# Patient Record
Sex: Female | Born: 1970 | Race: Black or African American | Hispanic: No | Marital: Single | Smoking: Never smoker
Health system: Southern US, Community
[De-identification: ages and names within clinical notes are randomized; demographics above are authoritative.]

## PROBLEM LIST (undated history)

## (undated) ENCOUNTER — Inpatient Hospital Stay: Admission: EM | Payer: Self-pay

## (undated) ENCOUNTER — Telehealth

## (undated) ENCOUNTER — Encounter

## (undated) ENCOUNTER — Encounter
Payer: MEDICARE | Attending: Rehabilitative and Restorative Service Providers" | Primary: Rehabilitative and Restorative Service Providers"

## (undated) ENCOUNTER — Encounter
Attending: Student in an Organized Health Care Education/Training Program | Primary: Student in an Organized Health Care Education/Training Program

## (undated) ENCOUNTER — Ambulatory Visit: Payer: Medicare (Managed Care) | Attending: Neurology | Primary: Neurology

## (undated) ENCOUNTER — Telehealth: Attending: Clinical | Primary: Clinical

## (undated) ENCOUNTER — Ambulatory Visit

## (undated) ENCOUNTER — Ambulatory Visit: Payer: MEDICARE

## (undated) ENCOUNTER — Encounter: Attending: Neurology | Primary: Neurology

## (undated) ENCOUNTER — Telehealth
Attending: Student in an Organized Health Care Education/Training Program | Primary: Student in an Organized Health Care Education/Training Program

## (undated) ENCOUNTER — Encounter
Attending: Pharmacist Clinician (PhC)/ Clinical Pharmacy Specialist | Primary: Pharmacist Clinician (PhC)/ Clinical Pharmacy Specialist

## (undated) ENCOUNTER — Non-Acute Institutional Stay
Payer: MEDICARE | Attending: Student in an Organized Health Care Education/Training Program | Primary: Student in an Organized Health Care Education/Training Program

## (undated) ENCOUNTER — Inpatient Hospital Stay

## (undated) ENCOUNTER — Ambulatory Visit: Attending: Mental Health | Primary: Mental Health

## (undated) ENCOUNTER — Encounter: Attending: Internal Medicine | Primary: Internal Medicine

## (undated) ENCOUNTER — Ambulatory Visit
Payer: Medicare (Managed Care) | Attending: Student in an Organized Health Care Education/Training Program | Primary: Student in an Organized Health Care Education/Training Program

## (undated) ENCOUNTER — Encounter: Attending: Clinical | Primary: Clinical

## (undated) ENCOUNTER — Other Ambulatory Visit

## (undated) ENCOUNTER — Ambulatory Visit: Payer: Medicare (Managed Care)

## (undated) ENCOUNTER — Encounter: Payer: MEDICARE | Attending: Neurology | Primary: Neurology

## (undated) ENCOUNTER — Telehealth: Attending: Nurse Practitioner | Primary: Nurse Practitioner

## (undated) ENCOUNTER — Encounter: Attending: Physician Assistant | Primary: Physician Assistant

## (undated) ENCOUNTER — Encounter: Attending: Urology | Primary: Urology

## (undated) ENCOUNTER — Encounter: Attending: Addiction (Substance Use Disorder) | Primary: Addiction (Substance Use Disorder)

## (undated) ENCOUNTER — Ambulatory Visit: Payer: Medicare (Managed Care) | Attending: Internal Medicine | Primary: Internal Medicine

## (undated) ENCOUNTER — Ambulatory Visit: Payer: MEDICARE | Attending: Physician Assistant | Primary: Physician Assistant

## (undated) ENCOUNTER — Ambulatory Visit: Attending: Pharmacist | Primary: Pharmacist

## (undated) ENCOUNTER — Ambulatory Visit: Attending: Addiction (Substance Use Disorder) | Primary: Addiction (Substance Use Disorder)

## (undated) ENCOUNTER — Telehealth
Attending: Pharmacist Clinician (PhC)/ Clinical Pharmacy Specialist | Primary: Pharmacist Clinician (PhC)/ Clinical Pharmacy Specialist

## (undated) ENCOUNTER — Ambulatory Visit
Payer: MEDICARE | Attending: Rehabilitative and Restorative Service Providers" | Primary: Rehabilitative and Restorative Service Providers"

## (undated) ENCOUNTER — Telehealth: Attending: Internal Medicine | Primary: Internal Medicine

## (undated) ENCOUNTER — Telehealth: Attending: Urology | Primary: Urology

## (undated) ENCOUNTER — Ambulatory Visit: Payer: MEDICARE | Attending: Medical | Primary: Medical

## (undated) DIAGNOSIS — IMO0002 Reserved for concepts with insufficient information to code with codable children: Secondary | ICD-10-CM

## (undated) DIAGNOSIS — M792 Neuralgia and neuritis, unspecified: Secondary | ICD-10-CM

## (undated) DIAGNOSIS — K592 Neurogenic bowel, not elsewhere classified: Secondary | ICD-10-CM

## (undated) DIAGNOSIS — R600 Localized edema: Secondary | ICD-10-CM

## (undated) DIAGNOSIS — N319 Neuromuscular dysfunction of bladder, unspecified: Secondary | ICD-10-CM

## (undated) DIAGNOSIS — G35 Multiple sclerosis: Secondary | ICD-10-CM

## (undated) DIAGNOSIS — K439 Ventral hernia without obstruction or gangrene: Secondary | ICD-10-CM

---

## 1999-09-27 HISTORY — PX: ABDOMINAL HYSTERECTOMY: SHX81

## 2002-09-26 HISTORY — PX: BACK SURGERY: SHX140

## 2002-10-29 ENCOUNTER — Encounter: Admission: RE | Admit: 2002-10-29 | Discharge: 2002-10-29 | Payer: Self-pay | Admitting: Neurosurgery

## 2002-10-29 ENCOUNTER — Encounter: Payer: Self-pay | Admitting: Neurosurgery

## 2002-12-18 ENCOUNTER — Inpatient Hospital Stay (HOSPITAL_COMMUNITY): Admission: RE | Admit: 2002-12-18 | Discharge: 2002-12-21 | Payer: Self-pay | Admitting: Neurosurgery

## 2002-12-18 ENCOUNTER — Encounter: Payer: Self-pay | Admitting: Neurosurgery

## 2003-01-23 ENCOUNTER — Encounter: Payer: Self-pay | Admitting: Neurosurgery

## 2003-01-23 ENCOUNTER — Encounter: Admission: RE | Admit: 2003-01-23 | Discharge: 2003-01-23 | Payer: Self-pay | Admitting: Neurosurgery

## 2003-02-11 ENCOUNTER — Encounter: Admission: RE | Admit: 2003-02-11 | Discharge: 2003-02-11 | Payer: Self-pay | Admitting: Neurosurgery

## 2003-02-11 ENCOUNTER — Encounter: Payer: Self-pay | Admitting: Neurosurgery

## 2003-04-30 ENCOUNTER — Encounter: Admission: RE | Admit: 2003-04-30 | Discharge: 2003-04-30 | Payer: Self-pay | Admitting: Neurosurgery

## 2003-04-30 ENCOUNTER — Encounter: Payer: Self-pay | Admitting: Neurosurgery

## 2003-09-27 DIAGNOSIS — G35 Multiple sclerosis: Secondary | ICD-10-CM

## 2003-09-27 HISTORY — DX: Multiple sclerosis: G35

## 2003-11-18 ENCOUNTER — Encounter: Admission: RE | Admit: 2003-11-18 | Discharge: 2003-11-18 | Payer: Self-pay | Admitting: Neurosurgery

## 2003-12-11 ENCOUNTER — Ambulatory Visit (HOSPITAL_COMMUNITY): Admission: RE | Admit: 2003-12-11 | Discharge: 2003-12-11 | Payer: Self-pay | Admitting: Neurosurgery

## 2004-05-05 ENCOUNTER — Encounter: Admission: RE | Admit: 2004-05-05 | Discharge: 2004-05-05 | Payer: Self-pay | Admitting: Neurosurgery

## 2004-07-07 ENCOUNTER — Ambulatory Visit (HOSPITAL_COMMUNITY): Admission: RE | Admit: 2004-07-07 | Discharge: 2004-07-07 | Payer: Self-pay | Admitting: Neurology

## 2004-12-05 ENCOUNTER — Emergency Department (HOSPITAL_COMMUNITY): Admission: EM | Admit: 2004-12-05 | Discharge: 2004-12-05 | Payer: Self-pay | Admitting: Emergency Medicine

## 2004-12-30 ENCOUNTER — Ambulatory Visit: Payer: Self-pay | Admitting: Neurology

## 2005-06-15 ENCOUNTER — Emergency Department: Payer: Self-pay | Admitting: Internal Medicine

## 2005-09-11 ENCOUNTER — Inpatient Hospital Stay: Payer: Self-pay

## 2005-09-24 ENCOUNTER — Inpatient Hospital Stay: Payer: Self-pay

## 2006-01-23 DIAGNOSIS — N319 Neuromuscular dysfunction of bladder, unspecified: Secondary | ICD-10-CM | POA: Diagnosis present

## 2006-01-23 HISTORY — DX: Neuromuscular dysfunction of bladder, unspecified: N31.9

## 2006-03-01 ENCOUNTER — Emergency Department: Payer: Self-pay | Admitting: Emergency Medicine

## 2006-03-15 ENCOUNTER — Inpatient Hospital Stay: Payer: Self-pay | Admitting: Internal Medicine

## 2006-03-21 ENCOUNTER — Other Ambulatory Visit: Payer: Self-pay

## 2006-03-21 ENCOUNTER — Emergency Department: Payer: Self-pay | Admitting: Unknown Physician Specialty

## 2006-04-19 ENCOUNTER — Ambulatory Visit: Payer: Self-pay | Admitting: Neurology

## 2006-09-04 ENCOUNTER — Emergency Department: Payer: Self-pay | Admitting: Emergency Medicine

## 2006-09-29 ENCOUNTER — Other Ambulatory Visit: Payer: Self-pay

## 2006-09-29 ENCOUNTER — Emergency Department: Payer: Self-pay | Admitting: Unknown Physician Specialty

## 2006-10-13 ENCOUNTER — Ambulatory Visit: Payer: Self-pay | Admitting: Neurology

## 2006-11-03 ENCOUNTER — Encounter (HOSPITAL_COMMUNITY): Admission: RE | Admit: 2006-11-03 | Discharge: 2007-02-01 | Payer: Self-pay | Admitting: Neurology

## 2007-02-22 ENCOUNTER — Encounter (HOSPITAL_COMMUNITY): Admission: RE | Admit: 2007-02-22 | Discharge: 2007-05-23 | Payer: Self-pay | Admitting: Neurology

## 2007-04-04 ENCOUNTER — Ambulatory Visit: Payer: Self-pay | Admitting: Neurology

## 2007-06-10 ENCOUNTER — Emergency Department: Payer: Self-pay | Admitting: Emergency Medicine

## 2007-06-14 ENCOUNTER — Encounter (HOSPITAL_COMMUNITY): Admission: RE | Admit: 2007-06-14 | Discharge: 2007-09-12 | Payer: Self-pay | Admitting: Neurology

## 2007-07-26 IMAGING — CT CT HEAD WITHOUT CONTRAST
1 series · 16 of 30 positions shown, 20 images · non-contrast
Comparison: none

REASON FOR EXAM: Weakness left arm and leg, ms
COMMENTS:

PROCEDURE:     CT  - CT HEAD WITHOUT CONTRAST  - September 29, 2006  [DATE]
RESULT:     Unenhanced, emergent head CT is performed for LEFT leg weakness.

[Series 2: soft tissue · axial · 0.41mm/px · z∈[+336,+470]mm · 16 of 31 slices shown, 20 images]
[im 2/31  brain]
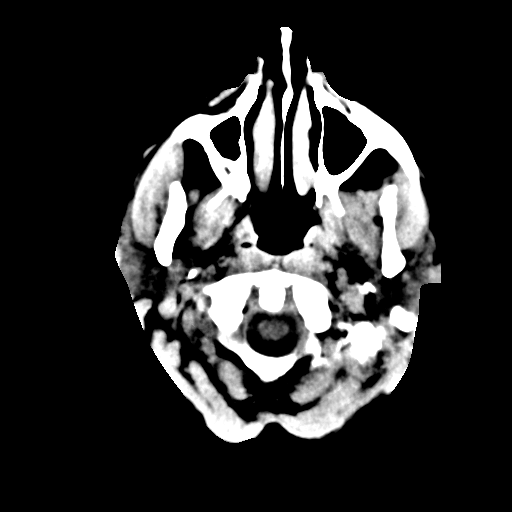
[im 2/31  bone]
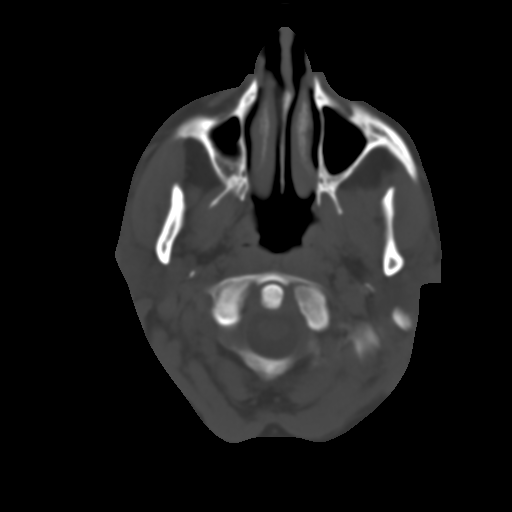
[im 4/31  brain]
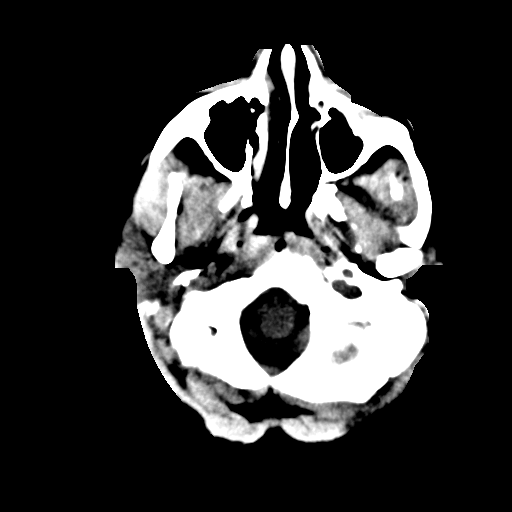
[im 6/31  brain]
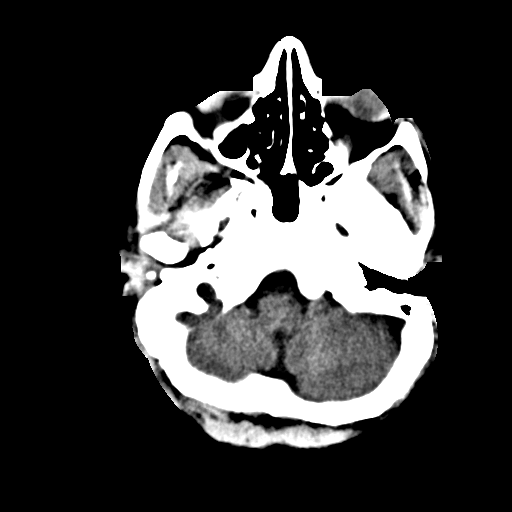
[im 8/31  brain]
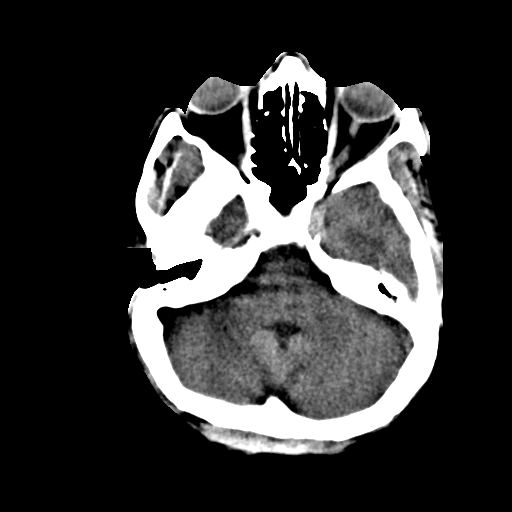
[im 9/31  brain]
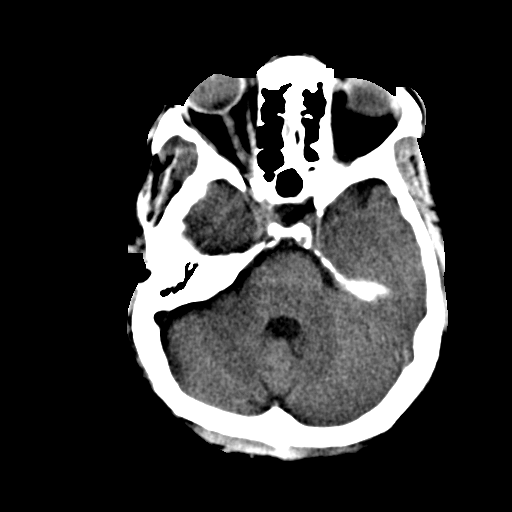
[im 9/31  bone]
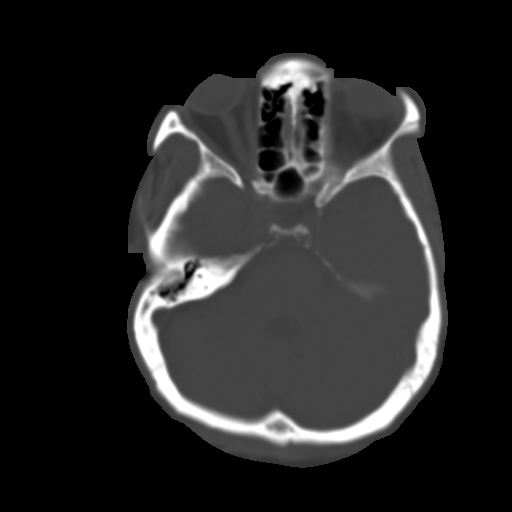
[im 11/31  brain]
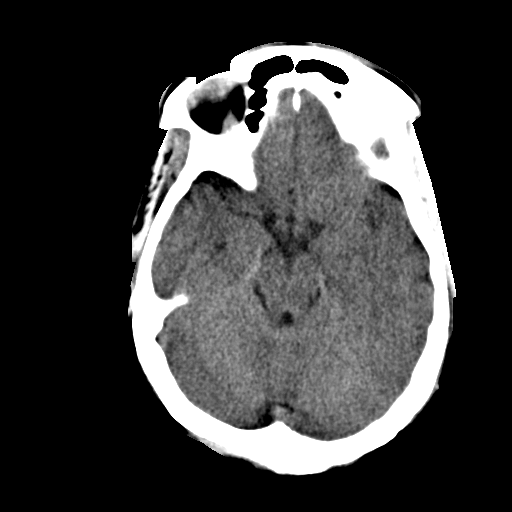
[im 13/31  brain]
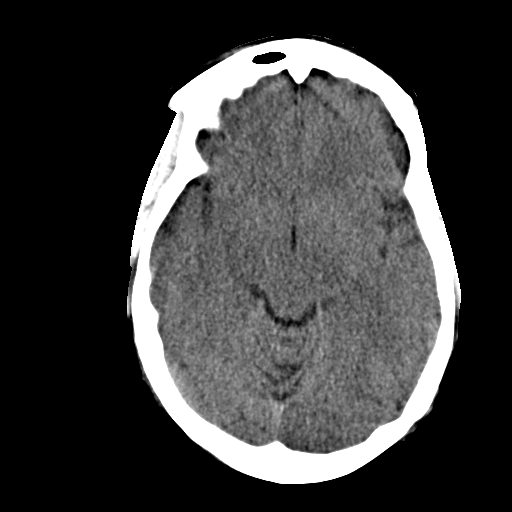
[im 15/31  brain]
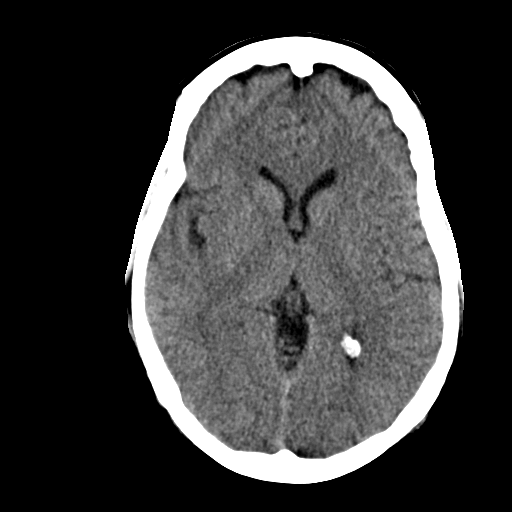
[im 16/31  brain]
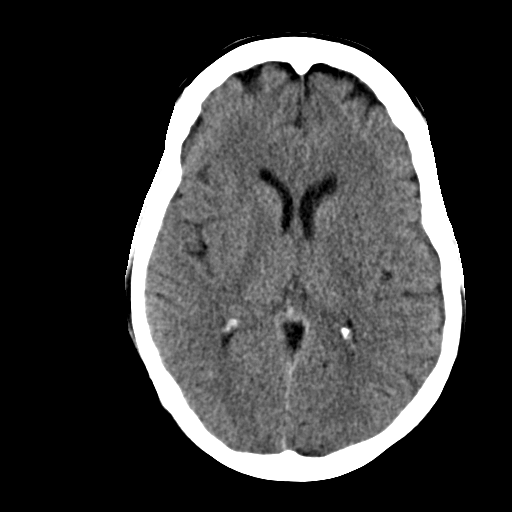
[im 16/31  bone]
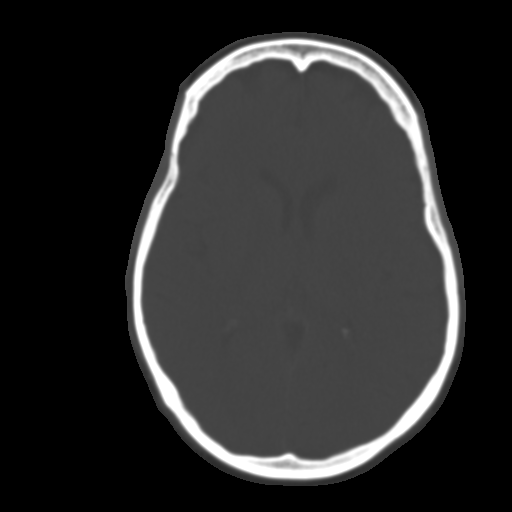
[im 18/31  brain]
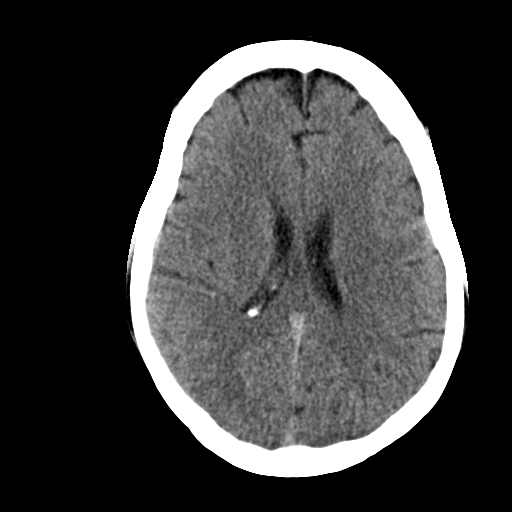
[im 20/31  brain]
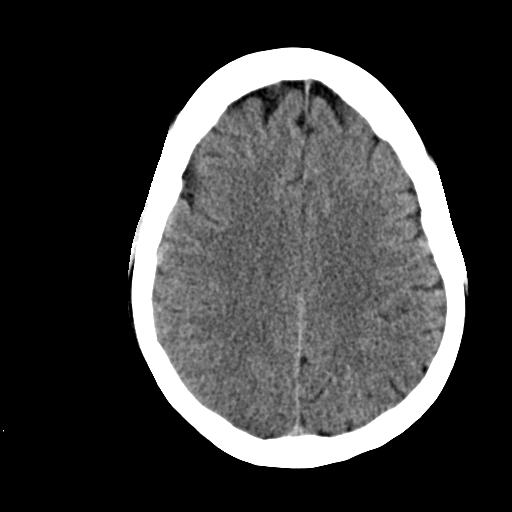
[im 22/31  brain]
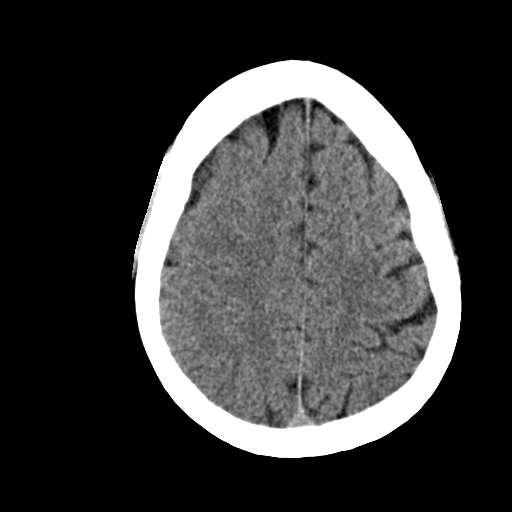
[im 23/31  brain]
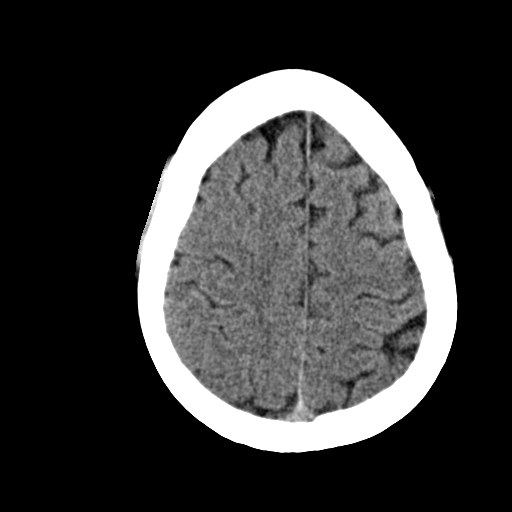
[im 23/31  bone]
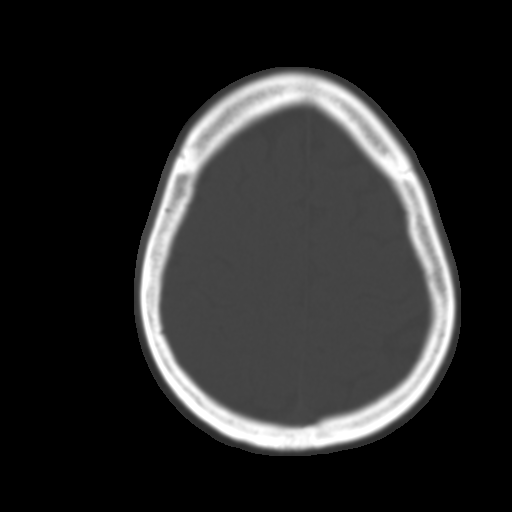
[im 25/31  brain]
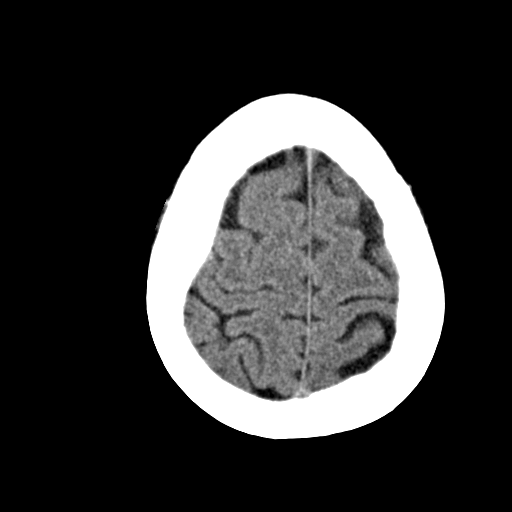
[im 27/31  brain]
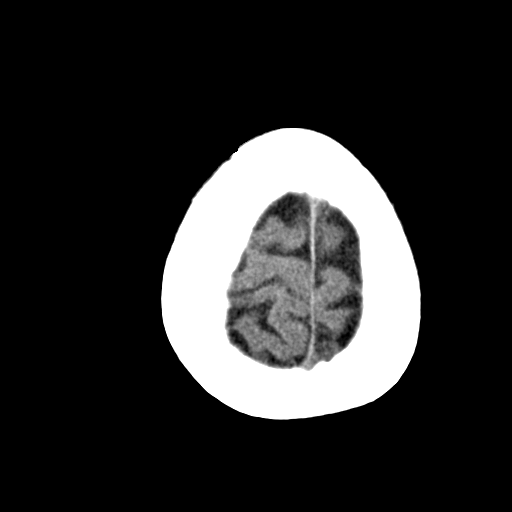
[im 29/31  brain]
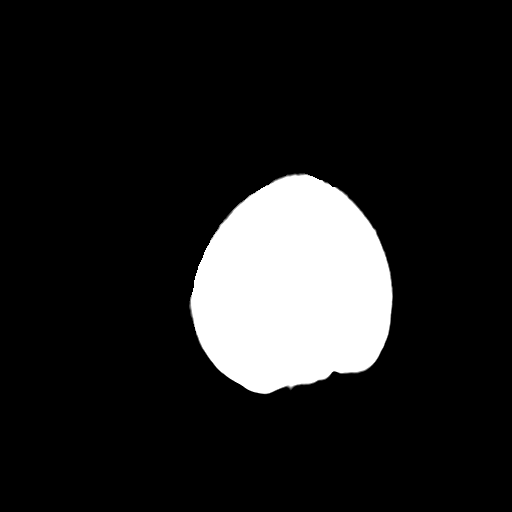

[16 of 30 positions shown; findings below may reference images not displayed]

FINDINGS: No intracerebral bleeds are noted. No infarcts or mass effect is
seen. There is no shift of the midline. The ventricles appear within normal
limits. No extra-axial fluid collections are identified.

The sinuses appear clear.
IMPRESSION: No acute abnormality is noted on the unenhanced head CT.

The report was called to the Emergency Room at the conclusion of the
dictation.

## 2007-09-13 ENCOUNTER — Encounter (HOSPITAL_COMMUNITY): Admission: RE | Admit: 2007-09-13 | Discharge: 2007-09-26 | Payer: Self-pay | Admitting: Neurology

## 2007-09-27 ENCOUNTER — Encounter (HOSPITAL_COMMUNITY): Admission: RE | Admit: 2007-09-27 | Discharge: 2007-11-24 | Payer: Self-pay | Admitting: Neurology

## 2007-10-06 ENCOUNTER — Ambulatory Visit: Payer: Self-pay | Admitting: Neurology

## 2007-11-25 ENCOUNTER — Encounter (HOSPITAL_COMMUNITY): Admission: RE | Admit: 2007-11-25 | Discharge: 2008-02-23 | Payer: Self-pay | Admitting: Family Medicine

## 2008-02-21 ENCOUNTER — Inpatient Hospital Stay: Payer: Self-pay | Admitting: Internal Medicine

## 2008-03-14 ENCOUNTER — Encounter (HOSPITAL_COMMUNITY): Admission: RE | Admit: 2008-03-14 | Discharge: 2008-06-12 | Payer: Self-pay | Admitting: Neurology

## 2008-03-26 ENCOUNTER — Ambulatory Visit: Payer: Self-pay | Admitting: Neurology

## 2008-05-28 ENCOUNTER — Ambulatory Visit (HOSPITAL_COMMUNITY): Admission: RE | Admit: 2008-05-28 | Discharge: 2008-05-28 | Payer: Self-pay | Admitting: Neurology

## 2008-07-04 ENCOUNTER — Encounter (HOSPITAL_COMMUNITY): Admission: RE | Admit: 2008-07-04 | Discharge: 2008-10-02 | Payer: Self-pay | Admitting: Neurology

## 2008-08-29 ENCOUNTER — Ambulatory Visit: Payer: Self-pay | Admitting: Neurology

## 2008-11-13 ENCOUNTER — Encounter (HOSPITAL_COMMUNITY): Admission: RE | Admit: 2008-11-13 | Discharge: 2009-02-11 | Payer: Self-pay | Admitting: Neurology

## 2008-11-28 ENCOUNTER — Ambulatory Visit (HOSPITAL_COMMUNITY): Admission: RE | Admit: 2008-11-28 | Discharge: 2008-11-28 | Payer: Self-pay | Admitting: Neurology

## 2008-12-02 ENCOUNTER — Ambulatory Visit (HOSPITAL_COMMUNITY): Admission: RE | Admit: 2008-12-02 | Discharge: 2008-12-02 | Payer: Self-pay | Admitting: Interventional Radiology

## 2009-02-12 ENCOUNTER — Encounter (HOSPITAL_COMMUNITY): Admission: RE | Admit: 2009-02-12 | Discharge: 2009-05-13 | Payer: Self-pay | Admitting: Neurology

## 2009-02-25 ENCOUNTER — Encounter: Admission: RE | Admit: 2009-02-25 | Discharge: 2009-02-25 | Payer: Self-pay | Admitting: Neurology

## 2009-03-10 ENCOUNTER — Ambulatory Visit (HOSPITAL_COMMUNITY): Admission: RE | Admit: 2009-03-10 | Discharge: 2009-03-10 | Payer: Self-pay | Admitting: General Surgery

## 2009-04-03 ENCOUNTER — Ambulatory Visit (HOSPITAL_COMMUNITY): Admission: RE | Admit: 2009-04-03 | Discharge: 2009-04-03 | Payer: Self-pay | Admitting: General Surgery

## 2009-05-21 ENCOUNTER — Encounter (HOSPITAL_COMMUNITY): Admission: RE | Admit: 2009-05-21 | Discharge: 2009-08-19 | Payer: Self-pay | Admitting: Neurology

## 2009-08-21 ENCOUNTER — Encounter (HOSPITAL_COMMUNITY): Admission: RE | Admit: 2009-08-21 | Discharge: 2009-09-25 | Payer: Self-pay | Admitting: Neurology

## 2009-09-11 ENCOUNTER — Ambulatory Visit: Payer: Self-pay | Admitting: Neurology

## 2009-10-22 ENCOUNTER — Encounter (HOSPITAL_COMMUNITY): Admission: RE | Admit: 2009-10-22 | Discharge: 2010-01-20 | Payer: Self-pay | Admitting: Neurology

## 2010-02-04 ENCOUNTER — Encounter (HOSPITAL_COMMUNITY): Admission: RE | Admit: 2010-02-04 | Discharge: 2010-05-05 | Payer: Self-pay | Admitting: Neurology

## 2010-04-22 ENCOUNTER — Ambulatory Visit: Payer: Self-pay | Admitting: Neurology

## 2010-05-28 ENCOUNTER — Encounter (HOSPITAL_COMMUNITY)
Admission: RE | Admit: 2010-05-28 | Discharge: 2010-08-26 | Payer: Self-pay | Source: Home / Self Care | Admitting: Neurology

## 2010-08-30 ENCOUNTER — Encounter (HOSPITAL_COMMUNITY)
Admission: RE | Admit: 2010-08-30 | Discharge: 2010-10-26 | Payer: Self-pay | Source: Home / Self Care | Attending: Neurology | Admitting: Neurology

## 2010-09-26 ENCOUNTER — Emergency Department: Payer: Self-pay | Admitting: Emergency Medicine

## 2010-11-04 ENCOUNTER — Encounter (HOSPITAL_COMMUNITY): Payer: Medicare Other | Attending: Neurology

## 2010-11-04 DIAGNOSIS — G35 Multiple sclerosis: Secondary | ICD-10-CM | POA: Insufficient documentation

## 2010-12-07 ENCOUNTER — Encounter (HOSPITAL_COMMUNITY): Payer: Medicare Other | Attending: Neurology

## 2010-12-07 DIAGNOSIS — G35 Multiple sclerosis: Secondary | ICD-10-CM | POA: Insufficient documentation

## 2011-01-02 LAB — BASIC METABOLIC PANEL
BUN: 14 mg/dL (ref 6–23)
CO2: 27 mEq/L (ref 19–32)
Calcium: 9.2 mg/dL (ref 8.4–10.5)
Chloride: 107 mEq/L (ref 96–112)
Creatinine, Ser: 0.69 mg/dL (ref 0.4–1.2)
GFR calc Af Amer: >60 mL/min (ref >60)
GFR calc non Af Amer: >60 mL/min (ref >60)
Glucose, Bld: 91 mg/dL (ref 70–99)
Potassium: 4.2 mEq/L (ref 3.5–5.1)
Sodium: 140 mEq/L (ref 135–145)

## 2011-01-02 LAB — HEMOGLOBIN AND HEMATOCRIT, BLOOD
HCT: 39.6 % (ref 36.0–46.0)
Hemoglobin: 13.2 g/dL (ref 12.0–15.0)

## 2011-01-03 LAB — CBC
HCT: 40 % (ref 36.0–46.0)
Hemoglobin: 13.7 g/dL (ref 12.0–15.0)
MCHC: 34.3 g/dL (ref 30.0–36.0)
MCV: 88.4 fL (ref 78.0–100.0)
Platelets: 282 10*3/uL (ref 150–400)
RBC: 4.52 MIL/uL (ref 3.87–5.11)
RDW: 13.6 % (ref 11.5–15.5)
WBC: 10.9 10*3/uL — ABNORMAL HIGH (ref 4.0–10.5)

## 2011-01-03 LAB — CULTURE, BLOOD (SINGLE)

## 2011-01-04 ENCOUNTER — Encounter (HOSPITAL_COMMUNITY)
Admission: RE | Admit: 2011-01-04 | Discharge: 2011-01-04 | Disposition: A | Discharge status: Home / Self Care | Payer: Medicare Other | Source: Ambulatory Visit | Attending: Neurology | Admitting: Neurology

## 2011-01-04 DIAGNOSIS — G35 Multiple sclerosis: Secondary | ICD-10-CM | POA: Insufficient documentation

## 2011-01-04 LAB — CBC
HCT: 37.5 % (ref 36.0–46.0)
MCV: 88.4 fL (ref 78.0–100.0)
Platelets: 253 10*3/uL (ref 150–400)
RBC: 4.24 MIL/uL (ref 3.87–5.11)
WBC: 8.6 10*3/uL (ref 4.0–10.5)

## 2011-01-04 LAB — DIFFERENTIAL
Basophils Absolute: 0.1 10*3/uL (ref 0.0–0.1)
Basophils Relative: 1 % (ref 0–1)
Eosinophils Absolute: 0.2 10*3/uL (ref 0.0–0.7)
Eosinophils Relative: 2 % (ref 0–5)
Monocytes Absolute: 1.1 10*3/uL — ABNORMAL HIGH (ref 0.1–1.0)

## 2011-01-04 LAB — CULTURE, BLOOD (ROUTINE X 2): Culture: NO GROWTH

## 2011-01-04 LAB — COMPREHENSIVE METABOLIC PANEL
AST: 20 U/L (ref 0–37)
Albumin: 3.6 g/dL (ref 3.5–5.2)
Alkaline Phosphatase: 43 U/L (ref 39–117)
BUN: 9 mg/dL (ref 6–23)
CO2: 26 mEq/L (ref 19–32)
Chloride: 107 mEq/L (ref 96–112)
GFR calc Af Amer: >60 mL/min (ref >60)
GFR calc non Af Amer: >60 mL/min (ref >60)
Potassium: 3.5 mEq/L (ref 3.5–5.1)
Total Bilirubin: 0.6 mg/dL (ref 0.3–1.2)

## 2011-01-06 LAB — APTT: aPTT: 29 seconds (ref 24–37)

## 2011-01-06 LAB — PROTIME-INR: INR: 1 (ref 0.00–1.49)

## 2011-02-01 ENCOUNTER — Encounter (HOSPITAL_COMMUNITY): Payer: Medicare Other

## 2011-02-08 NOTE — Op Note (Signed)
[name], [name]              ACCOUNT NO.:  [id number]   MEDICAL RECORD NO.:  [id number]          PATIENT TYPE:  AMB   LOCATION:  DAY                          FACILITY:  [facility]   PHYSICIAN:  Lennie Muckle, MD      DATE OF BIRTH:  11-28-1970   DATE OF PROCEDURE:  04/03/2009  DATE OF DISCHARGE:                               OPERATIVE REPORT   PREOPERATIVE DIAGNOSIS:  Multiple sclerosis needing port access for  therapy.   POSTOPERATIVE DIAGNOSIS:  Multiple sclerosis needing port access for  therapy.   PROCEDURE:  Port placement.   SURGEON:  Lennie Muckle, MD   ASSISTANT:  OR technician.   ANESTHESIA:  General endotracheal anesthesia.   FINDINGS:  Catheter and left subclavian tip in left atrium.  No  immediate complications.  A Port-A-Cath was placed without difficulty.   ESTIMATED BLOOD LOSS:  None.   COMPLICATIONS:  None immediate.   INDICATIONS FOR PROCEDURE:  [name] is a 40 year old female with  multiple sclerosis receiving IV infusions for treatment.  She had had  infection of her previous port which had to be removed.  She had  followup blood cultures which were negative for any bacteria.  I had  talked to her about placing the port on the opposite side.  Risk of  surgery were explained to the patient.  Informed consent was obtained  prior to procedure.   DETAILS OF THE PROCEDURE:  [name] was identified in the preoperative  holding area.  She received a gram of IV Kefzol.  She was then taken to  the operating room and once in the operating room placed in a supine  position.  SCDs were applied to her lower extremities.  She was placed  under general tracheal anesthesia.  Her arms were tucked and she had a  towel roll between her shoulder blades.  Her anterior chest wall right  and left side were then prepped and draped in the usual sterile fashion.  A time-out procedure of the patient and procedure were performed.  Using  the Seldinger technique I accessed  the left subclavian with the seeker  needle.  Thread the wire with fluoroscopy in position in the heart.  I  then used dilator technique and introduced the catheter into the  subclavian.  This was positioned in the heart.  Retracted distally until  the tip was in the right atrium.  I then created a pocket in the  anterior chest wall using electrocautery after cutting the skin with a  #15 blade.  I then cut the catheter down to size.  I tunneled this  through subcutaneous tissues to the port.  Final x-ray revealed the tip  at the atrium.  I  then secured the port in place with 2-0 Prolene suture, closed in  layered fashion using 3-0 Vicryl suture.  The patient had Dermabond  placed for final dressing.  She will have a chest x-ray in the recovery  unit and be discharged home.  Will follow up in approximately two weeks'  time.      Amber Burna Mortimer,  MD  Electronically Signed     ALA/MEDQ  D:  04/03/2009  T:  04/03/2009  Job:  [id number]   cc:   Casimiro Needle L. Thad Ranger, M.D.  Fax: [phone]

## 2011-02-11 NOTE — Op Note (Signed)
NAMEKIMBERLIN, [name]                          ACCOUNT NO.:  [id number]   MEDICAL RECORD NO.:  [id number]                   PATIENT TYPE:  OIB   LOCATION:  3172                                 FACILITY:  [facility]   PHYSICIAN:  Kathaleen Maser. Pool, M.D.                 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   PROCEDURE:  L5-S1 bilateral microdiskectomies with posterior lumbar  interbody fusion utilizing Tangent wedges and local autograft, coupled with  posterolateral fusion utilizing pedicle screw fixation and local autograft.   SURGEON:  Kathaleen Maser. Pool, M.D.   ASSISTANT:  Donalee Citrin, M.D.   ANESTHESIA:  General endotracheal.   INDICATIONS:  The patient is a 40 year old female with history of severe  back and bilateral lower extremity pain failing all conservative management.  MRI scan demonstrates evidence of a central L5-S1 disk herniation with  significant disk degeneration.  Discography reveals a concordant pain  response at this level and negative response at all other levels.  The  patient has been counseled as to her options.  She has decided to proceed  with an L5-S1 decompression and fusion with instrumentation for hopeful  improvement of her symptoms.   DESCRIPTION OF PROCEDURE:  The patient was taken to the operating room and  placed on the table in a supine position.  After an adequate level of  anesthesia was achieved, the patient was positioned prone onto the Wilson  frame and appropriately padded.  The patient's lumbar region was prepped and  draped sterilely.  A 10 blade was used to make a linear skin incision  overlying the L5-S1 interspace.  This was carried  down sharply in the  midline.  Subperiosteal dissection was then performed, exposing the laminae  and facet joints at L5 and S1 bilaterally as well as the transverse  processes of L5 and the alae of the sacrum bilaterally.  Deep self-retaining  retractor was placed, intraoperative fluoroscopy was used, and the levels  were confirmed.  A complete laminectomy at L5 was then performed using  Kerrison rongeurs, Leksell rongeurs, and a high-speed drill to remove the  entire lamina of L5 and the entire inferior facet complex of L5 bilaterally  and the majority of the superior facet complex of S1 bilaterally.  Ligamentum flavum was elevated and resected in piecemeal fashion, as was the  superior rim of the S1 lamina.  Underlying thecal sac and exiting L5 and S1  nerve roots were identified.  Wide foraminotomies were performed along the  course of the exiting nerve roots.  Epidural venous plexus was coagulated  and cut.  Starting first  at the patient's left side, the thecal sac and  nerve roots were mobilized and retracted toward the midline.  The disk space  was readily apparent.  This was incised with a 15 blade in a rectangular  fashion.  A wide disk space clean-out was then achieved using pituitary  rongeurs, upward-angled pituitary rongeurs, and Epstein curettes.  A large  central disk herniation was encountered and completely resected.  The  procedure then repeated on the contralateral side, again without  complications.  The disk space was then sequentially dilated up to 10 mm  with a 10 mm distractor left in the patient's left side.  With a nerve root  protector on the patient's right side, the disk space was then cut with a 10  mm box cutter and then cut with a 10 mm Tangent chisel.  Soft tissue was  removed from the interspace.  A 10 x 24 mm Tangent wedge was then impacted  in place, recessed approximately 1 mm from the posterior cortical margin.  The procedure was then repeated on the  contralateral side, again without  complication.  Prior to installation of the second wedge, the disk space was  further curetted and morcellized autograft was packed in the interspace.  A  second wedge was then impacted into place and again recessed approximately 1  mm from the posterior cortical margin.  Pedicles at L5 and S1 were then  isolated using surface landmarks and intraoperative fluoroscopy.  Superficial bone overlying the pedicles was removed using the high-speed  drill.  Each pedicle was then probed using a pedicle awl.  Each pedicle awl  track was then tapped with the 5.25 mm screw tap.  Each screw tap hole was  found to be solidly within bone by probing with the blunt probe.  Each  pedicle screw was then placed.  Spiral 90 6.75 x 35 mm screws placed  bilaterally at L5, 6.75 x 30 mm Spiral 90 screws were placed bilaterally at  S1.  Transverse processes and the sacral alae were then decorticated using  the high-speed drill.  Morcellized autograft was packed posterolaterally.  A  short segment of titanium rod was then contoured and placed over the screw  heads at L5 and S1.  The locking caps were then placed onto the screw heads.  The locking caps were then engaged in a sequential fashion with the  construct under compression.  Final images revealed good position of bone  grafts, hardware, proper operative level, with normal alignment of the  spine.  A blunt probe was passed easily along the course of each exiting  nerve root.  There was no evidence of injury to thecal sac or nerve roots.  Gelfoam was placed topically for hemostasis.  A medium Hemovac drain was  left in the epidural space.  The wound was then closed in layers with Vicryl  sutures.  Steri-Strips and sterile dressing were applied.  There were no  apparent complications.  The patient tolerated the procedure well and she  returns to the recovery room postop.                                              Henry A.  Pool, M.D.    HAP/MEDQ  D:  12/18/2002  T:  12/18/2002  Job:  [id number]

## 2011-02-11 NOTE — Discharge Summary (Signed)
   [name], [name]                          ACCOUNT NO.:  [id number]   MEDICAL RECORD NO.:  [id number]                   PATIENT TYPE:  INP   LOCATION:  3006                                 FACILITY:  [facility]   PHYSICIAN:  Kathaleen Maser. Pool, M.D.                 DATE OF BIRTH:  [birth year]   DATE OF ADMISSION:  12/18/2002  DATE OF DISCHARGE:  12/21/2002                                 DISCHARGE SUMMARY   FINAL DIAGNOSIS:  L5-S1 central herniated nucleus pulposis with severe  degenerative disk disease.   OPERATIONS AND TREATMENTS:  L5-S1 decompression and fusion with  instrumentation.   HISTORY OF PRESENT ILLNESS:  The patient is a 40 year old female with a  history of severe back pain and lower extremity symptoms, failing all  conservative management.  Work-up has demonstrated evidence of a large  central disk herniation with severe disk degeneration of L5-S1.  She  presents now for decompression and fusion surgery at L5-S1.   HOSPITAL COURSE:  The patient was taken to the operating room where an  uncomplicated L5-S1 decompression and fusion were performed.  Postoperatively, the patient did well.  The patient had complete resolution  of her lower extremity pain.  Back pain was improved.  The wound was healing  well.  The patient is tolerating a regular diet.  She did have some  difficulty with postoperative atelectasis which eventually cleared.   DISPOSITION:  On the third postoperative day, she was afebrile and ready for  discharge home.   CONDITION ON DISCHARGE:  Improved.   FOLLOW-UP:  Discharge follow-up is in one week in my office.                                               Henry A. Pool, M.D.    HAP/MEDQ  D:  02/05/2003  T:  02/06/2003  Job:  [id number]

## 2011-02-23 ENCOUNTER — Emergency Department: Payer: Self-pay | Admitting: Emergency Medicine

## 2011-03-01 ENCOUNTER — Other Ambulatory Visit (HOSPITAL_COMMUNITY): Payer: Self-pay | Admitting: Neurology

## 2011-03-01 ENCOUNTER — Ambulatory Visit (HOSPITAL_COMMUNITY): Payer: Medicare Other

## 2011-03-01 DIAGNOSIS — G35 Multiple sclerosis: Secondary | ICD-10-CM

## 2011-03-08 ENCOUNTER — Other Ambulatory Visit (HOSPITAL_COMMUNITY): Payer: Medicare Other

## 2011-03-08 ENCOUNTER — Ambulatory Visit (HOSPITAL_COMMUNITY)
Admission: RE | Admit: 2011-03-08 | Disposition: A | Discharge status: Home / Self Care | Payer: Medicare Other | Source: Ambulatory Visit | Attending: Neurology | Admitting: Neurology

## 2011-03-08 ENCOUNTER — Ambulatory Visit (HOSPITAL_COMMUNITY)
Admission: RE | Admit: 2011-03-08 | Discharge: 2011-03-08 | Disposition: A | Discharge status: Home / Self Care | Payer: Medicare Other | Source: Ambulatory Visit | Attending: Neurology | Admitting: Neurology

## 2011-03-08 DIAGNOSIS — G35 Multiple sclerosis: Secondary | ICD-10-CM | POA: Insufficient documentation

## 2011-03-08 DIAGNOSIS — Z452 Encounter for adjustment and management of vascular access device: Secondary | ICD-10-CM | POA: Insufficient documentation

## 2011-03-08 LAB — CBC
Hemoglobin: 12.9 g/dL (ref 12.0–15.0)
MCHC: 33.2 g/dL (ref 30.0–36.0)
RBC: 4.47 MIL/uL (ref 3.87–5.11)
WBC: 5.4 10*3/uL (ref 4.0–10.5)

## 2011-05-03 ENCOUNTER — Emergency Department: Payer: Self-pay | Admitting: *Deleted

## 2011-06-29 LAB — CBC
HCT: 38
Hemoglobin: 12.8
MCV: 89.4
RBC: 4.26
WBC: 10.7 — ABNORMAL HIGH

## 2012-01-10 ENCOUNTER — Encounter: Payer: Self-pay | Admitting: Physical Medicine and Rehabilitation

## 2012-01-25 ENCOUNTER — Encounter: Payer: Self-pay | Admitting: Physical Medicine and Rehabilitation

## 2012-01-27 ENCOUNTER — Emergency Department: Payer: Self-pay | Admitting: Emergency Medicine

## 2012-01-27 LAB — COMPREHENSIVE METABOLIC PANEL
Albumin: 3.4 g/dL (ref 3.4–5.0)
Alkaline Phosphatase: 40 U/L — ABNORMAL LOW (ref 50–136)
Anion Gap: 7 (ref 7–16)
Bilirubin,Total: 0.3 mg/dL (ref 0.2–1.0)
Calcium, Total: 8.8 mg/dL (ref 8.5–10.1)
Creatinine: 0.46 mg/dL — ABNORMAL LOW (ref 0.60–1.30)
EGFR (African American): >60
EGFR (Non-African Amer.): >60
Glucose: 82 mg/dL (ref 65–99)
SGPT (ALT): 17 U/L
Sodium: 138 mmol/L (ref 136–145)
Total Protein: 7.9 g/dL (ref 6.4–8.2)

## 2012-01-27 LAB — URINALYSIS, COMPLETE
Bacteria: NONE SEEN
Bilirubin,UR: NEGATIVE
Nitrite: POSITIVE
Specific Gravity: 1.005 (ref 1.003–1.030)
Squamous Epithelial: NONE SEEN

## 2012-01-27 LAB — CBC
MCH: 30.4 pg (ref 26.0–34.0)
MCV: 93 fL (ref 80–100)
Platelet: 282 10*3/uL (ref 150–440)

## 2012-02-25 ENCOUNTER — Encounter: Payer: Self-pay | Admitting: Physical Medicine and Rehabilitation

## 2012-03-06 ENCOUNTER — Emergency Department: Payer: Self-pay | Admitting: Unknown Physician Specialty

## 2012-03-06 LAB — URINALYSIS, COMPLETE
Bilirubin,UR: NEGATIVE
Glucose,UR: NEGATIVE mg/dL (ref 0–75)
Nitrite: NEGATIVE
Ph: 8 (ref 4.5–8.0)
Specific Gravity: 1.005 (ref 1.003–1.030)
Squamous Epithelial: NONE SEEN

## 2012-03-07 LAB — URINE CULTURE

## 2012-03-26 ENCOUNTER — Encounter: Payer: Self-pay | Admitting: Physical Medicine and Rehabilitation

## 2012-04-26 ENCOUNTER — Encounter: Payer: Self-pay | Admitting: Physical Medicine and Rehabilitation

## 2012-06-05 ENCOUNTER — Emergency Department: Payer: Self-pay | Admitting: Emergency Medicine

## 2012-06-05 LAB — COMPREHENSIVE METABOLIC PANEL
BUN: 6 mg/dL — ABNORMAL LOW (ref 7–18)
Bilirubin,Total: 0.6 mg/dL (ref 0.2–1.0)
Chloride: 107 mmol/L (ref 98–107)
Creatinine: 0.54 mg/dL — ABNORMAL LOW (ref 0.60–1.30)
EGFR (African American): >60
Osmolality: 276 (ref 275–301)
Potassium: 3.6 mmol/L (ref 3.5–5.1)
SGPT (ALT): 11 U/L — ABNORMAL LOW (ref 12–78)
Sodium: 140 mmol/L (ref 136–145)
Total Protein: 7.6 g/dL (ref 6.4–8.2)

## 2012-06-05 LAB — URINALYSIS, COMPLETE
Ketone: NEGATIVE
Ph: 7 (ref 4.5–8.0)
Specific Gravity: 1.004 (ref 1.003–1.030)
Squamous Epithelial: NONE SEEN

## 2012-06-05 LAB — CBC
Platelet: 210 10*3/uL (ref 150–440)
RBC: 4.4 10*6/uL (ref 3.80–5.20)
WBC: 8.1 10*3/uL (ref 3.6–11.0)

## 2012-06-10 LAB — CULTURE, BLOOD (SINGLE)

## 2012-06-26 HISTORY — PX: REVISION UROSTOMY CUTANEOUS: SUR1282

## 2012-08-12 DIAGNOSIS — K592 Neurogenic bowel, not elsewhere classified: Secondary | ICD-10-CM

## 2012-08-12 HISTORY — DX: Neurogenic bowel, not elsewhere classified: K59.2

## 2012-09-17 ENCOUNTER — Observation Stay: Payer: Self-pay | Admitting: Internal Medicine

## 2012-09-17 LAB — COMPREHENSIVE METABOLIC PANEL
Alkaline Phosphatase: 50 U/L (ref 50–136)
BUN: 12 mg/dL (ref 7–18)
Chloride: 109 mmol/L — ABNORMAL HIGH (ref 98–107)
Co2: 26 mmol/L (ref 21–32)
EGFR (African American): >60
EGFR (Non-African Amer.): >60
SGOT(AST): 16 U/L (ref 15–37)
SGPT (ALT): 12 U/L (ref 12–78)

## 2012-09-17 LAB — CBC WITH DIFFERENTIAL/PLATELET
Basophil #: 0.1 10*3/uL (ref 0.0–0.1)
Eosinophil #: 0.1 10*3/uL (ref 0.0–0.7)
HCT: 39.9 % (ref 35.0–47.0)
Lymphocyte #: 1.8 10*3/uL (ref 1.0–3.6)
MCH: 29.6 pg (ref 26.0–34.0)
MCHC: 33.1 g/dL (ref 32.0–36.0)
MCV: 89 fL (ref 80–100)
Monocyte %: 11.3 %
Neutrophil #: 5.9 10*3/uL (ref 1.4–6.5)
RDW: 13.7 % (ref 11.5–14.5)

## 2012-09-17 LAB — URINALYSIS, COMPLETE
Bilirubin,UR: NEGATIVE
Leukocyte Esterase: NEGATIVE
Nitrite: NEGATIVE
Ph: 6 (ref 4.5–8.0)
RBC,UR: 7 /HPF (ref 0–5)
Squamous Epithelial: <1

## 2012-09-18 LAB — CBC WITH DIFFERENTIAL/PLATELET
Eosinophil %: 1.6 %
Lymphocyte #: 2.7 10*3/uL (ref 1.0–3.6)
MCH: 29.4 pg (ref 26.0–34.0)
MCV: 91 fL (ref 80–100)
Monocyte #: 0.9 x10 3/mm (ref 0.2–0.9)
Neutrophil #: 4.3 10*3/uL (ref 1.4–6.5)
Platelet: 188 10*3/uL (ref 150–440)
RBC: 3.96 10*6/uL (ref 3.80–5.20)

## 2012-09-18 LAB — BASIC METABOLIC PANEL
Anion Gap: 10 (ref 7–16)
BUN: 8 mg/dL (ref 7–18)
Calcium, Total: 8.6 mg/dL (ref 8.5–10.1)
Co2: 22 mmol/L (ref 21–32)
Creatinine: 0.4 mg/dL — ABNORMAL LOW (ref 0.60–1.30)
EGFR (African American): >60
Glucose: 75 mg/dL (ref 65–99)
Sodium: 142 mmol/L (ref 136–145)

## 2012-09-18 LAB — URINE CULTURE

## 2012-11-01 ENCOUNTER — Emergency Department: Payer: Self-pay | Admitting: Emergency Medicine

## 2012-11-01 LAB — BASIC METABOLIC PANEL
BUN: 15 mg/dL (ref 7–18)
Chloride: 106 mmol/L (ref 98–107)
Creatinine: 0.49 mg/dL — ABNORMAL LOW (ref 0.60–1.30)
EGFR (African American): >60
EGFR (Non-African Amer.): >60
Glucose: 100 mg/dL — ABNORMAL HIGH (ref 65–99)
Potassium: 3.9 mmol/L (ref 3.5–5.1)
Sodium: 138 mmol/L (ref 136–145)

## 2012-11-01 LAB — URINALYSIS, COMPLETE
Blood: NEGATIVE
Glucose,UR: NEGATIVE mg/dL (ref 0–75)
Leukocyte Esterase: NEGATIVE
Ph: 9 (ref 4.5–8.0)
Protein: 100
Specific Gravity: 1.014 (ref 1.003–1.030)
Squamous Epithelial: NONE SEEN
WBC UR: 86 /HPF (ref 0–5)

## 2012-11-06 LAB — URINE CULTURE

## 2013-01-09 DIAGNOSIS — G35 Multiple sclerosis: Secondary | ICD-10-CM

## 2013-01-09 HISTORY — DX: Multiple sclerosis: G35

## 2013-03-10 ENCOUNTER — Emergency Department: Payer: Self-pay | Admitting: Emergency Medicine

## 2013-03-10 LAB — COMPREHENSIVE METABOLIC PANEL
Albumin: 3.9 g/dL (ref 3.4–5.0)
Bilirubin,Total: 0.6 mg/dL (ref 0.2–1.0)
Chloride: 107 mmol/L (ref 98–107)
Co2: 29 mmol/L (ref 21–32)
Creatinine: 0.47 mg/dL — ABNORMAL LOW (ref 0.60–1.30)
EGFR (African American): >60
EGFR (Non-African Amer.): >60
Osmolality: 278 (ref 275–301)
SGOT(AST): 20 U/L (ref 15–37)
Sodium: 140 mmol/L (ref 136–145)

## 2013-03-10 LAB — CBC
MCH: 30.4 pg (ref 26.0–34.0)
MCV: 90 fL (ref 80–100)

## 2013-03-10 LAB — URINALYSIS, COMPLETE
Glucose,UR: NEGATIVE mg/dL (ref 0–75)
Ph: 8 (ref 4.5–8.0)
Protein: NEGATIVE

## 2014-09-05 DIAGNOSIS — K439 Ventral hernia without obstruction or gangrene: Secondary | ICD-10-CM

## 2014-09-05 DIAGNOSIS — R600 Localized edema: Secondary | ICD-10-CM | POA: Insufficient documentation

## 2014-09-05 DIAGNOSIS — E669 Obesity, unspecified: Secondary | ICD-10-CM | POA: Insufficient documentation

## 2014-09-05 HISTORY — DX: Localized edema: R60.0

## 2014-09-05 HISTORY — DX: Ventral hernia without obstruction or gangrene: K43.9

## 2014-10-05 ENCOUNTER — Emergency Department: Payer: Self-pay | Admitting: Internal Medicine

## 2014-10-05 LAB — COMPREHENSIVE METABOLIC PANEL
ALT: 13 U/L — AB
Albumin: 3.3 g/dL — ABNORMAL LOW (ref 3.4–5.0)
Alkaline Phosphatase: 54 U/L
Anion Gap: 6 — ABNORMAL LOW (ref 7–16)
BUN: 10 mg/dL (ref 7–18)
Bilirubin,Total: 0.7 mg/dL (ref 0.2–1.0)
CO2: 31 mmol/L (ref 21–32)
CREATININE: 0.73 mg/dL (ref 0.60–1.30)
Calcium, Total: 8.7 mg/dL (ref 8.5–10.1)
Chloride: 101 mmol/L (ref 98–107)
Glucose: 127 mg/dL — ABNORMAL HIGH (ref 65–99)
OSMOLALITY: 276 (ref 275–301)
Potassium: 3.6 mmol/L (ref 3.5–5.1)
SGOT(AST): 18 U/L (ref 15–37)
Sodium: 138 mmol/L (ref 136–145)
Total Protein: 7.5 g/dL (ref 6.4–8.2)

## 2014-10-05 LAB — CBC
HCT: 37.7 % (ref 35.0–47.0)
HGB: 12.6 g/dL (ref 12.0–16.0)
MCH: 30.7 pg (ref 26.0–34.0)
MCHC: 33.5 g/dL (ref 32.0–36.0)
MCV: 92 fL (ref 80–100)
Platelet: 272 10*3/uL (ref 150–440)
RBC: 4.11 10*6/uL (ref 3.80–5.20)
RDW: 13.8 % (ref 11.5–14.5)
WBC: 17.5 10*3/uL — ABNORMAL HIGH (ref 3.6–11.0)

## 2014-10-05 LAB — URINALYSIS, COMPLETE
Bilirubin,UR: NEGATIVE
GLUCOSE, UR: NEGATIVE mg/dL (ref 0–75)
KETONE: NEGATIVE
Nitrite: NEGATIVE
Ph: 7 (ref 4.5–8.0)
Protein: NEGATIVE
RBC,UR: 24 /HPF (ref 0–5)
Specific Gravity: 1.009 (ref 1.003–1.030)
Squamous Epithelial: 1

## 2014-10-09 LAB — URINE CULTURE

## 2014-10-10 LAB — CULTURE, BLOOD (SINGLE)

## 2015-01-13 NOTE — Discharge Summary (Signed)
PATIENT NAME:  [name], [name] MR#:  [id number] DATE OF BIRTH:  [birth year]  DATE OF ADMISSION:  09/17/2012 DATE OF DISCHARGE:  Possibly 09/20/2012  PRIMARY CARE PHYSICIAN: Dr. Eben Burow.  DISCHARGE DIAGNOSES:   1.  Urinary tract infection.  2.  Multiple sclerosis with weakness.   CONDITION: Stable.   CODE STATUS: Full code.   MEDICATIONS:  1.  Baclofen 20 mg p.o. 1 tablet in the morning, 1 tablet at midday, 1-1/2 tablets once a day at bedtime.  2.  Lasix 20 mg p.o. daily.  3.  Gabapentin 600 mg p.o. 4 times a day.  4.  Tylenol Extra-Strength 500 mg p.o. tablets, 1 tablet every 6 hours p.r.n.  5.  Amantadine 100 mg p.o. capsule, 2 caps once a day.  6.  Colace 100 mg p.o. t.i.d. p.r.n. for constipation.  7.  Non-formulary medication, interferon beta-1b 34.4 mcg subcutaneously q.48 hours.   DIET: Regular.  ACTIVITY: As tolerated.   FOLLOWUP CARE: Follow up with PCP within 1 to 2 weeks. Follow up with [facility].   REASON FOR ADMISSION: Weakness.   HOSPITAL COURSE: The patient is a 44 year old African American female with a history of multiple sclerosis, came to the ED for weakness and possible UTI. The patient has a urostomy bag and felt chills and generalized weakness for about 3 to 4 days. In addition, the patient has poor appetite, uses walker to walk. For detailed history and physical examination, please refer to the admission note dictated by Dr. Luberta Mutter. ED physician contacted her neurologist from [facility] who recommended to treat UTI and hold off the steroids. Laboratory data on admission date showed BUN 12, creatinine 0.39, glucose 80. Urinalysis showed UTI with WBCs 36, trace bacteria. After admission, the patient has been treated with Rocephin IVPB. The patient has weakness, underwent physical therapy. PT evaluation suggested the patient needed skilled nursing facility placement. Since the patient is living with her mother, has multiple sclerosis with weakness, her mother  cannot take care of her. The patient has no interferon 1b, but the pharmacy called me saying they tried to get interferon 1b from [facility], but there is no medication. She advised the patient to get medication online. The patient still has weakness, but clinically she is stable. She will be discharged to a skilled nursing facility possibly today. I discussed the patient's discharge plan with patient, the patient's mother, case Production designer, theatre/television/film.   TIME SPENT: About 37 minutes.    ____________________________ Shaune Pollack, MD qc:jm D: 09/20/2012 13:45:18 ET T: 09/20/2012 14:15:29 ET JOB#: [id number]  cc: Shaune Pollack, MD, <Dictator> Shaune Pollack MD ELECTRONICALLY SIGNED 09/20/2012 21:12

## 2015-01-13 NOTE — Consult Note (Signed)
PATIENT NAME:  BRANDEY, GRIESEMER MR#:  [id number] DATE OF BIRTH:  Aug 14, 1971  ADDENDUM  DATE OF CONSULTATION:  09/20/2012  PRIMARY CARE PHYSICIAN: Eben Burow, MD  CONSULTING PHYSICIAN:  Shaune Pollack, MD  According to case manager, skilled nursing home facility, [facility] cannot give patient's medication, interferon beta 1B. She has to inject herself. The patient agreed to do it herself. The patient will be discharged to [facility] today. I discussed the patient's discharge plan and interferon beta B1 issue with the case manager.     ____________________________ Shaune Pollack, MD qc:jm D: 09/20/2012 17:24:00 ET T: 09/20/2012 18:05:45 ET JOB#: [id number]  cc: Shaune Pollack, MD, <Dictator> Shaune Pollack MD ELECTRONICALLY SIGNED 09/20/2012 21:13

## 2015-01-13 NOTE — Discharge Summary (Signed)
PATIENT NAME:  [name], [name] MR#:  [id number] DATE OF BIRTH:  12/07/1970  ADDENDUM  DATE OF ADMISSION:  09/17/2012 DATE OF DISCHARGE:  09/20/2012  PRIMARY CARE PHYSICIAN: Eben Burow, MD  CONSULTING PHYSICIAN:  Shaune Pollack, MD  According to case manager, skilled nursing home facility, [facility] cannot give patient's medication, interferon beta 1B. She has to inject herself. The patient agreed to do it herself. The patient will be discharged to [facility] today. I discussed the patient's discharge plan and interferon beta B1 issue with the case manager.     ____________________________ Shaune Pollack, MD qc:jm D: 09/20/2012 17:24:00 ET T: 09/20/2012 18:05:45 ET JOB#: [id number]  cc: Shaune Pollack, MD, <Dictator> Shaune Pollack MD ELECTRONICALLY SIGNED 09/20/2012 21:13

## 2015-01-16 NOTE — H&P (Signed)
PATIENT NAME:  [name], [name] MR#:  [id number] DATE OF BIRTH:  22-Dec-1970  DATE OF ADMISSION:  09/17/2012  CHIEF COMPLAINT: Weakness.   HISTORY OF PRESENT ILLNESS: The patient is 44 year old female patient with history of multiple sclerosis, came in because of the weakness and possible urinary tract infection. The patient has urostomy bag and she felt chills and generalized weakness, which is going on for almost 3 to 4 days. The patient noted to have poor appetite. She uses a walker to walk. Since the last three days, she is not able to even walk. She states that her mother, who is old and she needed a lot of help, went to get up from the bed. Denies any fever but feels very weak. She thought she has a UTI and possible MS flare, but ER doctor has contacted her neurologist from [facility] who recommended to treat the urinary tract infection and hold off on the steroids. The patient has no visual symptoms but feels weak in the legs, mainly in the left leg due to her MS,  which is not new, and the patient has no abdominal pain. No nausea, no vomiting, no diarrhea.   PAST MEDICAL HISTORY: Has multiple sclerosis since 2005.  She took avenox and then Rebif  before and but now she is on Rebif 3 times a week.   ALLERGIES: She has allergy to latex.   SOCIAL HISTORY: No smoking. No drinking. No drugs. Lives with mother.   SURGICAL HISTORY: Significant for hysterectomy and back surgery and also had bladder removed in 2012. The patient used to get urine retention and used self cath > but during 2012, she had bladder hemorrhage, at that time her bladder was taken out and she has urostomy bag since then.  She changes it every 3 to 4 days.  Last time changed urostomy bag was today.    The patient had an MS flare this year in March; and at that time she was at [facility]. No steroids were given. She also was here in 2009.   REVIEW OF SYSTEMS:  CONSTITUTIONAL: Feels weak and fatigued.  EYES: No blurred vision.  EARS, NOSE,  AND THROAT: No tinnitus. No epistaxis. No difficulty swallowing.  RESPIRATORY: No cough.  CARDIOVASCULAR: No chest pain. No orthopnea.  GASTROINTESTINAL: Feels like suprapubic pain, poor appetite, but no nausea. Has constipation but she says yesterday and today she had no control over her bowels.  GENITOURINARY: No dysuria. The patient's urine looks clear from the urostomy bag.  ENDOCRINE: No nocturia. No thyroid problems.  HEMATOLOGIC: No history of bleeding disorder.  INTEGUMENTARY: No skin rashes.  MUSCULOSKELETAL: Back pain. The patient has limited activity because of her MS.  NEUROLOGIC: The patient feels weak in the left leg due to MS. The patient uses a walker and also has a wheelchair at home. Denies any headache. No seizures.  PSYCHIATRIC: No anxiety. The patient has no insomnia.   medicationS: The patient is on: Amantadine 100 mg 2 capsules daily, baclofen 20 mg p.o. daily, Colace 100 mg p.o. t.i.d., furosemide 20 mg p.o. daily, gabapentin 600 mg 4 times daily, and Tylenol Extra Strength 500 mg every six hours as needed for pain.   PHYSICAL EXAMINATION:  VITAL SIGNS: Temperature 98.1, heart rate 82, respirations 24, blood pressure 124/58, sats 100% on room air.  GENERAL: She is alert, awake, oriented, not in distress. Looks slightly dehydrated with dry mucous membranes.  HEAD, EYES, EARS, NOSE, AND THROAT: PERRLA.  Uvula intact. No scleral icterus.  No conjunctivitis. No hearing loss. Tympanic membranes clear.  NECK: No thyroid enlargement. No lymphadenopathy. No JVD. No carotid bruit. Normal range of motion.  RESPIRATORY: Clear to auscultation. No wheeze. No rales.  CARDIOVASCULAR: S1 and S2 regular. No murmurs. PMI not displaced. Good pedal pulses and femoral pulses. No extremity edema.  ABDOMEN: Slight suprapubic discomfort present, otherwise bowel sounds are present. No hepatosplenomegaly.  MUSCULOSKELETAL: Strength is decreased in the left leg due to MS.  On the right, it is 5/5.   The patient has no cyanosis. Gait is not tested because she is feeling weak.  SKIN: No skin rashes.  LYMPH NODES: No lymphadenopathy.  NEUROLOGIC: Cranial nerves intact. Deep tendon reflexes 2+ bilaterally. Sensations intact. No dysarthria or aphasia. Muscle power is 5/5 in upper extremities and 5/5 in the right leg but around 2/5 in the left leg.  PSYCHIATRIC: Oriented to time, place, and person.   Laboratory, diagnostic, and radiological data: Electrolytes: Sodium 141, potassium 3.8, chloride 109, bicarbonate 26, BUN 12, creatinine 0.39, glucose 80, TSH is 0.35. WBC 8.9, hemoglobin 13.2, hematocrit 39.9, platelets 263.  The patient's urinalysis looks cloudy, no leukocyte esterase, no nitrites, WBC only 36, trace bacteria.   ASSESSMENT AND PLAN:  1.  A 44 year old female patient with history of multiple sclerosis has a urostomy bag, feels weak. There is mild suprapubic pain. The patient's urine looks a little cloudy but does not look like grossly infected. She already received Rocephin in the ER, 1 gram. Admit her for overnight observation for generalized weakness and possible urinary tract infection. Continue her on Rocephin 1 gram IV daily and fluids. She already received normal saline 1 liter.  Continue normal saline at 100 mL/hour for another liter and increase the fluid intake. Follow the urine cultures and blood cultures. If they are negative, we can discharge her in the morning. The patient had generalized weakness due to multiple sclerosis.  Get the physical therapy evaluation if patient does not improve with the fluids tomorrow morning.  2.  Multiple sclerosis: The patient takes Rebif 44.5 mcg every Monday, Wednesday, and Friday.  The patient is supposed to get it today, so we are ordering one interferon shot.  The patient told me that she has normal prescription for her interferon injections and she tried to call [facility] neurologist, but she did not get any prescriptions yet.  So when she goes  home, she needs a prescription for Rebif until she gets back from her neurologist.  3.  Hold the Lasix at this time. The patient looks clinically dehydrated. Continue fluids.  4.  For muscle spasms, continue baclofen.   Time Spent ON history and physical: About 55 minutes   ____________________________ Katha Hamming, MD sk:th D: 09/17/2012 17:34:02 ET T: 09/17/2012 20:41:03 ET JOB#: [id number]  cc: Katha Hamming, MD, <Dictator> Katha Hamming MD ELECTRONICALLY SIGNED 10/21/2012 22:47

## 2015-04-29 DIAGNOSIS — IMO0002 Reserved for concepts with insufficient information to code with codable children: Secondary | ICD-10-CM

## 2015-04-29 DIAGNOSIS — Z9889 Other specified postprocedural states: Secondary | ICD-10-CM | POA: Insufficient documentation

## 2015-04-29 HISTORY — DX: Reserved for concepts with insufficient information to code with codable children: IMO0002

## 2015-05-21 DIAGNOSIS — M792 Neuralgia and neuritis, unspecified: Secondary | ICD-10-CM

## 2015-05-21 HISTORY — DX: Neuralgia and neuritis, unspecified: M79.2

## 2015-07-19 ENCOUNTER — Inpatient Hospital Stay
Admission: EM | Admit: 2015-07-19 | Discharge: 2015-07-25 | DRG: 389 | Disposition: A | Discharge status: Home / Self Care | Payer: Medicare Other | Attending: Surgery | Admitting: Surgery

## 2015-07-19 ENCOUNTER — Inpatient Hospital Stay: Payer: Medicare Other

## 2015-07-19 ENCOUNTER — Emergency Department: Payer: Medicare Other

## 2015-07-19 ENCOUNTER — Encounter: Payer: Self-pay | Admitting: Emergency Medicine

## 2015-07-19 DIAGNOSIS — K5669 Other intestinal obstruction: Secondary | ICD-10-CM

## 2015-07-19 DIAGNOSIS — Z906 Acquired absence of other parts of urinary tract: Secondary | ICD-10-CM

## 2015-07-19 DIAGNOSIS — Z932 Ileostomy status: Secondary | ICD-10-CM

## 2015-07-19 DIAGNOSIS — Z936 Other artificial openings of urinary tract status: Secondary | ICD-10-CM

## 2015-07-19 DIAGNOSIS — Z79899 Other long term (current) drug therapy: Secondary | ICD-10-CM

## 2015-07-19 DIAGNOSIS — K56609 Unspecified intestinal obstruction, unspecified as to partial versus complete obstruction: Secondary | ICD-10-CM | POA: Diagnosis present

## 2015-07-19 DIAGNOSIS — Z23 Encounter for immunization: Secondary | ICD-10-CM | POA: Diagnosis not present

## 2015-07-19 DIAGNOSIS — G35 Multiple sclerosis: Secondary | ICD-10-CM

## 2015-07-19 DIAGNOSIS — Z91040 Latex allergy status: Secondary | ICD-10-CM | POA: Diagnosis not present

## 2015-07-19 DIAGNOSIS — N390 Urinary tract infection, site not specified: Secondary | ICD-10-CM | POA: Diagnosis present

## 2015-07-19 DIAGNOSIS — Z90710 Acquired absence of both cervix and uterus: Secondary | ICD-10-CM | POA: Diagnosis not present

## 2015-07-19 DIAGNOSIS — K433 Parastomal hernia with obstruction, without gangrene: Secondary | ICD-10-CM | POA: Diagnosis not present

## 2015-07-19 DIAGNOSIS — K5660 Unspecified intestinal obstruction: Principal | ICD-10-CM

## 2015-07-19 HISTORY — DX: Localized edema: R60.0

## 2015-07-19 HISTORY — DX: Neuralgia and neuritis, unspecified: M79.2

## 2015-07-19 HISTORY — DX: Neurogenic bowel, not elsewhere classified: K59.2

## 2015-07-19 HISTORY — DX: Neuromuscular dysfunction of bladder, unspecified: N31.9

## 2015-07-19 HISTORY — DX: Multiple sclerosis: G35

## 2015-07-19 HISTORY — DX: Reserved for concepts with insufficient information to code with codable children: IMO0002

## 2015-07-19 HISTORY — DX: Ventral hernia without obstruction or gangrene: K43.9

## 2015-07-19 LAB — CBC WITH DIFFERENTIAL/PLATELET
Basophils Absolute: 0.1 10*3/uL (ref 0–0.1)
Basophils Relative: 1 %
EOS ABS: 0 10*3/uL (ref 0–0.7)
Eosinophils Relative: 0 %
HCT: 47.2 % — ABNORMAL HIGH (ref 35.0–47.0)
HEMOGLOBIN: 15.6 g/dL (ref 12.0–16.0)
LYMPHS ABS: 0.8 10*3/uL — AB (ref 1.0–3.6)
Lymphocytes Relative: 6 %
MCH: 29.8 pg (ref 26.0–34.0)
MCHC: 33 g/dL (ref 32.0–36.0)
MCV: 90.2 fL (ref 80.0–100.0)
Monocytes Absolute: 0.5 10*3/uL (ref 0.2–0.9)
Monocytes Relative: 4 %
NEUTROS PCT: 89 %
Neutro Abs: 12.2 10*3/uL — ABNORMAL HIGH (ref 1.4–6.5)
PLATELETS: 314 10*3/uL (ref 150–440)
RBC: 5.23 MIL/uL — AB (ref 3.80–5.20)
RDW: 14 % (ref 11.5–14.5)
WBC: 13.6 10*3/uL — AB (ref 3.6–11.0)

## 2015-07-19 LAB — LIPASE, BLOOD: LIPASE: 17 U/L (ref 11–51)

## 2015-07-19 LAB — HEPATIC FUNCTION PANEL
ALT: 15 U/L (ref 14–54)
AST: 14 U/L — ABNORMAL LOW (ref 15–41)
Albumin: 4.5 g/dL (ref 3.5–5.0)
Alkaline Phosphatase: 47 U/L (ref 38–126)
TOTAL PROTEIN: 8.8 g/dL — AB (ref 6.5–8.1)
Total Bilirubin: 0.7 mg/dL (ref 0.3–1.2)

## 2015-07-19 LAB — BASIC METABOLIC PANEL
Anion gap: 7 (ref 5–15)
BUN: 11 mg/dL (ref 6–20)
CHLORIDE: 103 mmol/L (ref 101–111)
CO2: 26 mmol/L (ref 22–32)
CREATININE: 0.55 mg/dL (ref 0.44–1.00)
Calcium: 9.4 mg/dL (ref 8.9–10.3)
GFR calc non Af Amer: >60 mL/min (ref >60)
Glucose, Bld: 126 mg/dL — ABNORMAL HIGH (ref 65–99)
Potassium: 4.3 mmol/L (ref 3.5–5.1)
SODIUM: 136 mmol/L (ref 135–145)

## 2015-07-19 MED ORDER — INFLUENZA VAC SPLIT QUAD 0.5 ML IM SUSY
0.5000 mL | PREFILLED_SYRINGE | INTRAMUSCULAR | Status: AC
  Administered 2015-07-21: 0.5 mL via INTRAMUSCULAR
  Filled 2015-07-19: qty 0.5

## 2015-07-19 MED ORDER — ONDANSETRON HCL 4 MG/2ML IJ SOLN
4.0000 mg | Freq: Once | INTRAMUSCULAR | Status: AC
  Administered 2015-07-19: 4 mg via INTRAVENOUS
  Filled 2015-07-19: qty 2

## 2015-07-19 MED ORDER — SODIUM CHLORIDE 0.9 % IV SOLN
INTRAVENOUS | Status: DC
  Administered 2015-07-19 – 2015-07-23 (×11): via INTRAVENOUS

## 2015-07-19 MED ORDER — IOHEXOL 300 MG/ML  SOLN
100.0000 mL | Freq: Once | INTRAMUSCULAR | Status: AC | PRN
  Administered 2015-07-19: 100 mL via INTRAVENOUS

## 2015-07-19 MED ORDER — ONDANSETRON HCL 4 MG/2ML IJ SOLN
4.0000 mg | Freq: Four times a day (QID) | INTRAMUSCULAR | Status: DC | PRN
  Administered 2015-07-20 – 2015-07-21 (×4): 4 mg via INTRAVENOUS
  Filled 2015-07-19 (×4): qty 2

## 2015-07-19 MED ORDER — DIPHENHYDRAMINE HCL 50 MG/ML IJ SOLN: 12.5000 mg | Freq: Four times a day (QID) | INTRAMUSCULAR | Status: DC | PRN

## 2015-07-19 MED ORDER — MORPHINE SULFATE (PF) 2 MG/ML IV SOLN
2.0000 mg | INTRAVENOUS | Status: DC | PRN
  Administered 2015-07-19 – 2015-07-21 (×3): 2 mg via INTRAVENOUS
  Filled 2015-07-19 (×3): qty 1

## 2015-07-19 MED ORDER — KETOROLAC TROMETHAMINE 30 MG/ML IJ SOLN
30.0000 mg | Freq: Four times a day (QID) | INTRAMUSCULAR | Status: DC
  Administered 2015-07-19 – 2015-07-23 (×15): 30 mg via INTRAVENOUS
  Filled 2015-07-19 (×15): qty 1

## 2015-07-19 MED ORDER — MORPHINE SULFATE (PF) 4 MG/ML IV SOLN
4.0000 mg | Freq: Once | INTRAVENOUS | Status: AC
  Administered 2015-07-19: 4 mg via INTRAVENOUS
  Filled 2015-07-19: qty 1

## 2015-07-19 MED ORDER — IOHEXOL 240 MG/ML SOLN
25.0000 mL | Freq: Once | INTRAMUSCULAR | Status: DC | PRN
Start: 2015-07-19 — End: 2015-07-19

## 2015-07-19 MED ORDER — DIAZEPAM 5 MG/ML IJ SOLN: 2.5000 mg | INTRAMUSCULAR | Status: DC | PRN

## 2015-07-19 MED ORDER — BISACODYL 10 MG RE SUPP
10.0000 mg | Freq: Every day | RECTAL | Status: DC
  Administered 2015-07-19 – 2015-07-24 (×5): 10 mg via RECTAL
  Filled 2015-07-19 (×5): qty 1

## 2015-07-19 MED ORDER — SODIUM CHLORIDE 0.9 % IV BOLUS (SEPSIS)
1000.0000 mL | Freq: Once | INTRAVENOUS | Status: AC
  Administered 2015-07-19: 1000 mL via INTRAVENOUS

## 2015-07-19 MED ORDER — PANTOPRAZOLE SODIUM 40 MG IV SOLR
40.0000 mg | Freq: Every day | INTRAVENOUS | Status: DC
  Administered 2015-07-19 – 2015-07-22 (×4): 40 mg via INTRAVENOUS
  Filled 2015-07-19 (×5): qty 40

## 2015-07-19 MED ORDER — ENOXAPARIN SODIUM 40 MG/0.4ML ~~LOC~~ SOLN
40.0000 mg | SUBCUTANEOUS | Status: DC
  Administered 2015-07-19 – 2015-07-24 (×6): 40 mg via SUBCUTANEOUS
  Filled 2015-07-19 (×7): qty 0.4

## 2015-07-19 MED ORDER — DIPHENHYDRAMINE HCL 12.5 MG/5ML PO ELIX
12.5000 mg | ORAL_SOLUTION | Freq: Four times a day (QID) | ORAL | Status: DC | PRN
Start: 2015-07-19 — End: 2015-07-25

## 2015-07-19 MED ORDER — ONDANSETRON 4 MG PO TBDP: 4.0000 mg | ORAL_TABLET | Freq: Four times a day (QID) | ORAL | Status: DC | PRN

## 2015-07-19 NOTE — H&P (Signed)
[name] is an 44 y.o. female.   Chief Complaint: Abdominal pain, nausea and vomiting HPI: 44 yr old female with history of MS progressive type, requiring her to have urosotomy placement in 2013.  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  Patient states pain started last night and continued today.  She states that she has been nauseated and vomited a couple times, but small amounts. Patient states pain has stayed mainly on the right side and has not traveled anywhere.  She did not try any medications to ease the pain, pain is worse with movement.  Patient denies any fever, chills, or diarrhea.  She states that she is chronically constipated and did have  BM yesterday.   Past Medical History  Diagnosis Date  . MS (multiple sclerosis) ([place]) 2005    Past Surgical History  Procedure Laterality Date  . Revision urostomy cutaneous  06/2012  . Back surgery  2004  . Abdominal hysterectomy  2001    Family History  Problem Relation Age of Onset  . Osteoarthritis Mother   . Hypertension Mother   . Hyperlipidemia Mother   . Cancer Father   . Asthma Brother    Social History:  reports that she has never smoked. She does not have any smokeless tobacco history on file. She reports that she does not drink alcohol or use illicit drugs.  Allergies:  Allergies  Allergen Reactions  . Latex Swelling    Facial swelling     (Not in a hospital admission)  Results for orders placed or performed during the hospital encounter of 07/19/15 (from the past 48 hour(s))  Basic metabolic panel     Status: Abnormal   Collection Time: 07/18/15  6:44 AM  Result Value Ref Range   Sodium 136 135 - 145 mmol/L   Potassium 4.3 3.5 - 5.1 mmol/L   Chloride 103 101 - 111 mmol/L   CO2 26 22 - 32 mmol/L   Glucose, Bld 126 (H) 65 - 99 mg/dL   BUN 11 6 - 20 mg/dL   Creatinine, Ser 0.55 0.44 - 1.00 mg/dL   Calcium 9.4 8.9 - 10.3 mg/dL   GFR calc  non Af Amer >60 >60 mL/min   GFR calc Af Amer >60 >60 mL/min    Comment: (NOTE) The eGFR has been calculated using the CKD EPI equation. This calculation has not been validated in all clinical situations. eGFR's persistently <60 mL/min signify possible Chronic Kidney Disease.    Anion gap 7 5 - 15  CBC WITH DIFFERENTIAL     Status: Abnormal   Collection Time: 07/18/15  6:44 AM  Result Value Ref Range   WBC 13.6 (H) 3.6 - 11.0 K/uL   RBC 5.23 (H) 3.80 - 5.20 MIL/uL   Hemoglobin 15.6 12.0 - 16.0 g/dL   HCT 47.2 (H) 35.0 - 47.0 %   MCV 90.2 80.0 - 100.0 fL   MCH 29.8 26.0 - 34.0 pg   MCHC 33.0 32.0 - 36.0 g/dL   RDW 14.0 11.5 - 14.5 %   Platelets 314 150 - 440 K/uL   Neutrophils Relative % 89 %   Neutro Abs 12.2 (H) 1.4 - 6.5 K/uL   Lymphocytes Relative 6 %   Lymphs Abs 0.8 (L) 1.0 - 3.6 K/uL   Monocytes Relative 4 %   Monocytes Absolute 0.5 0.2 - 0.9 K/uL   Eosinophils Relative 0 %  Eosinophils Absolute 0.0 0 - 0.7 K/uL   Basophils Relative 1 %   Basophils Absolute 0.1 0 - 0.1 K/uL  Hepatic function panel     Status: Abnormal   Collection Time: 07/19/15  6:44 AM  Result Value Ref Range   Total Protein 8.8 (H) 6.5 - 8.1 g/dL   Albumin 4.5 3.5 - 5.0 g/dL   AST 14 (L) 15 - 41 U/L   ALT 15 14 - 54 U/L   Alkaline Phosphatase 47 38 - 126 U/L   Total Bilirubin 0.7 0.3 - 1.2 mg/dL   Bilirubin, Direct <0.1 (L) 0.1 - 0.5 mg/dL   Indirect Bilirubin NOT CALCULATED 0.3 - 0.9 mg/dL  Lipase, blood     Status: None   Collection Time: 07/19/15  6:44 AM  Result Value Ref Range   Lipase 17 11 - 51 U/L    Comment: Please note change in reference range.   Ct Abdomen Pelvis W Contrast  07/19/2015  CLINICAL DATA:  Right-sided abdominal/pelvic pain beginning this morning. Nausea and vomiting. Previous bladder resection and urostomy due to complications from [place]. EXAM: CT ABDOMEN AND PELVIS WITH CONTRAST TECHNIQUE: Multidetector CT imaging of the abdomen and pelvis was performed using the  standard protocol following bolus administration of intravenous contrast. CONTRAST:  162m OMNIPAQUE IOHEXOL 300 MG/ML  SOLN COMPARISON:  None. FINDINGS: Lung bases demonstrate minimal bibasilar linear atelectasis. Findings suggesting a small hiatal hernia containing contrast which may be due to dysmotility versus reflux. Abdominal images demonstrate the liver, spleen, pancreas, gallbladder and adrenal glands to be within normal. Appendix is not well visualized. Vascular structures are within normal. Kidneys are normal in size without hydronephrosis or nephrolithiasis. Suggestion of subtle scarring over the upper pole right renal cortex. Visualize ureters are within normal. Evidence of patient's previous cystectomy with ureteral diversion and ileostomy over the right lower quadrant. Surgical suture line is noted over the dilated small bowel loop in the lower midline abdomen. Patient's ileostomy loop of bowel is within normal. There is an additional loop of small bowel extending through the ostomy defect into the abdominal wall with mild adjacent free fluid. There are multiple fluid-filled dilated small bowel loops in the lower abdomen/pelvis likely due to the spot bowel obstruction. This may be due to adhesions over the small bowel loops proximal to the ostomy or secondary to the herniated bowel loop through the ostomy site. Small amount of free fluid in the right lower quadrant. 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 There is a small amount of free pelvic fluid. Rectum is normal. There is mild fecal retention throughout the colon. There are mild degenerate changes of the spine and hips. Posterior fusion hardware is present over the L5-S1 level intact. IMPRESSION: Evidence of previous cystectomy and ureteral diversion with ileostomy over the right lower quadrant. There is evidence of small bowel obstruction which may be due to adhesions of the small bowel loops proximal to the ostomy  site or may be related to an additional short segment of small bowel which is herniated through the ostomy defect. Mild associated free fluid. Electronically Signed   By: DMarin OlpM.D.   On: 07/19/2015 09:41   Dg Chest Portable 1 View  07/19/2015  CLINICAL DATA:  Multiple sclerosis, NG tube placement EXAM: PORTABLE CHEST 1 VIEW COMPARISON:  19 2013 FINDINGS: NG to extends below the hemidiaphragms into the stomach with the tip not visualized related to technique. Low lung volumes evident with increased basilar atelectasis. No  focal pneumonia, collapse or consolidation. Negative for edema, effusion or pneumothorax. Trachea midline. IMPRESSION: NG tube extends into the stomach with the tip not visualized related to portable technique. Low volume exam with bibasilar atelectasis. Electronically Signed   By: Jerilynn Mages.  Shick M.D.   On: 07/19/2015 13:56    Review of Systems  Constitutional: Negative for fever, chills, malaise/fatigue and diaphoresis.  HENT: Negative for congestion and sore throat.   Respiratory: Negative for cough, sputum production, shortness of breath and wheezing.   Cardiovascular: Negative for chest pain, palpitations and leg swelling.  Gastrointestinal: Positive for nausea, vomiting, abdominal pain and constipation. Negative for heartburn, diarrhea, blood in stool and melena.  Genitourinary: Negative for dysuria, hematuria and flank pain.  Musculoskeletal: Negative for myalgias, back pain and joint pain.  Skin: Negative for itching and rash.  Neurological: Negative for tremors, loss of consciousness, weakness and headaches.  Psychiatric/Behavioral: Negative for substance abuse. The patient does not have insomnia.   All other systems reviewed and are negative.   Blood pressure 147/87, pulse 90, temperature 98.1 F (36.7 C), resp. rate 16, height 5' 4"  (1.626 m), weight 185 lb (83.915 kg), SpO2 96 %. Physical Exam  Vitals reviewed. Constitutional: She is oriented to person, place,  and time. She appears well-developed and well-nourished. No distress.  HENT:  Head: Normocephalic and atraumatic.  Right Ear: External ear normal.  Left Ear: External ear normal.  Nose: Nose normal.  Mouth/Throat: Oropharynx is clear and moist. No oropharyngeal exudate.  Eyes: Conjunctivae are normal. Pupils are equal, round, and reactive to light. No scleral icterus.  Neck: Normal range of motion. Neck supple. No tracheal deviation present.  Cardiovascular: Normal rate, regular rhythm and intact distal pulses.  Exam reveals no gallop and no friction rub.   No murmur heard. Respiratory: Effort normal and breath sounds normal. No respiratory distress. She has no wheezes. She has no rales.  GI: Soft. Bowel sounds are normal. She exhibits no distension. There is tenderness.  Tender around urostomy site, able to palpate laterally and felt as if reduced some bowel, otherwise non-tender, urostomy pink with urine in bag  Musculoskeletal: She exhibits no edema or tenderness.  Weakness more in LE than UE  Neurological: She is alert and oriented to person, place, and time. No cranial nerve deficit.  Skin: Skin is warm and dry. No rash noted. No erythema. No pallor.  Psychiatric: She has a normal mood and affect. Her behavior is normal. Judgment and thought content normal.     Assessment/Plan 44 yr old female with progressive MS and history of urolostomy, here with small bowel obstruction.  Feel as if easily reduced small parastomal hernia. Will admit, NPO, place ng tube and will give suppository given the amount of stool throughout her colon.    Lavona Mound  07/19/2015, 2:35 PM

## 2015-07-19 NOTE — ED Notes (Signed)
Pt urostomy bag changed per pt request. Pt assisted. Pt states urostomy is comfortable, no pain, no drainage or redness. Urine production appropriate.

## 2015-07-19 NOTE — ED Notes (Signed)
Pt urostomy bag emptied, 250cc emptied.

## 2015-07-19 NOTE — ED Notes (Signed)
Report from Matt, RN

## 2015-07-19 NOTE — ED Notes (Signed)
Pt unable to have drink, awaiting CT scan results.

## 2015-07-19 NOTE — ED Notes (Signed)
ED Tech at bedside.

## 2015-07-19 NOTE — ED Provider Notes (Signed)
[facility] Emergency Department Provider Note   ____________________________________________  Time seen: 7:15 AM I have reviewed the triage vital signs and the triage nursing note.  HISTORY  Chief Complaint Abdominal Pain   Historian Patient, mother and sister  HPI [name] is a 44 y.o. female is here for evaluation of right lower quadrant pain. She started noticing pain around midnight, and has been been constant and somewhat progressive over the night. Pain currently is reported 10 out of 10. Patient has history of multiple sclerosis and had bladder removed from patient due to compilation of multiple sclerosis. She has a abdominal urine ileostomy at the mid to right abdomen. Patient reports normal bowel movement yesterday. No fevers. She's had pain like this once before, and states she was told that she had a small hernia.    Past Medical History  Diagnosis Date  . MS (multiple sclerosis) ([facility]) 2005    There are no active problems to display for this patient.   Past Surgical History  Procedure Laterality Date  . Revision urostomy cutaneous  06/2012  . Back surgery  2004  . Abdominal hysterectomy  2001    Current Outpatient Rx  Name  Route  Sig  Dispense  Refill  . amantadine (SYMMETREL) 100 MG capsule   Oral   Take 100 mg by mouth 2 (two) times daily.      5   . baclofen (LIORESAL) 20 MG tablet   Oral   Take 20 mg by mouth 3 (three) times daily.         . cholecalciferol (VITAMIN D) 1000 UNITS tablet   Oral   Take 1,000 Units by mouth daily.         . DULoxetine (CYMBALTA) 30 MG capsule   Oral   Take 30 mg by mouth 2 (two) times daily.      3   . Flaxseed, Linseed, (GNP FLAX SEED OIL) 1000 MG CAPS   Oral   Take 1,000 mg by mouth daily.         Marland Kitchen gabapentin (NEURONTIN) 600 MG tablet   Oral   Take 600 mg by mouth 3 (three) times daily.           Allergies Latex  Family History  Problem Relation Age of  Onset  . Osteoarthritis Mother   . Hypertension Mother   . Hyperlipidemia Mother   . Cancer Father   . Asthma Brother     Social History Social History  Substance Use Topics  . Smoking status: Never Smoker   . Smokeless tobacco: None  . Alcohol Use: No    Review of Systems  Constitutional: Negative for fever. Eyes: Negative for visual changes. ENT: Negative for sore throat. Cardiovascular: Negative for chest pain. Respiratory: Negative for shortness of breath. Gastrointestinal: Negative for vomiting and diarrhea. Genitourinary: Negative for change in urine output or urine color. Musculoskeletal: Negative for back pain. Skin: Negative for rash. Neurological: Negative for headache. 10 point Review of Systems otherwise negative ____________________________________________   PHYSICAL EXAM:  VITAL SIGNS: ED Triage Vitals  Enc Vitals Group     BP 07/19/15 0605 112/72 mmHg     Pulse Rate 07/19/15 0605 88     Resp 07/19/15 0605 18     Temp 07/19/15 0605 98.1 F (36.7 C)     Temp src --      SpO2 07/19/15 0605 98 %     Weight 07/19/15 0605 185 lb (83.915 kg)  Height 07/19/15 0605  (1.626 m)     Head Cir --      Peak Flow --      Pain Score 07/19/15 0606 10     Pain Loc --      Pain Edu? --      Excl. in GC? --      Constitutional: Alert and oriented. Well appearing and in no distress. Eyes: Conjunctivae are normal. PERRL. Normal extraocular movements. ENT   Head: Normocephalic and atraumatic.   Nose: No congestion/rhinnorhea.   Mouth/Throat: Mucous membranes are moist.   Neck: No stridor. Cardiovascular/Chest: Normal rate, regular rhythm.  No murmurs, rubs, or gallops. Respiratory: Normal respiratory effort without tachypnea nor retractions. Breath sounds are clear and equal bilaterally. No wheezes/rales/rhonchi. Gastrointestinal: Soft. No distention, no guarding, no rebound. Moderate tenderness to palpation mid abdomen and right side of the  abdomen. Normal-appearing ostomy with clear urine in the bag  Genitourinary/rectal:Deferred Musculoskeletal: Nontender with normal range of motion in all extremities. No joint effusions.  No lower extremity tenderness.  No edema. Neurologic:  Normal speech and language. No gross or focal neurologic deficits are appreciated. Skin:  Skin is warm, dry and intact. No rash noted. Psychiatric: Mood and affect are normal. Speech and behavior are normal. Patient exhibits appropriate insight and judgment.  ____________________________________________   EKG I, Governor Rooks, MD, the attending physician have personally viewed and interpreted all ECGs.  No EKG performed ____________________________________________  LABS (pertinent positives/negatives)  Basic metabolic panel without significant abnormality White blood count 13.6, hemoglobin 15.6 and platelet count 314 Hepatic function panel without significant abnormality Lipase 17  ____________________________________________  RADIOLOGY All Xrays were viewed by me. Imaging interpreted by Radiologist.  CT abdomen and pelvis with contrast:  IMPRESSION: Evidence of previous cystectomy and ureteral diversion with ileostomy over the right lower quadrant. There is evidence of small bowel obstruction which may be due to adhesions of the small bowel loops proximal to the ostomy site or may be related to an additional short segment of small bowel which is herniated through the ostomy defect. Mild associated free fluid.  __________________________________________  PROCEDURES  Procedure(s) performed: None  Critical Care performed: None  ____________________________________________   ED COURSE / ASSESSMENT AND PLAN  CONSULTATIONS: General surgery for admission, Dr. Orvis Brill  Pertinent labs & imaging results that were available during my care of the patient were reviewed by me and considered in my medical decision making (see chart for  details).  Patient's a significant amount of pain, and I discussed with her obtaining a CT scan for further evaluation.  CT scan shows dilated small bowel loops consistent with small bowel obstruction. I consulted general surgery, and spoke with the operating nurse as Dr. Orvis Brill is in surgery.  The patient is stable, and will be evaluated by a surgeon here in the emergency department.  Patient was provided symptomatic pain control and emergency department. Discussed the possibility of NG tube with the patient, we'll defer decision to place an NG to surgeon. Patient is comfortable and stable this point time.  Patient / Family / Caregiver informed of clinical course, medical decision-making process, and agree with plan.    ___________________________________________   FINAL CLINICAL IMPRESSION(S) / ED DIAGNOSES   Final diagnoses:  Small bowel obstruction ([facility])       Governor Rooks, MD 07/19/15 [phone]

## 2015-07-19 NOTE — ED Notes (Signed)
Pt finished with CT contrast, CT informed. Pt family at bedside.

## 2015-07-19 NOTE — ED Notes (Signed)
Pt to Ct

## 2015-07-19 NOTE — ED Notes (Signed)
Pt. Here via EMS from home with lower rt. Abdominal pain.  Pt. States pain started around midnight tonight.  Pt. States dx with "hernia 1 year ago at [facility] and this feels like the same".  Pt. Also had bladder removed in Oct. 2013.  Pain is above urine ostomy.  No redness to area, tender upon palpation.

## 2015-07-19 NOTE — ED Notes (Signed)
MD at bedside. 

## 2015-07-20 LAB — BASIC METABOLIC PANEL
ANION GAP: 9 (ref 5–15)
BUN: 7 mg/dL (ref 6–20)
CHLORIDE: 106 mmol/L (ref 101–111)
CO2: 24 mmol/L (ref 22–32)
Calcium: 8.7 mg/dL — ABNORMAL LOW (ref 8.9–10.3)
Creatinine, Ser: 0.55 mg/dL (ref 0.44–1.00)
GFR calc Af Amer: >60 mL/min (ref >60)
GFR calc non Af Amer: >60 mL/min (ref >60)
GLUCOSE: 98 mg/dL (ref 65–99)
POTASSIUM: 3.8 mmol/L (ref 3.5–5.1)
Sodium: 139 mmol/L (ref 135–145)

## 2015-07-20 LAB — CBC
HEMATOCRIT: 44.5 % (ref 35.0–47.0)
HEMOGLOBIN: 15 g/dL (ref 12.0–16.0)
MCH: 31.2 pg (ref 26.0–34.0)
MCHC: 33.8 g/dL (ref 32.0–36.0)
MCV: 92.3 fL (ref 80.0–100.0)
Platelets: 303 10*3/uL (ref 150–440)
RBC: 4.83 MIL/uL (ref 3.80–5.20)
RDW: 14 % (ref 11.5–14.5)
WBC: 10.9 10*3/uL (ref 3.6–11.0)

## 2015-07-20 NOTE — Progress Notes (Signed)
CC: Abdominal pain Subjective: 44 year old female admitted for bowel obstruction. Patient states that she's continued to have nausea overnight but it appears to be improving. States she has been passing flatus. She is continued to have some abdominal pain but states it appears to improve. She denies any fevers, chills, chest pain, shortness of breath.  Objective: Vital signs in last 24 hours: Temp:  [98 F (36.7 C)-98.6 F (37 C)] 98.4 F (36.9 C) (10/24 0452) Pulse Rate:  [76-95] 83 (10/24 0452) Resp:  [15-16] 16 (10/24 0437) BP: (120-166)/(65-91) 163/91 mmHg (10/24 0452) SpO2:  [96 %-100 %] 99 % (10/24 0452) Last BM Date: 07/18/15  Intake/Output from previous day: 10/23 0701 - 10/24 0700 In: 169 [I.V.:169] Out: 725 [Urine:725] Intake/Output this shift: Total I/O In: 1260.7 [I.V.:1240.7; NG/GT:20] Out: 200 [Emesis/NG output:200]  Physical exam:  Gen.: No acute distress Chest: Clear to auscultation and regular rate and rhythm Abdomen: Soft, tender to palpation in the right lower quadrant, mild distention but no diffuse peritoneal signs  Lab Results: CBC   Recent Labs  07/18/15 0644 07/20/15 0514  WBC 13.6* 10.9  HGB 15.6 15.0  HCT 47.2* 44.5  PLT 314 303   BMET  Recent Labs  07/18/15 0644 07/20/15 0514  NA 136 139  K 4.3 3.8  CL 103 106  CO2 26 24  GLUCOSE 126* 98  BUN 11 7  CREATININE 0.55 0.55  CALCIUM 9.4 8.7*   PT/INR No results for input(s): LABPROT, INR in the last 72 hours. ABG No results for input(s): PHART, HCO3 in the last 72 hours.  Invalid input(s): PCO2, PO2  Studies/Results: Ct Abdomen Pelvis W Contrast  07/19/2015  CLINICAL DATA:  Right-sided abdominal/pelvic pain beginning this morning. Nausea and vomiting. Previous bladder resection and urostomy due to complications from [place]. EXAM: CT ABDOMEN AND PELVIS WITH CONTRAST TECHNIQUE: Multidetector CT imaging of the abdomen and pelvis was performed using the standard protocol following  bolus administration of intravenous contrast. CONTRAST:  OMNIPAQUE IOHEXOL 300 MG/ML  SOLN COMPARISON:  None. FINDINGS: Lung bases demonstrate minimal bibasilar linear atelectasis. Findings suggesting a small hiatal hernia containing contrast which may be due to dysmotility versus reflux. Abdominal images demonstrate the liver, spleen, pancreas, gallbladder and adrenal glands to be within normal. Appendix is not well visualized. Vascular structures are within normal. Kidneys are normal in size without hydronephrosis or nephrolithiasis. Suggestion of subtle scarring over the upper pole right renal cortex. Visualize ureters are within normal. Evidence of patient's previous cystectomy with ureteral diversion and ileostomy over the right lower quadrant. Surgical suture line is noted over the dilated small bowel loop in the lower midline abdomen. Patient's ileostomy loop of bowel is within normal. There is an additional loop of small bowel extending through the ostomy defect into the abdominal wall with mild adjacent free fluid. There are multiple fluid-filled dilated small bowel loops in the lower abdomen/pelvis likely due to the spot bowel obstruction. This may be due to adhesions over the small bowel loops proximal to the ostomy or secondary to the herniated bowel loop through the ostomy site. Small amount of free fluid in the right lower quadrant. 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 There is a small amount of free pelvic fluid. Rectum is normal. There is mild fecal retention throughout the colon. There are mild degenerate changes of the spine and hips. Posterior fusion hardware is present over the L5-S1 level intact. IMPRESSION: Evidence of previous cystectomy and ureteral diversion with ileostomy over the  right lower quadrant. There is evidence of small bowel obstruction which may be due to adhesions of the small bowel loops proximal to the ostomy site or may be related to an  additional short segment of small bowel which is herniated through the ostomy defect. Mild associated free fluid. Electronically Signed   By: Elberta Fortis M.D.   On: 07/19/2015 09:41   Dg Chest Portable 1 View  07/19/2015  CLINICAL DATA:  Multiple sclerosis, NG tube placement EXAM: PORTABLE CHEST 1 VIEW COMPARISON:  19 2013 FINDINGS: NG to extends below the hemidiaphragms into the stomach with the tip not visualized related to technique. Low lung volumes evident with increased basilar atelectasis. No focal pneumonia, collapse or consolidation. Negative for edema, effusion or pneumothorax. Trachea midline. IMPRESSION: NG tube extends into the stomach with the tip not visualized related to portable technique. Low volume exam with bibasilar atelectasis. Electronically Signed   By: Judie Petit.  Shick M.D.   On: 07/19/2015 13:56    Anti-infectives: Anti-infectives    None      Assessment/Plan:  44 year old female with multiple medical problems and small bowel obstruction. NG output has been minimal the patient continues to have nausea. Labs are pending due to computer problems with the lab. We'll continue current treatment plan. Awaiting return of labs. If she does not improve will possibly need operative intervention for bowel obstruction.   T. Tonita Cong, MD, FACS  07/20/2015

## 2015-07-21 ENCOUNTER — Inpatient Hospital Stay: Payer: Medicare Other

## 2015-07-21 DIAGNOSIS — K5660 Unspecified intestinal obstruction: Secondary | ICD-10-CM | POA: Diagnosis not present

## 2015-07-21 LAB — BASIC METABOLIC PANEL
Anion gap: 11 (ref 5–15)
BUN: 13 mg/dL (ref 6–20)
CALCIUM: 8.5 mg/dL — AB (ref 8.9–10.3)
CO2: 21 mmol/L — AB (ref 22–32)
CREATININE: 0.54 mg/dL (ref 0.44–1.00)
Chloride: 109 mmol/L (ref 101–111)
GFR calc non Af Amer: >60 mL/min (ref >60)
Glucose, Bld: 107 mg/dL — ABNORMAL HIGH (ref 65–99)
Potassium: 3.6 mmol/L (ref 3.5–5.1)
SODIUM: 141 mmol/L (ref 135–145)

## 2015-07-21 LAB — CBC
HCT: 42.5 % (ref 35.0–47.0)
Hemoglobin: 14 g/dL (ref 12.0–16.0)
MCH: 30.3 pg (ref 26.0–34.0)
MCHC: 32.9 g/dL (ref 32.0–36.0)
MCV: 92.1 fL (ref 80.0–100.0)
Platelets: 334 10*3/uL (ref 150–440)
RBC: 4.61 MIL/uL (ref 3.80–5.20)
RDW: 14 % (ref 11.5–14.5)
WBC: 7.4 10*3/uL (ref 3.6–11.0)

## 2015-07-21 LAB — PHOSPHORUS: PHOSPHORUS: 3.2 mg/dL (ref 2.5–4.6)

## 2015-07-21 LAB — MAGNESIUM: MAGNESIUM: 2 mg/dL (ref 1.7–2.4)

## 2015-07-21 MED ORDER — ACETAMINOPHEN 10 MG/ML IV SOLN
1000.0000 mg | Freq: Four times a day (QID) | INTRAVENOUS | Status: AC
  Administered 2015-07-21 – 2015-07-22 (×4): 1000 mg via INTRAVENOUS
  Filled 2015-07-21 (×4): qty 100

## 2015-07-21 NOTE — Care Management Important Message (Signed)
Important Message  Patient Details  Name: [name] MRN: [id number] Date of Birth: Jan 28, 1971   Medicare Important Message Given:  Yes-second notification given    Olegario Messier A Allmond 07/21/2015, 9:31 AM

## 2015-07-21 NOTE — Progress Notes (Signed)
CC: Small bowel obstruction Subjective: Patient reports multiple episodes of flatus since my last visit. She states that her discomfort seems to have improved overnight. Only mild pain which is currently tolerable.  Objective: Vital signs in last 24 hours: Temp:  [98.3 F (36.8 C)-98.4 F (36.9 C)] 98.4 F (36.9 C) (10/25 1222) Pulse Rate:  [98-102] 98 (10/25 1222) Resp:  [20] 20 (10/25 1222) BP: (144-148)/(82-90) 144/90 mmHg (10/25 1222) SpO2:  [97 %-98 %] 98 % (10/25 1222) Last BM Date: 07/20/15 (small amount)  Intake/Output from previous day: 10/24 0701 - 10/25 0700 In: 3903.3 [I.V.:3783.3; NG/GT:120] Out: 1900 [Urine:1200; Emesis/NG output:700] Intake/Output this shift: Total I/O In: 957.1 [I.V.:927.1; NG/GT:30] Out: 351 [Urine:250; Emesis/NG output:100; Stool:1]  Physical exam:  Gen.: No acute distress Chest: Clear to auscultation CV: Regular rate and rhythm Abdomen: Soft, mildly distended, tender to palpation to deep palpation around her urostomy. No evidence of incarcerated hernia  Lab Results: CBC   Recent Labs  07/20/15 0514 07/21/15 0617  WBC 10.9 7.4  HGB 15.0 14.0  HCT 44.5 42.5  PLT 303 334   BMET  Recent Labs  07/20/15 0514 07/21/15 0617  NA 139 141  K 3.8 3.6  CL 106 109  CO2 24 21*  GLUCOSE 98 107*  BUN 7 13  CREATININE 0.55 0.54  CALCIUM 8.7* 8.5*   PT/INR No results for input(s): LABPROT, INR in the last 72 hours. ABG No results for input(s): PHART, HCO3 in the last 72 hours.  Invalid input(s): PCO2, PO2  Studies/Results: Dg Abd 1 View  07/21/2015  CLINICAL DATA:  Small-bowel obstruction with persistent abdominal pain EXAM: ABDOMEN - 1 VIEW COMPARISON:  07/19/2015 FINDINGS: Persistent small bowel dilatation is noted in the central abdomen. Air is noted within the colon indicating a partial small bowel obstruction. Considerable fecal material is noted within the right colon. Postsurgical changes in the lumbar spine are noted. No  other focal abnormality is seen. IMPRESSION: Persistent small bowel obstruction Electronically Signed   By: Alcide Clever M.D.   On: 07/21/2015 10:18    Anti-infectives: Anti-infectives    None      Assessment/Plan:  44 year old female with persistent small bowel obstruction. Patient reports passing flatus and feeling somewhat better today than yesterday. X-ray however shows continued dilation of small bowel. Discussed continued NG decompression for an additional 24 hours and then making decision as to whether to continue versus operative intervention. Patient voiced understanding. We'll continue with nothing by mouth and NG tube decompression.   T. Tonita Cong, MD, FACS  07/21/2015

## 2015-07-21 NOTE — Progress Notes (Signed)
Flu vaccine was administered per order.  Patient requests to have a dose of Tylenol.  Telephone order was given for 1 gram Iv once.

## 2015-07-21 NOTE — Plan of Care (Signed)
Problem: Phase II Progression Outcomes Goal: Progress activity as tolerated unless otherwise ordered Outcome: Not Progressing Pt uses wheelchair at home due to multiple sclerosis.

## 2015-07-22 NOTE — Progress Notes (Signed)
CC: Small bowel obstruction  Subjective: Patient reports having a bowel movement yesterday. She also reports decreased abdominal distention and pain. No nausea over the last 24 hours. Feeling better.  Objective: Vital signs in last 24 hours: Temp:  [97.9 F (36.6 C)-98.4 F (36.9 C)] 97.9 F (36.6 C) (10/26 0423) Pulse Rate:  [87-98] 87 (10/26 0423) Resp:  [18-20] 20 (10/26 0423) BP: (124-144)/(58-90) 124/58 mmHg (10/26 0423) SpO2:  [98 %-100 %] 99 % (10/26 0423) Last BM Date: 07/21/15  Intake/Output from previous day: 10/25 0701 - 10/26 0700 In: 3390 [I.V.:3170; NG/GT:120; IV Piggyback:100] Out: 1851 [Urine:1050; Emesis/NG output:800; Stool:1] Intake/Output this shift:    Physical exam:  Gen.: No acute distress Chest: Clear to auscultation Heart: Regular rate and rhythm Abdomen: Soft, non-distended, minimally tender around ostomy site.  Lab Results: CBC   Recent Labs  07/20/15 0514 07/21/15 0617  WBC 10.9 7.4  HGB 15.0 14.0  HCT 44.5 42.5  PLT 303 334   BMET  Recent Labs  07/20/15 0514 07/21/15 0617  NA 139 141  K 3.8 3.6  CL 106 109  CO2 24 21*  GLUCOSE 98 107*  BUN 7 13  CREATININE 0.55 0.54  CALCIUM 8.7* 8.5*   PT/INR No results for input(s): LABPROT, INR in the last 72 hours. ABG No results for input(s): PHART, HCO3 in the last 72 hours.  Invalid input(s): PCO2, PO2  Studies/Results: Dg Abd 1 View  07/21/2015  CLINICAL DATA:  Small-bowel obstruction with persistent abdominal pain EXAM: ABDOMEN - 1 VIEW COMPARISON:  07/19/2015 FINDINGS: Persistent small bowel dilatation is noted in the central abdomen. Air is noted within the colon indicating a partial small bowel obstruction. Considerable fecal material is noted within the right colon. Postsurgical changes in the lumbar spine are noted. No other focal abnormality is seen. IMPRESSION: Persistent small bowel obstruction Electronically Signed   By: Alcide Clever M.D.   On: 07/21/2015 10:18     Anti-infectives: Anti-infectives    None      Assessment/Plan:  44 year old female admitted for small bowel traction. Having flatus and a bowel movement yesterday. We will trial clamping NG tube this morning. If passes trial remove NG tube later today.   T. Tonita Cong, MD, FACS  07/22/2015

## 2015-07-22 NOTE — Progress Notes (Signed)
Patient tolerated the clamping of the NG tube without any increase in pain or nausea. Will remove NG tube today however keep restricted eyes chips overnight  Hope to restart diet in AM  Samantha Frame, MD [facility] General Surgeon [facility]

## 2015-07-22 NOTE — Progress Notes (Signed)
Gastric residual from NG tube was miniscule. NG tube is still clamped. Awaiting new orders.

## 2015-07-22 NOTE — Progress Notes (Signed)
NG tube is clamped per Dr.'s order. Will leave clamped for 6 hours and re-assess at 1440.

## 2015-07-23 MED ORDER — HYDROCODONE-ACETAMINOPHEN 5-325 MG PO TABS
1.0000 | ORAL_TABLET | ORAL | Status: DC | PRN
  Administered 2015-07-23: 2 via ORAL
  Administered 2015-07-23: 1 via ORAL
  Administered 2015-07-24: 2 via ORAL
  Filled 2015-07-23: qty 2
  Filled 2015-07-23: qty 1
  Filled 2015-07-23: qty 2

## 2015-07-23 NOTE — Care Management Important Message (Signed)
Important Message  Patient Details  Name: MISTEY FIMBRES MRN: [id number] Date of Birth: [birth year]   Medicare Important Message Given:  Yes-third notification given    Olegario Messier A Allmond 07/23/2015, 9:15 AM

## 2015-07-23 NOTE — Care Management Note (Signed)
Case Management Note  Patient Details  Name: [name] MRN: [id number] Date of Birth: October 12, 1970  Subjective/Objective: Met with patient and her mother to discuss discharge planning. Patient lives at home with her mother. She uses a WC. She has all needed equipment. Would like PCS. Encouraged her to apply for medicaid. "It is hard on just my mom". Explained she could private pay for a sitter and would be best if she could find her own as they are not able to private pay through an agency at this time. Denies issues obtaining medications, medical care or  Transporation. No needs identified.                  Action/Plan:   Expected Discharge Date:                  Expected Discharge Plan:  Home/Self Care  In-House Referral:     Discharge planning Services     Post Acute Care Choice:    Choice offered to:     DME Arranged:    DME Agency:     HH Arranged:    [employer] Agency:     Status of Service:  Completed, signed off  Medicare Important Message Given:  Yes-third notification given Date Medicare IM Given:    Medicare IM give by:    Date Additional Medicare IM Given:    Additional Medicare Important Message give by:     If discussed at [place] of Stay Meetings, dates discussed:    Additional Comments:  Jolly Mango, RN 07/23/2015, 10:21 AM

## 2015-07-23 NOTE — Progress Notes (Signed)
CC: SBO Subjective: Patient states that she is continuing to feel better. Pain improved. No nausea with NGT out for 12+ hours now. Having flatus. Hungry  Objective: Vital signs in last 24 hours: Temp:  [98.1 F (36.7 C)-99 F (37.2 C)] 99 F (37.2 C) (10/27 0556) Pulse Rate:  [79-95] 91 (10/27 0556) Resp:  [16-19] 16 (10/27 0556) BP: (137-146)/(69-79) 146/69 mmHg (10/27 0556) SpO2:  [99 %-100 %] 100 % (10/27 0556) Last BM Date: 07/21/15  Intake/Output from previous day: 10/26 0701 - 10/27 0700 In: 1575.3 [I.V.:1575.3] Out: 3275 [Urine:3225; Emesis/NG output:50] Intake/Output this shift: Total I/O In: -  Out: 1500 [Urine:1500]  Physical exam:  Gen: NAD Resp: CTA CV: RRR ABD: Soft, Minimally TTP around ostomy site, ND, +BS  Lab Results: CBC   Recent Labs  07/21/15 0617  WBC 7.4  HGB 14.0  HCT 42.5  PLT 334   BMET  Recent Labs  07/21/15 0617  NA 141  K 3.6  CL 109  CO2 21*  GLUCOSE 107*  BUN 13  CREATININE 0.54  CALCIUM 8.5*   PT/INR No results for input(s): LABPROT, INR in the last 72 hours. ABG No results for input(s): PHART, HCO3 in the last 72 hours.  Invalid input(s): PCO2, PO2  Studies/Results: No results found.  Anti-infectives: Anti-infectives    None      Assessment/Plan:  44 year old female with resolving/resolved SBO. Still unsure if secondary to adhesions vs parastomal hernia Restart diet today. If able to tolerate diet will then be ready for discharge later today.   T. Tonita Cong, MD, FACS  07/23/2015

## 2015-07-24 LAB — URINALYSIS COMPLETE WITH MICROSCOPIC (ARMC ONLY)
Bilirubin Urine: NEGATIVE
Glucose, UA: NEGATIVE mg/dL
Nitrite: POSITIVE — AB
PROTEIN: 30 mg/dL — AB
Specific Gravity, Urine: 1.012 (ref 1.005–1.030)
pH: 7 (ref 5.0–8.0)

## 2015-07-24 MED ORDER — AMANTADINE HCL 100 MG PO CAPS
100.0000 mg | ORAL_CAPSULE | Freq: Two times a day (BID) | ORAL | Status: DC
  Administered 2015-07-24 (×2): 100 mg via ORAL
  Filled 2015-07-24 (×2): qty 1

## 2015-07-24 MED ORDER — GABAPENTIN 600 MG PO TABS
600.0000 mg | ORAL_TABLET | Freq: Three times a day (TID) | ORAL | Status: DC
  Administered 2015-07-24 (×3): 600 mg via ORAL
  Filled 2015-07-24 (×3): qty 1

## 2015-07-24 MED ORDER — GNP FLAX SEED OIL 1000 MG PO CAPS: 1000.0000 mg | ORAL_CAPSULE | Freq: Every day | ORAL | Status: DC

## 2015-07-24 MED ORDER — BACLOFEN 10 MG PO TABS
20.0000 mg | ORAL_TABLET | Freq: Three times a day (TID) | ORAL | Status: DC
Start: 2015-07-24 — End: 2015-07-25
  Administered 2015-07-24 (×3): 20 mg via ORAL
  Filled 2015-07-24 (×3): qty 2

## 2015-07-24 MED ORDER — DULOXETINE HCL 30 MG PO CPEP
30.0000 mg | ORAL_CAPSULE | Freq: Two times a day (BID) | ORAL | Status: DC
  Administered 2015-07-24 (×2): 30 mg via ORAL
  Filled 2015-07-24 (×2): qty 1

## 2015-07-24 MED ORDER — VITAMIN D3 25 MCG (1000 UNIT) PO TABS
1000.0000 [IU] | ORAL_TABLET | Freq: Every day | ORAL | Status: DC
  Administered 2015-07-24: 1000 [IU] via ORAL
  Filled 2015-07-24 (×2): qty 1

## 2015-07-24 MED ORDER — SULFAMETHOXAZOLE-TRIMETHOPRIM 800-160 MG PO TABS
1.0000 | ORAL_TABLET | Freq: Two times a day (BID) | ORAL | Status: DC
  Administered 2015-07-24 (×2): 1 via ORAL
  Filled 2015-07-24 (×2): qty 1

## 2015-07-24 NOTE — Progress Notes (Signed)
Initial Nutrition Assessment     INTERVENTION:  Meals and snacks: Cater to pt preferences and monitor intake   NUTRITION DIAGNOSIS:   Inadequate oral intake related to altered GI function as evidenced by per patient/family report.    GOAL:   Patient will meet greater than or equal to 90% of their needs    MONITOR:    (Energy intake, Digestive system)  REASON FOR ASSESSMENT:   LOS    ASSESSMENT:      Pt admitted with SBO Past Medical History  Diagnosis Date  . MS (multiple sclerosis) (HCC) 2005  . Abdominal wall hernia 09/05/2014  . 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 Edema leg 09/05/2014  . 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 Neurogenic bowel 08/12/2012  . Neuropathic pain 05/21/2015     Current Nutrition: tolerating solid foods per pt  Food/Nutrition-Related History:  Normal appetite prior to admission   Scheduled Medications:  . amantadine  100 mg Oral BID  . baclofen  20 mg Oral TID  . bisacodyl  10 mg Rectal Daily  . cholecalciferol  1,000 Units Oral Daily  . DULoxetine  30 mg Oral BID  . enoxaparin (LOVENOX) injection  40 mg Subcutaneous Q24H  . gabapentin  600 mg Oral TID  . pantoprazole (PROTONIX) IV  40 mg Intravenous QHS  . sulfamethoxazole-trimethoprim  1 tablet Oral Q12H      Electrolyte/Renal Profile and Glucose Profile:   Recent Labs Lab 07/18/15 0644 07/20/15 0514 07/21/15 0617  NA 136 139 141  K 4.3 3.8 3.6  CL 103 106 109  CO2 26 24 21*  BUN 11 7 13   CREATININE 0.55 0.55 0.54  CALCIUM 9.4 8.7* 8.5*  MG  --   --  2.0  PHOS  --   --  3.2  GLUCOSE 126* 98 107*   Protein Profile:  Recent Labs Lab 07/19/15 0644  ALBUMIN 4.5    Gastrointestinal Profile: Last BM:10/28   Weight Change: stable wt   Diet Order:  DIET SOFT Room service appropriate?: Yes; Fluid consistency:: Thin  Skin:   reveiwed Height:   Ht Readings  from Last 1 Encounters:  07/19/15 5\' 4"  (1.626 m)    Weight:   Wt Readings from Last 1 Encounters:  07/19/15 185 lb (83.915 kg)       BMI:  Body mass index is 31.74 kg/(m^2).    EDUCATION NEEDS:   No education needs identified at this time  LOW Care Level   B. Freida Busman, RD, LDN [phone] (pager)

## 2015-07-24 NOTE — Progress Notes (Signed)
CC: Small bowel obstruction Subjective: 44 year old female admitted to surgery service with small bowel traction. She has been tolerating a diet and having bowel function. She did have some nausea yesterday which made her hesitate to advance her diet. Also states she feels like she might be getting urinary tract infection from her ileal conduit.  Objective: Vital signs in last 24 hours: Temp:  [98.1 F (36.7 C)] 98.1 F (36.7 C) (10/28 0450) Pulse Rate:  [86-97] 86 (10/28 0450) Resp:  [16-20] 16 (10/28 0450) BP: (132-143)/(59-95) 132/59 mmHg (10/28 0450) SpO2:  [98 %-100 %] 99 % (10/28 0450) Last BM Date: 07/23/15  Intake/Output from previous day: 10/27 0701 - 10/28 0700 In: 220 [P.O.:220] Out: 4550 [Urine:4550] Intake/Output this shift:    Physical exam:  Gen.: No acute distress Chest: Clear to auscultation Heart: Regular rate and rhythm Abdomen: Soft, minimally tender to palpation around his ileostomy site, nondistended.  Lab Results: CBC  No results for input(s): WBC, HGB, HCT, PLT in the last 72 hours. BMET No results for input(s): NA, K, CL, CO2, GLUCOSE, BUN, CREATININE, CALCIUM in the last 72 hours. PT/INR No results for input(s): LABPROT, INR in the last 72 hours. ABG No results for input(s): PHART, HCO3 in the last 72 hours.  Invalid input(s): PCO2, PO2  Studies/Results: No results found.  Anti-infectives: Anti-infectives    None      Assessment/Plan:  44 year old female admitted for small bowel traction. Appears to be resolved. We'll advance her diet today. Will check urine for urinary tract infection. Opal for discharge later today.   T. Tonita Cong, MD, FACS  07/24/2015

## 2015-07-25 MED ORDER — HYDROCODONE-ACETAMINOPHEN 5-325 MG PO TABS: 1.0000 | ORAL_TABLET | ORAL | Status: DC | PRN

## 2015-07-25 MED ORDER — PANTOPRAZOLE SODIUM 40 MG PO TBEC: 40.0000 mg | DELAYED_RELEASE_TABLET | Freq: Every day | ORAL | Status: DC

## 2015-07-25 MED ORDER — ONDANSETRON 4 MG PO TBDP: 4.0000 mg | ORAL_TABLET | Freq: Four times a day (QID) | ORAL | Status: DC | PRN

## 2015-07-25 MED ORDER — SULFAMETHOXAZOLE-TRIMETHOPRIM 800-160 MG PO TABS: 1.0000 | ORAL_TABLET | Freq: Two times a day (BID) | ORAL | Status: DC

## 2015-07-25 NOTE — Final Progress Note (Signed)
Patient did well overnight. Tolerated PO without any return of nausea or pain. States she is ready to go home.  VSS and Abdomen completely benign on exam today  A/P: 44 y/o female with SBO that has resolved during hospital stay Also with UTI that was diagnosed during hospitalization. Will see back in clinic in 1-2 weeks She is also to follow up with her primary care as she can.  Ricarda Frame, MD [facility] General Surgeon [facility] 07/25/2015

## 2015-07-25 NOTE — Discharge Instructions (Signed)
Small Bowel Obstruction °A small bowel obstruction is a blockage in the small bowel. The small bowel, which is also called the small intestine, is a long, slender tube that connects the stomach to the colon. When a person eats and drinks, food and fluids go from the stomach to the small bowel. This is where most of the nutrients in the food and fluids are absorbed. °A small bowel obstruction will prevent food and fluids from passing through the small bowel as they normally do during digestion. The small bowel can become partially or completely blocked. This can cause symptoms such as abdominal pain, vomiting, and bloating. If this condition is not treated, it can be dangerous because the small bowel could rupture. °CAUSES °Common causes of this condition include: °· Scar tissue from previous surgery or radiation treatment. °· Recent surgery. This may cause the movements of the bowel to slow down and cause food to block the intestine. °· Hernias. °· Inflammatory bowel disease (colitis). °· Twisting of the bowel (volvulus). °· Tumors. °· A foreign body. °· Slipping of a part of the bowel into another part (intussusception). °SYMPTOMS °Symptoms of this condition include: °· Abdominal pain. This may be dull cramps or sharp pain. It may occur in one area, or it may be present in the entire abdomen. Pain can range from mild to severe, depending on the degree of obstruction. °· Nausea and vomiting. Vomit may be greenish or a yellow bile color. °· Abdominal bloating. °· Constipation. °· Lack of passing gas. °· Frequent belching. °· Diarrhea. This may occur if the obstruction is partial and runny stool is able to leak around the obstruction. °DIAGNOSIS °This condition may be diagnosed based on a physical exam, medical history, and X-rays of the abdomen. You may also have other tests, such as a CT scan of the abdomen and pelvis. °TREATMENT °Treatment for this condition depends on the cause and severity of the problem.  Treatment options may include: °· Bed rest along with fluids and pain medicines that are given through an IV tube inserted into one of your veins. Sometimes, this is all that is needed for the obstruction to improve. °· Following a simple diet. In some cases, a clear liquid diet may be required for several days. This allows the bowel to rest. °· Placement of a small tube (nasogastric tube) into the stomach. When the bowel is blocked, it usually swells up like a balloon that is filled with air and fluids. The air and fluids may be removed by suction through the nasogastric tube. This can help with pain, discomfort, and nausea. It can also help the obstruction to clear up faster. °· Surgery. This may be required if other treatments do not work. Bowel obstruction from a hernia may require early surgery and can be an emergency procedure. Surgery may also be required for scar tissue that causes frequent or severe obstructions. °HOME CARE INSTRUCTIONS °· Get plenty of rest. °· Follow instructions from your health care provider about eating restrictions. You may need to avoid solid foods and consume only clear liquids until your condition improves. °· Take over-the-counter and prescription medicines only as told by your health care provider. °· Keep all follow-up visits as told by your health care provider. This is important. °SEEK MEDICAL CARE IF: °· You have a fever. °· You have chills. °SEEK IMMEDIATE MEDICAL CARE IF: °· You have increased pain or cramping. °· You vomit blood. °· You have uncontrolled vomiting or nausea. °· You cannot drink   fluids because of vomiting or pain. °· You develop confusion. °· You begin feeling very dry or thirsty (dehydrated). °· You have severe bloating. °· You feel extremely weak or you faint. °  °This information is not intended to replace advice given to you by your health care provider. Make sure you discuss any questions you have with your health care provider. °  °Document Released:  11/29/2005 Document Revised: 06/03/2015 Document Reviewed: 11/06/2014 °Elsevier Interactive Patient Education ©2016 [employer] ° ° °

## 2015-07-25 NOTE — Discharge Summary (Signed)
Patient ID: [name] MRN: [id number] DOB/AGE: June 17, 1971 44 y.o.  Admit date: 07/19/2015 Discharge date: 07/25/2015  Discharge Diagnoses:  Small bowel obstruction  Procedures Performed: none  Discharged Condition: good  Hospital Course: Patient admitted from ER with small bowel obstruction. Had parastomal hernia reduced on admission, but also with likely adhesions internally. Had NGT for 3 days with gradually resolution of output and return of appetite and bowel function. Found to have UTI during admission as well.  Discharge Orders: Discharge home; gradually increase oral intake.  Disposition: Home, close follow up in clinic and with Primary Care.  Discharge Medications:  Current facility-administered medications:  .  amantadine (SYMMETREL) capsule 100 mg, 100 mg, Oral, BID, Ricarda Frame, MD, 100 mg at 07/24/15 2056 .  baclofen (LIORESAL) tablet 20 mg, 20 mg, Oral, TID, Ricarda Frame, MD, 20 mg at 07/24/15 2057 .  bisacodyl (DULCOLAX) suppository 10 mg, 10 mg, Rectal, Daily, Gladis Riffle, MD, 10 mg at 07/24/15 1021 .  cholecalciferol (VITAMIN D) tablet 1,000 Units, 1,000 Units, Oral, Daily, Ricarda Frame, MD, 1,000 Units at 07/24/15 1020 .  diazepam (VALIUM) injection 2.5 mg, 2.5 mg, Intravenous, Q4H PRN, Gladis Riffle, MD .  diphenhydrAMINE (BENADRYL) 12.5 MG/5ML elixir 12.5 mg, 12.5 mg, Oral, Q6H PRN **OR** diphenhydrAMINE (BENADRYL) injection 12.5 mg, 12.5 mg, Intravenous, Q6H PRN, Gladis Riffle, MD .  DULoxetine (CYMBALTA) DR capsule 30 mg, 30 mg, Oral, BID, Ricarda Frame, MD, 30 mg at 07/24/15 2058 .  enoxaparin (LOVENOX) injection 40 mg, 40 mg, Subcutaneous, Q24H, Gladis Riffle, MD, 40 mg at 07/24/15 1557 .  gabapentin (NEURONTIN) tablet 600 mg, 600 mg, Oral, TID, Ricarda Frame, MD, 600 mg at 07/24/15 2057 .  HYDROcodone-acetaminophen (NORCO/VICODIN) 5-325 MG per tablet 1-2 tablet, 1-2 tablet, Oral, Q4H PRN, Ricarda Frame, MD, 2 tablet  at 07/24/15 0800 .  ondansetron (ZOFRAN-ODT) disintegrating tablet 4 mg, 4 mg, Oral, Q6H PRN **OR** ondansetron (ZOFRAN) injection 4 mg, 4 mg, Intravenous, Q6H PRN, Gladis Riffle, MD, 4 mg at 07/21/15 0840 .  pantoprazole (PROTONIX) EC tablet 40 mg, 40 mg, Oral, QHS, Gladis Riffle, MD .  sulfamethoxazole-trimethoprim (BACTRIM DS,SEPTRA DS) 800-160 MG per tablet 1 tablet, 1 tablet, Oral, Q12H, Ricarda Frame, MD, 1 tablet at 07/24/15 2057  Follwup: Follow-up Information    Follow up with [facility]. Schedule an appointment as soon as possible for a visit in 1 week.   Specialty:  General Surgery   Why:  hospital follow up   Contact information:   [address] [place] [address] [phone]      Signed: Ricarda Frame 07/25/2015, 8:28 AM

## 2015-07-25 NOTE — Progress Notes (Signed)
Pt stable. D/c instructions given and education provided. Signed prescriptions given. No IV in place. Urostomy bag changed. Pt waiting on ride and will be escorted out by staff.

## 2015-07-31 ENCOUNTER — Ambulatory Visit (INDEPENDENT_AMBULATORY_CARE_PROVIDER_SITE_OTHER): Payer: Medicare Other | Admitting: General Surgery

## 2015-07-31 ENCOUNTER — Other Ambulatory Visit
Admission: RE | Admit: 2015-07-31 | Discharge: 2015-07-31 | Disposition: A | Discharge status: Home / Self Care | Payer: 59 | Source: Ambulatory Visit | Attending: General Surgery | Admitting: General Surgery

## 2015-07-31 ENCOUNTER — Encounter: Payer: Self-pay | Admitting: General Surgery

## 2015-07-31 ENCOUNTER — Other Ambulatory Visit: Payer: Self-pay | Admitting: *Deleted

## 2015-07-31 VITALS — BP 110/78 | HR 92 | Temp 97.7°F | Ht 64.0 in | Wt 187.0 lb

## 2015-07-31 DIAGNOSIS — K433 Parastomal hernia with obstruction, without gangrene: Secondary | ICD-10-CM | POA: Insufficient documentation

## 2015-07-31 DIAGNOSIS — G8929 Other chronic pain: Secondary | ICD-10-CM | POA: Insufficient documentation

## 2015-07-31 DIAGNOSIS — N390 Urinary tract infection, site not specified: Secondary | ICD-10-CM | POA: Diagnosis present

## 2015-07-31 DIAGNOSIS — R1031 Right lower quadrant pain: Secondary | ICD-10-CM | POA: Diagnosis not present

## 2015-07-31 LAB — URINALYSIS COMPLETE WITH MICROSCOPIC (ARMC ONLY)
Bilirubin Urine: NEGATIVE
GLUCOSE, UA: NEGATIVE mg/dL
KETONES UR: NEGATIVE mg/dL
Nitrite: POSITIVE — AB
Protein, ur: 100 mg/dL — AB
SPECIFIC GRAVITY, URINE: 1.015 (ref 1.005–1.030)
pH: 6 (ref 5.0–8.0)

## 2015-07-31 NOTE — Patient Instructions (Signed)
We will check your urine and have it cultured. If it grows something and your antibiotic does not cover, we will call you and let you know of a new antibiotic. We will contact Dr. Katrinka Blazing to let her know about your colostomy.

## 2015-07-31 NOTE — Progress Notes (Signed)
Outpatient Surgical Follow Up  07/31/2015  [name] is an 44 y.o. female.   Chief Complaint  Patient presents with  . Follow-up    ED-Small Bowel Obstruction    HPI: 44 year old female returns to clinic for follow-up 1 week after discharge for small bowel obstruction secondary to parastomal hernia. Patient reports she's been tolerating a diet at home although had to take Zofran at one point for nausea but did not have any vomiting. She's been having bowel function however also has then developed constipation after using Zofran which required use of an enema. She states that she's had use enemas in the past due to her multiple sclerosis. She continues to have some abdominal pain around her urostomy site and she is also noticed a return of the bulging just above her urostomy site. She denies any fevers or chills but she has had some sweating. Denies any chest pain or shortness of breath. She is in her usual state of health with her multiple sclerosis.  Past Medical History  Diagnosis Date  . MS (multiple sclerosis) (HCC) 2005  . Abdominal wall hernia 09/05/2014  . 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 Edema leg 09/05/2014  . 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 Neurogenic bowel 08/12/2012  . Neuropathic pain 05/21/2015    Past Surgical History  Procedure Laterality Date  . Revision urostomy cutaneous  06/2012  . Back surgery  2004  . Abdominal hysterectomy  2001    Family History  Problem Relation Age of Onset  . Osteoarthritis Mother   . Hypertension Mother   . Hyperlipidemia Mother   . Thyroid disease Mother   . Cancer Father   . Asthma Brother     Social History:  reports that she has never smoked. She does not have any smokeless tobacco history on file. She reports that she does not drink alcohol or use illicit drugs.  Allergies:  Allergies  Allergen Reactions  . Latex  Swelling    Facial swelling    Medications reviewed.    ROS  A multisystem review of systems was completed. All pertinent positives and negatives within the History of present illness remainder are negative.  BP 110/78 mmHg  Pulse 92  Temp(Src) 97.7 F (36.5 C) (Oral)  Ht 5\' 4"  (1.626 m)  Wt 84.823 kg (187 lb)  BMI 32.08 kg/m2  Physical Exam  Gen.: Sitting in her wheelchair in no acute distress  chest: Clear to auscultation Heart: Regular rhythm Abdomen: Soft, mildly tender to palpation in the right lower quadrant around her urostomy site, peristomal hernia palpated easily reduced, nondistended with active bowel sounds.   No results found for this or any previous visit (from the past 48 hour(s)). No results found.  Assessment/Plan:  1. Abdominal pain, chronic, right lower quadrant Unsure pain secondary to urinary tract infection versus parastomal hernia. - Urinalysis w microscopic + reflex cultur; Future  2. Urinary tract infection without hematuria, site unspecified Patient with continued symptoms despite ABX. Will repeat UA and obtain culture. Will call with results.  3. 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 Discussed that the best location for repair would be with the surgeons who created her urostomy. Will work to get her follow up scheduled at [facility] with her surgical team there.     Ricarda Frame  07/31/2015,negative

## 2015-08-03 ENCOUNTER — Telehealth: Payer: Self-pay

## 2015-08-03 NOTE — Telephone Encounter (Signed)
Called patient to let her know that after reviewing her chart, I noticed that the lab never sent her urine out for culture. I asked her if she was still having some UTI symptoms. Patient stated that the only thing that she was having was sweats. I told her what had happened and that the lab never sent her urine to be cultured. I told her that I was going to ask Dr. Tonita Cong to see if we needed to send out her urine for culture. Patient wanted me to know that she has transportation issues and that she has to call two days in advance at the [employer] for them to pick her up. I told her that once Dr. Tonita Cong replied to my e-mail, that I would call her and let her know. Patient agreed.

## 2015-08-03 NOTE — Telephone Encounter (Signed)
Patient has an appointment at [facility] with Deatra James, NP (Dr. Donnella Bi) on 08/25/2015 at 10:30 AM. Patient and Dr. Tonita Cong were notified about this appointment.  Patient's chart was faxed to [phone] for [facility] to review and treat.

## 2015-08-04 NOTE — Telephone Encounter (Signed)
Dr. Tonita Cong stated that if urine culture was not possible, than that was okay. I also told him that patient would be seen by her [facility] urologist and he was happy. I called patient to let her know that we did not need her urine sample and to remember to go to her [facility] urology appointment. Patient understood.

## 2015-08-05 ENCOUNTER — Telehealth: Payer: Self-pay

## 2015-08-05 NOTE — Telephone Encounter (Signed)
[facility] at [phone] to ask them for their fax number so I could fax [phone]) them patient's last office visit, labs and imaging.  I had to call [facility] since I received a call from [facility] letting me know that I had faxed patient's information to the wrong department.

## 2015-08-09 ENCOUNTER — Inpatient Hospital Stay
Admission: EM | Admit: 2015-08-09 | Discharge: 2015-08-12 | DRG: 690 | Disposition: A | Discharge status: Home Health | Payer: Medicare Other | Attending: Internal Medicine | Admitting: Internal Medicine

## 2015-08-09 ENCOUNTER — Emergency Department: Payer: Medicare Other

## 2015-08-09 ENCOUNTER — Encounter: Payer: Self-pay | Admitting: Emergency Medicine

## 2015-08-09 DIAGNOSIS — Z79899 Other long term (current) drug therapy: Secondary | ICD-10-CM | POA: Diagnosis not present

## 2015-08-09 DIAGNOSIS — K5900 Constipation, unspecified: Secondary | ICD-10-CM | POA: Diagnosis present

## 2015-08-09 DIAGNOSIS — E669 Obesity, unspecified: Secondary | ICD-10-CM | POA: Diagnosis present

## 2015-08-09 DIAGNOSIS — Z6835 Body mass index (BMI) 35.0-35.9, adult: Secondary | ICD-10-CM

## 2015-08-09 DIAGNOSIS — K592 Neurogenic bowel, not elsewhere classified: Secondary | ICD-10-CM | POA: Diagnosis present

## 2015-08-09 DIAGNOSIS — G35 Multiple sclerosis: Secondary | ICD-10-CM | POA: Diagnosis present

## 2015-08-09 DIAGNOSIS — Z79891 Long term (current) use of opiate analgesic: Secondary | ICD-10-CM | POA: Diagnosis not present

## 2015-08-09 DIAGNOSIS — Z91040 Latex allergy status: Secondary | ICD-10-CM | POA: Diagnosis not present

## 2015-08-09 DIAGNOSIS — N390 Urinary tract infection, site not specified: Secondary | ICD-10-CM | POA: Diagnosis not present

## 2015-08-09 DIAGNOSIS — G629 Polyneuropathy, unspecified: Secondary | ICD-10-CM | POA: Diagnosis present

## 2015-08-09 DIAGNOSIS — R109 Unspecified abdominal pain: Secondary | ICD-10-CM

## 2015-08-09 LAB — URINALYSIS COMPLETE WITH MICROSCOPIC (ARMC ONLY)
BILIRUBIN URINE: NEGATIVE
Glucose, UA: NEGATIVE mg/dL
HGB URINE DIPSTICK: NEGATIVE
Ketones, ur: NEGATIVE mg/dL
NITRITE: NEGATIVE
PH: 7 (ref 5.0–8.0)
PROTEIN: 30 mg/dL — AB
Specific Gravity, Urine: 1.009 (ref 1.005–1.030)

## 2015-08-09 LAB — COMPREHENSIVE METABOLIC PANEL
ALBUMIN: 3.9 g/dL (ref 3.5–5.0)
ALK PHOS: 45 U/L (ref 38–126)
ALT: 15 U/L (ref 14–54)
ANION GAP: 9 (ref 5–15)
AST: 16 U/L (ref 15–41)
BUN: 11 mg/dL (ref 6–20)
CALCIUM: 9.4 mg/dL (ref 8.9–10.3)
CO2: 23 mmol/L (ref 22–32)
CREATININE: 0.48 mg/dL (ref 0.44–1.00)
Chloride: 105 mmol/L (ref 101–111)
GFR calc non Af Amer: >60 mL/min (ref >60)
GLUCOSE: 98 mg/dL (ref 65–99)
POTASSIUM: 4.1 mmol/L (ref 3.5–5.1)
SODIUM: 137 mmol/L (ref 135–145)
Total Bilirubin: 1.3 mg/dL — ABNORMAL HIGH (ref 0.3–1.2)
Total Protein: 8.2 g/dL — ABNORMAL HIGH (ref 6.5–8.1)

## 2015-08-09 LAB — CBC
HCT: 46.7 % (ref 35.0–47.0)
HEMOGLOBIN: 15 g/dL (ref 12.0–16.0)
MCH: 29.3 pg (ref 26.0–34.0)
MCHC: 32 g/dL (ref 32.0–36.0)
MCV: 91.5 fL (ref 80.0–100.0)
PLATELETS: 324 10*3/uL (ref 150–440)
RBC: 5.11 MIL/uL (ref 3.80–5.20)
RDW: 14.3 % (ref 11.5–14.5)
WBC: 7.9 10*3/uL (ref 3.6–11.0)

## 2015-08-09 LAB — LIPASE, BLOOD: Lipase: 25 U/L (ref 11–51)

## 2015-08-09 LAB — POCT PREGNANCY, URINE: Preg Test, Ur: NEGATIVE

## 2015-08-09 MED ORDER — DOCUSATE SODIUM 100 MG PO CAPS
100.0000 mg | ORAL_CAPSULE | Freq: Two times a day (BID) | ORAL | Status: DC
Start: 2015-08-09 — End: 2015-08-12
  Administered 2015-08-09 – 2015-08-12 (×6): 100 mg via ORAL
  Filled 2015-08-09 (×6): qty 1

## 2015-08-09 MED ORDER — DULOXETINE HCL 20 MG PO CPEP
20.0000 mg | ORAL_CAPSULE | Freq: Every day | ORAL | Status: DC
  Administered 2015-08-09 – 2015-08-10 (×2): 20 mg via ORAL
  Filled 2015-08-09 (×2): qty 1

## 2015-08-09 MED ORDER — SODIUM CHLORIDE 0.9 % IV SOLN
INTRAVENOUS | Status: DC
  Administered 2015-08-09 – 2015-08-12 (×7): via INTRAVENOUS

## 2015-08-09 MED ORDER — BISACODYL 10 MG RE SUPP
10.0000 mg | Freq: Every day | RECTAL | Status: DC | PRN
  Administered 2015-08-10 – 2015-08-12 (×2): 10 mg via RECTAL
  Filled 2015-08-09 (×3): qty 1

## 2015-08-09 MED ORDER — ACETAMINOPHEN 325 MG PO TABS: 650.0000 mg | ORAL_TABLET | Freq: Four times a day (QID) | ORAL | Status: DC | PRN

## 2015-08-09 MED ORDER — VITAMIN D 1000 UNITS PO TABS
5000.0000 [IU] | ORAL_TABLET | ORAL | Status: DC
  Administered 2015-08-10: 5000 [IU] via ORAL
  Filled 2015-08-09: qty 5

## 2015-08-09 MED ORDER — BACLOFEN 10 MG PO TABS
20.0000 mg | ORAL_TABLET | Freq: Three times a day (TID) | ORAL | Status: DC
  Administered 2015-08-09 – 2015-08-12 (×8): 20 mg via ORAL
  Filled 2015-08-09 (×8): qty 2

## 2015-08-09 MED ORDER — GABAPENTIN 600 MG PO TABS
600.0000 mg | ORAL_TABLET | Freq: Three times a day (TID) | ORAL | Status: DC
  Administered 2015-08-09 – 2015-08-12 (×8): 600 mg via ORAL
  Filled 2015-08-09 (×8): qty 1

## 2015-08-09 MED ORDER — ONDANSETRON HCL 4 MG/2ML IJ SOLN: 4.0000 mg | Freq: Four times a day (QID) | INTRAMUSCULAR | Status: DC | PRN

## 2015-08-09 MED ORDER — SODIUM CHLORIDE 0.9 % IV SOLN
1.0000 g | INTRAVENOUS | Status: AC
  Administered 2015-08-09 – 2015-08-10 (×2): 1 g via INTRAVENOUS
  Filled 2015-08-09 (×2): qty 1

## 2015-08-09 MED ORDER — DIMETHYL FUMARATE 240 MG PO CPDR: 240.0000 mg | DELAYED_RELEASE_CAPSULE | Freq: Two times a day (BID) | ORAL | Status: DC

## 2015-08-09 MED ORDER — HEPARIN SODIUM (PORCINE) 5000 UNIT/ML IJ SOLN
5000.0000 [IU] | Freq: Three times a day (TID) | INTRAMUSCULAR | Status: DC
  Administered 2015-08-10 – 2015-08-11 (×5): 5000 [IU] via SUBCUTANEOUS
  Filled 2015-08-09 (×5): qty 1

## 2015-08-09 MED ORDER — AMANTADINE HCL 100 MG PO CAPS
100.0000 mg | ORAL_CAPSULE | Freq: Two times a day (BID) | ORAL | Status: DC
  Administered 2015-08-09 – 2015-08-12 (×6): 100 mg via ORAL
  Filled 2015-08-09 (×6): qty 1

## 2015-08-09 MED ORDER — MORPHINE SULFATE (PF) 4 MG/ML IV SOLN
4.0000 mg | Freq: Once | INTRAVENOUS | Status: AC
  Administered 2015-08-09: 4 mg via INTRAVENOUS
  Filled 2015-08-09: qty 1

## 2015-08-09 MED ORDER — VITAMIN D3 1.25 MG (50000 UT) PO CAPS: 1.0000 | ORAL_CAPSULE | ORAL | Status: DC

## 2015-08-09 MED ORDER — HEPARIN SODIUM (PORCINE) 5000 UNIT/ML IJ SOLN
5000.0000 [IU] | Freq: Three times a day (TID) | INTRAMUSCULAR | Status: DC
  Administered 2015-08-09: 5000 [IU] via SUBCUTANEOUS
  Filled 2015-08-09: qty 1

## 2015-08-09 MED ORDER — CEFTRIAXONE SODIUM 1 G IJ SOLR
1.0000 g | Freq: Once | INTRAMUSCULAR | Status: AC
  Administered 2015-08-09: 1 g via INTRAVENOUS
  Filled 2015-08-09: qty 10

## 2015-08-09 MED ORDER — ONDANSETRON HCL 4 MG/2ML IJ SOLN
4.0000 mg | Freq: Once | INTRAMUSCULAR | Status: AC
  Administered 2015-08-09: 4 mg via INTRAVENOUS
  Filled 2015-08-09: qty 2

## 2015-08-09 MED ORDER — ACETAMINOPHEN 650 MG RE SUPP: 650.0000 mg | Freq: Four times a day (QID) | RECTAL | Status: DC | PRN

## 2015-08-09 MED ORDER — PANTOPRAZOLE SODIUM 40 MG PO TBEC
40.0000 mg | DELAYED_RELEASE_TABLET | Freq: Every day | ORAL | Status: DC
  Administered 2015-08-09 – 2015-08-12 (×4): 40 mg via ORAL
  Filled 2015-08-09 (×4): qty 1

## 2015-08-09 MED ORDER — SODIUM CHLORIDE 0.9 % IV SOLN
1000.0000 mL | Freq: Once | INTRAVENOUS | Status: AC
  Administered 2015-08-09: 1000 mL via INTRAVENOUS

## 2015-08-09 MED ORDER — ONDANSETRON HCL 4 MG PO TABS: 4.0000 mg | ORAL_TABLET | Freq: Four times a day (QID) | ORAL | Status: DC | PRN

## 2015-08-09 MED ORDER — HYDROCODONE-ACETAMINOPHEN 5-325 MG PO TABS
1.0000 | ORAL_TABLET | ORAL | Status: DC | PRN
  Administered 2015-08-09 – 2015-08-10 (×2): 2 via ORAL
  Administered 2015-08-12: 1 via ORAL
  Filled 2015-08-09 (×3): qty 2

## 2015-08-09 NOTE — ED Notes (Signed)
Patient transported to X-ray 

## 2015-08-09 NOTE — H&P (Signed)
History and Physical    [name]:[id number] DOB: August 08, 1971 DOA: 08/09/2015  Referring physician: Dr. Cyril Loosen PCP: Gladis Riffle, MD  Specialists: none  Chief Complaint: diffuse pain and chills.  HPI: [name] is a 43 y.o. female has a past medical history significant for MS and recurrent UTI's now with diffuse pain and weakness with chills despite po Bactrim for UTI. MS is flaring at this time. Some nausea and diffuse weakness. No actual fever. UA grossly abnormal in ER and patient is tachycardic. She is now admitted for further evaluation.  Review of Systems: The patient denies anorexia, fever, weight loss,, vision loss, decreased hearing, hoarseness, chest pain, syncope, dyspnea on exertion, peripheral edema, balance deficits, hemoptysis, abdominal pain, melena, hematochezia, severe indigestion/heartburn, hematuria, incontinence, genital sores,  suspicious skin lesions, transient blindness, difficulty walking, depression, unusual weight change, abnormal bleeding, enlarged lymph nodes, angioedema, and breast masses.   Past Medical History  Diagnosis Date  . MS (multiple sclerosis) (HCC) 2005  . Abdominal wall hernia 09/05/2014  . 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 Edema leg 09/05/2014  . 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 Neurogenic bowel 08/12/2012  . Neuropathic pain 05/21/2015   Past Surgical History  Procedure Laterality Date  . Revision urostomy cutaneous  06/2012  . Back surgery  2004  . Abdominal hysterectomy  2001   Social History:  reports that she has never smoked. She does not have any smokeless tobacco history on file. She reports that she does not drink alcohol or use illicit drugs.  Allergies  Allergen Reactions  . Latex Swelling    Facial swelling    Family History  Problem Relation Age of Onset  . Osteoarthritis Mother   . Hypertension Mother    . Hyperlipidemia Mother   . Thyroid disease Mother   . Cancer Father   . Asthma Brother     Prior to Admission medications   Medication Sig Start Date End Date Taking? Authorizing Provider  amantadine (SYMMETREL) 100 MG capsule Take 100 mg by mouth 2 (two) times daily. 07/06/15   Historical Provider, MD  baclofen (LIORESAL) 20 MG tablet Take 20 mg by mouth 3 (three) times daily. 06/22/15   Historical Provider, MD  Cholecalciferol (VITAMIN D3) 50000 UNITS CAPS Take 1 capsule by mouth once a week. 04/29/15   Historical Provider, MD  DULoxetine (CYMBALTA) 20 MG capsule  04/25/15   Historical Provider, MD  Flaxseed, Linseed, (GNP FLAX SEED OIL) 1000 MG CAPS Take 1,000 mg by mouth daily.    Historical Provider, MD  gabapentin (NEURONTIN) 600 MG tablet Take 600 mg by mouth 3 (three) times daily. 03/04/15   Historical Provider, MD  HYDROcodone-acetaminophen (NORCO/VICODIN) 5-325 MG tablet Take 1-2 tablets by mouth every 4 (four) hours as needed for moderate pain. 07/25/15   Ricarda Frame, MD  ondansetron (ZOFRAN-ODT) 4 MG disintegrating tablet Take 1 tablet (4 mg total) by mouth every 6 (six) hours as needed for nausea. 07/25/15   Ricarda Frame, MD  sulfamethoxazole-trimethoprim (BACTRIM DS,SEPTRA DS) 800-160 MG tablet Take 1 tablet by mouth every 12 (twelve) hours. 07/25/15   Ricarda Frame, MD  TECFIDERA 240 MG CPDR  04/28/15   Historical Provider, MD   Physical Exam: Filed Vitals:   08/09/15 1204  BP: 141/60  Pulse: 113  Temp: 98.6 F (37 C)  TempSrc: Oral  Resp: 20  Height:  (1.626 m)  Weight: 81.647  kg (180 lb)  SpO2: 97%     General:  Mild  distress  Eyes: PERRL, EOMI, no scleral icterus  ENT: moist oropharynx  Neck: supple, no lymphadenopathy  Cardiovascular: regular rate without MRG; 2+ peripheral pulses, no JVD, no peripheral edema  Respiratory: CTA biL, good air movement without wheezing, rhonchi or crackled  Abdomen: soft, mildly tender to palpation, positive  bowel sounds, no guarding, no rebound  Skin: no rashes  Musculoskeletal: normal bulk and tone, no joint swelling  Psychiatric: normal mood and affect  Neurologic: CN 2-12 grossly intact, MS 4/5 in all 4  Labs on Admission:  Basic Metabolic Panel:  Recent Labs Lab 08/09/15 1234  NA 137  K 4.1  CL 105  CO2 23  GLUCOSE 98  BUN 11  CREATININE 0.48  CALCIUM 9.4   Liver Function Tests:  Recent Labs Lab 08/09/15 1234  AST 16  ALT 15  ALKPHOS 45  BILITOT 1.3*  PROT 8.2*  ALBUMIN 3.9    Recent Labs Lab 08/09/15 1234  LIPASE 25   No results for input(s): AMMONIA in the last 168 hours. CBC:  Recent Labs Lab 08/09/15 1234  WBC 7.9  HGB 15.0  HCT 46.7  MCV 91.5  PLT 324   Cardiac Enzymes: No results for input(s): CKTOTAL, CKMB, CKMBINDEX, TROPONINI in the last 168 hours.  BNP (last 3 results) No results for input(s): BNP in the last 8760 hours.  ProBNP (last 3 results) No results for input(s): PROBNP in the last 8760 hours.  CBG: No results for input(s): GLUCAP in the last 168 hours.  Radiological Exams on Admission: Dg Abd 2 Views  08/09/2015  CLINICAL DATA:  Acute right abdominal pain. History bowel obstruction. EXAM: ABDOMEN - 2 VIEW COMPARISON:  07/21/2015 FINDINGS: clear lung bases. No free air evident. Scattered air and stool throughout the bowel. Moderate retained stool throughout the colon. No definite obstruction pattern or ileus. Lower lumbar fusion hardware noted. No abnormal calcifications or acute osseous finding. IMPRESSION: Moderate retained stool throughout the colon. Negative for bowel obstruction or free air. Electronically Signed   By: Judie Petit.  Shick M.D.   On: 08/09/2015 14:21    EKG: Independently reviewed.  Assessment/Plan Principal Problem:   Complicated UTI (urinary tract infection) Active Problems:   Exacerbation of multiple sclerosis (HCC)   Will send cultures and begin IV ABX and IV fluids. PT eval ordered. Repeat labs in  AM.  Diet: low salt Fluids: NS@100  DVT Prophylaxis: SQ Heparin  Code Status: FULL  Family Communication: yes  Disposition Plan: home  Time spent: 50 min

## 2015-08-09 NOTE — ED Provider Notes (Addendum)
[facility] Emergency Department Provider Note  ____________________________________________  Time seen: On arrival  I have reviewed the triage vital signs and the nursing notes.   HISTORY  Chief Complaint Abdominal Pain    HPI [name] is a 44 y.o. female who presents with lower abdominal discomfort. She has a history of multiple sclerosis and has a uroileostomy. She was recently admitted to the hospital for small bowel obstruction which improved with NG tube. She is also diagnosed with urinary tract infection for which she was treated with Bactrim as an outpatient. She continues to complain of foul-smelling urine and chills and weakness. She has been compliant with medication. She denies nausea or vomiting. She had her last bowel movement 2-3 days ago she somewhat normal for her. She complains of discomfort at the site of her ostomy hernia     Past Medical History  Diagnosis Date  . MS (multiple sclerosis) (HCC) 2005  . Abdominal wall hernia 09/05/2014  . 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 Edema leg 09/05/2014  . 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 Neurogenic bowel 08/12/2012  . Neuropathic pain 05/21/2015    Patient Active Problem List   Diagnosis Date Noted  . 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 Multiple sclerosis (HCC)   . Neuropathic pain 05/21/2015  . 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 History of spinal surgery 04/29/2015  . Edema leg 09/05/2014  . Adiposity 09/05/2014  . Abdominal wall hernia 09/05/2014  . 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 Neurogenic bowel 08/12/2012  . Infection of urinary tract 04/07/2006  . 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 Revision urostomy cutaneous  06/2012  . Back surgery   2004  . Abdominal hysterectomy  2001    Current Outpatient Rx  Name  Route  Sig  Dispense  Refill  . amantadine (SYMMETREL) 100 MG capsule   Oral   Take 100 mg by mouth 2 (two) times daily.      5   . baclofen (LIORESAL) 20 MG tablet   Oral   Take 20 mg by mouth 3 (three) times daily.         . Cholecalciferol (VITAMIN D3) 50000 UNITS CAPS   Oral   Take 1 capsule by mouth once a week.      0   . DULoxetine (CYMBALTA) 20 MG capsule               . Flaxseed, Linseed, (GNP FLAX SEED OIL) 1000 MG CAPS   Oral   Take 1,000 mg by mouth daily.         Marland Kitchen gabapentin (NEURONTIN) 600 MG tablet   Oral   Take 600 mg by mouth 3 (three) times daily.         Marland Kitchen HYDROcodone-acetaminophen (NORCO/VICODIN) 5-325 MG tablet   Oral   Take 1-2 tablets by mouth every 4 (four) hours as needed for moderate pain.   30 tablet   0   . ondansetron (ZOFRAN-ODT) 4 MG disintegrating tablet   Oral   Take 1 tablet (4 mg total) by mouth every 6 (six) hours as needed for nausea.   20 tablet   0   . sulfamethoxazole-trimethoprim (BACTRIM DS,SEPTRA DS) 800-160 MG tablet   Oral   Take 1 tablet by mouth every 12 (twelve)  hours.   20 tablet   0   . TECFIDERA 240 MG CPDR                 Dispense as written.     Allergies Latex  Family History  Problem Relation Age of Onset  . Osteoarthritis Mother   . Hypertension Mother   . Hyperlipidemia Mother   . Thyroid disease Mother   . Cancer Father   . Asthma Brother     Social History Social History  Substance Use Topics  . Smoking status: Never Smoker   . Smokeless tobacco: None  . Alcohol Use: No    Review of Systems  Constitutional: Negative for fever. Positive chills Eyes: Negative for visual changes. ENT: Negative for sore throat Cardiovascular: Negative for chest pain. Respiratory: Negative for shortness of breath. Gastrointestinal: Positive for constipation, abdominal pain Genitourinary: Foul-smelling  urine Musculoskeletal: Negative for back pain. Skin: Negative for rash. Neurological: Negative for headaches or focal weakness Psychiatric: No anxiety    ____________________________________________   PHYSICAL EXAM:  VITAL SIGNS: ED Triage Vitals  Enc Vitals Group     BP 08/09/15 1204 141/60 mmHg     Pulse Rate 08/09/15 1204 113     Resp 08/09/15 1204 20     Temp 08/09/15 1204 98.6 F (37 C)     Temp Source 08/09/15 1204 Oral     SpO2 08/09/15 1204 97 %     Weight 08/09/15 1204 180 lb (81.647 kg)     Height 08/09/15 1204  (1.626 m)     Head Cir --      Peak Flow --      Pain Score 08/09/15 1347 8     Pain Loc --      Pain Edu? --      Excl. in GC? --      Constitutional: Alert and oriented. Well appearing and in no distress. Eyes: Conjunctivae are normal.  ENT   Head: Normocephalic and atraumatic.   Mouth/Throat: Mucous membranes are moist. Cardiovascular: Tachycardic, regular rhythm. Normal and symmetric distal pulses are present in all extremities. No murmurs, rubs, or gallops. Respiratory: Normal respiratory effort without tachypnea nor retractions. Breath sounds are clear and equal bilaterally.  Gastrointestinal: Mild tenderness to palpation at the 7:00 portion of the stoma, reducible hernia felt. No distention. There is no CVA tenderness. Genitourinary: deferred Musculoskeletal: Nontender with normal range of motion in all extremities. No lower extremity tenderness nor edema. Neurologic:  Normal speech and language. No gross focal neurologic deficits are appreciated. Skin:  Skin is warm, dry and intact. No rash noted. Psychiatric: Mood and affect are normal. Patient exhibits appropriate insight and judgment.  ____________________________________________    LABS (pertinent positives/negatives)  Labs Reviewed  COMPREHENSIVE METABOLIC PANEL - Abnormal; Notable for the following:    Total Protein 8.2 (*)    Total Bilirubin 1.3 (*)    All other  components within normal limits  URINALYSIS COMPLETEWITH MICROSCOPIC ([facility] ONLY) - Abnormal; Notable for the following:    Color, Urine YELLOW (*)    APPearance TURBID (*)    Protein, ur 30 (*)    Leukocytes, UA 3+ (*)    Bacteria, UA FEW (*)    Squamous Epithelial / LPF 0-5 (*)    All other components within normal limits  URINE CULTURE  CBC  LIPASE, BLOOD  POCT PREGNANCY, URINE    ____________________________________________   EKG  ED ECG REPORT I, Jene Every, the attending physician, personally viewed  and interpreted this ECG.  Date: 08/09/2015 EKG Time: 12:21 PM Rate: 111 Rhythm: Sinus tachycardia QRS Axis: normal Intervals: normal ST/T Wave abnormalities: Nonspecific Conduction Disutrbances: none Narrative Interpretation: unremarkable   ____________________________________________    RADIOLOGY I have personally reviewed any xrays that were ordered on this patient: Abdomen x-ray shows no evidence of small bowel obstruction  ____________________________________________   PROCEDURES  Procedure(s) performed: none  Critical Care performed: none  ____________________________________________   INITIAL IMPRESSION / ASSESSMENT AND PLAN / ED COURSE  Pertinent labs & imaging results that were available during my care of the patient were reviewed by me and considered in my medical decision making (see chart for details).  Abdominal x-ray shows no evidence of small bowel obstruction however urine continues to show grossly infected urine with no improvement even with antibody treatment. She has failed outpatient treatment and will require admission for further management. I've given her IV Rocephin and sent urine culture.  Discussed with the hospitalist  ____________________________________________   FINAL CLINICAL IMPRESSION(S) / ED DIAGNOSES  Final diagnoses:  Abdominal pain  Recurrent UTI (urinary tract infection)     Jene Every, MD 08/09/15  1542  Jene Every, MD 08/09/15 [phone]

## 2015-08-09 NOTE — ED Notes (Signed)
Patient brought in by [facility] c/o Right upper and lower abd pain. Past has recent hx/o Bowel obstruction. Was advised by her MD to come to ER for evaluation.

## 2015-08-10 LAB — COMPREHENSIVE METABOLIC PANEL
ALBUMIN: 3 g/dL — AB (ref 3.5–5.0)
ALK PHOS: 33 U/L — AB (ref 38–126)
ALT: 11 U/L — AB (ref 14–54)
ANION GAP: 6 (ref 5–15)
AST: 13 U/L — ABNORMAL LOW (ref 15–41)
BUN: 9 mg/dL (ref 6–20)
CALCIUM: 8.2 mg/dL — AB (ref 8.9–10.3)
CHLORIDE: 107 mmol/L (ref 101–111)
CO2: 25 mmol/L (ref 22–32)
CREATININE: 0.5 mg/dL (ref 0.44–1.00)
GFR calc non Af Amer: >60 mL/min (ref >60)
GLUCOSE: 93 mg/dL (ref 65–99)
Potassium: 3.9 mmol/L (ref 3.5–5.1)
SODIUM: 138 mmol/L (ref 135–145)
Total Bilirubin: 0.6 mg/dL (ref 0.3–1.2)
Total Protein: 6.4 g/dL — ABNORMAL LOW (ref 6.5–8.1)

## 2015-08-10 LAB — CBC
HCT: 36 % (ref 35.0–47.0)
HEMOGLOBIN: 12.1 g/dL (ref 12.0–16.0)
MCH: 30.9 pg (ref 26.0–34.0)
MCHC: 33.5 g/dL (ref 32.0–36.0)
MCV: 92.3 fL (ref 80.0–100.0)
Platelets: 301 10*3/uL (ref 150–440)
RBC: 3.9 MIL/uL (ref 3.80–5.20)
RDW: 14.2 % (ref 11.5–14.5)
WBC: 4.8 10*3/uL (ref 3.6–11.0)

## 2015-08-10 MED ORDER — BISACODYL 10 MG RE SUPP: 10.0000 mg | Freq: Once | RECTAL | Status: DC

## 2015-08-10 MED ORDER — POLYETHYLENE GLYCOL 3350 17 G PO PACK
17.0000 g | PACK | Freq: Every day | ORAL | Status: DC
  Administered 2015-08-10 – 2015-08-12 (×2): 17 g via ORAL
  Filled 2015-08-10 (×3): qty 1

## 2015-08-10 MED ORDER — CEPHALEXIN 500 MG PO CAPS
500.0000 mg | ORAL_CAPSULE | Freq: Two times a day (BID) | ORAL | Status: DC
  Administered 2015-08-11 – 2015-08-12 (×3): 500 mg via ORAL
  Filled 2015-08-10 (×3): qty 1

## 2015-08-10 MED ORDER — DULOXETINE HCL 30 MG PO CPEP
30.0000 mg | ORAL_CAPSULE | Freq: Two times a day (BID) | ORAL | Status: DC
  Administered 2015-08-10 – 2015-08-12 (×4): 30 mg via ORAL
  Filled 2015-08-10 (×4): qty 1

## 2015-08-10 NOTE — Progress Notes (Signed)
Paged Dr Sherryll Burger to inform that pharmacy stated that dulcolax suppository should only be given once daily; Dr acknowledged, ordered to give tomorrow in AM and also ordered miralax 17g daily, 1st dose now

## 2015-08-10 NOTE — Care Management Important Message (Signed)
Important Message  Patient Details  Name: ONEITA ZEHNDER MRN: [id number] Date of Birth: 1971-02-14   Medicare Important Message Given:  Yes    Adonis Huguenin, RN 08/10/2015, 10:27 AM

## 2015-08-10 NOTE — Progress Notes (Signed)
Per RN- Patient and Family stated that patient does not take Tecfidera (Dimethyl Fumarate) anymore.

## 2015-08-10 NOTE — Progress Notes (Signed)
Notified Dr Sherryll Burger of very small BM following suppository given this AM; Dr acknowledged, ordered to give another suppository

## 2015-08-10 NOTE — Progress Notes (Signed)
[facility] [place] [facility]   PATIENT NAME: [name]    MR#:  [id number]  DATE OF BIRTH:  25-May-1971  SUBJECTIVE:  CHIEF COMPLAINT:   Chief Complaint  Patient presents with  . Abdominal Pain   feeling much better, eating her breakfast.   REVIEW OF SYSTEMS:  Review of Systems  Constitutional: Negative for fever, weight loss, malaise/fatigue and diaphoresis.  HENT: Negative for ear discharge, ear pain, hearing loss, nosebleeds, sore throat and tinnitus.   Eyes: Negative for blurred vision and pain.  Respiratory: Negative for cough, hemoptysis, shortness of breath and wheezing.   Cardiovascular: Negative for chest pain, palpitations, orthopnea and leg swelling.  Gastrointestinal: Negative for heartburn, nausea, vomiting, abdominal pain, diarrhea, constipation and blood in stool.  Genitourinary: Negative for dysuria, urgency and frequency.  Musculoskeletal: Negative for myalgias and back pain.  Skin: Negative for itching and rash.  Neurological: Negative for dizziness, tingling, tremors, focal weakness, seizures, weakness and headaches.  Psychiatric/Behavioral: Negative for depression. The patient is not nervous/anxious.    DRUG ALLERGIES:   Allergies  Allergen Reactions  . Latex Swelling    Facial swelling   VITALS:  Blood pressure 120/54, pulse 97, temperature 98.2 F (36.8 C), temperature source Oral, resp. rate 18, height 5\' 4"  (1.626 m), weight 89.495 kg (197 lb 4.8 oz), SpO2 100 %. PHYSICAL EXAMINATION:  Physical Exam  Constitutional: She is oriented to person, place, and time and well-developed, well-nourished, and in no distress.  HENT:  Head: Normocephalic and atraumatic.  Eyes: Conjunctivae and EOM are normal. Pupils are equal, round, and reactive to light.  Neck: Normal range of motion. Neck supple. No tracheal deviation present. No thyromegaly present.  Cardiovascular: Normal rate, regular rhythm and normal heart sounds.    Pulmonary/Chest: Effort normal and breath sounds normal. No respiratory distress. She has no wheezes. She exhibits no tenderness.  Abdominal: Soft. Bowel sounds are normal. She exhibits no distension. There is no tenderness.  Musculoskeletal: Normal range of motion.  Neurological: She is alert and oriented to person, place, and time. No cranial nerve deficit.  Skin: Skin is warm and dry. No rash noted.  Psychiatric: Mood and affect normal.   LABORATORY PANEL:   CBC  Recent Labs Lab 08/10/15 0553  WBC 4.8  HGB 12.1  HCT 36.0  PLT 301   ------------------------------------------------------------------------------------------------------------------ Chemistries   Recent Labs Lab 08/10/15 0553  NA 138  K 3.9  CL 107  CO2 25  GLUCOSE 93  BUN 9  CREATININE 0.50  CALCIUM 8.2*  AST 13*  ALT 11*  ALKPHOS 33*  BILITOT 0.6   RADIOLOGY:  No results found. ASSESSMENT AND PLAN:   * UTI: Based on urinalysis.  Await urine culture.  Started on ertapenem.  We can probably switch her over to oral Keflex  * Possible multiple sclerosis flare: Doubt it , although possible as she reports with every infection This happens. Get a neurology evaluation  * Neuropathic pain: Continue gabapentin. Increased Cymbalta from 20 mg to 30 mg twice a day as per her home dosage.  Also on baclofen, which we will continue   All the records are reviewed and case discussed with Care Management/Social Worker. Management plans discussed with the patient, family and they are in agreement.  CODE STATUS: Full code  TOTAL TIME TAKING CARE OF THIS PATIENT: 25 minutes.   More than 50% of the time was spent in counseling/coordination of care: YES  POSSIBLE D/C IN 1-2 DAYS, DEPENDING  ON CLINICAL CONDITION.   [facility],  M.D on 08/10/2015 at 3:49 PM  Between 7am to 6pm - Pager - [phone]  After 6pm go to [email or web address] - password [email or web address] [facility]  [facility] [facility]  Office  [phone]  CC:  Primary care physician; Gladis Riffle, MD

## 2015-08-10 NOTE — Evaluation (Signed)
Physical Therapy Evaluation Patient Details Name: [name] MRN: [id number] DOB: 03-07-1971 Today's Date: 08/10/2015   History of Present Illness  Pt is a 44 yo female who was admitted to the hospital for UTI and abdominal pain. She was discharged from the hospital 1 week ago following small bowel obstruction.   Clinical Impression  Pt presents with hx of MS, edema, neurogenic bowel, neuropathic pain, and hernia. Examination reveals that pt performs bed mobility at mod A, transfers at max A, and ambulation was not possible today secondary to weakness. Her weakness appears to be much more profound in her LEs. Pt's family helps her out quite a bit, transferring her to/from her WC. Due to her current deficits she will continue to benefit from skilled PT in order to prevent any further functional decline. She is very pleasant to work with and willing to participate in therapy.    Follow Up Recommendations Home health PT;Supervision for mobility/OOB    Equipment Recommendations  None recommended by PT    Recommendations for Other Services       Precautions / Restrictions Precautions Precautions: Fall Restrictions Weight Bearing Restrictions: No      Mobility  Bed Mobility Overal bed mobility: Needs Assistance Bed Mobility: Supine to Sit     Supine to sit: Mod assist     General bed mobility comments: Pt needs fair assist to get all the way into sitting at EOB. Mostly LE assist but some trunk  Transfers Overall transfer level: Needs assistance Equipment used: Rolling walker (2 wheeled) Transfers: Sit to/from Stand Sit to Stand: Max assist         General transfer comment: Pt requires heavy assist of trunk but still can't get all the way into standing. Pt is very weak in her LEs.   Ambulation/Gait Ambulation/Gait assistance:  (Not appropriate secondary to weakness)              Stairs            Wheelchair Mobility    Modified Rankin (Stroke Patients  Only)       Balance Overall balance assessment: Needs assistance Sitting-balance support: Bilateral upper extremity supported Sitting balance-Leahy Scale: Good                                       Pertinent Vitals/Pain Pain Assessment: No/denies pain    Home Living Family/patient expects to be discharged to:: Private residence Living Arrangements: Parent Available Help at Discharge: Available 24 hours/day;Family Type of Home: House Home Access: Ramped entrance     Home Layout: One level Home Equipment: Walker - 2 wheels;Crutches;Bedside commode;Hospital bed;Shower seat - built in;Wheelchair - power;Wheelchair - manual      Prior Function Level of Independence: Needs assistance   Gait / Transfers Assistance Needed: Pt was max assist for transfers to/from wheelchair and bed  ADL's / Homemaking Assistance Needed: Mother assists with ADLs  Comments: Pt was ambulatory 3 years ago before her surgery; now she gets transfered into her [facility] where she can navigate her home      Hand Dominance        Extremity/Trunk Assessment   Upper Extremity Assessment: Overall WFL for tasks assessed           Lower Extremity Assessment: Generalized weakness (Grossly 2/5 MMT in bilateral LEs)         Communication   Communication: No difficulties  Cognition Arousal/Alertness: Awake/alert Behavior During Therapy: WFL for tasks assessed/performed Overall Cognitive Status: Within Functional Limits for tasks assessed                      General Comments      Exercises Other Exercises Other Exercises: Pt performed therex x 10 reps at min/mod A for facilitation of movement. Exercises included: ankle pumps, quad sets, glute sets, leg press into resistance from therapist, SAQ, and hip abd      Assessment/Plan    PT Assessment Patient needs continued PT services  PT Diagnosis Difficulty walking;Abnormality of gait;Generalized weakness   PT Problem List  Decreased strength;Decreased activity tolerance;Decreased balance;Decreased mobility;Decreased knowledge of use of DME  PT Treatment Interventions DME instruction;Gait training;Stair training;Functional mobility training;Therapeutic activities;Therapeutic exercise;Balance training;Neuromuscular re-education;Cognitive remediation   PT Goals (Current goals can be found in the Care Plan section) Acute Rehab PT Goals Patient Stated Goal: to return home PT Goal Formulation: With patient Time For Goal Achievement: 08/24/15 Potential to Achieve Goals: Fair    Frequency Min 2X/week   Barriers to discharge        Co-evaluation               End of Session Equipment Utilized During Treatment: Gait belt Activity Tolerance: Patient tolerated treatment well Patient left: in bed;with call bell/phone within reach;with bed alarm set Nurse Communication: Mobility status         Time: 6237-6283 PT Time Calculation (min) (ACUTE ONLY): 26 min   Charges:         PT G CodesBenna Dunks 2015-08-30, 1:03 PM  Benna Dunks, SPT. [phone]

## 2015-08-11 MED ORDER — ENOXAPARIN SODIUM 40 MG/0.4ML ~~LOC~~ SOLN
40.0000 mg | SUBCUTANEOUS | Status: DC
  Administered 2015-08-11: 40 mg via SUBCUTANEOUS
  Filled 2015-08-11 (×2): qty 0.4

## 2015-08-11 NOTE — Progress Notes (Signed)
Diffuse pain without focal deficits  No imaging  Significant UTI on labs  Improvement after antibiotics   Multiple sclerosis-  Stable, symptoms worsened by UTI not MS itself as there is no focality -  No steroids at this time -  No MRI at this time -  Ok to d/c if improvement -  Full note to follow

## 2015-08-11 NOTE — Clinical Social Work Note (Signed)
CSW consulted for nursing home placement. PT has assessed and recommended Home Health. RN CM is aware. CSW signing off. York Spaniel MSW,LCSW [phone]

## 2015-08-11 NOTE — Progress Notes (Signed)
[facility] [place] [facility]   PATIENT NAME: [name]    MR#:  [id number]  DATE OF BIRTH:  1971/09/01  SUBJECTIVE:  CHIEF COMPLAINT:   Chief Complaint  Patient presents with  . Abdominal Pain   feeling much better, some abdominal discomfort, mainly due to constipation.  Abdominal x-ray showing moderate stool burden REVIEW OF SYSTEMS:  Review of Systems  Constitutional: Negative for fever, weight loss, malaise/fatigue and diaphoresis.  HENT: Negative for ear discharge, ear pain, hearing loss, nosebleeds, sore throat and tinnitus.   Eyes: Negative for blurred vision and pain.  Respiratory: Negative for cough, hemoptysis, shortness of breath and wheezing.   Cardiovascular: Negative for chest pain, palpitations, orthopnea and leg swelling.  Gastrointestinal: Positive for abdominal pain and constipation. Negative for heartburn, nausea, vomiting, diarrhea and blood in stool.  Genitourinary: Negative for dysuria, urgency and frequency.  Musculoskeletal: Negative for myalgias and back pain.  Skin: Negative for itching and rash.  Neurological: Negative for dizziness, tingling, tremors, focal weakness, seizures, weakness and headaches.  Psychiatric/Behavioral: Negative for depression. The patient is not nervous/anxious.    DRUG ALLERGIES:   Allergies  Allergen Reactions  . Latex Swelling    Facial swelling   VITALS:  Blood pressure 120/54, pulse 90, temperature 97.9 F (36.6 C), temperature source Oral, resp. rate 16, height 5\' 4"  (1.626 m), weight 89.495 kg (197 lb 4.8 oz), SpO2 100 %. PHYSICAL EXAMINATION:  Physical Exam  Constitutional: She is oriented to person, place, and time and well-developed, well-nourished, and in no distress.  HENT:  Head: Normocephalic and atraumatic.  Eyes: Conjunctivae and EOM are normal. Pupils are equal, round, and reactive to light.  Neck: Normal range of motion. Neck supple. No tracheal deviation present. No  thyromegaly present.  Cardiovascular: Normal rate, regular rhythm and normal heart sounds.   Pulmonary/Chest: Effort normal and breath sounds normal. No respiratory distress. She has no wheezes. She exhibits no tenderness.  Abdominal: Soft. Bowel sounds are normal. She exhibits no distension. There is no tenderness.  Musculoskeletal: Normal range of motion.  Neurological: She is alert and oriented to person, place, and time. No cranial nerve deficit.  Skin: Skin is warm and dry. No rash noted.  Psychiatric: Mood and affect normal.   LABORATORY PANEL:   CBC  Recent Labs Lab 08/10/15 0553  WBC 4.8  HGB 12.1  HCT 36.0  PLT 301   ------------------------------------------------------------------------------------------------------------------ Chemistries   Recent Labs Lab 08/10/15 0553  NA 138  K 3.9  CL 107  CO2 25  GLUCOSE 93  BUN 9  CREATININE 0.50  CALCIUM 8.2*  AST 13*  ALT 11*  ALKPHOS 33*  BILITOT 0.6   RADIOLOGY:  No results found. ASSESSMENT AND PLAN:   * UTI: Based on urinalysis.  Await urine culture.  Continue oral Keflex  * Possible multiple sclerosis flare: Could be due to UTI, although she seems at her baseline at this time  * Neuropathic pain: Continue gabapentin. Increased Cymbalta from 20 mg to 30 mg twice a day as per her home dosage.  Also on baclofen, which we will continue  * Constipation: Add Colace and Dulcolax, continue MiraLAX  All the records are reviewed and case discussed with Care Management/Social Worker. Management plans discussed with the patient, family and they are in agreement.  CODE STATUS: Full code  TOTAL TIME TAKING CARE OF THIS PATIENT: 25 minutes.   More than 50% of the time was spent in counseling/coordination of care: YES  POSSIBLE D/C IN AM, DEPENDING ON CLINICAL CONDITION.  If gets a good bowel movement   [facility],  M.D on 08/11/2015 at 4:15 PM  Between 7am to 6pm - Pager - [phone]  After 6pm go to  [email or web address] - password [email or web address] [facility]  [place] [facility]  Office  [phone]  CC: Primary care physician; Gladis Riffle, MD

## 2015-08-12 MED ORDER — CEPHALEXIN 500 MG PO CAPS: 500.0000 mg | ORAL_CAPSULE | Freq: Two times a day (BID) | ORAL | Status: DC

## 2015-08-12 MED ORDER — POLYETHYLENE GLYCOL 3350 17 G PO PACK: 17.0000 g | PACK | Freq: Every day | ORAL | Status: DC

## 2015-08-12 MED ORDER — FLEET ENEMA 7-19 GM/118ML RE ENEM
1.0000 | ENEMA | RECTAL | Status: AC
  Administered 2015-08-12: 1 via RECTAL

## 2015-08-12 MED ORDER — SENNA 8.6 MG PO TABS: 2.0000 | ORAL_TABLET | Freq: Every evening | ORAL | Status: DC | PRN

## 2015-08-12 NOTE — Care Management (Signed)
Pt recommending home health PT.  Patient offered choice and [facility]. referral placed with Feliberto Gottron. Patient has Wheelchair an appropriate DME in the home. No other needs identified.

## 2015-08-12 NOTE — Discharge Instructions (Signed)
Constipation, Adult Constipation is when a person has fewer than three bowel movements a week, has difficulty having a bowel movement, or has stools that are dry, hard, or larger than normal. As people grow older, constipation is more common. A low-fiber diet, not taking in enough fluids, and taking certain medicines may make constipation worse.  CAUSES   Certain medicines, such as antidepressants, pain medicine, iron supplements, antacids, and water pills.   Certain diseases, such as diabetes, irritable bowel syndrome (IBS), thyroid disease, or depression.   Not drinking enough water.   Not eating enough fiber-rich foods.   Stress or travel.   Lack of physical activity or exercise.   Ignoring the urge to have a bowel movement.   Using laxatives too much.  SIGNS AND SYMPTOMS   Having fewer than three bowel movements a week.   Straining to have a bowel movement.   Having stools that are hard, dry, or larger than normal.   Feeling full or bloated.   Pain in the lower abdomen.   Not feeling relief after having a bowel movement.  DIAGNOSIS  Your health care provider will take a medical history and perform a physical exam. Further testing may be done for severe constipation. Some tests may include:  A barium enema X-ray to examine your rectum, colon, and, sometimes, your small intestine.   A sigmoidoscopy to examine your lower colon.   A colonoscopy to examine your entire colon. TREATMENT  Treatment will depend on the severity of your constipation and what is causing it. Some dietary treatments include drinking more fluids and eating more fiber-rich foods. Lifestyle treatments may include regular exercise. If these diet and lifestyle recommendations do not help, your health care provider may recommend taking over-the-counter laxative medicines to help you have bowel movements. Prescription medicines may be prescribed if over-the-counter medicines do not work.    HOME CARE INSTRUCTIONS   Eat foods that have a lot of fiber, such as fruits, vegetables, whole grains, and beans.  Limit foods high in fat and processed sugars, such as french fries, hamburgers, cookies, candies, and soda.   A fiber supplement may be added to your diet if you cannot get enough fiber from foods.   Drink enough fluids to keep your urine clear or pale yellow.   Exercise regularly or as directed by your health care provider.   Go to the restroom when you have the urge to go. Do not hold it.   Only take over-the-counter or prescription medicines as directed by your health care provider. Do not take other medicines for constipation without talking to your health care provider first.  SEEK IMMEDIATE MEDICAL CARE IF:   You have bright red blood in your stool.   Your constipation lasts for more than 4 days or gets worse.   You have abdominal or rectal pain.   You have thin, pencil-like stools.   You have unexplained weight loss. MAKE SURE YOU:   Understand these instructions.  Will watch your condition.  Will get help right away if you are not doing well or get worse.   This information is not intended to replace advice given to you by your health care provider. Make sure you discuss any questions you have with your health care provider.   Document Released: 06/10/2004 Document Revised: 10/03/2014 Document Reviewed: 06/24/2013 Elsevier Interactive Patient Education 2016 [employer].  Follow all MD discharge instructions. Take all medications as prescribed. Keep all follow up appointments. If your symptoms return,  call your doctor. If you experience any new symptoms that are of concern to you or that are bothersome to you, call your doctor. For all questions and/or concerns, call your doctor.   If you have a medical emergency, call 911

## 2015-08-12 NOTE — Progress Notes (Signed)
Pt d/c home; d/c instructions reviewed w/ pt; pt understanding was verbalized; IV removed catheter in tact, gauze dressing applied; all pt questions answered; pt left unit via wheelchair accompanied by staff 

## 2015-08-12 NOTE — Care Management Important Message (Signed)
Important Message  Patient Details  Name: [name] MRN: [id number] Date of Birth: [birth year]   Medicare Important Message Given:  Yes    Adonis Huguenin, RN 08/12/2015, 10:36 AM

## 2015-08-12 NOTE — Consult Note (Signed)
Reason for Consult: multiple sclerosis Referring Physician: Dr. Flossie Buffy is an 44 y.o. female.  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  Neurology was called to see if this was a multiple sclerosis flair.  Pt is seen by [facility] Neuro for multiple sclerosis and is not on any immunotherapy for the past few years due to type of disease.  Pt also has similar instances when she has a urinary problem.  Past Medical History  Diagnosis Date  . MS (multiple sclerosis) (HCC) 2005  . Abdominal wall hernia 09/05/2014  . 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 Edema leg 09/05/2014  . 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 Neurogenic bowel 08/12/2012  . Neuropathic pain 05/21/2015    Past Surgical History  Procedure Laterality Date  . Revision urostomy cutaneous  06/2012  . Back surgery  2004  . Abdominal hysterectomy  2001    Family History  Problem Relation Age of Onset  . Osteoarthritis Mother   . Hypertension Mother   . Hyperlipidemia Mother   . Thyroid disease Mother   . Cancer Father   . Asthma Brother     Social History:  reports that she has never smoked. She does not have any smokeless tobacco history on file. She reports that she does not drink alcohol or use illicit drugs.  Allergies:  Allergies  Allergen Reactions  . Latex Swelling    Facial swelling    Medications: personally reviewed by me  No results found for this or any previous visit (from the past 48 hour(s)).  No results found.  Review of Systems  Constitutional: Positive for fever. Negative for chills, weight loss, malaise/fatigue and diaphoresis.  HENT: Negative.   Eyes: Negative.   Respiratory: Negative.   Cardiovascular: Negative.   Gastrointestinal: Negative.   Genitourinary: Positive for dysuria and  urgency. Negative for frequency, hematuria and flank pain.  Musculoskeletal: Positive for myalgias and back pain. Negative for joint pain, falls and neck pain.  Skin: Negative.   Neurological: Positive for weakness. Negative for dizziness, tingling, tremors, sensory change, speech change, focal weakness, seizures and loss of consciousness.  Psychiatric/Behavioral: Negative.    Blood pressure 119/59, pulse 77, temperature 98.3 F (36.8 C), temperature source Oral, resp. rate 20, height 5\' 4"  (1.626 m), weight 92.761 kg (204 lb 8 oz), SpO2 100 %. Physical Exam  Nursing note and vitals reviewed. Constitutional: She appears well-developed and well-nourished. No distress.  HENT:  Head: Normocephalic and atraumatic.  Right Ear: External ear normal.  Left Ear: External ear normal.  Nose: Nose normal.  Mouth/Throat: Oropharynx is clear and moist.  Eyes: Conjunctivae and EOM are normal. Pupils are equal, round, and reactive to light. No scleral icterus.  Neck: Normal range of motion. Neck supple.  Cardiovascular: Normal rate, regular rhythm, normal heart sounds and intact distal pulses.   No murmur heard. Respiratory: Effort normal and breath sounds normal. No respiratory distress.  GI: Soft. Bowel sounds are normal. She exhibits no distension.  Musculoskeletal: Normal range of motion. She exhibits no edema.  Neurological:  A+Ox4, nl speech and language PERRLA, slight dysconjugate gaze, EOMI, nl VF, face symmetric, tongue midline 4/5 B, increased tone 2+/4 B, babinksi B Nl pin and temp  Skin: She is not diaphoretic.    Assessment/Plan: 1.  Primary progressive multiple  sclerosis-  Pt does not have typical flairs like relapsing remitting and would not benefit from immunotherapy -  No steroids -  No MRI -  Treat UTI -  Will sign off, please call with questions -  Needs to f/u with [facility] as scheduled  ,  08/12/2015, 9:38 AM

## 2015-08-13 LAB — URINE CULTURE

## 2015-08-17 NOTE — Discharge Summary (Signed)
[facility]   PATIENT NAME: [name]    MR#:  [id number]  DATE OF BIRTH:  1971/07/20  DATE OF ADMISSION:  08/09/2015 ADMITTING PHYSICIAN: Marguarite Arbour, MD  DATE OF DISCHARGE: 08/12/2015  5:15 PM  PRIMARY CARE PHYSICIAN: Gladis Riffle, MD    ADMISSION DIAGNOSIS:  Recurrent UTI (urinary tract infection) [N39.0] Abdominal pain [R10.9]  DISCHARGE DIAGNOSIS:  Principal Problem:   Complicated UTI (urinary tract infection) Active Problems:   Exacerbation of multiple sclerosis (HCC)   SECONDARY DIAGNOSIS:   Past Medical History  Diagnosis Date  . MS (multiple sclerosis) (HCC) 2005  . Abdominal wall hernia 09/05/2014  . 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 Edema leg 09/05/2014  . 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 Neurogenic bowel 08/12/2012  . Neuropathic pain 05/21/2015    HOSPITAL COURSE:  44 y.o. female has a past medical history significant for MS and recurrent UTI's admitted with diffuse pain and weakness with chills despite po Bactrim for UTI with concern for MS flare due to UTI. Please see Dr Wendee Copp dictated H & P for further details.  Neurology c/s was obtained who felt this to be Primary progressive multiple sclerosis. They felt that Pt does not have typical flairs like relapsing remitting and would not benefit from immunotherapy. They also recommended No steroids, No MRI and to continue treatment of her UTI.  She was also noted to have severe constipation and was resolved while in the Hospital with agressive bowel regimen. She was feeling much better and was D/C home in stable condition. She was agreeable with D/C plans. DISCHARGE CONDITIONS:   STABLE  CONSULTS OBTAINED:  Treatment Team:  Delfino Lovett, MD  DRUG ALLERGIES:   Allergies  Allergen Reactions  . Latex Swelling    Facial swelling    DISCHARGE  MEDICATIONS:   Discharge Medication List as of 08/12/2015  3:52 PM    START taking these medications   Details  cephALEXin (KEFLEX) 500 MG capsule Take 1 capsule (500 mg total) by mouth every 12 (twelve) hours., Starting 08/12/2015, Until Discontinued, Normal    polyethylene glycol (MIRALAX / GLYCOLAX) packet Take 17 g by mouth daily., Starting 08/12/2015, Until Discontinued, Normal    senna (SENOKOT) 8.6 MG TABS tablet Take 2 tablets (17.2 mg total) by mouth at bedtime as needed for moderate constipation., Starting 08/12/2015, Until Discontinued, Normal      CONTINUE these medications which have NOT CHANGED   Details  amantadine (SYMMETREL) 100 MG capsule Take 100 mg by mouth 2 (two) times daily., Starting 07/06/2015, Until Discontinued, Historical Med    baclofen (LIORESAL) 20 MG tablet Take 20 mg by mouth 3 (three) times daily., Starting 06/22/2015, Until Discontinued, Historical Med    Cholecalciferol (VITAMIN D3) 50000 UNITS CAPS Take 1 capsule by mouth once a week. Take on Monday., Starting 04/29/2015, Until Discontinued, Historical Med    DULoxetine (CYMBALTA) 30 MG capsule Take 30 mg by mouth 2 (two) times daily., Starting 05/21/2015, Until Discontinued, Historical Med    Flaxseed, Linseed, (GNP FLAX SEED OIL) 1000 MG CAPS Take 1,000 mg by mouth daily., Until Discontinued, Historical Med    gabapentin (NEURONTIN) 600 MG tablet Take 600 mg by mouth 3 (three) times daily., Starting 03/04/2015, Until Discontinued, Historical Med    HYDROcodone-acetaminophen (NORCO/VICODIN) 5-325 MG tablet Take 1-2 tablets by mouth every 4 (four) hours as needed for moderate pain.,  Starting 07/25/2015, Until Discontinued, Print    ondansetron (ZOFRAN-ODT) 4 MG disintegrating tablet Take 1 tablet (4 mg total) by mouth every 6 (six) hours as needed for nausea., Starting 07/25/2015, Until Discontinued, Print         DISCHARGE INSTRUCTIONS:   DIET:  Regular diet  DISCHARGE CONDITION:   Good  ACTIVITY:  Activity as tolerated  OXYGEN:  Home Oxygen: No.   Oxygen Delivery: room air  DISCHARGE LOCATION:  home   If you experience worsening of your admission symptoms, develop shortness of breath, life threatening emergency, suicidal or homicidal thoughts you must seek medical attention immediately by calling 911 or calling your MD immediately  if symptoms less severe.  You Must read complete instructions/literature along with all the possible adverse reactions/side effects for all the Medicines you take and that have been prescribed to you. Take any new Medicines after you have completely understood and accpet all the possible adverse reactions/side effects.   Please note  You were cared for by a hospitalist during your hospital stay. If you have any questions about your discharge medications or the care you received while you were in the hospital after you are discharged, you can call the unit and asked to speak with the hospitalist on call if the hospitalist that took care of you is not available. Once you are discharged, your primary care physician will handle any further medical issues. Please note that NO REFILLS for any discharge medications will be authorized once you are discharged, as it is imperative that you return to your primary care physician (or establish a relationship with a primary care physician if you do not have one) for your aftercare needs so that they can reassess your need for medications and monitor your lab values.    On the day of Discharge:  VITAL SIGNS:  Blood pressure 108/60, pulse 76, temperature 98 F (36.7 C), temperature source Oral, resp. rate 16, height  (1.626 m), weight 92.761 kg (204 lb 8 oz), SpO2 100 %. PHYSICAL EXAMINATION:  GENERAL:  44 y.o.-year-old patient lying in the bed with no acute distress.  EYES: Pupils equal, round, reactive to light and accommodation. No scleral icterus. Extraocular muscles intact.  HEENT: Head  atraumatic, normocephalic. Oropharynx and nasopharynx clear.  NECK:  Supple, no jugular venous distention. No thyroid enlargement, no tenderness.  LUNGS: Normal breath sounds bilaterally, no wheezing, rales,rhonchi or crepitation. No use of accessory muscles of respiration.  CARDIOVASCULAR: S1, S2 normal. No murmurs, rubs, or gallops.  ABDOMEN: Soft, non-tender, non-distended. Bowel sounds present. No organomegaly or mass.  EXTREMITIES: No pedal edema, cyanosis, or clubbing.  NEUROLOGIC: Cranial nerves II through XII are intact. Muscle strength 5/5 in all extremities. Sensation intact. Gait not checked.  PSYCHIATRIC: The patient is alert and oriented x 3.  SKIN: No obvious rash, lesion, or ulcer.  DATA REVIEW:   CBC No results for input(s): WBC, HGB, HCT, PLT in the last 168 hours.  Chemistries  No results for input(s): NA, K, CL, CO2, GLUCOSE, BUN, CREATININE, CALCIUM, MG, AST, ALT, ALKPHOS, BILITOT in the last 168 hours.  Invalid input(s): GFRCGP  Cardiac Enzymes No results for input(s): TROPONINI in the last 168 hours.  Microbiology Results  Results for orders placed or performed during the hospital encounter of 08/09/15  Urine culture     Status: None   Collection Time: 08/09/15 12:51 PM  Result Value Ref Range Status   Specimen Description URINE, RANDOM  Final   Special Requests NONE  Final   Culture   Final    >=100,000 COLONIES/mL ESCHERICHIA COLI 80,000 COLONIES/ml KLEBSIELLA OXYTOCA    Report Status 08/13/2015 FINAL  Final   Organism ID, Bacteria ESCHERICHIA COLI  Final   Organism ID, Bacteria KLEBSIELLA OXYTOCA  Final      Susceptibility   Escherichia coli - MIC*    AMPICILLIN >=32 RESISTANT Resistant     CEFTAZIDIME <=1 SENSITIVE Sensitive     CEFAZOLIN <=4 SENSITIVE Sensitive     CEFTRIAXONE <=1 SENSITIVE Sensitive     CIPROFLOXACIN 1 SENSITIVE Sensitive     GENTAMICIN <=1 SENSITIVE Sensitive     IMIPENEM <=0.25 SENSITIVE Sensitive     TRIMETH/SULFA >=320  RESISTANT Resistant     PIP/TAZO Value in next row Sensitive      SENSITIVE<=4    * >=100,000 COLONIES/mL ESCHERICHIA COLI   Klebsiella oxytoca - MIC*    AMPICILLIN Value in next row Resistant      SENSITIVE<=4    CEFTAZIDIME Value in next row Sensitive      SENSITIVE<=4    CEFAZOLIN Value in next row Sensitive      SENSITIVE<=4    CEFTRIAXONE Value in next row Sensitive      SENSITIVE<=4    CIPROFLOXACIN Value in next row Sensitive      SENSITIVE<=4    GENTAMICIN Value in next row Sensitive      SENSITIVE<=4    IMIPENEM Value in next row Sensitive      SENSITIVE<=4    TRIMETH/SULFA Value in next row Resistant      SENSITIVE<=4    PIP/TAZO Value in next row Sensitive      SENSITIVE<=4    * 80,000 COLONIES/ml KLEBSIELLA OXYTOCA    RADIOLOGY:  No results found.   Management plans discussed with the patient, family and they are in agreement.  CODE STATUS: Full Code  TOTAL TIME TAKING CARE OF THIS PATIENT: 55 minutes.    [facility],  M.D on 08/17/2015 at 1:42 PM  Between 7am to 6pm - Pager - [phone]  After 6pm go to [email or web address] - password [email or web address] [facility]  [facility]  Office  [phone]  [id number] care physician; Gladis Riffle, MD

## 2015-10-24 ENCOUNTER — Other Ambulatory Visit
Admission: RE | Admit: 2015-10-24 | Discharge: 2015-10-24 | Disposition: A | Discharge status: Home / Self Care | Payer: Medicare Other | Source: Ambulatory Visit | Attending: Physician Assistant | Admitting: Physician Assistant

## 2015-10-24 DIAGNOSIS — N319 Neuromuscular dysfunction of bladder, unspecified: Secondary | ICD-10-CM | POA: Diagnosis present

## 2015-10-24 DIAGNOSIS — K435 Parastomal hernia without obstruction or  gangrene: Secondary | ICD-10-CM | POA: Diagnosis present

## 2015-10-24 DIAGNOSIS — G35 Multiple sclerosis: Secondary | ICD-10-CM | POA: Insufficient documentation

## 2015-10-24 DIAGNOSIS — F329 Major depressive disorder, single episode, unspecified: Secondary | ICD-10-CM | POA: Diagnosis not present

## 2015-10-24 LAB — URINALYSIS COMPLETE WITH MICROSCOPIC (ARMC ONLY)
Bilirubin Urine: NEGATIVE
Glucose, UA: NEGATIVE mg/dL
KETONES UR: NEGATIVE mg/dL
NITRITE: POSITIVE — AB
PH: 6 (ref 5.0–8.0)
PROTEIN: 100 mg/dL — AB
Specific Gravity, Urine: 1.013 (ref 1.005–1.030)
Trans Epithel, UA: 1

## 2015-10-27 LAB — URINE CULTURE: Culture: >=100000

## 2016-01-12 ENCOUNTER — Inpatient Hospital Stay
Admission: EM | Admit: 2016-01-12 | Discharge: 2016-01-15 | DRG: 698 | Disposition: A | Discharge status: Home Health | Payer: Medicare Other | Attending: Internal Medicine | Admitting: Internal Medicine

## 2016-01-12 ENCOUNTER — Encounter: Payer: Self-pay | Admitting: Specialist

## 2016-01-12 DIAGNOSIS — Z825 Family history of asthma and other chronic lower respiratory diseases: Secondary | ICD-10-CM | POA: Diagnosis not present

## 2016-01-12 DIAGNOSIS — G8220 Paraplegia, unspecified: Secondary | ICD-10-CM | POA: Diagnosis present

## 2016-01-12 DIAGNOSIS — G629 Polyneuropathy, unspecified: Secondary | ICD-10-CM | POA: Diagnosis present

## 2016-01-12 DIAGNOSIS — E876 Hypokalemia: Secondary | ICD-10-CM | POA: Diagnosis present

## 2016-01-12 DIAGNOSIS — N319 Neuromuscular dysfunction of bladder, unspecified: Secondary | ICD-10-CM | POA: Diagnosis present

## 2016-01-12 DIAGNOSIS — F329 Major depressive disorder, single episode, unspecified: Secondary | ICD-10-CM | POA: Diagnosis present

## 2016-01-12 DIAGNOSIS — A419 Sepsis, unspecified organism: Secondary | ICD-10-CM | POA: Diagnosis present

## 2016-01-12 DIAGNOSIS — Z7401 Bed confinement status: Secondary | ICD-10-CM | POA: Diagnosis not present

## 2016-01-12 DIAGNOSIS — N12 Tubulo-interstitial nephritis, not specified as acute or chronic: Secondary | ICD-10-CM | POA: Diagnosis present

## 2016-01-12 DIAGNOSIS — N99528 Other complication of other external stoma of urinary tract: Secondary | ICD-10-CM | POA: Diagnosis present

## 2016-01-12 DIAGNOSIS — T83518A Infection and inflammatory reaction due to other urinary catheter, initial encounter: Secondary | ICD-10-CM | POA: Diagnosis present

## 2016-01-12 DIAGNOSIS — E669 Obesity, unspecified: Secondary | ICD-10-CM | POA: Diagnosis present

## 2016-01-12 DIAGNOSIS — Z8249 Family history of ischemic heart disease and other diseases of the circulatory system: Secondary | ICD-10-CM | POA: Diagnosis not present

## 2016-01-12 DIAGNOSIS — G35 Multiple sclerosis: Secondary | ICD-10-CM | POA: Diagnosis present

## 2016-01-12 DIAGNOSIS — Z91040 Latex allergy status: Secondary | ICD-10-CM | POA: Diagnosis not present

## 2016-01-12 DIAGNOSIS — N390 Urinary tract infection, site not specified: Secondary | ICD-10-CM | POA: Diagnosis present

## 2016-01-12 LAB — CBC WITH DIFFERENTIAL/PLATELET
BASOS PCT: 0 %
Basophils Absolute: 0 10*3/uL (ref 0–0.1)
EOS ABS: 0 10*3/uL (ref 0–0.7)
Eosinophils Relative: 0 %
HCT: 36.8 % (ref 35.0–47.0)
Hemoglobin: 12.3 g/dL (ref 12.0–16.0)
LYMPHS ABS: 0.9 10*3/uL — AB (ref 1.0–3.6)
Lymphocytes Relative: 5 %
MCH: 29.3 pg (ref 26.0–34.0)
MCHC: 33.5 g/dL (ref 32.0–36.0)
MCV: 87.7 fL (ref 80.0–100.0)
MONO ABS: 2.5 10*3/uL — AB (ref 0.2–0.9)
MONOS PCT: 14 %
NEUTROS ABS: 14.3 10*3/uL — AB (ref 1.4–6.5)
Neutrophils Relative %: 81 %
PLATELETS: 227 10*3/uL (ref 150–440)
RBC: 4.19 MIL/uL (ref 3.80–5.20)
RDW: 14.2 % (ref 11.5–14.5)
WBC: 17.7 10*3/uL — ABNORMAL HIGH (ref 3.6–11.0)

## 2016-01-12 LAB — URINALYSIS COMPLETE WITH MICROSCOPIC (ARMC ONLY)
Bilirubin Urine: NEGATIVE
Glucose, UA: NEGATIVE mg/dL
Nitrite: POSITIVE — AB
PH: 6 (ref 5.0–8.0)
PROTEIN: 100 mg/dL — AB
Specific Gravity, Urine: 1.013 (ref 1.005–1.030)

## 2016-01-12 LAB — COMPREHENSIVE METABOLIC PANEL
ALBUMIN: 3.6 g/dL (ref 3.5–5.0)
ALK PHOS: 55 U/L (ref 38–126)
ALT: 27 U/L (ref 14–54)
AST: 34 U/L (ref 15–41)
Anion gap: 12 (ref 5–15)
BILIRUBIN TOTAL: 1.1 mg/dL (ref 0.3–1.2)
BUN: 8 mg/dL (ref 6–20)
CALCIUM: 8.6 mg/dL — AB (ref 8.9–10.3)
CO2: 23 mmol/L (ref 22–32)
Chloride: 100 mmol/L — ABNORMAL LOW (ref 101–111)
Creatinine, Ser: 0.52 mg/dL (ref 0.44–1.00)
GFR calc Af Amer: >60 mL/min (ref >60)
GFR calc non Af Amer: >60 mL/min (ref >60)
GLUCOSE: 102 mg/dL — AB (ref 65–99)
POTASSIUM: 3.2 mmol/L — AB (ref 3.5–5.1)
Sodium: 135 mmol/L (ref 135–145)
TOTAL PROTEIN: 7.6 g/dL (ref 6.5–8.1)

## 2016-01-12 LAB — LACTIC ACID, PLASMA
LACTIC ACID, VENOUS: 0.7 mmol/L (ref 0.5–2.0)
LACTIC ACID, VENOUS: 1.4 mmol/L (ref 0.5–2.0)

## 2016-01-12 MED ORDER — ONDANSETRON HCL 4 MG/2ML IJ SOLN: 4.0000 mg | Freq: Four times a day (QID) | INTRAMUSCULAR | Status: DC | PRN

## 2016-01-12 MED ORDER — DEXTROSE 5 % IV SOLN
2.0000 g | Freq: Once | INTRAVENOUS | Status: AC
  Administered 2016-01-12: 2 g via INTRAVENOUS
  Filled 2016-01-12: qty 2

## 2016-01-12 MED ORDER — ONDANSETRON HCL 4 MG PO TABS: 4.0000 mg | ORAL_TABLET | Freq: Four times a day (QID) | ORAL | Status: DC | PRN

## 2016-01-12 MED ORDER — SODIUM CHLORIDE 0.9 % IV BOLUS (SEPSIS)
1000.0000 mL | INTRAVENOUS | Status: AC
  Administered 2016-01-12 (×3): 1000 mL via INTRAVENOUS

## 2016-01-12 MED ORDER — GABAPENTIN 600 MG PO TABS
600.0000 mg | ORAL_TABLET | Freq: Four times a day (QID) | ORAL | Status: DC
  Administered 2016-01-12 – 2016-01-15 (×11): 600 mg via ORAL
  Filled 2016-01-12 (×11): qty 1

## 2016-01-12 MED ORDER — CEFEPIME HCL 2 G IJ SOLR: 2.0000 g | Freq: Two times a day (BID) | INTRAMUSCULAR | Status: DC

## 2016-01-12 MED ORDER — DULOXETINE HCL 30 MG PO CPEP
30.0000 mg | ORAL_CAPSULE | Freq: Two times a day (BID) | ORAL | Status: DC
  Administered 2016-01-12 – 2016-01-15 (×6): 30 mg via ORAL
  Filled 2016-01-12 (×6): qty 1

## 2016-01-12 MED ORDER — POTASSIUM CHLORIDE CRYS ER 20 MEQ PO TBCR
20.0000 meq | EXTENDED_RELEASE_TABLET | Freq: Two times a day (BID) | ORAL | Status: AC
  Administered 2016-01-12 (×2): 20 meq via ORAL
  Filled 2016-01-12 (×2): qty 1

## 2016-01-12 MED ORDER — ACETAMINOPHEN 325 MG PO TABS
650.0000 mg | ORAL_TABLET | Freq: Four times a day (QID) | ORAL | Status: DC | PRN
  Administered 2016-01-12 – 2016-01-14 (×3): 650 mg via ORAL
  Filled 2016-01-12 (×3): qty 2

## 2016-01-12 MED ORDER — AMANTADINE HCL 100 MG PO CAPS
100.0000 mg | ORAL_CAPSULE | Freq: Two times a day (BID) | ORAL | Status: DC
  Administered 2016-01-12 – 2016-01-15 (×6): 100 mg via ORAL
  Filled 2016-01-12 (×7): qty 1

## 2016-01-12 MED ORDER — DEXTROSE 5 % IV SOLN
1.0000 g | INTRAVENOUS | Status: DC
  Administered 2016-01-12 – 2016-01-14 (×3): 1 g via INTRAVENOUS
  Filled 2016-01-12 (×4): qty 10

## 2016-01-12 MED ORDER — SODIUM CHLORIDE 0.9 % IV SOLN
INTRAVENOUS | Status: DC
  Administered 2016-01-12 – 2016-01-13 (×2): via INTRAVENOUS

## 2016-01-12 MED ORDER — ENOXAPARIN SODIUM 40 MG/0.4ML ~~LOC~~ SOLN
40.0000 mg | SUBCUTANEOUS | Status: DC
  Administered 2016-01-12 – 2016-01-14 (×3): 40 mg via SUBCUTANEOUS
  Filled 2016-01-12 (×3): qty 0.4

## 2016-01-12 MED ORDER — ACETAMINOPHEN 650 MG RE SUPP: 650.0000 mg | Freq: Four times a day (QID) | RECTAL | Status: DC | PRN

## 2016-01-12 MED ORDER — VITAMIN D (ERGOCALCIFEROL) 1.25 MG (50000 UNIT) PO CAPS: 50000.0000 [IU] | ORAL_CAPSULE | ORAL | Status: DC

## 2016-01-12 MED ORDER — BACLOFEN 10 MG PO TABS
20.0000 mg | ORAL_TABLET | Freq: Three times a day (TID) | ORAL | Status: DC
  Administered 2016-01-12 – 2016-01-15 (×9): 20 mg via ORAL
  Filled 2016-01-12 (×9): qty 2

## 2016-01-12 NOTE — ED Notes (Signed)
Delay in starting antibiotics and obtaining blood sample due to difficulty with obtaining IV access.

## 2016-01-12 NOTE — ED Notes (Signed)
1 blood culture complete from RN prior to myself, I gave report and notified RN taking pt of this, and Charge Rn

## 2016-01-12 NOTE — ED Notes (Signed)
Pt reports fever since last night. Reports having chills throughout the day.  Pt has MS

## 2016-01-12 NOTE — H&P (Signed)
[facility] [place] [facility]   PATIENT NAME: [name]    MR#:  [id number]  DATE OF BIRTH:  02/05/1971  DATE OF ADMISSION:  01/12/2016  PRIMARY CARE PHYSICIAN: Gladis Riffle, MD   REQUESTING/REFERRING PHYSICIAN: Gladstone Pih  CHIEF COMPLAINT:   Chief Complaint  Patient presents with  . Fever    HISTORY OF PRESENT ILLNESS:  [name]  is a 45 y.o. female with a known history of Progressive MS, bladder neurogenesis status post urostomy, neurogenic bowel, neuropathic pain, abdominal wall hernia presents to the hospital due to fever, chills. Patient says she developed a fever of 103 yesterday and took some Tylenol and came down but it came back up again early this morning. She called EMS and they brought her to the ER for further evaluation. Patient had a fever 103 here in the ER and was also noted to have a urinary tract infection. Patient was noted to be tachycardic and meets criteria for sepsis due to UTI and therefore hospitalist services were contacted further treatment and evaluation. Patient denies any chest pain, shortness of breath, nausea, vomiting, abdominal pain, diarrhea or any other associated symptoms presently. She does have a urostomy given her neurogenic bladder. She also said that she had some left arm weakness yesterday for fever but that has improved now.  PAST MEDICAL HISTORY:   Past Medical History  Diagnosis Date  . MS (multiple sclerosis) (HCC) 2005  . Abdominal wall hernia 09/05/2014  . 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 Edema leg 09/05/2014  . 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 Neurogenic bowel 08/12/2012  . Neuropathic pain 05/21/2015    PAST SURGICAL HISTORY:   Past Surgical History  Procedure Laterality Date  . Revision urostomy cutaneous  06/2012  . Back surgery  2004  . Abdominal hysterectomy  2001    SOCIAL HISTORY:    Social History  Substance Use Topics  . Smoking status: Never Smoker   . Smokeless tobacco: Not on file  . Alcohol Use: 0.0 oz/week    0 Standard drinks or equivalent per week    FAMILY HISTORY:   Family History  Problem Relation Age of Onset  . Osteoarthritis Mother   . Hypertension Mother   . Hyperlipidemia Mother   . Thyroid disease Mother   . Cancer Father   . Asthma Brother     DRUG ALLERGIES:   Allergies  Allergen Reactions  . Latex Swelling and Other (See Comments)    Reaction:  Facial swelling     REVIEW OF SYSTEMS:   Review of Systems  Constitutional: Positive for fever and chills. Negative for weight loss.  HENT: Negative for congestion, nosebleeds and tinnitus.   Eyes: Negative for blurred vision, double vision and redness.  Respiratory: Negative for cough, hemoptysis and shortness of breath.   Cardiovascular: Negative for chest pain, orthopnea, leg swelling and PND.  Gastrointestinal: Negative for nausea, vomiting, abdominal pain, diarrhea and melena.  Genitourinary: Negative for dysuria, urgency and hematuria.  Musculoskeletal: Negative for joint pain and falls.  Neurological: Negative for dizziness, tingling, sensory change, focal weakness, seizures, weakness and headaches.  Endo/Heme/Allergies: Negative for polydipsia. Does not bruise/bleed easily.  Psychiatric/Behavioral: Negative for depression and memory loss. The patient is not nervous/anxious.     MEDICATIONS AT HOME:   Prior to Admission medications   Medication Sig Start Date End Date Taking? Authorizing Provider  amantadine (SYMMETREL) 100  MG capsule Take 100 mg by mouth 2 (two) times daily.   Yes Historical Provider, MD  baclofen (LIORESAL) 20 MG tablet Take 20 mg by mouth 3 (three) times daily.   Yes Historical Provider, MD  DULoxetine (CYMBALTA) 30 MG capsule Take 30 mg by mouth 2 (two) times daily.   Yes Historical Provider, MD  gabapentin (NEURONTIN) 600 MG tablet Take 600 mg by mouth  4 (four) times daily.    Yes Historical Provider, MD  Vitamin D, Ergocalciferol, (DRISDOL) 50000 units CAPS capsule Take 50,000 Units by mouth every 7 (seven) days. Pt takes on Monday.   Yes Historical Provider, MD      VITAL SIGNS:  Blood pressure 126/73, pulse 110, temperature 103 F (39.4 C), temperature source Oral, resp. rate 23, weight 92.987 kg (205 lb), SpO2 97 %.  PHYSICAL EXAMINATION:  Physical Exam  GENERAL:  45 y.o.-year-old patient lying in the bed with no acute distress.  EYES: Pupils equal, round, reactive to light and accommodation. No scleral icterus. Extraocular muscles intact.  HEENT: Head atraumatic, normocephalic. Oropharynx and nasopharynx clear. No oropharyngeal erythema, moist oral mucosa  NECK:  Supple, no jugular venous distention. No thyroid enlargement, no tenderness.  LUNGS: Normal breath sounds bilaterally, no wheezing, rales, rhonchi. No use of accessory muscles of respiration.  CARDIOVASCULAR: S1, S2 RRR. No murmurs, rubs, gallops, clicks.  ABDOMEN: Soft, nontender, nondistended. Bowel sounds present. No organomegaly or mass.  EXTREMITIES: No pedal edema, cyanosis, or clubbing. + 2 pedal & radial pulses b/l.  Positive urostomy in place.  NEUROLOGIC: Cranial nerves II through XII are intact. Pt. bedbound due to MS.  5/5 strength on UE b/l but LE are 1/5 strenght b/l.   PSYCHIATRIC: The patient is alert and oriented x 3. Good affect.  SKIN: No obvious rash, lesion, or ulcer.   LABORATORY PANEL:   CBC  Recent Labs Lab 01/12/16 1343  WBC 17.7*  HGB 12.3  HCT 36.8  PLT 227   ------------------------------------------------------------------------------------------------------------------  Chemistries   Recent Labs Lab 01/12/16 1343  NA 135  K 3.2*  CL 100*  CO2 23  GLUCOSE 102*  BUN 8  CREATININE 0.52  CALCIUM 8.6*  AST 34  ALT 27  ALKPHOS 55  BILITOT 1.1    ------------------------------------------------------------------------------------------------------------------  Cardiac Enzymes No results for input(s): TROPONINI in the last 168 hours. ------------------------------------------------------------------------------------------------------------------  RADIOLOGY:  No results found.   IMPRESSION AND PLAN:   45 year old female with past medical history of progressive MS, neurogenic bladder status post urostomy, neuropathy, who presented to the hospital due to fever, tachycardia noted to be septic.  1. Sepsis-patient meets criteria given the tachycardia, fever, positive urinalysis. -I will treat the patient with IV ceftriaxone, follow urine, blood cultures. Follow hemodynamics.  2. Urinary tract infection-this is a cause of patient's sepsis. -Patient previously has a UTI due to Klebsiella, Escherichia coli. There were both sensitive to ceftriaxone which I will start the patient on. Follow urine cultures.  3. Leukocytosis-secondary to the sepsis/UTI. Follow with IV antibiotic therapy.  4. Hypokalemia-Place on oral potassium supplements. Repeat level in the morning.  5. Progressive MS-patient did have some left upper extremity weakness yesterday. Unclear of this is a MS relapse versus a due to the infection. I will consult neurology. -Continue baclofen, amantadine  6. Neuropathy-continue gabapentin.  7. Depression - cont. Cymbalta.     All the records are reviewed and case discussed with ED provider. Management plans discussed with the patient, family and they are in agreement.  CODE  STATUS: Full  TOTAL TIME TAKING CARE OF THIS PATIENT: 45 minutes.    Houston Siren M.D on 01/12/2016 at 3:31 PM  Between 7am to 6pm - Pager - [phone]  After 6pm go to [email or web address] - password [email or web address] [facility]  [facility]  Office  [phone]  CC: Primary care physician; Gladis Riffle, MD

## 2016-01-12 NOTE — ED Notes (Signed)
Difficulty obtaining lab specimens.  Unable to collect full blood culture bottles due to poor venous access.  Multiple attempts being made.

## 2016-01-12 NOTE — ED Notes (Signed)
MD made aware of need for IV access

## 2016-01-12 NOTE — ED Notes (Signed)
1 set of blood cultures compelted from previous nurse

## 2016-01-12 NOTE — ED Notes (Signed)
Katie, RN at bedside attempting ultrasound IV.

## 2016-01-12 NOTE — ED Notes (Signed)
Pt arrives via EMS for fever / chills since yesterday.  Pt is alert and oriented. Has Urostomy.  Pt has MS and does not ambulate.

## 2016-01-12 NOTE — ED Provider Notes (Signed)
[facility] Emergency Department Provider Note  ____________________________________________  Time seen: Seen upon arrival to the emergency department  I have reviewed the triage vital signs and the nursing notes.   HISTORY  Chief Complaint Fever   HPI [name] is a 45 y.o. female with a history of multiple sclerosis and multiple UTIs who is presenting to the emergency department today with right-sided flank pain and a fever that started last night. She denies any belly pain. Denies any cough or runny nose. Has had multiple UTIs in the past that have presented similarly but without the back pain. Says that she is at her baseline strength level with her MS which is paralysis of the bilateral lower extremities and weakness in the bilateral upper extremity is.   Past Medical History  Diagnosis Date  . MS (multiple sclerosis) (HCC) 2005  . Abdominal wall hernia 09/05/2014  . 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 Edema leg 09/05/2014  . 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 Neurogenic bowel 08/12/2012  . Neuropathic pain 05/21/2015    Patient Active Problem List   Diagnosis Date Noted  . Complicated UTI (urinary tract infection) 08/09/2015  . Exacerbation of multiple sclerosis (HCC) 08/09/2015  . 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 Multiple sclerosis (HCC)   . Neuropathic pain 05/21/2015  . 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 History of spinal surgery 04/29/2015  . Edema leg 09/05/2014  . Adiposity 09/05/2014  . Abdominal wall hernia 09/05/2014  . 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 Neurogenic bowel 08/12/2012  . Infection of urinary tract 04/07/2006  . 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 Revision urostomy cutaneous   06/2012  . Back surgery  2004  . Abdominal hysterectomy  2001    Current Outpatient Rx  Name  Route  Sig  Dispense  Refill  . amantadine (SYMMETREL) 100 MG capsule   Oral   Take 100 mg by mouth 2 (two) times daily.      5   . baclofen (LIORESAL) 20 MG tablet   Oral   Take 20 mg by mouth 3 (three) times daily.         . cephALEXin (KEFLEX) 500 MG capsule   Oral   Take 1 capsule (500 mg total) by mouth every 12 (twelve) hours.   14 capsule   0   . Cholecalciferol (VITAMIN D3) 50000 UNITS CAPS   Oral   Take 1 capsule by mouth once a week. Take on Monday.      0   . DULoxetine (CYMBALTA) 30 MG capsule   Oral   Take 30 mg by mouth 2 (two) times daily.         . Flaxseed, Linseed, (GNP FLAX SEED OIL) 1000 MG CAPS   Oral   Take 1,000 mg by mouth daily.         Marland Kitchen gabapentin (NEURONTIN) 600 MG tablet   Oral   Take 600 mg by mouth 3 (three) times daily.         Marland Kitchen HYDROcodone-acetaminophen (NORCO/VICODIN) 5-325 MG tablet   Oral   Take 1-2 tablets by mouth every 4 (four) hours as needed for moderate pain.   30 tablet   0   . ondansetron (ZOFRAN-ODT) 4 MG disintegrating tablet   Oral   Take  1 tablet (4 mg total) by mouth every 6 (six) hours as needed for nausea.   20 tablet   0   . polyethylene glycol (MIRALAX / GLYCOLAX) packet   Oral   Take 17 g by mouth daily.   14 each   0   . senna (SENOKOT) 8.6 MG TABS tablet   Oral   Take 2 tablets (17.2 mg total) by mouth at bedtime as needed for moderate constipation.   120 each   0     Allergies Latex  Family History  Problem Relation Age of Onset  . Osteoarthritis Mother   . Hypertension Mother   . Hyperlipidemia Mother   . Thyroid disease Mother   . Cancer Father   . Asthma Brother     Social History Social History  Substance Use Topics  . Smoking status: Never Smoker   . Smokeless tobacco: Not on file  . Alcohol Use: No    Review of Systems Constitutional: Fever Eyes: No visual  changes. ENT: No sore throat. Cardiovascular: Denies chest pain. Respiratory: Denies shortness of breath. Gastrointestinal: No abdominal pain.  No nausea, no vomiting.  No diarrhea.  No constipation. Genitourinary: Negative for dysuria. Musculoskeletal: Negative for back pain. Skin: Negative for rash. Neurological: Negative for headaches  10-point ROS otherwise negative.  ____________________________________________   PHYSICAL EXAM:  VITAL SIGNS: ED Triage Vitals  Enc Vitals Group     BP 01/12/16 1323 143/45 mmHg     Pulse Rate 01/12/16 1323 108     Resp 01/12/16 1323 26     Temp 01/12/16 1323 103 F (39.4 C)     Temp Source 01/12/16 1323 Oral     SpO2 01/12/16 1323 97 %     Weight 01/12/16 1349 205 lb (92.987 kg)     Height --      Head Cir --      Peak Flow --      Pain Score 01/12/16 1323 8     Pain Loc --      Pain Edu? --      Excl. in GC? --     Constitutional: Alert and oriented. Well appearing and in no acute distress. Eyes: Conjunctivae are normal. PERRL. EOMI. Head: Atraumatic. Nose: No congestion/rhinnorhea. Mouth/Throat: Mucous membranes are moist.   Neck: No stridor.   Cardiovascular: Normal rate, regular rhythm. Grossly normal heart sounds. Respiratory: Normal respiratory effort.  No retractions. Lungs CTAB. Gastrointestinal: Soft and nontender With a right lower quadrant urostomy with cloudy urine. No distention. CVA tenderness to the right side. Musculoskeletal: Bilateral lower extremities with moderate edema.  No joint effusions. Neurologic:  Normal speech and language.  Skin:  Skin is warm, dry and intact. No rash noted. Psychiatric: Mood and affect are normal. Speech and behavior are normal.  ____________________________________________   LABS (all labs ordered are listed, but only abnormal results are displayed)  Labs Reviewed  CBC WITH DIFFERENTIAL/PLATELET - Abnormal; Notable for the following:    WBC 17.7 (*)    All other components  within normal limits  URINALYSIS COMPLETEWITH MICROSCOPIC ([facility] ONLY) - Abnormal; Notable for the following:    Color, Urine YELLOW (*)    APPearance CLOUDY (*)    Ketones, ur 1+ (*)    Hgb urine dipstick 2+ (*)    Protein, ur 100 (*)    Nitrite POSITIVE (*)    Leukocytes, UA 2+ (*)    Bacteria, UA FEW (*)    Squamous Epithelial / LPF 0-5 (*)  All other components within normal limits  CULTURE, BLOOD (ROUTINE X 2)  CULTURE, BLOOD (ROUTINE X 2)  URINE CULTURE  COMPREHENSIVE METABOLIC PANEL  LACTIC ACID, PLASMA  LACTIC ACID, PLASMA   ____________________________________________  EKG   ____________________________________________  RADIOLOGY   ____________________________________________   PROCEDURES    ____________________________________________   INITIAL IMPRESSION / ASSESSMENT AND PLAN / ED COURSE  Pertinent labs & imaging results that were available during my care of the patient were reviewed by me and considered in my medical decision making (see chart for details).  Sepsis were called. Suspect urinary source.  ----------------------------------------- 2:56 PM on 01/12/2016 -----------------------------------------  Able to only obtain a 22 in the right foot. Attempted EJ's bilaterally recommend another attempted several hours. Patient will be admitted to the hospital. Spinous plan to the family as well as the patient on her understanding and willing to comply. Signed out to Dr. Cherlynn Kaiser. ____________________________________________   FINAL CLINICAL IMPRESSION(S) / ED DIAGNOSES  Pyelonephritis.    Myrna Blazer, MD 01/12/16 [phone]

## 2016-01-12 NOTE — Progress Notes (Signed)
Pharmacy Antibiotic Note  [name] is a 45 y.o. female admitted on 01/12/2016 with UTI.  Pharmacy has been consulted for Cefepime dosing.  Plan: Will start Cefepime 2 g IV q12 hours.   Weight: 205 lb (92.987 kg)  Temp (24hrs), Avg:103 F (39.4 C), Min:103 F (39.4 C), Max:103 F (39.4 C)   Recent Labs Lab 01/12/16 1343  WBC 17.7*  CREATININE 0.52  LATICACIDVEN 0.7    Estimated Creatinine Clearance: 97.1 mL/min (by C-G formula based on Cr of 0.52).    Allergies  Allergen Reactions  . Latex Swelling and Other (See Comments)    Reaction:  Facial swelling     Antimicrobials this admission: Cefepime 4/18   >>    >>   Dose adjustments this admission:   Microbiology results:  BCx:   UCx:   Sputum:    MRSA PCR:   Thank you for allowing pharmacy to be a part of this patient's care.  , D 01/12/2016 3:13 PM

## 2016-01-12 NOTE — ED Notes (Signed)
Dr. Pershing Proud MD attempted EJ x 2. Unsuccessful.

## 2016-01-13 LAB — BLOOD CULTURE ID PANEL (REFLEXED)
Acinetobacter baumannii: NOT DETECTED
CANDIDA GLABRATA: NOT DETECTED
CANDIDA KRUSEI: NOT DETECTED
CANDIDA TROPICALIS: NOT DETECTED
Candida albicans: NOT DETECTED
Candida parapsilosis: NOT DETECTED
Carbapenem resistance: NOT DETECTED
ESCHERICHIA COLI: NOT DETECTED
Enterobacter cloacae complex: NOT DETECTED
Enterobacteriaceae species: NOT DETECTED
Enterococcus species: NOT DETECTED
Haemophilus influenzae: NOT DETECTED
KLEBSIELLA OXYTOCA: NOT DETECTED
Klebsiella pneumoniae: NOT DETECTED
LISTERIA MONOCYTOGENES: NOT DETECTED
Methicillin resistance: NOT DETECTED
NEISSERIA MENINGITIDIS: NOT DETECTED
PROTEUS SPECIES: NOT DETECTED
Pseudomonas aeruginosa: NOT DETECTED
SERRATIA MARCESCENS: NOT DETECTED
STREPTOCOCCUS PNEUMONIAE: NOT DETECTED
STREPTOCOCCUS SPECIES: NOT DETECTED
Staphylococcus aureus (BCID): NOT DETECTED
Staphylococcus species: DETECTED — AB
Streptococcus agalactiae: NOT DETECTED
Streptococcus pyogenes: NOT DETECTED
Vancomycin resistance: NOT DETECTED

## 2016-01-13 LAB — CBC
HEMATOCRIT: 33.1 % — AB (ref 35.0–47.0)
Hemoglobin: 11.3 g/dL — ABNORMAL LOW (ref 12.0–16.0)
MCH: 30.3 pg (ref 26.0–34.0)
MCHC: 34.1 g/dL (ref 32.0–36.0)
MCV: 88.9 fL (ref 80.0–100.0)
Platelets: 194 10*3/uL (ref 150–440)
RBC: 3.73 MIL/uL — ABNORMAL LOW (ref 3.80–5.20)
RDW: 13.8 % (ref 11.5–14.5)
WBC: 10.3 10*3/uL (ref 3.6–11.0)

## 2016-01-13 LAB — BASIC METABOLIC PANEL
Anion gap: 6 (ref 5–15)
BUN: 5 mg/dL — AB (ref 6–20)
CHLORIDE: 107 mmol/L (ref 101–111)
CO2: 23 mmol/L (ref 22–32)
Calcium: 8.1 mg/dL — ABNORMAL LOW (ref 8.9–10.3)
Creatinine, Ser: 0.47 mg/dL (ref 0.44–1.00)
GFR calc Af Amer: >60 mL/min (ref >60)
GFR calc non Af Amer: >60 mL/min (ref >60)
GLUCOSE: 104 mg/dL — AB (ref 65–99)
POTASSIUM: 3.1 mmol/L — AB (ref 3.5–5.1)
Sodium: 136 mmol/L (ref 135–145)

## 2016-01-13 LAB — URINE CULTURE

## 2016-01-13 MED ORDER — SODIUM CHLORIDE 0.9 % IV SOLN
INTRAVENOUS | Status: AC
  Administered 2016-01-13: 22:00:00 via INTRAVENOUS

## 2016-01-13 MED ORDER — HYDROCODONE-ACETAMINOPHEN 5-325 MG PO TABS
1.0000 | ORAL_TABLET | ORAL | Status: DC | PRN
  Administered 2016-01-13 – 2016-01-14 (×2): 1 via ORAL
  Filled 2016-01-13 (×2): qty 1

## 2016-01-13 MED ORDER — POTASSIUM CHLORIDE CRYS ER 20 MEQ PO TBCR
40.0000 meq | EXTENDED_RELEASE_TABLET | ORAL | Status: AC
  Administered 2016-01-13 (×2): 40 meq via ORAL
  Filled 2016-01-13 (×2): qty 2

## 2016-01-13 NOTE — Progress Notes (Signed)
Notified Dr. Anne Hahn of IVF expiration; low-grade fever, K+ and supplements and positive BC; VSS and receiving Antibx; acknowledged; Windy Carina, RN; 11:15 PM; 01/13/2016

## 2016-01-13 NOTE — Progress Notes (Signed)
[facility] Physicians - [place] at [facility]   PATIENT NAME: [name]    MR#:  [id number]  DATE OF BIRTH:  08-03-1971  SUBJECTIVE:  CHIEF COMPLAINT:   Chief Complaint  Patient presents with  . Fever   Afebrile now. Complains of mild pain around cystostomy site. At around baseline.  REVIEW OF SYSTEMS:    Review of Systems  Constitutional: Positive for fever and malaise/fatigue. Negative for chills.  HENT: Negative for sore throat.   Eyes: Negative for blurred vision, double vision and pain.  Respiratory: Negative for cough, hemoptysis, shortness of breath and wheezing.   Cardiovascular: Negative for chest pain, palpitations, orthopnea and leg swelling.  Gastrointestinal: Positive for abdominal pain. Negative for heartburn, nausea, vomiting, diarrhea and constipation.  Genitourinary: Negative for dysuria and hematuria.  Musculoskeletal: Negative for back pain and joint pain.  Skin: Negative for rash.  Neurological: Positive for weakness. Negative for sensory change, speech change, focal weakness and headaches.  Endo/Heme/Allergies: Does not bruise/bleed easily.  Psychiatric/Behavioral: Negative for depression. The patient is not nervous/anxious.     DRUG ALLERGIES:   Allergies  Allergen Reactions  . Latex Swelling and Other (See Comments)    Reaction:  Facial swelling     VITALS:  Blood pressure 115/69, pulse 104, temperature 98.9 F (37.2 C), temperature source Oral, resp. rate 20, height 5\' 4"  (1.626 m), weight 93.169 kg (205 lb 6.4 oz), SpO2 99 %.  PHYSICAL EXAMINATION:   Physical Exam  GENERAL:  45 y.o.-year-old patient lying in the bed with no acute distress.  EYES: Pupils equal, round, reactive to light and accommodation. No scleral icterus. Extraocular muscles intact.  HEENT: Head atraumatic, normocephalic. Oropharynx and nasopharynx clear.  NECK:  Supple, no jugular venous distention. No thyroid enlargement, no tenderness.  LUNGS: Normal  breath sounds bilaterally, no wheezing, rales, rhonchi. No use of accessory muscles of respiration.  CARDIOVASCULAR: S1, S2 normal. No murmurs, rubs, or gallops.  ABDOMEN: Soft, nondistended. Bowel sounds present. No organomegaly or mass. Cystostomy bag.  EXTREMITIES: No cyanosis, clubbing or edema b/l.    NEUROLOGIC: Motor strength in upper extremities 5 over 5. Sensations intact. PSYCHIATRIC: The patient is alert and oriented x 3.  SKIN: No obvious rash, lesion, or ulcer.   LABORATORY PANEL:   CBC  Recent Labs Lab 01/13/16 0421  WBC 10.3  HGB 11.3*  HCT 33.1*  PLT 194   ------------------------------------------------------------------------------------------------------------------ Chemistries   Recent Labs Lab 01/12/16 1343 01/13/16 0421  NA 135 136  K 3.2* 3.1*  CL 100* 107  CO2 23 23  GLUCOSE 102* 104*  BUN 8 5*  CREATININE 0.52 0.47  CALCIUM 8.6* 8.1*  AST 34  --   ALT 27  --   ALKPHOS 55  --   BILITOT 1.1  --    ------------------------------------------------------------------------------------------------------------------  Cardiac Enzymes No results for input(s): TROPONINI in the last 168 hours. ------------------------------------------------------------------------------------------------------------------  RADIOLOGY:  No results found.   ASSESSMENT AND PLAN:   45 year old female with past medical history of progressive MS, neurogenic bladder status post urostomy, neuropathy, who presented to the hospital due to fever, tachycardia noted to be septic.  * UTI with sepsis- present on admission Continue IV ceftriaxone. At and urine cultures are pending. Sepsis resolved. Now afebrile Likely discharge tomorrow on oral antibiotics  * Hypokalemia On oral supplements  * Multiple sclerosis  Patient had left upper extremity weakness which is almost resolved. Likely due to the acute infection. No change in medications.  * Neuropathy-continue  gabapentin.  * Depression - cont. Cymbalta.   All the records are reviewed and case discussed with Care Management/Social Workerr. Management plans discussed with the patient, family and they are in agreement.  CODE STATUS: FULL  DVT Prophylaxis: SCDs  TOTAL TIME TAKING CARE OF THIS PATIENT: 35 minutes.   POSSIBLE D/C IN 2-3 DAYS, DEPENDING ON CLINICAL CONDITION.  Milagros Loll R M.D on 01/13/2016 at 12:14 PM  Between 7am to 6pm - Pager - [phone]  After 6pm go to [email or web address] - password [email or web address]  [facility]  Office  [phone]  [id number] care physician; Gladis Riffle, MD  Note: This dictation was prepared with Dragon dictation along with smaller phrase technology. Any transcriptional errors that result from this process are unintentional.

## 2016-01-13 NOTE — Care Management (Signed)
Patient admitted from home with sepsis.  Patient lives at home with her mother.  History of MS.  Chronic urostomy.  Patient states that she was discharged from [facility] on 11/10/15.  Patient open with home health through [facility].  Open with PT, OT, aide, and RN.  Patient states that mobility she is at her baseline.  Patient states that she has a wheelchair, lift chair, and hospital bed in the home.  Patient obtains her medication from [facility] and mail scripts.  Will nee resumption of care orders at time of discharge.

## 2016-01-14 NOTE — Progress Notes (Signed)
[facility] Physicians - [place] at [facility]   PATIENT NAME: [name]    MR#:  [id number]  DATE OF BIRTH:  [birth year]  SUBJECTIVE:  CHIEF COMPLAINT:   Chief Complaint  Patient presents with  . Fever   Tmax of 100.5. Pain around the urostomy site is much improved. Blood cultures 1 out of 2 positive for Staphylococcus.  REVIEW OF SYSTEMS:    Review of Systems  Constitutional: Positive for fever and malaise/fatigue. Negative for chills.  HENT: Negative for sore throat.   Eyes: Negative for blurred vision, double vision and pain.  Respiratory: Negative for cough, hemoptysis, shortness of breath and wheezing.   Cardiovascular: Negative for chest pain, palpitations, orthopnea and leg swelling.  Gastrointestinal: Positive for abdominal pain. Negative for heartburn, nausea, vomiting, diarrhea and constipation.  Genitourinary: Negative for dysuria and hematuria.  Musculoskeletal: Negative for back pain and joint pain.  Skin: Negative for rash.  Neurological: Positive for weakness. Negative for sensory change, speech change, focal weakness and headaches.  Endo/Heme/Allergies: Does not bruise/bleed easily.  Psychiatric/Behavioral: Negative for depression. The patient is not nervous/anxious.     DRUG ALLERGIES:   Allergies  Allergen Reactions  . Latex Swelling and Other (See Comments)    Reaction:  Facial swelling     VITALS:  Blood pressure 130/59, pulse 98, temperature 99.5 F (37.5 C), temperature source Oral, resp. rate 16, height 5\' 4"  (1.626 m), weight 93.169 kg (205 lb 6.4 oz), SpO2 96 %.  PHYSICAL EXAMINATION:   Physical Exam  GENERAL:  45 y.o.-year-old patient lying in the bed with no acute distress.  EYES: Pupils equal, round, reactive to light and accommodation. No scleral icterus. Extraocular muscles intact.  HEENT: Head atraumatic, normocephalic. Oropharynx and nasopharynx clear.  NECK:  Supple, no jugular venous distention. No thyroid  enlargement, no tenderness.  LUNGS: Normal breath sounds bilaterally, no wheezing, rales, rhonchi. No use of accessory muscles of respiration.  CARDIOVASCULAR: S1, S2 normal. No murmurs, rubs, or gallops.  ABDOMEN: Soft, nondistended. Bowel sounds present. No organomegaly or mass. Urostomy bag.  EXTREMITIES: No cyanosis, clubbing or edema b/l.    NEUROLOGIC: Motor strength in upper extremities 5 over 5. Sensations intact. PSYCHIATRIC: The patient is alert and oriented x 3.  SKIN: No obvious rash, lesion, or ulcer.   LABORATORY PANEL:   CBC  Recent Labs Lab 01/13/16 0421  WBC 10.3  HGB 11.3*  HCT 33.1*  PLT 194   ------------------------------------------------------------------------------------------------------------------ Chemistries   Recent Labs Lab 01/12/16 1343 01/13/16 0421  NA 135 136  K 3.2* 3.1*  CL 100* 107  CO2 23 23  GLUCOSE 102* 104*  BUN 8 5*  CREATININE 0.52 0.47  CALCIUM 8.6* 8.1*  AST 34  --   ALT 27  --   ALKPHOS 55  --   BILITOT 1.1  --    ------------------------------------------------------------------------------------------------------------------  Cardiac Enzymes No results for input(s): TROPONINI in the last 168 hours. ------------------------------------------------------------------------------------------------------------------  RADIOLOGY:  No results found.   ASSESSMENT AND PLAN:   45 year old female with past medical history of progressive MS, neurogenic bladder status post urostomy, neuropathy, who presented to the hospital due to fever, tachycardia noted to be septic.  * UTI with sepsis- present on admission Continue IV ceftriaxone. urine cultures are pending. Sepsis resolved.   * Onset of blood cultures with coag-negative staph 1 out of 2 bottles is likely contaminant.  * Hypokalemia On oral supplements  * Multiple sclerosis  Patient had left upper extremity weakness which is  almost resolved. Likely due to the  acute infection. No change in medications.  * Neuropathy-continue gabapentin.  * Depression - cont. Cymbalta.   All the records are reviewed and case discussed with Care Management/Social Workerr. Management plans discussed with the patient, family and they are in agreement.  CODE STATUS: FULL  DVT Prophylaxis: SCDs  TOTAL TIME TAKING CARE OF THIS PATIENT: 35 minutes.   POSSIBLE D/C TOMORROW, DEPENDING ON CLINICAL CONDITION.  Milagros Loll R M.D on 01/14/2016 at 10:55 AM  Between 7am to 6pm - Pager - [phone]  After 6pm go to [email or web address] - password [email or web address] [facility]  [facility]  Office  [phone]  [id number] care physician; Gladis Riffle, MD  Note: This dictation was prepared with Dragon dictation along with smaller phrase technology. Any transcriptional errors that result from this process are unintentional.

## 2016-01-14 NOTE — Clinical Documentation Improvement (Signed)
Internal Medicine  A cause and effect relationship may not be assumed and must be documented by a provider.  Please clarify the relationship, if any, between UTI and Urostomy.  Are the conditions:   Due to or associated with each other  Unrelated to each other  Other  Clinically Undetermined   Supporting Information (risk factors, sign and symptoms, diagnostics, treatment):  45 year old female with history MS, recurrent UTI's, Lower extremity paralysis, and a cystotomy  Admitted with UTI with sepsis-present on admission  ED Provider note history of multiple sclerosis and multiple UTIs who is presenting to the emergency department today with right-sided flank pain and a fever that started last night. She denies any belly pain. Denies any cough or runny nose. Has had multiple UTIs in the past that have presented similarly but without the back pain. Gastrointestinal: Soft and nontender With a right lower quadrant urostomy with cloudy urine. No distention. CVA tenderness to the right side.  H&P Patient had a fever 103 here in the ER and was also noted to have a urinary tract infection. Patient was noted to be tachycardic and meets criteria for sepsis due to UTI and therefore hospitalist services were contacted further treatment and evaluation. Patient denies any chest pain, shortness of breath, nausea, vomiting, abdominal pain, diarrhea or any other associated symptoms presently. She does have a urostomy given her neurogenic bladder.  1. Sepsis-patient meets criteria given the tachycardia, fever, positive urinalysis. -I will treat the patient with IV ceftriaxone, follow urine, blood cultures. Follow hemodynamics.  2. Urinary tract infection-this is a cause of patient's sepsis. -Patient previously has a UTI due to Klebsiella, Escherichia coli. There were both sensitive to ceftriaxone which I will start the patient on. Follow urine cultures.  3. Leukocytosis-secondary to the  sepsis/UTI. Follow with IV antibiotic therapy.  4/19 progress notes 45 year old female with past medical history of progressive MS, neurogenic bladder status post urostomy, neuropathy, who presented to the hospital due to fever, tachycardia noted to be septic.  * UTI with sepsis- present on admission Continue IV ceftriaxone. At and urine cultures are pending. Sepsis resolved. Now afebrile Likely discharge tomorrow on oral antibiotics  Treatments above in notes.  Please exercise your independent, professional judgment when responding. A specific answer is not anticipated or expected.   Thank You,  Harless Litten Health Information Management [place] [phone]

## 2016-01-14 NOTE — Care Management Important Message (Signed)
Important Message  Patient Details  Name: [name] MRN: [id number] Date of Birth: [birth year]   Medicare Important Message Given:  Yes    Olegario Messier A  01/14/2016, 10:50 AM

## 2016-01-15 MED ORDER — CEPHALEXIN 500 MG PO CAPS: 500.0000 mg | ORAL_CAPSULE | Freq: Three times a day (TID) | ORAL | Status: DC

## 2016-01-15 NOTE — Progress Notes (Signed)
Patient left via stretcher escorted by EMS. 

## 2016-01-15 NOTE — Care Management (Signed)
Patient to be discharged home today.  Resumption of care orders have been placed for PT, RN, aide, and OT.  I have notified [facility] of Discharge plan.  RNCM signing off

## 2016-01-15 NOTE — Care Management (Signed)
Patient requested to be transported home via EMS, due to weakness in legs and not being able to pivot to WC.  I informed patient that transport could potentially be denied by insurance.  Patient states that she understands and would like to proceed.

## 2016-01-15 NOTE — Discharge Instructions (Signed)

## 2016-01-15 NOTE — Discharge Summary (Signed)
[facility] Physicians - [place] at [facility]   PATIENT NAME: [name]    MR#:  [id number]  DATE OF BIRTH:  28-Mar-1971  DATE OF ADMISSION:  01/12/2016 ADMITTING PHYSICIAN: Houston Siren, MD  DATE OF DISCHARGE: No discharge date for patient encounter.  PRIMARY CARE PHYSICIAN: Gladis Riffle, MD   ADMISSION DIAGNOSIS:  Pyelonephritis [N12] Sepsis, due to unspecified organism [facility]) [A41.9]  DISCHARGE DIAGNOSIS:  Active Problems:   Sepsis (HCC)   SECONDARY DIAGNOSIS:   Past Medical History  Diagnosis Date  . MS (multiple sclerosis) (HCC) 2005  . Abdominal wall hernia 09/05/2014  . 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 Edema leg 09/05/2014  . 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 Neurogenic bowel 08/12/2012  . Neuropathic pain 05/21/2015     ADMITTING HISTORY  [name] is a 45 y.o. female with a known history of Progressive MS, bladder neurogenesis status post urostomy, neurogenic bowel, neuropathic pain, abdominal wall hernia presents to the hospital due to fever, chills. Patient says she developed a fever of 103 yesterday and took some Tylenol and came down but it came back up again early this morning. She called EMS and they brought her to the ER for further evaluation. Patient had a fever 103 here in the ER and was also noted to have a urinary tract infection. Patient was noted to be tachycardic and meets criteria for sepsis due to UTI and therefore hospitalist services were contacted further treatment and evaluation. Patient denies any chest pain, shortness of breath, nausea, vomiting, abdominal pain, diarrhea or any other associated symptoms presently. She does have a urostomy given her neurogenic bladder. She also said that she had some left arm weakness yesterday for fever but that has improved now.  HOSPITAL COURSE:   45 year old female with past medical  history of progressive MS, neurogenic bladder status post urostomy, neuropathy, who presented to the hospital due to fever, tachycardia noted to be septic.  * UTI with sepsis- present on admission Treated with IV ceftriaxone in the hospital. Urine cultures have been negative. Sepsis resolved with fluid resuscitation. Patient is being changed to Keflex for 5 more days after discharge. Urology follow-up after discharge.  * Hypokalemia On oral supplements  * Multiple sclerosis  Patient had left upper extremity weakness which is almost resolved. Likely due to the acute infection. No change in medications.  * Neuropathy-continue gabapentin.  * Depression - cont. Cymbalta.   One out of 2 bottles of blood cultures were positive for staph species which was likely contaminant.  Stable for discharge home  CONSULTS OBTAINED:     DRUG ALLERGIES:   Allergies  Allergen Reactions  . Latex Swelling and Other (See Comments)    Reaction:  Facial swelling     DISCHARGE MEDICATIONS:   Current Discharge Medication List    START taking these medications   Details  cephALEXin (KEFLEX) 500 MG capsule Take 1 capsule (500 mg total) by mouth 3 (three) times daily. Qty: 15 capsule, Refills: 0      CONTINUE these medications which have NOT CHANGED   Details  amantadine (SYMMETREL) 100 MG capsule Take 100 mg by mouth 2 (two) times daily. Refills: 5    baclofen (LIORESAL) 20 MG tablet Take 20 mg by mouth 3 (three) times daily.    DULoxetine (CYMBALTA) 30 MG capsule Take 30 mg by mouth 2 (two) times daily.    gabapentin (NEURONTIN)  600 MG tablet Take 600 mg by mouth 4 (four) times daily.     Vitamin D, Ergocalciferol, (DRISDOL) 50000 units CAPS capsule Take 50,000 Units by mouth every 7 (seven) days. Pt takes on Monday.        Today   VITAL SIGNS:  Blood pressure 119/44, pulse 81, temperature 98.1 F (36.7 C), temperature source Oral, resp. rate 17, height  (1.626 m), weight  93.169 kg (205 lb 6.4 oz), SpO2 100 %.  I/O:   Intake/Output Summary (Last 24 hours) at 01/15/16 1431 Last data filed at 01/15/16 1130  Gross per 24 hour  Intake   1080 ml  Output   2275 ml  Net  -1195 ml    PHYSICAL EXAMINATION:  Physical Exam  GENERAL:  45 y.o.-year-old patient lying in the bed with no acute distress.  LUNGS: Normal breath sounds bilaterally, no wheezing, rales,rhonchi or crepitation. No use of accessory muscles of respiration.  CARDIOVASCULAR: S1, S2 normal. No murmurs, rubs, or gallops.  ABDOMEN: Soft, non-tender, non-distended. Bowel sounds present. No organomegaly or mass. Cystostomy bag NEUROLOGIC: Motor strength 5/5 upper extremities PSYCHIATRIC: The patient is alert and oriented x 3.  SKIN: No obvious rash, lesion, or ulcer.   DATA REVIEW:   CBC  Recent Labs Lab 01/13/16 0421  WBC 10.3  HGB 11.3*  HCT 33.1*  PLT 194    Chemistries   Recent Labs Lab 01/12/16 1343 01/13/16 0421  NA 135 136  K 3.2* 3.1*  CL 100* 107  CO2 23 23  GLUCOSE 102* 104*  BUN 8 5*  CREATININE 0.52 0.47  CALCIUM 8.6* 8.1*  AST 34  --   ALT 27  --   ALKPHOS 55  --   BILITOT 1.1  --     Cardiac Enzymes No results for input(s): TROPONINI in the last 168 hours.  Microbiology Results  Results for orders placed or performed during the hospital encounter of 01/12/16  Blood Culture (routine x 2)     Status: None (Preliminary result)   Collection Time: 01/12/16  1:43 PM  Result Value Ref Range Status   Specimen Description BLOOD LEFT SHOULDER  Final   Special Requests   Final    BOTTLES DRAWN AEROBIC AND ANAEROBIC 7CC AERO 5CC ANA   Culture  Setup Time   Final    GRAM POSITIVE COCCI ANAEROBIC BOTTLE ONLY CRITICAL RESULT CALLED TO, READ BACK BY AND VERIFIED WITH: WENDY LANGLEY 01/13/16 1235 MLM    Culture   Final    COAGULASE NEGATIVE STAPHYLOCOCCUS ANAEROBIC BOTTLE ONLY THE SIGNIFICANCE OF ISOLATING THIS ORGANISM FROM A SINGLE VENIPUNCTURE CANNOT BE  PREDICTED WITHOUT FURTHER CLINICAL AND CULTURE CORRELATION. SUSCEPTIBILITIES AVAILABLE ONLY ON REQUEST.    Report Status PENDING  Incomplete  Urine culture     Status: None   Collection Time: 01/12/16  1:43 PM  Result Value Ref Range Status   Specimen Description URINE, RANDOM  Final   Special Requests NONE  Final   Culture   Final    MULTIPLE SPECIES PRESENT, SUGGEST RECOLLECTION Probably contamination with fecal flora.    Report Status 01/13/2016 FINAL  Final  Blood Culture ID Panel (Reflexed)     Status: Abnormal   Collection Time: 01/12/16  1:43 PM  Result Value Ref Range Status   Enterococcus species NOT DETECTED NOT DETECTED Final   Vancomycin resistance NOT DETECTED NOT DETECTED Final   Listeria monocytogenes NOT DETECTED NOT DETECTED Final   Staphylococcus species DETECTED (A) NOT DETECTED Final  Comment: CRITICAL RESULT CALLED TO, READ BACK BY AND VERIFIED WITH: WENDY LANGLEY 01/13/16 1235 MLM    Staphylococcus aureus NOT DETECTED NOT DETECTED Final   Methicillin resistance NOT DETECTED NOT DETECTED Final   Streptococcus species NOT DETECTED NOT DETECTED Final   Streptococcus agalactiae NOT DETECTED NOT DETECTED Final   Streptococcus pneumoniae NOT DETECTED NOT DETECTED Final   Streptococcus pyogenes NOT DETECTED NOT DETECTED Final   Acinetobacter baumannii NOT DETECTED NOT DETECTED Final   Enterobacteriaceae species NOT DETECTED NOT DETECTED Final   Enterobacter cloacae complex NOT DETECTED NOT DETECTED Final   Escherichia coli NOT DETECTED NOT DETECTED Final   Klebsiella oxytoca NOT DETECTED NOT DETECTED Final   Klebsiella pneumoniae NOT DETECTED NOT DETECTED Final   Proteus species NOT DETECTED NOT DETECTED Final   Serratia marcescens NOT DETECTED NOT DETECTED Final   Carbapenem resistance NOT DETECTED NOT DETECTED Final   Haemophilus influenzae NOT DETECTED NOT DETECTED Final   Neisseria meningitidis NOT DETECTED NOT DETECTED Final   Pseudomonas aeruginosa NOT  DETECTED NOT DETECTED Final   Candida albicans NOT DETECTED NOT DETECTED Final   Candida glabrata NOT DETECTED NOT DETECTED Final   Candida krusei NOT DETECTED NOT DETECTED Final   Candida parapsilosis NOT DETECTED NOT DETECTED Final   Candida tropicalis NOT DETECTED NOT DETECTED Final    RADIOLOGY:  No results found.  Follow up with PCP in 1 week.  Management plans discussed with the patient, family and they are in agreement.  CODE STATUS:     Code Status Orders        Start     Ordered   01/12/16 1649  Full code   Continuous     01/12/16 1648    Code Status History    Date Active Date Inactive Code Status Order ID Comments User Context   08/09/2015  5:24 PM 08/12/2015  9:35 PM Full Code [id number]  Marguarite Arbour, MD Inpatient   07/19/2015  2:54 PM 07/25/2015  1:51 PM Full Code [id number]  Gladis Riffle, MD Inpatient      TOTAL TIME TAKING CARE OF THIS PATIENT ON DAY OF DISCHARGE: more than 30 minutes.   Milagros Loll R M.D on 01/15/2016 at 2:31 PM  Between 7am to 6pm - Pager - [phone]  After 6pm go to [email or web address] - password [email or web address] [facility]  [facility]  Office  [phone]  CC: Primary care physician; Gladis Riffle, MD  Note: This dictation was prepared with Dragon dictation along with smaller phrase technology. Any transcriptional errors that result from this process are unintentional.

## 2016-01-15 NOTE — Progress Notes (Signed)
MD ordered patient to be discharged home.  Discharge instructions were reviewed with the patient and she voiced understanding.  Prescription given to the patient.  Patient instructed about making her own follow-up appointment.  IV was removed with catheter intact.  All patient's questions were answered.  Patient going to be transported by EMS, just waiting for them to come get her.

## 2016-01-17 LAB — CULTURE, BLOOD (ROUTINE X 2)

## 2016-03-19 ENCOUNTER — Other Ambulatory Visit
Admission: RE | Admit: 2016-03-19 | Discharge: 2016-03-19 | Disposition: A | Discharge status: Home / Self Care | Payer: Medicare Other | Source: Ambulatory Visit | Attending: Internal Medicine | Admitting: Internal Medicine

## 2016-03-19 DIAGNOSIS — Z87440 Personal history of urinary (tract) infections: Secondary | ICD-10-CM | POA: Insufficient documentation

## 2016-03-19 LAB — URINALYSIS COMPLETE WITH MICROSCOPIC (ARMC ONLY)
BILIRUBIN URINE: NEGATIVE
GLUCOSE, UA: NEGATIVE mg/dL
KETONES UR: NEGATIVE mg/dL
NITRITE: NEGATIVE
PH: 7 (ref 5.0–8.0)
Protein, ur: NEGATIVE mg/dL
Specific Gravity, Urine: 1.008 (ref 1.005–1.030)
Squamous Epithelial / LPF: NONE SEEN

## 2016-03-22 LAB — URINE CULTURE: Culture: >=100000 — AB

## 2016-04-06 ENCOUNTER — Ambulatory Visit: Payer: Medicare Other | Attending: Internal Medicine | Admitting: Physical Therapy

## 2016-04-06 ENCOUNTER — Encounter: Payer: Self-pay | Admitting: Physical Therapy

## 2016-04-06 DIAGNOSIS — M6281 Muscle weakness (generalized): Secondary | ICD-10-CM | POA: Diagnosis present

## 2016-04-06 DIAGNOSIS — R2681 Unsteadiness on feet: Secondary | ICD-10-CM | POA: Diagnosis present

## 2016-04-06 DIAGNOSIS — R279 Unspecified lack of coordination: Secondary | ICD-10-CM

## 2016-04-06 NOTE — Therapy (Signed)
[facility] MAIN [facility] SERVICES [address] [place], [place], [address] Phone: [phone]   Fax:  [phone]  Physical Therapy Evaluation  Patient Details  Name: [name] MRN: [id number] Date of Birth: 1971-05-04 Referring Provider: Dr Leone Brand (neurologist), Dr. Marcello Fennel (referring provide)  Encounter Date: 04/06/2016      PT End of Session - 04/06/16 1554    Visit Number 1   Number of Visits 17   Date for PT Re-Evaluation 06/01/16   Authorization Type G code 1   Authorization Time Period 10   PT Start Time 1354   PT Stop Time 1446   PT Time Calculation (min) 52 min   Equipment Utilized During Treatment Other (comment)   Activity Tolerance Patient tolerated treatment well   Behavior During Therapy [facility] for tasks assessed/performed      Past Medical History  Diagnosis Date  . MS (multiple sclerosis) (HCC) 2005  . Abdominal wall hernia 09/05/2014  . 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 Edema leg 09/05/2014  . 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 Neurogenic bowel 08/12/2012  . Neuropathic pain 05/21/2015    Past Surgical History  Procedure Laterality Date  . Revision urostomy cutaneous  06/2012  . Back surgery  2004  . Abdominal hysterectomy  2001    There were no vitals filed for this visit.       Subjective Assessment - 04/06/16 1356    Subjective Patient is a 45 y.o. female referred to PT for secondary complications of MS to address weakness and decreased function. Patient noted that she was diagnosed with primary progressive MS back in 2005. With progression, patient is no longer able to stand or walk (since 2015) and requires assistance to perform ADLs. Previous to 2015, patient was able to walk, drive with an adaptive device, and perform most ADLs. Patient reported that in  2013 she had her bladder removed and now has a  urostomy bag. Patient was receiving HHPT but recently stopped due to coming to receive outpatient care. Patient notes that she get assistance with transfers through a [place] lift from her family or friends. Patient states her last MRI was a year ago with another one scheduled in a few months.   Pertinent History Pertinent Hx for Therapy: progressive diagnosis, older mother is primary care giver, limited transportation    Limitations Sitting;Standing;Walking   How long can you sit comfortably? few hours    How long can you stand comfortably? unable   How long can you walk comfortably? unable   Diagnostic tests MRI in next couple of months   Patient Stated Goals stand walk, get in and out    Currently in Pain? Yes   Pain Score 5    Pain Location Knee   Pain Orientation Right;Left   Pain Descriptors / Indicators Stabbing;Burning   Pain Type Chronic pain   Pain Frequency Intermittent   Pain Relieving Factors meds    Effect of Pain on Daily Activities heat and cold            [facility] PT Assessment - 04/06/16 0001    Assessment   Medical Diagnosis MS   Referring Provider Dr Leone Brand (neurologist), Dr. Marcello Fennel (referring provide)   Onset Date/Surgical Date 03/26/14  onset of most recent decline   Hand Dominance Right   Next MD Visit July    Prior Therapy HHPT previous to outpatient therapy, helping  some but needs more intensive therapy   Precautions   Precautions None   Restrictions   Weight Bearing Restrictions Yes   Balance Screen   Has the patient fallen in the past 6 months No   Has the patient had a decrease in activity level because of a fear of falling?  No   Is the patient reluctant to leave their home because of a fear of falling?  No   Home Nurse, mental health Private residence   Living Arrangements Parent   Available Help at Discharge Family;Friend(s)   Type of Home House   Home Access Ramped entrance   Home Layout One level   Home Equipment Hospital  bed;Wheelchair - power;Wheelchair - manual   Additional Comments transfer stander, adapted car, trapeze over bed   Prior Function   Level of Independence Needs assistance with transfers;Needs assistance with ADLs   Vocation On disability   Leisure Working, things that are [employer]   Overall Cognitive Status Within Functional Limits for tasks assessed   Attention Focused   Observation/Other Assessments   Observations Patient appears to have swelling in her BLE, knees and below   Skin Integrity Feet/heels have no skin breakdown   Sensation   Light Touch Impaired Detail   Light Touch Impaired Details Impaired RLE;Impaired LLE   Additional Comments Patient has spotted sensitivity to light touch in BLE with inability to feel anterior light touch to skin just above sock line, sensitive to light touch at ankle crease, no sensation on dorsum of foot. No light touch, deep pressure only. L requires less pressure to note feeling    Coordination   Gross Motor Movements are Fluid and Coordinated No   Fine Motor Movements are Fluid and Coordinated No   Finger Nose Finger Test Patient has mild coordination impairment with greater difficulty on L>R.    Posture/Postural Control   Posture Comments In wheelchair, patient sits with back supported in slumped position. On edge of plynth, initially needs assistance to maintain seated posture, posture improves with ability to use BUE. Posture continues to be slumped with rounded shoulders.   AROM   Overall AROM Comments BUE, cervical, and B ankle AROM WFL, unable to perform full range knee extension, no hip AROM in supine/seated    Strength   Overall Strength Comments Gross BUE strength 4-/5    Strength Assessment Site Hip;Knee;Ankle   Right Hip Flexion 0/5   Right Hip Extension 1/5   Left Hip Flexion 0/5   Left Hip Extension 2-/5   Right Knee Extension 2-/5   Left Knee Extension 2-/5   Right Ankle Dorsiflexion 3/5   Left Ankle Dorsiflexion 3+/5    Transfers   Comments [place] Lift   Ambulation/Gait   Gait Comments N/A   High Level Balance   High Level Balance Comments Seated balance, no back support: able to move BUE below 90 degrees and maintain stability, increase BUE above 90 degrees patient requires min A to maintain balance. If patient begins to lose balance, she is unable to return to an upright posture independently. With long time sitting and no back support, patient uses BUE for support.      Treatment:  HEP initiated  Long sitting hamstring stretch, 2x hold x 10-15 seconds. Min VCs to increase stretch by pulling ankle into DF. Reports she feels stretch with mild pain with ankle DF, instructed to not perform in a painful range  Supine Quad sets, x5 each LE, difficulty  performing with min verbal/tactile cueing to pretend she is "lifting her foot off table" (unable to do but VC improves quad activation) Instructed to hold 2-3 seconds and relax.   Supine Glut sets, x10, min VC to hold 2-3 seconds, relax and repeat.                      PT Education - 04/06/16 1552    Education provided Yes   Education Details HEP initiated   [employer]) Educated Patient   Methods Explanation;Tactile cues;Verbal cues   Comprehension Verbalized understanding;Verbal cues required             PT Long Term Goals - 04/06/16 1610    PT LONG TERM GOAL #1   Title Patient will be independent in HEP in order to increase patient's ability to maintain gains achieved in therapy and assist with return to PLOF.    Time 8   Period Weeks   Status New   PT LONG TERM GOAL #2   Title Patient will increase overall strength to 4+/5 in BUE and 3+/5 B hip/knee extension in order to complete transfers safely with decreased risk for falls.    Time 8   Period Weeks   Status New   PT LONG TERM GOAL #3   Title Patient will demonstrate ability to stand for 3 minutes with least restrictive device in order to allow for pressure relief and  partcipation in ADLs.   Time 8   Period Weeks   Status New   PT LONG TERM GOAL #4   Title Patient will be able to walk 10 steps with least restrictive device with min assist in order to walk from w/c to bathroom safely   Time 8   Period Weeks   Status New   PT LONG TERM GOAL #5   Title Patient will perform stand pivot transfers with least restrictive device and mod assist in order to decrease caregiver burden and increase participation in ADLs.    Time 8   Period Weeks   Status New   Additional Long Term Goals   Additional Long Term Goals Yes   PT LONG TERM GOAL #6   Title Patient will demonstrate ability to perform bed mobility rolling to bilateral directions in order to improve ability to pressure relief.    Time 8   Period Weeks   Status New               Plan - 04/06/16 1557    Clinical Impression Statement Patient is a 45 y.o. female diagnosed with progressive primary MS in 2005. Before 2015, patient was able to walk, drive, and maintain functional independence. Patient now unable to stand or transfer unless is total assist with [facility] lift. Patient has fair seated balance with ability to move UE when seated with no back support; however with increase UE movements patient loses balance backwards. She is able to sit for extended periods of time at supervision level when using BUE support. Patient has impaired sensation with response to only deep pressure in bilateral quads and responds to light touch at ankle crease, upper part of the lower leg anterior/posteriorly and bottom of foot but  has limited to no sensation on dorsum of foot and above knee. Patient has very limited LE muscle strength and is unable to perform any motion through its full range other than ankle DF. Patient has full BUE AROM and only mild weakness. Patient would benefit from contined skilled PT  in order to address weakness, increase activity tolerance, improve seated balance, improve bed mobility/transfers,  and progress to standing and taking steps.    Rehab Potential Fair   Clinical Impairments Affecting Rehab Potential Positive Factors: young, motivated Negative Factors: progressive disease, little-no LE strength, co-morbidities Clinical Presentation: evolving-progressive nature of the disease, extreme LE weakness   PT Frequency 2x / week   PT Duration 8 weeks   PT Treatment/Interventions ADLs/Self Care Home Management;Biofeedback;Electrical Stimulation;Cryotherapy;Ultrasound;DME Instruction;Gait training;Functional mobility training;Therapeutic activities;Therapeutic exercise;Balance training;Neuromuscular re-education;Patient/family education;Energy conservation;Passive range of motion;Wheelchair mobility training;Manual techniques   PT Next Visit Plan assess bed mobility, strengthening, supported standing, seated posture   PT Home Exercise Plan HEP initiated   Consulted and Agree with Plan of Care Patient      Patient will benefit from skilled therapeutic intervention in order to improve the following deficits and impairments:  Decreased activity tolerance, Decreased balance, Decreased coordination, Decreased endurance, Decreased mobility, Decreased safety awareness, Increased edema, Hypomobility, Decreased strength, Abnormal gait, Impaired flexibility, Impaired sensation, Impaired tone, Pain, Obesity, Postural dysfunction, Improper body mechanics  Visit Diagnosis: Muscle weakness (generalized)  Unspecified lack of coordination  Unsteadiness on feet      G-Codes - 04/14/16 1628    Functional Assessment Tool Used clinical judgement, strength, mobility   Functional Limitation Mobility: Walking and moving around   Mobility: Walking and Moving Around Current Status [phone]) At least 60 percent but less than 80 percent impaired, limited or restricted   Mobility: Walking and Moving Around Goal Status [phone]) At least 40 percent but less than 60 percent impaired, limited or restricted        Problem List Patient Active Problem List   Diagnosis Date Noted  . Sepsis (HCC) 01/12/2016  . Complicated UTI (urinary tract infection) 08/09/2015  . Exacerbation of multiple sclerosis (HCC) 08/09/2015  . 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 Multiple sclerosis (HCC)   . Neuropathic pain 05/21/2015  . 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 History of spinal surgery 04/29/2015  . Edema leg 09/05/2014  . Adiposity 09/05/2014  . Abdominal wall hernia 09/05/2014  . 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 Neurogenic bowel 08/12/2012  . Infection of urinary tract 04/07/2006  . 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 I have personally read, edited and approve of the note as written.  Trotter,Margaret PT, DPT April 14, 2016, 4:31 PM  [facility] MAIN [facility] SERVICES [address] [place], [place], [address] Phone: [phone]   Fax:  [phone]  Name: TYAN LASYONE MRN: [id number] Date of Birth: 08/05/1971

## 2016-04-06 NOTE — Patient Instructions (Addendum)
Hamstring Stretch    Reach down along both legs until a comfortable stretch is felt in back of thigh. Be sure to keep knee straight. Hold __10__ seconds. Repeat _3___ times per set. Do __2__ sets per session. Do _2___ sessions per day.  http://orth.exer.us/157   Copyright  VHI. All rights reserved.  Gluteal Sets    Tighten bottom. Hold for _2-3__ seconds. Relax for _5__ seconds. Repeat __5_ times. Do _3-4__ times a day.   Copyright  VHI. All rights reserved.  Quad [employer] top of left thigh. Hold for _2-3__ seconds. Relax for __5_ seconds. Repeat _5__ times. Do __3-4_ times a day. Repeat with other leg.    Copyright  VHI. All rights reserved.

## 2016-04-12 ENCOUNTER — Ambulatory Visit: Payer: Medicare Other | Admitting: Physical Therapy

## 2016-04-14 ENCOUNTER — Ambulatory Visit: Payer: Medicare Other | Admitting: Physical Therapy

## 2016-04-14 ENCOUNTER — Encounter: Payer: Self-pay | Admitting: Physical Therapy

## 2016-04-14 DIAGNOSIS — R279 Unspecified lack of coordination: Secondary | ICD-10-CM

## 2016-04-14 DIAGNOSIS — M6281 Muscle weakness (generalized): Secondary | ICD-10-CM

## 2016-04-14 DIAGNOSIS — R2681 Unsteadiness on feet: Secondary | ICD-10-CM

## 2016-04-14 NOTE — Therapy (Signed)
[place] [facility] MAIN [facility] SERVICES [address] [place], [place], [address] Phone: [phone]   Fax:  [phone]  Physical Therapy Treatment  Patient Details  Name: [name] MRN: [id number] Date of Birth: Nov 23, 1970 Referring Provider: Dr Leone Brand (neurologist), Dr. Marcello Fennel (referring provide)  Encounter Date: 04/14/2016      PT End of Session - 04/14/16 1507    Visit Number 2   Number of Visits 17   Date for PT Re-Evaluation 06/01/16   Authorization Type G code 2   Authorization Time Period 10   PT Start Time 1305   PT Stop Time 1350   PT Time Calculation (min) 45 min   Equipment Utilized During Treatment Gait belt;Other (comment)  slide board   Activity Tolerance Patient tolerated treatment well   Behavior During Therapy [facility] for tasks assessed/performed      Past Medical History  Diagnosis Date  . MS (multiple sclerosis) (HCC) 2005  . Abdominal wall hernia 09/05/2014  . 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 Edema leg 09/05/2014  . 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 Neurogenic bowel 08/12/2012  . Neuropathic pain 05/21/2015    Past Surgical History  Procedure Laterality Date  . Revision urostomy cutaneous  06/2012  . Back surgery  2004  . Abdominal hysterectomy  2001    There were no vitals filed for this visit.      Subjective Assessment - 04/14/16 1505    Subjective Patient reports doing okay today; She reports no new falls. she stated, "I would like to try and come 2x a week if I can. It just may be hard with transportation."    Pertinent History Pertinent Hx for Therapy: progressive diagnosis, older mother is primary care giver, limited transportation    Limitations Sitting;Standing;Walking   How long can you sit comfortably? few hours    How long can you stand comfortably? unable   How long can you walk comfortably?  unable   Diagnostic tests MRI in next couple of months   Patient Stated Goals stand walk, get in and out    Currently in Pain? No/denies       TREATMENT: PT instructed patient in sliding board transfer wheelchair<> mat table. She required assistance for placing sliding board. She also required min-mod A for initiating scoot but was able to push well through BUE for transfer. Patient did require mod VCs for hand placement and positioning to improve trunk control and balance.  Patient is mod A for supine<>sitting bed mobility; She required +2 for getting back up into sitting position due to weakness in BUE;  PT performed PROM of each LE hip/knee flexion/extension; PT instructed patient in PNF resisted D2 flexion/extension with cues to increase push through foot to better initiate ankle PF into LE extension. She continues to require AAROM for hip movement having difficulty contracting hip musculature; Patient able to initiate quad set on LLE, demonstrating minimal twitch on RLE;  PT applied NMES to bilateral quads in supine, 7 sec on, 20 sec off x 10 min at intensity level of #61 for twitch response. Patient able to initiate better quad set on LLE and demontrates good twitch response on RLE following NMES;  Instructed patient to perform quad set in addition to ankle pumps as part of HEP;  PT Education - 04/14/16 1506    Education provided Yes   Education Details transfer training, strengthening   Person(s) Educated Patient   Methods Explanation;Verbal cues   Comprehension Verbalized understanding;Returned demonstration;Verbal cues required             PT Long Term Goals - 04/06/16 1610    PT LONG TERM GOAL #1   Title Patient will be independent in HEP in order to increase patient's ability to maintain gains achieved in therapy and assist with return to PLOF.    Time 8   Period Weeks   Status New   PT LONG TERM GOAL #2   Title  Patient will increase overall strength to 4+/5 in BUE and 3+/5 B hip/knee extension in order to complete transfers safely with decreased risk for falls.    Time 8   Period Weeks   Status New   PT LONG TERM GOAL #3   Title Patient will demonstrate ability to stand for 3 minutes with least restrictive device in order to allow for pressure relief and partcipation in ADLs.   Time 8   Period Weeks   Status New   PT LONG TERM GOAL #4   Title Patient will be able to walk 10 steps with least restrictive device with min assist in order to walk from w/c to bathroom safely   Time 8   Period Weeks   Status New   PT LONG TERM GOAL #5   Title Patient will perform stand pivot transfers with least restrictive device and mod assist in order to decrease caregiver burden and increase participation in ADLs.    Time 8   Period Weeks   Status New   Additional Long Term Goals   Additional Long Term Goals Yes   PT LONG TERM GOAL #6   Title Patient will demonstrate ability to perform bed mobility rolling to bilateral directions in order to improve ability to pressure relief.    Time 8   Period Weeks   Status New               Plan - 04/14/16 1507    Clinical Impression Statement Patient instructed in slide board transfer wheelchair<>mat table. She did require min-mod A and cues for positioning. She was able to scoot with pushing through BUE. Patient able to demonstrate good trunk control; Instructed patient in BLE strengthening exercise. She continues to have difficulty initiating hip/thigh muscle contraction. She was able to initiate twitch response of quad set. PT applied NMES to bilateral quad muscles for large muscle atrophy. She was able to demonstrate better quad set initiation following NMES. She would benefit from additional skilled PT Intervention to improve strength, balance and mobility;    Rehab Potential Fair   Clinical Impairments Affecting Rehab Potential Positive Factors: young,  motivated Negative Factors: progressive disease, little-no LE strength, co-morbidities Clinical Presentation: evolving-progressive nature of the disease, extreme LE weakness   PT Frequency 2x / week   PT Duration 8 weeks   PT Treatment/Interventions ADLs/Self Care Home Management;Biofeedback;Electrical Stimulation;Cryotherapy;Ultrasound;DME Instruction;Gait training;Functional mobility training;Therapeutic activities;Therapeutic exercise;Balance training;Neuromuscular re-education;Patient/family education;Energy conservation;Passive range of motion;Wheelchair mobility training;Manual techniques   PT Next Visit Plan assess bed mobility, strengthening, supported standing, seated posture   PT Home Exercise Plan continue as given;    Consulted and Agree with Plan of Care Patient      Patient will benefit from skilled therapeutic intervention in order to improve the following deficits and impairments:  Decreased activity tolerance, Decreased balance,  Decreased coordination, Decreased endurance, Decreased mobility, Decreased safety awareness, Increased edema, Hypomobility, Decreased strength, Abnormal gait, Impaired flexibility, Impaired sensation, Impaired tone, Pain, Obesity, Postural dysfunction, Improper body mechanics  Visit Diagnosis: Muscle weakness (generalized)  Unspecified lack of coordination  Unsteadiness on feet     Problem List Patient Active Problem List   Diagnosis Date Noted  . Sepsis (HCC) 01/12/2016  . Complicated UTI (urinary tract infection) 08/09/2015  . Exacerbation of multiple sclerosis (HCC) 08/09/2015  . 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 Multiple sclerosis (HCC)   . Neuropathic pain 05/21/2015  . 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 History of spinal surgery 04/29/2015  . Edema leg 09/05/2014  . Adiposity 09/05/2014  . Abdominal wall hernia 09/05/2014  . Multiple sclerosis, primary progressive (HCC)  01/09/2013  . Neurogenic bowel 08/12/2012  . Infection of urinary tract 04/07/2006  . 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 [place], [place], [address] Phone: [phone]   Fax:  [phone]  Name: ARLIE POSCH MRN: [id number] Date of Birth: 30-Jul-1971

## 2016-04-18 ENCOUNTER — Ambulatory Visit: Payer: Medicare Other | Admitting: Physical Therapy

## 2016-04-19 ENCOUNTER — Encounter: Payer: Self-pay | Admitting: Physical Therapy

## 2016-04-19 ENCOUNTER — Ambulatory Visit: Payer: Medicare Other | Admitting: Physical Therapy

## 2016-04-19 DIAGNOSIS — R279 Unspecified lack of coordination: Secondary | ICD-10-CM

## 2016-04-19 DIAGNOSIS — M6281 Muscle weakness (generalized): Secondary | ICD-10-CM | POA: Diagnosis not present

## 2016-04-19 DIAGNOSIS — R2681 Unsteadiness on feet: Secondary | ICD-10-CM

## 2016-04-19 NOTE — Therapy (Signed)
[place] [facility] [place] MAIN [facility] SERVICES [address] [place], [place], [address] Phone: [phone]   Fax:  [phone]  Physical Therapy Treatment  Patient Details  Name: [name] MRN: [id number] Date of Birth: 1971-07-20 Referring Provider: Dr Leone Brand (neurologist), Dr. Marcello Fennel (referring provide)  Encounter Date: 04/19/2016      PT End of Session - 04/19/16 1443    Visit Number 3   Number of Visits 17   Date for PT Re-Evaluation 06/01/16   Authorization Type G code 3   Authorization Time Period 10   PT Start Time 1445   PT Stop Time 1530   PT Time Calculation (min) 45 min   Equipment Utilized During Treatment Gait belt;Other (comment)  slide board   Activity Tolerance Patient tolerated treatment well   Behavior During Therapy [facility] for tasks assessed/performed      Past Medical History:  Diagnosis Date  . Abdominal wall hernia 09/05/2014  . 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 Edema leg 09/05/2014  . 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 MS (multiple sclerosis) (HCC) 2005  . 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 Neurogenic bowel 08/12/2012  . Neuropathic pain 05/21/2015    Past Surgical History:  Procedure Laterality Date  . ABDOMINAL HYSTERECTOMY  2001  . BACK SURGERY  2004  . REVISION UROSTOMY CUTANEOUS  06/2012    There were no vitals filed for this visit.      Subjective Assessment - 04/19/16 1650    Subjective Patient reports doing well; She reports compliance with HEP; She reports that she still hasn't gotten a sliding board yet as she is going to have one made.    Pertinent History Pertinent Hx for Therapy: progressive diagnosis, older mother is primary care giver, limited transportation    Limitations Sitting;Standing;Walking   How long can you sit comfortably? few hours    How long can you stand comfortably? unable   How long can you walk comfortably?  unable   Diagnostic tests MRI in next couple of months   Patient Stated Goals stand walk, get in and out    Currently in Pain? No/denies       TREATMENT: Prior to exercise: Patient instructed in sliding board transfer from wheelchair<>mat table, min-mod A with BLE knee blocking x1 rep; Patient required mod VCs to increase push and scoot backwards   PT performed partial stand for harness placement; PT lifted patient into stander; utilized mirror and mod VCs to instruct patient into increased upper trunk extension for less UE weight bearing on table.   Standing in stander: BUE shoulder abduction 2x5; Single UE overhead reach x10 each UE; BUE ball throw x10  Patient had increased difficulty utilizing upper back extensors to reduce UE weight bearing due to weakness. She reports increased fatigue in shoulders and back.  Following standing/transfers: Seated: Feet unsupported, UE supported: Ankle DF AROM x10 bilaterally; Knee extension partial LAQ x5 bilaterally;  Feet supported, UE supported: Ankle DF x10 with cues to increase foot slap into PF; Knee extension Leg kick (partial) x5 bilaterally; Patient able to demonstrate better RLE knee extensor activation as compared to last session; She does fatigue with increased repetition;  Patient required min A for slide board transfer back to wheelchair with assistance for board placement and knee blocking;                           PT  Education - 04/19/16 1651    Education provided Yes   Education Details transfer training, strengthening   Person(s) Educated Patient   Methods Explanation;Verbal cues   Comprehension Verbalized understanding;Returned demonstration;Verbal cues required             PT Long Term Goals - 04/06/16 1610      PT LONG TERM GOAL #1   Title Patient will be independent in HEP in order to increase patient's ability to maintain gains achieved in therapy and assist with return to PLOF.     Time 8   Period Weeks   Status New     PT LONG TERM GOAL #2   Title Patient will increase overall strength to 4+/5 in BUE and 3+/5 B hip/knee extension in order to complete transfers safely with decreased risk for falls.    Time 8   Period Weeks   Status New     PT LONG TERM GOAL #3   Title Patient will demonstrate ability to stand for 3 minutes with least restrictive device in order to allow for pressure relief and partcipation in ADLs.   Time 8   Period Weeks   Status New     PT LONG TERM GOAL #4   Title Patient will be able to walk 10 steps with least restrictive device with min assist in order to walk from w/c to bathroom safely   Time 8   Period Weeks   Status New     PT LONG TERM GOAL #5   Title Patient will perform stand pivot transfers with least restrictive device and mod assist in order to decrease caregiver burden and increase participation in ADLs.    Time 8   Period Weeks   Status New     Additional Long Term Goals   Additional Long Term Goals Yes     PT LONG TERM GOAL #6   Title Patient will demonstrate ability to perform bed mobility rolling to bilateral directions in order to improve ability to pressure relief.    Time 8   Period Weeks   Status New               Plan - 04/19/16 1651    Clinical Impression Statement Instructed patient in slide board transfers wheelchair<>mat table. Patient able to initiate slide better but does fatigue having trouble with scooting backwards. Patient was instructed in standing tasks with stander with cues to reduce UE weight bearing to faciliate active hip and back extension. Patient had difficulty reduing HHA due to weakness in hip/back. She would benefit from additional skilled PT intervention to improve LE strength, balance, and functional mobility;    Rehab Potential Fair   Clinical Impairments Affecting Rehab Potential Positive Factors: young, motivated Negative Factors: progressive disease, little-no LE strength,  co-morbidities Clinical Presentation: evolving-progressive nature of the disease, extreme LE weakness   PT Frequency 2x / week   PT Duration 8 weeks   PT Treatment/Interventions ADLs/Self Care Home Management;Biofeedback;Electrical Stimulation;Cryotherapy;Ultrasound;DME Instruction;Gait training;Functional mobility training;Therapeutic activities;Therapeutic exercise;Balance training;Neuromuscular re-education;Patient/family education;Energy conservation;Passive range of motion;Wheelchair mobility training;Manual techniques   PT Next Visit Plan assess bed mobility, strengthening, supported standing, seated posture   PT Home Exercise Plan continue as given;    Consulted and Agree with Plan of Care Patient      Patient will benefit from skilled therapeutic intervention in order to improve the following deficits and impairments:  Decreased activity tolerance, Decreased balance, Decreased coordination, Decreased endurance, Decreased mobility, Decreased safety awareness, Increased edema,  Hypomobility, Decreased strength, Abnormal gait, Impaired flexibility, Impaired sensation, Impaired tone, Pain, Obesity, Postural dysfunction, Improper body mechanics  Visit Diagnosis: Muscle weakness (generalized)  Unspecified lack of coordination  Unsteadiness on feet     Problem List Patient Active Problem List   Diagnosis Date Noted  . Sepsis (HCC) 01/12/2016  . Complicated UTI (urinary tract infection) 08/09/2015  . Exacerbation of multiple sclerosis (HCC) 08/09/2015  . 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 Multiple sclerosis (HCC)   . Neuropathic pain 05/21/2015  . 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 History of spinal surgery 04/29/2015  . Edema leg 09/05/2014  . Adiposity 09/05/2014  . Abdominal wall hernia 09/05/2014  . 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 Neurogenic bowel 08/12/2012  . Infection of urinary  tract 04/07/2006  . 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

## 2016-04-21 ENCOUNTER — Ambulatory Visit: Payer: Medicare Other | Admitting: Physical Therapy

## 2016-04-25 ENCOUNTER — Encounter: Payer: Self-pay | Admitting: Physical Therapy

## 2016-04-25 ENCOUNTER — Ambulatory Visit: Payer: Medicare Other | Admitting: Physical Therapy

## 2016-04-25 DIAGNOSIS — R2681 Unsteadiness on feet: Secondary | ICD-10-CM

## 2016-04-25 DIAGNOSIS — M6281 Muscle weakness (generalized): Secondary | ICD-10-CM

## 2016-04-25 DIAGNOSIS — R279 Unspecified lack of coordination: Secondary | ICD-10-CM

## 2016-04-25 NOTE — Therapy (Signed)
[place] [facility] MAIN [facility] SERVICES [address] [place], [place], [address] Phone: [phone]   Fax:  [phone]  Physical Therapy Treatment  Patient Details  Name: [name] MRN: [id number] Date of Birth: May 10, 1971 Referring Provider: Dr Leone Brand (neurologist), Dr. Marcello Fennel (referring provide)  Encounter Date: 04/25/2016      PT End of Session - 04/25/16 1355    Visit Number 4   Number of Visits 17   Date for PT Re-Evaluation 06/01/16   Authorization Type G code 4   Authorization Time Period 10   PT Start Time 1315   PT Stop Time 1345   PT Time Calculation (min) 30 min   Equipment Utilized During Treatment Gait belt;Other (comment)  slide board   Activity Tolerance Patient tolerated treatment well   Behavior During Therapy [facility] for tasks assessed/performed      Past Medical History:  Diagnosis Date  . Abdominal wall hernia 09/05/2014  . 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 Edema leg 09/05/2014  . 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 MS (multiple sclerosis) (HCC) 2005  . 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 Neurogenic bowel 08/12/2012  . Neuropathic pain 05/21/2015    Past Surgical History:  Procedure Laterality Date  . ABDOMINAL HYSTERECTOMY  2001  . BACK SURGERY  2004  . REVISION UROSTOMY CUTANEOUS  06/2012    There were no vitals filed for this visit.      Subjective Assessment - 04/25/16 1353    Subjective "I am so sorry I am late." Patient reports getting held up by transportation. She reports having a good weekend. No new changes. Reports no new falls;    Pertinent History Pertinent Hx for Therapy: progressive diagnosis, older mother is primary care giver, limited transportation    Limitations Sitting;Standing;Walking   How long can you sit comfortably? few hours    How long can you stand comfortably? unable   How long can you walk comfortably? unable   Diagnostic tests MRI in next couple of months   Patient Stated Goals stand walk, get in and out    Currently in Pain? No/denies         TREATMENT: Patient transferred with sliding board wheelchair<>mat table x 2 reps with close supervision during initiation and then min A to finish transfer. Patient requires knees being blocked and cues for sequencing, hand placement etc to improve scooting towards mat table/wheelchair;  Sitting unsupported; BUE yellow weighted ball (2000 gram) overhead press x10; BUE wand push/pull x10 with manual resistance; BUE tricep press on yoga blocks for UE strengthening to improve transfer ability x10; BUE ball toss yellow weighted ball x15;  Patient required mod VCs to improve weight shift, increase erect trunk control and to increase ROM in BUE for better postural strengthening;  Patient does have episodes of falling backwards (posterior loss of balance) but is able to push back up into sitting position with supervision-min A;                         PT Education - 04/25/16 1354    Education provided Yes   Education Details transfer training, balance exercise, strengthening;    Person(s) Educated Patient   Methods Explanation;Verbal cues   Comprehension Verbalized understanding;Returned demonstration;Verbal cues required             PT Long Term Goals - 04/06/16 1610      PT LONG TERM  GOAL #1   Title Patient will be independent in HEP in order to increase patient's ability to maintain gains achieved in therapy and assist with return to PLOF.    Time 8   Period Weeks   Status New     PT LONG TERM GOAL #2   Title Patient will increase overall strength to 4+/5 in BUE and 3+/5 B hip/knee extension in order to complete transfers safely with decreased risk for falls.    Time 8   Period Weeks   Status New     PT LONG TERM GOAL #3   Title Patient will demonstrate ability to stand for 3 minutes with least restrictive device in  order to allow for pressure relief and partcipation in ADLs.   Time 8   Period Weeks   Status New     PT LONG TERM GOAL #4   Title Patient will be able to walk 10 steps with least restrictive device with min assist in order to walk from w/c to bathroom safely   Time 8   Period Weeks   Status New     PT LONG TERM GOAL #5   Title Patient will perform stand pivot transfers with least restrictive device and mod assist in order to decrease caregiver burden and increase participation in ADLs.    Time 8   Period Weeks   Status New     Additional Long Term Goals   Additional Long Term Goals Yes     PT LONG TERM GOAL #6   Title Patient will demonstrate ability to perform bed mobility rolling to bilateral directions in order to improve ability to pressure relief.    Time 8   Period Weeks   Status New               Plan - 04/25/16 1356    Clinical Impression Statement Instructed patient in slide board transfers. Patient was able to initiate transfer better today, but continues to require cues for sequencing and to improve weight shift for transfer ability; Instructed patient in BUE strengthening and core abdominal exercise in unsupported sitting. Patient exhibits decreased trunk control with episodes of falling backwards. She is able to self correct and come back into sitting with supervision-min A. Patient would benefit from additional skilled PT Intervention to improve balance and strength for better functional mobility.    Rehab Potential Fair   Clinical Impairments Affecting Rehab Potential Positive Factors: young, motivated Negative Factors: progressive disease, little-no LE strength, co-morbidities Clinical Presentation: evolving-progressive nature of the disease, extreme LE weakness   PT Frequency 2x / week   PT Duration 8 weeks   PT Treatment/Interventions ADLs/Self Care Home Management;Biofeedback;Electrical Stimulation;Cryotherapy;Ultrasound;DME Instruction;Gait  training;Functional mobility training;Therapeutic activities;Therapeutic exercise;Balance training;Neuromuscular re-education;Patient/family education;Energy conservation;Passive range of motion;Wheelchair mobility training;Manual techniques   PT Next Visit Plan assess bed mobility, strengthening, supported standing, seated posture   PT Home Exercise Plan continue as given;    Consulted and Agree with Plan of Care Patient      Patient will benefit from skilled therapeutic intervention in order to improve the following deficits and impairments:  Decreased activity tolerance, Decreased balance, Decreased coordination, Decreased endurance, Decreased mobility, Decreased safety awareness, Increased edema, Hypomobility, Decreased strength, Abnormal gait, Impaired flexibility, Impaired sensation, Impaired tone, Pain, Obesity, Postural dysfunction, Improper body mechanics  Visit Diagnosis: Muscle weakness (generalized)  Unspecified lack of coordination  Unsteadiness on feet     Problem List Patient Active Problem List   Diagnosis Date Noted  .  Sepsis (HCC) 01/12/2016  . Complicated UTI (urinary tract infection) 08/09/2015  . Exacerbation of multiple sclerosis (HCC) 08/09/2015  . 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 Multiple sclerosis (HCC)   . Neuropathic pain 05/21/2015  . 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 History of spinal surgery 04/29/2015  . Edema leg 09/05/2014  . Adiposity 09/05/2014  . Abdominal wall hernia 09/05/2014  . 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 Neurogenic bowel 08/12/2012  . Infection of urinary tract 04/07/2006  . 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

## 2016-04-26 ENCOUNTER — Ambulatory Visit: Payer: Medicare Other | Admitting: Physical Therapy

## 2016-04-28 ENCOUNTER — Ambulatory Visit: Payer: Medicare Other | Admitting: Physical Therapy

## 2016-05-02 ENCOUNTER — Ambulatory Visit: Payer: Medicare Other | Admitting: Physical Therapy

## 2016-05-03 ENCOUNTER — Ambulatory Visit: Payer: Medicare Other | Admitting: Physical Therapy

## 2016-05-05 ENCOUNTER — Ambulatory Visit: Payer: Medicare Other | Admitting: Physical Therapy

## 2016-05-10 ENCOUNTER — Ambulatory Visit: Payer: Medicare Other | Admitting: Physical Therapy

## 2016-05-12 ENCOUNTER — Ambulatory Visit: Payer: Medicare Other | Admitting: Physical Therapy

## 2016-05-12 ENCOUNTER — Encounter: Payer: Self-pay | Admitting: Physical Therapy

## 2016-05-12 ENCOUNTER — Ambulatory Visit: Payer: Medicare Other | Attending: Internal Medicine | Admitting: Physical Therapy

## 2016-05-12 DIAGNOSIS — R279 Unspecified lack of coordination: Secondary | ICD-10-CM | POA: Diagnosis present

## 2016-05-12 DIAGNOSIS — R2681 Unsteadiness on feet: Secondary | ICD-10-CM | POA: Diagnosis present

## 2016-05-12 DIAGNOSIS — M6281 Muscle weakness (generalized): Secondary | ICD-10-CM

## 2016-05-12 NOTE — Therapy (Signed)
[facility] MAIN [facility] SERVICES [address] [place], [place], [address] Phone: [phone]   Fax:  [phone]  Physical Therapy Treatment  Patient Details  Name: [name] MRN: [id number] Date of Birth: [birth year] Referring Provider: Dr Leone Brand (neurologist), Dr. Marcello Fennel (referring provide)  Encounter Date: 05/12/2016      PT End of Session - 05/12/16 1413    Visit Number 5   Number of Visits 17   Date for PT Re-Evaluation 06/01/16   Authorization Type G code 5   Authorization Time Period 10   PT Start Time 1308   PT Stop Time 1401   PT Time Calculation (min) 53 min   Equipment Utilized During Treatment Gait belt;Other (comment)  slide board   Activity Tolerance Patient tolerated treatment well   Behavior During Therapy [facility] for tasks assessed/performed      Past Medical History:  Diagnosis Date  . Abdominal wall hernia 09/05/2014  . 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 Edema leg 09/05/2014  . 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 MS (multiple sclerosis) (HCC) 2005  . 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 Neurogenic bowel 08/12/2012  . Neuropathic pain 05/21/2015    Past Surgical History:  Procedure Laterality Date  . ABDOMINAL HYSTERECTOMY  2001  . BACK SURGERY  2004  . REVISION UROSTOMY CUTANEOUS  06/2012    There were no vitals filed for this visit.      Subjective Assessment - 05/12/16 1411    Subjective Patient reports she is doing okay. She states she has been doing her HEP the best she can. No new changes.   Pertinent History Pertinent Hx for Therapy: progressive diagnosis, older mother is primary care giver, limited transportation    Limitations Sitting;Standing;Walking   How long can you sit comfortably? few hours    How long can you stand comfortably? unable   How long can you walk comfortably? unable   Diagnostic tests MRI in next couple of months    Patient Stated Goals stand walk, get in and out    Currently in Pain? Yes   Pain Score 6    Pain Location Leg   Pain Orientation Right;Left   Pain Descriptors / Indicators Aching      Treatment:  Pre-activities: sliding board transfer to the R with minA and min VCs for RUE positioning as well as pushing through both BUE and BLE.  Seated balance activities: Supervision-minA to maintain-regain balance during seated activities. 1 large loss of balance backwards with patient and SPT providing minA to regain balance BUE shoulder presses 2000g ball weight, 2x10 Tricep pushes on yoga blocks, 2x10, min VCs to lean forward when performing and push through LEs. Cross body reaching with each UE, x10 each UE, min VCs to come back to neutral in between.  Bed mobility training: Moving from sitting to propped on R elbow, 2x5, difficult to move from propped on elbow to upright sitting. Instructed to move from propped on elbow to bringing elbow closer to her body x3, min VCs to perform this as SPT lifts her legs. MinA to move from sit to R sidelying. Patient began to roll forward, SPT supported and moved her backwards and into supine.  Patient brought towards supine and then instructed to pull into R sidelying, x5, increased distance patient had to roll, 2x8, and then increased to patient starting supine and moving into full rolling, x8 min VCs to push with RLE, minA  with hip movement.    Bed mobility moving from L sidelying to sitting, min VCs to perform opposite arm movement when sitting up, modA to move into this position due to patient having difficulty pushing up with BUE.   Supine TherEx: In bridge position, instructed patient to push through BLE and attempt to lift hips, 2x10, SPT keeping BLE in position, min VCs not to hold breath. BLE extension with foot on pillow case, x5 each LE, min VCs to push outwards. D1 stretching, BLE, x10 each LE, increased tone on LLE, decreased tone at the end of  stretching. Quad sets, x5, no contraction on LLE, trace contraction on RLE, min VCs not to hold breath and to push knee into mat.  Instructed patient to maintain hips in ADD position in hooklying, patient unable to do so without assistance.                                  PT Education - 05/12/16 1412    Education provided Yes   Education Details bed mobility, transfer training, core work   Starwood HotelsPerson(s) Educated Patient   Methods Explanation;Demonstration   Comprehension Verbalized understanding;Returned demonstration             PT Long Term Goals - 04/06/16 1610      PT LONG TERM GOAL #1   Title Patient will be independent in HEP in order to increase patient's ability to maintain gains achieved in therapy and assist with return to PLOF.    Time 8   Period Weeks   Status New     PT LONG TERM GOAL #2   Title Patient will increase overall strength to 4+/5 in BUE and 3+/5 B hip/knee extension in order to complete transfers safely with decreased risk for falls.    Time 8   Period Weeks   Status New     PT LONG TERM GOAL #3   Title Patient will demonstrate ability to stand for 3 minutes with least restrictive device in order to allow for pressure relief and partcipation in ADLs.   Time 8   Period Weeks   Status New     PT LONG TERM GOAL #4   Title Patient will be able to walk 10 steps with least restrictive device with min assist in order to walk from w/c to bathroom safely   Time 8   Period Weeks   Status New     PT LONG TERM GOAL #5   Title Patient will perform stand pivot transfers with least restrictive device and mod assist in order to decrease caregiver burden and increase participation in ADLs.    Time 8   Period Weeks   Status New     Additional Long Term Goals   Additional Long Term Goals Yes     PT LONG TERM GOAL #6   Title Patient will demonstrate ability to perform bed mobility rolling to bilateral directions in order to improve  ability to pressure relief.    Time 8   Period Weeks   Status New               Plan - 05/12/16 1414    Clinical Impression Statement Patient continues to requires min VCs and minA for sliding board transfer. She is able to appropriately move her body to set up for transfer without VCS. She was instructed in seated core exercises with 1 large LOB post.  that patient was able to regain with minA.  Several shifts post. and R lateral with support to decrease continuing loss of balance with supervision-minA to regain. During bed mobility from sitting to laying, patient requires minA with BLE and mild loss of balance forward with SPT regaining and moving her backwards.  Patient able to participate in bed mobility with minA at hips. Min A for to lie down and modA to sit up due to difficulty with UE pushing. Patient would benefit from continued skilled PT to address balance, strength, and functional mobility.    Rehab Potential Fair   Clinical Impairments Affecting Rehab Potential Positive Factors: young, motivated Negative Factors: progressive disease, little-no LE strength, co-morbidities Clinical Presentation: evolving-progressive nature of the disease, extreme LE weakness   PT Frequency 2x / week   PT Duration 8 weeks   PT Treatment/Interventions ADLs/Self Care Home Management;Biofeedback;Electrical Stimulation;Cryotherapy;Ultrasound;DME Instruction;Gait training;Functional mobility training;Therapeutic activities;Therapeutic exercise;Balance training;Neuromuscular re-education;Patient/family education;Energy conservation;Passive range of motion;Wheelchair mobility training;Manual techniques   PT Next Visit Plan bed mobility, strengthening, supported standing, seated posture   PT Home Exercise Plan continue as given;    Consulted and Agree with Plan of Care Patient      Patient will benefit from skilled therapeutic intervention in order to improve the following deficits and impairments:   Decreased activity tolerance, Decreased balance, Decreased coordination, Decreased endurance, Decreased mobility, Decreased safety awareness, Increased edema, Hypomobility, Decreased strength, Abnormal gait, Impaired flexibility, Impaired sensation, Impaired tone, Pain, Obesity, Postural dysfunction, Improper body mechanics  Visit Diagnosis: Muscle weakness (generalized)  Unspecified lack of coordination  Unsteadiness on feet     Problem List Patient Active Problem List   Diagnosis Date Noted  . Sepsis (HCC) 01/12/2016  . Complicated UTI (urinary tract infection) 08/09/2015  . Exacerbation of multiple sclerosis (HCC) 08/09/2015  . 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 Multiple sclerosis (HCC)   . Neuropathic pain 05/21/2015  . 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 History of spinal surgery 04/29/2015  . Edema leg 09/05/2014  . Adiposity 09/05/2014  . Abdominal wall hernia 09/05/2014  . 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 Neurogenic bowel 08/12/2012  . Infection of urinary tract 04/07/2006  . 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 I have personally read, edited and approve of the note as written.  Trotter,Margaret PT, DPT 05/12/2016, 3:54 PM  [place] [facility] MAIN [facility] SERVICES [address] [place], [place], [address] Phone: [phone]   Fax:  [phone]  Name: [name] MRN: [id number] Date of Birth: 1971-08-11

## 2016-05-17 ENCOUNTER — Ambulatory Visit: Payer: Medicare Other | Admitting: Physical Therapy

## 2016-05-17 ENCOUNTER — Encounter: Payer: Self-pay | Admitting: Physical Therapy

## 2016-05-17 DIAGNOSIS — M6281 Muscle weakness (generalized): Secondary | ICD-10-CM

## 2016-05-17 DIAGNOSIS — R279 Unspecified lack of coordination: Secondary | ICD-10-CM

## 2016-05-17 DIAGNOSIS — R2681 Unsteadiness on feet: Secondary | ICD-10-CM

## 2016-05-17 NOTE — Therapy (Signed)
[facility] MAIN [facility] [address] [place], [place], [address] Phone: [phone]   Fax:  [phone]  Physical Therapy Treatment  Patient Details  Name: Samantha LarryBernetta L Yeldell MRN: [id number] Date of Birth: 10/05/1970 Referring Provider: Dr Leone BrandMarkovic-Plese (neurologist), Dr. Marcello FennelHande (referring provide)  Encounter Date: 05/17/2016      PT End of Session - 05/17/16 1504    Visit Number 6   Number of Visits 17   Date for PT Re-Evaluation 06/01/16   Authorization Type G code 6   Authorization Time Period 10   PT Start Time 1306   PT Stop Time 1346   PT Time Calculation (min) 40 min   Equipment Utilized During Treatment Gait belt;Other (comment)  slide board   Activity Tolerance Patient tolerated treatment well   Behavior [facility] for tasks assessed/performed      Past Medical History:  Diagnosis Date  . Abdominal wall hernia 09/05/2014  . 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 Edema leg 09/05/2014  . 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 MS (multiple sclerosis) (HCC) 2005  . 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 Neurogenic bowel 08/12/2012  . Neuropathic pain 05/21/2015    Past Surgical History:  Procedure Laterality Date  . ABDOMINAL HYSTERECTOMY  2001  . BACK SURGERY  2004  . REVISION UROSTOMY CUTANEOUS  06/2012    There were no vitals filed for this visit.      Subjective Assessment - 05/17/16 1307    Subjective Patient reports no new changes and that she is doing well today.    Pertinent History Pertinent Hx for Therapy: progressive diagnosis, older mother is primary care giver, limited transportation    Limitations Sitting;Standing;Walking   How long can you sit comfortably? few hours    How long can you stand comfortably? unable   How long can you walk comfortably? unable   Diagnostic tests MRI in next couple of months   Patient Stated Goals stand walk, get  in and out    Currently in Pain? Yes   Pain Score 6    Pain Location Leg   Pain Orientation Right;Left   Pain Descriptors / Indicators Aching      Treatment:  Pre-activities: sliding board transfer to the R with minA and min VCs for RUE positioning to increase safety and to increase lean forward to decrease weight on bottom and improve safety.  Seated balance activities: Supervision-minA to maintain-regain balance during seated activities. 2 minor losses of balance with minA to regain balance.  Tricep pushes on yoga blocks, 2x5, min VCs to continue leaning forward when performing and to push through LEs. Tricep push ups on yoga blocks with lower body scooting, x6 each directions, min VCs to perform scoot to L/R after pushing up on blocks and to maintain forward lean. MinA for blocking of BLE for safety.  Reaching for cones, multiplanar, with each UE, x10 each, min VCs to increase distance reached and continue breathing.   Bed mobility training: Instructed to move from sitting to propped on R elbow, 2x5, min VCs to push with elbow to move to seated position again Instructed to move from sidelying to propped on R elbow, unable to perform so instructed to push with LUE, 2x5, min VCs to perform pushing Min VCs to move from elbow to laying flat into sidelying with SPT providing minA to lift LE. Able to roll supine directly from sidelying.  Instructed to move from  sidelying to seated; requires modA of upper body and minA of LE in order to move into a seated position, improved from last session.   Rolling each direction (2x5) Patient brought towards supine (5" from all the way supine) and then instructed to pull into sidelying, opposite LE was put into a hooklying position to make easier, min VCs to really reach with UE across body and to count when rolling to decrease breath holding.    ModA to move from supine to long sitting position with modA to maintain this position with UE  movement.  Sidelying TherEx: Instructed to perform single knee to chest, 2x5 each LE (R able to perform >L), min VCs to count out loud when performing to decrease patient's tendency to hold her breath.  L sidelying, RLE quad and hip flexor stretch, 2x15 seconds each                             PT Education - 05/17/16 1503    Education provided Yes   Education Details bed mobility, transfer training   Person(s) Educated Patient   Methods Explanation;Demonstration;Verbal cues   Comprehension Verbalized understanding;Returned demonstration;Verbal cues required             PT Long Term Goals - 04/06/16 1610      PT LONG TERM GOAL #1   Title Patient will be independent in HEP in order to increase patient's ability to maintain gains achieved in therapy and assist with return to PLOF.    Time 8   Period Weeks   Status New     PT LONG TERM GOAL #2   Title Patient will increase overall strength to 4+/5 in BUE and 3+/5 B hip/knee extension in order to complete transfers safely with decreased risk for falls.    Time 8   Period Weeks   Status New     PT LONG TERM GOAL #3   Title Patient will demonstrate ability to stand for 3 minutes with least restrictive device in order to allow for pressure relief and partcipation in ADLs.   Time 8   Period Weeks   Status New     PT LONG TERM GOAL #4   Title Patient will be able to walk 10 steps with least restrictive device with min assist in order to walk from w/c to bathroom safely   Time 8   Period Weeks   Status New     PT LONG TERM GOAL #5   Title Patient will perform stand pivot transfers with least restrictive device and mod assist in order to decrease caregiver burden and increase participation in ADLs.    Time 8   Period Weeks   Status New     Additional Long Term Goals   Additional Long Term Goals Yes     PT LONG TERM GOAL #6   Title Patient will demonstrate ability to perform bed mobility rolling to  bilateral directions in order to improve ability to pressure relief.    Time 8   Period Weeks   Status New               Plan - 05/17/16 1504    Clinical Impression Statement Patient had improved sliding board transfers with decrease VCs, although she continues to require min VCs for leading hand placement and to maintain forward trunk flexion to unweight bottom and decrease risk for falls. During bed mobility, the patient is able to  participate well but continues to require minA for LE at hips. She is unable to perform a good push up with LUE to move from sidelying to sitting. To move from supine<>sitting, patient requires minA down and modA to sit up. Patient has 2 losses of balance during seated activities that SPT was able to assist in regaining. Patient would continue to benefit from skilled PT in order to address seated transfers and bed mobility, safety, and increase ability to perform self care.    Rehab Potential Fair   Clinical Impairments Affecting Rehab Potential Positive Factors: young, motivated Negative Factors: progressive disease, little-no LE strength, co-morbidities Clinical Presentation: evolving-progressive nature of the disease, extreme LE weakness   PT Frequency 2x / week   PT Duration 8 weeks   PT Treatment/Interventions ADLs/Self Care Home Management;Biofeedback;Electrical Stimulation;Cryotherapy;Ultrasound;DME Instruction;Gait training;Functional mobility training;Therapeutic activities;Therapeutic exercise;Balance training;Neuromuscular re-education;Patient/family education;Energy conservation;Passive range of motion;Wheelchair mobility training;Manual techniques   PT Next Visit Plan bed mobility, sidelying<>seated, strengthening, supported standing, seated posture   PT Home Exercise Plan continue as given;    Consulted and Agree with Plan of Care Patient      Patient will benefit from skilled therapeutic intervention in order to improve the following deficits  and impairments:  Decreased activity tolerance, Decreased balance, Decreased coordination, Decreased endurance, Decreased mobility, Decreased safety awareness, Increased edema, Hypomobility, Decreased strength, Abnormal gait, Impaired flexibility, Impaired sensation, Impaired tone, Pain, Obesity, Postural dysfunction, Improper body mechanics  Visit Diagnosis: Muscle weakness (generalized)  Unspecified lack of coordination  Unsteadiness on feet     Problem List Patient Active Problem List   Diagnosis Date Noted  . Sepsis (HCC) 01/12/2016  . Complicated UTI (urinary tract infection) 08/09/2015  . Exacerbation of multiple sclerosis (HCC) 08/09/2015  . 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 Multiple sclerosis (HCC)   . Neuropathic pain 05/21/2015  . 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 History of spinal surgery 04/29/2015  . Edema leg 09/05/2014  . Adiposity 09/05/2014  . Abdominal wall hernia 09/05/2014  . 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 Neurogenic bowel 08/12/2012  . Infection of urinary tract 04/07/2006  . 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 I have personally read, edited and approve of the note as written.  Trotter,Margaret PT, DPT 05/18/2016, 9:01 AM  [place] [facility] MAIN [facility] SERVICES [address] [place], [place], [address] Phone: [phone]   Fax:  [phone]  Name: FRANCILE WOOLFORD MRN: [id number] Date of Birth: 11/12/1970

## 2016-05-19 ENCOUNTER — Ambulatory Visit: Payer: Medicare Other | Admitting: Physical Therapy

## 2016-05-19 ENCOUNTER — Encounter: Payer: Self-pay | Admitting: Physical Therapy

## 2016-05-19 DIAGNOSIS — R2681 Unsteadiness on feet: Secondary | ICD-10-CM

## 2016-05-19 DIAGNOSIS — R279 Unspecified lack of coordination: Secondary | ICD-10-CM

## 2016-05-19 DIAGNOSIS — M6281 Muscle weakness (generalized): Secondary | ICD-10-CM | POA: Diagnosis not present

## 2016-05-19 NOTE — Therapy (Signed)
[place] [facility] MAIN [facility] SERVICES [address] [place], [place], [address] Phone: [phone]   Fax:  [phone]  Physical Therapy Treatment  Patient Details  Name: SEVA CHANCY MRN: [id number] Date of Birth: 16-Feb-1971 Referring Provider: Dr Leone Brand (neurologist), Dr. Marcello Fennel (referring provide)  Encounter Date: 05/19/2016      PT End of Session - 05/19/16 1503    Visit Number 7   Number of Visits 17   Date for PT Re-Evaluation 06/01/16   Authorization Type G code 7   Authorization Time Period 10   PT Start Time 1318   PT Stop Time 1400   PT Time Calculation (min) 42 min   Equipment Utilized During Treatment Gait belt  slide board   Activity Tolerance Patient tolerated treatment well   Behavior During Therapy [facility] for tasks assessed/performed      Past Medical History:  Diagnosis Date  . Abdominal wall hernia 09/05/2014  . 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 Edema leg 09/05/2014  . 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 MS (multiple sclerosis) (HCC) 2005  . 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 Neurogenic bowel 08/12/2012  . Neuropathic pain 05/21/2015    Past Surgical History:  Procedure Laterality Date  . ABDOMINAL HYSTERECTOMY  2001  . BACK SURGERY  2004  . REVISION UROSTOMY CUTANEOUS  06/2012    There were no vitals filed for this visit.      Subjective Assessment - 05/19/16 1458    Subjective Patient reports she has been doing well with no changes. She states she does UE weights at home in order to increase her strength.    Pertinent History Pertinent Hx for Therapy: progressive diagnosis, older mother is primary care giver, limited transportation    Limitations Sitting;Standing;Walking   How long can you sit comfortably? few hours    How long can you stand comfortably? unable   How long can you walk comfortably? unable   Diagnostic tests MRI in next couple  of months   Patient Stated Goals stand walk, get in and out    Currently in Pain? No/denies      Treatment:  Pre-activities: sliding board transfer to the R with minA to block BLE and increase pushing ability.  Today she required no verbal cues for RUE positioning.  Seated balance activities: CGA to close supervision to maintain balance during seated activities. No losses of balance during activity, increased sway. Tricep pushes on yoga blocks with lower body scooting, 3x2, moving body to L/R after pushing up on blocks. MinA for blocking BLE.  BUE ball catches/tosses, 2x10 with increased difficulty, CGA throughout, niece instructed to throw the ball outside patient's BOS in order to increase difficulty. Russian twists, x10 with 4.4# weighted ball, CGA, min VCs about how to perform the exercise. Seated overhead press with weighted ball, 4.4#, CGA, min VCs to continue breathing during exercise.  Push/pull with 1.5# weighted bar with manual resistance from SPT, 2x10, close supervision, min VCs on when to perform breathing.  LAQ with towel slides, x8 each LE, minA to begin activity and move through a further range, able to perform in limited range after getting started.   Bed mobility training: Instructed to move from propped on R elbow to sidelying as SPT performed min-modA to lift BLE into sidelying then supine position. Instructed to move from sidelying to seated; requires maxA to move BUE/LE in order to move into a seated position.  Rolling  to L, x8 Patient attempted to perform without contralateral LE in hooklying. She was unable to do so. Once SPT put contralateral leg in this position she was able to perform with minA to CGA.  Patient independent in moving back into supine with min VCs to bring contralateral LE over. Patient instructed to attempt to use core strength and momentum to move into sidelying and supine instead of pulling on edge of bed to decrease stress placed on RUE.   SPT instructed patient to perform AAROM on her own LE with sheet in order to increase RLE knee flexion independently before rolling, minA first set, CGA second set, Patient instructed not to perform this at home yet due to extreme difficulty, breath holding, and grimacing to perform the activity.                            PT Education - 05/19/16 1503    Education provided Yes   Education Details bed mobility, transfers, core exercises   Person(s) Educated Patient;Other (comment)   Methods Explanation;Verbal cues;Tactile cues   Comprehension Verbalized understanding;Tactile cues required;Verbal cues required             PT Long Term Goals - 04/06/16 1610      PT LONG TERM GOAL #1   Title Patient will be independent in HEP in order to increase patient's ability to maintain gains achieved in therapy and assist with return to PLOF.    Time 8   Period Weeks   Status New     PT LONG TERM GOAL #2   Title Patient will increase overall strength to 4+/5 in BUE and 3+/5 B hip/knee extension in order to complete transfers safely with decreased risk for falls.    Time 8   Period Weeks   Status New     PT LONG TERM GOAL #3   Title Patient will demonstrate ability to stand for 3 minutes with least restrictive device in order to allow for pressure relief and partcipation in ADLs.   Time 8   Period Weeks   Status New     PT LONG TERM GOAL #4   Title Patient will be able to walk 10 steps with least restrictive device with min assist in order to walk from w/c to bathroom safely   Time 8   Period Weeks   Status New     PT LONG TERM GOAL #5   Title Patient will perform stand pivot transfers with least restrictive device and mod assist in order to decrease caregiver burden and increase participation in ADLs.    Time 8   Period Weeks   Status New     Additional Long Term Goals   Additional Long Term Goals Yes     PT LONG TERM GOAL #6   Title Patient will  demonstrate ability to perform bed mobility rolling to bilateral directions in order to improve ability to pressure relief.    Time 8   Period Weeks   Status New               Plan - 05/19/16 1504    Clinical Impression Statement Patient was accompanied by her niece today for therapy. Patient demonstrates improved core strength with decreased losses of balance during core strengthening exercises. During transfers, she continues to require minA to block BLEs and requires decreased verbal cues. She demonstrates appropriate pre-positioning and hand placement during transfer. Her sequencing in bed mobility  has improved with minA required to place LE in hooklying positioning. She continues to require modA during transfers from sidelying to seated. Patient would continue to benefit from skilled PT in order to improve ability to participate in functional activities, core strengthening, LE strengthening, and safety.    Rehab Potential Fair   Clinical Impairments Affecting Rehab Potential Positive Factors: young, motivated Negative Factors: progressive disease, little-no LE strength, co-morbidities Clinical Presentation: evolving-progressive nature of the disease, extreme LE weakness   PT Frequency 2x / week   PT Duration 8 weeks   PT Treatment/Interventions ADLs/Self Care Home Management;Biofeedback;Electrical Stimulation;Cryotherapy;Ultrasound;DME Instruction;Gait training;Functional mobility training;Therapeutic activities;Therapeutic exercise;Balance training;Neuromuscular re-education;Patient/family education;Energy conservation;Passive range of motion;Wheelchair mobility training;Manual techniques   PT Next Visit Plan bed mobility, sidelying<>seated, strengthening, supported standing, seated posture   PT Home Exercise Plan continue as given;    Consulted and Agree with Plan of Care Patient      Patient will benefit from skilled therapeutic intervention in order to improve the following  deficits and impairments:  Decreased activity tolerance, Decreased balance, Decreased coordination, Decreased endurance, Decreased mobility, Decreased safety awareness, Increased edema, Hypomobility, Decreased strength, Abnormal gait, Impaired flexibility, Impaired sensation, Impaired tone, Pain, Obesity, Postural dysfunction, Improper body mechanics  Visit Diagnosis: Muscle weakness (generalized)  Unspecified lack of coordination  Unsteadiness on feet     Problem List Patient Active Problem List   Diagnosis Date Noted  . Sepsis (HCC) 01/12/2016  . Complicated UTI (urinary tract infection) 08/09/2015  . Exacerbation of multiple sclerosis (HCC) 08/09/2015  . 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 Multiple sclerosis (HCC)   . Neuropathic pain 05/21/2015  . 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 History of spinal surgery 04/29/2015  . Edema leg 09/05/2014  . Adiposity 09/05/2014  . Abdominal wall hernia 09/05/2014  . 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 Neurogenic bowel 08/12/2012  . Infection of urinary tract 04/07/2006  . 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 I have personally read, edited and approve of the note as written.  Trotter,Margaret  PT, DPT 05/19/2016, 3:50 PM  [place] [facility] MAIN [facility] SERVICES [address] [place], [place], [address] Phone: [phone]   Fax:  [phone]  Name: Suann LarryBernetta L Brunson MRN: [id number] Date of Birth: 02/05/1971

## 2016-05-24 ENCOUNTER — Ambulatory Visit: Payer: Medicare Other | Admitting: Physical Therapy

## 2016-05-26 ENCOUNTER — Ambulatory Visit: Payer: Medicare Other | Admitting: Physical Therapy

## 2016-06-04 IMAGING — CR DG ABDOMEN 2V
1 series · 2 of 2 positions shown · non-contrast
Comparison: 07/21/2015

CLINICAL DATA: Acute right abdominal pain. History bowel
obstruction.

EXAM:
ABDOMEN - 2 VIEW

[Series 1: x abdomen erect · 0.14mm/px · 2 of 2 slices shown]
[im 1/2]
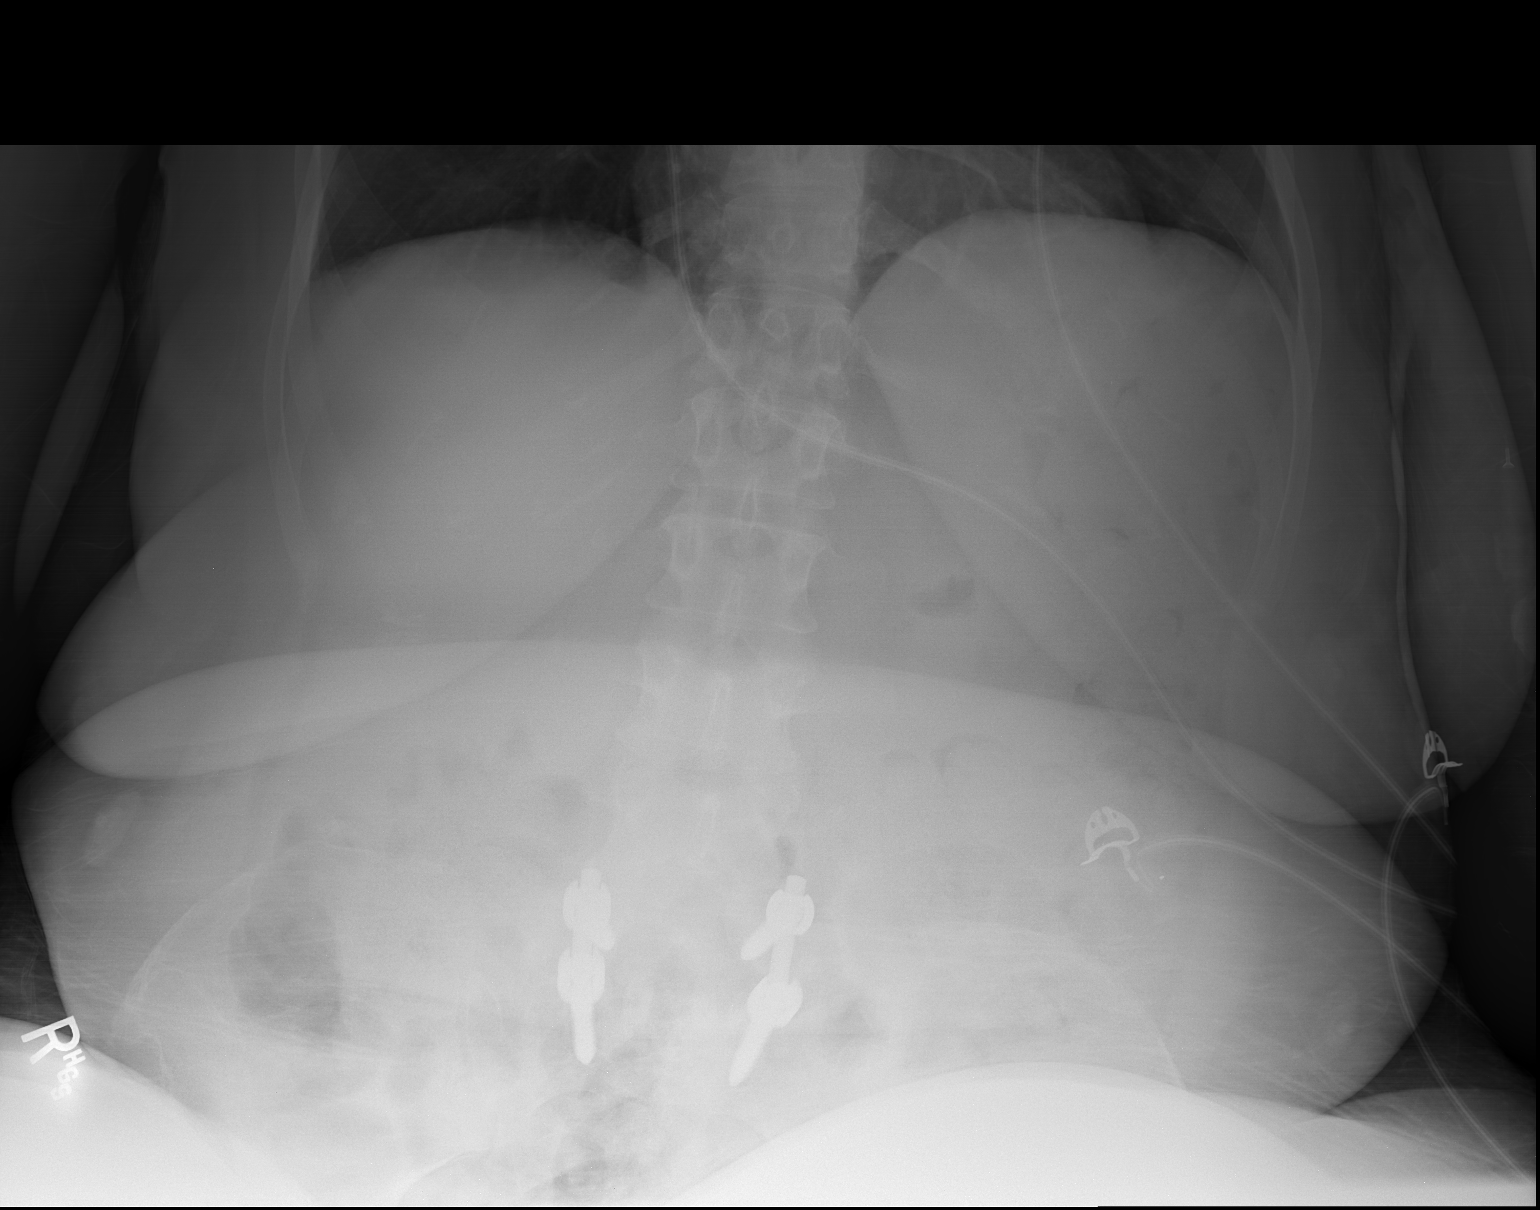
[im 2/2]
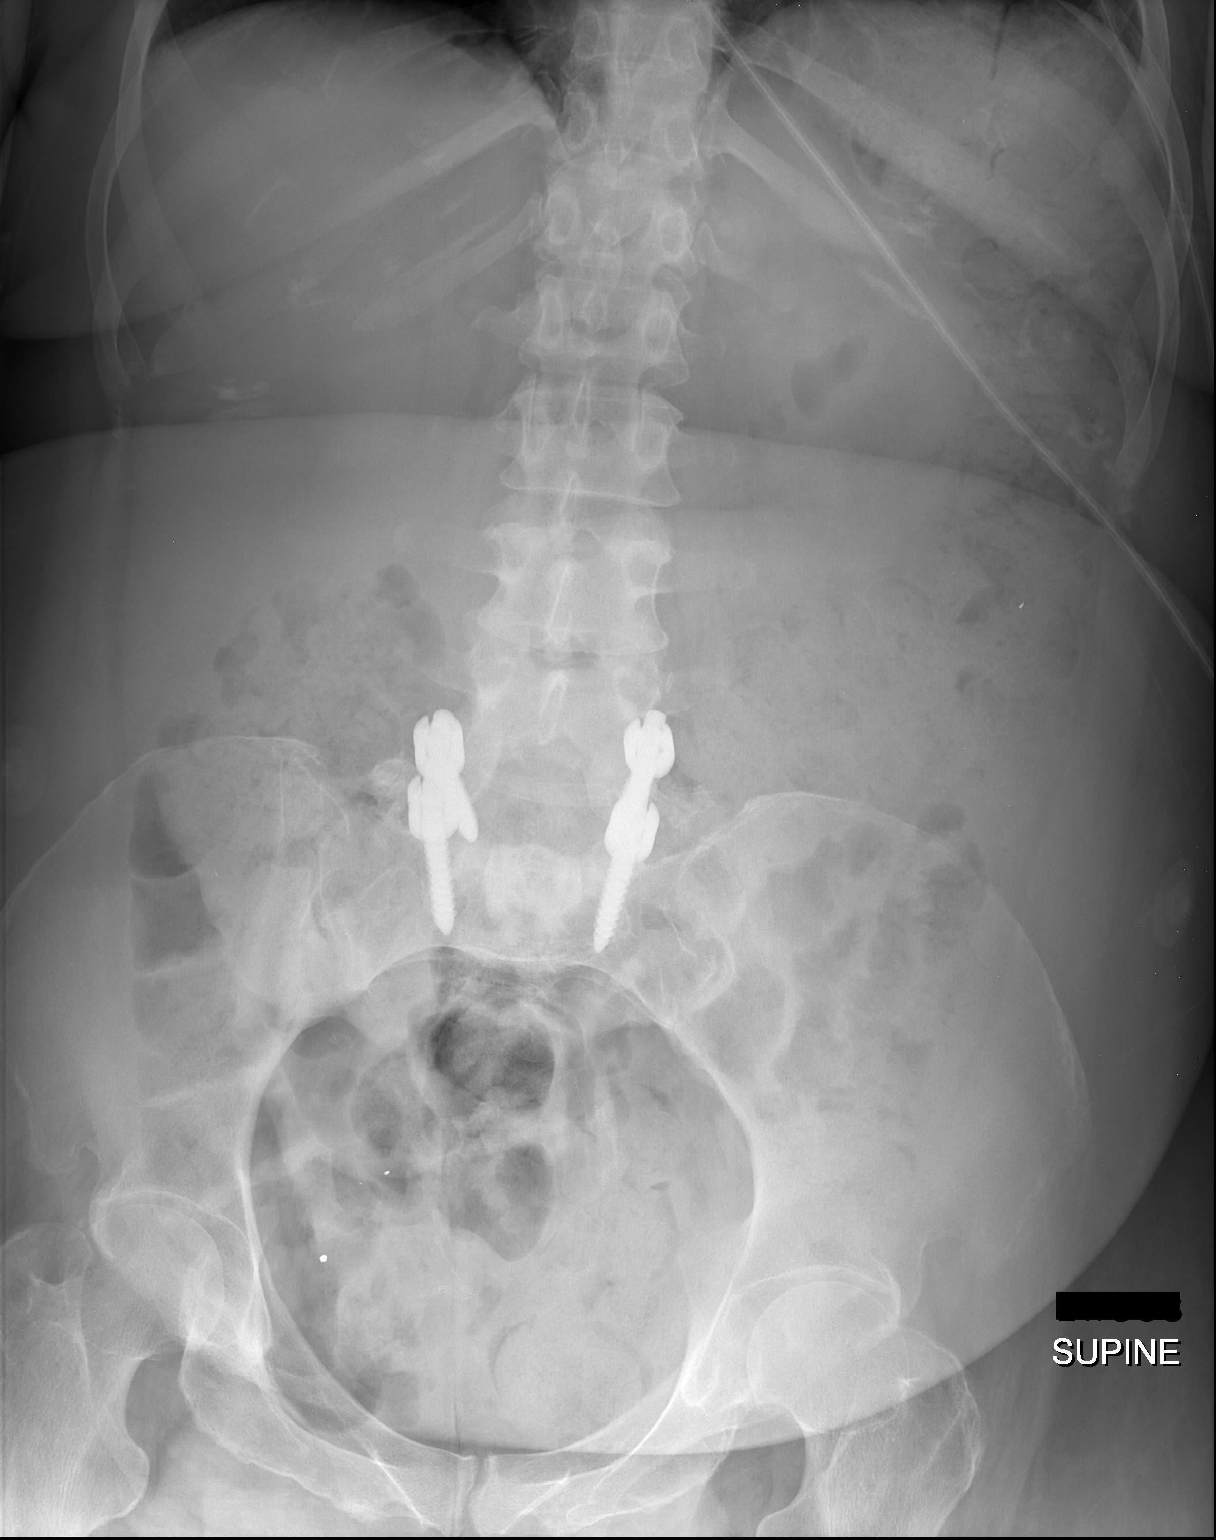

[2 of 2 positions shown; findings below may reference images not displayed]

FINDINGS: clear lung bases. No free air evident. Scattered air and stool
throughout the bowel. Moderate retained stool throughout the colon.
No definite obstruction pattern or ileus. Lower lumbar fusion
hardware noted. No abnormal calcifications or acute osseous finding.
IMPRESSION: Moderate retained stool throughout the colon. Negative for bowel
obstruction or free air.

## 2016-06-06 ENCOUNTER — Ambulatory Visit: Payer: Medicare Other | Admitting: Physical Therapy

## 2016-06-08 ENCOUNTER — Ambulatory Visit: Payer: Medicare Other | Admitting: Physical Therapy

## 2016-06-13 ENCOUNTER — Ambulatory Visit: Payer: Medicare Other | Admitting: Physical Therapy

## 2016-06-20 ENCOUNTER — Ambulatory Visit: Payer: Medicare Other | Attending: Internal Medicine | Admitting: Physical Therapy

## 2016-06-20 DIAGNOSIS — R279 Unspecified lack of coordination: Secondary | ICD-10-CM | POA: Diagnosis present

## 2016-06-20 DIAGNOSIS — M6281 Muscle weakness (generalized): Secondary | ICD-10-CM | POA: Diagnosis not present

## 2016-06-20 DIAGNOSIS — R2681 Unsteadiness on feet: Secondary | ICD-10-CM | POA: Diagnosis present

## 2016-06-20 NOTE — Therapy (Signed)
[place] MAIN [facility] SERVICES [address] [place], [place], [address] Phone: [phone]   Fax:  [phone]  Physical Therapy Treatment  Patient Details  Name: [name] MRN: [id number] Date of Birth: Apr 10, 1971 Referring Provider: Dr Felipe Drone (neurologist), Dr. [name] (referring provide)  Encounter Date: 06/20/2016      PT End of Session - 06/20/16 1650    Visit Number 8   Number of Visits 33   Date for PT Re-Evaluation 08/16/16   Authorization Type G code 8   Authorization Time Period 10   PT Start Time 4503   PT Stop Time 1530   PT Time Calculation (min) 45 min   Equipment Utilized During Treatment Gait belt  slide board   Activity Tolerance Patient tolerated treatment well   Behavior During Therapy [facility] for tasks assessed/performed      Past Medical History:  Diagnosis Date  . Abdominal wall hernia 09/05/2014  . 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 Edema leg 09/05/2014  . 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 MS (multiple sclerosis) ([place]) 2005  . Multiple sclerosis, primary progressive ([place]) 01/09/2013  . Neurogenic bowel 08/12/2012  . Neuropathic pain 05/21/2015    Past Surgical History:  Procedure Laterality Date  . ABDOMINAL HYSTERECTOMY  2001  . BACK SURGERY  2004  . REVISION UROSTOMY CUTANEOUS  06/2012    There were no vitals filed for this visit.      Subjective Assessment - 06/20/16 1648    Subjective Patient reports she has been doing well with no changes. She states she hasn't been able to make it the last few sessions due to transportation. Pt denies pain today.   Pertinent History Pertinent Hx for Therapy: progressive diagnosis, older mother is primary care giver, limited transportation    Limitations Sitting;Standing;Walking   How long can you sit comfortably? few hours    How long can you stand comfortably? unable   How long can you walk comfortably? unable   Diagnostic tests MRI in next couple of months   Patient Stated Goals stand walk, get in and out    Currently in Pain? No/denies     Treatment:  UE MMT not tested; LLE hip flex/ext is 2/5 with an inability to perform full range without gravity.  Pre/post-activities: Sliding board transfer to R/L with min-modA to block BLE and increase pushing. Min VCs for hand/feet positioning  Seated core work (previous to [employer]): CGA-close supervision during seated core work for safety.  Seated overhead press with weighted bar, 1x5#, 3x8, minA x1 due to loss of balance Partial sit ups from wedge, 3x5, min VCs to not hold breath during performance Scapular retractions with yellow tband (non-latex), 3x8, min VCs to decrease breath hold and for initial set up.  Push/pull with 1.5# weighted bar with manual resistance from SPT, 3x10, min VCs to regain balance if losing it, minA x3 for loss of balance post.   LAQ through very minimal range with towel under foot to improve slide, x5 each LE, CGA during performance, minA for increased range, min VCs to decrease breath holds [place] in hooklying, 3x5, min VCs for initial set up of exercise and to count out loud to decrease breath holds; unable to perform through range but has B glut contraction. Sidelying hip flex/ext; 3x5 with minA for increased range from SPT, easier on LLE; min VCs to increase range and decrease breath holds.   After TherEx: Bed mobility to the  L and R x1, MinA for BLE positioning, pt instructed to perform movement of contralateral LE into hooklying before rolling. Mod-MaxA to move BLE and upper extremity in order to perform lateral bed mobility to improve distance on mat table before performing bed mobility.                             PT Education - 06/20/16 1649    Education provided Yes   Education Details bed mobility, counting with exercise to decrease breath holds   Person(s) Educated Patient   Methods  Explanation   Comprehension Verbalized understanding             PT Long Term Goals - 06/20/16 1652      PT LONG TERM GOAL #1   Title Patient will be independent in HEP in order to increase patient's ability to maintain gains achieved in therapy and assist with return to PLOF.    Time 8   Period Weeks   Status On-going     PT LONG TERM GOAL #2   Title Patient will increase overall strength to 4+/5 in BUE and 3+/5 B hip/knee extension in order to complete transfers safely with decreased risk for falls.    Time 8   Period Weeks   Status Partially Met     PT LONG TERM GOAL #3   Title Patient will demonstrate ability to stand for 3 minutes with least restrictive device in order to allow for pressure relief and partcipation in ADLs.   Time 8   Period Weeks   Status On-going     PT LONG TERM GOAL #4   Title Patient will be able to walk 10 steps with least restrictive device with min assist in order to walk from w/c to bathroom safely   Time 8   Period Weeks   Status On-going     PT LONG TERM GOAL #5   Title Patient will perform stand pivot transfers with least restrictive device and mod assist in order to decrease caregiver burden and increase participation in ADLs.    Time 8   Period Weeks   Status On-going     PT LONG TERM GOAL #6   Title Patient will demonstrate ability to perform bed mobility rolling to bilateral directions in order to improve ability to pressure relief.    Time 8   Period Weeks   Status Partially Met               Plan - 06/20/16 1654    Clinical Impression Statement Pt's goals were assessed. The pt has made some gains in bed mobility and independence in HEP but otherwise stays generally the same. This is due to the patient's poor transportation schedule which causes her to miss many of her appointments. The pt will see more gains with consistent appointments since she is not able to perform a lot of activities at home due to dependence level.  The pt requires modA during sit to supine and maxA during supine to sit. She requires minA-modA for to move BLE into appropriate positioning. Pt would continue to benefit from skilled PT in order to address functional independence, core strengthening, and LE strengthening.    Rehab Potential Fair   Clinical Impairments Affecting Rehab Potential Positive Factors: young, motivated Negative Factors: progressive disease, little-no LE strength, co-morbidities Clinical Presentation: evolving-progressive nature of the disease, extreme LE weakness   PT Frequency 2x / week  PT Duration 8 weeks   PT Treatment/Interventions ADLs/Self Care Home Management;Biofeedback;Electrical Stimulation;Cryotherapy;Ultrasound;DME Instruction;Gait training;Functional mobility training;Therapeutic activities;Therapeutic exercise;Balance training;Neuromuscular re-education;Patient/family education;Energy conservation;Passive range of motion;Wheelchair mobility training;Manual techniques   PT Next Visit Plan bed mobility, sidelying<>seated, strengthening, supported standing, seated posture   PT Home Exercise Plan continue as given;    Consulted and Agree with Plan of Care Patient      Patient will benefit from skilled therapeutic intervention in order to improve the following deficits and impairments:  Decreased activity tolerance, Decreased balance, Decreased coordination, Decreased endurance, Decreased mobility, Decreased safety awareness, Increased edema, Hypomobility, Decreased strength, Abnormal gait, Impaired flexibility, Impaired sensation, Impaired tone, Pain, Obesity, Postural dysfunction, Improper body mechanics  Visit Diagnosis: Muscle weakness (generalized) - Plan: PT plan of care cert/re-cert  Unspecified lack of coordination - Plan: PT plan of care cert/re-cert  Unsteadiness on feet - Plan: PT plan of care cert/re-cert     Problem List Patient Active Problem List   Diagnosis Date Noted  . Sepsis ([place])  01/12/2016  . Complicated UTI (urinary tract infection) 08/09/2015  . Exacerbation of multiple sclerosis ([place]) 08/09/2015  . 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 Multiple sclerosis ([place])   . Neuropathic pain 05/21/2015  . 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 History of spinal surgery 04/29/2015  . Edema leg 09/05/2014  . Adiposity 09/05/2014  . Abdominal wall hernia 09/05/2014  . Multiple sclerosis, primary progressive ([place]) 01/09/2013  . Neurogenic bowel 08/12/2012  . Infection of urinary tract 04/07/2006  . 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 I have personally read, edited and approve of the note as written.  Trotter,Margaret PT, DPT 06/21/2016, 8:30 AM  [place] MAIN [facility] SERVICES [address] [place], [place], [address] Phone: [phone]   Fax:  [phone]  Name: DANIALLE DEMENT MRN: [id number] Date of Birth: Nov 01, 1970

## 2016-06-22 ENCOUNTER — Ambulatory Visit: Payer: Medicare Other | Admitting: Physical Therapy

## 2016-06-22 ENCOUNTER — Encounter: Payer: Self-pay | Admitting: Physical Therapy

## 2016-06-22 DIAGNOSIS — R2681 Unsteadiness on feet: Secondary | ICD-10-CM

## 2016-06-22 DIAGNOSIS — M6281 Muscle weakness (generalized): Secondary | ICD-10-CM

## 2016-06-22 DIAGNOSIS — R279 Unspecified lack of coordination: Secondary | ICD-10-CM

## 2016-06-23 NOTE — Therapy (Signed)
[place] MAIN [facility] SERVICES [address] [place], [place], [address] Phone: [phone]   Fax:  [phone]  Physical Therapy Treatment  Patient Details  Name: [name] MRN: [id number] Date of Birth: Jun 27, 1971 Referring Provider: Dr Felipe Drone (neurologist), Dr. [name] (referring provide)  Encounter Date: 06/22/2016      PT End of Session - 06/22/16 1725    Visit Number 9   Number of Visits 33   Date for PT Re-Evaluation 08/16/16   Authorization Type G code 9   Authorization Time Period 10   PT Start Time 1431   PT Stop Time 1517   PT Time Calculation (min) 46 min   Equipment Utilized During Treatment Gait belt  slide board   Activity Tolerance Patient tolerated treatment well   Behavior During Therapy WFL for tasks assessed/performed      Past Medical History:  Diagnosis Date  . Abdominal wall hernia 09/05/2014  . 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 Edema leg 09/05/2014  . 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 MS (multiple sclerosis) ([place]) 2005  . Multiple sclerosis, primary progressive ([place]) 01/09/2013  . Neurogenic bowel 08/12/2012  . Neuropathic pain 05/21/2015    Past Surgical History:  Procedure Laterality Date  . ABDOMINAL HYSTERECTOMY  2001  . BACK SURGERY  2004  . REVISION UROSTOMY CUTANEOUS  06/2012    There were no vitals filed for this visit.      Subjective Assessment - 06/22/16 1722    Subjective Patient reports she has been doing well and continues to do UE exercises. She states her aide has been approved, but she does not know when the aide will start.   Pertinent History Pertinent Hx for Therapy: progressive diagnosis, older mother is primary care giver, limited transportation    Limitations Sitting;Standing;Walking   How long can you sit comfortably? few hours    How long can you stand comfortably? unable   How long can you walk comfortably? unable   Diagnostic tests MRI in next couple of months   Patient Stated Goals stand walk, get in and out    Currently in Pain? Yes   Pain Score 4    Pain Location Foot   Pain Orientation Right      Treatment:  Seated in w/c: BLE extension against pball on wall; maxA for initial BLE positioning, minA to maintain BLE during activity, min VCs to push down and out and decrease breath holds by counting out loud, 3x15; PT behind w/c for safety; Limited range LAQ, towel under feet to decrease resistance, able to perform extension, minA to perform knee flexion and for RLE; multiple encouragements to push Gravity dependent ankle pumps; 2x10 min VCs for continued movement  Sliding board transfer<>mat with long sliding board, x2; minA from SPT for board placement and transfer by blocking BLE and assisting with lift; min VCs to increase weight shift so board is positioned further under her bottom.  Seated at edge of mat: Core Exercises: Ball catches, x20; minA from SPT for stability when losing balance prn Scapular retractions with yellow tband (non-latex); 3x10, min VCs to maintain upright posture throughout exercises and to perform with BUE closer to body Pball lateral roll outs, 2x5 each side, min VCs for initial set up and CGA during activity patient notes feeling it in her core and shoulders.  Seated "sit to stand" push ups, 3x5; BUE on yoga blocks, SPT assisting with lift and blocking BLE; min  VCs to push with BLEs not just BUEs Gravity independent ankle pumps; 2x10, min VCs for different between gravity dependent and non-dependent; instructed to perform at home in non-gravity dependent position and to watch socks to indicate whether feet are moving.                           PT Education - 06/22/16 1724    Education provided Yes   Education Details performing ankle pumps when lying in bed   Person(s) Educated Patient   Methods Explanation   Comprehension Verbalized  understanding             PT Long Term Goals - 06/20/16 1652      PT LONG TERM GOAL #1   Title Patient will be independent in HEP in order to increase patient's ability to maintain gains achieved in therapy and assist with return to PLOF.    Time 8   Period Weeks   Status On-going     PT LONG TERM GOAL #2   Title Patient will increase overall strength to 4+/5 in BUE and 3+/5 B hip/knee extension in order to complete transfers safely with decreased risk for falls.    Time 8   Period Weeks   Status Partially Met     PT LONG TERM GOAL #3   Title Patient will demonstrate ability to stand for 3 minutes with least restrictive device in order to allow for pressure relief and partcipation in ADLs.   Time 8   Period Weeks   Status On-going     PT LONG TERM GOAL #4   Title Patient will be able to walk 10 steps with least restrictive device with min assist in order to walk from w/c to bathroom safely   Time 8   Period Weeks   Status On-going     PT LONG TERM GOAL #5   Title Patient will perform stand pivot transfers with least restrictive device and mod assist in order to decrease caregiver burden and increase participation in ADLs.    Time 8   Period Weeks   Status On-going     PT LONG TERM GOAL #6   Title Patient will demonstrate ability to perform bed mobility rolling to bilateral directions in order to improve ability to pressure relief.    Time 8   Period Weeks   Status Partially Met               Plan - 06/23/16 8299    Clinical Impression Statement Pt is able to participate in therapy well requiring min-modA during activities for body positioning or to maintain safety. The pt continues to demonstrate poor BLE strength (R>L). She is able to perform LE extension to some extent but is unable to perform LE flexion exercises unless completely gravity independent and mild assistance. Instructed patient to being performing ankle pumps while in a gravity independent  position. Pt continues to require minA during sliding board transfers but is able to involve her BLE not just UEs in the transfer. The patient would continue to benefit from skilled PT in order to address BLE and core weakness and improve her ability to perform functional activities.    Rehab Potential Fair   Clinical Impairments Affecting Rehab Potential Positive Factors: young, motivated Negative Factors: progressive disease, little-no LE strength, co-morbidities Clinical Presentation: evolving-progressive nature of the disease, extreme LE weakness   PT Frequency 2x / week   PT Duration 8  weeks   PT Treatment/Interventions ADLs/Self Care Home Management;Biofeedback;Electrical Stimulation;Cryotherapy;Ultrasound;DME Instruction;Gait training;Functional mobility training;Therapeutic activities;Therapeutic exercise;Balance training;Neuromuscular re-education;Patient/family education;Energy conservation;Passive range of motion;Wheelchair mobility training;Manual techniques   PT Next Visit Plan bed mobility, sidelying<>seated, strengthening, supported standing, seated posture   PT Home Exercise Plan continue as given;    Consulted and Agree with Plan of Care Patient      Patient will benefit from skilled therapeutic intervention in order to improve the following deficits and impairments:  Decreased activity tolerance, Decreased balance, Decreased coordination, Decreased endurance, Decreased mobility, Decreased safety awareness, Increased edema, Hypomobility, Decreased strength, Abnormal gait, Impaired flexibility, Impaired sensation, Impaired tone, Pain, Obesity, Postural dysfunction, Improper body mechanics  Visit Diagnosis: Muscle weakness (generalized)  Unspecified lack of coordination  Unsteadiness on feet     Problem List Patient Active Problem List   Diagnosis Date Noted  . Sepsis ([place]) 01/12/2016  . Complicated UTI (urinary tract infection) 08/09/2015  . Exacerbation of multiple  sclerosis ([place]) 08/09/2015  . 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 Multiple sclerosis ([place])   . Neuropathic pain 05/21/2015  . 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 History of spinal surgery 04/29/2015  . Edema leg 09/05/2014  . Adiposity 09/05/2014  . Abdominal wall hernia 09/05/2014  . Multiple sclerosis, primary progressive ([place]) 01/09/2013  . Neurogenic bowel 08/12/2012  . Infection of urinary tract 04/07/2006  . 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 I have personally read, edited and approve of the note as written.  Trotter,Margaret PT, DPT 06/23/2016, 9:33 AM  [place] MAIN [facility] SERVICES [address] [place], [place], [address] Phone: [phone]   Fax:  [phone]  Name: [name] MRN: [id number] Date of Birth: 1970/10/22

## 2016-06-27 ENCOUNTER — Encounter: Payer: Self-pay | Admitting: Physical Therapy

## 2016-06-27 ENCOUNTER — Ambulatory Visit: Payer: Medicare Other | Attending: Internal Medicine | Admitting: Physical Therapy

## 2016-06-27 DIAGNOSIS — R2681 Unsteadiness on feet: Secondary | ICD-10-CM | POA: Diagnosis present

## 2016-06-27 DIAGNOSIS — M6281 Muscle weakness (generalized): Secondary | ICD-10-CM | POA: Insufficient documentation

## 2016-06-27 DIAGNOSIS — R279 Unspecified lack of coordination: Secondary | ICD-10-CM | POA: Diagnosis present

## 2016-06-28 NOTE — Therapy (Addendum)
[place] [facility] SERVICES [address] [place], [place], [address] Phone: [phone]   Fax:  [phone]  Physical Therapy Treatment  Patient Details  Name: [name] MRN: [id number] Date of Birth: [birth year] Referring Provider: Dr Leone Brand (neurologist), Dr. Marcello Fennel (referring provide)  Encounter Date: 06/27/2016      PT End of Session - 06/27/16 1557    Visit Number 10   Number of Visits 33   Date for PT Re-Evaluation 08/16/16   Authorization Type G code 10   Authorization Time Period 10   PT Start Time 1300   PT Stop Time 1344   PT Time Calculation (min) 44 min   Equipment Utilized During Treatment Gait belt  slide board   Activity Tolerance Patient tolerated treatment well   Behavior During Therapy [facility] for tasks assessed/performed      Past Medical History:  Diagnosis Date  . Abdominal wall hernia 09/05/2014  . 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 Edema leg 09/05/2014  . History of construction of external stoma of urinary system ([facility]) 04/29/2015  . MS (multiple sclerosis) ([facility]) 2005  . 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 Neurogenic bowel 08/12/2012  . Neuropathic pain 05/21/2015    Past Surgical History:  Procedure Laterality Date  . ABDOMINAL HYSTERECTOMY  2001  . BACK SURGERY  2004  . REVISION UROSTOMY CUTANEOUS  06/2012    There were no vitals filed for this visit.      Subjective Assessment - 06/27/16 1555    Subjective Patient notes she has had no changes since the last physical therapy session. She states she still does not have an aid, and she has been trying to practice rolling on her.    Pertinent History Pertinent Hx for Therapy: progressive diagnosis, older mother is primary care giver, limited transportation    Limitations Sitting;Standing;Walking   How long can you sit comfortably? few hours    How long can you stand comfortably? unable   How long can you walk  comfortably? unable   Diagnostic tests MRI in next couple of months   Patient Stated Goals stand walk, get in and out    Currently in Pain? No/denies     Treatment:  Before TherAct: BLE extension against pball on wall; maxA for initial BLE positioning, CGA-minA to maintain BLE during activity, min VCs to push down and out and decrease breath holds by counting out loud, 3x15; PT behind w/c for safety;  Seated core work: Push/pulls with 2# weighted bar and min manual resistance from SPT; 3x10 with rest in between, CGA-minA with multiple loss of balance backwards 4.4# ball overhead push ups/twists, 2x5 each direction, min VCs for initial set up; CGA-minA, patient notes this exercise is difficult Scapular retraction with yellow tband (non-latex), 2x10, min VCs to keep elbows close to body and maintain upright posture, CGA for safety  Transfer to mat with sliding board, x1; minA from SPT for board placement and transfer by blocking BLE and assisting with lift; min VCs about table height positioning that lower is easier. BLE flexion/extension in sidelying, x5 each LE, minA from SPT when moving into hip/knee flexion; completes through a limited range, min VCs to increase range  After TherEx: Sit<>sidelying; modA-maxA to move BLE forward off/on table; modA to move from sidelying to sitting; min VCs for hand placement and to assist in the motion.  Rolling each direction, minA for BLE placement, increased minA during roll towards R; min VCs to help  with BUEs.   Collene Mares sit to stand machine, once standing, sabina was slightly lowered to encourage patient to stand more independently; however patient had poor ability in glut max contraction     Instructed to perform glut squeezes while supported by [place]; 2x5, min tactile and verbal cues to correct muscle contraction, pt still has difficulty performing.  Pt transferred back to the w/c with sabina lift.                               PT Education - 06/27/16 1556    Education provided Yes   Education Details surface height during sliding board transfers   [employer]) Educated Patient   Methods Explanation;Verbal cues   Comprehension Verbalized understanding             PT Long Term Goals - 06/27/16 1559      PT LONG TERM GOAL #1   Title Patient will be independent in HEP in order to increase patient's ability to maintain gains achieved in therapy and assist with return to PLOF.    Time 8   Period Weeks   Status On-going     PT LONG TERM GOAL #2   Title Patient will increase overall strength to 4+/5 in BUE and 3+/5 B hip/knee extension in order to complete transfers safely with decreased risk for falls.    Time 8   Period Weeks   Status Partially Met     PT LONG TERM GOAL #3   Title Patient will demonstrate ability to stand for 3 minutes with least restrictive device in order to allow for pressure relief and partcipation in ADLs.   Time 8   Period Weeks   Status On-going     PT LONG TERM GOAL #4   Title Patient will be able to walk 10 steps with least restrictive device with min assist in order to walk from w/c to bathroom safely   Time 8   Period Weeks   Status On-going     PT LONG TERM GOAL #5   Title Patient will perform stand pivot transfers with least restrictive device and mod assist in order to decrease caregiver burden and increase participation in ADLs.    Time 8   Period Weeks   Status On-going     PT LONG TERM GOAL #6   Title Patient will demonstrate ability to perform bed mobility rolling to bilateral directions in order to improve ability to pressure relief.    Baseline minA   Time 8   Period Weeks   Status Partially Met               Plan - 06/27/16 1557    Clinical Impression Statement Pt is able to participate in therapy requiring min-modA during activities. Pt requires min-mod VCs for body positioning, counting out loud to decrease breath hold, and appropriate  exercise techniques. She was able to perform a sit to stand with [place] lift but shows very little glut max activation and is unable to hold her body upright except with extreme BUE assistance. Pt would continue to benefit from skilled PT in order to address functional movements and address BLE and core weakness.    Rehab Potential Fair   Clinical Impairments Affecting Rehab Potential Positive Factors: young, motivated Negative Factors: progressive disease, little-no LE strength, co-morbidities Clinical Presentation: evolving-progressive nature of the disease, extreme LE weakness   PT Frequency 2x / week  PT Duration 8 weeks   PT Treatment/Interventions ADLs/Self Care Home Management;Biofeedback;Electrical Stimulation;Cryotherapy;Ultrasound;DME Instruction;Gait training;Functional mobility training;Therapeutic activities;Therapeutic exercise;Balance training;Neuromuscular re-education;Patient/family education;Energy conservation;Passive range of motion;Wheelchair mobility training;Manual techniques   PT Next Visit Plan bed mobility, sidelying<>seated, strengthening, supported standing, seated posture   PT Home Exercise Plan continue as given;    Consulted and Agree with Plan of Care Patient      Patient will benefit from skilled therapeutic intervention in order to improve the following deficits and impairments:  Decreased activity tolerance, Decreased balance, Decreased coordination, Decreased endurance, Decreased mobility, Decreased safety awareness, Increased edema, Hypomobility, Decreased strength, Abnormal gait, Impaired flexibility, Impaired sensation, Impaired tone, Pain, Obesity, Postural dysfunction, Improper body mechanics  Visit Diagnosis: Muscle weakness (generalized)  Unspecified lack of coordination  Unsteadiness on feet       G-Codes - July 15, 2016 0924    Functional Assessment Tool Used clinical judgement, strength, mobility   Functional Limitation Mobility: Walking and moving  around   Mobility: Walking and Moving Around Current Status [phone]) At least 60 percent but less than 80 percent impaired, limited or restricted   Mobility: Walking and Moving Around Goal Status [phone]) At least 40 percent but less than 60 percent impaired, limited or restricted      Problem List Patient Active Problem List   Diagnosis Date Noted  . Sepsis ([place]) 01/12/2016  . Complicated UTI (urinary tract infection) 08/09/2015  . Exacerbation of multiple sclerosis ([place]) 08/09/2015  . 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 Multiple sclerosis ([place])   . Neuropathic pain 05/21/2015  . History of construction of external stoma of urinary system ([place]) 04/29/2015  . History of spinal surgery 04/29/2015  . Edema leg 09/05/2014  . Adiposity 09/05/2014  . Abdominal wall hernia 09/05/2014  . Multiple sclerosis, primary progressive ([place]) 01/09/2013  . Neurogenic bowel 08/12/2012  . Infection of urinary tract 04/07/2006  . 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 I have personally read, edited and approve of the note as written.  Trotter,Margaret PT, DPT Jul 15, 2016, 9:25 AM  [place] MAIN [facility] SERVICES [address] [place], [place], [address] Phone: [phone]   Fax:  [phone]  Name: [name] MRN: [id number] Date of Birth: 16-Dec-1970

## 2016-06-29 ENCOUNTER — Ambulatory Visit: Payer: Medicare Other | Admitting: Physical Therapy

## 2016-07-04 ENCOUNTER — Ambulatory Visit: Payer: Medicare Other | Admitting: Physical Therapy

## 2016-07-06 ENCOUNTER — Ambulatory Visit: Payer: Medicare Other | Admitting: Physical Therapy

## 2016-07-06 ENCOUNTER — Encounter: Payer: Self-pay | Admitting: Physical Therapy

## 2016-07-06 DIAGNOSIS — M6281 Muscle weakness (generalized): Secondary | ICD-10-CM | POA: Diagnosis not present

## 2016-07-06 DIAGNOSIS — R279 Unspecified lack of coordination: Secondary | ICD-10-CM

## 2016-07-06 DIAGNOSIS — R2681 Unsteadiness on feet: Secondary | ICD-10-CM

## 2016-07-06 NOTE — Therapy (Signed)
[place] MAIN [facility] SERVICES [address] [place], [place], [address] Phone: [phone]   Fax:  [phone]  Physical Therapy Treatment  Patient Details  Name: [name] MRN: [id number] Date of Birth: 01-02-1971 Referring Provider: Dr Felipe Drone (neurologist), Dr. [name] (referring provide)  Encounter Date: 07/06/2016      PT End of Session - 07/06/16 1346    Visit Number 11   Number of Visits 33   Date for PT Re-Evaluation 08/16/16   Authorization Type G code 1   Authorization Time Period 10   PT Start Time 1114   PT Stop Time 1201   PT Time Calculation (min) 47 min   Equipment Utilized During Treatment Gait belt  slide board   Activity Tolerance Patient tolerated treatment well   Behavior During Therapy WFL for tasks assessed/performed      Past Medical History:  Diagnosis Date  . Abdominal wall hernia 09/05/2014  . 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 Edema leg 09/05/2014  . History of construction of external stoma of urinary system ([place]) 04/29/2015  . MS (multiple sclerosis) ([place]) 2005  . Multiple sclerosis, primary progressive ([place]) 01/09/2013  . Neurogenic bowel 08/12/2012  . Neuropathic pain 05/21/2015    Past Surgical History:  Procedure Laterality Date  . ABDOMINAL HYSTERECTOMY  2001  . BACK SURGERY  2004  . REVISION UROSTOMY CUTANEOUS  06/2012    There were no vitals filed for this visit.      Subjective Assessment - 07/06/16 1110    Subjective Patient notes she has had no changes since last seen in physical therapy. She stated her aid will be starting soon.    Pertinent History Pertinent Hx for Therapy: progressive diagnosis, older mother is primary care giver, limited transportation    Limitations Sitting;Standing;Walking   How long can you sit comfortably? few hours    How long can you stand comfortably? unable   How long can you walk comfortably? unable   Diagnostic tests MRI in next couple of  months   Patient Stated Goals stand walk, get in and out    Currently in Pain? No/denies      Treatment: Before TherAct: BLE extension against pball with SPT resistance; maxA for initial BLE positioning, CGA-minA to maintain BLE during activity, min VCs to push down and out and decrease breath holds by counting out loud, x15; x20 SPT behind w/c for safety;  Seated core work: Push/pulls with 2# weighted bar and min manual resistance from SPT; 3x10 with rest in between, CGA with no loss of balance. Seated UE reaches in mutli planes x5 each UE, instructed to shoot basketball shot after reaches; CGA for safety.   Supine bridges, 3x5, min VCs to increase glut contraction during lift Supine stretching of LLE in D1 pattern, x10, min VCs for how to decrease tone with body positioning such as prolong stretching/pressure.  Transfer to mat with sliding board, x1; minA-modA from SPT for transfer by blocking BLE and assisting with lift BLE flexion/extension in sidelying, 3x5 each LE, minA to keep leg in neutral position, patient improved with decrease assist from SPT when moving into hip/knee flexion; limited range.  After TherEx: Practice sit to stands, 2x5 with BUE pushing on blocks, minA from SPT to increase lifting ability.  Sit<>sidelying; modA-maxA to move BLE forward off/on table; modA to move from sidelying to sitting; Rolling each direction, minA for BLE placement, patient able to help with BUE on edge of table.  PT Education - 07/06/16 1345    Education provided Yes   Education Details education of aide during sliding board transfers, reason for focusing on glut max strengthening   Person(s) Educated Patient   Methods Explanation   Comprehension Verbalized understanding             PT Long Term Goals - 06/27/16 1559      PT LONG TERM GOAL #1   Title Patient will be independent in HEP in order to increase patient's ability to  maintain gains achieved in therapy and assist with return to PLOF.    Time 8   Period Weeks   Status On-going     PT LONG TERM GOAL #2   Title Patient will increase overall strength to 4+/5 in BUE and 3+/5 B hip/knee extension in order to complete transfers safely with decreased risk for falls.    Time 8   Period Weeks   Status Partially Met     PT LONG TERM GOAL #3   Title Patient will demonstrate ability to stand for 3 minutes with least restrictive device in order to allow for pressure relief and partcipation in ADLs.   Time 8   Period Weeks   Status On-going     PT LONG TERM GOAL #4   Title Patient will be able to walk 10 steps with least restrictive device with min assist in order to walk from w/c to bathroom safely   Time 8   Period Weeks   Status On-going     PT LONG TERM GOAL #5   Title Patient will perform stand pivot transfers with least restrictive device and mod assist in order to decrease caregiver burden and increase participation in ADLs.    Time 8   Period Weeks   Status On-going     PT LONG TERM GOAL #6   Title Patient will demonstrate ability to perform bed mobility rolling to bilateral directions in order to improve ability to pressure relief.    Baseline minA   Time 8   Period Weeks   Status Partially Met               Plan - 07/06/16 1347    Clinical Impression Statement Pt had improved participation therapy with more control of BLE during activities. She continues to requires min VCs and min-modA for bed mobility. When performing sliding board transfers, the patient was instructed to take the opposite arm rest off and she was able to perform more of a weight shift for improved board placement. The patient would continue to benefit from skilled PT inorder to address BLE and core weakness and improver her ability to perform functional activities.    Rehab Potential Fair   Clinical Impairments Affecting Rehab Potential Positive Factors: young,  motivated Negative Factors: progressive disease, little-no LE strength, co-morbidities Clinical Presentation: evolving-progressive nature of the disease, extreme LE weakness   PT Frequency 2x / week   PT Duration 8 weeks   PT Treatment/Interventions ADLs/Self Care Home Management;Biofeedback;Electrical Stimulation;Cryotherapy;Ultrasound;DME Instruction;Gait training;Functional mobility training;Therapeutic activities;Therapeutic exercise;Balance training;Neuromuscular re-education;Patient/family education;Energy conservation;Passive range of motion;Wheelchair mobility training;Manual techniques   PT Next Visit Plan bed mobility, sidelying<>seated, strengthening, supported standing, seated posture   PT Home Exercise Plan continue as given;    Consulted and Agree with Plan of Care Patient      Patient will benefit from skilled therapeutic intervention in order to improve the following deficits and impairments:  Decreased activity tolerance, Decreased balance, Decreased coordination, Decreased endurance,  Decreased mobility, Decreased safety awareness, Increased edema, Hypomobility, Decreased strength, Abnormal gait, Impaired flexibility, Impaired sensation, Impaired tone, Pain, Obesity, Postural dysfunction, Improper body mechanics  Visit Diagnosis: Muscle weakness (generalized)  Unspecified lack of coordination  Unsteadiness on feet     Problem List Patient Active Problem List   Diagnosis Date Noted  . Sepsis ([place]) 01/12/2016  . Complicated UTI (urinary tract infection) 08/09/2015  . Exacerbation of multiple sclerosis ([place]) 08/09/2015  . 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 Multiple sclerosis ([place])   . Neuropathic pain 05/21/2015  . History of construction of external stoma of urinary system ([place]) 04/29/2015  . History of spinal surgery 04/29/2015  . Edema leg 09/05/2014  . Adiposity 09/05/2014  . Abdominal wall hernia 09/05/2014  . Multiple sclerosis, primary  progressive ([place]) 01/09/2013  . Neurogenic bowel 08/12/2012  . Infection of urinary tract 04/07/2006  . 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 I have personally read, edited and approve of the note as written.  Trotter,Margaret PT, DPT 07/06/2016, 2:17 PM  [place] MAIN [facility] SERVICES [address] [place], [place], [address] Phone: [phone]   Fax:  [phone]  Name: [name] MRN: [id number] Date of Birth: Mar 18, 1971

## 2016-07-11 ENCOUNTER — Ambulatory Visit: Payer: Medicare Other | Admitting: Physical Therapy

## 2016-07-11 ENCOUNTER — Encounter: Payer: Self-pay | Admitting: Physical Therapy

## 2016-07-11 DIAGNOSIS — M6281 Muscle weakness (generalized): Secondary | ICD-10-CM | POA: Diagnosis not present

## 2016-07-11 DIAGNOSIS — R279 Unspecified lack of coordination: Secondary | ICD-10-CM

## 2016-07-11 DIAGNOSIS — R2681 Unsteadiness on feet: Secondary | ICD-10-CM

## 2016-07-11 NOTE — Therapy (Signed)
[place] MAIN [facility] SERVICES [address] [place], [place], [address] Phone: [phone]   Fax:  [phone]  Physical Therapy Treatment  Patient Details  Name: [name] MRN: [id number] Date of Birth: 1971/08/01 Referring Provider: Dr Felipe Drone (neurologist), Dr. [name] (referring provide)  Encounter Date: 07/11/2016      PT End of Session - 07/11/16 1318    Visit Number 12   Number of Visits 33   Date for PT Re-Evaluation 08/16/16   Authorization Type G code 2   Authorization Time Period 10   PT Start Time 1118   PT Stop Time 1204   PT Time Calculation (min) 46 min   Equipment Utilized During Treatment Gait belt   Activity Tolerance Patient tolerated treatment well   Behavior During Therapy WFL for tasks assessed/performed      Past Medical History:  Diagnosis Date  . Abdominal wall hernia 09/05/2014  . 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 Edema leg 09/05/2014  . History of construction of external stoma of urinary system ([place]) 04/29/2015  . MS (multiple sclerosis) ([place]) 2005  . Multiple sclerosis, primary progressive ([place]) 01/09/2013  . Neurogenic bowel 08/12/2012  . Neuropathic pain 05/21/2015    Past Surgical History:  Procedure Laterality Date  . ABDOMINAL HYSTERECTOMY  2001  . BACK SURGERY  2004  . REVISION UROSTOMY CUTANEOUS  06/2012    There were no vitals filed for this visit.      Subjective Assessment - 07/11/16 1313    Subjective Patient notes not changes since last therapy session. She is still waiting on her aid to start. She also continues to desire instruction for how to get out of her car with less people pulling on her BUEs.   Pertinent History Pertinent Hx for Therapy: progressive diagnosis, older mother is primary care giver, limited transportation    Limitations Sitting;Standing;Walking   How long can you sit comfortably? few hours    How long can you stand comfortably? unable   How long can  you walk comfortably? unable   Diagnostic tests MRI in next couple of months   Patient Stated Goals stand walk, get in and out    Currently in Pain? No/denies     Treatment:  Before TherAct: BLE extension against pball with SPT resistance; maxA for initial BLE positioning, CGA-minA to maintain BLE during activity, min VCs to push down and out and decrease breath holds by counting out loud, 2x10, SPT behind w/c for safety; Seated in w/c, forwards/backwards (x10), and diagonal (x10) ball rolls outs, min VCs to move into upright posture during sitting and to continue breathing. Knee ext/flexion with feet on towel (x5), and dangling off ground (x5), minA to increase the range of motion; SPT seated next to patient for guarding in case of LOB. Ankle pumps in seated, feet dangling off ground, x10  Seated core work: Push/pulls with 2# weighted bar and min manual resistance from SPT; 2x10 with rest in between, CGA with no LOB. Seated military press with 2#, 2x10, instructed to maintain upright posture and breathe; CGA for safety, no LOB Seated scapular retraction against yellow tband (non-latex), 3x15, rest in between sets, min VCs to keep elbows close to body and regain balance prn; LOB x3, minA to regain balance.  After TherEx: Transfer to mat withsliding board, x1; minA-modA from SPT for transfer by blocking BLE and assisting with lift and foot placement Practice sit to stands, 2x5 with BUE pushing on mat table, minA from  SPT to increase lifting ability; min VCs to improve breathing Instructed to use sheet during car transfers for people to lift under her bottom and decrease the soreness underneath her BUEs.                          PT Education - 07/11/16 1314    Education provided Yes   Education Details car transfers, exercise techniques   Person(s) Educated Patient   Methods Explanation   Comprehension Verbalized understanding             PT Long Term Goals  - 06/27/16 1559      PT LONG TERM GOAL #1   Title Patient will be independent in HEP in order to increase patient's ability to maintain gains achieved in therapy and assist with return to PLOF.    Time 8   Period Weeks   Status On-going     PT LONG TERM GOAL #2   Title Patient will increase overall strength to 4+/5 in BUE and 3+/5 B hip/knee extension in order to complete transfers safely with decreased risk for falls.    Time 8   Period Weeks   Status Partially Met     PT LONG TERM GOAL #3   Title Patient will demonstrate ability to stand for 3 minutes with least restrictive device in order to allow for pressure relief and partcipation in ADLs.   Time 8   Period Weeks   Status On-going     PT LONG TERM GOAL #4   Title Patient will be able to walk 10 steps with least restrictive device with min assist in order to walk from w/c to bathroom safely   Time 8   Period Weeks   Status On-going     PT LONG TERM GOAL #5   Title Patient will perform stand pivot transfers with least restrictive device and mod assist in order to decrease caregiver burden and increase participation in ADLs.    Time 8   Period Weeks   Status On-going     PT LONG TERM GOAL #6   Title Patient will demonstrate ability to perform bed mobility rolling to bilateral directions in order to improve ability to pressure relief.    Baseline minA   Time 8   Period Weeks   Status Partially Met               Plan - 07/11/16 1315    Clinical Impression Statement Pt continues to demonstrate improved control of BLE during activities; however, she continues to demonstrate weak glut max muscles and poor LE movement through a range. She requires minA to decrease LOB during seated core exercises. She continues to requries min VCs to perform exercises appropriately. She requires min-modA during sliding board transfers. The patient would continue to benefit from skilled PT in order to address BLE and core weakness and  improve her ability to participate in functional activities.    Rehab Potential Fair   Clinical Impairments Affecting Rehab Potential Positive Factors: young, motivated Negative Factors: progressive disease, little-no LE strength, co-morbidities Clinical Presentation: evolving-progressive nature of the disease, extreme LE weakness   PT Frequency 2x / week   PT Duration 8 weeks   PT Treatment/Interventions ADLs/Self Care Home Management;Biofeedback;Electrical Stimulation;Cryotherapy;Ultrasound;DME Instruction;Gait training;Functional mobility training;Therapeutic activities;Therapeutic exercise;Balance training;Neuromuscular re-education;Patient/family education;Energy conservation;Passive range of motion;Wheelchair mobility training;Manual techniques   PT Next Visit Plan bed mobility, sidelying<>seated, strengthening, supported standing, seated posture   PT Home  Exercise Plan continue as given;    Consulted and Agree with Plan of Care Patient      Patient will benefit from skilled therapeutic intervention in order to improve the following deficits and impairments:  Decreased activity tolerance, Decreased balance, Decreased coordination, Decreased endurance, Decreased mobility, Decreased safety awareness, Increased edema, Hypomobility, Decreased strength, Abnormal gait, Impaired flexibility, Impaired sensation, Impaired tone, Pain, Obesity, Postural dysfunction, Improper body mechanics  Visit Diagnosis: Muscle weakness (generalized)  Unspecified lack of coordination  Unsteadiness on feet     Problem List Patient Active Problem List   Diagnosis Date Noted  . Sepsis ([place]) 01/12/2016  . Complicated UTI (urinary tract infection) 08/09/2015  . Exacerbation of multiple sclerosis ([place]) 08/09/2015  . 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 Multiple sclerosis ([place])   . Neuropathic pain 05/21/2015  . History of construction of external stoma of urinary system ([place])  04/29/2015  . History of spinal surgery 04/29/2015  . Edema leg 09/05/2014  . Adiposity 09/05/2014  . Abdominal wall hernia 09/05/2014  . Multiple sclerosis, primary progressive ([place]) 01/09/2013  . Neurogenic bowel 08/12/2012  . Infection of urinary tract 04/07/2006  . 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 I have personally read, edited and approve of the note as written.  Collie Siad PT, DPT 07/11/2016, 2:41 PM  [place] MAIN [facility] SERVICES [address] [place], [place], [address] Phone: [phone]   Fax:  [phone]  Name: [name] MRN: [id number] Date of Birth: Apr 02, 1971

## 2016-07-13 ENCOUNTER — Ambulatory Visit: Payer: Medicare Other | Admitting: Physical Therapy

## 2016-07-13 DIAGNOSIS — M6281 Muscle weakness (generalized): Secondary | ICD-10-CM

## 2016-07-13 DIAGNOSIS — R279 Unspecified lack of coordination: Secondary | ICD-10-CM

## 2016-07-13 DIAGNOSIS — R2681 Unsteadiness on feet: Secondary | ICD-10-CM

## 2016-07-13 NOTE — Therapy (Signed)
[place] MAIN [facility] SERVICES [address] [place], [place], [address] Phone: [phone]   Fax:  [phone]  Physical Therapy Treatment  Patient Details  Name: [name] MRN: [id number] Date of Birth: 01-Jun-1971 Referring Provider: Dr Felipe Drone (neurologist), Dr. [name] (referring provide)  Encounter Date: 07/13/2016      PT End of Session - 07/13/16 1520    Visit Number 13   Number of Visits 33   Date for PT Re-Evaluation 08/16/16   Authorization Type G code 3   Authorization Time Period 10   PT Start Time 1117   PT Stop Time 1201   PT Time Calculation (min) 44 min   Equipment Utilized During Treatment Gait belt   Activity Tolerance Patient tolerated treatment well   Behavior During Therapy WFL for tasks assessed/performed      Past Medical History:  Diagnosis Date  . Abdominal wall hernia 09/05/2014  . 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 Edema leg 09/05/2014  . History of construction of external stoma of urinary system ([place]) 04/29/2015  . MS (multiple sclerosis) ([place]) 2005  . Multiple sclerosis, primary progressive ([place]) 01/09/2013  . Neurogenic bowel 08/12/2012  . Neuropathic pain 05/21/2015    Past Surgical History:  Procedure Laterality Date  . ABDOMINAL HYSTERECTOMY  2001  . BACK SURGERY  2004  . REVISION UROSTOMY CUTANEOUS  06/2012    There were no vitals filed for this visit.      Subjective Assessment - 07/13/16 1116    Subjective Patient notes she had a burning sensation in legs yesterday. Notes she doesn't have this anymore.    Pertinent History Pertinent Hx for Therapy: progressive diagnosis, older mother is primary care giver, limited transportation    Limitations Sitting;Standing;Walking   How long can you sit comfortably? few hours    How long can you stand comfortably? unable   How long can you walk comfortably? unable   Diagnostic tests MRI in next couple of months   Patient Stated Goals stand  walk, get in and out    Currently in Pain? No/denies     Treatment:  Before TherAct: Seated in w/c, forwards/backwards (x10), and diagonal (x10) ball rolls outs, min VCs to move into upright posture and perform slight scapular retraction for improved postural positioning.  Seated core work: Push/pulls with 2# weighted bar and min manual resistance from SPT; 2x10 with rest in between, CGA with no LOB, min VCs to maintain core activity and regain balance prn. Seated russian twists with 4.4# weighted ball, 2x5, min VCs for initial performance; CGA for safety, no LOB In supine: SAQ over large green bolster, 3x5, minA to increase range of motion, min VCs to decrease breath holds Attempted eccentric control from extension into flexion, x2 on RLE, unable to perform, d/c Hip Add, 2x10, minA to move bodies into hip abd before moving, minA to keep patient in hooklying Bridges, 2x10, min VCs to decrease breath holding and contract glut max; minA to maintain BLE in hooklying position Patient had mild spasms, stretched BLE x10, x5 to decrease spasms in D1 pattern   After TherEx: Transfer to mat withsliding board, x1; mod-maxAfrom SPT for transfer by blocking BLE and assisting with lift and foot placement Transfer from mat to w/c, x1, minA only to block BLE for safety Practice sit to stands, x5 with BUE pushing on yoga blocks, minA from SPT to increase lifting ability; min VCs to improve breathing Lateral shifting with butt raises, x3, min VCs  for body positioning, BUE pushing on yoga blocks, CGA for safety I                          PT Education - 07/13/16 1519    Education provided Yes   Education Details continuing with UE exercises, muscle movement   Person(s) Educated Patient   Methods Explanation   Comprehension Verbalized understanding             PT Long Term Goals - 06/27/16 1559      PT LONG TERM GOAL #1   Title Patient will be independent in HEP in  order to increase patient's ability to maintain gains achieved in therapy and assist with return to PLOF.    Time 8   Period Weeks   Status On-going     PT LONG TERM GOAL #2   Title Patient will increase overall strength to 4+/5 in BUE and 3+/5 B hip/knee extension in order to complete transfers safely with decreased risk for falls.    Time 8   Period Weeks   Status Partially Met     PT LONG TERM GOAL #3   Title Patient will demonstrate ability to stand for 3 minutes with least restrictive device in order to allow for pressure relief and partcipation in ADLs.   Time 8   Period Weeks   Status On-going     PT LONG TERM GOAL #4   Title Patient will be able to walk 10 steps with least restrictive device with min assist in order to walk from w/c to bathroom safely   Time 8   Period Weeks   Status On-going     PT LONG TERM GOAL #5   Title Patient will perform stand pivot transfers with least restrictive device and mod assist in order to decrease caregiver burden and increase participation in ADLs.    Time 8   Period Weeks   Status On-going     PT LONG TERM GOAL #6   Title Patient will demonstrate ability to perform bed mobility rolling to bilateral directions in order to improve ability to pressure relief.    Baseline minA   Time 8   Period Weeks   Status Partially Met               Plan - 07/13/16 1520    Clinical Impression Statement Patient continues to demonstrate improved BLE control, but it is still within a limited range. During her transfer to the mat table, she required mod-maxA, which patient notes her arms are more tired during the cold weather. She requires min VCs during activities to improve her technique in order to address specific muscles better. She requires min-modA during bed mobility and maxA for moving her body sideways on the mat table. The patient would continue to benefit from skilled PT inorder to improver her BLE and core weakness and improve her  ability to participate in functional activities.    Rehab Potential Fair   Clinical Impairments Affecting Rehab Potential Positive Factors: young, motivated Negative Factors: progressive disease, little-no LE strength, co-morbidities Clinical Presentation: evolving-progressive nature of the disease, extreme LE weakness   PT Frequency 2x / week   PT Duration 8 weeks   PT Treatment/Interventions ADLs/Self Care Home Management;Biofeedback;Electrical Stimulation;Cryotherapy;Ultrasound;DME Instruction;Gait training;Functional mobility training;Therapeutic activities;Therapeutic exercise;Balance training;Neuromuscular re-education;Patient/family education;Energy conservation;Passive range of motion;Wheelchair mobility training;Manual techniques   PT Next Visit Plan bed mobility, sidelying<>seated, strengthening, supported standing, seated posture   PT  Home Exercise Plan continue as given;    Consulted and Agree with Plan of Care Patient      Patient will benefit from skilled therapeutic intervention in order to improve the following deficits and impairments:  Decreased activity tolerance, Decreased balance, Decreased coordination, Decreased endurance, Decreased mobility, Decreased safety awareness, Increased edema, Hypomobility, Decreased strength, Abnormal gait, Impaired flexibility, Impaired sensation, Impaired tone, Pain, Obesity, Postural dysfunction, Improper body mechanics  Visit Diagnosis: Muscle weakness (generalized)  Unspecified lack of coordination  Unsteadiness on feet     Problem List Patient Active Problem List   Diagnosis Date Noted  . Sepsis ([place]) 01/12/2016  . Complicated UTI (urinary tract infection) 08/09/2015  . Exacerbation of multiple sclerosis ([place]) 08/09/2015  . 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 Multiple sclerosis ([place])   . Neuropathic pain 05/21/2015  . History of construction of external stoma of urinary system ([place]) 04/29/2015  .  History of spinal surgery 04/29/2015  . Edema leg 09/05/2014  . Adiposity 09/05/2014  . Abdominal wall hernia 09/05/2014  . Multiple sclerosis, primary progressive ([place]) 01/09/2013  . Neurogenic bowel 08/12/2012  . Infection of urinary tract 04/07/2006  . 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 I have personally read, edited and approve of the note as written.  Collie Siad PT, DPT 07/13/2016, 5:05 PM  [place] MAIN [facility] SERVICES [address] [place], [place], [address] Phone: [phone]   Fax:  [phone]  Name: [name] MRN: [id number] Date of Birth: 1970/10/20

## 2016-07-13 NOTE — Patient Instructions (Signed)
Ball roll outs, sitting in w/c with no arm rests, min VCs to perform scapular retraction, 2x10

## 2016-07-18 ENCOUNTER — Ambulatory Visit: Payer: Medicare Other | Admitting: Physical Therapy

## 2016-07-20 ENCOUNTER — Ambulatory Visit: Payer: Medicare Other | Admitting: Physical Therapy

## 2016-07-25 ENCOUNTER — Ambulatory Visit: Payer: Medicare Other | Admitting: Physical Therapy

## 2016-07-25 ENCOUNTER — Encounter: Payer: Self-pay | Admitting: Physical Therapy

## 2016-07-25 DIAGNOSIS — R2681 Unsteadiness on feet: Secondary | ICD-10-CM

## 2016-07-25 DIAGNOSIS — M6281 Muscle weakness (generalized): Secondary | ICD-10-CM | POA: Diagnosis not present

## 2016-07-25 DIAGNOSIS — R279 Unspecified lack of coordination: Secondary | ICD-10-CM

## 2016-07-25 NOTE — Therapy (Signed)
[place] MAIN [facility] SERVICES [address] [place], [place], [address] Phone: [phone]   Fax:  [phone]  Physical Therapy Treatment  Patient Details  Name: [name] MRN: [id number] Date of Birth: 07/04/1971 Referring Provider: Dr Felipe Drone (neurologist), Dr. [name] (referring provide)  Encounter Date: 07/25/2016      PT End of Session - 07/25/16 1119    Visit Number 14   Number of Visits 33   Date for PT Re-Evaluation 08/16/16   Authorization Type G code 4   Authorization Time Period 10   PT Start Time 1116   PT Stop Time 1206   PT Time Calculation (min) 50 min   Equipment Utilized During Treatment Gait belt   Activity Tolerance Patient tolerated treatment well   Behavior During Therapy [facility] for tasks assessed/performed      Past Medical History:  Diagnosis Date  . Abdominal wall hernia 09/05/2014  . 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 Edema leg 09/05/2014  . History of construction of external stoma of urinary system ([place]) 04/29/2015  . MS (multiple sclerosis) ([place]) 2005  . Multiple sclerosis, primary progressive ([place]) 01/09/2013  . Neurogenic bowel 08/12/2012  . Neuropathic pain 05/21/2015    Past Surgical History:  Procedure Laterality Date  . ABDOMINAL HYSTERECTOMY  2001  . BACK SURGERY  2004  . REVISION UROSTOMY CUTANEOUS  06/2012    There were no vitals filed for this visit.      Subjective Assessment - 07/25/16 1119    Subjective Patient notes the cold makes her stiff and that she is slightly store today. She notes that everything else is going well.    Pertinent History Pertinent Hx for Therapy: progressive diagnosis, older mother is primary care giver, limited transportation    Limitations Sitting;Standing;Walking   How long can you sit comfortably? few hours    How long can you stand comfortably? unable   How long can you walk comfortably? unable   Diagnostic tests MRI in next couple of months   Patient Stated Goals stand walk, get in and out    Currently in Pain? Yes   Pain Score 2    Pain Location Leg   Pain Orientation Right;Left   Pain Descriptors / Indicators Aching;Sore   Pain Type Chronic pain     Treatment: Before TherAct:  Seated core work to address balance: Scapular retractions, red tband resistance, CGA for safety, min VCs to maintain upright posture and not lean against band, 2x10, min VCs to decrease upper trap activation and improve scapular retraction Seated BUE presses with 4.4# weighted ball, 3x5, CGA for safety, min VCs to maintain upright posture Seated scoots with BUE on yoga blocks, x5, modA to block LE and increase lifting, patient has difficulty compared to other sessions.  In supine: SAQ over large green bolster, 2x5, min VCs to decrease breath holds, easier on LLE than RLE Bridges, 2x10, min VCs to decrease breath holding and contract glut max to full ability; minA to maintain BLE in hooklying position Patient had mild spasms, stretched in D1 pattern to BLE x5 each LE to decrease spasms  Sidelying hip flex/ext, 2x6, minA to maintain plane, min VCs to rest in between sets and importance of allowing muscles to rest Seated ankle DF/PF, x10 each LE, min VCs to watch socks crinkle and flatten in order to see movement, greater difficulty moving into DF  After TherEx: Rolling each direction, minA to continue hips moving forward, modA with BLE and upper  body to perform lateral movement on mat table Transfer to mat withsliding board, x2; mod-maxAfrom SPT for transfer by blocking BLE and assisting with lift and foot placement; maxA initially due to patient being cold and legs not warm. Transfer from mat to w/c, x3, minA only to block BLE for safety, min VCs for hand placement                               PT Education - 07/25/16 1119    Education provided Yes   Education Details sliding board transfers   [employer]) Educated  Patient;Caregiver(s)   Methods Explanation   Comprehension Verbalized understanding             PT Long Term Goals - 06/27/16 1559      PT LONG TERM GOAL #1   Title Patient will be independent in HEP in order to increase patient's ability to maintain gains achieved in therapy and assist with return to PLOF.    Time 8   Period Weeks   Status On-going     PT LONG TERM GOAL #2   Title Patient will increase overall strength to 4+/5 in BUE and 3+/5 B hip/knee extension in order to complete transfers safely with decreased risk for falls.    Time 8   Period Weeks   Status Partially Met     PT LONG TERM GOAL #3   Title Patient will demonstrate ability to stand for 3 minutes with least restrictive device in order to allow for pressure relief and partcipation in ADLs.   Time 8   Period Weeks   Status On-going     PT LONG TERM GOAL #4   Title Patient will be able to walk 10 steps with least restrictive device with min assist in order to walk from w/c to bathroom safely   Time 8   Period Weeks   Status On-going     PT LONG TERM GOAL #5   Title Patient will perform stand pivot transfers with least restrictive device and mod assist in order to decrease caregiver burden and increase participation in ADLs.    Time 8   Period Weeks   Status On-going     PT LONG TERM GOAL #6   Title Patient will demonstrate ability to perform bed mobility rolling to bilateral directions in order to improve ability to pressure relief.    Baseline minA   Time 8   Period Weeks   Status Partially Met               Plan - 07/25/16 1256    Clinical Impression Statement Pt took the first 8 min. to use the bathroom with her aid so not billed for therapy during this time. Pt has increased BLE tone today. She requires some of a warm up before she is able to participate in therapy better. She continues to require min VCs for increase performance of activities and to continue breathing during  activities. She requires mod-maxA for initial sliding board transfer and decrease to modA during the successive transfers. She has improvement when she is able to push off of w/c arm. Patient would continue to benefit from a few more sessions in order to improve transfer safety and train aid in transfers and to increased BLE strength.    Rehab Potential Fair   Clinical Impairments Affecting Rehab Potential Positive Factors: young, motivated Negative Factors: progressive disease, little-no LE strength, co-morbidities  Clinical Presentation: evolving-progressive nature of the disease, extreme LE weakness   PT Frequency 2x / week   PT Duration 8 weeks   PT Treatment/Interventions ADLs/Self Care Home Management;Biofeedback;Electrical Stimulation;Cryotherapy;Ultrasound;DME Instruction;Gait training;Functional mobility training;Therapeutic activities;Therapeutic exercise;Balance training;Neuromuscular re-education;Patient/family education;Energy conservation;Passive range of motion;Wheelchair mobility training;Manual techniques   PT Next Visit Plan bed mobility, sidelying<>seated, strengthening, supported standing, seated posture   PT Home Exercise Plan continue as given;    Consulted and Agree with Plan of Care Patient      Patient will benefit from skilled therapeutic intervention in order to improve the following deficits and impairments:  Decreased activity tolerance, Decreased balance, Decreased coordination, Decreased endurance, Decreased mobility, Decreased safety awareness, Increased edema, Hypomobility, Decreased strength, Abnormal gait, Impaired flexibility, Impaired sensation, Impaired tone, Pain, Obesity, Postural dysfunction, Improper body mechanics  Visit Diagnosis: Muscle weakness (generalized)  Unspecified lack of coordination  Unsteadiness on feet     Problem List Patient Active Problem List   Diagnosis Date Noted  . Sepsis ([place]) 01/12/2016  . Complicated UTI (urinary tract  infection) 08/09/2015  . Exacerbation of multiple sclerosis ([place]) 08/09/2015  . 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 Multiple sclerosis ([place])   . Neuropathic pain 05/21/2015  . History of construction of external stoma of urinary system ([place]) 04/29/2015  . History of spinal surgery 04/29/2015  . Edema leg 09/05/2014  . Adiposity 09/05/2014  . Abdominal wall hernia 09/05/2014  . Multiple sclerosis, primary progressive ([place]) 01/09/2013  . Neurogenic bowel 08/12/2012  . Infection of urinary tract 04/07/2006  . 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 I have personally read, edited and approve of the note as written.  Trotter,Margaret PT, DPT 07/25/2016, 5:57 PM  [place] MAIN [facility] SERVICES [address] [place], [place], [address] Phone: [phone]   Fax:  [phone]  Name: [name] MRN: [id number] Date of Birth: 10-Oct-1970

## 2016-07-27 ENCOUNTER — Ambulatory Visit: Payer: Medicare Other | Admitting: Physical Therapy

## 2016-08-02 ENCOUNTER — Ambulatory Visit: Payer: Medicare Other | Admitting: Physical Therapy

## 2016-08-09 ENCOUNTER — Encounter: Payer: Self-pay | Admitting: Physical Therapy

## 2016-08-09 ENCOUNTER — Ambulatory Visit: Payer: Medicare Other | Attending: Internal Medicine | Admitting: Physical Therapy

## 2016-08-09 DIAGNOSIS — R279 Unspecified lack of coordination: Secondary | ICD-10-CM

## 2016-08-09 DIAGNOSIS — M6281 Muscle weakness (generalized): Secondary | ICD-10-CM | POA: Insufficient documentation

## 2016-08-09 DIAGNOSIS — R2681 Unsteadiness on feet: Secondary | ICD-10-CM | POA: Diagnosis present

## 2016-08-09 NOTE — Therapy (Signed)
[place] MAIN [facility] SERVICES [address] [place], [place], [address] Phone: [phone]   Fax:  [phone]  Physical Therapy Treatment/Discharge Summary  Patient Details  Name: [name] MRN: [id number] Date of Birth: 03-27-1971 Referring Provider: Dr Felipe Drone (neurologist), Dr. [name] (referring provide)  Encounter Date: 08/09/2016      PT End of Session - 08/09/16 1347    Visit Number 15   Number of Visits 33   Date for PT Re-Evaluation 08/16/16   Authorization Type G code 5   Authorization Time Period 10   PT Start Time 1126   PT Stop Time 1204   PT Time Calculation (min) 38 min   Equipment Utilized During Treatment Gait belt   Activity Tolerance Patient tolerated treatment well   Behavior During Therapy WFL for tasks assessed/performed      Past Medical History:  Diagnosis Date  . Abdominal wall hernia 09/05/2014  . 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 Edema leg 09/05/2014  . History of construction of external stoma of urinary system ([place]) 04/29/2015  . MS (multiple sclerosis) ([place]) 2005  . Multiple sclerosis, primary progressive ([place]) 01/09/2013  . Neurogenic bowel 08/12/2012  . Neuropathic pain 05/21/2015    Past Surgical History:  Procedure Laterality Date  . ABDOMINAL HYSTERECTOMY  2001  . BACK SURGERY  2004  . REVISION UROSTOMY CUTANEOUS  06/2012    There were no vitals filed for this visit.      Subjective Assessment - 08/09/16 1131    Subjective Patient notes that the cold is bothering her, and she would like to be discharged today in order to do things with HHPT because of transportation issues. She states she was sick last week and couldn't get out of bed but her body was the only thing affected.   Pertinent History Pertinent Hx for Therapy: progressive diagnosis, older mother is primary care giver, limited transportation    Limitations Sitting;Standing;Walking   How long can you sit comfortably? few  hours    How long can you stand comfortably? unable   How long can you walk comfortably? unable   Diagnostic tests MRI in next couple of months   Patient Stated Goals stand walk, get in and out    Currently in Pain? No/denies             [facility] PT Assessment - 08/09/16 0001      Strength   Overall Strength Comments Gross BUE strength is 4+/5   Right Hip Flexion 2-/5   Right Hip Extension 2-/5   Left Hip Flexion 2-/5   Left Hip Extension 2-/5   Right Knee Extension 2-/5   Left Knee Extension 2-/5   Right Ankle Dorsiflexion 2-/5   Left Ankle Dorsiflexion 2-/5      Treatment: Previous to TherAct: Forward ball rolls from w/c to address core and postural weakness, min VCs to perform scapular retractions at the end of motion and to also decrease upper trap activation Seated in w/c; knee ext/flex x5 each LE to warm up BLE before transfers; minA to increase ROM, min VCs to perform breathing during this activity Once in sidelying, instructed patient to perform hip flex/ext, 2x5, minA from SPT to increase range, min VCs to count out loud to decrease breath hold Seated static balance, ~2 min. Patient has improved static balance with less balance loss and is indep. At edge of bed when no external movements are occurring.   After TherEx: Transfers to<>from mat table; modA  with patient being indep in set up, blocking of BLE for safety, instructed patient to continue practice with home health PT (HHPT) and to wait for HHPT to train aide.  Patient requires rest in hooklying position for 2 minutes in order to decrease spasm in low back. Bed mobility; x2 each direction; maxA with BLE when moving from sitting to sidelying with no assist needed with BLE when moving from sidelying to sitting, minA to maintain BLE in hooklying and to assist with rolling motion each direction, min VCs for appropriate hand placement during log roll technique.   Instructed to perform movement like sit to stand,  instructed to push with BUEs and push through BLEs, x4, airex pad underneath feet in order for patient to see how much weight she was putting down through BLE; SPT blocking BLE and performing minA at upper body in order to increase lift                         PT Education - 08/09/16 1347    Education provided Yes   Education Details sliding board transfers, POC   Person(s) Educated Patient   Methods Explanation   Comprehension Verbalized understanding             PT Long Term Goals - 08/09/16 Startup #1   Title Patient will be independent in HEP in order to increase patient's ability to maintain gains achieved in therapy and assist with return to PLOF.    Time 8   Period Weeks   Status Achieved     PT LONG TERM GOAL #2   Title Patient will increase overall strength to 4+/5 in BUE and 3+/5 B hip/knee extension in order to complete transfers safely with decreased risk for falls.    Time 8   Period Weeks   Status Partially Met     PT LONG TERM GOAL #3   Title Patient will demonstrate ability to stand for 3 minutes with least restrictive device in order to allow for pressure relief and partcipation in ADLs.   Time 8   Period Weeks   Status Not Met     PT LONG TERM GOAL #4   Title Patient will be able to walk 10 steps with least restrictive device with min assist in order to walk from w/c to bathroom safely   Time 8   Period Weeks   Status Not Met     PT LONG TERM GOAL #5   Title Patient will perform stand pivot transfers with least restrictive device and mod assist in order to decrease caregiver burden and increase participation in ADLs.    Baseline modA with sliding board transfers on 08/09/16   Time 8   Period Weeks   Status Partially Met     PT LONG TERM GOAL #6   Title Patient will demonstrate ability to perform bed mobility rolling to bilateral directions in order to improve ability to pressure relief.    Baseline minA on  08/09/16   Time 8   Period Weeks   Status Partially Met               Plan - 08/09/16 1348    Clinical Impression Statement The pt requires modA with sliding board transfer to/from mat<>w/c with no verbal cueing for set up. Pt was discharged today due to poor ability to have consistent transportation, and patient worrying about the cold and  difficulty performing therapy once cold outside. The patient's goals were re-assessed, and she had progressed in bed moblity and UE strength but saw no other changes relating to goals. The patient seems to have increased spasticity. The pt requires minA-maxA during bed mobility. With minA during rolling to help with BLE and maxA with LE during sitting<>sidelying. The pt is discharged with a reccomendation to start HHPT and continue working on transfers and train aide.    Rehab Potential Fair   Clinical Impairments Affecting Rehab Potential Positive Factors: young, motivated Negative Factors: progressive disease, little-no LE strength, co-morbidities Clinical Presentation: evolving-progressive nature of the disease, extreme LE weakness   PT Frequency 2x / week   PT Duration 8 weeks   PT Treatment/Interventions ADLs/Self Care Home Management;Biofeedback;Electrical Stimulation;Cryotherapy;Ultrasound;DME Instruction;Gait training;Functional mobility training;Therapeutic activities;Therapeutic exercise;Balance training;Neuromuscular re-education;Patient/family education;Energy conservation;Passive range of motion;Wheelchair mobility training;Manual techniques   PT Next Visit Plan bed mobility, sidelying<>seated, strengthening, supported standing, seated posture   PT Home Exercise Plan continue as given;    Consulted and Agree with Plan of Care Patient      Patient will benefit from skilled therapeutic intervention in order to improve the following deficits and impairments:  Decreased activity tolerance, Decreased balance, Decreased coordination, Decreased  endurance, Decreased mobility, Decreased safety awareness, Increased edema, Hypomobility, Decreased strength, Abnormal gait, Impaired flexibility, Impaired sensation, Impaired tone, Pain, Obesity, Postural dysfunction, Improper body mechanics  Visit Diagnosis: Muscle weakness (generalized)  Unspecified lack of coordination  Unsteadiness on feet       G-Codes - 08-12-2016 1424    Functional Assessment Tool Used clinical judgement, strength, mobility   Functional Limitation Mobility: Walking and moving around   Mobility: Walking and Moving Around Goal Status [phone]) At least 40 percent but less than 60 percent impaired, limited or restricted   Mobility: Walking and Moving Around Discharge Status [phone]) At least 40 percent but less than 60 percent impaired, limited or restricted      Problem List Patient Active Problem List   Diagnosis Date Noted  . Sepsis ([place]) 01/12/2016  . Complicated UTI (urinary tract infection) 08/09/2015  . Exacerbation of multiple sclerosis ([place]) 08/09/2015  . 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 Multiple sclerosis ([place])   . Neuropathic pain 05/21/2015  . History of construction of external stoma of urinary system ([place]) 04/29/2015  . History of spinal surgery 04/29/2015  . Edema leg 09/05/2014  . Adiposity 09/05/2014  . Abdominal wall hernia 09/05/2014  . Multiple sclerosis, primary progressive ([place]) 01/09/2013  . Neurogenic bowel 08/12/2012  . Infection of urinary tract 04/07/2006  . 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 I have personally read, edited and approve of the note as written.  Trotter,Margaret PT, DPT 2016-08-12, 2:25 PM  [place] MAIN [facility] SERVICES [address] [place], [place], [address] Phone: [phone]   Fax:  [phone]  Name:  CAROLYNN TULEY MRN: [id number] Date of Birth: 09-13-1971

## 2016-08-16 ENCOUNTER — Ambulatory Visit: Payer: Medicare Other | Admitting: Physical Therapy

## 2017-01-20 ENCOUNTER — Inpatient Hospital Stay: Payer: Medicare Other

## 2017-01-20 ENCOUNTER — Emergency Department: Payer: Medicare Other

## 2017-01-20 ENCOUNTER — Inpatient Hospital Stay
Admission: EM | Admit: 2017-01-20 | Discharge: 2017-01-24 | DRG: 872 | Disposition: A | Discharge status: Home Health | Payer: Medicare Other | Attending: Internal Medicine | Admitting: Internal Medicine

## 2017-01-20 DIAGNOSIS — Z79899 Other long term (current) drug therapy: Secondary | ICD-10-CM | POA: Diagnosis not present

## 2017-01-20 DIAGNOSIS — K5900 Constipation, unspecified: Secondary | ICD-10-CM | POA: Diagnosis present

## 2017-01-20 DIAGNOSIS — G35 Multiple sclerosis: Secondary | ICD-10-CM | POA: Diagnosis present

## 2017-01-20 DIAGNOSIS — G8929 Other chronic pain: Secondary | ICD-10-CM | POA: Diagnosis present

## 2017-01-20 DIAGNOSIS — Z7401 Bed confinement status: Secondary | ICD-10-CM

## 2017-01-20 DIAGNOSIS — R609 Edema, unspecified: Secondary | ICD-10-CM

## 2017-01-20 DIAGNOSIS — N319 Neuromuscular dysfunction of bladder, unspecified: Secondary | ICD-10-CM | POA: Diagnosis present

## 2017-01-20 DIAGNOSIS — N12 Tubulo-interstitial nephritis, not specified as acute or chronic: Secondary | ICD-10-CM | POA: Diagnosis present

## 2017-01-20 DIAGNOSIS — G629 Polyneuropathy, unspecified: Secondary | ICD-10-CM | POA: Diagnosis present

## 2017-01-20 DIAGNOSIS — K592 Neurogenic bowel, not elsewhere classified: Secondary | ICD-10-CM | POA: Diagnosis present

## 2017-01-20 DIAGNOSIS — F329 Major depressive disorder, single episode, unspecified: Secondary | ICD-10-CM | POA: Diagnosis present

## 2017-01-20 DIAGNOSIS — A419 Sepsis, unspecified organism: Principal | ICD-10-CM | POA: Diagnosis present

## 2017-01-20 DIAGNOSIS — Z936 Other artificial openings of urinary tract status: Secondary | ICD-10-CM

## 2017-01-20 DIAGNOSIS — R509 Fever, unspecified: Secondary | ICD-10-CM | POA: Diagnosis present

## 2017-01-20 DIAGNOSIS — R109 Unspecified abdominal pain: Secondary | ICD-10-CM

## 2017-01-20 LAB — URINALYSIS, ROUTINE W REFLEX MICROSCOPIC
BILIRUBIN URINE: NEGATIVE
GLUCOSE, UA: NEGATIVE mg/dL
KETONES UR: 5 mg/dL — AB
Nitrite: NEGATIVE
Protein, ur: 100 mg/dL — AB
Specific Gravity, Urine: 1.013 (ref 1.005–1.030)
pH: 6 (ref 5.0–8.0)

## 2017-01-20 LAB — CBC WITH DIFFERENTIAL/PLATELET
BAND NEUTROPHILS: 0 %
BASOS ABS: 0 10*3/uL (ref 0–0.1)
BASOS ABS: 0 10*3/uL (ref 0–0.1)
BLASTS: 0 %
Band Neutrophils: 4 %
Basophils Relative: 0 %
Basophils Relative: 0 %
Blasts: 0 %
EOS ABS: 0 10*3/uL (ref 0–0.7)
EOS PCT: 0 %
Eosinophils Absolute: 0 10*3/uL (ref 0–0.7)
Eosinophils Relative: 0 %
HCT: 38.3 % (ref 35.0–47.0)
HEMATOCRIT: 32.9 % — AB (ref 35.0–47.0)
HEMOGLOBIN: 10.8 g/dL — AB (ref 12.0–16.0)
Hemoglobin: 12.8 g/dL (ref 12.0–16.0)
LYMPHS ABS: 0 10*3/uL — AB (ref 1.0–3.6)
LYMPHS PCT: 5 %
Lymphocytes Relative: 0 %
Lymphs Abs: 1.3 10*3/uL (ref 1.0–3.6)
MCH: 29.3 pg (ref 26.0–34.0)
MCH: 28.8 pg (ref 26.0–34.0)
MCHC: 33.4 g/dL (ref 32.0–36.0)
MCHC: 33 g/dL (ref 32.0–36.0)
MCV: 87.8 fL (ref 80.0–100.0)
MCV: 87.3 fL (ref 80.0–100.0)
METAMYELOCYTES PCT: 0 %
MONOS PCT: 4 %
MONOS PCT: 3 %
Metamyelocytes Relative: 0 %
Monocytes Absolute: 1.4 10*3/uL — ABNORMAL HIGH (ref 0.2–0.9)
Monocytes Absolute: 0.8 10*3/uL (ref 0.2–0.9)
Myelocytes: 0 %
Myelocytes: 0 %
NEUTROS ABS: 33.3 10*3/uL — AB (ref 1.4–6.5)
NEUTROS ABS: 24.6 10*3/uL — AB (ref 1.4–6.5)
Neutrophils Relative %: 92 %
Neutrophils Relative %: 92 %
OTHER: 0 %
OTHER: 0 %
PLATELETS: 505 10*3/uL — AB (ref 150–440)
PROMYELOCYTES ABS: 0 %
Platelets: 430 10*3/uL (ref 150–440)
Promyelocytes Absolute: 0 %
RBC: 4.37 MIL/uL (ref 3.80–5.20)
RBC: 3.76 MIL/uL — AB (ref 3.80–5.20)
RDW: 15.5 % — AB (ref 11.5–14.5)
RDW: 15.4 % — AB (ref 11.5–14.5)
WBC: 34.7 10*3/uL — ABNORMAL HIGH (ref 3.6–11.0)
WBC: 26.7 10*3/uL — AB (ref 3.6–11.0)
nRBC: 0 /100 WBC
nRBC: 0 /100 WBC

## 2017-01-20 LAB — BLOOD CULTURE ID PANEL (REFLEXED)
ACINETOBACTER BAUMANNII: NOT DETECTED
CANDIDA ALBICANS: NOT DETECTED
CANDIDA GLABRATA: NOT DETECTED
CANDIDA KRUSEI: NOT DETECTED
CANDIDA PARAPSILOSIS: NOT DETECTED
CANDIDA TROPICALIS: NOT DETECTED
ENTEROBACTER CLOACAE COMPLEX: NOT DETECTED
ENTEROBACTERIACEAE SPECIES: NOT DETECTED
ESCHERICHIA COLI: NOT DETECTED
Enterococcus species: NOT DETECTED
Haemophilus influenzae: NOT DETECTED
KLEBSIELLA OXYTOCA: NOT DETECTED
KLEBSIELLA PNEUMONIAE: NOT DETECTED
Listeria monocytogenes: NOT DETECTED
METHICILLIN RESISTANCE: NOT DETECTED
Neisseria meningitidis: NOT DETECTED
PSEUDOMONAS AERUGINOSA: NOT DETECTED
Proteus species: NOT DETECTED
STREPTOCOCCUS PNEUMONIAE: NOT DETECTED
STREPTOCOCCUS PYOGENES: NOT DETECTED
Serratia marcescens: NOT DETECTED
Staphylococcus aureus (BCID): NOT DETECTED
Staphylococcus species: DETECTED — AB
Streptococcus agalactiae: NOT DETECTED
Streptococcus species: NOT DETECTED

## 2017-01-20 LAB — COMPREHENSIVE METABOLIC PANEL
ALBUMIN: 3.6 g/dL (ref 3.5–5.0)
ALT: 24 U/L (ref 14–54)
AST: 19 U/L (ref 15–41)
Alkaline Phosphatase: 77 U/L (ref 38–126)
Anion gap: 12 (ref 5–15)
BUN: 13 mg/dL (ref 6–20)
CHLORIDE: 101 mmol/L (ref 101–111)
CO2: 24 mmol/L (ref 22–32)
Calcium: 8.8 mg/dL — ABNORMAL LOW (ref 8.9–10.3)
Creatinine, Ser: 0.71 mg/dL (ref 0.44–1.00)
GFR calc Af Amer: >60 mL/min (ref >60)
GFR calc non Af Amer: >60 mL/min (ref >60)
GLUCOSE: 145 mg/dL — AB (ref 65–99)
POTASSIUM: 3.9 mmol/L (ref 3.5–5.1)
Sodium: 137 mmol/L (ref 135–145)
Total Bilirubin: 0.9 mg/dL (ref 0.3–1.2)
Total Protein: 8.6 g/dL — ABNORMAL HIGH (ref 6.5–8.1)

## 2017-01-20 LAB — INFLUENZA PANEL BY PCR (TYPE A & B)
INFLBPCR: NEGATIVE
Influenza A By PCR: NEGATIVE

## 2017-01-20 LAB — GLUCOSE, CAPILLARY: Glucose-Capillary: 138 mg/dL — ABNORMAL HIGH (ref 65–99)

## 2017-01-20 LAB — LACTIC ACID, PLASMA: LACTIC ACID, VENOUS: 1.2 mmol/L (ref 0.5–1.9)

## 2017-01-20 MED ORDER — SODIUM CHLORIDE 0.9% FLUSH
3.0000 mL | Freq: Two times a day (BID) | INTRAVENOUS | Status: DC
  Administered 2017-01-20 – 2017-01-24 (×9): 3 mL via INTRAVENOUS

## 2017-01-20 MED ORDER — DOCUSATE SODIUM 100 MG PO CAPS
100.0000 mg | ORAL_CAPSULE | Freq: Two times a day (BID) | ORAL | Status: DC
  Administered 2017-01-20 – 2017-01-24 (×8): 100 mg via ORAL
  Filled 2017-01-20 (×8): qty 1

## 2017-01-20 MED ORDER — AMANTADINE HCL 100 MG PO CAPS
100.0000 mg | ORAL_CAPSULE | Freq: Two times a day (BID) | ORAL | Status: DC
  Administered 2017-01-20 – 2017-01-24 (×9): 100 mg via ORAL
  Filled 2017-01-20 (×10): qty 1

## 2017-01-20 MED ORDER — VANCOMYCIN HCL IN DEXTROSE 1-5 GM/200ML-% IV SOLN
1000.0000 mg | Freq: Once | INTRAVENOUS | Status: AC
  Administered 2017-01-20: 1000 mg via INTRAVENOUS
  Filled 2017-01-20: qty 200

## 2017-01-20 MED ORDER — GABAPENTIN 400 MG PO CAPS
800.0000 mg | ORAL_CAPSULE | Freq: Four times a day (QID) | ORAL | Status: DC
  Administered 2017-01-20 – 2017-01-24 (×17): 800 mg via ORAL
  Filled 2017-01-20 (×18): qty 2

## 2017-01-20 MED ORDER — VANCOMYCIN HCL 10 G IV SOLR
1250.0000 mg | Freq: Three times a day (TID) | INTRAVENOUS | Status: DC
  Filled 2017-01-20 (×2): qty 1250

## 2017-01-20 MED ORDER — ACETAMINOPHEN 325 MG PO TABS
650.0000 mg | ORAL_TABLET | Freq: Four times a day (QID) | ORAL | Status: DC | PRN
  Administered 2017-01-20 – 2017-01-22 (×2): 650 mg via ORAL
  Filled 2017-01-20 (×2): qty 2

## 2017-01-20 MED ORDER — PIPERACILLIN-TAZOBACTAM 3.375 G IVPB
3.3750 g | Freq: Three times a day (TID) | INTRAVENOUS | Status: DC
  Administered 2017-01-20 – 2017-01-24 (×12): 3.375 g via INTRAVENOUS
  Filled 2017-01-20 (×19): qty 50

## 2017-01-20 MED ORDER — DULOXETINE HCL 30 MG PO CPEP
30.0000 mg | ORAL_CAPSULE | Freq: Two times a day (BID) | ORAL | Status: DC
  Administered 2017-01-20 – 2017-01-24 (×9): 30 mg via ORAL
  Filled 2017-01-20 (×9): qty 1

## 2017-01-20 MED ORDER — ACETAMINOPHEN 500 MG PO TABS
1000.0000 mg | ORAL_TABLET | Freq: Once | ORAL | Status: AC
  Administered 2017-01-20: 1000 mg via ORAL

## 2017-01-20 MED ORDER — SODIUM CHLORIDE 0.9 % IV SOLN
Freq: Once | INTRAVENOUS | Status: AC
  Administered 2017-01-20: 04:00:00 via INTRAVENOUS

## 2017-01-20 MED ORDER — SODIUM CHLORIDE 0.9 % IV BOLUS (SEPSIS)
1000.0000 mL | Freq: Once | INTRAVENOUS | Status: AC
  Administered 2017-01-20: 1000 mL via INTRAVENOUS

## 2017-01-20 MED ORDER — PIPERACILLIN-TAZOBACTAM 3.375 G IVPB 30 MIN
3.3750 g | Freq: Once | INTRAVENOUS | Status: AC
  Administered 2017-01-20: 3.375 g via INTRAVENOUS
  Filled 2017-01-20: qty 50

## 2017-01-20 MED ORDER — ENOXAPARIN SODIUM 40 MG/0.4ML ~~LOC~~ SOLN
40.0000 mg | SUBCUTANEOUS | Status: DC
  Administered 2017-01-20: 40 mg via SUBCUTANEOUS
  Filled 2017-01-20: qty 0.4

## 2017-01-20 MED ORDER — BACLOFEN 10 MG PO TABS
20.0000 mg | ORAL_TABLET | Freq: Three times a day (TID) | ORAL | Status: DC
  Administered 2017-01-20 – 2017-01-24 (×12): 20 mg via ORAL
  Filled 2017-01-20 (×12): qty 2

## 2017-01-20 MED ORDER — ACETAMINOPHEN 650 MG RE SUPP
650.0000 mg | Freq: Four times a day (QID) | RECTAL | Status: DC | PRN
Start: 2017-01-20 — End: 2017-01-24

## 2017-01-20 MED ORDER — ONDANSETRON HCL 4 MG PO TABS: 4.0000 mg | ORAL_TABLET | Freq: Four times a day (QID) | ORAL | Status: DC | PRN

## 2017-01-20 MED ORDER — ACETAMINOPHEN 500 MG PO TABS
ORAL_TABLET | ORAL | Status: AC
  Filled 2017-01-20: qty 2

## 2017-01-20 MED ORDER — SODIUM CHLORIDE 0.9 % IV SOLN
INTRAVENOUS | Status: DC
  Administered 2017-01-20 – 2017-01-21 (×3): via INTRAVENOUS

## 2017-01-20 MED ORDER — PIPERACILLIN-TAZOBACTAM 3.375 G IVPB 30 MIN: 3.3750 g | Freq: Once | INTRAVENOUS | Status: DC

## 2017-01-20 MED ORDER — ENOXAPARIN SODIUM 40 MG/0.4ML ~~LOC~~ SOLN
40.0000 mg | SUBCUTANEOUS | Status: DC
  Administered 2017-01-21 – 2017-01-23 (×3): 40 mg via SUBCUTANEOUS
  Filled 2017-01-20 (×3): qty 0.4

## 2017-01-20 MED ORDER — VANCOMYCIN HCL IN DEXTROSE 1-5 GM/200ML-% IV SOLN: 1000.0000 mg | Freq: Once | INTRAVENOUS | Status: DC

## 2017-01-20 MED ORDER — GABAPENTIN 800 MG PO TABS
800.0000 mg | ORAL_TABLET | Freq: Four times a day (QID) | ORAL | Status: DC
  Filled 2017-01-20 (×3): qty 1

## 2017-01-20 MED ORDER — SODIUM CHLORIDE 0.9 % IV SOLN
Freq: Once | INTRAVENOUS | Status: AC
  Administered 2017-01-20: 05:00:00 via INTRAVENOUS

## 2017-01-20 MED ORDER — ONDANSETRON HCL 4 MG/2ML IJ SOLN: 4.0000 mg | Freq: Four times a day (QID) | INTRAMUSCULAR | Status: DC | PRN

## 2017-01-20 MED ORDER — OXYCODONE HCL 5 MG PO TABS
5.0000 mg | ORAL_TABLET | ORAL | Status: DC | PRN
  Administered 2017-01-20 – 2017-01-22 (×4): 5 mg via ORAL
  Filled 2017-01-20 (×4): qty 1

## 2017-01-20 NOTE — H&P (Signed)
[facility] Physicians - [place] at [facility]   PATIENT NAME: [name]    MR#:  [id number]  DATE OF BIRTH:  [birth year]  DATE OF ADMISSION:  01/20/2017  PRIMARY CARE PHYSICIAN: Gladis Riffle, MD   REQUESTING/REFERRING PHYSICIAN:   CHIEF COMPLAINT:   Chief Complaint  Patient presents with  . Fever  . Weakness    HISTORY OF PRESENT ILLNESS: [name]  is a 46 y.o. female with a known history of Abdominal wall hernia, neurogenic bladder, multiple sclerosis, neuropathic pain, urostomy presented to the emergency room with fever and weakness. Fever is going on since Wednesday and patient has some chills. Patient also has nausea. Patient has been diagnosed with multiple sclerosis since 2005 and she is mostly bed bound. Moves around with the help of a wheelchair. Patient was evaluated in the emergency room and code sepsis was called and patient was given IV Zosyn antibiotic. She also received IV fluid bolus for borderline blood pressure. No history of any fall and head injury. Hospitalist service was consulted for further care of the patient.  PAST MEDICAL HISTORY:   Past Medical History:  Diagnosis Date  . Abdominal wall hernia 09/05/2014  . 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 Edema leg 09/05/2014  . 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 MS (multiple sclerosis) ([facility]) 2005  . 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 Neurogenic bowel 08/12/2012  . Neuropathic pain 05/21/2015    PAST SURGICAL HISTORY: Past Surgical History:  Procedure Laterality Date  . ABDOMINAL HYSTERECTOMY  2001  . BACK SURGERY  2004  . REVISION UROSTOMY CUTANEOUS  06/2012    SOCIAL HISTORY:  Social History  Substance Use Topics  . Smoking status: Never Smoker  . Smokeless tobacco: Never Used  . Alcohol use 0.0 oz/week    FAMILY HISTORY:  Family History  Problem Relation Age of Onset  . Osteoarthritis Mother   . Hypertension Mother    . Hyperlipidemia Mother   . Thyroid disease Mother   . Cancer Father   . Asthma Brother     DRUG ALLERGIES:  Allergies  Allergen Reactions  . Latex Swelling and Other (See Comments)    Reaction:  Facial swelling     REVIEW OF SYSTEMS:   CONSTITUTIONAL: Has fever, fatigue and weakness.  EYES: No blurred or double vision.  EARS, NOSE, AND THROAT: No tinnitus or ear pain.  RESPIRATORY: No cough, shortness of breath, wheezing or hemoptysis.  CARDIOVASCULAR: No chest pain, orthopnea, edema.  GASTROINTESTINAL: No nausea, vomiting, diarrhea or abdominal pain.  GENITOURINARY: No hematuria ENDOCRINE: No polyuria, nocturia,  HEMATOLOGY: No anemia, easy bruising or bleeding SKIN: No rash or lesion. MUSCULOSKELETAL: No joint pain or arthritis.   NEUROLOGIC: No tingling, numbness, weakness.  PSYCHIATRY: No anxiety or depression.   MEDICATIONS AT HOME:  Prior to Admission medications   Medication Sig Start Date End Date Taking? Authorizing Provider  acetaminophen (TYLENOL) 500 MG tablet Take 500 mg by mouth every 6 (six) hours as needed.   Yes Historical Provider, MD  amantadine (SYMMETREL) 100 MG capsule Take 100 mg by mouth 2 (two) times daily.   Yes Historical Provider, MD  baclofen (LIORESAL) 20 MG tablet Take 20 mg by mouth 3 (three) times daily.   Yes Historical Provider, MD  DULoxetine (CYMBALTA) 30 MG capsule Take 30 mg by mouth 2 (two) times daily.   Yes Historical Provider, MD  gabapentin (NEURONTIN) 600 MG tablet Take 600 mg by  mouth 4 (four) times daily.    Yes Historical Provider, MD  Vitamin D, Ergocalciferol, (DRISDOL) 50000 units CAPS capsule Take 50,000 Units by mouth every 7 (seven) days. Pt takes on Monday.   Yes Historical Provider, MD  cephALEXin (KEFLEX) 500 MG capsule Take 1 capsule (500 mg total) by mouth 3 (three) times daily. Patient not taking: Reported on 01/20/2017 01/15/16   Milagros Loll, MD      PHYSICAL EXAMINATION:   VITAL SIGNS: Blood pressure (!)  100/57, pulse (!) 114, temperature 98.7 F (37.1 C), temperature source Oral, resp. rate 19, height 5\' 4"  (1.626 m), weight 90.7 kg (200 lb), SpO2 96 %.  GENERAL:  46 y.o.-year-old patient lying in the bed with no acute distress.  EYES: Pupils equal, round, reactive to light and accommodation. No scleral icterus. Extraocular muscles intact.  HEENT: Head atraumatic, normocephalic. Oropharynx and nasopharynx clear.  NECK:  Supple, no jugular venous distention. No thyroid enlargement, no tenderness.  LUNGS: Normal breath sounds bilaterally, no wheezing, rales,rhonchi or crepitation. No use of accessory muscles of respiration.  CARDIOVASCULAR: S1, S2 normal. No murmurs, rubs, or gallops.  ABDOMEN: Soft, nontender, nondistended. Bowel sounds present. No organomegaly or mass.  Has urostomy in place EXTREMITIES: Has pedal edema, no cyanosis, or clubbing.  NEUROLOGIC: Cranial nerves II through XII are intact. Has multiple sclerosis. PSYCHIATRIC: The patient is alert and oriented x 3.  SKIN: No obvious rash, lesion, or ulcer.   LABORATORY PANEL:   CBC  Recent Labs Lab 01/20/17 0335  WBC 34.7*  HGB 12.8  HCT 38.3  PLT 505*  MCV 87.8  MCH 29.3  MCHC 33.4  RDW 15.5*  LYMPHSABS 0.0*  MONOABS 1.4*  EOSABS 0.0  BASOSABS 0.0   ------------------------------------------------------------------------------------------------------------------  Chemistries   Recent Labs Lab 01/20/17 0335  NA 137  K 3.9  CL 101  CO2 24  GLUCOSE 145*  BUN 13  CREATININE 0.71  CALCIUM 8.8*  AST 19  ALT 24  ALKPHOS 77  BILITOT 0.9   ------------------------------------------------------------------------------------------------------------------ estimated creatinine clearance is 94.8 mL/min (by C-G formula based on SCr of 0.71 mg/dL). ------------------------------------------------------------------------------------------------------------------ No results for input(s): TSH, T4TOTAL, T3FREE,  THYROIDAB in the last 72 hours.  Invalid input(s): FREET3   Coagulation profile No results for input(s): INR, PROTIME in the last 168 hours. ------------------------------------------------------------------------------------------------------------------- No results for input(s): DDIMER in the last 72 hours. -------------------------------------------------------------------------------------------------------------------  Cardiac Enzymes No results for input(s): CKMB, TROPONINI, MYOGLOBIN in the last 168 hours.  Invalid input(s): CK ------------------------------------------------------------------------------------------------------------------ Invalid input(s): POCBNP  ---------------------------------------------------------------------------------------------------------------  Urinalysis    Component Value Date/Time   COLORURINE AMBER (A) 01/20/2017 0335   APPEARANCEUR TURBID (A) 01/20/2017 0335   APPEARANCEUR Cloudy 10/05/2014 2100   LABSPEC 1.013 01/20/2017 0335   LABSPEC 1.009 10/05/2014 2100   PHURINE 6.0 01/20/2017 0335   GLUCOSEU NEGATIVE 01/20/2017 0335   GLUCOSEU Negative 10/05/2014 2100   HGBUR MODERATE (A) 01/20/2017 0335   BILIRUBINUR NEGATIVE 01/20/2017 0335   BILIRUBINUR Negative 10/05/2014 2100   KETONESUR 5 (A) 01/20/2017 0335   PROTEINUR 100 (A) 01/20/2017 0335   NITRITE NEGATIVE 01/20/2017 0335   LEUKOCYTESUR MODERATE (A) 01/20/2017 0335   LEUKOCYTESUR 3+ 10/05/2014 2100     RADIOLOGY: Dg Chest 2 View  Result Date: 01/20/2017 CLINICAL DATA:  46 year old female with weakness. Chest radiograph dated 07/19/2015 EXAM: CHEST  2 VIEW COMPARISON:  None. FINDINGS: There is shallow inspiration. Minimal right lung base streaky densities, likely atelectatic changes. Pneumonia is not entirely excluded. Clinical correlation is recommended. There is  no focal consolidation, pleural effusion, or pneumothorax. The cardiac silhouette is within normal limits. No  acute osseous pathology. IMPRESSION: Shallow inspiration with probable minimal right lung base atelectatic changes. No focal consolidation. Electronically Signed   By: Elgie Collard M.D.   On: 01/20/2017 04:48    EKG: Orders placed or performed during the hospital encounter of 01/20/17  . ED EKG 12-Lead  . ED EKG 12-Lead  . EKG 12-Lead  . EKG 12-Lead    IMPRESSION AND PLAN: 46 year old female patient with history of multiple sclerosis, neuropathic pain, neurogenic bladder presented to the emergency room with fever, chills and weakness. Has history of multiple sclerosis and permanent urostomy. Admitting diagnosis 1. Sepsis 2. Urinary tract infection 3. Multiple sclerosis 4. Neurogenic bladder Treatment plan It patient to medical floor IV  fluid hydration based on sepsis protocol Start patient on IV Zosyn antibiotic Follow-up cultures Muscle relaxant Supportive care   All the records are reviewed and case discussed with ED provider. Management plans discussed with the patient, family and they are in agreement.  CODE STATUS:FULL CODE     Code Status Orders        Start     Ordered   01/20/17 0601  Full code  Continuous     01/20/17 0603    Code Status History    Date Active Date Inactive Code Status Order ID Comments User Context   01/12/2016  4:48 PM 01/15/2016  6:24 PM Full Code [id number]  Houston Siren, MD ED   08/09/2015  5:24 PM 08/12/2015  9:35 PM Full Code [id number]  Marguarite Arbour, MD Inpatient   07/19/2015  2:54 PM 07/25/2015  1:51 PM Full Code [id number]  Gladis Riffle, MD Inpatient       TOTAL TIME TAKING CARE OF THIS PATIENT: 52 minutes.    Ihor Austin M.D on 01/20/2017 at 6:03 AM  Between 7am to 6pm - Pager - [phone]  After 6pm go to [email or web address] - password [email or web address] [facility]  [place] [facility]  Office  [phone]  CC: Primary care physician; Gladis Riffle, MD

## 2017-01-20 NOTE — Care Management Important Message (Signed)
Important Message  Patient Details  Name: [name] MRN: [id number] Date of Birth: January 23, 1971   Medicare Important Message Given:  Yes Signed IM notice given   Eber Hong, RN 01/20/2017, 12:19 PM

## 2017-01-20 NOTE — ED Notes (Signed)
Patient transported to X-ray 

## 2017-01-20 NOTE — Plan of Care (Signed)
Problem: Activity: Goal: Risk for activity intolerance will decrease Outcome: Progressing Patient is bedridden due to history of MS. Will continue to reposition every 2 hours and encourage mobility as tolerated through discharge.

## 2017-01-20 NOTE — ED Notes (Signed)
MD Malinda informed that BP  Is trending down.

## 2017-01-20 NOTE — ED Notes (Signed)
Admitting Provider at bedside. 

## 2017-01-20 NOTE — Progress Notes (Signed)
Pharmacy Antibiotic Note  [name] is a 46 y.o. female admitted on 01/20/2017 with sepsis.  Pharmacy has been consulted for vanc/zosyn dosing.  Plan: Patient received vanc 1g IV x 1 in ED  Will follow-up w/ vanc 1.25g IV q8h w/ 6-hour stack dosing Will check a vanc trough 4/28 @ 1130 prior to 4th dose Will initiate zosyn 3.375g IV q8h per extended infusion Ke 0.083 t1/2 8 hours  Height: 5\' 4"  (162.6 cm) Weight: 200 lb (90.7 kg) IBW/kg (Calculated) : 54.7  Temp (24hrs), Avg:100.6 F (38.1 C), Min:98.7 F (37.1 C), Max:102.4 F (39.1 C)   Recent Labs Lab 01/20/17 0335 01/20/17 0337  WBC 34.7*  --   CREATININE 0.71  --   LATICACIDVEN  --  1.2    Estimated Creatinine Clearance: 94.8 mL/min (by C-G formula based on SCr of 0.71 mg/dL).    Allergies  Allergen Reactions  . Latex Swelling and Other (See Comments)    Reaction:  Facial swelling      Thank you for allowing pharmacy to be a part of this patient's care.  Thomasene Ripple, PharmD, BCPS Clinical Pharmacist 01/20/2017

## 2017-01-20 NOTE — ED Triage Notes (Signed)
Patient c/o malaise beginning Wednesday, Diarrhea/vomiting beginning Thursday. Patient c/o fever, tmax 102.3

## 2017-01-20 NOTE — Progress Notes (Signed)
Patient ID: [name], female   DOB: 02/22/1971, 46 y.o.   MRN: [id number]  [employer] PROGRESS NOTE  [name] [name]:[id number] DOB: 10-24-1970 DOA: 01/20/2017 PCP: Gladis Riffle, MD  HPI/Subjective: Patient having some lower abdominal pain. She gets this when she gets urinary tract infections. Patient has urostomy tube. Some nausea. No diarrhea. Left arm swollen and red.  Objective: Vitals:   01/20/17 0800 01/20/17 1209  BP: (!) 119/54 (!) 124/101  Pulse: 95 98  Resp: 18 16  Temp: 98.3 F (36.8 C) 99.2 F (37.3 C)    Filed Weights   01/20/17 0328  Weight: 90.7 kg (200 lb)    ROS: Review of Systems  Constitutional: Negative for chills and fever.  Eyes: Negative for blurred vision.  Respiratory: Negative for cough and shortness of breath.   Cardiovascular: Negative for chest pain.  Gastrointestinal: Positive for abdominal pain, nausea and vomiting. Negative for constipation and diarrhea.  Genitourinary: Negative for dysuria.  Musculoskeletal: Negative for joint pain.  Neurological: Negative for dizziness and headaches.   Exam: Physical Exam  Constitutional: She is oriented to person, place, and time.  HENT:  Nose: No mucosal edema.  Mouth/Throat: No oropharyngeal exudate or posterior oropharyngeal edema.  Eyes: Conjunctivae, EOM and lids are normal. Pupils are equal, round, and reactive to light.  Neck: No JVD present. Carotid bruit is not present. No edema present. No thyroid mass and no thyromegaly present.  Cardiovascular: Regular rhythm, S1 normal, S2 normal and normal heart sounds.  Exam reveals no gallop.   No murmur heard. Pulses:      Dorsalis pedis pulses are 2+ on the right side, and 2+ on the left side.  Respiratory: No respiratory distress. She has decreased breath sounds in the right lower field and the left lower field. She has no wheezes. She has no rhonchi. She has no rales.  GI: Soft. Bowel sounds are normal. There is no tenderness.   Musculoskeletal:       Left elbow: She exhibits swelling.       Left wrist: She exhibits swelling.       Right ankle: She exhibits no swelling.       Left ankle: She exhibits no swelling.  Lymphadenopathy:    She has no cervical adenopathy.  Neurological: She is alert and oriented to person, place, and time.  Patient only able to wiggle toes on her lower extremities.  Skin: Skin is warm. No rash noted. Nails show no clubbing.  Psychiatric: She has a normal mood and affect.      Data Reviewed: Basic Metabolic Panel:  Recent Labs Lab 01/20/17 0335  NA 137  K 3.9  CL 101  CO2 24  GLUCOSE 145*  BUN 13  CREATININE 0.71  CALCIUM 8.8*   Liver Function Tests:  Recent Labs Lab 01/20/17 0335  AST 19  ALT 24  ALKPHOS 77  BILITOT 0.9  PROT 8.6*  ALBUMIN 3.6   CBC:  Recent Labs Lab 01/20/17 0335 01/20/17 0841  WBC 34.7* 26.7*  NEUTROABS 33.3* 24.6*  HGB 12.8 10.8*  HCT 38.3 32.9*  MCV 87.8 87.3  PLT 505* 430     Recent Results (from the past 240 hour(s))  Blood Culture (routine x 2)     Status: None (Preliminary result)   Collection Time: 01/20/17  3:35 AM  Result Value Ref Range Status   Specimen Description BLOOD RIGHT ARM  Final   Special Requests   Final    BOTTLES  DRAWN AEROBIC AND ANAEROBIC Blood Culture adequate volume   Culture NO GROWTH < 12 HOURS  Final   Report Status PENDING  Incomplete  Blood Culture (routine x 2)     Status: None (Preliminary result)   Collection Time: 01/20/17  3:35 AM  Result Value Ref Range Status   Specimen Description BLOOD RIGHT FOREARM  Final   Special Requests   Final    BOTTLES DRAWN AEROBIC AND ANAEROBIC Blood Culture adequate volume   Culture NO GROWTH < 12 HOURS  Final   Report Status PENDING  Incomplete     Studies: Dg Chest 2 View  Result Date: 01/20/2017 CLINICAL DATA:  46 year old female with weakness. Chest radiograph dated 07/19/2015 EXAM: CHEST  2 VIEW COMPARISON:  None. FINDINGS: There is shallow  inspiration. Minimal right lung base streaky densities, likely atelectatic changes. Pneumonia is not entirely excluded. Clinical correlation is recommended. There is no focal consolidation, pleural effusion, or pneumothorax. The cardiac silhouette is within normal limits. No acute osseous pathology. IMPRESSION: Shallow inspiration with probable minimal right lung base atelectatic changes. No focal consolidation. Electronically Signed   By: Elgie Collard M.D.   On: 01/20/2017 04:48    Scheduled Meds: . amantadine  100 mg Oral BID  . baclofen  20 mg Oral TID  . docusate sodium  100 mg Oral BID  . DULoxetine  30 mg Oral BID  . [START ON 01/21/2017] enoxaparin (LOVENOX) injection  40 mg Subcutaneous Q24H  . gabapentin  800 mg Oral QID  . sodium chloride flush  3 mL Intravenous Q12H   Continuous Infusions: . sodium chloride 75 mL/hr at 01/20/17 0948  . piperacillin-tazobactam (ZOSYN)  IV 3.375 g (01/20/17 1239)    Assessment/Plan:  1. Clinical sepsis secondary to urinary tract infection. Patient has a history of neurogenic bladder and a urostomy. This will increase her risk for infection. Patient with fever, tachycardia and leukocytosis. Continue Zosyn. Patient also received a dose of vancomycin in the ER. 2. Left arm swollen and only slight erythema. Zosyn would cover this also. One dose of vancomycin would cover. Ultrasound to rule out DVT. Not sure if there is an infection on the skin here. 3. Multiple sclerosis. Patient is bedbound and has chronic pain. Restarted amantadine, baclofen, gabapentin. 4. History of depression on Cymbalta  Code Status:     Code Status Orders        Start     Ordered   01/20/17 0601  Full code  Continuous     01/20/17 0603    Code Status History    Date Active Date Inactive Code Status Order ID Comments User Context   01/12/2016  4:48 PM 01/15/2016  6:24 PM Full Code [id number]  Houston Siren, MD ED   08/09/2015  5:24 PM 08/12/2015  9:35 PM Full Code  [id number]  Marguarite Arbour, MD Inpatient   07/19/2015  2:54 PM 07/25/2015  1:51 PM Full Code [id number]  Gladis Riffle, MD Inpatient     Disposition Plan: Home once doing better with regards to infection.  Antibiotics:  Zosyn  Time spent: 35 minutes  Alford Highland  [employer]

## 2017-01-20 NOTE — ED Provider Notes (Signed)
[facility] Emergency Department Provider Note   ____________________________________________   None    (approximate)  I have reviewed the triage vital signs and the nursing notes.   HISTORY  Chief Complaint Fever and Weakness    HPI [name] is a 46 y.o. female patient having fever go up and down since Wednesday diarrhea twice today vomiting also she developed a rash this afternoon this evening are red and warm on both arms on the outer surfaces of each arm. Patient has multiple sclerosis and a external stoma for the urinary system. She also has neurogenic bowel. She also has chronic edema of the legs. Legs are not painful or red. The swelling is unchanged there. She has no abdominal pain. She is coughing a little bit but not bringing anything up.   Past Medical History:  Diagnosis Date  . Abdominal wall hernia 09/05/2014  . 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 Edema leg 09/05/2014  . 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 MS (multiple sclerosis) (HCC) 2005  . 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 Neurogenic bowel 08/12/2012  . Neuropathic pain 05/21/2015    Patient Active Problem List   Diagnosis Date Noted  . Sepsis (HCC) 01/12/2016  . Complicated UTI (urinary tract infection) 08/09/2015  . Exacerbation of multiple sclerosis (HCC) 08/09/2015  . 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 Multiple sclerosis (HCC)   . Neuropathic pain 05/21/2015  . 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 History of spinal surgery 04/29/2015  . Edema leg 09/05/2014  . Adiposity 09/05/2014  . Abdominal wall hernia 09/05/2014  . 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 Neurogenic bowel 08/12/2012  . Infection of urinary tract 04/07/2006  . 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  ABDOMINAL HYSTERECTOMY  2001  . BACK SURGERY  2004  . REVISION UROSTOMY CUTANEOUS  06/2012    Prior to Admission medications   Medication Sig Start Date End Date Taking? Authorizing Provider  acetaminophen (TYLENOL) 500 MG tablet Take 500 mg by mouth every 6 (six) hours as needed.   Yes Historical Provider, MD  amantadine (SYMMETREL) 100 MG capsule Take 100 mg by mouth 2 (two) times daily.   Yes Historical Provider, MD  baclofen (LIORESAL) 20 MG tablet Take 20 mg by mouth 3 (three) times daily.   Yes Historical Provider, MD  DULoxetine (CYMBALTA) 30 MG capsule Take 30 mg by mouth 2 (two) times daily.   Yes Historical Provider, MD  gabapentin (NEURONTIN) 600 MG tablet Take 600 mg by mouth 4 (four) times daily.    Yes Historical Provider, MD  Vitamin D, Ergocalciferol, (DRISDOL) 50000 units CAPS capsule Take 50,000 Units by mouth every 7 (seven) days. Pt takes on Monday.   Yes Historical Provider, MD    Allergies Latex  Family History  Problem Relation Age of Onset  . Osteoarthritis Mother   . Hypertension Mother   . Hyperlipidemia Mother   . Thyroid disease Mother   . Cancer Father   . Asthma Brother     Social History Social History  Substance Use Topics  . Smoking status: Never Smoker  . Smokeless tobacco: Never Used  . Alcohol use 0.0 oz/week    Review of Systems  Constitutional:  fever/chills Eyes: No visual changes. ENT: No sore throat. Cardiovascular: Denies chest pain. Respiratory: Denies shortness of breath. Gastrointestinal:  No abdominal pain.   nausea,  vomiting. diarrhea.  No constipation. Genitourinary: Negative for dysuria. Musculoskeletal: Negative for back pain. Skin: Negative for rash. Neurological: Negative for headaches, focal weakness or numbness.   ____________________________________________   PHYSICAL EXAM:  VITAL SIGNS: ED Triage Vitals  Enc Vitals Group     BP 01/20/17 0324 (!) 122/51     Pulse Rate 01/20/17 0324 (!) 128     Resp  01/20/17 0324 19     Temp 01/20/17 0324 (!) 102.4 F (39.1 C)     Temp Source 01/20/17 0324 Oral     SpO2 01/20/17 0324 98 %     Weight 01/20/17 0328 200 lb (90.7 kg)     Height 01/20/17 0328 5\' 4"  (1.626 m)     Head Circumference --      Peak Flow --      Pain Score 01/20/17 0324 8     Pain Loc --      Pain Edu? --      Excl. in GC? --     Constitutional: Alert and oriented. Well appearing and in no acute distress. Eyes: Conjunctivae are normal. PERRL. EOMI. Head: Atraumatic. Nose: No congestion/rhinnorhea. Mouth/Throat: Mucous membranes are moist.  Oropharynx non-erythematous. Neck: No stridor.  Cardiovascular: Rapid rate, regular rhythm. Grossly normal heart sounds.  Good peripheral circulation. Respiratory: Normal respiratory effort.  No retractions. Lungs CTAB. Gastrointestinal: Soft and nontender. No distention. No abdominal bruits. No CVA tenderness. Musculoskeletal: No lower extremity tenderness nor edema.  No joint effusions. Neurologic:  Normal speech and language. No gross new focal neurologic deficits are appreciated.  Skin:  See history of present illness Psychiatric: Mood and affect are normal. Speech and behavior are normal.  ____________________________________________   LABS (all labs ordered are listed, but only abnormal results are displayed)  Labs Reviewed  BLOOD CULTURE ID PANEL (REFLEXED) - Abnormal; Notable for the following:       Result Value   Staphylococcus species DETECTED (*)    All other components within normal limits  COMPREHENSIVE METABOLIC PANEL - Abnormal; Notable for the following:    Glucose, Bld 145 (*)    Calcium 8.8 (*)    Total Protein 8.6 (*)    All other components within normal limits  CBC WITH DIFFERENTIAL/PLATELET - Abnormal; Notable for the following:    WBC 34.7 (*)    RDW 15.5 (*)    Platelets 505 (*)    Neutro Abs 33.3 (*)    Lymphs Abs 0.0 (*)    Monocytes Absolute 1.4 (*)    All other components within normal  limits  URINALYSIS, ROUTINE W REFLEX MICROSCOPIC - Abnormal; Notable for the following:    Color, Urine AMBER (*)    APPearance TURBID (*)    Hgb urine dipstick MODERATE (*)    Ketones, ur 5 (*)    Protein, ur 100 (*)    Leukocytes, UA MODERATE (*)    Bacteria, UA FEW (*)    Squamous Epithelial / LPF 0-5 (*)    Non Squamous Epithelial 0-5 (*)    All other components within normal limits  CBC WITH DIFFERENTIAL/PLATELET - Abnormal; Notable for the following:    WBC 26.7 (*)    RBC 3.76 (*)    Hemoglobin 10.8 (*)    HCT 32.9 (*)    RDW 15.4 (*)    Neutro Abs 24.6 (*)    All other components within normal limits  COMPREHENSIVE METABOLIC PANEL - Abnormal; Notable for the following:  Glucose, Bld 103 (*)    Calcium 8.4 (*)    Albumin 2.8 (*)    AST 42 (*)    All other components within normal limits  CBC - Abnormal; Notable for the following:    WBC 24.6 (*)    RBC 3.68 (*)    Hemoglobin 10.2 (*)    HCT 31.9 (*)    RDW 15.6 (*)    All other components within normal limits  GLUCOSE, CAPILLARY - Abnormal; Notable for the following:    Glucose-Capillary 138 (*)    All other components within normal limits  CULTURE, BLOOD (ROUTINE X 2)  CULTURE, BLOOD (ROUTINE X 2)  CULTURE, BLOOD (ROUTINE X 2) W REFLEX TO ID PANEL  CULTURE, BLOOD (ROUTINE X 2) W REFLEX TO ID PANEL  INFLUENZA PANEL BY PCR (TYPE A & B)  LACTIC ACID, PLASMA   ____________________________________________  EKG   EKG read and interpreted by me shows sinus tachycardia rate of 131 left axis nonspecific ST-T wave changes Q's inferiorly and not the best R-wave progression. ____________________________________________  RADIOLOGY   ____________________________________________   PROCEDURES  Procedure(s) performed:  Procedures  Critical Care performed:   ____________________________________________   INITIAL IMPRESSION / ASSESSMENT AND PLAN / ED COURSE  Pertinent labs & imaging results that were  available during my care of the patient were reviewed by me and considered in my medical decision making (see chart for details).        ____________________________________________   FINAL CLINICAL IMPRESSION(S) / ED DIAGNOSES  Final diagnoses:  Sepsis, due to unspecified organism [facility])      NEW MEDICATIONS STARTED DURING THIS VISIT:  Current Discharge Medication List       Note:  This document was prepared using Dragon voice recognition software and may include unintentional dictation errors.    Arnaldo Natal, MD 01/21/17 [phone]

## 2017-01-20 NOTE — Progress Notes (Addendum)
Patient admitted to room 238 from the ED. A&o x4, VSS, no complaints at this time. Heart monitor applied, verified with CCMD. Head to toe skin assessment completed with Serenity K, RN - scattered healing scabs scattered on arms and legs, old pressure sores on left lateral calf and right posterior thigh - covered with gauze/tegaderm. Areas were clean and dry.

## 2017-01-20 NOTE — ED Notes (Signed)
Report attempted to be called by Eileen Stanford, RN.  Floor refused to accept report. Informed that primary nurse and charge nurse were not available.

## 2017-01-20 NOTE — Care Management Note (Signed)
Case Management Note  Patient Details  Name: [name] MRN: [id number] Date of Birth: [birth year]  Subjective/Objective:              Admitted with sepsis due to UTI.History of MS and has in home caregiver support through [employer].  Care referral was coordinated by her [facility] team.  She has "all the equipment I need at home."  Denies issues accessing medical care, obtaining medications.    At present, there is no indication of need for long term IV antibiotics.    Action/Plan:   Expected Discharge Date:                  Expected Discharge Plan:   Home  In-House Referral:     Discharge planning Services     Post Acute Care Choice:    Choice offered to:     DME Arranged:    DME Agency:     HH Arranged:    HH Agency:     Status of Service:     If discussed at [employer] of Stay Meetings, dates discussed:    Additional Comments:  Eber Hong, RN 01/20/2017, 12:23 PM

## 2017-01-21 ENCOUNTER — Encounter: Payer: Self-pay | Admitting: Radiology

## 2017-01-21 ENCOUNTER — Inpatient Hospital Stay: Payer: Medicare Other

## 2017-01-21 LAB — COMPREHENSIVE METABOLIC PANEL
ALBUMIN: 2.8 g/dL — AB (ref 3.5–5.0)
ALK PHOS: 70 U/L (ref 38–126)
ALT: 38 U/L (ref 14–54)
AST: 42 U/L — AB (ref 15–41)
Anion gap: 6 (ref 5–15)
BUN: 8 mg/dL (ref 6–20)
CALCIUM: 8.4 mg/dL — AB (ref 8.9–10.3)
CHLORIDE: 109 mmol/L (ref 101–111)
CO2: 23 mmol/L (ref 22–32)
CREATININE: 0.6 mg/dL (ref 0.44–1.00)
GFR calc Af Amer: >60 mL/min (ref >60)
GFR calc non Af Amer: >60 mL/min (ref >60)
GLUCOSE: 103 mg/dL — AB (ref 65–99)
Potassium: 4.3 mmol/L (ref 3.5–5.1)
SODIUM: 138 mmol/L (ref 135–145)
Total Bilirubin: 0.7 mg/dL (ref 0.3–1.2)
Total Protein: 7.3 g/dL (ref 6.5–8.1)

## 2017-01-21 LAB — CBC
HCT: 31.9 % — ABNORMAL LOW (ref 35.0–47.0)
Hemoglobin: 10.2 g/dL — ABNORMAL LOW (ref 12.0–16.0)
MCH: 27.8 pg (ref 26.0–34.0)
MCHC: 32.2 g/dL (ref 32.0–36.0)
MCV: 86.6 fL (ref 80.0–100.0)
PLATELETS: 379 10*3/uL (ref 150–440)
RBC: 3.68 MIL/uL — AB (ref 3.80–5.20)
RDW: 15.6 % — AB (ref 11.5–14.5)
WBC: 24.6 10*3/uL — ABNORMAL HIGH (ref 3.6–11.0)

## 2017-01-21 LAB — URINALYSIS, COMPLETE (UACMP) WITH MICROSCOPIC
Bilirubin Urine: NEGATIVE
Glucose, UA: NEGATIVE mg/dL
KETONES UR: NEGATIVE mg/dL
Nitrite: NEGATIVE
Protein, ur: NEGATIVE mg/dL
Specific Gravity, Urine: 1.008 (ref 1.005–1.030)
Squamous Epithelial / LPF: NONE SEEN
pH: 6 (ref 5.0–8.0)

## 2017-01-21 MED ORDER — VANCOMYCIN HCL IN DEXTROSE 1-5 GM/200ML-% IV SOLN
1000.0000 mg | Freq: Once | INTRAVENOUS | Status: AC
  Administered 2017-01-21: 1000 mg via INTRAVENOUS
  Filled 2017-01-21: qty 200

## 2017-01-21 MED ORDER — IOPAMIDOL (ISOVUE-300) INJECTION 61%
15.0000 mL | INTRAVENOUS | Status: AC
  Administered 2017-01-21 (×2): 15 mL via ORAL

## 2017-01-21 MED ORDER — IOPAMIDOL (ISOVUE-300) INJECTION 61%
100.0000 mL | Freq: Once | INTRAVENOUS | Status: AC | PRN
  Administered 2017-01-21: 100 mL via INTRAVENOUS

## 2017-01-21 NOTE — Progress Notes (Signed)
PHARMACY - PHYSICIAN COMMUNICATION CRITICAL VALUE ALERT - BLOOD CULTURE IDENTIFICATION (BCID)  Results for orders placed or performed during the hospital encounter of 01/20/17  Blood Culture ID Panel (Reflexed) (Collected: 01/20/2017  3:35 AM)  Result Value Ref Range   Enterococcus species NOT DETECTED NOT DETECTED   Listeria monocytogenes NOT DETECTED NOT DETECTED   Staphylococcus species DETECTED (A) NOT DETECTED   Staphylococcus aureus NOT DETECTED NOT DETECTED   Methicillin resistance NOT DETECTED NOT DETECTED   Streptococcus species NOT DETECTED NOT DETECTED   Streptococcus agalactiae NOT DETECTED NOT DETECTED   Streptococcus pneumoniae NOT DETECTED NOT DETECTED   Streptococcus pyogenes NOT DETECTED NOT DETECTED   Acinetobacter baumannii NOT DETECTED NOT DETECTED   Enterobacteriaceae species NOT DETECTED NOT DETECTED   Enterobacter cloacae complex NOT DETECTED NOT DETECTED   Escherichia coli NOT DETECTED NOT DETECTED   Klebsiella oxytoca NOT DETECTED NOT DETECTED   Klebsiella pneumoniae NOT DETECTED NOT DETECTED   Proteus species NOT DETECTED NOT DETECTED   Serratia marcescens NOT DETECTED NOT DETECTED   Haemophilus influenzae NOT DETECTED NOT DETECTED   Neisseria meningitidis NOT DETECTED NOT DETECTED   Pseudomonas aeruginosa NOT DETECTED NOT DETECTED   Candida albicans NOT DETECTED NOT DETECTED   Candida glabrata NOT DETECTED NOT DETECTED   Candida krusei NOT DETECTED NOT DETECTED   Candida parapsilosis NOT DETECTED NOT DETECTED   Candida tropicalis NOT DETECTED NOT DETECTED    Name of physician (or Provider) Contacted: Pyreddy  Changes to prescribed antibiotics required: Continue current abx (zosyn)  Thomasene Ripple 01/21/2017  4:46 AM

## 2017-01-21 NOTE — Progress Notes (Signed)
Report called to Marchelle Folks, RN on 1C, all questions answered.

## 2017-01-21 NOTE — Progress Notes (Signed)
Patient ID: [name], female   DOB: Dec 17, 1970, 46 y.o.   MRN: [id number]   [facility] PROGRESS NOTE  SHARONNE RICKETTS QIO:[id number] DOB: 07-21-1971 DOA: 01/20/2017 PCP: Gladis Riffle, MD  HPI/Subjective: Patient having abdominal pain in the right side of her abdomen and also near her urostomy site. Some nausea but no vomiting. Some cough. Some shortness breath. Left arm is less swollen today.  Objective: Vitals:   01/21/17 0804 01/21/17 1155  BP: 121/74 127/79  Pulse: 93 82  Resp: 18 16  Temp: 98.2 F (36.8 C) 98.3 F (36.8 C)    Filed Weights   01/20/17 0328  Weight: 90.7 kg (200 lb)    ROS: Review of Systems  Constitutional: Negative for chills and fever.  Eyes: Negative for blurred vision.  Respiratory: Positive for cough. Negative for shortness of breath.   Cardiovascular: Negative for chest pain.  Gastrointestinal: Positive for abdominal pain, nausea and vomiting. Negative for constipation and diarrhea.  Genitourinary: Negative for dysuria.  Musculoskeletal: Negative for joint pain.  Neurological: Negative for dizziness and headaches.   Exam: Physical Exam  Constitutional: She is oriented to person, place, and time.  HENT:  Nose: No mucosal edema.  Mouth/Throat: No oropharyngeal exudate or posterior oropharyngeal edema.  Eyes: Conjunctivae, EOM and lids are normal. Pupils are equal, round, and reactive to light.  Neck: No JVD present. Carotid bruit is not present. No edema present. No thyroid mass and no thyromegaly present.  Cardiovascular: Regular rhythm, S1 normal, S2 normal and normal heart sounds.  Exam reveals no gallop.   No murmur heard. Pulses:      Dorsalis pedis pulses are 2+ on the right side, and 2+ on the left side.  Respiratory: No respiratory distress. She has decreased breath sounds in the right lower field and the left lower field. She has no wheezes. She has no rhonchi. She has no rales.  GI: Soft. Bowel sounds are normal. There is  no tenderness.  Musculoskeletal:       Left elbow: She exhibits swelling.       Left wrist: She exhibits swelling.       Right ankle: She exhibits no swelling.       Left ankle: She exhibits no swelling.  Lymphadenopathy:    She has no cervical adenopathy.  Neurological: She is alert and oriented to person, place, and time.  Patient only able to wiggle toes on her lower extremities.  Skin: Skin is warm. No rash noted. Nails show no clubbing.  Psychiatric: She has a normal mood and affect.      Data Reviewed: Basic Metabolic Panel:  Recent Labs Lab 01/20/17 0335 01/21/17 0443  NA 137 138  K 3.9 4.3  CL 101 109  CO2 24 23  GLUCOSE 145* 103*  BUN 13 8  CREATININE 0.71 0.60  CALCIUM 8.8* 8.4*   Liver Function Tests:  Recent Labs Lab 01/20/17 0335 01/21/17 0443  AST 19 42*  ALT 24 38  ALKPHOS 77 70  BILITOT 0.9 0.7  PROT 8.6* 7.3  ALBUMIN 3.6 2.8*   CBC:  Recent Labs Lab 01/20/17 0335 01/20/17 0841 01/21/17 0443  WBC 34.7* 26.7* 24.6*  NEUTROABS 33.3* 24.6*  --   HGB 12.8 10.8* 10.2*  HCT 38.3 32.9* 31.9*  MCV 87.8 87.3 86.6  PLT 505* 430 379     Recent Results (from the past 240 hour(s))  Blood Culture (routine x 2)     Status: None (Preliminary result)  Collection Time: 01/20/17  3:35 AM  Result Value Ref Range Status   Specimen Description BLOOD RIGHT ARM  Final   Special Requests   Final    BOTTLES DRAWN AEROBIC AND ANAEROBIC Blood Culture adequate volume   Culture  Setup Time   Final    GRAM POSITIVE COCCI IN BOTH AEROBIC AND ANAEROBIC BOTTLES CRITICAL RESULT CALLED TO, READ BACK BY AND VERIFIED WITH: DAVID BESANTI AT 2322 01/20/17.PMH    Culture GRAM POSITIVE COCCI  Final   Report Status PENDING  Incomplete  Blood Culture (routine x 2)     Status: None (Preliminary result)   Collection Time: 01/20/17  3:35 AM  Result Value Ref Range Status   Specimen Description BLOOD RIGHT FOREARM  Final   Special Requests   Final    BOTTLES DRAWN  AEROBIC AND ANAEROBIC Blood Culture adequate volume   Culture NO GROWTH 1 DAY  Final   Report Status PENDING  Incomplete  Blood Culture ID Panel (Reflexed)     Status: Abnormal   Collection Time: 01/20/17  3:35 AM  Result Value Ref Range Status   Enterococcus species NOT DETECTED NOT DETECTED Final   Listeria monocytogenes NOT DETECTED NOT DETECTED Final   Staphylococcus species DETECTED (A) NOT DETECTED Final    Comment: Methicillin (oxacillin) susceptible coagulase negative staphylococcus. Possible blood culture contaminant (unless isolated from more than one blood culture draw or clinical case suggests pathogenicity). No antibiotic treatment is indicated for blood  culture contaminants. CRITICAL RESULT CALLED TO, READ BACK BY AND VERIFIED WITH: DAVID BESANTI AT 2322 01/20/17.PMH    Staphylococcus aureus NOT DETECTED NOT DETECTED Final   Methicillin resistance NOT DETECTED NOT DETECTED Final   Streptococcus species NOT DETECTED NOT DETECTED Final   Streptococcus agalactiae NOT DETECTED NOT DETECTED Final   Streptococcus pneumoniae NOT DETECTED NOT DETECTED Final   Streptococcus pyogenes NOT DETECTED NOT DETECTED Final   Acinetobacter baumannii NOT DETECTED NOT DETECTED Final   Enterobacteriaceae species NOT DETECTED NOT DETECTED Final   Enterobacter cloacae complex NOT DETECTED NOT DETECTED Final   Escherichia coli NOT DETECTED NOT DETECTED Final   Klebsiella oxytoca NOT DETECTED NOT DETECTED Final   Klebsiella pneumoniae NOT DETECTED NOT DETECTED Final   Proteus species NOT DETECTED NOT DETECTED Final   Serratia marcescens NOT DETECTED NOT DETECTED Final   Haemophilus influenzae NOT DETECTED NOT DETECTED Final   Neisseria meningitidis NOT DETECTED NOT DETECTED Final   Pseudomonas aeruginosa NOT DETECTED NOT DETECTED Final   Candida albicans NOT DETECTED NOT DETECTED Final   Candida glabrata NOT DETECTED NOT DETECTED Final   Candida krusei NOT DETECTED NOT DETECTED Final   Candida  parapsilosis NOT DETECTED NOT DETECTED Final   Candida tropicalis NOT DETECTED NOT DETECTED Final  Culture, blood (Routine X 2) w Reflex to ID Panel     Status: None (Preliminary result)   Collection Time: 01/20/17  8:47 AM  Result Value Ref Range Status   Specimen Description BLOOD LEFT ASSIST CONTROL  Final   Special Requests Blood Culture adequate volume  Final   Culture NO GROWTH 1 DAY  Final   Report Status PENDING  Incomplete  Culture, blood (Routine X 2) w Reflex to ID Panel     Status: None (Preliminary result)   Collection Time: 01/20/17  9:51 AM  Result Value Ref Range Status   Specimen Description BLOOD LEFT WRIST  Final   Special Requests   Final    BOTTLES DRAWN AEROBIC AND ANAEROBIC Blood Culture  results may not be optimal due to an inadequate volume of blood received in culture bottles   Culture NO GROWTH < 24 HOURS  Final   Report Status PENDING  Incomplete     Studies: Dg Chest 2 View  Result Date: 01/20/2017 CLINICAL DATA:  46 year old female with weakness. Chest radiograph dated 07/19/2015 EXAM: CHEST  2 VIEW COMPARISON:  None. FINDINGS: There is shallow inspiration. Minimal right lung base streaky densities, likely atelectatic changes. Pneumonia is not entirely excluded. Clinical correlation is recommended. There is no focal consolidation, pleural effusion, or pneumothorax. The cardiac silhouette is within normal limits. No acute osseous pathology. IMPRESSION: Shallow inspiration with probable minimal right lung base atelectatic changes. No focal consolidation. Electronically Signed   By: Elgie Collard M.D.   On: 01/20/2017 04:48   Ct Abdomen Pelvis W Contrast  Result Date: 01/21/2017 CLINICAL DATA:  Abdominal/pelvic pain on the right. History of UTI, given antibiotics. EXAM: CT ABDOMEN AND PELVIS WITH CONTRAST TECHNIQUE: Multidetector CT imaging of the abdomen and pelvis was performed using the standard protocol following bolus administration of intravenous contrast.  CONTRAST:  ISOVUE-300 IOPAMIDOL (ISOVUE-300) INJECTION 61% COMPARISON:  July 19, 2015 FINDINGS: Lower chest: There is a small right pleural effusion which is new in the interval. There is also infiltrate in the right lung base suspicious for pneumonia or aspiration. Atelectasis is also possibility. Mild atelectasis in the left base. There is a small hiatal hernia. No other abnormalities in the lung bases. Hepatobiliary: High attenuation dependently in the gallbladder is likely stones or sludge. The gallbladder is otherwise normal in appearance. No focal liver lesions are identified. The portal vein is patent. Pancreas: Unremarkable. No pancreatic ductal dilatation or surrounding inflammatory changes. Spleen: Normal in size without focal abnormality. 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 The ileal diversion loop is largely normal in appearance. However, there is mild wall thickening as the loop exits the abdomen into the right lower quadrant ostomy as seen on series 2, image 59 which could be secondary to inflammation. This was not seen previously. There is no adjacent fat stranding. The left kidney is normal in appearance with no stones, masses, hydronephrosis, or perinephric stranding. No stones seen along the course of left ureter. The right kidney demonstrates altered enhancement relative to the left with mild adjacent fat stranding. This is particularly seen on series 2, image 19 and is also well appreciated on delayed images such as on series 4, image 22. The findings are suspicious for pyelonephritis. An 8 mm region of focal decreased attenuation on series 2, image 19 is nonspecific. The earliest beginnings of a developing abscess are not completely excluded. No right ureterectasis or ureteral stone. Stomach/Bowel: There is a small hiatal hernia. The stomach is otherwise normal. The small bowel is non dilated with no  significant obstruction. A loop of small bowel is herniated into the right lower quadrant ostomy site. The herniated loops of bowel are not thick walled and demonstrate no dilatation. There is contrast in the small bowel both proximal and distal to the herniated loop. Fecal loading is seen throughout the colon. The colon is otherwise normal in appearance. The appendix is not well seen but there is no secondary evidence of appendicitis. Vascular/Lymphatic: The abdominal aorta and its branching vessels are normal in appearance and caliber. Shotty mildly prominent nodes are seen in the upper abdomen such as in the portacaval region on image 25 and anterior to the  IVC on image 36. No adenopathy in the pelvis. Reproductive: Status post hysterectomy. No adnexal masses. Other: There is a tiny amount of free fluid in the pelvis, likely reactive. No free air. There is a fat containing umbilical hernia. Musculoskeletal: Pedicle screws are seen in the lower lumbar spine. Hardware is intact. No other acute bony abnormalities. IMPRESSION: 1. Findings consistent with right-sided pyelonephritis. A tiny rounded region of focal decreased attenuation in the periphery of the right kidney on series 2, image 19 measures up to 8 mm. A small phlegmon or the earliest beginnings of a small abscess are not excluded on this single study. Recommend attention to this region on short-term follow-up. 2. Mild thickening in the wall of the distal ileal diversion loop is nonspecific but could represent mild inflammation. 3. Herniated loop of small bowel into the right lower quadrant ostomy with no evidence of bowel obstruction. 4. Shotty nodes in the upper abdomen as above are probably reactive due to the probable pyelonephritis. 5. Small right-sided pleural effusion. Right lower lobe infiltrate is worrisome for pneumonia. Atelectasis is possible. Recommend clinical correlation and attention on follow-up. 6. Stones or sludge in the gallbladder.  These results will be called to the ordering clinician or representative by the Radiologist Assistant, and communication documented in the PACS or [employer] Dashboard. Electronically Signed   By: Gerome Sam III M.D   On: 01/21/2017 14:06   US Venous Img Upper Uni Left  Result Date: 01/20/2017 CLINICAL DATA:  Left upper extremity swelling x1 day EXAM: LEFT UPPER EXTREMITY VENOUS DOPPLER ULTRASOUND TECHNIQUE: Gray-scale sonography with graded compression, as well as color Doppler and duplex ultrasound were performed to evaluate the upper extremity deep venous system from the level of the subclavian vein and including the jugular, axillary, basilic, radial, ulnar and upper cephalic vein. Spectral Doppler was utilized to evaluate flow at rest and with distal augmentation maneuvers. COMPARISON:  None. FINDINGS: Contralateral Subclavian Vein: Respiratory phasicity is normal and symmetric with the symptomatic side. No evidence of thrombus. Normal compressibility. Internal Jugular Vein: No evidence of thrombus. Normal compressibility, respiratory phasicity and response to augmentation. Subclavian Vein: No evidence of thrombus. Normal compressibility, respiratory phasicity and response to augmentation. Axillary Vein: No evidence of thrombus. Normal compressibility, respiratory phasicity and response to augmentation. Cephalic Vein: No evidence of thrombus. Normal compressibility, respiratory phasicity and response to augmentation. Basilic Vein: No evidence of thrombus. Normal compressibility, respiratory phasicity and response to augmentation. Brachial Veins: No evidence of thrombus. Normal compressibility, respiratory phasicity and response to augmentation. Radial Veins: No evidence of thrombus. Normal compressibility, respiratory phasicity and response to augmentation. Ulnar Veins: No evidence of thrombus. Normal compressibility, respiratory phasicity and response to augmentation. Venous Reflux:  None visualized.  Other Findings:  None visualized. IMPRESSION: No deep venous thrombosis in the visualized left upper extremity. Electronically Signed   By: Charline Bills M.D.   On: 01/20/2017 16:44    Scheduled Meds: . amantadine  100 mg Oral BID  . baclofen  20 mg Oral TID  . docusate sodium  100 mg Oral BID  . DULoxetine  30 mg Oral BID  . enoxaparin (LOVENOX) injection  40 mg Subcutaneous Q24H  . gabapentin  800 mg Oral QID  . sodium chloride flush  3 mL Intravenous Q12H   Continuous Infusions: . piperacillin-tazobactam (ZOSYN)  IV 3.375 g (01/21/17 1324)    Assessment/Plan:  1. Clinical sepsis secondary to Pyelonephritis and abdominal pain. Unfortunately no urine culture was sent on presentation. This condition was  present on admission. Urine culture ordered today. Urine analysis still positive today. CT scan of the abdomen consistent with pyelonephritis. Patient has a history of neurogenic bladder and a urostomy. This will increase her risk for infection. Patient with fever, tachycardia and leukocytosis. Continue Zosyn. Patient also received a dose of vancomycin in the ER. Temperature curve trending better but still with leukocytosis. 2. Left arm swollen and only slight erythema. No DVT seen on ultrasound 3. Multiple sclerosis. Patient is bedbound and has chronic pain. Restarted amantadine, baclofen, gabapentin. 4. History of depression on Cymbalta  Code Status:     Code Status Orders        Start     Ordered   01/20/17 0601  Full code  Continuous     01/20/17 0603    Code Status History    Date Active Date Inactive Code Status Order ID Comments User Context   01/12/2016  4:48 PM 01/15/2016  6:24 PM Full Code [id number]  Houston Siren, MD ED   08/09/2015  5:24 PM 08/12/2015  9:35 PM Full Code [id number]  Marguarite Arbour, MD Inpatient   07/19/2015  2:54 PM 07/25/2015  1:51 PM Full Code [id number]  Gladis Riffle, MD Inpatient     Disposition Plan: Home once doing better with  regards to infection.  Antibiotics:  Zosyn  Time spent: 28 minutes  Alford Highland  [employer]

## 2017-01-22 LAB — BASIC METABOLIC PANEL
ANION GAP: 8 (ref 5–15)
BUN: 7 mg/dL (ref 6–20)
CALCIUM: 8.5 mg/dL — AB (ref 8.9–10.3)
CO2: 26 mmol/L (ref 22–32)
Chloride: 101 mmol/L (ref 101–111)
Creatinine, Ser: 0.57 mg/dL (ref 0.44–1.00)
Glucose, Bld: 101 mg/dL — ABNORMAL HIGH (ref 65–99)
Potassium: 3.9 mmol/L (ref 3.5–5.1)
Sodium: 135 mmol/L (ref 135–145)

## 2017-01-22 LAB — URINE CULTURE: Culture: NO GROWTH

## 2017-01-22 LAB — CBC
HEMATOCRIT: 33.7 % — AB (ref 35.0–47.0)
Hemoglobin: 11.2 g/dL — ABNORMAL LOW (ref 12.0–16.0)
MCH: 28.7 pg (ref 26.0–34.0)
MCHC: 33.3 g/dL (ref 32.0–36.0)
MCV: 86.2 fL (ref 80.0–100.0)
PLATELETS: 423 10*3/uL (ref 150–440)
RBC: 3.91 MIL/uL (ref 3.80–5.20)
RDW: 15.7 % — AB (ref 11.5–14.5)
WBC: 12.9 10*3/uL — AB (ref 3.6–11.0)

## 2017-01-22 MED ORDER — LACTULOSE 10 GM/15ML PO SOLN
30.0000 g | Freq: Once | ORAL | Status: AC
Start: 2017-01-22 — End: 2017-01-22
  Administered 2017-01-22: 30 g via ORAL
  Filled 2017-01-22: qty 60

## 2017-01-22 NOTE — Progress Notes (Signed)
Patient ID: [name], female   DOB: [birth year], 46 y.o.   MRN: [id number]   [facility] PROGRESS NOTE  SILVA SCHOPP [name]:[id number] DOB: 04/04/1971 DOA: 01/20/2017 PCP: Gladis Riffle, MD  HPI/Subjective: Patient having abdominal pain in the right side of her abdomen and also near her urostomy site. Some constipation. Nausea has improved. Having some pain in her upper legs.  Objective: Vitals:   01/22/17 0536 01/22/17 1326  BP: 140/79 129/75  Pulse: 95 (!) 104  Resp: 18 20  Temp: 98.2 F (36.8 C) 97.8 F (36.6 C)    Filed Weights   01/20/17 0328  Weight: 90.7 kg (200 lb)    ROS: Review of Systems  Constitutional: Negative for chills and fever.  Eyes: Negative for blurred vision.  Respiratory: Positive for cough. Negative for shortness of breath.   Cardiovascular: Negative for chest pain.  Gastrointestinal: Positive for abdominal pain and constipation. Negative for diarrhea, nausea and vomiting.  Genitourinary: Negative for dysuria.  Musculoskeletal: Negative for joint pain.  Neurological: Negative for dizziness and headaches.   Exam: Physical Exam  Constitutional: She is oriented to person, place, and time.  HENT:  Nose: No mucosal edema.  Mouth/Throat: No oropharyngeal exudate or posterior oropharyngeal edema.  Eyes: Conjunctivae, EOM and lids are normal. Pupils are equal, round, and reactive to light.  Neck: No JVD present. Carotid bruit is not present. No edema present. No thyroid mass and no thyromegaly present.  Cardiovascular: Regular rhythm, S1 normal, S2 normal and normal heart sounds.  Exam reveals no gallop.   No murmur heard. Pulses:      Dorsalis pedis pulses are 2+ on the right side, and 2+ on the left side.  Respiratory: No respiratory distress. She has decreased breath sounds in the right lower field and the left lower field. She has no wheezes. She has no rhonchi. She has no rales.  GI: Soft. Bowel sounds are normal. There is no tenderness.   Musculoskeletal:       Left elbow: She exhibits swelling.       Left wrist: She exhibits swelling.       Right ankle: She exhibits no swelling.       Left ankle: She exhibits no swelling.  Lymphadenopathy:    She has no cervical adenopathy.  Neurological: She is alert and oriented to person, place, and time.  Patient only able to wiggle toes on her lower extremities.  Skin: Skin is warm. No rash noted. Nails show no clubbing.  Psychiatric: She has a normal mood and affect.      Data Reviewed: Basic Metabolic Panel:  Recent Labs Lab 01/20/17 0335 01/21/17 0443 01/22/17 0750  NA 137 138 135  K 3.9 4.3 3.9  CL 101 109 101  CO2 24 23 26   GLUCOSE 145* 103* 101*  BUN 13 8 7   CREATININE 0.71 0.60 0.57  CALCIUM 8.8* 8.4* 8.5*   Liver Function Tests:  Recent Labs Lab 01/20/17 0335 01/21/17 0443  AST 19 42*  ALT 24 38  ALKPHOS 77 70  BILITOT 0.9 0.7  PROT 8.6* 7.3  ALBUMIN 3.6 2.8*   CBC:  Recent Labs Lab 01/20/17 0335 01/20/17 0841 01/21/17 0443 01/22/17 0750  WBC 34.7* 26.7* 24.6* 12.9*  NEUTROABS 33.3* 24.6*  --   --   HGB 12.8 10.8* 10.2* 11.2*  HCT 38.3 32.9* 31.9* 33.7*  MCV 87.8 87.3 86.6 86.2  PLT 505* 430 379 423     Recent Results (from the past  240 hour(s))  Blood Culture (routine x 2)     Status: Abnormal (Preliminary result)   Collection Time: 01/20/17  3:35 AM  Result Value Ref Range Status   Specimen Description BLOOD RIGHT ARM  Final   Special Requests   Final    BOTTLES DRAWN AEROBIC AND ANAEROBIC Blood Culture adequate volume   Culture  Setup Time   Final    GRAM POSITIVE COCCI IN BOTH AEROBIC AND ANAEROBIC BOTTLES CRITICAL RESULT CALLED TO, READ BACK BY AND VERIFIED WITH: DAVID BESANTI AT 2322 01/20/17.PMH    Culture STAPHYLOCOCCUS SPECIES (COAGULASE NEGATIVE) (A)  Final   Report Status PENDING  Incomplete  Blood Culture (routine x 2)     Status: None (Preliminary result)   Collection Time: 01/20/17  3:35 AM  Result Value Ref  Range Status   Specimen Description BLOOD RIGHT FOREARM  Final   Special Requests   Final    BOTTLES DRAWN AEROBIC AND ANAEROBIC Blood Culture adequate volume   Culture NO GROWTH 2 DAYS  Final   Report Status PENDING  Incomplete  Blood Culture ID Panel (Reflexed)     Status: Abnormal   Collection Time: 01/20/17  3:35 AM  Result Value Ref Range Status   Enterococcus species NOT DETECTED NOT DETECTED Final   Listeria monocytogenes NOT DETECTED NOT DETECTED Final   Staphylococcus species DETECTED (A) NOT DETECTED Final    Comment: Methicillin (oxacillin) susceptible coagulase negative staphylococcus. Possible blood culture contaminant (unless isolated from more than one blood culture draw or clinical case suggests pathogenicity). No antibiotic treatment is indicated for blood  culture contaminants. CRITICAL RESULT CALLED TO, READ BACK BY AND VERIFIED WITH: DAVID BESANTI AT 2322 01/20/17.PMH    Staphylococcus aureus NOT DETECTED NOT DETECTED Final   Methicillin resistance NOT DETECTED NOT DETECTED Final   Streptococcus species NOT DETECTED NOT DETECTED Final   Streptococcus agalactiae NOT DETECTED NOT DETECTED Final   Streptococcus pneumoniae NOT DETECTED NOT DETECTED Final   Streptococcus pyogenes NOT DETECTED NOT DETECTED Final   Acinetobacter baumannii NOT DETECTED NOT DETECTED Final   Enterobacteriaceae species NOT DETECTED NOT DETECTED Final   Enterobacter cloacae complex NOT DETECTED NOT DETECTED Final   Escherichia coli NOT DETECTED NOT DETECTED Final   Klebsiella oxytoca NOT DETECTED NOT DETECTED Final   Klebsiella pneumoniae NOT DETECTED NOT DETECTED Final   Proteus species NOT DETECTED NOT DETECTED Final   Serratia marcescens NOT DETECTED NOT DETECTED Final   Haemophilus influenzae NOT DETECTED NOT DETECTED Final   Neisseria meningitidis NOT DETECTED NOT DETECTED Final   Pseudomonas aeruginosa NOT DETECTED NOT DETECTED Final   Candida albicans NOT DETECTED NOT DETECTED Final    Candida glabrata NOT DETECTED NOT DETECTED Final   Candida krusei NOT DETECTED NOT DETECTED Final   Candida parapsilosis NOT DETECTED NOT DETECTED Final   Candida tropicalis NOT DETECTED NOT DETECTED Final  Culture, blood (Routine X 2) w Reflex to ID Panel     Status: None (Preliminary result)   Collection Time: 01/20/17  8:47 AM  Result Value Ref Range Status   Specimen Description BLOOD LEFT ASSIST CONTROL  Final   Special Requests Blood Culture adequate volume  Final   Culture NO GROWTH 2 DAYS  Final   Report Status PENDING  Incomplete  Culture, blood (Routine X 2) w Reflex to ID Panel     Status: None (Preliminary result)   Collection Time: 01/20/17  9:51 AM  Result Value Ref Range Status   Specimen Description BLOOD LEFT  WRIST  Final   Special Requests   Final    BOTTLES DRAWN AEROBIC AND ANAEROBIC Blood Culture results may not be optimal due to an inadequate volume of blood received in culture bottles   Culture NO GROWTH 2 DAYS  Final   Report Status PENDING  Incomplete     Studies: Ct Abdomen Pelvis W Contrast  Result Date: 01/21/2017 CLINICAL DATA:  Abdominal/pelvic pain on the right. History of UTI, given antibiotics. EXAM: CT ABDOMEN AND PELVIS WITH CONTRAST TECHNIQUE: Multidetector CT imaging of the abdomen and pelvis was performed using the standard protocol following bolus administration of intravenous contrast. CONTRAST:  ISOVUE-300 IOPAMIDOL (ISOVUE-300) INJECTION 61% COMPARISON:  July 19, 2015 FINDINGS: Lower chest: There is a small right pleural effusion which is new in the interval. There is also infiltrate in the right lung base suspicious for pneumonia or aspiration. Atelectasis is also possibility. Mild atelectasis in the left base. There is a small hiatal hernia. No other abnormalities in the lung bases. Hepatobiliary: High attenuation dependently in the gallbladder is likely stones or sludge. The gallbladder is otherwise normal in appearance. No focal liver  lesions are identified. The portal vein is patent. Pancreas: Unremarkable. No pancreatic ductal dilatation or surrounding inflammatory changes. Spleen: Normal in size without focal abnormality. 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 The ileal diversion loop is largely normal in appearance. However, there is mild wall thickening as the loop exits the abdomen into the right lower quadrant ostomy as seen on series 2, image 59 which could be secondary to inflammation. This was not seen previously. There is no adjacent fat stranding. The left kidney is normal in appearance with no stones, masses, hydronephrosis, or perinephric stranding. No stones seen along the course of left ureter. The right kidney demonstrates altered enhancement relative to the left with mild adjacent fat stranding. This is particularly seen on series 2, image 19 and is also well appreciated on delayed images such as on series 4, image 22. The findings are suspicious for pyelonephritis. An 8 mm region of focal decreased attenuation on series 2, image 19 is nonspecific. The earliest beginnings of a developing abscess are not completely excluded. No right ureterectasis or ureteral stone. Stomach/Bowel: There is a small hiatal hernia. The stomach is otherwise normal. The small bowel is non dilated with no significant obstruction. A loop of small bowel is herniated into the right lower quadrant ostomy site. The herniated loops of bowel are not thick walled and demonstrate no dilatation. There is contrast in the small bowel both proximal and distal to the herniated loop. Fecal loading is seen throughout the colon. The colon is otherwise normal in appearance. The appendix is not well seen but there is no secondary evidence of appendicitis. Vascular/Lymphatic: The abdominal aorta and its branching vessels are normal in appearance and caliber. Shotty mildly prominent nodes are seen  in the upper abdomen such as in the portacaval region on image 25 and anterior to the IVC on image 36. No adenopathy in the pelvis. Reproductive: Status post hysterectomy. No adnexal masses. Other: There is a tiny amount of free fluid in the pelvis, likely reactive. No free air. There is a fat containing umbilical hernia. Musculoskeletal: Pedicle screws are seen in the lower lumbar spine. Hardware is intact. No other acute bony abnormalities. IMPRESSION: 1. Findings consistent with right-sided pyelonephritis. A tiny rounded region of focal decreased attenuation in the periphery of the right kidney on  series 2, image 19 measures up to 8 mm. A small phlegmon or the earliest beginnings of a small abscess are not excluded on this single study. Recommend attention to this region on short-term follow-up. 2. Mild thickening in the wall of the distal ileal diversion loop is nonspecific but could represent mild inflammation. 3. Herniated loop of small bowel into the right lower quadrant ostomy with no evidence of bowel obstruction. 4. Shotty nodes in the upper abdomen as above are probably reactive due to the probable pyelonephritis. 5. Small right-sided pleural effusion. Right lower lobe infiltrate is worrisome for pneumonia. Atelectasis is possible. Recommend clinical correlation and attention on follow-up. 6. Stones or sludge in the gallbladder. These results will be called to the ordering clinician or representative by the Radiologist Assistant, and communication documented in the PACS or [employer] Dashboard. Electronically Signed   By: Gerome Sam III M.D   On: 01/21/2017 14:06   US Venous Img Upper Uni Left  Result Date: 01/20/2017 CLINICAL DATA:  Left upper extremity swelling x1 day EXAM: LEFT UPPER EXTREMITY VENOUS DOPPLER ULTRASOUND TECHNIQUE: Gray-scale sonography with graded compression, as well as color Doppler and duplex ultrasound were performed to evaluate the upper extremity deep venous system from the  level of the subclavian vein and including the jugular, axillary, basilic, radial, ulnar and upper cephalic vein. Spectral Doppler was utilized to evaluate flow at rest and with distal augmentation maneuvers. COMPARISON:  None. FINDINGS: Contralateral Subclavian Vein: Respiratory phasicity is normal and symmetric with the symptomatic side. No evidence of thrombus. Normal compressibility. Internal Jugular Vein: No evidence of thrombus. Normal compressibility, respiratory phasicity and response to augmentation. Subclavian Vein: No evidence of thrombus. Normal compressibility, respiratory phasicity and response to augmentation. Axillary Vein: No evidence of thrombus. Normal compressibility, respiratory phasicity and response to augmentation. Cephalic Vein: No evidence of thrombus. Normal compressibility, respiratory phasicity and response to augmentation. Basilic Vein: No evidence of thrombus. Normal compressibility, respiratory phasicity and response to augmentation. Brachial Veins: No evidence of thrombus. Normal compressibility, respiratory phasicity and response to augmentation. Radial Veins: No evidence of thrombus. Normal compressibility, respiratory phasicity and response to augmentation. Ulnar Veins: No evidence of thrombus. Normal compressibility, respiratory phasicity and response to augmentation. Venous Reflux:  None visualized. Other Findings:  None visualized. IMPRESSION: No deep venous thrombosis in the visualized left upper extremity. Electronically Signed   By: Charline Bills M.D.   On: 01/20/2017 16:44    Scheduled Meds: . amantadine  100 mg Oral BID  . baclofen  20 mg Oral TID  . docusate sodium  100 mg Oral BID  . DULoxetine  30 mg Oral BID  . enoxaparin (LOVENOX) injection  40 mg Subcutaneous Q24H  . gabapentin  800 mg Oral QID  . sodium chloride flush  3 mL Intravenous Q12H   Continuous Infusions: . piperacillin-tazobactam (ZOSYN)  IV 3.375 g (01/22/17 1135)     Assessment/Plan:  1. Clinical sepsis secondary to Pyelonephritis and abdominal pain. Unfortunately no urine culture was sent on presentation. This condition was present on admission. Urine culture ordered yesterday. CT scan of the abdomen consistent with pyelonephritis. Patient has a history of neurogenic bladder and a urostomy. This will increase her risk for infection. Patient with fever, tachycardia and leukocytosis. Continue Zosyn. Patient also received a dose of vancomycin in the ER. Temperature curve and white blood cell count trending better. 2. Left arm swollen and only slight erythema. No DVT seen on ultrasound 3. Multiple sclerosis. Patient is bedbound and has chronic  pain. Restarted amantadine, baclofen, gabapentin. 4. History of depression on Cymbalta 5. Constipation. Give a dose of lactulose.  Code Status:     Code Status Orders        Start     Ordered   01/20/17 0601  Full code  Continuous     01/20/17 0603    Code Status History    Date Active Date Inactive Code Status Order ID Comments User Context   01/12/2016  4:48 PM 01/15/2016  6:24 PM Full Code [id number]  Houston Siren, MD ED   08/09/2015  5:24 PM 08/12/2015  9:35 PM Full Code [id number]  Marguarite Arbour, MD Inpatient   07/19/2015  2:54 PM 07/25/2015  1:51 PM Full Code [id number]  Gladis Riffle, MD Inpatient     Disposition Plan: Home once doing better with regards to infection.  Antibiotics:  Zosyn  Time spent: 24 minutes  Alford Highland  [employer]

## 2017-01-23 ENCOUNTER — Inpatient Hospital Stay: Payer: Medicare Other

## 2017-01-23 LAB — CULTURE, BLOOD (ROUTINE X 2): SPECIAL REQUESTS: ADEQUATE

## 2017-01-23 LAB — CBC
HEMATOCRIT: 33.5 % — AB (ref 35.0–47.0)
HEMOGLOBIN: 11.4 g/dL — AB (ref 12.0–16.0)
MCH: 29.6 pg (ref 26.0–34.0)
MCHC: 34 g/dL (ref 32.0–36.0)
MCV: 87.3 fL (ref 80.0–100.0)
Platelets: 443 10*3/uL — ABNORMAL HIGH (ref 150–440)
RBC: 3.84 MIL/uL (ref 3.80–5.20)
RDW: 15.3 % — AB (ref 11.5–14.5)
WBC: 11.5 10*3/uL — ABNORMAL HIGH (ref 3.6–11.0)

## 2017-01-23 MED ORDER — FLEET ENEMA 7-19 GM/118ML RE ENEM
1.0000 | ENEMA | Freq: Every day | RECTAL | Status: DC | PRN
  Administered 2017-01-24: 1 via RECTAL
  Filled 2017-01-23: qty 1

## 2017-01-23 MED ORDER — LACTULOSE 10 GM/15ML PO SOLN
30.0000 g | Freq: Two times a day (BID) | ORAL | Status: DC
  Administered 2017-01-23 – 2017-01-24 (×3): 30 g via ORAL
  Filled 2017-01-23 (×3): qty 60

## 2017-01-23 MED ORDER — BISACODYL 10 MG RE SUPP
10.0000 mg | Freq: Every day | RECTAL | Status: DC | PRN
  Administered 2017-01-23: 15:00:00 10 mg via RECTAL
  Filled 2017-01-23: qty 1

## 2017-01-23 NOTE — Plan of Care (Signed)
Problem: Bowel/Gastric: Goal: Will not experience complications related to bowel motility Outcome: Not Progressing Patient has not had a BM since 01/19/2017. Laxative ordered.

## 2017-01-23 NOTE — Progress Notes (Signed)
Patient ID: [name], female   DOB: November 10, 1970, 46 y.o.   MRN: [id number]    [facility] PROGRESS NOTE  MARVIS BAKKEN [name]:[id number] DOB: 1971-01-26 DOA: 01/20/2017 PCP: Gladis Riffle, MD  HPI/Subjective: Patient feeling abdominal distention. Has not had a bowel movement. Had a slight fever last night. Still having some abdominal pain at her urostomy site.  Objective: Vitals:   01/23/17 0432 01/23/17 1320  BP: 127/74 113/66  Pulse: 97 (!) 103  Resp: 18 18  Temp: 98.9 F (37.2 C) 98.6 F (37 C)    Filed Weights   01/20/17 0328  Weight: 90.7 kg (200 lb)    ROS: Review of Systems  Constitutional: Negative for chills and fever.  Eyes: Negative for blurred vision.  Respiratory: Positive for cough. Negative for shortness of breath.   Cardiovascular: Negative for chest pain.  Gastrointestinal: Positive for abdominal pain and constipation. Negative for diarrhea, nausea and vomiting.  Genitourinary: Negative for dysuria.  Musculoskeletal: Negative for joint pain.  Neurological: Negative for dizziness and headaches.   Exam: Physical Exam  Constitutional: She is oriented to person, place, and time.  HENT:  Nose: No mucosal edema.  Mouth/Throat: No oropharyngeal exudate or posterior oropharyngeal edema.  Eyes: Conjunctivae, EOM and lids are normal. Pupils are equal, round, and reactive to light.  Neck: No JVD present. Carotid bruit is not present. No edema present. No thyroid mass and no thyromegaly present.  Cardiovascular: Regular rhythm, S1 normal, S2 normal and normal heart sounds.  Exam reveals no gallop.   No murmur heard. Pulses:      Dorsalis pedis pulses are 2+ on the right side, and 2+ on the left side.  Respiratory: No respiratory distress. She has decreased breath sounds in the right lower field and the left lower field. She has no wheezes. She has no rhonchi. She has no rales.  GI: Soft. Bowel sounds are normal. There is no tenderness.  Musculoskeletal:        Left elbow: She exhibits swelling.       Left wrist: She exhibits swelling.       Right ankle: She exhibits no swelling.       Left ankle: She exhibits no swelling.  Lymphadenopathy:    She has no cervical adenopathy.  Neurological: She is alert and oriented to person, place, and time.  Patient only able to wiggle toes on her lower extremities.  Skin: Skin is warm. No rash noted. Nails show no clubbing.  Psychiatric: She has a normal mood and affect.      Data Reviewed: Basic Metabolic Panel:  Recent Labs Lab 01/20/17 0335 01/21/17 0443 01/22/17 0750  NA 137 138 135  K 3.9 4.3 3.9  CL 101 109 101  CO2 24 23 26   GLUCOSE 145* 103* 101*  BUN 13 8 7   CREATININE 0.71 0.60 0.57  CALCIUM 8.8* 8.4* 8.5*   Liver Function Tests:  Recent Labs Lab 01/20/17 0335 01/21/17 0443  AST 19 42*  ALT 24 38  ALKPHOS 77 70  BILITOT 0.9 0.7  PROT 8.6* 7.3  ALBUMIN 3.6 2.8*   CBC:  Recent Labs Lab 01/20/17 0335 01/20/17 0841 01/21/17 0443 01/22/17 0750 01/23/17 0421  WBC 34.7* 26.7* 24.6* 12.9* 11.5*  NEUTROABS 33.3* 24.6*  --   --   --   HGB 12.8 10.8* 10.2* 11.2* 11.4*  HCT 38.3 32.9* 31.9* 33.7* 33.5*  MCV 87.8 87.3 86.6 86.2 87.3  PLT 505* 430 379 423 443*  Recent Results (from the past 240 hour(s))  Blood Culture (routine x 2)     Status: Abnormal (Preliminary result)   Collection Time: 01/20/17  3:35 AM  Result Value Ref Range Status   Specimen Description BLOOD RIGHT ARM  Final   Special Requests   Final    BOTTLES DRAWN AEROBIC AND ANAEROBIC Blood Culture adequate volume   Culture  Setup Time   Final    GRAM POSITIVE COCCI IN BOTH AEROBIC AND ANAEROBIC BOTTLES CRITICAL RESULT CALLED TO, READ BACK BY AND VERIFIED WITH: DAVID BESANTI AT 2322 01/20/17.PMH    Culture STAPHYLOCOCCUS SPECIES (COAGULASE NEGATIVE) (A)  Final   Report Status PENDING  Incomplete  Blood Culture (routine x 2)     Status: None (Preliminary result)   Collection Time: 01/20/17   3:35 AM  Result Value Ref Range Status   Specimen Description BLOOD RIGHT FOREARM  Final   Special Requests   Final    BOTTLES DRAWN AEROBIC AND ANAEROBIC Blood Culture adequate volume   Culture NO GROWTH 3 DAYS  Final   Report Status PENDING  Incomplete  Blood Culture ID Panel (Reflexed)     Status: Abnormal   Collection Time: 01/20/17  3:35 AM  Result Value Ref Range Status   Enterococcus species NOT DETECTED NOT DETECTED Final   Listeria monocytogenes NOT DETECTED NOT DETECTED Final   Staphylococcus species DETECTED (A) NOT DETECTED Final    Comment: Methicillin (oxacillin) susceptible coagulase negative staphylococcus. Possible blood culture contaminant (unless isolated from more than one blood culture draw or clinical case suggests pathogenicity). No antibiotic treatment is indicated for blood  culture contaminants. CRITICAL RESULT CALLED TO, READ BACK BY AND VERIFIED WITH: DAVID BESANTI AT 2322 01/20/17.PMH    Staphylococcus aureus NOT DETECTED NOT DETECTED Final   Methicillin resistance NOT DETECTED NOT DETECTED Final   Streptococcus species NOT DETECTED NOT DETECTED Final   Streptococcus agalactiae NOT DETECTED NOT DETECTED Final   Streptococcus pneumoniae NOT DETECTED NOT DETECTED Final   Streptococcus pyogenes NOT DETECTED NOT DETECTED Final   Acinetobacter baumannii NOT DETECTED NOT DETECTED Final   Enterobacteriaceae species NOT DETECTED NOT DETECTED Final   Enterobacter cloacae complex NOT DETECTED NOT DETECTED Final   Escherichia coli NOT DETECTED NOT DETECTED Final   Klebsiella oxytoca NOT DETECTED NOT DETECTED Final   Klebsiella pneumoniae NOT DETECTED NOT DETECTED Final   Proteus species NOT DETECTED NOT DETECTED Final   Serratia marcescens NOT DETECTED NOT DETECTED Final   Haemophilus influenzae NOT DETECTED NOT DETECTED Final   Neisseria meningitidis NOT DETECTED NOT DETECTED Final   Pseudomonas aeruginosa NOT DETECTED NOT DETECTED Final   Candida albicans NOT  DETECTED NOT DETECTED Final   Candida glabrata NOT DETECTED NOT DETECTED Final   Candida krusei NOT DETECTED NOT DETECTED Final   Candida parapsilosis NOT DETECTED NOT DETECTED Final   Candida tropicalis NOT DETECTED NOT DETECTED Final  Culture, blood (Routine X 2) w Reflex to ID Panel     Status: None (Preliminary result)   Collection Time: 01/20/17  8:47 AM  Result Value Ref Range Status   Specimen Description BLOOD LEFT ASSIST CONTROL  Final   Special Requests Blood Culture adequate volume  Final   Culture NO GROWTH 3 DAYS  Final   Report Status PENDING  Incomplete  Culture, blood (Routine X 2) w Reflex to ID Panel     Status: None (Preliminary result)   Collection Time: 01/20/17  9:51 AM  Result Value Ref Range Status  Specimen Description BLOOD LEFT WRIST  Final   Special Requests   Final    BOTTLES DRAWN AEROBIC AND ANAEROBIC Blood Culture results may not be optimal due to an inadequate volume of blood received in culture bottles   Culture NO GROWTH 3 DAYS  Final   Report Status PENDING  Incomplete  Urine culture     Status: None   Collection Time: 01/21/17  1:30 PM  Result Value Ref Range Status   Specimen Description URINE, RANDOM  Final   Special Requests NONE  Final   Culture   Final    NO GROWTH Performed at [facility], [address], [place], [place] [address]    Report Status 01/22/2017 FINAL  Final     Studies: Dg Abd 2 Views  Result Date: 01/23/2017 CLINICAL DATA:  Right sided abdominal pain, especially near urostomy site, and constipation x2 days. EXAM: ABDOMEN - 2 VIEW COMPARISON:  CT of the abdomen and pelvis on 01/21/2017 FINDINGS: There is gaseous dilatation of large bowel loops, increased since prior exam. Moderate stool within the descending colon go and cecum. No evidence for free intraperitoneal air. No evidence for small bowel obstruction. Status post lumbar fusion. IMPRESSION: Increased gaseous distension of large bowel loops. Moderate stool  burden. Electronically Signed   By: Norva Pavlov M.D.   On: 01/23/2017 11:58    Scheduled Meds: . amantadine  100 mg Oral BID  . baclofen  20 mg Oral TID  . docusate sodium  100 mg Oral BID  . DULoxetine  30 mg Oral BID  . enoxaparin (LOVENOX) injection  40 mg Subcutaneous Q24H  . gabapentin  800 mg Oral QID  . lactulose  30 g Oral BID  . sodium chloride flush  3 mL Intravenous Q12H   Continuous Infusions: . piperacillin-tazobactam (ZOSYN)  IV 3.375 g (01/23/17 1334)    Assessment/Plan:  1. Clinical sepsis secondary to Pyelonephritis and abdominal pain. Unfortunately no urine culture was sent on presentation. This condition was present on admission. Urine culture Negative so far. CT scan of the abdomen consistent with pyelonephritis. Patient has a history of neurogenic bladder and a urostomy. This will increase her risk for infection. Patient with fever, tachycardia and leukocytosis on presentation. Patient had a slight temperature last night. Leukocytosis trending better. Continue Zosyn.  2. Constipation. When necessary enema, when necessary to collect suppository. Lactulose until bowel movement. X-ray shows large stool burden 3. Left arm swollen and only slight erythema. No DVT seen on ultrasound 4. Multiple sclerosis. Patient is bedbound and has chronic pain. Restarted amantadine, baclofen, gabapentin. 5. History of depression on Cymbalta 6. Constipation. Give a dose of lactulose.  Code Status:     Code Status Orders        Start     Ordered   01/20/17 0601  Full code  Continuous     01/20/17 0603    Code Status History    Date Active Date Inactive Code Status Order ID Comments User Context   01/12/2016  4:48 PM 01/15/2016  6:24 PM Full Code [id number]  Houston Siren, MD ED   08/09/2015  5:24 PM 08/12/2015  9:35 PM Full Code [id number]  Marguarite Arbour, MD Inpatient   07/19/2015  2:54 PM 07/25/2015  1:51 PM Full Code [id number]  Gladis Riffle, MD Inpatient      Disposition Plan:  Potentially can go home tomorrow  Antibiotics:  Zosyn  Time spent: 25 minutes  Alford Highland  [facility]

## 2017-01-23 NOTE — Care Management Note (Signed)
Case Management Note  Patient Details  Name: [name] MRN: [id number] Date of Birth: 1970-12-01  Subjective/Objective:  Presented to [facility] with the diagnosis of sepsis and Multiple Sclerosis. Diagnosed with MS 2005. Lives with mother. Goes to Internal Medicine in [place]. Seen by physician this month.  Prescriptions are filled per mail order. Hospital bed, wheelchair, trapeze, and lift in the home. Nursing Assistant in the home for 28 hours a week, Monday - Saturday.  Employed by [employer].   No falls. Bedbound mostly.                Action/Plan: Possible discharge 01/24/17. Will need EMS for transportation.    Expected Discharge Date:                  Expected Discharge Plan:     In-House Referral:     Discharge planning Services     Post Acute Care Choice:    Choice offered to:     DME Arranged:    DME Agency:     HH Arranged:    HH Agency:     Status of Service:     If discussed at [employer], dates discussed:    Additional Comments:  Gwenette Greet, RN 01/23/2017, 9:55 AM

## 2017-01-23 NOTE — Care Management Important Message (Signed)
Important Message  Patient Details  Name: [name] MRN: [id number] Date of Birth: [birth year]   Medicare Important Message Given:  Yes    Gwenette Greet, RN 01/23/2017, 9:42 AM

## 2017-01-23 NOTE — Progress Notes (Signed)
Pharmacy Antibiotic Note  [name] is a 46 y.o. female admitted on 01/20/2017 with sepsis. Found to have pyelonephritis and positive BCx.  Pharmacy has been consulted for zosyn dosing.  Plan: Day 4 of Abx. Will continue Zosyn 3.375 IV EI every 8 hours.   Height: 5\' 4"  (162.6 cm) Weight: 200 lb (90.7 kg) IBW/kg (Calculated) : 54.7  Temp (24hrs), Avg:99.1 F (37.3 C), Min:97.8 F (36.6 C), Max:100.7 F (38.2 C)   Recent Labs Lab 01/20/17 0335 01/20/17 0337 01/20/17 0841 01/21/17 0443 01/22/17 0750 01/23/17 0421  WBC 34.7*  --  26.7* 24.6* 12.9* 11.5*  CREATININE 0.71  --   --  0.60 0.57  --   LATICACIDVEN  --  1.2  --   --   --   --     Estimated Creatinine Clearance: 94.8 mL/min (by C-G formula based on SCr of 0.57 mg/dL).    Allergies  Allergen Reactions  . Latex Swelling and Other (See Comments)    Reaction:  Facial swelling      Thank you for allowing pharmacy to be a part of this patient's care.  Gardner Candle, PharmD, BCPS Clinical Pharmacist 01/23/2017 7:42 AM

## 2017-01-24 MED ORDER — POLYETHYLENE GLYCOL 3350 17 G PO PACK: 17.0000 g | PACK | Freq: Every day | ORAL | Status: DC

## 2017-01-24 MED ORDER — GABAPENTIN 400 MG PO CAPS: 800.0000 mg | ORAL_CAPSULE | Freq: Four times a day (QID) | ORAL | Status: DC

## 2017-01-24 MED ORDER — POLYETHYLENE GLYCOL 3350 17 G PO PACK: 17.0000 g | PACK | Freq: Every day | ORAL | 0 refills | Status: DC

## 2017-01-24 MED ORDER — AMOXICILLIN-POT CLAVULANATE 875-125 MG PO TABS: 1.0000 | ORAL_TABLET | Freq: Two times a day (BID) | ORAL | 0 refills | Status: DC

## 2017-01-24 MED ORDER — OXYCODONE HCL 5 MG PO TABS: 5.0000 mg | ORAL_TABLET | ORAL | 0 refills | Status: DC | PRN

## 2017-01-24 NOTE — Care Management (Signed)
Discharge to home today per Dr. Renae Gloss. Will arrange [facility] for transportation Ms. Clevenger in agreement with plans Gwenette Greet RN MSN CCM Care Management [phone]

## 2017-01-24 NOTE — Discharge Instructions (Signed)
Resume [facility]

## 2017-01-24 NOTE — Progress Notes (Signed)
MD order received to discharge pt home today via EMS; Care Management previously established home discharge needs for pt; verbally reviewed AVS with pt, Rxs for pt placed in discharge envelope; pt's discharge pending calling EMS for nonemergency transport, pt wants to eat lunch first

## 2017-01-24 NOTE — Progress Notes (Signed)
EMS present for non emergency transport; pt discharged via stretcher by EMS personnel to be taken home

## 2017-01-24 NOTE — Progress Notes (Signed)
Pt finished with lunch; EMS contacted via telephone call for nonemergency transport to her home

## 2017-01-24 NOTE — Discharge Summary (Signed)
[facility] -  at [facility]   PATIENT NAME: [name]    MR#:  [id number]  DATE OF BIRTH:  12/07/1970  DATE OF ADMISSION:  01/20/2017 ADMITTING PHYSICIAN: Ihor Austin, MD  DATE OF DISCHARGE: 01/24/2017  PRIMARY CARE PHYSICIAN: Blinda Leatherwood    ADMISSION DIAGNOSIS:  Sepsis, due to unspecified organism (HCC) [A41.9]  DISCHARGE DIAGNOSIS:  Active Problems:   Sepsis (HCC)   SECONDARY DIAGNOSIS:   Past Medical History:  Diagnosis Date  . Abdominal wall hernia 09/05/2014  . 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 Edema leg 09/05/2014  . 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 MS (multiple sclerosis) (HCC) 2005  . 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 Neurogenic bowel 08/12/2012  . Neuropathic pain 05/21/2015    HOSPITAL COURSE:   1. Clinical sepsis secondary to pyelonephritis. Patient also having abdominal pain near her urostomy. Unfortunately no urine culture sent on presentation. This condition was present on admission. Urine culture that I sent off was negative. CT scan of the abdomen and pelvis was consistent with pyelonephritis. The patient does have a urodynamic bladder and a urostomy which increases her risk for infection. The patient presented with fever and tachycardia and leukocytosis. Temperature curve and leukocytosis has improved. The patient was on Zosyn here in the hospital. I will switch over to Augmentin upon discharge home. 2. Constipation. I did need quite a few medications to help her bowels move. I will prescribe MiraLAX upon discharge home. X-ray of the abdomen did show a large stool burden. 3. Left arm swollen and slight erythema. Ultrasound was negative for DVT. 4. Multiple sclerosis. Patient is bedbound and has some chronic pain. On amantadine, baclofen, gabapentin. 5. Hx Depression on cymbalta 6. Constipation- miralax 7. Blood culture is a contamination  DISCHARGE CONDITIONS:    satisfactory  CONSULTS OBTAINED:  none  DRUG ALLERGIES:   Allergies  Allergen Reactions  . Latex Swelling and Other (See Comments)    Reaction:  Facial swelling     DISCHARGE MEDICATIONS:   Current Discharge Medication List    START taking these medications   Details  amoxicillin-clavulanate (AUGMENTIN) 875-125 MG tablet Take 1 tablet by mouth 2 (two) times daily. Qty: 10 tablet, Refills: 0    gabapentin (NEURONTIN) 400 MG capsule Take 2 capsules (800 mg total) by mouth 4 (four) times daily.    oxyCODONE (OXY IR/ROXICODONE) 5 MG immediate release tablet Take 1 tablet (5 mg total) by mouth every 4 (four) hours as needed for moderate pain. Qty: 10 tablet, Refills: 0    polyethylene glycol (MIRALAX / GLYCOLAX) packet Take 17 g by mouth daily. Qty: 30 each, Refills: 0      CONTINUE these medications which have NOT CHANGED   Details  acetaminophen (TYLENOL) 500 MG tablet Take 500 mg by mouth every 6 (six) hours as needed.    amantadine (SYMMETREL) 100 MG capsule Take 100 mg by mouth 2 (two) times daily. Refills: 5    baclofen (LIORESAL) 20 MG tablet Take 20 mg by mouth 3 (three) times daily.    DULoxetine (CYMBALTA) 30 MG capsule Take 30 mg by mouth 2 (two) times daily.    Vitamin D, Ergocalciferol, (DRISDOL) 50000 units CAPS capsule Take 50,000 Units by mouth every 7 (seven) days. Pt takes on Monday.      STOP taking these medications     gabapentin (NEURONTIN) 600 MG tablet          DISCHARGE INSTRUCTIONS:  Follow-up PMD one week  If you experience worsening of your admission symptoms, develop shortness of breath, life threatening emergency, suicidal or homicidal thoughts you must seek medical attention immediately by calling 911 or calling your MD immediately  if symptoms less severe.  You Must read complete instructions/literature along with all the possible adverse reactions/side effects for all the Medicines you take and that have been prescribed to  you. Take any new Medicines after you have completely understood and accept all the possible adverse reactions/side effects.   Please note  You were cared for by a hospitalist during your hospital stay. If you have any questions about your discharge medications or the care you received while you were in the hospital after you are discharged, you can call the unit and asked to speak with the hospitalist on call if the hospitalist that took care of you is not available. Once you are discharged, your primary care physician will handle any further medical issues. Please note that NO REFILLS for any discharge medications will be authorized once you are discharged, as it is imperative that you return to your primary care physician (or establish a relationship with a primary care physician if you do not have one) for your aftercare needs so that they can reassess your need for medications and monitor your lab values.    Today   CHIEF COMPLAINT:   Chief Complaint  Patient presents with  . Fever  . Weakness    HISTORY OF PRESENT ILLNESS:  [name]  is a 46 y.o. female presented with fever and weakness   VITAL SIGNS:  Blood pressure (!) 119/56, pulse 98, temperature 98.2 F (36.8 C), temperature source Oral, resp. rate 20, height 5\' 4"  (1.626 m), weight 90.7 kg (200 lb), SpO2 100 %.    PHYSICAL EXAMINATION:  GENERAL:  46 y.o.-year-old patient lying in the bed with no acute distress.  EYES: Pupils equal, round, reactive to light and accommodation. No scleral icterus. Extraocular muscles intact.  HEENT: Head atraumatic, normocephalic. Oropharynx and nasopharynx clear.  NECK:  Supple, no jugular venous distention. No thyroid enlargement, no tenderness.  LUNGS: Normal breath sounds bilaterally, no wheezing, rales,rhonchi or crepitation. No use of accessory muscles of respiration.  CARDIOVASCULAR: S1, S2 normal. No murmurs, rubs, or gallops.  ABDOMEN: Soft, non-tender, distended. Bowel  sounds present. No organomegaly or mass.  EXTREMITIES: No pedal edema, cyanosis, or clubbing.  NEUROLOGIC: Cranial nerves II through XII are intact. Bilateral lower extremity only able to wiggle toes. Sensation intact. Gait not checked.  PSYCHIATRIC: The patient is alert and oriented x 3.  SKIN: No obvious rash, lesion, or ulcer.   DATA REVIEW:   CBC  Recent Labs Lab 01/23/17 0421  WBC 11.5*  HGB 11.4*  HCT 33.5*  PLT 443*    Chemistries   Recent Labs Lab 01/21/17 0443 01/22/17 0750  NA 138 135  K 4.3 3.9  CL 109 101  CO2 23 26  GLUCOSE 103* 101*  BUN 8 7  CREATININE 0.60 0.57  CALCIUM 8.4* 8.5*  AST 42*  --   ALT 38  --   ALKPHOS 70  --   BILITOT 0.7  --     C Microbiology Results  Results for orders placed or performed during the hospital encounter of 01/20/17  Blood Culture (routine x 2)     Status: Abnormal   Collection Time: 01/20/17  3:35 AM  Result Value Ref Range Status   Specimen Description BLOOD RIGHT ARM  Final  Special Requests   Final    BOTTLES DRAWN AEROBIC AND ANAEROBIC Blood Culture adequate volume   Culture  Setup Time   Final    GRAM POSITIVE COCCI IN BOTH AEROBIC AND ANAEROBIC BOTTLES CRITICAL RESULT CALLED TO, READ BACK BY AND VERIFIED WITH: DAVID BESANTI AT 2322 01/20/17.PMH    Culture (A)  Final    STAPHYLOCOCCUS SPECIES (COAGULASE NEGATIVE) THE SIGNIFICANCE OF ISOLATING THIS ORGANISM FROM A SINGLE SET OF BLOOD CULTURES WHEN MULTIPLE SETS ARE DRAWN IS UNCERTAIN. PLEASE NOTIFY THE MICROBIOLOGY DEPARTMENT WITHIN ONE WEEK IF SPECIATION AND SENSITIVITIES ARE REQUIRED. Performed at [facility], [address], [place], [place] [address]    Report Status 01/23/2017 FINAL  Final  Blood Culture (routine x 2)     Status: None (Preliminary result)   Collection Time: 01/20/17  3:35 AM  Result Value Ref Range Status   Specimen Description BLOOD RIGHT FOREARM  Final   Special Requests   Final    BOTTLES DRAWN AEROBIC AND ANAEROBIC  Blood Culture adequate volume   Culture NO GROWTH 4 DAYS  Final   Report Status PENDING  Incomplete  Blood Culture ID Panel (Reflexed)     Status: Abnormal   Collection Time: 01/20/17  3:35 AM  Result Value Ref Range Status   Enterococcus species NOT DETECTED NOT DETECTED Final   Listeria monocytogenes NOT DETECTED NOT DETECTED Final   Staphylococcus species DETECTED (A) NOT DETECTED Final    Comment: Methicillin (oxacillin) susceptible coagulase negative staphylococcus. Possible blood culture contaminant (unless isolated from more than one blood culture draw or clinical case suggests pathogenicity). No antibiotic treatment is indicated for blood  culture contaminants. CRITICAL RESULT CALLED TO, READ BACK BY AND VERIFIED WITH: DAVID BESANTI AT 2322 01/20/17.PMH    Staphylococcus aureus NOT DETECTED NOT DETECTED Final   Methicillin resistance NOT DETECTED NOT DETECTED Final   Streptococcus species NOT DETECTED NOT DETECTED Final   Streptococcus agalactiae NOT DETECTED NOT DETECTED Final   Streptococcus pneumoniae NOT DETECTED NOT DETECTED Final   Streptococcus pyogenes NOT DETECTED NOT DETECTED Final   Acinetobacter baumannii NOT DETECTED NOT DETECTED Final   Enterobacteriaceae species NOT DETECTED NOT DETECTED Final   Enterobacter cloacae complex NOT DETECTED NOT DETECTED Final   Escherichia coli NOT DETECTED NOT DETECTED Final   Klebsiella oxytoca NOT DETECTED NOT DETECTED Final   Klebsiella pneumoniae NOT DETECTED NOT DETECTED Final   Proteus species NOT DETECTED NOT DETECTED Final   Serratia marcescens NOT DETECTED NOT DETECTED Final   Haemophilus influenzae NOT DETECTED NOT DETECTED Final   Neisseria meningitidis NOT DETECTED NOT DETECTED Final   Pseudomonas aeruginosa NOT DETECTED NOT DETECTED Final   Candida albicans NOT DETECTED NOT DETECTED Final   Candida glabrata NOT DETECTED NOT DETECTED Final   Candida krusei NOT DETECTED NOT DETECTED Final   Candida parapsilosis NOT  DETECTED NOT DETECTED Final   Candida tropicalis NOT DETECTED NOT DETECTED Final  Culture, blood (Routine X 2) w Reflex to ID Panel     Status: None (Preliminary result)   Collection Time: 01/20/17  8:47 AM  Result Value Ref Range Status   Specimen Description BLOOD LEFT ASSIST CONTROL  Final   Special Requests Blood Culture adequate volume  Final   Culture NO GROWTH 4 DAYS  Final   Report Status PENDING  Incomplete  Culture, blood (Routine X 2) w Reflex to ID Panel     Status: None (Preliminary result)   Collection Time: 01/20/17  9:51 AM  Result Value Ref Range Status   Specimen Description BLOOD LEFT WRIST  Final   Special Requests   Final    BOTTLES DRAWN AEROBIC AND ANAEROBIC Blood Culture results may not be optimal due to an inadequate volume of blood received in culture bottles   Culture NO GROWTH 4 DAYS  Final   Report Status PENDING  Incomplete  Urine culture     Status: None   Collection Time: 01/21/17  1:30 PM  Result Value Ref Range Status   Specimen Description URINE, RANDOM  Final   Special Requests NONE  Final   Culture   Final    NO GROWTH Performed at [facility], [address], [place], [place] [address]    Report Status 01/22/2017 FINAL  Final    RADIOLOGY:  Dg Abd 2 Views  Result Date: 01/23/2017 CLINICAL DATA:  Right sided abdominal pain, especially near urostomy site, and constipation x2 days. EXAM: ABDOMEN - 2 VIEW COMPARISON:  CT of the abdomen and pelvis on 01/21/2017 FINDINGS: There is gaseous dilatation of large bowel loops, increased since prior exam. Moderate stool within the descending colon go and cecum. No evidence for free intraperitoneal air. No evidence for small bowel obstruction. Status post lumbar fusion. IMPRESSION: Increased gaseous distension of large bowel loops. Moderate stool burden. Electronically Signed   By: Norva Pavlov M.D.   On: 01/23/2017 11:58     Management plans discussed with the patient, family and they are in  agreement.  CODE STATUS:     Code Status Orders        Start     Ordered   01/20/17 0601  Full code  Continuous     01/20/17 0603    Code Status History    Date Active Date Inactive Code Status Order ID Comments User Context   01/12/2016  4:48 PM 01/15/2016  6:24 PM Full Code [id number]  Houston Siren, MD ED   08/09/2015  5:24 PM 08/12/2015  9:35 PM Full Code [id number]  Marguarite Arbour, MD Inpatient   07/19/2015  2:54 PM 07/25/2015  1:51 PM Full Code [id number]  Gladis Riffle, MD Inpatient      TOTAL TIME TAKING CARE OF THIS PATIENT: 35 minutes.    Alford Highland M.D on 01/24/2017 at 3:32 PM  Between 7am to 6pm - Pager - [phone]  After 6pm go to [email or web address] - password [employer]  [facility] Office  [phone]  CC: Primary care physician; Gladis Riffle, MD

## 2017-01-25 LAB — CULTURE, BLOOD (ROUTINE X 2)
CULTURE: NO GROWTH
Culture: NO GROWTH
Culture: NO GROWTH
SPECIAL REQUESTS: ADEQUATE
Special Requests: ADEQUATE

## 2017-04-10 ENCOUNTER — Emergency Department: Payer: Medicare Other

## 2017-04-10 ENCOUNTER — Inpatient Hospital Stay
Admission: EM | Admit: 2017-04-10 | Discharge: 2017-04-14 | DRG: 871 | Disposition: A | Discharge status: Home / Self Care | Payer: Medicare Other | Attending: Internal Medicine | Admitting: Internal Medicine

## 2017-04-10 DIAGNOSIS — Z8349 Family history of other endocrine, nutritional and metabolic diseases: Secondary | ICD-10-CM

## 2017-04-10 DIAGNOSIS — I248 Other forms of acute ischemic heart disease: Secondary | ICD-10-CM | POA: Diagnosis present

## 2017-04-10 DIAGNOSIS — Z936 Other artificial openings of urinary tract status: Secondary | ICD-10-CM | POA: Diagnosis not present

## 2017-04-10 DIAGNOSIS — Z7401 Bed confinement status: Secondary | ICD-10-CM

## 2017-04-10 DIAGNOSIS — A419 Sepsis, unspecified organism: Secondary | ICD-10-CM | POA: Diagnosis present

## 2017-04-10 DIAGNOSIS — Z87440 Personal history of urinary (tract) infections: Secondary | ICD-10-CM

## 2017-04-10 DIAGNOSIS — Z8619 Personal history of other infectious and parasitic diseases: Secondary | ICD-10-CM | POA: Diagnosis not present

## 2017-04-10 DIAGNOSIS — Z8249 Family history of ischemic heart disease and other diseases of the circulatory system: Secondary | ICD-10-CM

## 2017-04-10 DIAGNOSIS — G839 Paralytic syndrome, unspecified: Secondary | ICD-10-CM | POA: Diagnosis present

## 2017-04-10 DIAGNOSIS — G9341 Metabolic encephalopathy: Secondary | ICD-10-CM | POA: Diagnosis present

## 2017-04-10 DIAGNOSIS — K592 Neurogenic bowel, not elsewhere classified: Secondary | ICD-10-CM | POA: Diagnosis present

## 2017-04-10 DIAGNOSIS — Z90710 Acquired absence of both cervix and uterus: Secondary | ICD-10-CM

## 2017-04-10 DIAGNOSIS — E876 Hypokalemia: Secondary | ICD-10-CM | POA: Diagnosis present

## 2017-04-10 DIAGNOSIS — N319 Neuromuscular dysfunction of bladder, unspecified: Secondary | ICD-10-CM | POA: Diagnosis present

## 2017-04-10 DIAGNOSIS — B961 Klebsiella pneumoniae [K. pneumoniae] as the cause of diseases classified elsewhere: Secondary | ICD-10-CM | POA: Diagnosis present

## 2017-04-10 DIAGNOSIS — A4159 Other Gram-negative sepsis: Secondary | ICD-10-CM | POA: Diagnosis present

## 2017-04-10 DIAGNOSIS — G35 Multiple sclerosis: Secondary | ICD-10-CM | POA: Diagnosis present

## 2017-04-10 DIAGNOSIS — L89890 Pressure ulcer of other site, unstageable: Secondary | ICD-10-CM | POA: Diagnosis present

## 2017-04-10 DIAGNOSIS — G9340 Encephalopathy, unspecified: Secondary | ICD-10-CM

## 2017-04-10 DIAGNOSIS — R4182 Altered mental status, unspecified: Secondary | ICD-10-CM | POA: Diagnosis present

## 2017-04-10 DIAGNOSIS — N390 Urinary tract infection, site not specified: Secondary | ICD-10-CM | POA: Diagnosis present

## 2017-04-10 DIAGNOSIS — Z79899 Other long term (current) drug therapy: Secondary | ICD-10-CM | POA: Diagnosis not present

## 2017-04-10 DIAGNOSIS — R079 Chest pain, unspecified: Secondary | ICD-10-CM

## 2017-04-10 DIAGNOSIS — R0602 Shortness of breath: Secondary | ICD-10-CM

## 2017-04-10 DIAGNOSIS — Z91040 Latex allergy status: Secondary | ICD-10-CM

## 2017-04-10 LAB — CBC WITH DIFFERENTIAL/PLATELET
BASOS PCT: 0 %
Basophils Absolute: 0 10*3/uL (ref 0–0.1)
EOS ABS: 0 10*3/uL (ref 0–0.7)
Eosinophils Relative: 0 %
HEMATOCRIT: 40.4 % (ref 35.0–47.0)
HEMOGLOBIN: 13.4 g/dL (ref 12.0–16.0)
LYMPHS PCT: 4 %
Lymphs Abs: 1.2 10*3/uL (ref 1.0–3.6)
MCH: 28.6 pg (ref 26.0–34.0)
MCHC: 33.3 g/dL (ref 32.0–36.0)
MCV: 86 fL (ref 80.0–100.0)
MONO ABS: 3.4 10*3/uL — AB (ref 0.2–0.9)
Monocytes Relative: 11 %
NEUTROS PCT: 85 %
Neutro Abs: 25.9 10*3/uL — ABNORMAL HIGH (ref 1.4–6.5)
Platelets: 316 10*3/uL (ref 150–440)
RBC: 4.7 MIL/uL (ref 3.80–5.20)
RDW: 15.4 % — ABNORMAL HIGH (ref 11.5–14.5)
WBC: 30.5 10*3/uL — AB (ref 3.6–11.0)

## 2017-04-10 LAB — CSF CELL COUNT WITH DIFFERENTIAL
Eosinophils, CSF: 0 %
Eosinophils, CSF: 0 %
LYMPHS CSF: 90 %
Lymphs, CSF: 0 %
MONOCYTE-MACROPHAGE-SPINAL FLUID: 0 %
Monocyte-Macrophage-Spinal Fluid: 10 %
OTHER CELLS CSF: 0
Other Cells, CSF: 0
RBC COUNT CSF: 19 /mm3 — AB (ref 0–3)
RBC COUNT CSF: 23 /mm3 — AB (ref 0–3)
SEGMENTED NEUTROPHILS-CSF: 0 %
Segmented Neutrophils-CSF: 0 %
TUBE #: 1
Tube #: 3
WBC CSF: 0 /mm3 (ref 0–5)
WBC CSF: 2 /mm3 (ref 0–5)

## 2017-04-10 LAB — URINALYSIS, ROUTINE W REFLEX MICROSCOPIC
BILIRUBIN URINE: NEGATIVE
GLUCOSE, UA: NEGATIVE mg/dL
KETONES UR: 5 mg/dL — AB
NITRITE: NEGATIVE
PH: 7 (ref 5.0–8.0)
Protein, ur: 30 mg/dL — AB
SPECIFIC GRAVITY, URINE: 1.009 (ref 1.005–1.030)
Squamous Epithelial / LPF: NONE SEEN

## 2017-04-10 LAB — PROTEIN AND GLUCOSE, CSF
Glucose, CSF: 71 mg/dL — ABNORMAL HIGH (ref 40–70)
TOTAL PROTEIN, CSF: 21 mg/dL (ref 15–45)

## 2017-04-10 LAB — COMPREHENSIVE METABOLIC PANEL
ALBUMIN: 3.6 g/dL (ref 3.5–5.0)
ALT: 16 U/L (ref 14–54)
ANION GAP: 12 (ref 5–15)
AST: 23 U/L (ref 15–41)
Alkaline Phosphatase: 67 U/L (ref 38–126)
BILIRUBIN TOTAL: 1.7 mg/dL — AB (ref 0.3–1.2)
BUN: 18 mg/dL (ref 6–20)
CHLORIDE: 99 mmol/L — AB (ref 101–111)
CO2: 22 mmol/L (ref 22–32)
Calcium: 9.4 mg/dL (ref 8.9–10.3)
Creatinine, Ser: 0.9 mg/dL (ref 0.44–1.00)
GFR calc Af Amer: >60 mL/min (ref >60)
Glucose, Bld: 117 mg/dL — ABNORMAL HIGH (ref 65–99)
POTASSIUM: 3.9 mmol/L (ref 3.5–5.1)
Sodium: 133 mmol/L — ABNORMAL LOW (ref 135–145)
TOTAL PROTEIN: 9.3 g/dL — AB (ref 6.5–8.1)

## 2017-04-10 LAB — HCG, QUANTITATIVE, PREGNANCY: hCG, Beta Chain, Quant, S: 2 m[IU]/mL (ref <5)

## 2017-04-10 LAB — GRAM STAIN

## 2017-04-10 LAB — LIPASE, BLOOD: Lipase: 19 U/L (ref 11–51)

## 2017-04-10 LAB — TROPONIN I: Troponin I: 0.09 ng/mL (ref <0.03)

## 2017-04-10 LAB — PROCALCITONIN: Procalcitonin: 8.53 ng/mL

## 2017-04-10 LAB — LACTIC ACID, PLASMA: LACTIC ACID, VENOUS: 1.3 mmol/L (ref 0.5–1.9)

## 2017-04-10 MED ORDER — SODIUM CHLORIDE 0.9 % IV SOLN
INTRAVENOUS | Status: AC
  Administered 2017-04-10 – 2017-04-11 (×2): via INTRAVENOUS

## 2017-04-10 MED ORDER — VANCOMYCIN HCL IN DEXTROSE 1-5 GM/200ML-% IV SOLN
1000.0000 mg | Freq: Once | INTRAVENOUS | Status: AC
  Administered 2017-04-10: 1000 mg via INTRAVENOUS
  Filled 2017-04-10: qty 200

## 2017-04-10 MED ORDER — SODIUM CHLORIDE 0.9 % IV BOLUS (SEPSIS)
2000.0000 mL | Freq: Once | INTRAVENOUS | Status: AC
  Administered 2017-04-10: 2000 mL via INTRAVENOUS

## 2017-04-10 MED ORDER — LIDOCAINE HCL (PF) 1 % IJ SOLN
INTRAMUSCULAR | Status: AC
  Administered 2017-04-10: 10 mL
  Filled 2017-04-10: qty 10

## 2017-04-10 MED ORDER — DEXTROSE 5 % IV SOLN
550.0000 mg | Freq: Once | INTRAVENOUS | Status: AC
  Administered 2017-04-10: 550 mg via INTRAVENOUS
  Filled 2017-04-10: qty 11

## 2017-04-10 MED ORDER — DEXTROSE 5 % IV SOLN
2.0000 g | Freq: Once | INTRAVENOUS | Status: AC
  Administered 2017-04-10: 2 g via INTRAVENOUS
  Filled 2017-04-10: qty 2

## 2017-04-10 MED ORDER — SODIUM CHLORIDE 0.9 % IV BOLUS (SEPSIS)
3000.0000 mL | Freq: Once | INTRAVENOUS | Status: AC
  Administered 2017-04-10 (×2): 1000 mL via INTRAVENOUS

## 2017-04-10 MED ORDER — ACETAMINOPHEN 10 MG/ML IV SOLN
1000.0000 mg | Freq: Once | INTRAVENOUS | Status: AC
  Administered 2017-04-10: 1000 mg via INTRAVENOUS
  Filled 2017-04-10: qty 100

## 2017-04-10 NOTE — ED Notes (Signed)
CODE  SEPSIS  CALLED  TO  THOMAS  AT  [facility] 

## 2017-04-10 NOTE — ED Notes (Signed)
Vancomycin given at 2057. Came down from pharmacy after two calls.

## 2017-04-10 NOTE — ED Triage Notes (Signed)
Per EMS pt family called out due to pt having altered mental status and having a fever.  Upon arrival pt has a temp of 103.1.  Pt is NOT oriented to place, person, situation, time.  Code Sepsis called.

## 2017-04-10 NOTE — ED Notes (Signed)
Date and time results received: 04/10/17 2055 (use smartphrase ".now" to insert current time)  Test: troponin Critical Value: 0.09  Name of Provider Notified: Dr. Lamont Snowball  Orders Received? Or Actions Taken?:

## 2017-04-10 NOTE — H&P (Signed)
[facility] - [place] at [facility]   PATIENT NAME: [name]    MR#:  [id number]  DATE OF BIRTH:  17-Dec-1970  DATE OF ADMISSION:  04/10/2017  PRIMARY CARE PHYSICIAN: Gladis Riffle, MD   REQUESTING/REFERRING PHYSICIAN: Rifenbark  CHIEF COMPLAINT:   Chief Complaint  Patient presents with  . Altered Mental Status  . Fever    HISTORY OF PRESENT ILLNESS: [name]  is a 46 y.o. female with a known history of MS, neurogenic bladder, bed bound- urostomy tubes, recurrent UTI. For last 2 days have some confusion, and had low grade fever today- so family called EMS. In ER- CT head, Xray chest not very clar for source of infection. UA is still not sent. LP is done and under process. Fever and High WBCs count- so given for admission.  PAST MEDICAL HISTORY:   Past Medical History:  Diagnosis Date  . Abdominal wall hernia 09/05/2014  . 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 Edema leg 09/05/2014  . 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 MS (multiple sclerosis) (HCC) 2005  . 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 Neurogenic bowel 08/12/2012  . Neuropathic pain 05/21/2015    PAST SURGICAL HISTORY: Past Surgical History:  Procedure Laterality Date  . ABDOMINAL HYSTERECTOMY  2001  . BACK SURGERY  2004  . REVISION UROSTOMY CUTANEOUS  06/2012    SOCIAL HISTORY:  Social History  Substance Use Topics  . Smoking status: Never Smoker  . Smokeless tobacco: Never Used  . Alcohol use 0.0 oz/week    FAMILY HISTORY:  Family History  Problem Relation Age of Onset  . Osteoarthritis Mother   . Hypertension Mother   . Hyperlipidemia Mother   . Thyroid disease Mother   . Cancer Father   . Asthma Brother     DRUG ALLERGIES:  Allergies  Allergen Reactions  . Latex Swelling and Other (See Comments)    Reaction:  Facial swelling     REVIEW OF SYSTEMS:   Confusion, not able to give ROs.  MEDICATIONS AT HOME:   Prior to Admission medications   Medication Sig Start Date End Date Taking? Authorizing Provider  acetaminophen (TYLENOL) 500 MG tablet Take 500 mg by mouth every 6 (six) hours as needed.   Yes [provider]  amantadine (SYMMETREL) 100 MG capsule Take 100 mg by mouth 2 (two) times daily.   Yes [provider]  baclofen (LIORESAL) 20 MG tablet Take 20 mg by mouth 3 (three) times daily.   Yes [provider]  DULoxetine (CYMBALTA) 30 MG capsule Take 30 mg by mouth 2 (two) times daily.   Yes [provider]  gabapentin (NEURONTIN) 400 MG capsule Take 2 capsules (800 mg total) by mouth 4 (four) times daily. 01/24/17  Yes Wieting, Richard, MD  polyethylene glycol (MIRALAX / GLYCOLAX) packet Take 17 g by mouth daily. 01/24/17  Yes Wieting, Richard, MD  Vitamin D, Ergocalciferol, (DRISDOL) 50000 units CAPS capsule Take 50,000 Units by mouth every 7 (seven) days. Pt takes on Monday.   Yes [provider]      PHYSICAL EXAMINATION:   VITAL SIGNS: Blood pressure 120/66, pulse (!) 117, temperature (!) 100.9 F (38.3 C), resp. rate 19, SpO2 96 %.  GENERAL:  46 y.o.-year-old patient lying in the bed with no acute distress.  EYES: Pupils equal, round, reactive to light and accommodation. No scleral icterus. Extraocular muscles intact.  HEENT: Head atraumatic, normocephalic. Oropharynx and nasopharynx  clear.  NECK:  Supple, no jugular venous distention. No thyroid enlargement, no tenderness.  LUNGS: Normal breath sounds bilaterally, no wheezing, rales,rhonchi or crepitation. No use of accessory muscles of respiration.  CARDIOVASCULAR: S1, S2 normal. No murmurs, rubs, or gallops.  ABDOMEN: Soft, nontender, nondistended. Bowel sounds present. No organomegaly or mass. Urostomy bag in place on abdominal wall. EXTREMITIES: No pedal edema, cyanosis, or clubbing.  NEUROLOGIC: Cranial nerves II through XII are intact. Muscle strength 0/5 in all extremities. Sensation  intact. Gait not checked.  PSYCHIATRIC: The patient is alert and oriented x 1.  SKIN: No obvious rash, lesion, or ulcer.   LABORATORY PANEL:   CBC  Recent Labs Lab 04/10/17 1857  WBC 30.5*  HGB 13.4  HCT 40.4  PLT 316  MCV 86.0  MCH 28.6  MCHC 33.3  RDW 15.4*  LYMPHSABS 1.2  MONOABS 3.4*  EOSABS 0.0  BASOSABS 0.0   ------------------------------------------------------------------------------------------------------------------  Chemistries   Recent Labs Lab 04/10/17 1857  NA 133*  K 3.9  CL 99*  CO2 22  GLUCOSE 117*  BUN 18  CREATININE 0.90  CALCIUM 9.4  AST 23  ALT 16  ALKPHOS 67  BILITOT 1.7*   ------------------------------------------------------------------------------------------------------------------ CrCl cannot be calculated (Unknown ideal weight.). ------------------------------------------------------------------------------------------------------------------ No results for input(s): TSH, T4TOTAL, T3FREE, THYROIDAB in the last 72 hours.  Invalid input(s): FREET3   Coagulation profile No results for input(s): INR, PROTIME in the last 168 hours. ------------------------------------------------------------------------------------------------------------------- No results for input(s): DDIMER in the last 72 hours. -------------------------------------------------------------------------------------------------------------------  Cardiac Enzymes  Recent Labs Lab 04/10/17 1857  TROPONINI 0.09*   ------------------------------------------------------------------------------------------------------------------ Invalid input(s): POCBNP  ---------------------------------------------------------------------------------------------------------------  Urinalysis    Component Value Date/Time   COLORURINE YELLOW (A) 01/21/2017 1330   APPEARANCEUR HAZY (A) 01/21/2017 1330   APPEARANCEUR Cloudy 10/05/2014 2100   LABSPEC 1.008 01/21/2017 1330    LABSPEC 1.009 10/05/2014 2100   PHURINE 6.0 01/21/2017 1330   GLUCOSEU NEGATIVE 01/21/2017 1330   GLUCOSEU Negative 10/05/2014 2100   HGBUR MODERATE (A) 01/21/2017 1330   BILIRUBINUR NEGATIVE 01/21/2017 1330   BILIRUBINUR Negative 10/05/2014 2100   KETONESUR NEGATIVE 01/21/2017 1330   PROTEINUR NEGATIVE 01/21/2017 1330   NITRITE NEGATIVE 01/21/2017 1330   LEUKOCYTESUR LARGE (A) 01/21/2017 1330   LEUKOCYTESUR 3+ 10/05/2014 2100     RADIOLOGY: Ct Head Wo Contrast  Result Date: 04/10/2017 CLINICAL DATA:  Altered mentation and fever. EXAM: CT HEAD WITHOUT CONTRAST TECHNIQUE: Contiguous axial images were obtained from the base of the skull through the vertex without intravenous contrast. COMPARISON:  MRI 04/22/2010 FINDINGS: Brain: No evidence of acute infarction, hemorrhage, hydrocephalus, extra-axial collection or mass lesion/mass effect. Vascular: No hyperdense vessel or unexpected calcification. Skull: Normal. Negative for fracture or focal lesion. Sinuses/Orbits: No acute finding. Other: None. IMPRESSION: No acute intracranial abnormality. Electronically Signed   By: Tollie Eth M.D.   On: 04/10/2017 19:49   Dg Chest Port 1 View  Result Date: 04/10/2017 CLINICAL DATA:  Altered mental status and fever. EXAM: PORTABLE CHEST 1 VIEW COMPARISON:  01/20/2017 FINDINGS: 1940 hours. Limited study due to low volumes and kyphotic positioning. Within this limitation, the cardiopericardial silhouette appears enlarged. There is pulmonary vascular congestion. Right parahilar opacity has relatively well-defined margins. Probable basilar atelectasis bilaterally. The visualized bony structures of the thorax are intact. Telemetry leads overlie the chest. IMPRESSION: Right parahilar atelectasis or pneumonia. Given the low volume film, mass lesion cannot be excluded. Recommend dedicated PA and lateral views of the chest when the patient is able to cooperate with  upright positioning. Electronically Signed   By: Kennith Center M.D.   On: 04/10/2017 19:57    EKG: Orders placed or performed during the hospital encounter of 04/10/17  . ED EKG 12-Lead  . ED EKG 12-Lead    IMPRESSION AND PLAN:  * Sepsis   Altered mental status   Likely UTI    UA is still needed to be sent.   Spoke to nurse to send from urostomy bag.   LP is done, pending results.   One dose of broad spectrum Abx given.   As per nurse, she had one formed BM in ER.    If any concern about diarrhea, may need to check C diff.  She also had MSSA bacteremia in recent past.  * MS and paralysis   Bed bound.   At baseline    Cont home meds.   All the records are reviewed and case discussed with ED provider. Management plans discussed with the patient, family and they are in agreement.  CODE STATUS: Full. Code Status History    Date Active Date Inactive Code Status Order ID Comments User Context   01/20/2017  6:03 AM 01/24/2017  6:43 PM Full Code [id number]  Ihor Austin, MD ED   01/12/2016  4:48 PM 01/15/2016  6:24 PM Full Code [id number]  Houston Siren, MD ED   08/09/2015  5:24 PM 08/12/2015  9:35 PM Full Code [id number]  Marguarite Arbour, MD Inpatient   07/19/2015  2:54 PM 07/25/2015  1:51 PM Full Code [id number]  Gladis Riffle, MD Inpatient     Discussed with her sister in room.  TOTAL TIME TAKING CARE OF THIS PATIENT: 45 minutes.    Altamese Dilling M.D on 04/10/2017   Between 7am to 6pm - Pager - [phone]  After 6pm go to [email or web address] - Social research officer, government  [facility]  Office  [phone]  CC: Primary care physician; Gladis Riffle, MD   Note: This dictation was prepared with Dragon dictation along with smaller phrase technology. Any transcriptional errors that result from this process are unintentional.

## 2017-04-10 NOTE — ED Notes (Signed)
Date and time results received: 04/10/17 2055 (use smartphrase ".now" to insert current time)  Test: Troponin Critical Value: 0.09  Name of Provider Notified: Dr. Lamont Snowball  Orders Received? Or Actions Taken?:

## 2017-04-10 NOTE — ED Notes (Signed)
Error in charting, MD ordered 3,000 ml of normal saline. Initially charted 2 L normal saline, then added a 3rd liter of normal saline. 3 L normal saline hanging currently. Pt to receive at this time 3,000 ml of normal saline total.

## 2017-04-10 NOTE — ED Provider Notes (Signed)
[facility] Emergency Department Provider Note  ____________________________________________   First MD Initiated Contact with Patient 04/10/17 1842     (approximate)  I have reviewed the triage vital signs and the nursing notes.   HISTORY  Chief Complaint Altered Mental Status and Fever  Level V exemption history is limited by the patient's clinical condition   HPI [name] is a 46 y.o. female who comes to the emergency department via EMS with altered mental status and fever for roughly 24 hours. History is challenging to obtain as the patient arrives profoundly altered. According to EMS the patient has a past medical history of multiple sclerosis and has not been behaving normally for the last day or so. He was concerned about her behavior so they called 911.   Past Medical History:  Diagnosis Date  . Abdominal wall hernia 09/05/2014  . 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 Edema leg 09/05/2014  . 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 MS (multiple sclerosis) (HCC) 2005  . 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 Neurogenic bowel 08/12/2012  . Neuropathic pain 05/21/2015    Patient Active Problem List   Diagnosis Date Noted  . Sepsis (HCC) 01/12/2016  . Complicated UTI (urinary tract infection) 08/09/2015  . Exacerbation of multiple sclerosis (HCC) 08/09/2015  . 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 Multiple sclerosis (HCC)   . Neuropathic pain 05/21/2015  . 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 History of spinal surgery 04/29/2015  . Edema leg 09/05/2014  . Adiposity 09/05/2014  . Abdominal wall hernia 09/05/2014  . 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 Neurogenic bowel 08/12/2012  . Infection of urinary tract 04/07/2006  . 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 ABDOMINAL HYSTERECTOMY  2001  . BACK SURGERY  2004  . REVISION UROSTOMY CUTANEOUS  06/2012    Prior to Admission medications   Medication Sig Start Date End Date Taking? Authorizing Provider  acetaminophen (TYLENOL) 500 MG tablet Take 500 mg by mouth every 6 (six) hours as needed.    [provider]  amantadine (SYMMETREL) 100 MG capsule Take 100 mg by mouth 2 (two) times daily.    [provider]  amoxicillin-clavulanate (AUGMENTIN) 875-125 MG tablet Take 1 tablet by mouth 2 (two) times daily. 01/24/17   Alford Highland, MD  baclofen (LIORESAL) 20 MG tablet Take 20 mg by mouth 3 (three) times daily.    [provider]  DULoxetine (CYMBALTA) 30 MG capsule Take 30 mg by mouth 2 (two) times daily.    [provider]  gabapentin (NEURONTIN) 400 MG capsule Take 2 capsules (800 mg total) by mouth 4 (four) times daily. 01/24/17   Alford Highland, MD  oxyCODONE (OXY IR/ROXICODONE) 5 MG immediate release tablet Take 1 tablet (5 mg total) by mouth every 4 (four) hours as needed for moderate pain. 01/24/17   Alford Highland, MD  polyethylene glycol (MIRALAX / GLYCOLAX) packet Take 17 g by mouth daily. 01/24/17   Alford Highland, MD  Vitamin D, Ergocalciferol, (DRISDOL) 50000 units CAPS capsule Take 50,000 Units by mouth every 7 (seven) days. Pt takes on Monday.    [provider]    Allergies Latex  Family History  Problem Relation Age of Onset  . Osteoarthritis Mother   . Hypertension Mother   . Hyperlipidemia Mother   . Thyroid  disease Mother   . Cancer Father   . Asthma Brother     Social History Social History  Substance Use Topics  . Smoking status: Never Smoker  . Smokeless tobacco: Never Used  . Alcohol use 0.0 oz/week    Review of Systems Level V exemption history Limited by the patient's clinical condition  ____________________________________________   PHYSICAL EXAM:  VITAL SIGNS: ED Triage Vitals  Enc Vitals Group      BP      Pulse      Resp      Temp      Temp src      SpO2      Weight      Height      Head Circumference      Peak Flow      Pain Score      Pain Loc      Pain Edu?      Excl. in GC?     Constitutional:Appears critically ill diaphoretic and confused. Alert and oriented to her name only she thinks it is 2006 Eyes: PERRL EOMI. Head: Atraumatic. Nose: No congestion/rhinnorhea. Mouth/Throat: No trismus Neck: No stridor.   Cardiovascular: Tachycardic rate, regular rhythm. Grossly normal heart sounds.  Good peripheral circulation. Respiratory: Normal respiratory effort.  No retractions. Lungs CTAB and moving good air Gastrointestinal: Obese urostomy in place soft abdomen and no frank peritonitis Musculoskeletal: No lower extremity edema   Neurologic: No gross focal neurologic deficits are appreciated. Skin:  Copious diaphoresis Psychiatric: Profoundly altered    ____________________________________________   DIFFERENTIAL includes but not limited to  Sepsis, septic shock, meningitis, encephalitis, pneumonia, urinary tract infection, bacteremia ____________________________________________   LABS (all labs ordered are listed, but only abnormal results are displayed)  Labs Reviewed  CBC WITH DIFFERENTIAL/PLATELET - Abnormal; Notable for the following:       Result Value   WBC 30.5 (*)    RDW 15.4 (*)    All other components within normal limits  CULTURE, BLOOD (ROUTINE X 2)  CULTURE, BLOOD (ROUTINE X 2)  URINE CULTURE  CSF CULTURE  GRAM STAIN  LACTIC ACID, PLASMA  HCG, QUANTITATIVE, PREGNANCY  LACTIC ACID, PLASMA  COMPREHENSIVE METABOLIC PANEL  LIPASE, BLOOD  TROPONIN I  PROCALCITONIN  URINALYSIS, ROUTINE W REFLEX MICROSCOPIC  CSF CELL COUNT WITH DIFFERENTIAL  CSF CELL COUNT WITH DIFFERENTIAL  PROTEIN AND GLUCOSE, CSF  HERPES SIMPLEX VIRUS(HSV) DNA BY PCR  PROTIME-INR    Extremely high white count concerning for bacterial  infection __________________________________________  EKG  ED ECG REPORT I, Merrily Brittle, the attending physician, personally viewed and interpreted this ECG.  Date: 04/10/2017 Rate: 136 Rhythm: normal sinus rhythm QRS Axis: normal Intervals: normal ST/T Wave abnormalities: normal Narrative Interpretation: Abnormal  ____________________________________________  RADIOLOGY  Head CT with no acute disease chest x-ray concerning for possible pneumonia ____________________________________________   PROCEDURES  Procedure(s) performed: yes  LUMBAR PUNCTURE  Date/Time: 04/10/2017 at 8:23 PM Performed by: Merrily Brittle  Consent: Verbal consent obtainedFrom family and bedside  Risks and benefits: risks, benefits and alternatives were discussed Consent given by: Sr. at bedside Patient understanding: patient states understanding of the procedure being performed  Patient consent: the patient's understanding of the procedure matches consent given  Procedure consent: procedure consent matches procedure scheduled  Relevant documents: relevant documents present and verified  Test results: test results available and properly labeled Site marked: the operative site was marked Imaging studies: imaging studies available  Required items: required blood products, implants, devices,  and special equipment available  Patient identity confirmed: verbally with patient and arm band  Time out: Immediately prior to procedure a "time out" was called to verify the correct patient, procedure, equipment, support staff and site/side marked as required.  Indications: Possible meningitis possible encephalitis  Anesthesia: local infiltration Local anesthetic: lidocaine 1% without epinephrine Anesthetic total: 3 ml Patient sedated: No  Analgesia: Local  Preparation: Patient was prepped and draped in the usual sterile fashion. Lumbar space: L3-L4 interspace Patient's position: left lateral  decubitus Needle gauge: 18 Needle length: 3.5 in Number of attempts: 2 Opening pressure: 12 cm H2O Fluid appearance: Clear Tubes of fluid: 4 Total volume: 8 ml Post-procedure: site cleaned and adhesive bandage applied Patient tolerance: Patient tolerated the procedure well with no immediate complications   Procedures  Critical Care performed: no  Observation: no ____________________________________________   INITIAL IMPRESSION / ASSESSMENT AND PLAN / ED COURSE  Pertinent labs & imaging results that were available during my care of the patient were reviewed by me and considered in my medical decision making (see chart for details).  The patient arrives encephalopathic febrile to 103.1 and tachycardic to 140 bpm. She is clearly septic however I'm also concerned that she may be encephalopathic as well. Broad labs are pending as well as 2 g of ceftriaxone, acyclovir, and vancomycin for possible encephalitis. She will require a lumbar puncture as well.  ----------------------------------------- 8:25 PM on 04/10/2017 -----------------------------------------  Perform the lumbar puncture without complication on 2 sticks. Opening pressure was 12 and the fluid was clear. She still may have viral encephalitis spell continue the antibiotics but at this point we'll admit her to the hospital. Her heart rate is come down with Tylenol and fluids.      ____________________________________________   FINAL CLINICAL IMPRESSION(S) / ED DIAGNOSES  Final diagnoses:  Sepsis, due to unspecified organism (HCC)  Encephalopathy      NEW MEDICATIONS STARTED DURING THIS VISIT:  New Prescriptions   No medications on file     Note:  This document was prepared using Dragon voice recognition software and may include unintentional dictation errors.     Merrily Brittle, MD 04/10/17 2029

## 2017-04-11 ENCOUNTER — Inpatient Hospital Stay: Payer: Medicare Other

## 2017-04-11 ENCOUNTER — Inpatient Hospital Stay
Admit: 2017-04-11 | Discharge: 2017-04-11 | Disposition: A | Discharge status: Home / Self Care | Payer: Medicare Other | Attending: Internal Medicine | Admitting: Internal Medicine

## 2017-04-11 LAB — BLOOD CULTURE ID PANEL (REFLEXED)
ACINETOBACTER BAUMANNII: NOT DETECTED
Acinetobacter baumannii: NOT DETECTED
CANDIDA ALBICANS: NOT DETECTED
CANDIDA GLABRATA: NOT DETECTED
CANDIDA KRUSEI: NOT DETECTED
CANDIDA PARAPSILOSIS: NOT DETECTED
CANDIDA TROPICALIS: NOT DETECTED
CANDIDA TROPICALIS: NOT DETECTED
Candida albicans: NOT DETECTED
Candida glabrata: NOT DETECTED
Candida krusei: NOT DETECTED
Candida parapsilosis: NOT DETECTED
Carbapenem resistance: NOT DETECTED
ENTEROBACTER CLOACAE COMPLEX: NOT DETECTED
ENTEROBACTER CLOACAE COMPLEX: NOT DETECTED
ENTEROCOCCUS SPECIES: NOT DETECTED
ESCHERICHIA COLI: NOT DETECTED
Enterobacteriaceae species: DETECTED — AB
Enterobacteriaceae species: NOT DETECTED
Enterococcus species: NOT DETECTED
Escherichia coli: NOT DETECTED
HAEMOPHILUS INFLUENZAE: NOT DETECTED
Haemophilus influenzae: NOT DETECTED
KLEBSIELLA PNEUMONIAE: NOT DETECTED
Klebsiella oxytoca: NOT DETECTED
Klebsiella oxytoca: NOT DETECTED
Klebsiella pneumoniae: DETECTED — AB
LISTERIA MONOCYTOGENES: NOT DETECTED
Listeria monocytogenes: NOT DETECTED
Methicillin resistance: DETECTED — AB
Neisseria meningitidis: NOT DETECTED
Neisseria meningitidis: NOT DETECTED
PROTEUS SPECIES: NOT DETECTED
PROTEUS SPECIES: NOT DETECTED
Pseudomonas aeruginosa: NOT DETECTED
Pseudomonas aeruginosa: NOT DETECTED
STAPHYLOCOCCUS SPECIES: DETECTED — AB
STAPHYLOCOCCUS SPECIES: NOT DETECTED
STREPTOCOCCUS AGALACTIAE: NOT DETECTED
STREPTOCOCCUS SPECIES: NOT DETECTED
Serratia marcescens: NOT DETECTED
Serratia marcescens: NOT DETECTED
Staphylococcus aureus (BCID): NOT DETECTED
Staphylococcus aureus (BCID): NOT DETECTED
Streptococcus agalactiae: NOT DETECTED
Streptococcus pneumoniae: NOT DETECTED
Streptococcus pneumoniae: NOT DETECTED
Streptococcus pyogenes: NOT DETECTED
Streptococcus pyogenes: NOT DETECTED
Streptococcus species: NOT DETECTED

## 2017-04-11 LAB — BASIC METABOLIC PANEL
Anion gap: 11 (ref 5–15)
BUN: 14 mg/dL (ref 6–20)
CHLORIDE: 106 mmol/L (ref 101–111)
CO2: 22 mmol/L (ref 22–32)
CREATININE: 0.84 mg/dL (ref 0.44–1.00)
Calcium: 8.7 mg/dL — ABNORMAL LOW (ref 8.9–10.3)
GFR calc non Af Amer: >60 mL/min (ref >60)
Glucose, Bld: 117 mg/dL — ABNORMAL HIGH (ref 65–99)
Potassium: 3.8 mmol/L (ref 3.5–5.1)
Sodium: 139 mmol/L (ref 135–145)

## 2017-04-11 LAB — LACTIC ACID, PLASMA: Lactic Acid, Venous: 3.3 mmol/L (ref 0.5–1.9)

## 2017-04-11 LAB — CBC
HCT: 37.1 % (ref 35.0–47.0)
HEMOGLOBIN: 12.2 g/dL (ref 12.0–16.0)
MCH: 28.5 pg (ref 26.0–34.0)
MCHC: 32.9 g/dL (ref 32.0–36.0)
MCV: 86.6 fL (ref 80.0–100.0)
PLATELETS: 293 10*3/uL (ref 150–440)
RBC: 4.29 MIL/uL (ref 3.80–5.20)
RDW: 15.7 % — ABNORMAL HIGH (ref 11.5–14.5)
WBC: 28.8 10*3/uL — ABNORMAL HIGH (ref 3.6–11.0)

## 2017-04-11 LAB — TROPONIN I
Troponin I: 0.04 ng/mL (ref <0.03)
Troponin I: 0.11 ng/mL (ref <0.03)
Troponin I: <0.03 ng/mL (ref <0.03)

## 2017-04-11 LAB — PROTIME-INR
INR: 1.36
PROTHROMBIN TIME: 16.9 s — AB (ref 11.4–15.2)

## 2017-04-11 LAB — FIBRIN DERIVATIVES D-DIMER (ARMC ONLY): FIBRIN DERIVATIVES D-DIMER (ARMC): 2790.24 — AB (ref 0.00–499.00)

## 2017-04-11 MED ORDER — HEPARIN SODIUM (PORCINE) 5000 UNIT/ML IJ SOLN
5000.0000 [IU] | Freq: Three times a day (TID) | INTRAMUSCULAR | Status: DC
  Administered 2017-04-11 – 2017-04-14 (×11): 5000 [IU] via SUBCUTANEOUS
  Filled 2017-04-11 (×11): qty 1

## 2017-04-11 MED ORDER — ACETAMINOPHEN 500 MG PO TABS
500.0000 mg | ORAL_TABLET | Freq: Four times a day (QID) | ORAL | Status: DC | PRN
  Administered 2017-04-11 – 2017-04-13 (×3): 500 mg via ORAL
  Filled 2017-04-11 (×3): qty 1

## 2017-04-11 MED ORDER — AMANTADINE HCL 100 MG PO CAPS
100.0000 mg | ORAL_CAPSULE | Freq: Two times a day (BID) | ORAL | Status: DC
  Administered 2017-04-11 – 2017-04-14 (×7): 100 mg via ORAL
  Filled 2017-04-11 (×9): qty 1

## 2017-04-11 MED ORDER — IOPAMIDOL (ISOVUE-370) INJECTION 76%
75.0000 mL | Freq: Once | INTRAVENOUS | Status: AC | PRN
  Administered 2017-04-11: 75 mL via INTRAVENOUS

## 2017-04-11 MED ORDER — DULOXETINE HCL 30 MG PO CPEP
30.0000 mg | ORAL_CAPSULE | Freq: Two times a day (BID) | ORAL | Status: DC
  Administered 2017-04-11 – 2017-04-14 (×7): 30 mg via ORAL
  Filled 2017-04-11 (×7): qty 1

## 2017-04-11 MED ORDER — DEXTROSE 5 % IV SOLN: 2.0000 g | INTRAVENOUS | Status: DC

## 2017-04-11 MED ORDER — COLLAGENASE 250 UNIT/GM EX OINT
TOPICAL_OINTMENT | Freq: Every day | CUTANEOUS | Status: DC
  Administered 2017-04-11 – 2017-04-14 (×4): via TOPICAL
  Filled 2017-04-11: qty 30

## 2017-04-11 MED ORDER — VANCOMYCIN HCL IN DEXTROSE 1-5 GM/200ML-% IV SOLN
1000.0000 mg | Freq: Three times a day (TID) | INTRAVENOUS | Status: DC
  Administered 2017-04-11: 1000 mg via INTRAVENOUS
  Filled 2017-04-11 (×4): qty 200

## 2017-04-11 MED ORDER — DOCUSATE SODIUM 100 MG PO CAPS
100.0000 mg | ORAL_CAPSULE | Freq: Two times a day (BID) | ORAL | Status: DC | PRN
  Administered 2017-04-13: 100 mg via ORAL
  Filled 2017-04-11: qty 1

## 2017-04-11 MED ORDER — SODIUM CHLORIDE 0.9 % IV SOLN
1.0000 g | Freq: Three times a day (TID) | INTRAVENOUS | Status: DC
  Administered 2017-04-11 – 2017-04-13 (×7): 1 g via INTRAVENOUS
  Filled 2017-04-11 (×9): qty 1

## 2017-04-11 MED ORDER — BACLOFEN 10 MG PO TABS
20.0000 mg | ORAL_TABLET | Freq: Three times a day (TID) | ORAL | Status: DC
  Administered 2017-04-11 – 2017-04-14 (×10): 20 mg via ORAL
  Filled 2017-04-11 (×4): qty 2
  Filled 2017-04-11: qty 1
  Filled 2017-04-11 (×2): qty 2
  Filled 2017-04-11: qty 1
  Filled 2017-04-11 (×3): qty 2
  Filled 2017-04-11: qty 1

## 2017-04-11 MED ORDER — VANCOMYCIN HCL 10 G IV SOLR
1250.0000 mg | Freq: Two times a day (BID) | INTRAVENOUS | Status: DC
  Filled 2017-04-11: qty 1250

## 2017-04-11 MED ORDER — GABAPENTIN 400 MG PO CAPS
800.0000 mg | ORAL_CAPSULE | Freq: Four times a day (QID) | ORAL | Status: DC
  Administered 2017-04-11 – 2017-04-14 (×13): 800 mg via ORAL
  Filled 2017-04-11 (×13): qty 2

## 2017-04-11 MED ORDER — VITAMIN D (ERGOCALCIFEROL) 1.25 MG (50000 UNIT) PO CAPS: 50000.0000 [IU] | ORAL_CAPSULE | ORAL | Status: DC

## 2017-04-11 NOTE — Progress Notes (Addendum)
Pharmacy Antibiotic Note  [name] is a 46 y.o. female admitted on 04/10/2017 with UTI.  Pharmacy has been consulted for meropenem and vancomycin dosing.  Plan: Meropenem 1 gm IV Q8H  Vancomycin dose modified to 1.25 gm IV Q12H - predicted trough 15 mcg/ml. Pharmacy will continue to follow and adjust as needed to maintain trough 15 to 20 mcg/mL.   Height: 5\' 4"  (162.6 cm) Weight: 219 lb 6.4 oz (99.5 kg) IBW/kg (Calculated) : 54.7  Temp (24hrs), Avg:100.3 F (37.9 C), Min:98.7 F (37.1 C), Max:103.1 F (39.5 C)   Recent Labs Lab 04/10/17 1857 04/11/17 0352  WBC 30.5* 28.8*  CREATININE 0.90 0.84  LATICACIDVEN 1.3 3.3*    Estimated Creatinine Clearance: 94.9 mL/min (by C-G formula based on SCr of 0.84 mg/dL).    Allergies  Allergen Reactions  . Latex Swelling and Other (See Comments)    Reaction:  Facial swelling      Thank you for allowing pharmacy to be a part of this patient's care.  Carola Frost, Pharm.D., BCPS Clinical Pharmacist 04/11/2017 8:11 AM

## 2017-04-11 NOTE — Consult Note (Signed)
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  PResent on admission.  Wound type:unstageable pressure injury Pressure Injury POA: Yes Measurement: 1 cm x 0.5 cm  X0.2 cm ruddy red with 25% slough to wound bed.  Drainage (amount, consistency, odor) minimal serosanguinous  No odor Periwound:generalized edema to lower legs Dressing procedure/placement/frequency:Cleanse wound to left leg with NS and pat gently dry.  Apply Santyl to wound bed.  COver with NS moist gauze.  Cover with 4x4 gauze and kerlix/tape.  Change daily. Send Santyl home with patient on discharge.  Will not follow at this time.  Please re-consult if needed.  Maple Hudson RN BSN CWON Pager [phone]

## 2017-04-11 NOTE — Progress Notes (Signed)
[employer] [facility] - [place] at [facility]                                                                                                                                                                                  Patient Demographics   [name], is a 46 y.o. female, DOB - [birth year], ZOX:[id number]  Admit date - 04/10/2017   Admitting Physician Altamese Dilling, MD  Outpatient Primary MD for the patient is Gladis Riffle, MD   LOS - 1  Subjective:  Pt feels better less confused Patient with MS had a mostly in a bedbound state. Has a suprapubic catheter in place  Review of Systems:   CONSTITUTIONAL: No documented fever. Positive fatigue, positive weakness. No weight gain, no weight loss.  EYES: No blurry or double vision.  ENT: No tinnitus. No postnasal drip. No redness of the oropharynx.  RESPIRATORY: No cough, no wheeze, no hemoptysis. No dyspnea.  CARDIOVASCULAR: No chest pain. No orthopnea. No palpitations. No syncope.  GASTROINTESTINAL: No nausea, no vomiting or diarrhea. No abdominal pain. No melena or hematochezia.  GENITOURINARY: No dysuria or hematuria.  ENDOCRINE: No polyuria or nocturia. No heat or cold intolerance.  HEMATOLOGY: No anemia. No bruising. No bleeding.  INTEGUMENTARY: No rashes. No lesions.  MUSCULOSKELETAL: No arthritis. No swelling. No gout.  NEUROLOGIC: No numbness, tingling, or ataxia. No seizure-type activity.  PSYCHIATRIC: No anxiety. No insomnia. No ADD.    Vitals:   Vitals:   04/11/17 0109 04/11/17 0402 04/11/17 0804 04/11/17 1241  BP: 134/68 (!) 150/60 128/63 (!) 138/55  Pulse: (!) 107 (!) 117 (!) 115 (!) 103  Resp: 18 16 14 18   Temp: 99.1 F (37.3 C) 99.4 F (37.4 C) 98.7 F (37.1 C) 99 F (37.2 C)  TempSrc: Oral Oral Oral Oral  SpO2: 100% 100% 97% 99%  Weight: 219 lb 6.4 oz (99.5 kg)     Height: 5\' 4"  (1.626 m)       Wt Readings from Last 3 Encounters:  04/11/17 219 lb 6.4 oz (99.5 kg)  01/20/17 200 lb (90.7  kg)  01/12/16 205 lb 6.4 oz (93.2 kg)     Intake/Output Summary (Last 24 hours) at 04/11/17 1407 Last data filed at 04/11/17 1354  Gross per 24 hour  Intake           7477.5 ml  Output             1150 ml  Net           6327.5 ml    Physical Exam:   GENERAL: Pleasant-appearing in no apparent distress.  HEAD, EYES, EARS, NOSE AND THROAT: Atraumatic, normocephalic. Extraocular muscles are  intact. Pupils equal and reactive to light. Sclerae anicteric. No conjunctival injection. No oro-pharyngeal erythema.  NECK: Supple. There is no jugular venous distention. No bruits, no lymphadenopathy, no thyromegaly.  HEART: Regular rate and rhythm,. No murmurs, no rubs, no clicks.  LUNGS: Clear to auscultation bilaterally. No rales or rhonchi. No wheezes.  ABDOMEN: Soft, flat, nontender, nondistended. Has good bowel sounds. No hepatosplenomegaly appreciated.  EXTREMITIES: No evidence of any cyanosis, clubbing, or peripheral edema.  +2 pedal and radial pulses bilaterally.  NEUROLOGIC: The patient is alert, awake, and oriented x3 with no focal motor or sensory deficits appreciated bilaterally.  SKIN: Moist and warm with no rashes appreciated.  Psych: Not anxious, depressed LN: No inguinal LN enlargement    Antibiotics   Anti-infectives    Start     Dose/Rate Route Frequency Ordered Stop   04/11/17 2000  cefTRIAXone (ROCEPHIN) 2 g in dextrose 5 % 50 mL IVPB  Status:  Discontinued     2 g 100 mL/hr over 30 Minutes Intravenous Every 24 hours 04/11/17 0328 04/11/17 0755   04/11/17 1800  vancomycin (VANCOCIN) 1,250 mg in sodium chloride 0.9 % 250 mL IVPB     1,250 mg 166.7 mL/hr over 90 Minutes Intravenous Every 12 hours 04/11/17 1049     04/11/17 0815  meropenem (MERREM) 1 g in sodium chloride 0.9 % 100 mL IVPB     1 g 200 mL/hr over 30 Minutes Intravenous Every 8 hours 04/11/17 0811     04/11/17 0330  vancomycin (VANCOCIN) IVPB 1000 mg/200 mL premix  Status:  Discontinued     1,000 mg 200  mL/hr over 60 Minutes Intravenous Every 8 hours 04/11/17 0328 04/11/17 1049   04/10/17 1945  vancomycin (VANCOCIN) IVPB 1000 mg/200 mL premix     1,000 mg 200 mL/hr over 60 Minutes Intravenous  Once 04/10/17 1939 04/10/17 2221   04/10/17 1915  acyclovir (ZOVIRAX) 550 mg in dextrose 5 % 100 mL IVPB     550 mg 111 mL/hr over 60 Minutes Intravenous  Once 04/10/17 1901 04/10/17 2110   04/10/17 1845  cefTRIAXone (ROCEPHIN) 2 g in dextrose 5 % 50 mL IVPB     2 g 100 mL/hr over 30 Minutes Intravenous  Once 04/10/17 1843 04/10/17 2024      Medications   Scheduled Meds: . amantadine  100 mg Oral BID  . baclofen  20 mg Oral TID  . collagenase   Topical Daily  . DULoxetine  30 mg Oral BID  . gabapentin  800 mg Oral QID  . heparin  5,000 Units Subcutaneous Q8H  . Vitamin D (Ergocalciferol)  50,000 Units Oral Q7 days   Continuous Infusions: . sodium chloride 75 mL/hr at 04/11/17 0944  . meropenem (MERREM) IV Stopped (04/11/17 1158)  . vancomycin     PRN Meds:.acetaminophen, docusate sodium   Data Review:   Micro Results Recent Results (from the past 240 hour(s))  Blood Culture (routine x 2)     Status: None (Preliminary result)   Collection Time: 04/10/17  6:57 PM  Result Value Ref Range Status   Specimen Description BLOOD RIGHT THUMB  Final   Special Requests   Final    BOTTLES DRAWN AEROBIC AND ANAEROBIC Blood Culture results may not be optimal due to an inadequate volume of blood received in culture bottles   Culture  Setup Time   Final    Organism ID to follow GRAM NEGATIVE RODS ANAEROBIC BOTTLE ONLY CRITICAL RESULT CALLED TO, READ BACK BY  AND VERIFIED WITH: [name] MERRILL 04/11/17 1210 KLW    Culture GRAM NEGATIVE RODS  Final   Report Status PENDING  Incomplete  CSF culture     Status: None (Preliminary result)   Collection Time: 04/10/17  6:57 PM  Result Value Ref Range Status   Specimen Description CSF  Final   Special Requests NONE  Final   Gram Stain   Final     CYTOSPIN SMEAR WBC PRESENT, PREDOMINANTLY MONONUCLEAR NO ORGANISMS SEEN Performed at [facility], [address] [place], [place] [address]    Culture PENDING  Incomplete   Report Status PENDING  Incomplete  Gram stain     Status: None   Collection Time: 04/10/17  6:57 PM  Result Value Ref Range Status   Specimen Description CSF  Final   Special Requests NONE  Final   Gram Stain RARE WBC NO ORGANISMS SEEN NO RBC SEEN   Final   Report Status 04/10/2017 FINAL  Final  Blood Culture ID Panel (Reflexed)     Status: Abnormal   Collection Time: 04/10/17  6:57 PM  Result Value Ref Range Status   Enterococcus species NOT DETECTED NOT DETECTED Final   Listeria monocytogenes NOT DETECTED NOT DETECTED Final   Staphylococcus species NOT DETECTED NOT DETECTED Final   Staphylococcus aureus NOT DETECTED NOT DETECTED Final   Streptococcus species NOT DETECTED NOT DETECTED Final   Streptococcus agalactiae NOT DETECTED NOT DETECTED Final   Streptococcus pneumoniae NOT DETECTED NOT DETECTED Final   Streptococcus pyogenes NOT DETECTED NOT DETECTED Final   Acinetobacter baumannii NOT DETECTED NOT DETECTED Final   Enterobacteriaceae species DETECTED (A) NOT DETECTED Final    Comment: Enterobacteriaceae represent a large family of gram-negative bacteria, not a single organism. CRITICAL RESULT CALLED TO, READ BACK BY AND VERIFIED WITH: [name] MERRILL 04/11/17 1210 KLW    Enterobacter cloacae complex NOT DETECTED NOT DETECTED Final   Escherichia coli NOT DETECTED NOT DETECTED Final   Klebsiella oxytoca NOT DETECTED NOT DETECTED Final   Klebsiella pneumoniae DETECTED (A) NOT DETECTED Final    Comment: CRITICAL RESULT CALLED TO, READ BACK BY AND VERIFIED WITH: [name] MERRILL 04/11/17 1210 KLW    Proteus species NOT DETECTED NOT DETECTED Final   Serratia marcescens NOT DETECTED NOT DETECTED Final   Carbapenem resistance NOT DETECTED NOT DETECTED Final   Haemophilus influenzae NOT DETECTED  NOT DETECTED Final   Neisseria meningitidis NOT DETECTED NOT DETECTED Final   Pseudomonas aeruginosa NOT DETECTED NOT DETECTED Final   Candida albicans NOT DETECTED NOT DETECTED Final   Candida glabrata NOT DETECTED NOT DETECTED Final   Candida krusei NOT DETECTED NOT DETECTED Final   Candida parapsilosis NOT DETECTED NOT DETECTED Final   Candida tropicalis NOT DETECTED NOT DETECTED Final  Blood Culture (routine x 2)     Status: None (Preliminary result)   Collection Time: 04/10/17  7:00 PM  Result Value Ref Range Status   Specimen Description BLOOD BLOOD RIGHT ARM  Final   Special Requests   Final    BOTTLES DRAWN AEROBIC AND ANAEROBIC Blood Culture adequate volume   Culture NO GROWTH < 12 HOURS  Final   Report Status PENDING  Incomplete    Radiology Reports Ct Head Wo Contrast  Result Date: 04/10/2017 CLINICAL DATA:  Altered mentation and fever. EXAM: CT HEAD WITHOUT CONTRAST TECHNIQUE: Contiguous axial images were obtained from the base of the skull through the vertex without intravenous contrast. COMPARISON:  MRI 04/22/2010 FINDINGS: Brain: No evidence  of acute infarction, hemorrhage, hydrocephalus, extra-axial collection or mass lesion/mass effect. Vascular: No hyperdense vessel or unexpected calcification. Skull: Normal. Negative for fracture or focal lesion. Sinuses/Orbits: No acute finding. Other: None. IMPRESSION: No acute intracranial abnormality. Electronically Signed   By: Tollie Eth M.D.   On: 04/10/2017 19:49   Ct Angio Chest Pe W Or Wo Contrast  Addendum Date: 04/11/2017   ADDENDUM REPORT: 04/11/2017 12:16 ADDENDUM: Study discussed by telephone with Dr. Serita Grit  on 04/11/2017 at 1158 hours. Electronically Signed   By: Odessa Fleming M.D.   On: 04/11/2017 12:16   Result Date: 04/11/2017 CLINICAL DATA:  46 year old female with right side abdominal pain and shortness of breath. EXAM: CT ANGIOGRAPHY CHEST WITH CONTRAST TECHNIQUE: Multidetector CT imaging of the chest was  performed using the standard protocol during bolus administration of intravenous contrast. Multiplanar CT image reconstructions and MIPs were obtained to evaluate the vascular anatomy. CONTRAST:  150 mL Isovue 370; in two 75 mL boluses due to poor initial contrast timing. Furthermore, on the second set of images contrast extravasation into the proximal right upper extremity soft tissues is evident (series 12, image 8). The extravasation encompasses 40 x 23 x 40 mm (AP by transverse by CC), for an estimated volume of 18 mL. COMPARISON:  Chest radiographs 04/10/2017 and earlier. CT Abdomen and Pelvis 01/21/2017. FINDINGS: Cardiovascular: Unfortunately contrast bolus timing in the pulmonary arterial tree is suboptimal on both attempts. The second contrast bolus is mildly improved over the first. There is no central or hilar pulmonary artery filling defect. No pericardial effusion. Negative visualized aorta. Cardiac size is within normal limits. Mediastinum/Nodes: Negative.  No lymphadenopathy. Lungs/Pleura: Trace retained secretions along the right lateral aspect of the distal trachea. Otherwise the major airways are patent. There is left lower lobe lateral basal segment and right lower lobe posterior basal segment confluent pulmonary opacity which is enhancing and similar to the April body CT most compatible with atelectasis. No pleural effusion. No other abnormal pulmonary opacity. Upper Abdomen: Negative visualized liver, spleen, adrenal glands, and bowel in the upper abdomen. Mildly heterogeneous appearance of the renal cortical enhancement on the initial contrast images, and on the repeat more delayed images both kidneys demonstrate a somewhat striated nephrogram (especially the left). Renal contrast excretion appears to be symmetric. No upper abdominal free fluid. Musculoskeletal: Sclerosis and periarticular regularity at the right sternoclavicular joint compatible with chronic arthritis. Bone mineralization  elsewhere is within normal limits. No acute osseous abnormality identified. Review of the MIP images confirms the above findings. IMPRESSION: 1. Poor contrast bolus timing in the pulmonary arteries despite two attempts. No central pulmonary embolus, but follow-up V/Q scan is recommended if clinical suspicion of PE persists. 2. Heterogeneous renal enhancement suspicious for Acute Pyelonephritis. 3. Small volume IV contrast extravasation into the proximal right upper extremity. 4. Chronic pulmonary lower lobe atelectasis. Electronically Signed: By: Odessa Fleming M.D. On: 04/11/2017 11:48   Dg Chest Port 1 View  Result Date: 04/10/2017 CLINICAL DATA:  Altered mental status and fever. EXAM: PORTABLE CHEST 1 VIEW COMPARISON:  01/20/2017 FINDINGS: 1940 hours. Limited study due to low volumes and kyphotic positioning. Within this limitation, the cardiopericardial silhouette appears enlarged. There is pulmonary vascular congestion. Right parahilar opacity has relatively well-defined margins. Probable basilar atelectasis bilaterally. The visualized bony structures of the thorax are intact. Telemetry leads overlie the chest. IMPRESSION: Right parahilar atelectasis or pneumonia. Given the low volume film, mass lesion cannot be excluded. Recommend dedicated PA and lateral views of the  chest when the patient is able to cooperate with upright positioning. Electronically Signed   By: Kennith Center M.D.   On: 04/10/2017 19:57     CBC  Recent Labs Lab 04/10/17 1857 04/11/17 0352  WBC 30.5* 28.8*  HGB 13.4 12.2  HCT 40.4 37.1  PLT 316 293  MCV 86.0 86.6  MCH 28.6 28.5  MCHC 33.3 32.9  RDW 15.4* 15.7*  LYMPHSABS 1.2  --   MONOABS 3.4*  --   EOSABS 0.0  --   BASOSABS 0.0  --     Chemistries   Recent Labs Lab 04/10/17 1857 04/11/17 0352  NA 133* 139  K 3.9 3.8  CL 99* 106  CO2 22 22  GLUCOSE 117* 117*  BUN 18 14  CREATININE 0.90 0.84  CALCIUM 9.4 8.7*  AST 23  --   ALT 16  --   ALKPHOS 67  --    BILITOT 1.7*  --    ------------------------------------------------------------------------------------------------------------------ estimated creatinine clearance is 94.9 mL/min (by C-G formula based on SCr of 0.84 mg/dL). ------------------------------------------------------------------------------------------------------------------ No results for input(s): HGBA1C in the last 72 hours. ------------------------------------------------------------------------------------------------------------------ No results for input(s): CHOL, HDL, LDLCALC, TRIG, CHOLHDL, LDLDIRECT in the last 72 hours. ------------------------------------------------------------------------------------------------------------------ No results for input(s): TSH, T4TOTAL, T3FREE, THYROIDAB in the last 72 hours.  Invalid input(s): FREET3 ------------------------------------------------------------------------------------------------------------------ No results for input(s): VITAMINB12, FOLATE, FERRITIN, TIBC, IRON, RETICCTPCT in the last 72 hours.  Coagulation profile  Recent Labs Lab 04/11/17 0352  INR 1.36    No results for input(s): DDIMER in the last 72 hours.  Cardiac Enzymes  Recent Labs Lab 04/10/17 1857 04/11/17 0352 04/11/17 0709  TROPONINI 0.09* 0.04* 0.11*   ------------------------------------------------------------------------------------------------------------------ Invalid input(s): POCBNP    Assessment & Plan   * SepsisPositive blood cultures for gram-negative rods Due to UTI await urine cultures    LP is done, no growth I started her on meropenem. Vancomycin     * MS and paralysis   Bed bound.   At baseline    Cont home meds.  *Questionable pneumonia on chest x-ray CT scan does not show pneumonia  *Tachycardia bedbound state patient at high risk of PE I ordered the CT scan however unable to obtain good results VQ scan is pending       Code Status Orders         Start     Ordered   04/11/17 0110  Full code  Continuous     04/11/17 0109    Code Status History    Date Active Date Inactive Code Status Order ID Comments User Context   01/20/2017  6:03 AM 01/24/2017  6:43 PM Full Code [id number]  Ihor Austin, MD ED   01/12/2016  4:48 PM 01/15/2016  6:24 PM Full Code [id number]  Houston Siren, MD ED   08/09/2015  5:24 PM 08/12/2015  9:35 PM Full Code [id number]  Marguarite Arbour, MD Inpatient   07/19/2015  2:54 PM 07/25/2015  1:51 PM Full Code [id number]  Gladis Riffle, MD Inpatient           Consults   none   DVT Prophylaxis  Lovenox   Lab Results  Component Value Date   PLT 293 04/11/2017     Time Spent in minutes    Greater than 50% of time spent in care coordination and counseling patient regarding the condition and plan of care.   Auburn Bilberry M.D on 04/11/2017 at 2:07 PM  Between 7am to 6pm - Pager -  [phone]  After 6pm go to [email or web address] - password [email or web address] [facility]   Office  [phone]

## 2017-04-11 NOTE — Progress Notes (Signed)
PHARMACY - PHYSICIAN COMMUNICATION CRITICAL VALUE ALERT - BLOOD CULTURE IDENTIFICATION (BCID)  Results for orders placed or performed during the hospital encounter of 04/10/17  Blood Culture ID Panel (Reflexed) (Collected: 04/10/2017  7:00 PM)  Result Value Ref Range   Enterococcus species NOT DETECTED NOT DETECTED   Listeria monocytogenes NOT DETECTED NOT DETECTED   Staphylococcus species DETECTED (A) NOT DETECTED   Staphylococcus aureus NOT DETECTED NOT DETECTED   Methicillin resistance DETECTED (A) NOT DETECTED   Streptococcus species NOT DETECTED NOT DETECTED   Streptococcus agalactiae NOT DETECTED NOT DETECTED   Streptococcus pneumoniae NOT DETECTED NOT DETECTED   Streptococcus pyogenes NOT DETECTED NOT DETECTED   Acinetobacter baumannii NOT DETECTED NOT DETECTED   Enterobacteriaceae species NOT DETECTED NOT DETECTED   Enterobacter cloacae complex NOT DETECTED NOT DETECTED   Escherichia coli NOT DETECTED NOT DETECTED   Klebsiella oxytoca NOT DETECTED NOT DETECTED   Klebsiella pneumoniae NOT DETECTED NOT DETECTED   Proteus species NOT DETECTED NOT DETECTED   Serratia marcescens NOT DETECTED NOT DETECTED   Haemophilus influenzae NOT DETECTED NOT DETECTED   Neisseria meningitidis NOT DETECTED NOT DETECTED   Pseudomonas aeruginosa NOT DETECTED NOT DETECTED   Candida albicans NOT DETECTED NOT DETECTED   Candida glabrata NOT DETECTED NOT DETECTED   Candida krusei NOT DETECTED NOT DETECTED   Candida parapsilosis NOT DETECTED NOT DETECTED   Candida tropicalis NOT DETECTED NOT DETECTED    Name of physician (or Provider) Contacted: text page sent to Dr. Retta Mac  Changes to prescribed antibiotics required: None- no reply to page- however possible contamination only growing in 1/4 bottles  Efraim Kaufmann D Maccia 04/11/2017  4:05 PM

## 2017-04-11 NOTE — Progress Notes (Signed)
Alert. Answers limited yes and no questions. Does not follow commands consistently. Bedbound. Urostomy in place and leaking. Pouch changed. IV fluids infusing. IV antibiotics given. Notified Dr. Sheryle Hail of lactic acid of 3.3. Fluids increased.

## 2017-04-11 NOTE — Care Management (Signed)
Presents from home with altered mental status.  found to have temp of 103 and elevated lactic acid.  Has hospital bed, lft, trapeze and wheelchair in the home.  No issues obtaining medications. Has inhome aide services Monday through Saturday approximately 28 hours a week. Patient with MS and her mother is primary caregiver.  Will anticipate need for ems transport at discharge.

## 2017-04-11 NOTE — Progress Notes (Addendum)
PHARMACY - PHYSICIAN COMMUNICATION CRITICAL VALUE ALERT - BLOOD CULTURE IDENTIFICATION (BCID)  Results for orders placed or performed during the hospital encounter of 04/10/17  Blood Culture ID Panel (Reflexed) (Collected: 04/10/2017  6:57 PM)  Result Value Ref Range   Enterococcus species NOT DETECTED NOT DETECTED   Listeria monocytogenes NOT DETECTED NOT DETECTED   Staphylococcus species NOT DETECTED NOT DETECTED   Staphylococcus aureus NOT DETECTED NOT DETECTED   Streptococcus species NOT DETECTED NOT DETECTED   Streptococcus agalactiae NOT DETECTED NOT DETECTED   Streptococcus pneumoniae NOT DETECTED NOT DETECTED   Streptococcus pyogenes NOT DETECTED NOT DETECTED   Acinetobacter baumannii NOT DETECTED NOT DETECTED   Enterobacteriaceae species DETECTED (A) NOT DETECTED   Enterobacter cloacae complex NOT DETECTED NOT DETECTED   Escherichia coli NOT DETECTED NOT DETECTED   Klebsiella oxytoca NOT DETECTED NOT DETECTED   Klebsiella pneumoniae DETECTED (A) NOT DETECTED   Proteus species NOT DETECTED NOT DETECTED   Serratia marcescens NOT DETECTED NOT DETECTED   Carbapenem resistance NOT DETECTED NOT DETECTED   Haemophilus influenzae NOT DETECTED NOT DETECTED   Neisseria meningitidis NOT DETECTED NOT DETECTED   Pseudomonas aeruginosa NOT DETECTED NOT DETECTED   Candida albicans NOT DETECTED NOT DETECTED   Candida glabrata NOT DETECTED NOT DETECTED   Candida krusei NOT DETECTED NOT DETECTED   Candida parapsilosis NOT DETECTED NOT DETECTED   Candida tropicalis NOT DETECTED NOT DETECTED    Name of physician (or Provider) Contacted: Dr. Retta Mac  Changes to prescribed antibiotics required: Continue meropenem, may discontinue vancomycin.  Carola Frost, Pharm.D., BCPS Clinical pharmacist 04/11/2017  3:01 PM    [employer] (BCID) Data:  Lab called Pharmacy with a BCID, Blood cx, result of:  1 bottle (anaerobic) of 4 bottles is positive for Gram Negative Rods, + Klebsiella  Pneumoniae.   Called information to clinical Pharmacist, Morton Stall.   Patient currently on Meropenem and Vancomycin.  Bari Mantis PharmD Clinical Pharmacist 04/11/2017

## 2017-04-11 NOTE — Progress Notes (Signed)
Pharmacy Antibiotic Note  [name] is a 46 y.o. female admitted on 04/10/2017 with sepsis.  Pharmacy has been consulted for vanc/ceftriaxone dosing.  Plan: Patient received vanc 1g IV x 1 in ED   Will follow up w/ vanc 1g IV q8h w/ 6 hour stack dose. Will check a vanc trough 7/18 0230 prior to 4th dose. Goal trough 15 - 20 mcg/mL Ke 0.078 T1/2 ~ 8 hours  Will continue ceftriaxone 2g IV daily for recurrent UTIs and possible urosepsis.  Height: 5\' 4"  (162.6 cm) Weight: 219 lb 6.4 oz (99.5 kg) IBW/kg (Calculated) : 54.7  Temp (24hrs), Avg:100.5 F (38.1 C), Min:99.1 F (37.3 C), Max:103.1 F (39.5 C)   Recent Labs Lab 04/10/17 1857  WBC 30.5*  CREATININE 0.90  LATICACIDVEN 1.3    Estimated Creatinine Clearance: 88.6 mL/min (by C-G formula based on SCr of 0.9 mg/dL).    Allergies  Allergen Reactions  . Latex Swelling and Other (See Comments)    Reaction:  Facial swelling      Thank you for allowing pharmacy to be a part of this patient's care.  Thomasene Ripple, PharmD, BCPS Clinical Pharmacist 04/11/2017

## 2017-04-12 LAB — URINE CULTURE

## 2017-04-12 LAB — CBC
HCT: 34 % — ABNORMAL LOW (ref 35.0–47.0)
HEMOGLOBIN: 11.3 g/dL — AB (ref 12.0–16.0)
MCH: 29 pg (ref 26.0–34.0)
MCHC: 33.2 g/dL (ref 32.0–36.0)
MCV: 87.3 fL (ref 80.0–100.0)
Platelets: 238 10*3/uL (ref 150–440)
RBC: 3.9 MIL/uL (ref 3.80–5.20)
RDW: 15.3 % — ABNORMAL HIGH (ref 11.5–14.5)
WBC: 13.2 10*3/uL — ABNORMAL HIGH (ref 3.6–11.0)

## 2017-04-12 LAB — ECHOCARDIOGRAM COMPLETE
HEIGHTINCHES: 64 in
Weight: 3510.4 oz

## 2017-04-12 LAB — BASIC METABOLIC PANEL
Anion gap: 7 (ref 5–15)
BUN: 7 mg/dL (ref 6–20)
CHLORIDE: 108 mmol/L (ref 101–111)
CO2: 25 mmol/L (ref 22–32)
CREATININE: 0.88 mg/dL (ref 0.44–1.00)
Calcium: 8.5 mg/dL — ABNORMAL LOW (ref 8.9–10.3)
GFR calc Af Amer: >60 mL/min (ref >60)
GFR calc non Af Amer: >60 mL/min (ref >60)
GLUCOSE: 108 mg/dL — AB (ref 65–99)
Potassium: 2.9 mmol/L — ABNORMAL LOW (ref 3.5–5.1)
SODIUM: 140 mmol/L (ref 135–145)

## 2017-04-12 LAB — MAGNESIUM: Magnesium: 2.2 mg/dL (ref 1.7–2.4)

## 2017-04-12 LAB — HIV ANTIBODY (ROUTINE TESTING W REFLEX): HIV SCREEN 4TH GENERATION: NONREACTIVE

## 2017-04-12 MED ORDER — POTASSIUM CHLORIDE CRYS ER 20 MEQ PO TBCR
40.0000 meq | EXTENDED_RELEASE_TABLET | ORAL | Status: AC
  Administered 2017-04-12 (×2): 40 meq via ORAL
  Filled 2017-04-12 (×2): qty 2

## 2017-04-12 MED ORDER — POTASSIUM CHLORIDE CRYS ER 20 MEQ PO TBCR
40.0000 meq | EXTENDED_RELEASE_TABLET | Freq: Two times a day (BID) | ORAL | Status: DC
  Administered 2017-04-12 – 2017-04-14 (×4): 40 meq via ORAL
  Filled 2017-04-12 (×4): qty 2

## 2017-04-12 MED ORDER — POTASSIUM CHLORIDE CRYS ER 20 MEQ PO TBCR: 40.0000 meq | EXTENDED_RELEASE_TABLET | ORAL | Status: DC

## 2017-04-12 MED ORDER — POTASSIUM CHLORIDE 20 MEQ PO PACK: 40.0000 meq | PACK | Freq: Two times a day (BID) | ORAL | Status: DC

## 2017-04-12 NOTE — Progress Notes (Signed)
[employer] - [place] at [facility]                                                                                                                                                                                  Patient Demographics   [name], is a 46 y.o. female, DOB - 01/04/1971, MVH:[id number]  Admit date - 04/10/2017   Admitting Physician Altamese Dilling, MD  Outpatient Primary MD for the patient is Gladis Riffle, MD   LOS - 2  Subjective:  Patient feeling better alert  Review of Systems:   CONSTITUTIONAL: No documented fever. Positive fatigue, positive weakness. No weight gain, no weight loss.  EYES: No blurry or double vision.  ENT: No tinnitus. No postnasal drip. No redness of the oropharynx.  RESPIRATORY: No cough, no wheeze, no hemoptysis. No dyspnea.  CARDIOVASCULAR: No chest pain. No orthopnea. No palpitations. No syncope.  GASTROINTESTINAL: No nausea, no vomiting or diarrhea. No abdominal pain. No melena or hematochezia.  GENITOURINARY: No dysuria or hematuria.  ENDOCRINE: No polyuria or nocturia. No heat or cold intolerance.  HEMATOLOGY: No anemia. No bruising. No bleeding.  INTEGUMENTARY: No rashes. No lesions.  MUSCULOSKELETAL: No arthritis. No swelling. No gout.  NEUROLOGIC: No numbness, tingling, or ataxia. No seizure-type activity.  PSYCHIATRIC: No anxiety. No insomnia. No ADD.    Vitals:   Vitals:   04/11/17 1759 04/12/17 0415 04/12/17 0813 04/12/17 1333  BP:  (!) 110/39 (!) 126/57 128/74  Pulse:  94 96 (!) 104  Resp:   (!) 22 14  Temp: (!) 100.6 F (38.1 C) 97.7 F (36.5 C) 98.1 F (36.7 C)   TempSrc: Oral Oral Oral   SpO2:  100% 98% 97%  Weight:      Height:        Wt Readings from Last 3 Encounters:  04/11/17 219 lb 6.4 oz (99.5 kg)  01/20/17 200 lb (90.7 kg)  01/12/16 205 lb 6.4 oz (93.2 kg)     Intake/Output Summary (Last 24 hours) at 04/12/17 1405 Last data filed at 04/12/17 1000  Gross per 24 hour  Intake               540 ml  Output             1550 ml  Net            -1010 ml    Physical Exam:   GENERAL: Pleasant-appearing in no apparent distress.  HEAD, EYES, EARS, NOSE AND THROAT: Atraumatic, normocephalic. Extraocular muscles are intact. Pupils equal and reactive to light. Sclerae anicteric. No conjunctival injection. No oro-pharyngeal erythema.  NECK: Supple. There is no jugular venous distention. No  bruits, no lymphadenopathy, no thyromegaly.  HEART: Regular rate and rhythm,. No murmurs, no rubs, no clicks.  LUNGS: Clear to auscultation bilaterally. No rales or rhonchi. No wheezes.  ABDOMEN: Soft, flat, nontender, nondistended. Has good bowel sounds. No hepatosplenomegaly appreciated.  EXTREMITIES: No evidence of any cyanosis, clubbing, or peripheral edema.  +2 pedal and radial pulses bilaterally.  NEUROLOGIC: The patient is alert, awake, and oriented x3 with no focal motor or sensory deficits appreciated bilaterally.  SKIN: Moist and warm with no rashes appreciated.  Psych: Not anxious, depressed LN: No inguinal LN enlargement    Antibiotics   Anti-infectives    Start     Dose/Rate Route Frequency Ordered Stop   04/11/17 2000  cefTRIAXone (ROCEPHIN) 2 g in dextrose 5 % 50 mL IVPB  Status:  Discontinued     2 g 100 mL/hr over 30 Minutes Intravenous Every 24 hours 04/11/17 0328 04/11/17 0755   04/11/17 1800  vancomycin (VANCOCIN) 1,250 mg in sodium chloride 0.9 % 250 mL IVPB  Status:  Discontinued     1,250 mg 166.7 mL/hr over 90 Minutes Intravenous Every 12 hours 04/11/17 1049 04/11/17 1441   04/11/17 0815  meropenem (MERREM) 1 g in sodium chloride 0.9 % 100 mL IVPB     1 g 200 mL/hr over 30 Minutes Intravenous Every 8 hours 04/11/17 0811     04/11/17 0330  vancomycin (VANCOCIN) IVPB 1000 mg/200 mL premix  Status:  Discontinued     1,000 mg 200 mL/hr over 60 Minutes Intravenous Every 8 hours 04/11/17 0328 04/11/17 1049   04/10/17 1945  vancomycin (VANCOCIN) IVPB 1000 mg/200 mL  premix     1,000 mg 200 mL/hr over 60 Minutes Intravenous  Once 04/10/17 1939 04/10/17 2221   04/10/17 1915  acyclovir (ZOVIRAX) 550 mg in dextrose 5 % 100 mL IVPB     550 mg 111 mL/hr over 60 Minutes Intravenous  Once 04/10/17 1901 04/10/17 2110   04/10/17 1845  cefTRIAXone (ROCEPHIN) 2 g in dextrose 5 % 50 mL IVPB     2 g 100 mL/hr over 30 Minutes Intravenous  Once 04/10/17 1843 04/10/17 2024      Medications   Scheduled Meds: . amantadine  100 mg Oral BID  . baclofen  20 mg Oral TID  . collagenase   Topical Daily  . DULoxetine  30 mg Oral BID  . gabapentin  800 mg Oral QID  . heparin  5,000 Units Subcutaneous Q8H  . potassium chloride  40 mEq Oral BID  . Vitamin D (Ergocalciferol)  50,000 Units Oral Q7 days   Continuous Infusions: . meropenem (MERREM) IV Stopped (04/12/17 0930)   PRN Meds:.acetaminophen, docusate sodium   Data Review:   Micro Results Recent Results (from the past 240 hour(s))  Blood Culture (routine x 2)     Status: Abnormal (Preliminary result)   Collection Time: 04/10/17  6:57 PM  Result Value Ref Range Status   Specimen Description BLOOD RIGHT THUMB  Final   Special Requests   Final    BOTTLES DRAWN AEROBIC AND ANAEROBIC Blood Culture results may not be optimal due to an inadequate volume of blood received in culture bottles   Culture  Setup Time   Final    GRAM NEGATIVE RODS ANAEROBIC BOTTLE ONLY CRITICAL RESULT CALLED TO, READ BACK BY AND VERIFIED WITH: [name] MERRILL 04/11/17 1210 KLW    Culture (A)  Final    KLEBSIELLA PNEUMONIAE SUSCEPTIBILITIES TO FOLLOW Performed at [facility], [address] [place], [place] [address]    Report Status PENDING  Incomplete  Urine culture     Status: Abnormal   Collection Time: 04/10/17  6:57 PM  Result Value Ref Range Status   Specimen Description URINE, RANDOM  Final   Special Requests NONE  Final   Culture MULTIPLE SPECIES PRESENT, SUGGEST RECOLLECTION (A)  Final   Report Status  04/12/2017 FINAL  Final  CSF culture     Status: None (Preliminary result)   Collection Time: 04/10/17  6:57 PM  Result Value Ref Range Status   Specimen Description CSF  Final   Special Requests NONE  Final   Gram Stain   Final    CYTOSPIN SMEAR WBC PRESENT, PREDOMINANTLY MONONUCLEAR NO ORGANISMS SEEN    Culture   Final    NO GROWTH 2 DAYS Performed at [facility], [address], [place], [place] [address]    Report Status PENDING  Incomplete  Gram stain     Status: None   Collection Time: 04/10/17  6:57 PM  Result Value Ref Range Status   Specimen Description CSF  Final   Special Requests NONE  Final   Gram Stain RARE WBC NO ORGANISMS SEEN NO RBC SEEN   Final   Report Status 04/10/2017 FINAL  Final  Blood Culture ID Panel (Reflexed)     Status: Abnormal   Collection Time: 04/10/17  6:57 PM  Result Value Ref Range Status   Enterococcus species NOT DETECTED NOT DETECTED Final   Listeria monocytogenes NOT DETECTED NOT DETECTED Final   Staphylococcus species NOT DETECTED NOT DETECTED Final   Staphylococcus aureus NOT DETECTED NOT DETECTED Final   Streptococcus species NOT DETECTED NOT DETECTED Final   Streptococcus agalactiae NOT DETECTED NOT DETECTED Final   Streptococcus pneumoniae NOT DETECTED NOT DETECTED Final   Streptococcus pyogenes NOT DETECTED NOT DETECTED Final   Acinetobacter baumannii NOT DETECTED NOT DETECTED Final   Enterobacteriaceae species DETECTED (A) NOT DETECTED Final    Comment: Enterobacteriaceae represent a large family of gram-negative bacteria, not a single organism. CRITICAL RESULT CALLED TO, READ BACK BY AND VERIFIED WITH: [name] MERRILL 04/11/17 1210 KLW    Enterobacter cloacae complex NOT DETECTED NOT DETECTED Final   Escherichia coli NOT DETECTED NOT DETECTED Final   Klebsiella oxytoca NOT DETECTED NOT DETECTED Final   Klebsiella pneumoniae DETECTED (A) NOT DETECTED Final    Comment: CRITICAL RESULT CALLED TO, READ BACK BY AND  VERIFIED WITH: [name] MERRILL 04/11/17 1210 KLW    Proteus species NOT DETECTED NOT DETECTED Final   Serratia marcescens NOT DETECTED NOT DETECTED Final   Carbapenem resistance NOT DETECTED NOT DETECTED Final   Haemophilus influenzae NOT DETECTED NOT DETECTED Final   Neisseria meningitidis NOT DETECTED NOT DETECTED Final   Pseudomonas aeruginosa NOT DETECTED NOT DETECTED Final   Candida albicans NOT DETECTED NOT DETECTED Final   Candida glabrata NOT DETECTED NOT DETECTED Final   Candida krusei NOT DETECTED NOT DETECTED Final   Candida parapsilosis NOT DETECTED NOT DETECTED Final   Candida tropicalis NOT DETECTED NOT DETECTED Final  Blood Culture (routine x 2)     Status: None (Preliminary result)   Collection Time: 04/10/17  7:00 PM  Result Value Ref Range Status   Specimen Description BLOOD BLOOD RIGHT ARM  Final   Special Requests   Final    BOTTLES DRAWN AEROBIC AND ANAEROBIC Blood Culture adequate volume   Culture  Setup Time   Final    Organism  ID to follow GRAM POSITIVE COCCI IN BOTH AEROBIC AND ANAEROBIC BOTTLES CRITICAL RESULT CALLED TO, READ BACK BY AND VERIFIED WITH: KAREN HAYES 04/11/17 1549 KLW    Culture   Final    GRAM POSITIVE COCCI TOO YOUNG TO READ Performed at [facility], [address] [place], [place] [address]    Report Status PENDING  Incomplete  Blood Culture ID Panel (Reflexed)     Status: Abnormal   Collection Time: 04/10/17  7:00 PM  Result Value Ref Range Status   Enterococcus species NOT DETECTED NOT DETECTED Final   Listeria monocytogenes NOT DETECTED NOT DETECTED Final   Staphylococcus species DETECTED (A) NOT DETECTED Final    Comment: Methicillin (oxacillin) resistant coagulase negative staphylococcus. Possible blood culture contaminant (unless isolated from more than one blood culture draw or clinical case suggests pathogenicity). No antibiotic treatment is indicated for blood  culture contaminants. CRITICAL RESULT CALLED TO, READ  BACK BY AND VERIFIED WITH: KAREN HAYES 04/11/17 1549 KLW    Staphylococcus aureus NOT DETECTED NOT DETECTED Final   Methicillin resistance DETECTED (A) NOT DETECTED Final    Comment: CRITICAL RESULT CALLED TO, READ BACK BY AND VERIFIED WITH: KAREN HAYES 04/11/17 1550 KLW    Streptococcus species NOT DETECTED NOT DETECTED Final   Streptococcus agalactiae NOT DETECTED NOT DETECTED Final   Streptococcus pneumoniae NOT DETECTED NOT DETECTED Final   Streptococcus pyogenes NOT DETECTED NOT DETECTED Final   Acinetobacter baumannii NOT DETECTED NOT DETECTED Final   Enterobacteriaceae species NOT DETECTED NOT DETECTED Final   Enterobacter cloacae complex NOT DETECTED NOT DETECTED Final   Escherichia coli NOT DETECTED NOT DETECTED Final   Klebsiella oxytoca NOT DETECTED NOT DETECTED Final   Klebsiella pneumoniae NOT DETECTED NOT DETECTED Final   Proteus species NOT DETECTED NOT DETECTED Final   Serratia marcescens NOT DETECTED NOT DETECTED Final   Haemophilus influenzae NOT DETECTED NOT DETECTED Final   Neisseria meningitidis NOT DETECTED NOT DETECTED Final   Pseudomonas aeruginosa NOT DETECTED NOT DETECTED Final   Candida albicans NOT DETECTED NOT DETECTED Final   Candida glabrata NOT DETECTED NOT DETECTED Final   Candida krusei NOT DETECTED NOT DETECTED Final   Candida parapsilosis NOT DETECTED NOT DETECTED Final   Candida tropicalis NOT DETECTED NOT DETECTED Final    Radiology Reports Ct Head Wo Contrast  Result Date: 04/10/2017 CLINICAL DATA:  Altered mentation and fever. EXAM: CT HEAD WITHOUT CONTRAST TECHNIQUE: Contiguous axial images were obtained from the base of the skull through the vertex without intravenous contrast. COMPARISON:  MRI 04/22/2010 FINDINGS: Brain: No evidence of acute infarction, hemorrhage, hydrocephalus, extra-axial collection or mass lesion/mass effect. Vascular: No hyperdense vessel or unexpected calcification. Skull: Normal. Negative for fracture or focal lesion.  Sinuses/Orbits: No acute finding. Other: None. IMPRESSION: No acute intracranial abnormality. Electronically Signed   By: Tollie Eth M.D.   On: 04/10/2017 19:49   Ct Angio Chest Pe W Or Wo Contrast  Addendum Date: 04/11/2017   ADDENDUM REPORT: 04/11/2017 12:16 ADDENDUM: Study discussed by telephone with Dr. Serita Grit  on 04/11/2017 at 1158 hours. Electronically Signed   By: Odessa Fleming M.D.   On: 04/11/2017 12:16   Result Date: 04/11/2017 CLINICAL DATA:  46 year old female with right side abdominal pain and shortness of breath. EXAM: CT ANGIOGRAPHY CHEST WITH CONTRAST TECHNIQUE: Multidetector CT imaging of the chest was performed using the standard protocol during bolus administration of intravenous contrast. Multiplanar CT image reconstructions and MIPs were obtained to evaluate the vascular anatomy.  CONTRAST:  150 mL Isovue 370; in two 75 mL boluses due to poor initial contrast timing. Furthermore, on the second set of images contrast extravasation into the proximal right upper extremity soft tissues is evident (series 12, image 8). The extravasation encompasses 40 x 23 x 40 mm (AP by transverse by CC), for an estimated volume of 18 mL. COMPARISON:  Chest radiographs 04/10/2017 and earlier. CT Abdomen and Pelvis 01/21/2017. FINDINGS: Cardiovascular: Unfortunately contrast bolus timing in the pulmonary arterial tree is suboptimal on both attempts. The second contrast bolus is mildly improved over the first. There is no central or hilar pulmonary artery filling defect. No pericardial effusion. Negative visualized aorta. Cardiac size is within normal limits. Mediastinum/Nodes: Negative.  No lymphadenopathy. Lungs/Pleura: Trace retained secretions along the right lateral aspect of the distal trachea. Otherwise the major airways are patent. There is left lower lobe lateral basal segment and right lower lobe posterior basal segment confluent pulmonary opacity which is enhancing and similar to the April body CT  most compatible with atelectasis. No pleural effusion. No other abnormal pulmonary opacity. Upper Abdomen: Negative visualized liver, spleen, adrenal glands, and bowel in the upper abdomen. Mildly heterogeneous appearance of the renal cortical enhancement on the initial contrast images, and on the repeat more delayed images both kidneys demonstrate a somewhat striated nephrogram (especially the left). Renal contrast excretion appears to be symmetric. No upper abdominal free fluid. Musculoskeletal: Sclerosis and periarticular regularity at the right sternoclavicular joint compatible with chronic arthritis. Bone mineralization elsewhere is within normal limits. No acute osseous abnormality identified. Review of the MIP images confirms the above findings. IMPRESSION: 1. Poor contrast bolus timing in the pulmonary arteries despite two attempts. No central pulmonary embolus, but follow-up V/Q scan is recommended if clinical suspicion of PE persists. 2. Heterogeneous renal enhancement suspicious for Acute Pyelonephritis. 3. Small volume IV contrast extravasation into the proximal right upper extremity. 4. Chronic pulmonary lower lobe atelectasis. Electronically Signed: By: Odessa Fleming M.D. On: 04/11/2017 11:48   Dg Chest Port 1 View  Result Date: 04/10/2017 CLINICAL DATA:  Altered mental status and fever. EXAM: PORTABLE CHEST 1 VIEW COMPARISON:  01/20/2017 FINDINGS: 1940 hours. Limited study due to low volumes and kyphotic positioning. Within this limitation, the cardiopericardial silhouette appears enlarged. There is pulmonary vascular congestion. Right parahilar opacity has relatively well-defined margins. Probable basilar atelectasis bilaterally. The visualized bony structures of the thorax are intact. Telemetry leads overlie the chest. IMPRESSION: Right parahilar atelectasis or pneumonia. Given the low volume film, mass lesion cannot be excluded. Recommend dedicated PA and lateral views of the chest when the patient  is able to cooperate with upright positioning. Electronically Signed   By: Kennith Center M.D.   On: 04/10/2017 19:57     CBC  Recent Labs Lab 04/10/17 1857 04/11/17 0352 04/12/17 0341  WBC 30.5* 28.8* 13.2*  HGB 13.4 12.2 11.3*  HCT 40.4 37.1 34.0*  PLT 316 293 238  MCV 86.0 86.6 87.3  MCH 28.6 28.5 29.0  MCHC 33.3 32.9 33.2  RDW 15.4* 15.7* 15.3*  LYMPHSABS 1.2  --   --   MONOABS 3.4*  --   --   EOSABS 0.0  --   --   BASOSABS 0.0  --   --     Chemistries   Recent Labs Lab 04/10/17 1857 04/11/17 0352 04/12/17 0341  NA 133* 139 140  K 3.9 3.8 2.9*  CL 99* 106 108  CO2 22 22 25   GLUCOSE 117* 117* 108*  BUN 18 14 7   CREATININE 0.90 0.84 0.88  CALCIUM 9.4 8.7* 8.5*  MG  --   --  2.2  AST 23  --   --   ALT 16  --   --   ALKPHOS 67  --   --   BILITOT 1.7*  --   --    ------------------------------------------------------------------------------------------------------------------ estimated creatinine clearance is 90.6 mL/min (by C-G formula based on SCr of 0.88 mg/dL). ------------------------------------------------------------------------------------------------------------------ No results for input(s): HGBA1C in the last 72 hours. ------------------------------------------------------------------------------------------------------------------ No results for input(s): CHOL, HDL, LDLCALC, TRIG, CHOLHDL, LDLDIRECT in the last 72 hours. ------------------------------------------------------------------------------------------------------------------ No results for input(s): TSH, T4TOTAL, T3FREE, THYROIDAB in the last 72 hours.  Invalid input(s): FREET3 ------------------------------------------------------------------------------------------------------------------ No results for input(s): VITAMINB12, FOLATE, FERRITIN, TIBC, IRON, RETICCTPCT in the last 72 hours.  Coagulation profile  Recent Labs Lab 04/11/17 0352  INR 1.36    No results for input(s):  DDIMER in the last 72 hours.  Cardiac Enzymes  Recent Labs Lab 04/11/17 0352 04/11/17 0709 04/11/17 1518  TROPONINI 0.04* 0.11* <0.03   ------------------------------------------------------------------------------------------------------------------ Invalid input(s): POCBNP    Assessment & Plan   * Sepsis due to Klebsiella pneumonia  Due to UTI   urine culture showed multi-growth    Continue meropenem  * MS and paralysis   Bed bound.   At baseline    Cont home meds.  *Questionable pneumonia on chest x-ray CT scan does not show pneumonia  *Tachycardia bedbound tachycardia now resolved CT scan shows no major PE  *Elevated heart rate due to demand ischemia  *Hypokalemia replace and recheck      Code Status Orders        Start     Ordered   04/11/17 0110  Full code  Continuous     04/11/17 0109    Code Status History    Date Active Date Inactive Code Status Order ID Comments User Context   01/20/2017  6:03 AM 01/24/2017  6:43 PM Full Code [id number]  Ihor Austin, MD ED   01/12/2016  4:48 PM 01/15/2016  6:24 PM Full Code [id number]  Houston Siren, MD ED   08/09/2015  5:24 PM 08/12/2015  9:35 PM Full Code [id number]  Marguarite Arbour, MD Inpatient   07/19/2015  2:54 PM 07/25/2015  1:51 PM Full Code [id number]  Gladis Riffle, MD Inpatient           Consults   none   DVT Prophylaxis  Lovenox   Lab Results  Component Value Date   PLT 238 04/12/2017     Time Spent in minutes    Greater than 50% of time spent in care coordination and counseling patient regarding the condition and plan of care.   Auburn Bilberry M.D on 04/12/2017 at 2:05 PM  Between 7am to 6pm - Pager - [phone]  After 6pm go to [email or web address] - password [email or web address] [facility]  [facility]   Office  [phone]

## 2017-04-12 NOTE — Progress Notes (Signed)
Notified MD Sheryle Hail of potassium 2.9; MD to place orders. Nursing staff will continue to monitor for any changes in patient status. Lamonte Richer, RN

## 2017-04-13 LAB — BASIC METABOLIC PANEL
ANION GAP: 3 — AB (ref 5–15)
BUN: 8 mg/dL (ref 6–20)
CO2: 26 mmol/L (ref 22–32)
Calcium: 8.7 mg/dL — ABNORMAL LOW (ref 8.9–10.3)
Chloride: 112 mmol/L — ABNORMAL HIGH (ref 101–111)
Creatinine, Ser: 0.78 mg/dL (ref 0.44–1.00)
Glucose, Bld: 101 mg/dL — ABNORMAL HIGH (ref 65–99)
Potassium: 4.2 mmol/L (ref 3.5–5.1)
Sodium: 141 mmol/L (ref 135–145)

## 2017-04-13 LAB — CBC
HCT: 30.6 % — ABNORMAL LOW (ref 35.0–47.0)
Hemoglobin: 10.2 g/dL — ABNORMAL LOW (ref 12.0–16.0)
MCH: 28.6 pg (ref 26.0–34.0)
MCHC: 33.5 g/dL (ref 32.0–36.0)
MCV: 85.4 fL (ref 80.0–100.0)
PLATELETS: 274 10*3/uL (ref 150–440)
RBC: 3.58 MIL/uL — ABNORMAL LOW (ref 3.80–5.20)
RDW: 15.5 % — AB (ref 11.5–14.5)
WBC: 9.7 10*3/uL (ref 3.6–11.0)

## 2017-04-13 LAB — CULTURE, BLOOD (ROUTINE X 2): Special Requests: ADEQUATE

## 2017-04-13 LAB — HSV DNA BY PCR (REFERENCE LAB)
HSV 1 DNA: NEGATIVE
HSV 2 DNA: NEGATIVE

## 2017-04-13 LAB — MAGNESIUM: MAGNESIUM: 2.1 mg/dL (ref 1.7–2.4)

## 2017-04-13 MED ORDER — SODIUM CHLORIDE 0.9% FLUSH
3.0000 mL | Freq: Two times a day (BID) | INTRAVENOUS | Status: DC
  Administered 2017-04-13 – 2017-04-14 (×2): 3 mL via INTRAVENOUS

## 2017-04-13 MED ORDER — DEXTROSE 5 % IV SOLN
1.0000 g | INTRAVENOUS | Status: AC
  Administered 2017-04-13: 1 g via INTRAVENOUS
  Filled 2017-04-13: qty 10

## 2017-04-13 MED ORDER — ASPIRIN EC 325 MG PO TBEC
325.0000 mg | DELAYED_RELEASE_TABLET | Freq: Every day | ORAL | Status: DC
  Administered 2017-04-13 – 2017-04-14 (×2): 325 mg via ORAL
  Filled 2017-04-13 (×2): qty 1

## 2017-04-13 MED ORDER — POLYETHYLENE GLYCOL 3350 17 G PO PACK
17.0000 g | PACK | Freq: Every day | ORAL | Status: DC
  Administered 2017-04-13 – 2017-04-14 (×2): 17 g via ORAL
  Filled 2017-04-13 (×2): qty 1

## 2017-04-13 MED ORDER — CEFTRIAXONE SODIUM 1 G IJ SOLR
1.0000 g | INTRAMUSCULAR | Status: DC
  Administered 2017-04-13: 1 g via INTRAVENOUS
  Filled 2017-04-13: qty 10

## 2017-04-13 MED ORDER — DEXTROSE 5 % IV SOLN
2.0000 g | INTRAVENOUS | Status: DC
  Administered 2017-04-14: 2 g via INTRAVENOUS
  Filled 2017-04-13 (×2): qty 2

## 2017-04-13 NOTE — Progress Notes (Signed)
Antibiotic dose adjustment:  Order for ceftriaxone 1 g IV daily changed to 2 g IV daily for bacteremia. Per protocol.  Cindi Carbon, PharmD 04/13/17 12:39 PM

## 2017-04-13 NOTE — Progress Notes (Signed)
[facility] [place] at [facility]                                                                                                                                                                                  Patient Demographics   [name], is a 46 y.o. female, DOB - 12/10/1970, OYD:[id number]  Admit date - 04/10/2017   Admitting Physician Altamese Dilling, MD  Outpatient Primary MD for the patient is Gladis Riffle, MD   LOS - 3  Subjective:  Feeling better. Denies any complaints  Review of Systems:   CONSTITUTIONAL: No documented fever. Positive fatigue, positive weakness. No weight gain, no weight loss.  EYES: No blurry or double vision.  ENT: No tinnitus. No postnasal drip. No redness of the oropharynx.  RESPIRATORY: No cough, no wheeze, no hemoptysis. No dyspnea.  CARDIOVASCULAR: No chest pain. No orthopnea. No palpitations. No syncope.  GASTROINTESTINAL: No nausea, no vomiting or diarrhea. No abdominal pain. No melena or hematochezia.  GENITOURINARY: No dysuria or hematuria.  ENDOCRINE: No polyuria or nocturia. No heat or cold intolerance.  HEMATOLOGY: No anemia. No bruising. No bleeding.  INTEGUMENTARY: No rashes. No lesions.  MUSCULOSKELETAL: No arthritis. No swelling. No gout.  NEUROLOGIC: No numbness, tingling, or ataxia. No seizure-type activity.  PSYCHIATRIC: No anxiety. No insomnia. No ADD.    Vitals:   Vitals:   04/12/17 2030 04/13/17 0342 04/13/17 0818 04/13/17 1232  BP: 140/63 (!) 112/55 131/81 (!) 134/36  Pulse: (!) 103 98 89 98  Resp: 18 17 16 16   Temp: 99.5 F (37.5 C) 99 F (37.2 C) 97.8 F (36.6 C) (!) 97.4 F (36.3 C)  TempSrc: Oral Oral Oral Oral  SpO2: 100% 96% 97% 99%  Weight:      Height:        Wt Readings from Last 3 Encounters:  04/11/17 219 lb 6.4 oz (99.5 kg)  01/20/17 200 lb (90.7 kg)  01/12/16 205 lb 6.4 oz (93.2 kg)     Intake/Output Summary (Last 24 hours) at 04/13/17 1420 Last data filed at  04/13/17 1300  Gross per 24 hour  Intake              600 ml  Output             2000 ml  Net            -1400 ml    Physical Exam:   GENERAL: Pleasant-appearing in no apparent distress.  HEAD, EYES, EARS, NOSE AND THROAT: Atraumatic, normocephalic. Extraocular muscles are intact. Pupils equal and reactive to light. Sclerae anicteric. No conjunctival injection. No oro-pharyngeal erythema.  NECK: Supple. There is no jugular  venous distention. No bruits, no lymphadenopathy, no thyromegaly.  HEART: Regular rate and rhythm,. No murmurs, no rubs, no clicks.  LUNGS: Clear to auscultation bilaterally. No rales or rhonchi. No wheezes.  ABDOMEN: Soft, flat, nontender, nondistended. Has good bowel sounds. No hepatosplenomegaly appreciated.  EXTREMITIES: No evidence of any cyanosis, clubbing, or peripheral edema.  +2 pedal and radial pulses bilaterally.  NEUROLOGIC: The patient is alert, awake, and oriented x3 with no focal motor or sensory deficits appreciated bilaterally.  SKIN: Moist and warm with no rashes appreciated.  Psych: Not anxious, depressed LN: No inguinal LN enlargement    Antibiotics   Anti-infectives    Start     Dose/Rate Route Frequency Ordered Stop   04/14/17 1400  cefTRIAXone (ROCEPHIN) 2 g in dextrose 5 % 50 mL IVPB     2 g 100 mL/hr over 30 Minutes Intravenous Every 24 hours 04/13/17 1238     04/13/17 1400  cefTRIAXone (ROCEPHIN) 1 g in dextrose 5 % 50 mL IVPB     1 g 100 mL/hr over 30 Minutes Intravenous NOW 04/13/17 1238 04/13/17 1419   04/13/17 1000  cefTRIAXone (ROCEPHIN) 1 g in dextrose 5 % 50 mL IVPB  Status:  Discontinued     1 g 100 mL/hr over 30 Minutes Intravenous Every 24 hours 04/13/17 0901 04/13/17 1237   04/11/17 2000  cefTRIAXone (ROCEPHIN) 2 g in dextrose 5 % 50 mL IVPB  Status:  Discontinued     2 g 100 mL/hr over 30 Minutes Intravenous Every 24 hours 04/11/17 0328 04/11/17 0755   04/11/17 1800  vancomycin (VANCOCIN) 1,250 mg in sodium chloride 0.9 %  250 mL IVPB  Status:  Discontinued     1,250 mg 166.7 mL/hr over 90 Minutes Intravenous Every 12 hours 04/11/17 1049 04/11/17 1441   04/11/17 0815  meropenem (MERREM) 1 g in sodium chloride 0.9 % 100 mL IVPB  Status:  Discontinued     1 g 200 mL/hr over 30 Minutes Intravenous Every 8 hours 04/11/17 0811 04/13/17 0901   04/11/17 0330  vancomycin (VANCOCIN) IVPB 1000 mg/200 mL premix  Status:  Discontinued     1,000 mg 200 mL/hr over 60 Minutes Intravenous Every 8 hours 04/11/17 0328 04/11/17 1049   04/10/17 1945  vancomycin (VANCOCIN) IVPB 1000 mg/200 mL premix     1,000 mg 200 mL/hr over 60 Minutes Intravenous  Once 04/10/17 1939 04/10/17 2221   04/10/17 1915  acyclovir (ZOVIRAX) 550 mg in dextrose 5 % 100 mL IVPB     550 mg 111 mL/hr over 60 Minutes Intravenous  Once 04/10/17 1901 04/10/17 2110   04/10/17 1845  cefTRIAXone (ROCEPHIN) 2 g in dextrose 5 % 50 mL IVPB     2 g 100 mL/hr over 30 Minutes Intravenous  Once 04/10/17 1843 04/10/17 2024      Medications   Scheduled Meds: . amantadine  100 mg Oral BID  . aspirin EC  325 mg Oral Daily  . baclofen  20 mg Oral TID  . collagenase   Topical Daily  . DULoxetine  30 mg Oral BID  . gabapentin  800 mg Oral QID  . heparin  5,000 Units Subcutaneous Q8H  . potassium chloride  40 mEq Oral BID  . Vitamin D (Ergocalciferol)  50,000 Units Oral Q7 days   Continuous Infusions: . [START ON 04/14/2017] cefTRIAXone (ROCEPHIN)  IV     PRN Meds:.acetaminophen, docusate sodium   Data Review:   Micro Results Recent Results (from the past 240  hour(s))  Blood Culture (routine x 2)     Status: Abnormal   Collection Time: 04/10/17  6:57 PM  Result Value Ref Range Status   Specimen Description BLOOD RIGHT THUMB  Final   Special Requests   Final    BOTTLES DRAWN AEROBIC AND ANAEROBIC Blood Culture results may not be optimal due to an inadequate volume of blood received in culture bottles   Culture  Setup Time   Final    GRAM NEGATIVE  RODS ANAEROBIC BOTTLE ONLY CRITICAL RESULT CALLED TO, READ BACK BY AND VERIFIED WITH: [name] MERRILL 04/11/17 1210 KLW GRAM POSITIVE COCCI AEROBIC BOTTLE ONLY    Culture KLEBSIELLA PNEUMONIAE (A)  Final   Report Status 04/13/2017 FINAL  Final   Organism ID, Bacteria KLEBSIELLA PNEUMONIAE  Final      Susceptibility   Klebsiella pneumoniae - MIC*    AMPICILLIN >=32 RESISTANT Resistant     CEFAZOLIN <=4 SENSITIVE Sensitive     CEFEPIME <=1 SENSITIVE Sensitive     CEFTAZIDIME <=1 SENSITIVE Sensitive     CEFTRIAXONE <=1 SENSITIVE Sensitive     CIPROFLOXACIN <=0.25 SENSITIVE Sensitive     GENTAMICIN <=1 SENSITIVE Sensitive     IMIPENEM <=0.25 SENSITIVE Sensitive     TRIMETH/SULFA >=320 RESISTANT Resistant     AMPICILLIN/SULBACTAM 8 SENSITIVE Sensitive     PIP/TAZO <=4 SENSITIVE Sensitive     Extended ESBL NEGATIVE Sensitive     * KLEBSIELLA PNEUMONIAE  Urine culture     Status: Abnormal   Collection Time: 04/10/17  6:57 PM  Result Value Ref Range Status   Specimen Description URINE, RANDOM  Final   Special Requests NONE  Final   Culture MULTIPLE SPECIES PRESENT, SUGGEST RECOLLECTION (A)  Final   Report Status 04/12/2017 FINAL  Final  CSF culture     Status: None (Preliminary result)   Collection Time: 04/10/17  6:57 PM  Result Value Ref Range Status   Specimen Description CSF  Final   Special Requests NONE  Final   Gram Stain   Final    CYTOSPIN SMEAR WBC PRESENT, PREDOMINANTLY MONONUCLEAR NO ORGANISMS SEEN    Culture   Final    NO GROWTH 3 DAYS Performed at [facility], [address] [place], [place] [address]    Report Status PENDING  Incomplete  Gram stain     Status: None   Collection Time: 04/10/17  6:57 PM  Result Value Ref Range Status   Specimen Description CSF  Final   Special Requests NONE  Final   Gram Stain RARE WBC NO ORGANISMS SEEN NO RBC SEEN   Final   Report Status 04/10/2017 FINAL  Final  Blood Culture ID Panel (Reflexed)     Status:  Abnormal   Collection Time: 04/10/17  6:57 PM  Result Value Ref Range Status   Enterococcus species NOT DETECTED NOT DETECTED Final   Listeria monocytogenes NOT DETECTED NOT DETECTED Final   Staphylococcus species NOT DETECTED NOT DETECTED Final   Staphylococcus aureus NOT DETECTED NOT DETECTED Final   Streptococcus species NOT DETECTED NOT DETECTED Final   Streptococcus agalactiae NOT DETECTED NOT DETECTED Final   Streptococcus pneumoniae NOT DETECTED NOT DETECTED Final   Streptococcus pyogenes NOT DETECTED NOT DETECTED Final   Acinetobacter baumannii NOT DETECTED NOT DETECTED Final   Enterobacteriaceae species DETECTED (A) NOT DETECTED Final    Comment: Enterobacteriaceae represent a large family of gram-negative bacteria, not a single organism. CRITICAL RESULT CALLED TO, READ BACK BY AND  VERIFIED WITH: [name] MERRILL 04/11/17 1210 KLW    Enterobacter cloacae complex NOT DETECTED NOT DETECTED Final   Escherichia coli NOT DETECTED NOT DETECTED Final   Klebsiella oxytoca NOT DETECTED NOT DETECTED Final   Klebsiella pneumoniae DETECTED (A) NOT DETECTED Final    Comment: CRITICAL RESULT CALLED TO, READ BACK BY AND VERIFIED WITH: [name] MERRILL 04/11/17 1210 KLW    Proteus species NOT DETECTED NOT DETECTED Final   Serratia marcescens NOT DETECTED NOT DETECTED Final   Carbapenem resistance NOT DETECTED NOT DETECTED Final   Haemophilus influenzae NOT DETECTED NOT DETECTED Final   Neisseria meningitidis NOT DETECTED NOT DETECTED Final   Pseudomonas aeruginosa NOT DETECTED NOT DETECTED Final   Candida albicans NOT DETECTED NOT DETECTED Final   Candida glabrata NOT DETECTED NOT DETECTED Final   Candida krusei NOT DETECTED NOT DETECTED Final   Candida parapsilosis NOT DETECTED NOT DETECTED Final   Candida tropicalis NOT DETECTED NOT DETECTED Final  Blood Culture (routine x 2)     Status: Abnormal   Collection Time: 04/10/17  7:00 PM  Result Value Ref Range Status   Specimen  Description BLOOD BLOOD RIGHT ARM  Final   Special Requests   Final    BOTTLES DRAWN AEROBIC AND ANAEROBIC Blood Culture adequate volume   Culture  Setup Time   Final    GRAM POSITIVE COCCI IN BOTH AEROBIC AND ANAEROBIC BOTTLES CRITICAL RESULT CALLED TO, READ BACK BY AND VERIFIED WITH: KAREN HAYES 04/11/17 1549 KLW    Culture (A)  Final    STAPHYLOCOCCUS SPECIES (COAGULASE NEGATIVE) THE SIGNIFICANCE OF ISOLATING THIS ORGANISM FROM A SINGLE SET OF BLOOD CULTURES WHEN MULTIPLE SETS ARE DRAWN IS UNCERTAIN. PLEASE NOTIFY THE MICROBIOLOGY DEPARTMENT WITHIN ONE WEEK IF SPECIATION AND SENSITIVITIES ARE REQUIRED. Performed at [facility], [address], [place], [place] [address]    Report Status 04/13/2017 FINAL  Final  Blood Culture ID Panel (Reflexed)     Status: Abnormal   Collection Time: 04/10/17  7:00 PM  Result Value Ref Range Status   Enterococcus species NOT DETECTED NOT DETECTED Final   Listeria monocytogenes NOT DETECTED NOT DETECTED Final   Staphylococcus species DETECTED (A) NOT DETECTED Final    Comment: Methicillin (oxacillin) resistant coagulase negative staphylococcus. Possible blood culture contaminant (unless isolated from more than one blood culture draw or clinical case suggests pathogenicity). No antibiotic treatment is indicated for blood  culture contaminants. CRITICAL RESULT CALLED TO, READ BACK BY AND VERIFIED WITH: KAREN HAYES 04/11/17 1549 KLW    Staphylococcus aureus NOT DETECTED NOT DETECTED Final   Methicillin resistance DETECTED (A) NOT DETECTED Final    Comment: CRITICAL RESULT CALLED TO, READ BACK BY AND VERIFIED WITH: KAREN HAYES 04/11/17 1550 KLW    Streptococcus species NOT DETECTED NOT DETECTED Final   Streptococcus agalactiae NOT DETECTED NOT DETECTED Final   Streptococcus pneumoniae NOT DETECTED NOT DETECTED Final   Streptococcus pyogenes NOT DETECTED NOT DETECTED Final   Acinetobacter baumannii NOT DETECTED NOT DETECTED Final    Enterobacteriaceae species NOT DETECTED NOT DETECTED Final   Enterobacter cloacae complex NOT DETECTED NOT DETECTED Final   Escherichia coli NOT DETECTED NOT DETECTED Final   Klebsiella oxytoca NOT DETECTED NOT DETECTED Final   Klebsiella pneumoniae NOT DETECTED NOT DETECTED Final   Proteus species NOT DETECTED NOT DETECTED Final   Serratia marcescens NOT DETECTED NOT DETECTED Final   Haemophilus influenzae NOT DETECTED NOT DETECTED Final   Neisseria meningitidis NOT DETECTED NOT DETECTED Final  Pseudomonas aeruginosa NOT DETECTED NOT DETECTED Final   Candida albicans NOT DETECTED NOT DETECTED Final   Candida glabrata NOT DETECTED NOT DETECTED Final   Candida krusei NOT DETECTED NOT DETECTED Final   Candida parapsilosis NOT DETECTED NOT DETECTED Final   Candida tropicalis NOT DETECTED NOT DETECTED Final    Radiology Reports Ct Head Wo Contrast  Result Date: 04/10/2017 CLINICAL DATA:  Altered mentation and fever. EXAM: CT HEAD WITHOUT CONTRAST TECHNIQUE: Contiguous axial images were obtained from the base of the skull through the vertex without intravenous contrast. COMPARISON:  MRI 04/22/2010 FINDINGS: Brain: No evidence of acute infarction, hemorrhage, hydrocephalus, extra-axial collection or mass lesion/mass effect. Vascular: No hyperdense vessel or unexpected calcification. Skull: Normal. Negative for fracture or focal lesion. Sinuses/Orbits: No acute finding. Other: None. IMPRESSION: No acute intracranial abnormality. Electronically Signed   By: Tollie Eth M.D.   On: 04/10/2017 19:49   Ct Angio Chest Pe W Or Wo Contrast  Addendum Date: 04/11/2017   ADDENDUM REPORT: 04/11/2017 12:16 ADDENDUM: Study discussed by telephone with Dr. Serita Grit  on 04/11/2017 at 1158 hours. Electronically Signed   By: Odessa Fleming M.D.   On: 04/11/2017 12:16   Result Date: 04/11/2017 CLINICAL DATA:  46 year old female with right side abdominal pain and shortness of breath. EXAM: CT ANGIOGRAPHY CHEST WITH  CONTRAST TECHNIQUE: Multidetector CT imaging of the chest was performed using the standard protocol during bolus administration of intravenous contrast. Multiplanar CT image reconstructions and MIPs were obtained to evaluate the vascular anatomy. CONTRAST:  150 mL Isovue 370; in two 75 mL boluses due to poor initial contrast timing. Furthermore, on the second set of images contrast extravasation into the proximal right upper extremity soft tissues is evident (series 12, image 8). The extravasation encompasses 40 x 23 x 40 mm (AP by transverse by CC), for an estimated volume of 18 mL. COMPARISON:  Chest radiographs 04/10/2017 and earlier. CT Abdomen and Pelvis 01/21/2017. FINDINGS: Cardiovascular: Unfortunately contrast bolus timing in the pulmonary arterial tree is suboptimal on both attempts. The second contrast bolus is mildly improved over the first. There is no central or hilar pulmonary artery filling defect. No pericardial effusion. Negative visualized aorta. Cardiac size is within normal limits. Mediastinum/Nodes: Negative.  No lymphadenopathy. Lungs/Pleura: Trace retained secretions along the right lateral aspect of the distal trachea. Otherwise the major airways are patent. There is left lower lobe lateral basal segment and right lower lobe posterior basal segment confluent pulmonary opacity which is enhancing and similar to the April body CT most compatible with atelectasis. No pleural effusion. No other abnormal pulmonary opacity. Upper Abdomen: Negative visualized liver, spleen, adrenal glands, and bowel in the upper abdomen. Mildly heterogeneous appearance of the renal cortical enhancement on the initial contrast images, and on the repeat more delayed images both kidneys demonstrate a somewhat striated nephrogram (especially the left). Renal contrast excretion appears to be symmetric. No upper abdominal free fluid. Musculoskeletal: Sclerosis and periarticular regularity at the right sternoclavicular  joint compatible with chronic arthritis. Bone mineralization elsewhere is within normal limits. No acute osseous abnormality identified. Review of the MIP images confirms the above findings. IMPRESSION: 1. Poor contrast bolus timing in the pulmonary arteries despite two attempts. No central pulmonary embolus, but follow-up V/Q scan is recommended if clinical suspicion of PE persists. 2. Heterogeneous renal enhancement suspicious for Acute Pyelonephritis. 3. Small volume IV contrast extravasation into the proximal right upper extremity. 4. Chronic pulmonary lower lobe atelectasis. Electronically Signed: By: Odessa Fleming M.D. On:  04/11/2017 11:48   Dg Chest Port 1 View  Result Date: 04/10/2017 CLINICAL DATA:  Altered mental status and fever. EXAM: PORTABLE CHEST 1 VIEW COMPARISON:  01/20/2017 FINDINGS: 1940 hours. Limited study due to low volumes and kyphotic positioning. Within this limitation, the cardiopericardial silhouette appears enlarged. There is pulmonary vascular congestion. Right parahilar opacity has relatively well-defined margins. Probable basilar atelectasis bilaterally. The visualized bony structures of the thorax are intact. Telemetry leads overlie the chest. IMPRESSION: Right parahilar atelectasis or pneumonia. Given the low volume film, mass lesion cannot be excluded. Recommend dedicated PA and lateral views of the chest when the patient is able to cooperate with upright positioning. Electronically Signed   By: Kennith Center M.D.   On: 04/10/2017 19:57     CBC  Recent Labs Lab 04/10/17 1857 04/11/17 0352 04/12/17 0341 04/13/17 0619  WBC 30.5* 28.8* 13.2* 9.7  HGB 13.4 12.2 11.3* 10.2*  HCT 40.4 37.1 34.0* 30.6*  PLT 316 293 238 274  MCV 86.0 86.6 87.3 85.4  MCH 28.6 28.5 29.0 28.6  MCHC 33.3 32.9 33.2 33.5  RDW 15.4* 15.7* 15.3* 15.5*  LYMPHSABS 1.2  --   --   --   MONOABS 3.4*  --   --   --   EOSABS 0.0  --   --   --   BASOSABS 0.0  --   --   --     Chemistries   Recent  Labs Lab 04/10/17 1857 04/11/17 0352 04/12/17 0341 04/13/17 0619  NA 133* 139 140 141  K 3.9 3.8 2.9* 4.2  CL 99* 106 108 112*  CO2 22 22 25 26   GLUCOSE 117* 117* 108* 101*  BUN 18 14 7 8   CREATININE 0.90 0.84 0.88 0.78  CALCIUM 9.4 8.7* 8.5* 8.7*  MG  --   --  2.2 2.1  AST 23  --   --   --   ALT 16  --   --   --   ALKPHOS 67  --   --   --   BILITOT 1.7*  --   --   --    ------------------------------------------------------------------------------------------------------------------ estimated creatinine clearance is 99.6 mL/min (by C-G formula based on SCr of 0.78 mg/dL). ------------------------------------------------------------------------------------------------------------------ No results for input(s): HGBA1C in the last 72 hours. ------------------------------------------------------------------------------------------------------------------ No results for input(s): CHOL, HDL, LDLCALC, TRIG, CHOLHDL, LDLDIRECT in the last 72 hours. ------------------------------------------------------------------------------------------------------------------ No results for input(s): TSH, T4TOTAL, T3FREE, THYROIDAB in the last 72 hours.  Invalid input(s): FREET3 ------------------------------------------------------------------------------------------------------------------ No results for input(s): VITAMINB12, FOLATE, FERRITIN, TIBC, IRON, RETICCTPCT in the last 72 hours.  Coagulation profile  Recent Labs Lab 04/11/17 0352  INR 1.36    No results for input(s): DDIMER in the last 72 hours.  Cardiac Enzymes  Recent Labs Lab 04/11/17 0352 04/11/17 0709 04/11/17 1518  TROPONINI 0.04* 0.11* <0.03   ------------------------------------------------------------------------------------------------------------------ Invalid input(s): POCBNP    Assessment & Plan   * Sepsis due to Klebsiella pneumonia  Due to UTI   urine culture showed multi-growth    Klebsiella was  sensitive to cephalosporins will treat with ceftriaxone  * MS and paralysis   Bed bound.   At baseline    Cont home meds.  *Questionable pneumonia on chest x-ray CT scan does not show pneumonia  *Tachycardia bedbound tachycardia now resolved CT scan shows no major PE  *Elevated heart rate due to demand ischemia, check echocardiogram showing wall motion abnormality patient reports intermittent chest pain now order a stress MIBI for tomorrow  *  Hypokalemia replaced      Code Status Orders        Start     Ordered   04/11/17 0110  Full code  Continuous     04/11/17 0109    Code Status History    Date Active Date Inactive Code Status Order ID Comments User Context   01/20/2017  6:03 AM 01/24/2017  6:43 PM Full Code [id number]  Ihor Austin, MD ED   01/12/2016  4:48 PM 01/15/2016  6:24 PM Full Code [id number]  Houston Siren, MD ED   08/09/2015  5:24 PM 08/12/2015  9:35 PM Full Code [id number]  Marguarite Arbour, MD Inpatient   07/19/2015  2:54 PM 07/25/2015  1:51 PM Full Code [id number]  Gladis Riffle, MD Inpatient           Consults   none   DVT Prophylaxis  Lovenox   Lab Results  Component Value Date   PLT 274 04/13/2017     Time Spent in minutes    Greater than 50% of time spent in care coordination and counseling patient regarding the condition and plan of care.   Auburn Bilberry M.D on 04/13/2017 at 2:20 PM  Between 7am to 6pm - Pager - [phone]  After 6pm go to [email or web address] - password [email or web address] [facility]  [facility]   Office  [phone]

## 2017-04-13 NOTE — Progress Notes (Signed)
Urostomy bag came off twice today. New bag applied and connected to a standard drainage bag so that the urostomy wouldn't get too full and come off.

## 2017-04-14 ENCOUNTER — Inpatient Hospital Stay: Payer: Medicare Other

## 2017-04-14 LAB — LIPID PANEL
Cholesterol: 155 mg/dL (ref 0–200)
HDL: 29 mg/dL — AB (ref >40)
LDL CALC: 90 mg/dL (ref 0–99)
Total CHOL/HDL Ratio: 5.3 RATIO
Triglycerides: 178 mg/dL — ABNORMAL HIGH (ref <150)
VLDL: 36 mg/dL (ref 0–40)

## 2017-04-14 LAB — CULTURE, BLOOD (ROUTINE X 2)

## 2017-04-14 LAB — NM MYOCAR MULTI W/SPECT W/WALL MOTION / EF
CHL CUP NUCLEAR SSS: 7
CHL RATE OF PERCEIVED EXERTION: 66
CSEPEDS: 0 s
CSEPEW: 1 METS
Exercise duration (min): 1 min
LVDIAVOL: 86 mL (ref 46–106)
LVSYSVOL: 23 mL
MPHR: 115 {beats}/min
NUC STRESS TID: 1.18
Peak HR: 115 {beats}/min
Percent HR: 173 %
Rest HR: 83 {beats}/min
SDS: 1
SRS: 8

## 2017-04-14 LAB — CSF CULTURE W GRAM STAIN: Culture: NO GROWTH

## 2017-04-14 LAB — CSF CULTURE

## 2017-04-14 MED ORDER — ASPIRIN 325 MG PO TBEC: 325.0000 mg | DELAYED_RELEASE_TABLET | Freq: Every day | ORAL | 0 refills | Status: DC

## 2017-04-14 MED ORDER — TECHNETIUM TC 99M TETROFOSMIN IV KIT
33.4000 | PACK | Freq: Once | INTRAVENOUS | Status: AC | PRN
  Administered 2017-04-14: 33.4 via INTRAVENOUS

## 2017-04-14 MED ORDER — CEFUROXIME AXETIL 500 MG PO TABS: 500.0000 mg | ORAL_TABLET | Freq: Two times a day (BID) | ORAL | 0 refills | Status: AC

## 2017-04-14 MED ORDER — REGADENOSON 0.4 MG/5ML IV SOLN
0.4000 mg | Freq: Once | INTRAVENOUS | Status: AC
  Administered 2017-04-14: 0.4 mg via INTRAVENOUS

## 2017-04-14 MED ORDER — TECHNETIUM TC 99M TETROFOSMIN IV KIT
12.4400 | PACK | Freq: Once | INTRAVENOUS | Status: AC | PRN
  Administered 2017-04-14: 12.44 via INTRAVENOUS

## 2017-04-14 NOTE — Care Management Important Message (Signed)
Important Message  Patient Details  Name: [name] MRN: [id number] Date of Birth: [birth year]   Medicare Important Message Given:  Yes Signed IM notice given    Eber Hong, RN 04/14/2017, 8:24 AM

## 2017-04-14 NOTE — Progress Notes (Signed)
Patient is discharge home in a stable condition, summary and f/u care given , verbalized understanding , awaiting pick up by  EMS

## 2017-04-14 NOTE — Discharge Summary (Signed)
[facility], 46 y.o., DOB 23-Jun-1971, MRN [id number]. Admission date: 04/10/2017 Discharge Date 04/14/2017 Primary MD Gladis Riffle, MD Admitting Physician Altamese Dilling, MD  Admission Diagnosis  Encephalopathy [G93.40] Sepsis, due to unspecified organism (HCC) [A41.9] Sepsis [facility]) [A41.9]  Discharge Diagnosis   Principal Problem:  Acute encephalopathy due to sepsis Klebsiella pneumonia sepsis Abnormal echo with wall motion abnormality with some chest pain status post stress test with negative stress test Multiple sclerosis Bedbound state History of abdominal wall hernia Neurogenic bladder with urostomy tubes in place Elevated troponin due to demand ischemia      Hospital Course  Koya Hunger  is a 46 y.o. female with a known history of MS, neurogenic bladder, bed bound- urostomy tubes, recurrent UTI. For last 2 days have some confusion, and had low grade fever today- so family called EMS. Patient in the emergency room was noted to have a urinary tract infection. She was started on IV antibiotics. Subsequent blood cultures came back positive for Klebsiella pneumonia. Her urine however showed multi-bacterial however the source was likely urinary. Her chest x-ray did show possible pneumonia however CT scan of the chest with IV contrast which was limited did not show pneumonia. She also had echocardiogram due to elevated troponin which showed some wall motion abnormality and concern for ischemia and therefore she underwent a stress mibi which was negative. Patient currently is back to baseline            Consults  None  Significant Tests:  See full reports for all details     Ct Head Wo Contrast  Result Date: 04/10/2017 CLINICAL DATA:  Altered mentation and fever. EXAM: CT HEAD WITHOUT CONTRAST TECHNIQUE: Contiguous axial images were obtained from the base of the skull through the vertex without intravenous  contrast. COMPARISON:  MRI 04/22/2010 FINDINGS: Brain: No evidence of acute infarction, hemorrhage, hydrocephalus, extra-axial collection or mass lesion/mass effect. Vascular: No hyperdense vessel or unexpected calcification. Skull: Normal. Negative for fracture or focal lesion. Sinuses/Orbits: No acute finding. Other: None. IMPRESSION: No acute intracranial abnormality. Electronically Signed   By: Tollie Eth M.D.   On: 04/10/2017 19:49   Ct Angio Chest Pe W Or Wo Contrast  Addendum Date: 04/11/2017   ADDENDUM REPORT: 04/11/2017 12:16 ADDENDUM: Study discussed by telephone with Dr. Serita Grit  on 04/11/2017 at 1158 hours. Electronically Signed   By: Odessa Fleming M.D.   On: 04/11/2017 12:16   Result Date: 04/11/2017 CLINICAL DATA:  46 year old female with right side abdominal pain and shortness of breath. EXAM: CT ANGIOGRAPHY CHEST WITH CONTRAST TECHNIQUE: Multidetector CT imaging of the chest was performed using the standard protocol during bolus administration of intravenous contrast. Multiplanar CT image reconstructions and MIPs were obtained to evaluate the vascular anatomy. CONTRAST:  150 mL Isovue 370; in two 75 mL boluses due to poor initial contrast timing. Furthermore, on the second set of images contrast extravasation into the proximal right upper extremity soft tissues is evident (series 12, image 8). The extravasation encompasses 40 x 23 x 40 mm (AP by transverse by CC), for an estimated volume of 18 mL. COMPARISON:  Chest radiographs 04/10/2017 and earlier. CT Abdomen and Pelvis 01/21/2017. FINDINGS: Cardiovascular: Unfortunately contrast bolus timing in the pulmonary arterial tree is suboptimal on both attempts. The second contrast bolus is mildly improved over the first. There is no central or hilar pulmonary artery filling defect. No pericardial effusion. Negative visualized aorta. Cardiac size is within  normal limits. Mediastinum/Nodes: Negative.  No lymphadenopathy. Lungs/Pleura: Trace retained  secretions along the right lateral aspect of the distal trachea. Otherwise the major airways are patent. There is left lower lobe lateral basal segment and right lower lobe posterior basal segment confluent pulmonary opacity which is enhancing and similar to the April body CT most compatible with atelectasis. No pleural effusion. No other abnormal pulmonary opacity. Upper Abdomen: Negative visualized liver, spleen, adrenal glands, and bowel in the upper abdomen. Mildly heterogeneous appearance of the renal cortical enhancement on the initial contrast images, and on the repeat more delayed images both kidneys demonstrate a somewhat striated nephrogram (especially the left). Renal contrast excretion appears to be symmetric. No upper abdominal free fluid. Musculoskeletal: Sclerosis and periarticular regularity at the right sternoclavicular joint compatible with chronic arthritis. Bone mineralization elsewhere is within normal limits. No acute osseous abnormality identified. Review of the MIP images confirms the above findings. IMPRESSION: 1. Poor contrast bolus timing in the pulmonary arteries despite two attempts. No central pulmonary embolus, but follow-up V/Q scan is recommended if clinical suspicion of PE persists. 2. Heterogeneous renal enhancement suspicious for Acute Pyelonephritis. 3. Small volume IV contrast extravasation into the proximal right upper extremity. 4. Chronic pulmonary lower lobe atelectasis. Electronically Signed: By: Odessa Fleming M.D. On: 04/11/2017 11:48   Nm Myocar Multi W/spect W/wall Motion / Ef  Result Date: 04/14/2017  The left ventricular ejection fraction is normal (55-65%).  The study is normal.  This is a low risk study.    Dg Chest Port 1 View  Result Date: 04/10/2017 CLINICAL DATA:  Altered mental status and fever. EXAM: PORTABLE CHEST 1 VIEW COMPARISON:  01/20/2017 FINDINGS: 1940 hours. Limited study due to low volumes and kyphotic positioning. Within this limitation, the  cardiopericardial silhouette appears enlarged. There is pulmonary vascular congestion. Right parahilar opacity has relatively well-defined margins. Probable basilar atelectasis bilaterally. The visualized bony structures of the thorax are intact. Telemetry leads overlie the chest. IMPRESSION: Right parahilar atelectasis or pneumonia. Given the low volume film, mass lesion cannot be excluded. Recommend dedicated PA and lateral views of the chest when the patient is able to cooperate with upright positioning. Electronically Signed   By: Kennith Center M.D.   On: 04/10/2017 19:57       Today   Subjective:   [name]  patient doing well denies any chest pain  Objective:   Blood pressure 119/65, pulse 91, temperature 97.8 F (36.6 C), temperature source Oral, resp. rate 18, height 5\' 4"  (1.626 m), weight 219 lb 6.4 oz (99.5 kg), SpO2 95 %.  .  Intake/Output Summary (Last 24 hours) at 04/14/17 1443 Last data filed at 04/14/17 1334  Gross per 24 hour  Intake              120 ml  Output             3150 ml  Net            -3030 ml    Exam VITAL SIGNS: Blood pressure 119/65, pulse 91, temperature 97.8 F (36.6 C), temperature source Oral, resp. rate 18, height 5\' 4"  (1.626 m), weight 219 lb 6.4 oz (99.5 kg), SpO2 95 %.  GENERAL:  46 y.o.-year-old patient lying in the bed with no acute distress.  EYES: Pupils equal, round, reactive to light and accommodation. No scleral icterus. Extraocular muscles intact.  HEENT: Head atraumatic, normocephalic. Oropharynx and nasopharynx clear.  NECK:  Supple, no jugular venous distention. No thyroid enlargement,  no tenderness.  LUNGS: Normal breath sounds bilaterally, no wheezing, rales,rhonchi or crepitation. No use of accessory muscles of respiration.  CARDIOVASCULAR: S1, S2 normal. No murmurs, rubs, or gallops.  ABDOMEN: Soft, nontender, nondistended. Bowel sounds present. No organomegaly or mass.  EXTREMITIES: No pedal edema, cyanosis, or clubbing.   NEUROLOGIC: Cranial nerves II through XII are intact. Muscle strength 5/5 in all extremities. Sensation intact. Gait not checked.  PSYCHIATRIC: The patient is alert and oriented x 3.  SKIN: No obvious rash, lesion, or ulcer.   Data Review     CBC w Diff: Lab Results  Component Value Date   WBC 9.7 04/13/2017   HGB 10.2 (L) 04/13/2017   HGB 12.6 10/05/2014   HCT 30.6 (L) 04/13/2017   HCT 37.7 10/05/2014   PLT 274 04/13/2017   PLT 272 10/05/2014   LYMPHOPCT 4 04/10/2017   LYMPHOPCT 33.2 09/18/2012   BANDSPCT 0 01/20/2017   MONOPCT 11 04/10/2017   MONOPCT 11.4 09/18/2012   EOSPCT 0 04/10/2017   EOSPCT 1.6 09/18/2012   BASOPCT 0 04/10/2017   BASOPCT 0.5 09/18/2012   CMP: Lab Results  Component Value Date   NA 141 04/13/2017   NA 138 10/05/2014   K 4.2 04/13/2017   K 3.6 10/05/2014   CL 112 (H) 04/13/2017   CL 101 10/05/2014   CO2 26 04/13/2017   CO2 31 10/05/2014   BUN 8 04/13/2017   BUN 10 10/05/2014   CREATININE 0.78 04/13/2017   CREATININE 0.73 10/05/2014   PROT 9.3 (H) 04/10/2017   PROT 7.5 10/05/2014   ALBUMIN 3.6 04/10/2017   ALBUMIN 3.3 (L) 10/05/2014   BILITOT 1.7 (H) 04/10/2017   BILITOT 0.7 10/05/2014   ALKPHOS 67 04/10/2017   ALKPHOS 54 10/05/2014   AST 23 04/10/2017   AST 18 10/05/2014   ALT 16 04/10/2017   ALT 13 (L) 10/05/2014  .  Micro Results Recent Results (from the past 240 hour(s))  Blood Culture (routine x 2)     Status: Abnormal   Collection Time: 04/10/17  6:57 PM  Result Value Ref Range Status   Specimen Description BLOOD RIGHT THUMB  Final   Special Requests   Final    BOTTLES DRAWN AEROBIC AND ANAEROBIC Blood Culture results may not be optimal due to an inadequate volume of blood received in culture bottles   Culture  Setup Time   Final    GRAM NEGATIVE RODS ANAEROBIC BOTTLE ONLY CRITICAL RESULT CALLED TO, READ BACK BY AND VERIFIED WITH: [name] MERRILL 04/11/17 1210 KLW GRAM POSITIVE COCCI AEROBIC BOTTLE ONLY    Culture  (A)  Final    KLEBSIELLA PNEUMONIAE MICROCOCCUS SPECIES CORRECTED ON 07/20 AT 0948: PREVIOUSLY REPORTED AS GRAM POSITIVE COCCI Standardized susceptibility testing for this organism is not available. Performed at [facility], [address] [place], [place] [address]    Report Status 04/14/2017 FINAL  Final   Organism ID, Bacteria KLEBSIELLA PNEUMONIAE  Final      Susceptibility   Klebsiella pneumoniae - MIC*    AMPICILLIN >=32 RESISTANT Resistant     CEFAZOLIN <=4 SENSITIVE Sensitive     CEFEPIME <=1 SENSITIVE Sensitive     CEFTAZIDIME <=1 SENSITIVE Sensitive     CEFTRIAXONE <=1 SENSITIVE Sensitive     CIPROFLOXACIN <=0.25 SENSITIVE Sensitive     GENTAMICIN <=1 SENSITIVE Sensitive     IMIPENEM <=0.25 SENSITIVE Sensitive     TRIMETH/SULFA >=320 RESISTANT Resistant     AMPICILLIN/SULBACTAM 8 SENSITIVE Sensitive  PIP/TAZO <=4 SENSITIVE Sensitive     Extended ESBL NEGATIVE Sensitive     * KLEBSIELLA PNEUMONIAE  Urine culture     Status: Abnormal   Collection Time: 04/10/17  6:57 PM  Result Value Ref Range Status   Specimen Description URINE, RANDOM  Final   Special Requests NONE  Final   Culture MULTIPLE SPECIES PRESENT, SUGGEST RECOLLECTION (A)  Final   Report Status 04/12/2017 FINAL  Final  CSF culture     Status: None   Collection Time: 04/10/17  6:57 PM  Result Value Ref Range Status   Specimen Description CSF  Final   Special Requests NONE  Final   Gram Stain   Final    CYTOSPIN SMEAR WBC PRESENT, PREDOMINANTLY MONONUCLEAR NO ORGANISMS SEEN    Culture   Final    NO GROWTH 3 DAYS Performed at [facility], [address] [place], [place] [address]    Report Status 04/14/2017 FINAL  Final  Gram stain     Status: None   Collection Time: 04/10/17  6:57 PM  Result Value Ref Range Status   Specimen Description CSF  Final   Special Requests NONE  Final   Gram Stain RARE WBC NO ORGANISMS SEEN NO RBC SEEN   Final   Report Status 04/10/2017 FINAL  Final   Blood Culture ID Panel (Reflexed)     Status: Abnormal   Collection Time: 04/10/17  6:57 PM  Result Value Ref Range Status   Enterococcus species NOT DETECTED NOT DETECTED Final   Listeria monocytogenes NOT DETECTED NOT DETECTED Final   Staphylococcus species NOT DETECTED NOT DETECTED Final   Staphylococcus aureus NOT DETECTED NOT DETECTED Final   Streptococcus species NOT DETECTED NOT DETECTED Final   Streptococcus agalactiae NOT DETECTED NOT DETECTED Final   Streptococcus pneumoniae NOT DETECTED NOT DETECTED Final   Streptococcus pyogenes NOT DETECTED NOT DETECTED Final   Acinetobacter baumannii NOT DETECTED NOT DETECTED Final   Enterobacteriaceae species DETECTED (A) NOT DETECTED Final    Comment: Enterobacteriaceae represent a large family of gram-negative bacteria, not a single organism. CRITICAL RESULT CALLED TO, READ BACK BY AND VERIFIED WITH: [name] MERRILL 04/11/17 1210 KLW    Enterobacter cloacae complex NOT DETECTED NOT DETECTED Final   Escherichia coli NOT DETECTED NOT DETECTED Final   Klebsiella oxytoca NOT DETECTED NOT DETECTED Final   Klebsiella pneumoniae DETECTED (A) NOT DETECTED Final    Comment: CRITICAL RESULT CALLED TO, READ BACK BY AND VERIFIED WITH: [name] MERRILL 04/11/17 1210 KLW    Proteus species NOT DETECTED NOT DETECTED Final   Serratia marcescens NOT DETECTED NOT DETECTED Final   Carbapenem resistance NOT DETECTED NOT DETECTED Final   Haemophilus influenzae NOT DETECTED NOT DETECTED Final   Neisseria meningitidis NOT DETECTED NOT DETECTED Final   Pseudomonas aeruginosa NOT DETECTED NOT DETECTED Final   Candida albicans NOT DETECTED NOT DETECTED Final   Candida glabrata NOT DETECTED NOT DETECTED Final   Candida krusei NOT DETECTED NOT DETECTED Final   Candida parapsilosis NOT DETECTED NOT DETECTED Final   Candida tropicalis NOT DETECTED NOT DETECTED Final  Blood Culture (routine x 2)     Status: Abnormal   Collection Time: 04/10/17  7:00 PM   Result Value Ref Range Status   Specimen Description BLOOD BLOOD RIGHT ARM  Final   Special Requests   Final    BOTTLES DRAWN AEROBIC AND ANAEROBIC Blood Culture adequate volume   Culture  Setup Time   Final  GRAM POSITIVE COCCI IN BOTH AEROBIC AND ANAEROBIC BOTTLES CRITICAL RESULT CALLED TO, READ BACK BY AND VERIFIED WITH: KAREN HAYES 04/11/17 1549 KLW    Culture (A)  Final    STAPHYLOCOCCUS SPECIES (COAGULASE NEGATIVE) THE SIGNIFICANCE OF ISOLATING THIS ORGANISM FROM A SINGLE SET OF BLOOD CULTURES WHEN MULTIPLE SETS ARE DRAWN IS UNCERTAIN. PLEASE NOTIFY THE MICROBIOLOGY DEPARTMENT WITHIN ONE WEEK IF SPECIATION AND SENSITIVITIES ARE REQUIRED. Performed at [facility], [address] [place], [place] [address]    Report Status 04/13/2017 FINAL  Final  Blood Culture ID Panel (Reflexed)     Status: Abnormal   Collection Time: 04/10/17  7:00 PM  Result Value Ref Range Status   Enterococcus species NOT DETECTED NOT DETECTED Final   Listeria monocytogenes NOT DETECTED NOT DETECTED Final   Staphylococcus species DETECTED (A) NOT DETECTED Final    Comment: Methicillin (oxacillin) resistant coagulase negative staphylococcus. Possible blood culture contaminant (unless isolated from more than one blood culture draw or clinical case suggests pathogenicity). No antibiotic treatment is indicated for blood  culture contaminants. CRITICAL RESULT CALLED TO, READ BACK BY AND VERIFIED WITH: KAREN HAYES 04/11/17 1549 KLW    Staphylococcus aureus NOT DETECTED NOT DETECTED Final   Methicillin resistance DETECTED (A) NOT DETECTED Final    Comment: CRITICAL RESULT CALLED TO, READ BACK BY AND VERIFIED WITH: KAREN HAYES 04/11/17 1550 KLW    Streptococcus species NOT DETECTED NOT DETECTED Final   Streptococcus agalactiae NOT DETECTED NOT DETECTED Final   Streptococcus pneumoniae NOT DETECTED NOT DETECTED Final   Streptococcus pyogenes NOT DETECTED NOT DETECTED Final   Acinetobacter baumannii NOT  DETECTED NOT DETECTED Final   Enterobacteriaceae species NOT DETECTED NOT DETECTED Final   Enterobacter cloacae complex NOT DETECTED NOT DETECTED Final   Escherichia coli NOT DETECTED NOT DETECTED Final   Klebsiella oxytoca NOT DETECTED NOT DETECTED Final   Klebsiella pneumoniae NOT DETECTED NOT DETECTED Final   Proteus species NOT DETECTED NOT DETECTED Final   Serratia marcescens NOT DETECTED NOT DETECTED Final   Haemophilus influenzae NOT DETECTED NOT DETECTED Final   Neisseria meningitidis NOT DETECTED NOT DETECTED Final   Pseudomonas aeruginosa NOT DETECTED NOT DETECTED Final   Candida albicans NOT DETECTED NOT DETECTED Final   Candida glabrata NOT DETECTED NOT DETECTED Final   Candida krusei NOT DETECTED NOT DETECTED Final   Candida parapsilosis NOT DETECTED NOT DETECTED Final   Candida tropicalis NOT DETECTED NOT DETECTED Final  CULTURE, BLOOD (ROUTINE X 2) w Reflex to ID Panel     Status: None (Preliminary result)   Collection Time: 04/13/17 10:37 AM  Result Value Ref Range Status   Specimen Description BLOOD LEFT FINGER  Final   Special Requests   Final    BOTTLES DRAWN AEROBIC AND ANAEROBIC Blood Culture results may not be optimal due to an inadequate volume of blood received in culture bottles   Culture NO GROWTH < 24 HOURS  Final   Report Status PENDING  Incomplete  CULTURE, BLOOD (ROUTINE X 2) w Reflex to ID Panel     Status: None (Preliminary result)   Collection Time: 04/13/17 10:56 AM  Result Value Ref Range Status   Specimen Description BLOOD BLOOD RIGHT HAND  Final   Special Requests   Final    BOTTLES DRAWN AEROBIC AND ANAEROBIC Blood Culture adequate volume   Culture NO GROWTH < 24 HOURS  Final   Report Status PENDING  Incomplete        Code Status Orders  Start     Ordered   04/11/17 0110  Full code  Continuous     04/11/17 0109    Code Status History    Date Active Date Inactive Code Status Order ID Comments User Context   01/20/2017  6:03 AM  01/24/2017  6:43 PM Full Code [id number]  Ihor Austin, MD ED   01/12/2016  4:48 PM 01/15/2016  6:24 PM Full Code [id number]  Houston Siren, MD ED   08/09/2015  5:24 PM 08/12/2015  9:35 PM Full Code [id number]  Marguarite Arbour, MD Inpatient   07/19/2015  2:54 PM 07/25/2015  1:51 PM Full Code [id number]  Gladis Riffle, MD Inpatient          Follow-up Information    Gladis Riffle, MD In 1 week.   Specialty:  Internal Medicine Why:  Please call the office to schedule this appointment. Thanks          Discharge Medications   Allergies as of 04/14/2017      Reactions   Latex Swelling, Other (See Comments)   Reaction:  Facial swelling       Medication List    TAKE these medications   acetaminophen 500 MG tablet Commonly known as:  TYLENOL Take 500 mg by mouth every 6 (six) hours as needed.   amantadine 100 MG capsule Commonly known as:  SYMMETREL Take 100 mg by mouth 2 (two) times daily.   aspirin 325 MG EC tablet Take 1 tablet (325 mg total) by mouth daily.   baclofen 20 MG tablet Commonly known as:  LIORESAL Take 20 mg by mouth 3 (three) times daily.   cefUROXime 500 MG tablet Commonly known as:  CEFTIN Take 1 tablet (500 mg total) by mouth 2 (two) times daily.   cholecalciferol 1000 units tablet Commonly known as:  VITAMIN D Take 1,000 Units by mouth daily.   DULoxetine 30 MG capsule Commonly known as:  CYMBALTA Take 30 mg by mouth 2 (two) times daily.   gabapentin 400 MG capsule Commonly known as:  NEURONTIN Take 2 capsules (800 mg total) by mouth 4 (four) times daily.   polyethylene glycol packet Commonly known as:  MIRALAX / GLYCOLAX Take 17 g by mouth daily.          Total Time in preparing paper work, data evaluation and todays exam - 35 minutes  Auburn Bilberry M.D on 04/14/2017 at 2:43 PM  [facility]   Office  [phone]

## 2017-04-14 NOTE — Care Management (Signed)
Anticipated discharge home today with ems transport if stress test is negative.  Patient verbalizes understanding that someone must be in the home to receive.  She declines need for any additional in home services. Primary nurse updated that non emergent transport form has been completed and no other CM needs.

## 2017-04-18 LAB — CULTURE, BLOOD (ROUTINE X 2)
CULTURE: NO GROWTH
Culture: NO GROWTH
Special Requests: ADEQUATE

## 2017-04-28 MED ORDER — AMANTADINE HCL 100 MG CAPSULE
ORAL_CAPSULE | 2 refills | 0 days | Status: CP
Start: 2017-04-28 — End: 2017-07-21

## 2017-06-23 MED ORDER — BACLOFEN 20 MG TABLET
ORAL_TABLET | 3 refills | 0 days | Status: CP
Start: 2017-06-23 — End: 2017-10-16

## 2017-07-11 ENCOUNTER — Ambulatory Visit
Admission: RE | Admit: 2017-07-11 | Discharge: 2017-07-11 | Disposition: A | Discharge status: Home / Self Care | Payer: MEDICARE

## 2017-07-11 DIAGNOSIS — I878 Other specified disorders of veins: Secondary | ICD-10-CM

## 2017-07-11 DIAGNOSIS — N3000 Acute cystitis without hematuria: Secondary | ICD-10-CM

## 2017-07-11 DIAGNOSIS — L821 Other seborrheic keratosis: Principal | ICD-10-CM

## 2017-07-11 MED ORDER — GLYCERIN LOTION
0 refills | 0 days | Status: CP
Start: 2017-07-11 — End: ?

## 2017-07-11 MED ORDER — KETOCONAZOLE 2 % TOPICAL CREAM
Freq: Every day | TOPICAL | 0 refills | 0.00000 days | Status: CP
Start: 2017-07-11 — End: 2018-07-11

## 2017-07-24 MED ORDER — DULOXETINE 30 MG CAPSULE,DELAYED RELEASE
ORAL_CAPSULE | 5 refills | 0 days | Status: CP
Start: 2017-07-24 — End: 2018-02-01

## 2017-07-26 MED ORDER — AMANTADINE HCL 100 MG CAPSULE
ORAL_CAPSULE | 3 refills | 0 days | Status: CP
Start: 2017-07-26 — End: 2017-12-07

## 2017-10-12 ENCOUNTER — Encounter
Admit: 2017-10-12 | Discharge: 2017-10-13 | Payer: MEDICARE | Attending: Pharmacist Clinician (PhC)/ Clinical Pharmacy Specialist | Primary: Pharmacist Clinician (PhC)/ Clinical Pharmacy Specialist

## 2017-10-12 DIAGNOSIS — Z0000 Encounter for general adult medical examination without abnormal findings: Principal | ICD-10-CM

## 2017-10-17 MED ORDER — BACLOFEN 20 MG TABLET
ORAL_TABLET | 0 refills | 0 days | Status: CP
Start: 2017-10-17 — End: 2017-11-09

## 2017-11-10 MED ORDER — BACLOFEN 20 MG TABLET
ORAL_TABLET | 3 refills | 0 days | Status: CP
Start: 2017-11-10 — End: 2018-08-10

## 2017-11-16 IMAGING — CR DG CHEST 2V
1 series · 2 of 2 positions shown · non-contrast
Comparison: None.

CLINICAL DATA: 46-year-old female with weakness. Chest radiograph
dated 07/19/2015

EXAM:
CHEST  2 VIEW

[Series 1: w chest lat · 0.14mm/px · 2 of 2 slices shown]
[im 1/2]
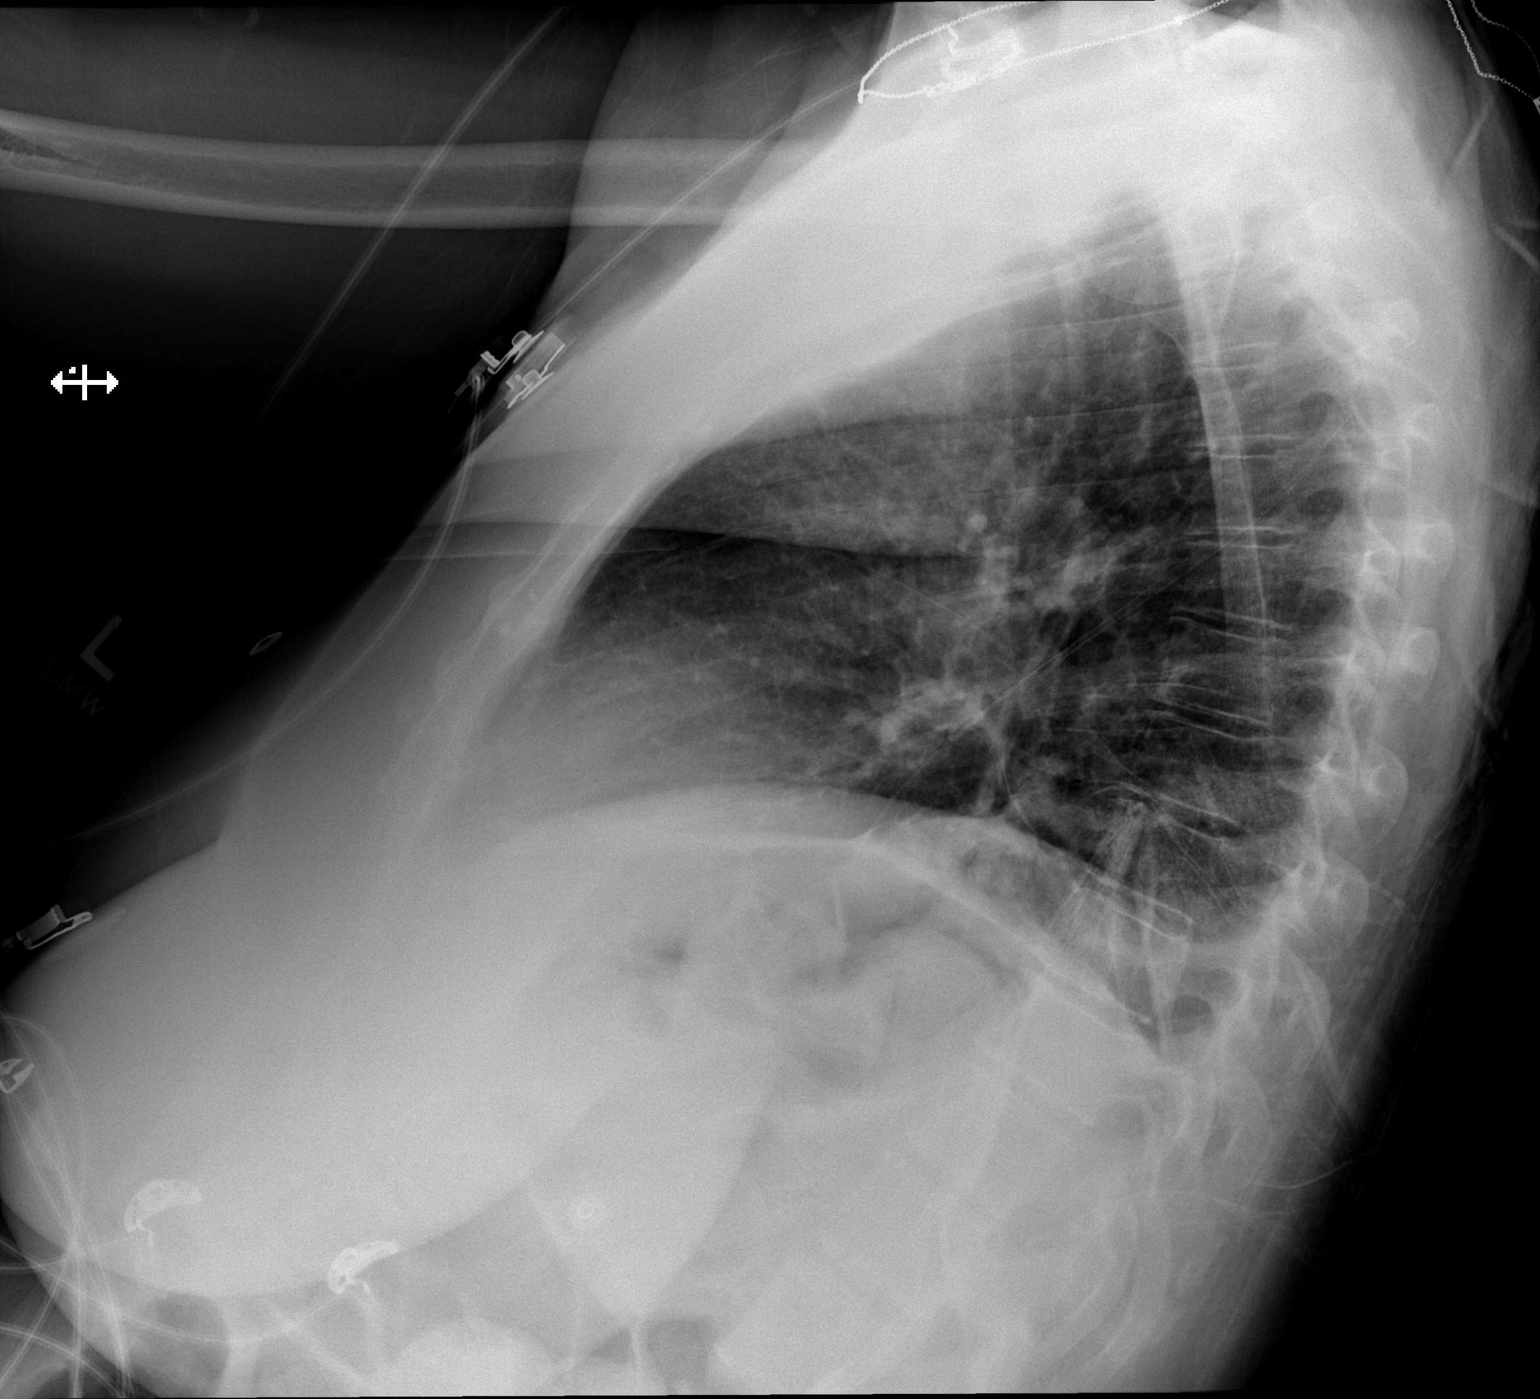
[im 2/2]
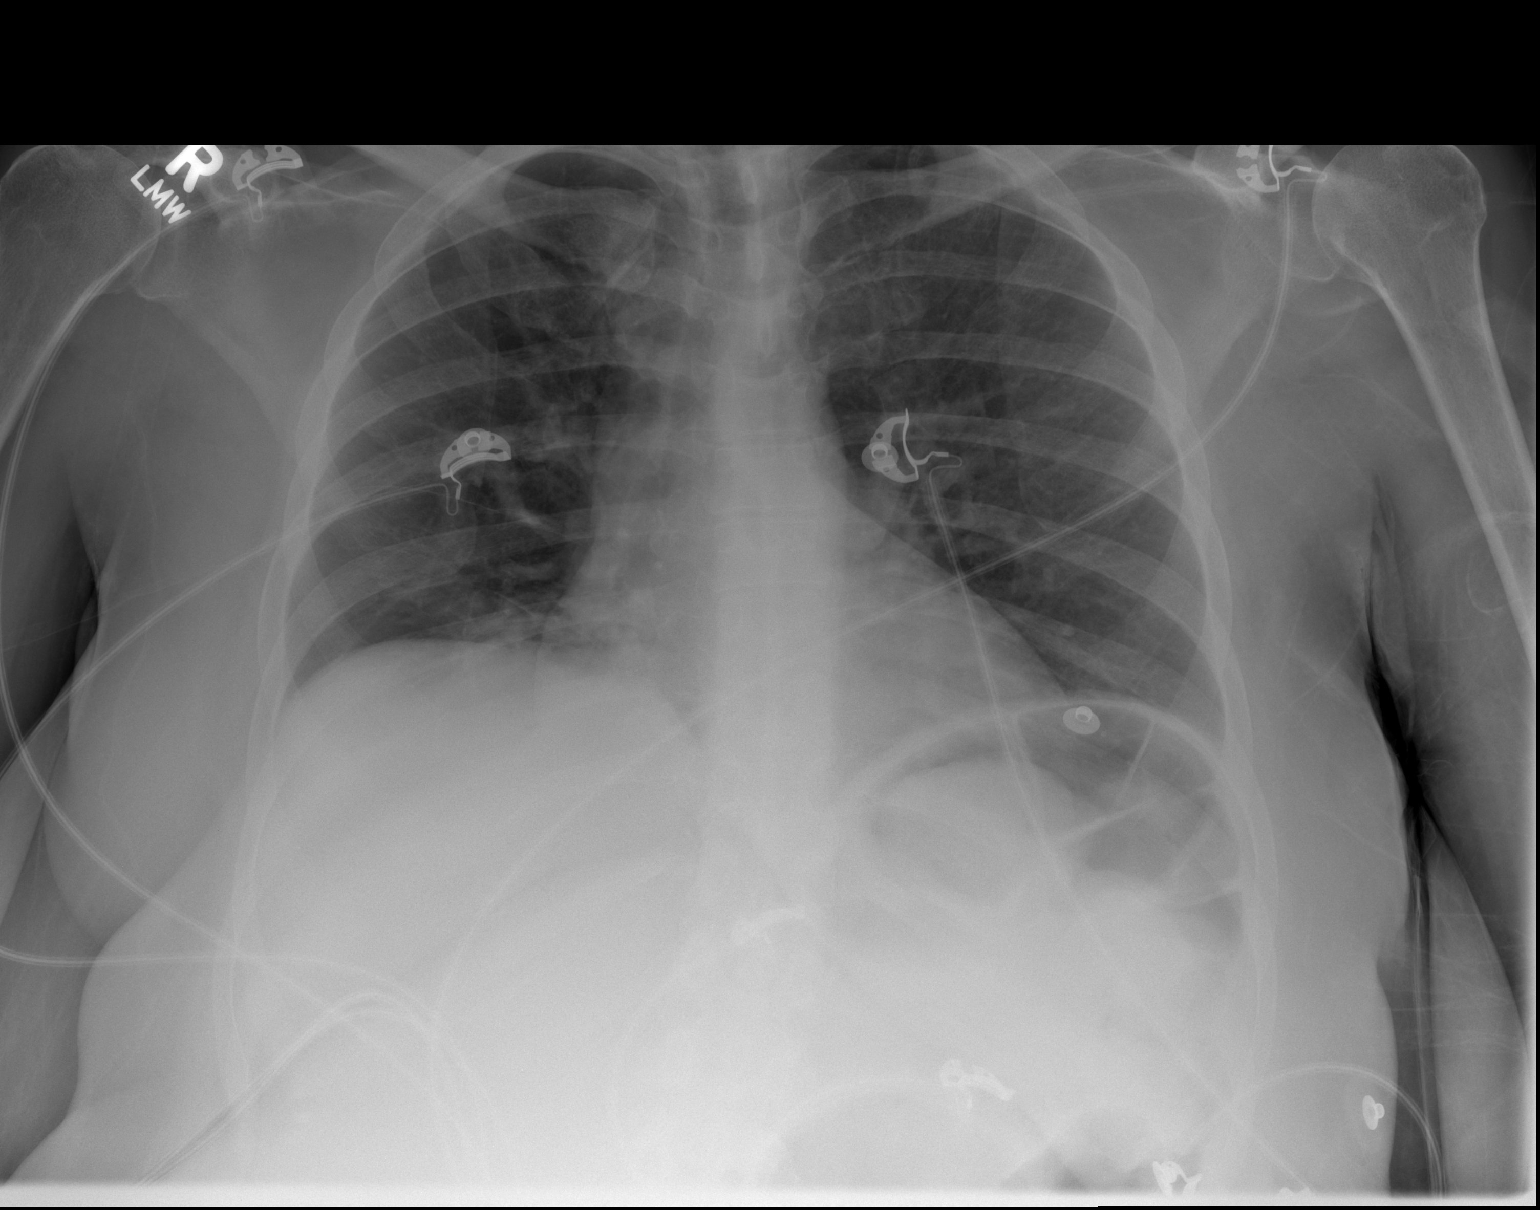

[2 of 2 positions shown; findings below may reference images not displayed]

FINDINGS: There is shallow inspiration. Minimal right lung base streaky
densities, likely atelectatic changes. Pneumonia is not entirely
excluded. Clinical correlation is recommended. There is no focal
consolidation, pleural effusion, or pneumothorax. The cardiac
silhouette is within normal limits. No acute osseous pathology.
IMPRESSION: Shallow inspiration with probable minimal right lung base
atelectatic changes. No focal consolidation.

## 2017-12-08 MED ORDER — AMANTADINE HCL 100 MG CAPSULE
ORAL_CAPSULE | 3 refills | 0 days | Status: CP
Start: 2017-12-08 — End: 2018-03-03

## 2018-02-02 MED ORDER — DULOXETINE 30 MG CAPSULE,DELAYED RELEASE
ORAL_CAPSULE | 5 refills | 0 days | Status: CP
Start: 2018-02-02 — End: 2018-09-05

## 2018-02-02 MED ORDER — GABAPENTIN 800 MG TABLET
ORAL_TABLET | 3 refills | 0 days | Status: CP
Start: 2018-02-02 — End: 2019-02-22

## 2018-02-15 DIAGNOSIS — G8220 Paraplegia, unspecified: Secondary | ICD-10-CM | POA: Diagnosis present

## 2018-03-05 ENCOUNTER — Encounter: Admit: 2018-03-05 | Discharge: 2018-03-05 | Payer: MEDICARE

## 2018-03-05 DIAGNOSIS — G8220 Paraplegia, unspecified: Secondary | ICD-10-CM

## 2018-03-05 DIAGNOSIS — Z0000 Encounter for general adult medical examination without abnormal findings: Secondary | ICD-10-CM

## 2018-03-05 DIAGNOSIS — N3000 Acute cystitis without hematuria: Secondary | ICD-10-CM

## 2018-03-05 DIAGNOSIS — Z9359 Other cystostomy status: Secondary | ICD-10-CM

## 2018-03-05 DIAGNOSIS — G35 Multiple sclerosis: Principal | ICD-10-CM

## 2018-03-12 MED ORDER — AMANTADINE HCL 100 MG CAPSULE
ORAL_CAPSULE | 3 refills | 0 days | Status: CP
Start: 2018-03-12 — End: 2018-09-05

## 2018-05-15 ENCOUNTER — Ambulatory Visit
Admit: 2018-05-15 | Discharge: 2018-05-16 | Payer: MEDICARE | Attending: Student in an Organized Health Care Education/Training Program | Primary: Student in an Organized Health Care Education/Training Program

## 2018-05-15 DIAGNOSIS — G35 Multiple sclerosis: Principal | ICD-10-CM

## 2018-05-15 DIAGNOSIS — T83518D Infection and inflammatory reaction due to other urinary catheter, subsequent encounter: Secondary | ICD-10-CM

## 2018-05-15 DIAGNOSIS — G8220 Paraplegia, unspecified: Secondary | ICD-10-CM

## 2018-05-15 DIAGNOSIS — Z87440 Personal history of urinary (tract) infections: Secondary | ICD-10-CM

## 2018-07-24 ENCOUNTER — Emergency Department: Payer: Medicare Other

## 2018-07-24 ENCOUNTER — Other Ambulatory Visit: Payer: Self-pay

## 2018-07-24 ENCOUNTER — Inpatient Hospital Stay
Admission: EM | Admit: 2018-07-24 | Discharge: 2018-07-26 | DRG: 872 | Disposition: A | Discharge status: Home / Self Care | Payer: Medicare Other | Attending: Internal Medicine | Admitting: Internal Medicine

## 2018-07-24 ENCOUNTER — Encounter: Payer: Self-pay | Admitting: Emergency Medicine

## 2018-07-24 DIAGNOSIS — F329 Major depressive disorder, single episode, unspecified: Secondary | ICD-10-CM | POA: Diagnosis present

## 2018-07-24 DIAGNOSIS — Z91040 Latex allergy status: Secondary | ICD-10-CM | POA: Diagnosis not present

## 2018-07-24 DIAGNOSIS — Z8349 Family history of other endocrine, nutritional and metabolic diseases: Secondary | ICD-10-CM

## 2018-07-24 DIAGNOSIS — K592 Neurogenic bowel, not elsewhere classified: Secondary | ICD-10-CM | POA: Diagnosis present

## 2018-07-24 DIAGNOSIS — G629 Polyneuropathy, unspecified: Secondary | ICD-10-CM | POA: Diagnosis present

## 2018-07-24 DIAGNOSIS — Z825 Family history of asthma and other chronic lower respiratory diseases: Secondary | ICD-10-CM

## 2018-07-24 DIAGNOSIS — N319 Neuromuscular dysfunction of bladder, unspecified: Secondary | ICD-10-CM | POA: Diagnosis present

## 2018-07-24 DIAGNOSIS — Z7982 Long term (current) use of aspirin: Secondary | ICD-10-CM

## 2018-07-24 DIAGNOSIS — Z90710 Acquired absence of both cervix and uterus: Secondary | ICD-10-CM

## 2018-07-24 DIAGNOSIS — Z936 Other artificial openings of urinary tract status: Secondary | ICD-10-CM | POA: Diagnosis not present

## 2018-07-24 DIAGNOSIS — B9620 Unspecified Escherichia coli [E. coli] as the cause of diseases classified elsewhere: Secondary | ICD-10-CM | POA: Diagnosis present

## 2018-07-24 DIAGNOSIS — B961 Klebsiella pneumoniae [K. pneumoniae] as the cause of diseases classified elsewhere: Secondary | ICD-10-CM | POA: Diagnosis present

## 2018-07-24 DIAGNOSIS — G35 Multiple sclerosis: Secondary | ICD-10-CM | POA: Diagnosis present

## 2018-07-24 DIAGNOSIS — N390 Urinary tract infection, site not specified: Secondary | ICD-10-CM | POA: Diagnosis present

## 2018-07-24 DIAGNOSIS — K5900 Constipation, unspecified: Secondary | ICD-10-CM | POA: Diagnosis present

## 2018-07-24 DIAGNOSIS — Z8249 Family history of ischemic heart disease and other diseases of the circulatory system: Secondary | ICD-10-CM | POA: Diagnosis not present

## 2018-07-24 DIAGNOSIS — Z87440 Personal history of urinary (tract) infections: Secondary | ICD-10-CM | POA: Diagnosis not present

## 2018-07-24 DIAGNOSIS — Z79899 Other long term (current) drug therapy: Secondary | ICD-10-CM

## 2018-07-24 DIAGNOSIS — A419 Sepsis, unspecified organism: Secondary | ICD-10-CM | POA: Diagnosis present

## 2018-07-24 LAB — CBC WITH DIFFERENTIAL/PLATELET
Abs Immature Granulocytes: 0.07 10*3/uL (ref 0.00–0.07)
Basophils Absolute: 0.1 10*3/uL (ref 0.0–0.1)
Basophils Relative: 0 %
Eosinophils Absolute: 0.1 10*3/uL (ref 0.0–0.5)
Eosinophils Relative: 1 %
HCT: 38.3 % (ref 36.0–46.0)
Hemoglobin: 12.3 g/dL (ref 12.0–15.0)
Immature Granulocytes: 1 %
Lymphocytes Relative: 12 %
Lymphs Abs: 1.7 10*3/uL (ref 0.7–4.0)
MCH: 28.9 pg (ref 26.0–34.0)
MCHC: 32.1 g/dL (ref 30.0–36.0)
MCV: 90.1 fL (ref 80.0–100.0)
Monocytes Absolute: 1.9 10*3/uL — ABNORMAL HIGH (ref 0.1–1.0)
Monocytes Relative: 14 %
Neutro Abs: 9.9 10*3/uL — ABNORMAL HIGH (ref 1.7–7.7)
Neutrophils Relative %: 72 %
Platelets: 315 10*3/uL (ref 150–400)
RBC: 4.25 MIL/uL (ref 3.87–5.11)
RDW: 13.2 % (ref 11.5–15.5)
WBC: 13.7 10*3/uL — ABNORMAL HIGH (ref 4.0–10.5)
nRBC: 0 % (ref 0.0–0.2)

## 2018-07-24 LAB — COMPREHENSIVE METABOLIC PANEL
ALT: 11 U/L (ref 0–44)
AST: 11 U/L — ABNORMAL LOW (ref 15–41)
Albumin: 3.5 g/dL (ref 3.5–5.0)
Alkaline Phosphatase: 68 U/L (ref 38–126)
Anion gap: 9 (ref 5–15)
BUN: 11 mg/dL (ref 6–20)
CO2: 23 mmol/L (ref 22–32)
Calcium: 8.4 mg/dL — ABNORMAL LOW (ref 8.9–10.3)
Chloride: 104 mmol/L (ref 98–111)
Creatinine, Ser: 0.63 mg/dL (ref 0.44–1.00)
GFR calc Af Amer: >60 mL/min (ref >60)
GFR calc non Af Amer: >60 mL/min (ref >60)
Glucose, Bld: 94 mg/dL (ref 70–99)
Potassium: 3.4 mmol/L — ABNORMAL LOW (ref 3.5–5.1)
Sodium: 136 mmol/L (ref 135–145)
Total Bilirubin: 1 mg/dL (ref 0.3–1.2)
Total Protein: 7.8 g/dL (ref 6.5–8.1)

## 2018-07-24 LAB — URINALYSIS, ROUTINE W REFLEX MICROSCOPIC
Bilirubin Urine: NEGATIVE
Glucose, UA: NEGATIVE mg/dL
Ketones, ur: 20 mg/dL — AB
Nitrite: POSITIVE — AB
Protein, ur: 30 mg/dL — AB
Specific Gravity, Urine: 1.009 (ref 1.005–1.030)
WBC, UA: >50 WBC/hpf — ABNORMAL HIGH (ref 0–5)
pH: 6 (ref 5.0–8.0)

## 2018-07-24 LAB — GLUCOSE, CAPILLARY
Glucose-Capillary: 64 mg/dL — ABNORMAL LOW (ref 70–99)
Glucose-Capillary: 87 mg/dL (ref 70–99)

## 2018-07-24 LAB — LACTIC ACID, PLASMA: Lactic Acid, Venous: 0.6 mmol/L (ref 0.5–1.9)

## 2018-07-24 LAB — PROTIME-INR
INR: 1.22
Prothrombin Time: 15.3 seconds — ABNORMAL HIGH (ref 11.4–15.2)

## 2018-07-24 LAB — TROPONIN I

## 2018-07-24 LAB — LIPASE, BLOOD: LIPASE: 19 U/L (ref 11–51)

## 2018-07-24 MED ORDER — IOHEXOL 300 MG/ML  SOLN: 30.0000 mL | Freq: Once | INTRAMUSCULAR | Status: DC | PRN

## 2018-07-24 MED ORDER — LACTULOSE 10 GM/15ML PO SOLN: 30.0000 g | Freq: Two times a day (BID) | ORAL | Status: DC | PRN

## 2018-07-24 MED ORDER — BACLOFEN 10 MG PO TABS
20.0000 mg | ORAL_TABLET | ORAL | Status: AC
  Administered 2018-07-24: 20 mg via ORAL
  Filled 2018-07-24: qty 2

## 2018-07-24 MED ORDER — SODIUM CHLORIDE 0.9 % IV SOLN
1.0000 g | INTRAVENOUS | Status: DC
  Administered 2018-07-24 – 2018-07-25 (×2): 1 g via INTRAVENOUS
  Filled 2018-07-24: qty 1
  Filled 2018-07-24: qty 10
  Filled 2018-07-24: qty 1

## 2018-07-24 MED ORDER — ACETAMINOPHEN 325 MG PO TABS
650.0000 mg | ORAL_TABLET | Freq: Four times a day (QID) | ORAL | Status: DC | PRN
  Administered 2018-07-25: 13:00:00 650 mg via ORAL
  Filled 2018-07-24 (×2): qty 2

## 2018-07-24 MED ORDER — GABAPENTIN 400 MG PO CAPS
800.0000 mg | ORAL_CAPSULE | Freq: Four times a day (QID) | ORAL | Status: DC
  Administered 2018-07-25 – 2018-07-26 (×4): 800 mg via ORAL
  Filled 2018-07-24 (×6): qty 2
  Filled 2018-07-24: qty 8
  Filled 2018-07-24 (×3): qty 2
  Filled 2018-07-24 (×2): qty 8

## 2018-07-24 MED ORDER — ACETAMINOPHEN 500 MG PO TABS
1000.0000 mg | ORAL_TABLET | ORAL | Status: AC
  Administered 2018-07-24: 1000 mg via ORAL
  Filled 2018-07-24: qty 2

## 2018-07-24 MED ORDER — ENOXAPARIN SODIUM 40 MG/0.4ML ~~LOC~~ SOLN
40.0000 mg | Freq: Two times a day (BID) | SUBCUTANEOUS | Status: DC
  Administered 2018-07-25 – 2018-07-26 (×3): 40 mg via SUBCUTANEOUS
  Filled 2018-07-24 (×3): qty 0.4

## 2018-07-24 MED ORDER — SODIUM CHLORIDE 0.9 % IV BOLUS
500.0000 mL | Freq: Once | INTRAVENOUS | Status: AC
  Administered 2018-07-24: 500 mL via INTRAVENOUS

## 2018-07-24 MED ORDER — ONDANSETRON HCL 4 MG/2ML IJ SOLN: 4.0000 mg | Freq: Four times a day (QID) | INTRAMUSCULAR | Status: DC | PRN

## 2018-07-24 MED ORDER — GABAPENTIN 400 MG PO CAPS
800.0000 mg | ORAL_CAPSULE | ORAL | Status: AC
  Administered 2018-07-24: 800 mg via ORAL
  Filled 2018-07-24: qty 2

## 2018-07-24 MED ORDER — PIPERACILLIN SOD-TAZOBACTAM SO 2.25 (2-0.25) G IV SOLR: Freq: Three times a day (TID) | INTRAVENOUS | Status: DC

## 2018-07-24 MED ORDER — PIPERACILLIN-TAZOBACTAM 3.375 G IVPB 30 MIN
3.3750 g | Freq: Once | INTRAVENOUS | Status: AC
  Administered 2018-07-24: 3.375 g via INTRAVENOUS
  Filled 2018-07-24: qty 50

## 2018-07-24 MED ORDER — DOCUSATE SODIUM 100 MG PO CAPS: 100.0000 mg | ORAL_CAPSULE | Freq: Two times a day (BID) | ORAL | Status: DC | PRN

## 2018-07-24 MED ORDER — ACETAMINOPHEN 650 MG RE SUPP: 650.0000 mg | Freq: Four times a day (QID) | RECTAL | Status: DC | PRN

## 2018-07-24 MED ORDER — DULOXETINE HCL 30 MG PO CPEP
30.0000 mg | ORAL_CAPSULE | ORAL | Status: AC
  Administered 2018-07-24: 30 mg via ORAL
  Filled 2018-07-24: qty 1

## 2018-07-24 MED ORDER — AMANTADINE HCL 100 MG PO CAPS
100.0000 mg | ORAL_CAPSULE | ORAL | Status: AC
  Administered 2018-07-24: 100 mg via ORAL
  Filled 2018-07-24: qty 1

## 2018-07-24 MED ORDER — ONDANSETRON HCL 4 MG PO TABS: 4.0000 mg | ORAL_TABLET | Freq: Four times a day (QID) | ORAL | Status: DC | PRN

## 2018-07-24 NOTE — ED Notes (Signed)
ED Provider at bedside. 

## 2018-07-24 NOTE — ED Triage Notes (Signed)
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  Mid to upper abd pain that is worse with palpation.  Low grade feve3r 3 days.  Suprapubic catheter in place  With history of utis.  Also stomach more swollen.

## 2018-07-24 NOTE — ED Notes (Signed)
Pt given crackers , drinks and peanut butter , fsbs 64, dr Fanny Bien aware.

## 2018-07-24 NOTE — ED Notes (Signed)
Report off to alecia rn  

## 2018-07-24 NOTE — ED Notes (Signed)
fsbs 87 after eating crackers and drink   Pt alert.

## 2018-07-24 NOTE — Progress Notes (Signed)
Pharmacy Antibiotic Note  [name] is a 47 y.o. female admitted on 07/24/2018 with UTI/sepsis.  Pharmacy has been consulted for Zosyn dosing.  Plan: Zosyn 3.375 gm IV x 1 in ED due to open urostomy and previous history of MRSA bacteremia and klebsiella bacteremia/UTI. Admitting will switch to ceftriaxone. Pharmacy will sign off. Please re-consult if further assistance is desired.  Height: 5\' 4"  (162.6 cm) Weight: 245 lb (111.1 kg) IBW/kg (Calculated) : 54.7  Temp (24hrs), Avg:100.5 F (38.1 C), Min:100.5 F (38.1 C), Max:100.5 F (38.1 C)  Recent Labs  Lab 07/24/18 1447  WBC 13.7*  CREATININE 0.63  LATICACIDVEN 0.6    Estimated Creatinine Clearance: 104.9 mL/min (by C-G formula based on SCr of 0.63 mg/dL).    Allergies  Allergen Reactions  . Latex Swelling and Other (See Comments)    Reaction:  Facial swelling     Antimicrobials this admission:   Dose adjustments this admission:   Microbiology results:  BCx:   UCx:    Sputum:    MRSA PCR:   Thank you for allowing pharmacy to be a part of this patient's care.  Carola Frost, Pharm.D., BCPS Clinical Pharmacist 07/24/2018 4:58 PM

## 2018-07-24 NOTE — ED Provider Notes (Signed)
[facility] Emergency Department Provider Note  ____________________________________________   First MD Initiated Contact with Patient 07/24/18 1103     (approximate)  I have reviewed the triage vital signs and the nursing notes.   HISTORY  Chief Complaint Abdominal Pain  HPI [name] is a 47 y.o. female here for evaluation of abdominal pain and feeling fevers  Patient reports since Saturday she is felt as though she had intermittent fever, having registered to 101.  In addition she reports she had a little bit of nausea not wanting to eat food but no vomiting.  Patient reports she is to build attack when she get infection for which she gets urinary tract infections quite easily, but as her MS has progressed her symptoms have not become is clear.  She reports she is very concerned she has an infection, things is likely in her urine but she cannot really tell anymore because of her sensory changes.  She has very progressive MS and she reports she is been told that medications are not helpful in slowing its course.  She is now immobile, has an aide at home with sister and is nonambulatory baseline  Denies cough.  No shortness of breath.  Has not noticed any notable change in the color of her urine collected from her urostomy.   Past Medical History:  Diagnosis Date  . Abdominal wall hernia 09/05/2014  . 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 Edema leg 09/05/2014  . 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 MS (multiple sclerosis) (HCC) 2005  . 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 Neurogenic bowel 08/12/2012  . Neuropathic pain 05/21/2015    Patient Active Problem List   Diagnosis Date Noted  . Altered mental status 04/10/2017  . Sepsis (HCC) 01/12/2016  . Complicated UTI (urinary tract infection) 08/09/2015  . Exacerbation of multiple sclerosis (HCC) 08/09/2015  . 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 Multiple sclerosis (HCC)   . Neuropathic pain 05/21/2015  . 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 History of spinal surgery 04/29/2015  . Edema leg 09/05/2014  . Adiposity 09/05/2014  . Abdominal wall hernia 09/05/2014  . 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 Neurogenic bowel 08/12/2012  . Infection of urinary tract 04/07/2006  . 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 ABDOMINAL HYSTERECTOMY  2001  . BACK SURGERY  2004  . REVISION UROSTOMY CUTANEOUS  06/2012    Prior to Admission medications   Medication Sig Start Date End Date Taking? Authorizing Provider  acetaminophen (TYLENOL) 500 MG tablet Take 500 mg by mouth every 6 (six) hours as needed.    [provider]  amantadine (SYMMETREL) 100 MG capsule Take 100 mg by mouth 2 (two) times daily.    [provider]  aspirin EC 325 MG EC tablet Take 1 tablet (325 mg total) by mouth daily. 04/15/17   Auburn Bilberry, MD  baclofen (LIORESAL) 20 MG tablet Take 20 mg by mouth 3 (three) times daily.    [provider]  cholecalciferol (VITAMIN D) 1000 units tablet Take 1,000 Units by mouth daily.    [provider]  DULoxetine (CYMBALTA) 30 MG capsule Take 30 mg by mouth 2 (two) times daily.    [provider]  gabapentin (NEURONTIN) 400 MG capsule Take 2 capsules (800 mg total) by mouth 4 (four) times  daily. 01/24/17   Alford Highland, MD  polyethylene glycol [facility] / Ethelene Hal) packet Take 17 g by mouth daily. 01/24/17   Alford Highland, MD    Allergies Latex  Family History  Problem Relation Age of Onset  . Osteoarthritis Mother   . Hypertension Mother   . Hyperlipidemia Mother   . Thyroid disease Mother   . Cancer Father   . Asthma Brother     Social History Social History   Tobacco Use  . Smoking status: Never Smoker  . Smokeless tobacco: Never Used   Substance Use Topics  . Alcohol use: Yes    Alcohol/week: 0.0 standard drinks  . Drug use: No    Review of Systems Constitutional: Fatigue with fever to 101, took Tylenol this morning which helps relieve fever Eyes: No visual changes. ENT: No sore throat. Cardiovascular: Denies chest pain. Respiratory: Denies shortness of breath. Gastrointestinal: See HPI Genitourinary: Negative for dysuria. Musculoskeletal: Negative for back pain. Skin: Negative for rash. Neurological: Negative for headaches, areas of focal weakness or numbness.    ____________________________________________   PHYSICAL EXAM:  VITAL SIGNS: ED Triage Vitals  Enc Vitals Group     BP 07/24/18 1102 122/61     Pulse Rate 07/24/18 1102 87     Resp 07/24/18 1106 18     Temp --      Temp src --      SpO2 07/24/18 1102 98 %     Weight 07/24/18 1105 245 lb (111.1 kg)     Height 07/24/18 1105 5\' 4"  (1.626 m)     Head Circumference --      Peak Flow --      Pain Score 07/24/18 1104 8     Pain Loc --      Pain Edu? --      Excl. in GC? --     Constitutional: Alert and oriented. Well appearing and in no acute distress.  She is pleasant, resting in no distress. Eyes: Conjunctivae are normal. Head: Atraumatic. Nose: No congestion/rhinnorhea. Mouth/Throat: Mucous membranes are moist. Neck: No stridor.  Cardiovascular: Normal rate, regular rhythm. Grossly normal heart sounds.  Good peripheral circulation. Respiratory: Normal respiratory effort.  No retractions. Lungs CTAB. Gastrointestinal: Soft and nontender. No distention.  Of note, however the patient does report that she has had sensory changes that she may not always noticed discomfort or pain.  I feel her abdominal examination is unreliable Musculoskeletal: No lower extremity tenderness with 2+ lower extremity pitting edema bilaterally without any erythema, skin breakdown or redness.  Perineum and back are examined without evidence of skin breakdown except  for some very slight erythema edema just around the sacrum, does not appear to be acute or cellulitic. neurologic:  Normal speech and language. No gross focal neurologic deficits are appreciated in the upper extremities, normal speech and smile.  He does have weakness in the lower extremities bilaterally which is chronic.  Skin:  Skin is warm, dry and intact. No rash noted. Psychiatric: Mood and affect are normal. Speech and behavior are normal.  ____________________________________________   LABS (all labs ordered are listed, but only abnormal results are displayed)  Labs Reviewed  URINALYSIS, ROUTINE W REFLEX MICROSCOPIC - Abnormal; Notable for the following components:      Result Value   Color, Urine YELLOW (*)    APPearance TURBID (*)    Hgb urine dipstick MODERATE (*)    Ketones, ur 20 (*)    Protein, ur 30 (*)  Nitrite POSITIVE (*)    Leukocytes, UA MODERATE (*)    WBC, UA >50 (*)    Bacteria, UA MANY (*)    Non Squamous Epithelial PRESENT (*)    All other components within normal limits  CBC WITH DIFFERENTIAL/PLATELET - Abnormal; Notable for the following components:   WBC 13.7 (*)    Neutro Abs 9.9 (*)    Monocytes Absolute 1.9 (*)    All other components within normal limits  COMPREHENSIVE METABOLIC PANEL - Abnormal; Notable for the following components:   Potassium 3.4 (*)    Calcium 8.4 (*)    AST 11 (*)    All other components within normal limits  PROTIME-INR - Abnormal; Notable for the following components:   Prothrombin Time 15.3 (*)    All other components within normal limits  GLUCOSE, CAPILLARY - Abnormal; Notable for the following components:   Glucose-Capillary 64 (*)    All other components within normal limits  CULTURE, BLOOD (ROUTINE X 2)  CULTURE, BLOOD (ROUTINE X 2)  URINE CULTURE  LACTIC ACID, PLASMA  LIPASE, BLOOD  TROPONIN I  LACTIC ACID, PLASMA  GLUCOSE, CAPILLARY  CBG MONITORING, ED  CBG MONITORING, ED    ____________________________________________  EKG  Reviewed and interpreted by me at 11:10 AM Heart rate 85 QRS 99 QTc 440 Normal sinus rhythm without evidence of ischemia or ectopy.  Possible LVH. ____________________________________________  RADIOLOGY  IMPRESSION: 1. There is a large stool burden predominantly involving the left colon and rectum. Wall thickening of the rectum may reflect proctitis. Stercoral colitis not excluded. 2. 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 No hydronephrosis identified. 3.  Aortic Atherosclerosis (ICD10-I70.0). 4. Small pleural effusions with bibasilar atelectasis. 5. Gallstones. Electronically Signed   By: Signa Kell M.D.   On: 07/24/2018 14:49   Dg Chest 2 View  Result Date: 07/24/2018 CLINICAL DATA:  Chest pain and fever EXAM: CHEST - 2 VIEW COMPARISON:  Chest radiograph April 10, 2017 and chest CT April 11, 2017 FINDINGS: There is bibasilar atelectasis. The lungs elsewhere are clear. Heart is upper normal in size with pulmonary vascularity normal. No adenopathy. No bone lesions. IMPRESSION: Bibasilar atelectasis. Lungs elsewhere clear. Stable cardiac silhouette. Electronically Signed   By: Bretta Bang III M.D.   On: 07/24/2018 11:39    CT scan reviewed, additionally chest x-ray reviewed. ____________________________________________   PROCEDURES  Procedure(s) performed: None  Procedures  Critical Care performed: No  ____________________________________________   INITIAL IMPRESSION / ASSESSMENT AND PLAN / ED COURSE  Pertinent labs & imaging results that were available during my care of the patient were reviewed by me and considered in my medical decision making (see chart for details).     Patient presents for evaluation of fatigue and fever, had some abdominal discomfort that she reports is being mid to upper abdomen off and on since Saturday.  Nontoxic and  well-appearing here, but certainly at high risk for infectious etiology other acute intra-abdominal etiology we may not be able to easily detect due to her sensory losses.  Will initiate a broad work-up, patient does have a history of UTI, sepsis as well as pneumonia in the past has been admitted several times for infectious etiologies.  No evidence of acute cardiopulmonary abnormality or acute neurologic condition of than her known MS which does not appear to be acutely flaring at this time    Clinical Course as of Jul 25 1631  Tue Jul 24, 2018  1223 Labs delay due to poor access. Lab to attempt.    [MQ]  1335 Evaluated both upper extremities for IV guided ultrasound and lab draw, unable to identify a suitable vein. Lab team at bedside now evaluating.    [MQ]    Clinical Course User Index [MQ] Sharyn Creamer, MD   ----------------------------------------- 4:32 PM on 07/24/2018 -----------------------------------------  Patient admitted for further care and management to her previous history of bacteremia, urinary tract infection and meeting sepsis criteria today.  Patient agreeable and understanding of plan for admission.  ____________________________________________   FINAL CLINICAL IMPRESSION(S) / ED DIAGNOSES  Final diagnoses:  Urinary tract infection, acute  Sepsis, due to unspecified organism, unspecified whether acute organ dysfunction present [facility])        Note:  This document was prepared using Dragon voice recognition software and may include unintentional dictation errors       Sharyn Creamer, MD 07/24/18 1633

## 2018-07-24 NOTE — ED Notes (Signed)
Per report from [place], [place], pt has been stuck multiple times while attempting to get 2nd blood culture set. Pt does not wish to be stuck any more. MD aware only 1 set of cultures sent per report.

## 2018-07-24 NOTE — Progress Notes (Signed)
CODE SEPSIS - PHARMACY COMMUNICATION  **Broad Spectrum Antibiotics should be administered within 1 hour of Sepsis diagnosis**  Time Code Sepsis Called/Page Received: 16:18  Antibiotics Ordered: Zosyn  Time of 1st antibiotic administration: 16:06  Additional action taken by pharmacy:   If necessary, Name of Provider/Nurse Contacted:     Carola Frost, Pharm.D., BCPS Clinical Pharmacist  07/24/2018  4:19 PM

## 2018-07-24 NOTE — Progress Notes (Signed)
Lovenox changed to 40 mg BID for CrCl >30 and BMI >40. 

## 2018-07-24 NOTE — H&P (Addendum)
[place] at [place] NAME: [name]    MR#:  [id number]  DATE OF BIRTH:  01-23-1971  DATE OF ADMISSION:  07/24/2018  PRIMARY CARE PHYSICIAN: [place]   REQUESTING/REFERRING PHYSICIAN: Dr. Delman Kitten  CHIEF COMPLAINT:   Chief Complaint  Patient presents with  . Abdominal Pain    HISTORY OF PRESENT ILLNESS:  [name]  is a 47 y.o. female with a known history of multiple sclerosis, neurogenic bladder status post urostomy, previous history of UTI, neurogenic bowel, constipation who presented to the hospital due to abdominal pain.  Patient says she was in her usual state of health when she developed some abdominal pain associated with some low-grade fever at home.  SHe has a home health nurse who noted that she had had a bowel movement in a few days and therefore she received an enema and did have a large soft bowel movement but despite that her abdominal pain did not improve and she continued to have low-grade fevers therefore she was sent to the ER for further evaluation.  In the emergency room patient was noted to have abnormal urinalysis consistent with a UTI, she had a low-grade fever and noted to have a leukocytosis and met criteria for sepsis secondary to UTI and therefore hospitalist services were contacted for admission.  PAST MEDICAL HISTORY:   Past Medical History:  Diagnosis Date  . Abdominal wall hernia 09/05/2014  . 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 Edema leg 09/05/2014  . 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 MS (multiple sclerosis) ([place]) 2005  . Multiple sclerosis, primary progressive ([place]) 01/09/2013  . Neurogenic bowel 08/12/2012  . Neuropathic pain 05/21/2015    PAST SURGICAL HISTORY:   Past Surgical History:  Procedure Laterality Date  . ABDOMINAL HYSTERECTOMY  2001  . BACK SURGERY  2004  . REVISION UROSTOMY CUTANEOUS   06/2012    SOCIAL HISTORY:   Social History   Tobacco Use  . Smoking status: Never Smoker  . Smokeless tobacco: Never Used  Substance Use Topics  . Alcohol use: Yes    Alcohol/week: 0.0 standard drinks    Comment: socially    FAMILY HISTORY:   Family History  Problem Relation Age of Onset  . Osteoarthritis Mother   . Hypertension Mother   . Hyperlipidemia Mother   . Thyroid disease Mother   . Liver cancer Father   . Asthma Brother     DRUG ALLERGIES:   Allergies  Allergen Reactions  . Latex Swelling and Other (See Comments)    Reaction:  Facial swelling     REVIEW OF SYSTEMS:   Review of Systems  Constitutional: Positive for fever. Negative for weight loss.  HENT: Negative for congestion, nosebleeds and tinnitus.   Eyes: Negative for blurred vision, double vision and redness.  Respiratory: Negative for cough, hemoptysis and shortness of breath.   Cardiovascular: Negative for chest pain, orthopnea, leg swelling and PND.  Gastrointestinal: Positive for abdominal pain. Negative for diarrhea, melena, nausea and vomiting.  Genitourinary: Negative for dysuria, hematuria and urgency.  Musculoskeletal: Negative for falls and joint pain.  Neurological: Negative for dizziness, tingling, sensory change, focal weakness, seizures, weakness and headaches.  Endo/Heme/Allergies: Negative for polydipsia. Does not bruise/bleed easily.  Psychiatric/Behavioral: Negative for depression and memory loss. The patient is not nervous/anxious.     MEDICATIONS AT HOME:   Prior to Admission medications   Medication  Sig Start Date End Date Taking? Authorizing Provider  acetaminophen (TYLENOL) 500 MG tablet Take 500 mg by mouth every 6 (six) hours as needed.   Yes [provider]  amantadine (SYMMETREL) 100 MG capsule Take 100 mg by mouth 2 (two) times daily.   Yes [provider]  baclofen (LIORESAL) 20 MG tablet Take 20 mg by mouth 2 (two) times daily.    Yes [provider]  cholecalciferol (VITAMIN D) 1000 units tablet Take 1,000 Units by mouth daily.   Yes [provider]  docusate sodium (COLACE) 100 MG capsule Take 100 mg by mouth 2 (two) times daily as needed for mild constipation.   Yes [provider]  DULoxetine (CYMBALTA) 30 MG capsule Take 30 mg by mouth 2 (two) times daily.   Yes [provider]  gabapentin (NEURONTIN) 800 MG tablet Take 800 mg by mouth 4 (four) times daily.   Yes [provider]  aspirin EC 325 MG EC tablet Take 1 tablet (325 mg total) by mouth daily. Patient not taking: Reported on 07/24/2018 04/15/17   Dustin Flock, MD  gabapentin (NEURONTIN) 400 MG capsule Take 2 capsules (800 mg total) by mouth 4 (four) times daily. Patient not taking: Reported on 07/24/2018 01/24/17   Loletha Grayer, MD  polyethylene glycol [facility] / Floria Raveling) packet Take 17 g by mouth daily. Patient not taking: Reported on 07/24/2018 01/24/17   Loletha Grayer, MD      VITAL SIGNS:  Blood pressure 126/87, pulse 86, temperature (!) 100.5 F (38.1 C), temperature source Oral, resp. rate 20, height '5\' 4"'$  (1.626 m), weight 111.1 kg, SpO2 99 %.  PHYSICAL EXAMINATION:  Physical Exam  GENERAL:  47 y.o.-year-old patient lying in the bed in no acute distress.  EYES: Pupils equal, round, reactive to light and accommodation. No scleral icterus. Extraocular muscles intact.  HEENT: Head atraumatic, normocephalic. Oropharynx and nasopharynx clear. No oropharyngeal erythema, moist oral mucosa  NECK:  Supple, no jugular venous distention. No thyroid enlargement, no tenderness.  LUNGS: Normal breath sounds bilaterally, no wheezing, rales, rhonchi. No use of accessory muscles of respiration.  CARDIOVASCULAR: S1, S2 RRR. No murmurs, rubs, gallops, clicks.  ABDOMEN: Soft, nontender, nondistended. Bowel sounds present. No organomegaly or mass.  + Urostomy in place with yellow urine draining with some sediment noted.    EXTREMITIES: No pedal edema, cyanosis, or clubbing. + 2 pedal & radial pulses b/l.   NEUROLOGIC: Cranial nerves II through XII are intact. No focal Motor or sensory deficits appreciated b/l. Globally weak PSYCHIATRIC: The patient is alert and oriented x 3. Good affect.  SKIN: No obvious rash, lesion, or ulcer.   LABORATORY PANEL:   CBC Recent Labs  Lab 07/24/18 1447  WBC 13.7*  HGB 12.3  HCT 38.3  PLT 315   ------------------------------------------------------------------------------------------------------------------  Chemistries  Recent Labs  Lab 07/24/18 1447  NA 136  K 3.4*  CL 104  CO2 23  GLUCOSE 94  BUN 11  CREATININE 0.63  CALCIUM 8.4*  AST 11*  ALT 11  ALKPHOS 68  BILITOT 1.0   ------------------------------------------------------------------------------------------------------------------  Cardiac Enzymes Recent Labs  Lab 07/24/18 1447  TROPONINI <0.03   ------------------------------------------------------------------------------------------------------------------  RADIOLOGY:  Ct Abdomen Pelvis Wo Contrast  Result Date: 07/24/2018 CLINICAL DATA:  Abdominal pain.  Multiple sclerosis.  Paraplegia. EXAM: CT ABDOMEN AND PELVIS WITHOUT CONTRAST TECHNIQUE: Multidetector CT imaging of the abdomen and pelvis was performed following the standard protocol without IV contrast. COMPARISON:  01/21/2017 FINDINGS: Lower chest: Small bilateral  pleural effusions and bilateral subsegmental atelectasis. Asymmetric elevation of left hemidiaphragm is noted with overlying passive atelectasis. Hepatobiliary: No focal liver abnormality. Tiny stones within the dependent portion of the gallbladder measure up to 4 mm. No gallbladder wall thickening. No biliary ductal dilatation identified. Pancreas: Unremarkable. No pancreatic ductal dilatation or surrounding inflammatory changes. Spleen: Normal in size without focal abnormality. Adrenals/Urinary Tract: Normal appearance of  the adrenal glands. Atrophy of the mid and inferior pole of right kidney noted. Left kidney unremarkable. No kidney stones or hydronephrosis. No suprapubic catheter identified. There is a right lower quadrant ileal diversion loop. Stomach/Bowel: The stomach appears normal. No abnormal small bowel dilatation identified. Right lower quadrant parastomal hernia is identified which contains a nonobstructed loop of small bowel. Enteric contrast material is identified within the colon. Large stool burden identified at the level of the distal transverse colon through to the rectum. There is wall thickening involving the rectum which appears increased from previous exam. Vascular/Lymphatic: Aortic atherosclerosis. No aneurysm. Small retroperitoneal lymph nodes identified. No adenopathy within the abdomen or pelvis. Reproductive: Status post hysterectomy.  No adnexal mass. Other: No free fluid or fluid collections. Musculoskeletal: Previous posterior decompression and hardware fixation of the lumbar spine. IMPRESSION: 1. There is a large stool burden predominantly involving the left colon and rectum. Wall thickening of the rectum may reflect proctitis. Stercoral colitis not excluded. 2. 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 No hydronephrosis identified. 3.  Aortic Atherosclerosis (ICD10-I70.0). 4. Small pleural effusions with bibasilar atelectasis. 5. Gallstones. Electronically Signed   By: Kerby Moors M.D.   On: 07/24/2018 14:49   Dg Chest 2 View  Result Date: 07/24/2018 CLINICAL DATA:  Chest pain and fever EXAM: CHEST - 2 VIEW COMPARISON:  Chest radiograph April 10, 2017 and chest CT April 11, 2017 FINDINGS: There is bibasilar atelectasis. The lungs elsewhere are clear. Heart is upper normal in size with pulmonary vascularity normal. No adenopathy. No bone lesions. IMPRESSION: Bibasilar atelectasis. Lungs elsewhere clear. Stable cardiac  silhouette. Electronically Signed   By: Lowella Grip III M.D.   On: 07/24/2018 11:39     IMPRESSION AND PLAN:   47 year old female with past medical history of multiple sclerosis, neurogenic bladder status post urostomy, neurogenic bowel, previous history of UTI who presents to the hospital due to abdominal pain nausea and fever and noted to have a UTI with suspected sepsis.  1.  Sepsis-patient meets criteria given her low-grade fever, leukocytosis and abnormal urinalysis.  - patient's previous UTI was due to Klebsiella, patient received IV Zosyn in the ER but I will treat the patient was just IV ceftriaxone, follow urine and blood cultures.  Follow fever curve, white cell count and hemodynamics.  2.  Urinary tract infection-based off a urinalysis on admission. - Treat patient with IV ceftriaxone, follow urine cultures.  3.  Leukocytosis-secondary to the UTI.  Follow with IV antibiotic therapy.  4.  History of multiple sclerosis- continue baclofen, amantadine.  5.  Neuropathy-continue gabapentin.  6.  Depression-continue Cymbalta.  7.  Constipation-patient received enema earlier this week and responded to it, CT scan still showing some stool burden.  We will place the patient on some lactulose as needed   All the records are reviewed and case discussed with ED provider. Management plans discussed with the patient, family and they are in agreement.  CODE STATUS: Full code  TOTAL TIME TAKING CARE OF THIS PATIENT: 45 minutes.  Henreitta Leber M.D on 07/24/2018 at 4:50 PM  Between 7am to 6pm - Pager - [phone]  After 6pm go to [email or web address] - password [email or web address] [place] [place] [facility]  Office  [phone]  CC: Primary care physician; [place]

## 2018-07-24 NOTE — ED Notes (Signed)
IT called.   Scanner broken in room 16

## 2018-07-24 NOTE — ED Notes (Signed)
Brittany RN, aware of bed assigned  

## 2018-07-24 NOTE — ED Provider Notes (Signed)
[facility] Emergency Department Provider Note  ____________________________________________   First MD Initiated Contact with Patient 07/24/18 1103     (approximate)  I have reviewed the triage vital signs and the nursing notes.   HISTORY  Chief Complaint Abdominal Pain  HPI [name] is a 47 y.o. female here for evaluation of abdominal pain and feeling fevers  Patient reports since Saturday she is felt as though she had intermittent fever, having registered to 101.  In addition she reports she had a little bit of nausea not wanting to eat food but no vomiting.  Patient reports she is to build attack when she get infection for which she gets urinary tract infections quite easily, but as her MS has progressed her symptoms have not become is clear.  She reports she is very concerned she has an infection, things is likely in her urine but she cannot really tell anymore because of her sensory changes.  She has very progressive MS and she reports she is been told that medications are not helpful in slowing its course.  She is now immobile, has an aide at home with sister and is nonambulatory baseline  Denies cough.  No shortness of breath.  Has not noticed any notable change in the color of her urine collected from her urostomy.   Past Medical History:  Diagnosis Date  . Abdominal wall hernia 09/05/2014  . 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 Edema leg 09/05/2014  . 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 MS (multiple sclerosis) (HCC) 2005  . 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 Neurogenic bowel 08/12/2012  . Neuropathic pain 05/21/2015    Patient Active Problem List   Diagnosis Date Noted  . Altered mental status 04/10/2017  . Sepsis (HCC) 01/12/2016  . Complicated UTI (urinary tract infection) 08/09/2015  . Exacerbation of multiple sclerosis (HCC) 08/09/2015  . 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 Multiple sclerosis (HCC)   . Neuropathic pain 05/21/2015  . 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 History of spinal surgery 04/29/2015  . Edema leg 09/05/2014  . Adiposity 09/05/2014  . Abdominal wall hernia 09/05/2014  . 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 Neurogenic bowel 08/12/2012  . Infection of urinary tract 04/07/2006  . 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 ABDOMINAL HYSTERECTOMY  2001  . BACK SURGERY  2004  . REVISION UROSTOMY CUTANEOUS  06/2012    Prior to Admission medications   Medication Sig Start Date End Date Taking? Authorizing Provider  acetaminophen (TYLENOL) 500 MG tablet Take 500 mg by mouth every 6 (six) hours as needed.    [provider]  amantadine (SYMMETREL) 100 MG capsule Take 100 mg by mouth 2 (two) times daily.    [provider]  aspirin EC 325 MG EC tablet Take 1 tablet (325 mg total) by mouth daily. 04/15/17   Auburn Bilberry, MD  baclofen (LIORESAL) 20 MG tablet Take 20 mg by mouth 3 (three) times daily.    [provider]  cholecalciferol (VITAMIN D) 1000 units tablet Take 1,000 Units by mouth daily.    [provider]  DULoxetine (CYMBALTA) 30 MG capsule Take 30 mg by mouth 2 (two) times daily.    [provider]  gabapentin (NEURONTIN) 400 MG capsule Take 2 capsules (800 mg total) by mouth 4 (four) times  daily. 01/24/17   Alford Highland, MD  polyethylene glycol [facility] Ethelene Hal) packet Take 17 g by mouth daily. 01/24/17   Alford Highland, MD    Allergies Latex  Family History  Problem Relation Age of Onset  . Osteoarthritis Mother   . Hypertension Mother   . Hyperlipidemia Mother   . Thyroid disease Mother   . Cancer Father   . Asthma Brother     Social History Social History   Tobacco Use  . Smoking status: Never Smoker  . Smokeless tobacco: Never Used   Substance Use Topics  . Alcohol use: Yes    Alcohol/week: 0.0 standard drinks  . Drug use: No    Review of Systems Constitutional: Fatigue with fever to 101, took Tylenol this morning which helps relieve fever Eyes: No visual changes. ENT: No sore throat. Cardiovascular: Denies chest pain. Respiratory: Denies shortness of breath. Gastrointestinal: See HPI Genitourinary: Negative for dysuria. Musculoskeletal: Negative for back pain. Skin: Negative for rash. Neurological: Negative for headaches, areas of focal weakness or numbness.    ____________________________________________   PHYSICAL EXAM:  VITAL SIGNS: ED Triage Vitals  Enc Vitals Group     BP 07/24/18 1102 122/61     Pulse Rate 07/24/18 1102 87     Resp 07/24/18 1106 18     Temp --      Temp src --      SpO2 07/24/18 1102 98 %     Weight 07/24/18 1105 245 lb (111.1 kg)     Height 07/24/18 1105 5\' 4"  (1.626 m)     Head Circumference --      Peak Flow --      Pain Score 07/24/18 1104 8     Pain Loc --      Pain Edu? --      Excl. in GC? --     Constitutional: Alert and oriented. Well appearing and in no acute distress.  She is pleasant, resting in no distress. Eyes: Conjunctivae are normal. Head: Atraumatic. Nose: No congestion/rhinnorhea. Mouth/Throat: Mucous membranes are moist. Neck: No stridor.  Cardiovascular: Normal rate, regular rhythm. Grossly normal heart sounds.  Good peripheral circulation. Respiratory: Normal respiratory effort.  No retractions. Lungs CTAB. Gastrointestinal: Soft and nontender. No distention.  Of note, however the patient does report that she has had sensory changes that she may not always noticed discomfort or pain.  I feel her abdominal examination is unreliable Musculoskeletal: No lower extremity tenderness with 2+ lower extremity pitting edema bilaterally without any erythema, skin breakdown or redness.  Perineum and back are examined without evidence of skin breakdown except  for some very slight erythema edema just around the sacrum, does not appear to be acute or cellulitic. neurologic:  Normal speech and language. No gross focal neurologic deficits are appreciated in the upper extremities, normal speech and smile.  He does have weakness in the lower extremities bilaterally which is chronic.  Skin:  Skin is warm, dry and intact. No rash noted. Psychiatric: Mood and affect are normal. Speech and behavior are normal.  ____________________________________________   LABS (all labs ordered are listed, but only abnormal results are displayed)  Labs Reviewed  URINALYSIS, ROUTINE W REFLEX MICROSCOPIC - Abnormal; Notable for the following components:      Result Value   Color, Urine YELLOW (*)    APPearance TURBID (*)    Hgb urine dipstick MODERATE (*)    Ketones, ur 20 (*)    Protein, ur 30 (*)  Nitrite POSITIVE (*)    Leukocytes, UA MODERATE (*)    WBC, UA >50 (*)    Bacteria, UA MANY (*)    Non Squamous Epithelial PRESENT (*)    All other components within normal limits  CBC WITH DIFFERENTIAL/PLATELET - Abnormal; Notable for the following components:   WBC 13.7 (*)    Neutro Abs 9.9 (*)    Monocytes Absolute 1.9 (*)    All other components within normal limits  COMPREHENSIVE METABOLIC PANEL - Abnormal; Notable for the following components:   Potassium 3.4 (*)    Calcium 8.4 (*)    AST 11 (*)    All other components within normal limits  PROTIME-INR - Abnormal; Notable for the following components:   Prothrombin Time 15.3 (*)    All other components within normal limits  GLUCOSE, CAPILLARY - Abnormal; Notable for the following components:   Glucose-Capillary 64 (*)    All other components within normal limits  CULTURE, BLOOD (ROUTINE X 2)  CULTURE, BLOOD (ROUTINE X 2)  URINE CULTURE  LACTIC ACID, PLASMA  LIPASE, BLOOD  TROPONIN I  LACTIC ACID, PLASMA  CBG MONITORING, ED  CBG MONITORING, ED    ____________________________________________  EKG  Reviewed and interpreted by me at 11:10 AM Heart rate 85 QRS 99 QTc 440 Normal sinus rhythm without evidence of ischemia or ectopy.  Possible LVH. ____________________________________________  RADIOLOGY  Ct Abdomen Pelvis Wo Contrast  Result Date: 07/24/2018 CLINICAL DATA:  Abdominal pain.  Multiple sclerosis.  Paraplegia. EXAM: CT ABDOMEN AND PELVIS WITHOUT CONTRAST TECHNIQUE: Multidetector CT imaging of the abdomen and pelvis was performed following the standard protocol without IV contrast. COMPARISON:  01/21/2017 FINDINGS: Lower chest: Small bilateral pleural effusions and bilateral subsegmental atelectasis. Asymmetric elevation of left hemidiaphragm is noted with overlying passive atelectasis. Hepatobiliary: No focal liver abnormality. Tiny stones within the dependent portion of the gallbladder measure up to 4 mm. No gallbladder wall thickening. No biliary ductal dilatation identified. Pancreas: Unremarkable. No pancreatic ductal dilatation or surrounding inflammatory changes. Spleen: Normal in size without focal abnormality. Adrenals/Urinary Tract: Normal appearance of the adrenal glands. Atrophy of the mid and inferior pole of right kidney noted. Left kidney unremarkable. No kidney stones or hydronephrosis. No suprapubic catheter identified. There is a right lower quadrant ileal diversion loop. Stomach/Bowel: The stomach appears normal. No abnormal small bowel dilatation identified. Right lower quadrant parastomal hernia is identified which contains a nonobstructed loop of small bowel. Enteric contrast material is identified within the colon. Large stool burden identified at the level of the distal transverse colon through to the rectum. There is wall thickening involving the rectum which appears increased from previous exam. Vascular/Lymphatic: Aortic atherosclerosis. No aneurysm. Small retroperitoneal lymph nodes identified. No adenopathy  within the abdomen or pelvis. Reproductive: Status post hysterectomy.  No adnexal mass. Other: No free fluid or fluid collections. Musculoskeletal: Previous posterior decompression and hardware fixation of the lumbar spine. IMPRESSION: 1. There is a large stool burden predominantly involving the left colon and rectum. Wall thickening of the rectum may reflect proctitis. Stercoral colitis not excluded. 2. 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 No hydronephrosis identified. 3.  Aortic Atherosclerosis (ICD10-I70.0). 4. Small pleural effusions with bibasilar atelectasis. 5. Gallstones. Electronically Signed   By: Signa Kell M.D.   On: 07/24/2018 14:49   Dg Chest 2 View  Result Date: 07/24/2018 CLINICAL DATA:  Chest pain and fever EXAM: CHEST - 2 VIEW COMPARISON:  Chest radiograph April 10, 2017 and chest CT April 11, 2017 FINDINGS: There is bibasilar atelectasis. The lungs elsewhere are clear. Heart is upper normal in size with pulmonary vascularity normal. No adenopathy. No bone lesions. IMPRESSION: Bibasilar atelectasis. Lungs elsewhere clear. Stable cardiac silhouette. Electronically Signed   By: Bretta Bang III M.D.   On: 07/24/2018 11:39    ____________________________________________   PROCEDURES  Procedure(s) performed: None  Procedures  Critical Care performed: No  ____________________________________________   INITIAL IMPRESSION / ASSESSMENT AND PLAN / ED COURSE  Pertinent labs & imaging results that were available during my care of the patient were reviewed by me and considered in my medical decision making (see chart for details).     Patient presents for evaluation of fatigue and fever, had some abdominal discomfort that she reports is being mid to upper abdomen off and on since Saturday.  Nontoxic and well-appearing here, but certainly at high risk for infectious etiology other acute intra-abdominal  etiology we may not be able to easily detect due to her sensory losses.  Will initiate a broad work-up, patient does have a history of UTI, sepsis as well as pneumonia in the past has been admitted several times for infectious etiologies.  No evidence of acute cardiopulmonary abnormality or acute neurologic condition of than her known MS which does not appear to be acutely flaring at this time    Clinical Course as of Jul 24 1612  Tue Jul 24, 2018  1223 Labs delay due to poor access. Lab to attempt.    [MQ]  1335 Evaluated both upper extremities for IV guided ultrasound and lab draw, unable to identify a suitable vein. Lab team at bedside now evaluating.    [MQ]    Clinical Course User Index [MQ] Sharyn Creamer, MD   ----------------------------------------- 4:01 PM on 07/24/2018 -----------------------------------------  Patient evaluation positive for urine analysis with UTI, leukocytosis and now febrile 100.5.  Patient meets sepsis criteria, patient "code sepsis' initiated, but patient not presently requiring pressors or large volume fluid resuscitation.  Hemodynamics indicate normal blood pressure, awake and alert without evidence of hypotension.  Patient is appropriate for admission at this time, discussed with the patient she and her mother would like for her to be admitted due to her strong history of previous bacteremia.  Reviewed with pharmacist her previous culture data, will select Zosyn at this time for suspected UTI broad coverage.  ____________________________________________   FINAL CLINICAL IMPRESSION(S) / ED DIAGNOSES  Final diagnoses:  Urinary tract infection, acute  Sepsis, due to unspecified organism, unspecified whether acute organ dysfunction present [facility])        Note:  This document was prepared using Dragon voice recognition software and may include unintentional dictation errors       Sharyn Creamer, MD 07/24/18 [id number]

## 2018-07-24 NOTE — ED Notes (Signed)
Resumed care from kim rn.  Pt alert.   Labs drawn by RT.  nsr on monitor.  Skin warm and dry.  family with pt.

## 2018-07-24 NOTE — ED Notes (Signed)
Report was called and given to [place], [place]

## 2018-07-24 NOTE — ED Notes (Signed)
primedoc in with pt now 

## 2018-07-24 NOTE — ED Notes (Signed)
Pt is resting in bed. Respirations even/unlabored. NADN 

## 2018-07-25 LAB — CBC
HEMATOCRIT: 39.1 % (ref 36.0–46.0)
Hemoglobin: 12.4 g/dL (ref 12.0–15.0)
MCH: 29.1 pg (ref 26.0–34.0)
MCHC: 31.7 g/dL (ref 30.0–36.0)
MCV: 91.8 fL (ref 80.0–100.0)
Platelets: 321 10*3/uL (ref 150–400)
RBC: 4.26 MIL/uL (ref 3.87–5.11)
RDW: 13.3 % (ref 11.5–15.5)
WBC: 9.5 10*3/uL (ref 4.0–10.5)
nRBC: 0 % (ref 0.0–0.2)

## 2018-07-25 LAB — BASIC METABOLIC PANEL
Anion gap: 10 (ref 5–15)
BUN: 11 mg/dL (ref 6–20)
CALCIUM: 8.6 mg/dL — AB (ref 8.9–10.3)
CHLORIDE: 102 mmol/L (ref 98–111)
CO2: 26 mmol/L (ref 22–32)
CREATININE: 0.81 mg/dL (ref 0.44–1.00)
GFR calc Af Amer: >60 mL/min (ref >60)
GFR calc non Af Amer: >60 mL/min (ref >60)
Glucose, Bld: 95 mg/dL (ref 70–99)
Potassium: 3.4 mmol/L — ABNORMAL LOW (ref 3.5–5.1)
SODIUM: 138 mmol/L (ref 135–145)

## 2018-07-25 MED ORDER — LACTULOSE 10 GM/15ML PO SOLN
30.0000 g | Freq: Two times a day (BID) | ORAL | Status: DC
  Administered 2018-07-25 – 2018-07-26 (×3): 30 g via ORAL
  Filled 2018-07-25 (×3): qty 60

## 2018-07-25 MED ORDER — SENNOSIDES-DOCUSATE SODIUM 8.6-50 MG PO TABS
2.0000 | ORAL_TABLET | Freq: Two times a day (BID) | ORAL | Status: DC
  Administered 2018-07-25 – 2018-07-26 (×3): 2 via ORAL
  Filled 2018-07-25 (×3): qty 2

## 2018-07-25 NOTE — Progress Notes (Signed)
[employer] - [place] at [facility]   PATIENT NAME: [name]    MR#:  [id number]  DATE OF BIRTH:  Apr 09, 1971  SUBJECTIVE:  CHIEF COMPLAINT:   Chief Complaint  Patient presents with  . Abdominal Pain  c/o abd pain and constipation. REVIEW OF SYSTEMS:  Review of Systems  Constitutional: Negative for chills, fever and weight loss.  HENT: Negative for nosebleeds and sore throat.   Eyes: Negative for blurred vision.  Respiratory: Negative for cough, shortness of breath and wheezing.   Cardiovascular: Negative for chest pain, orthopnea, leg swelling and PND.  Gastrointestinal: Positive for abdominal pain and constipation. Negative for diarrhea, heartburn, nausea and vomiting.  Genitourinary: Negative for dysuria and urgency.  Musculoskeletal: Negative for back pain.  Skin: Negative for rash.  Neurological: Negative for dizziness, speech change, focal weakness and headaches.  Endo/Heme/Allergies: Does not bruise/bleed easily.  Psychiatric/Behavioral: Negative for depression.    DRUG ALLERGIES:   Allergies  Allergen Reactions  . Latex Swelling and Other (See Comments)    Reaction:  Facial swelling    VITALS:  Blood pressure (!) 120/47, pulse 89, temperature 98.1 F (36.7 C), temperature source Oral, resp. rate 18, height 5\' 4"  (1.626 m), weight 103.3 kg, SpO2 100 %. PHYSICAL EXAMINATION:  Physical Exam  Constitutional: She is oriented to person, place, and time.  HENT:  Head: Normocephalic and atraumatic.  Eyes: Pupils are equal, round, and reactive to light. Conjunctivae and EOM are normal.  Neck: Normal range of motion. Neck supple. No tracheal deviation present. No thyromegaly present.  Cardiovascular: Normal rate, regular rhythm and normal heart sounds.  Pulmonary/Chest: Effort normal and breath sounds normal. No respiratory distress. She has no wheezes. She exhibits no tenderness.  Abdominal: Soft. Bowel sounds are normal. She exhibits no  distension. There is no tenderness.  Musculoskeletal: Normal range of motion.  Neurological: She is alert and oriented to person, place, and time. No cranial nerve deficit.  Skin: Skin is warm and dry. No rash noted.   LABORATORY PANEL:  Female CBC Recent Labs  Lab 07/25/18 0602  WBC 9.5  HGB 12.4  HCT 39.1  PLT 321   ------------------------------------------------------------------------------------------------------------------ Chemistries  Recent Labs  Lab 07/24/18 1447 07/25/18 0602  NA 136 138  K 3.4* 3.4*  CL 104 102  CO2 23 26  GLUCOSE 94 95  BUN 11 11  CREATININE 0.63 0.81  CALCIUM 8.4* 8.6*  AST 11*  --   ALT 11  --   ALKPHOS 68  --   BILITOT 1.0  --    RADIOLOGY:  Ct Abdomen Pelvis Wo Contrast  Result Date: 07/24/2018 CLINICAL DATA:  Abdominal pain.  Multiple sclerosis.  Paraplegia. EXAM: CT ABDOMEN AND PELVIS WITHOUT CONTRAST TECHNIQUE: Multidetector CT imaging of the abdomen and pelvis was performed following the standard protocol without IV contrast. COMPARISON:  01/21/2017 FINDINGS: Lower chest: Small bilateral pleural effusions and bilateral subsegmental atelectasis. Asymmetric elevation of left hemidiaphragm is noted with overlying passive atelectasis. Hepatobiliary: No focal liver abnormality. Tiny stones within the dependent portion of the gallbladder measure up to 4 mm. No gallbladder wall thickening. No biliary ductal dilatation identified. Pancreas: Unremarkable. No pancreatic ductal dilatation or surrounding inflammatory changes. Spleen: Normal in size without focal abnormality. Adrenals/Urinary Tract: Normal appearance of the adrenal glands. Atrophy of the mid and inferior pole of right kidney noted. Left kidney unremarkable. No kidney stones or hydronephrosis. No suprapubic catheter identified. There is a right lower quadrant ileal diversion loop. Stomach/Bowel: The  stomach appears normal. No abnormal small bowel dilatation identified. Right lower  quadrant parastomal hernia is identified which contains a nonobstructed loop of small bowel. Enteric contrast material is identified within the colon. Large stool burden identified at the level of the distal transverse colon through to the rectum. There is wall thickening involving the rectum which appears increased from previous exam. Vascular/Lymphatic: Aortic atherosclerosis. No aneurysm. Small retroperitoneal lymph nodes identified. No adenopathy within the abdomen or pelvis. Reproductive: Status post hysterectomy.  No adnexal mass. Other: No free fluid or fluid collections. Musculoskeletal: Previous posterior decompression and hardware fixation of the lumbar spine. IMPRESSION: 1. There is a large stool burden predominantly involving the left colon and rectum. Wall thickening of the rectum may reflect proctitis. Stercoral colitis not excluded. 2. 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 No hydronephrosis identified. 3.  Aortic Atherosclerosis (ICD10-I70.0). 4. Small pleural effusions with bibasilar atelectasis. 5. Gallstones. Electronically Signed   By: Signa Kell M.D.   On: 07/24/2018 14:49   Dg Chest 2 View  Result Date: 07/24/2018 CLINICAL DATA:  Chest pain and fever EXAM: CHEST - 2 VIEW COMPARISON:  Chest radiograph April 10, 2017 and chest CT April 11, 2017 FINDINGS: There is bibasilar atelectasis. The lungs elsewhere are clear. Heart is upper normal in size with pulmonary vascularity normal. No adenopathy. No bone lesions. IMPRESSION: Bibasilar atelectasis. Lungs elsewhere clear. Stable cardiac silhouette. Electronically Signed   By: Bretta Bang III M.D.   On: 07/24/2018 11:39   ASSESSMENT AND PLAN:  47 year old female with past medical history of multiple sclerosis, neurogenic bladder status post urostomy, neurogenic bowel, previous history of UTI who presents to the hospital due to abdominal pain nausea and fever and  noted to have a UTI with suspected sepsis.  1.  Sepsis-present on admission  - patient's previous UTI was due to Klebsiella, continue IV ceftriaxone, follow urine and blood cultures.  Follow fever curve, white cell count and hemodynamics.  2.  Urinary tract infection-based off a urinalysis on admission. - Treat patient with IV ceftriaxone, follow urine cultures.  3.  Leukocytosis-secondary to the UTI.  Follow with IV antibiotic therapy.  4.  History of multiple sclerosis- continue baclofen, amantadine.  5.  Neuropathy-continue gabapentin.  6.  Depression-continue Cymbalta.  7.  Constipation-patient received enema earlier this week and responded to it, CT scan still showing some stool burden.   - continue lactulose and add senokot-s as she still reports abd heaviness and constipation     All the records are reviewed and case discussed with Care Management/Social Worker. Management plans discussed with the patient, nursing and they are in agreement.  CODE STATUS: Full Code  TOTAL TIME TAKING CARE OF THIS PATIENT: 35 minutes.   More than 50% of the time was spent in counseling/coordination of care: YES  POSSIBLE D/C IN 1 DAYS, DEPENDING ON CLINICAL CONDITION.   Delfino Lovett M.D on 07/25/2018 at 9:10 AM  Between 7am to 6pm - Pager - [phone]  After 6pm go to [email or web address] - Therapist, nutritional Hospitalists  Office  [phone]  CC: Primary care physician; Center, [facility] Medical  Note: This dictation was prepared with Dragon dictation along with smaller phrase technology. Any transcriptional errors that result from this process are unintentional.

## 2018-07-26 LAB — BASIC METABOLIC PANEL
ANION GAP: 9 (ref 5–15)
BUN: 12 mg/dL (ref 6–20)
CHLORIDE: 107 mmol/L (ref 98–111)
CO2: 24 mmol/L (ref 22–32)
Calcium: 8.8 mg/dL — ABNORMAL LOW (ref 8.9–10.3)
Creatinine, Ser: 0.63 mg/dL (ref 0.44–1.00)
GFR calc non Af Amer: >60 mL/min (ref >60)
Glucose, Bld: 100 mg/dL — ABNORMAL HIGH (ref 70–99)
POTASSIUM: 3.9 mmol/L (ref 3.5–5.1)
Sodium: 140 mmol/L (ref 135–145)

## 2018-07-26 LAB — CBC
HEMATOCRIT: 37.7 % (ref 36.0–46.0)
HEMOGLOBIN: 12.3 g/dL (ref 12.0–15.0)
MCH: 29.5 pg (ref 26.0–34.0)
MCHC: 32.6 g/dL (ref 30.0–36.0)
MCV: 90.4 fL (ref 80.0–100.0)
Platelets: 338 10*3/uL (ref 150–400)
RBC: 4.17 MIL/uL (ref 3.87–5.11)
RDW: 13.3 % (ref 11.5–15.5)
WBC: 6.5 10*3/uL (ref 4.0–10.5)
nRBC: 0 % (ref 0.0–0.2)

## 2018-07-26 LAB — HIV ANTIBODY (ROUTINE TESTING W REFLEX): HIV Screen 4th Generation wRfx: NONREACTIVE

## 2018-07-26 MED ORDER — LACTULOSE 10 GM/15ML PO SOLN: 10.0000 g | Freq: Every day | ORAL | 0 refills | Status: DC | PRN

## 2018-07-26 MED ORDER — POLYETHYLENE GLYCOL 3350 17 G PO PACK: 17.0000 g | PACK | Freq: Every day | ORAL | 0 refills | Status: DC

## 2018-07-26 MED ORDER — CEPHALEXIN 250 MG PO CAPS: 250.0000 mg | ORAL_CAPSULE | Freq: Two times a day (BID) | ORAL | 0 refills | Status: AC

## 2018-07-26 MED ORDER — FLEET ENEMA 7-19 GM/118ML RE ENEM
1.0000 | ENEMA | RECTAL | Status: AC
  Administered 2018-07-26: 08:00:00 1 via RECTAL

## 2018-07-26 NOTE — Care Management Note (Signed)
Case Management Note  Patient Details  Name: [name] MRN: [id number] Date of Birth: 10-Jul-1971  Subjective/Objective:  Admitted to [facility] with the diagnosis of sepsis. Lives with parent.  Goes to [facility] [facility] for primary care services    History of Multiple Sclerosis. Bedbound.   Nursing aides help with care in the home.             Action/Plan: Requested transportation home per [employer] unit. Form placed in discharge placket   Expected Discharge Date:  07/26/18               Expected Discharge Plan:     In-House Referral:   yes  Discharge planning Services   yes  Post Acute Care Choice:    Choice offered to:     DME Arranged:    DME Agency:     HH Arranged:    HH Agency:     Status of Service:     If discussed at [employer], dates discussed:    Additional Comments:  Gwenette Greet, RN MSN CCM Care Management [phone] 07/26/2018, 8:29 AM

## 2018-07-26 NOTE — Discharge Instructions (Signed)

## 2018-07-26 NOTE — Progress Notes (Signed)
Patient discharged home per MD orders. All discharge instructions given and all questions answered. EMS called for transport. 

## 2018-07-27 LAB — URINE CULTURE
Culture: >=100000 — AB
Special Requests: NORMAL

## 2018-07-29 LAB — CULTURE, BLOOD (ROUTINE X 2)
Culture: NO GROWTH
Culture: NO GROWTH
SPECIAL REQUESTS: ADEQUATE

## 2018-07-31 ENCOUNTER — Telehealth: Payer: Self-pay

## 2018-07-31 NOTE — Discharge Summary (Signed)
[employer] [facility] [place] [facility]   PATIENT NAME: Editha Bridgeforth    MR#:  [id number]  DATE OF BIRTH:  1971/02/28  DATE OF ADMISSION:  07/24/2018   ADMITTING PHYSICIAN: Houston Siren, MD  DATE OF DISCHARGE: 07/26/2018  2:09 PM  PRIMARY CARE PHYSICIAN: [facility]   ADMISSION DIAGNOSIS:  Urinary tract infection, acute [N39.0] Sepsis, due to unspecified organism, unspecified whether acute organ dysfunction present (HCC) [A41.9] DISCHARGE DIAGNOSIS:  Active Problems:   Sepsis (HCC)  SECONDARY DIAGNOSIS:   Past Medical History:  Diagnosis Date  . Abdominal wall hernia 09/05/2014  . 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 Edema leg 09/05/2014  . 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 MS (multiple sclerosis) (HCC) 2005  . 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 Neurogenic bowel 08/12/2012  . Neuropathic pain 05/21/2015   HOSPITAL COURSE:  47 year old female with past medical history of multiple sclerosis, neurogenic bladder status post urostomy, neurogenic bowel, previous history of UTI who presents to the hospital due to abdominal pain nausea and fever and noted to have a UTI with suspected sepsis.  1. Sepsis-present on admission - resolved with treatment  2. Klebsiella and E.coliUrinary tract infection-treated  3. Leukocytosis-secondary to the UTI, improving  4. History of multiple sclerosis-continue baclofen, amantadine.  5. Neuropathy-continue gabapentin.  6. Depression-continue Cymbalta.  7. Constipation-resolved DISCHARGE CONDITIONS:  stable CONSULTS OBTAINED:   DRUG ALLERGIES:   Allergies  Allergen Reactions  . Latex Swelling and Other (See Comments)    Reaction:  Facial swelling    DISCHARGE MEDICATIONS:   Allergies as of 07/26/2018      Reactions   Latex Swelling, Other (See Comments)   Reaction:  Facial swelling       Medication List    STOP taking these medications   aspirin 325 MG EC tablet     TAKE these medications   acetaminophen 500 MG tablet Commonly known as:  TYLENOL Take 500 mg by mouth every 6 (six) hours as needed.   amantadine 100 MG capsule Commonly known as:  SYMMETREL Take 100 mg by mouth 2 (two) times daily.   baclofen 20 MG tablet Commonly known as:  LIORESAL Take 20 mg by mouth 2 (two) times daily.   cholecalciferol 1000 units tablet Commonly known as:  VITAMIN D Take 1,000 Units by mouth daily.   docusate sodium 100 MG capsule Commonly known as:  COLACE Take 100 mg by mouth 2 (two) times daily as needed for mild constipation.   DULoxetine 30 MG capsule Commonly known as:  CYMBALTA Take 30 mg by mouth 2 (two) times daily.   gabapentin 800 MG tablet Commonly known as:  NEURONTIN Take 800 mg by mouth 4 (four) times daily.   gabapentin 400 MG capsule Commonly known as:  NEURONTIN Take 2 capsules (800 mg total) by mouth 4 (four) times daily.   lactulose 10 GM/15ML solution Commonly known as:  CHRONULAC Take 15 mLs (10 g total) by mouth daily as needed for mild constipation or severe constipation.   polyethylene glycol packet Commonly known as:  MIRALAX / GLYCOLAX Take 17 g by mouth daily.     ASK your doctor about these medications   cephALEXin 250 MG capsule Commonly known as:  KEFLEX Take 1 capsule (250 mg total) by mouth 2 (two) times daily for 3 days. Ask about: Should I take this medication?  DISCHARGE INSTRUCTIONS:   DIET:  Regular diet DISCHARGE CONDITION:  Good ACTIVITY:  Activity as tolerated OXYGEN:  Home Oxygen: No.  Oxygen Delivery: room air DISCHARGE LOCATION:  home   If you experience worsening of your admission symptoms, develop shortness of breath, life threatening emergency, suicidal or homicidal thoughts you must seek medical attention immediately by calling 911 or calling your MD immediately  if symptoms less  severe.  You Must read complete instructions/literature along with all the possible adverse reactions/side effects for all the Medicines you take and that have been prescribed to you. Take any new Medicines after you have completely understood and accpet all the possible adverse reactions/side effects.   Please note  You were cared for by a hospitalist during your hospital stay. If you have any questions about your discharge medications or the care you received while you were in the hospital after you are discharged, you can call the unit and asked to speak with the hospitalist on call if the hospitalist that took care of you is not available. Once you are discharged, your primary care physician will handle any further medical issues. Please note that NO REFILLS for any discharge medications will be authorized once you are discharged, as it is imperative that you return to your primary care physician (or establish a relationship with a primary care physician if you do not have one) for your aftercare needs so that they can reassess your need for medications and monitor your lab values.    On the day of Discharge:  VITAL SIGNS:  Blood pressure 127/73, pulse 96, temperature 98 F (36.7 C), temperature source Oral, resp. rate 20, height 5\' 4"  (1.626 m), weight 103.3 kg, SpO2 98 %. PHYSICAL EXAMINATION:  GENERAL:  47 y.o.-year-old patient lying in the bed with no acute distress.  EYES: Pupils equal, round, reactive to light and accommodation. No scleral icterus. Extraocular muscles intact.  HEENT: Head atraumatic, normocephalic. Oropharynx and nasopharynx clear.  NECK:  Supple, no jugular venous distention. No thyroid enlargement, no tenderness.  LUNGS: Normal breath sounds bilaterally, no wheezing, rales,rhonchi or crepitation. No use of accessory muscles of respiration.  CARDIOVASCULAR: S1, S2 normal. No murmurs, rubs, or gallops.  ABDOMEN: Soft, non-tender, non-distended. Bowel sounds present. No  organomegaly or mass.  EXTREMITIES: No pedal edema, cyanosis, or clubbing.  NEUROLOGIC: Cranial nerves II through XII are intact. Muscle strength 5/5 in all extremities. Sensation intact. Gait not checked.  PSYCHIATRIC: The patient is alert and oriented x 3.  SKIN: No obvious rash, lesion, or ulcer.  DATA REVIEW:   CBC Recent Labs  Lab 07/26/18 0614  WBC 6.5  HGB 12.3  HCT 37.7  PLT 338    Chemistries  Recent Labs  Lab 07/24/18 1447  07/26/18 0614  NA 136   < > 140  K 3.4*   < > 3.9  CL 104   < > 107  CO2 23   < > 24  GLUCOSE 94   < > 100*  BUN 11   < > 12  CREATININE 0.63   < > 0.63  CALCIUM 8.4*   < > 8.8*  AST 11*  --   --   ALT 11  --   --   ALKPHOS 68  --   --   BILITOT 1.0  --   --    < > = values in this interval not displayed.     Microbiology Results  Urine c/s grew Klebsiella and E.coli  Management plans discussed with the patient, family and they are in agreement.  CODE STATUS: Prior   TOTAL TIME TAKING CARE OF THIS PATIENT: 45 minutes.    Delfino Lovett M.D on 07/31/2018 at 7:13 AM  Between 7am to 6pm - Pager - [phone]  After 6pm go to [email or web address] - Therapist, [facility]  Office  [phone]  CC: Primary care physician; [facility]   Note: This dictation was prepared with Dragon dictation along with smaller phrase technology. Any transcriptional errors that result from this process are unintentional.

## 2018-07-31 NOTE — Telephone Encounter (Signed)
EMMI Follow-up: Noted on the report that the the patient hadn't read her discharge papers yet and responded no to transportation follow-up.  I talked with Ms. [name] and she said hasn't read her paperwork yet and I reminded her there were two follow-up appointments that need to be made and she said she plans to schedule.  We talked about public transportation options and she said they come too early so she will have someone take her in her [name]. No needs noted for today.  I let her know there would be a second automated call with a different series of questions and to let us know if she had any concerns at that time.

## 2018-08-10 MED ORDER — BACLOFEN 20 MG TABLET
ORAL_TABLET | Freq: Three times a day (TID) | ORAL | 3 refills | 0.00000 days | Status: CP
Start: 2018-08-10 — End: 2019-03-03

## 2018-09-05 MED ORDER — AMANTADINE HCL 100 MG CAPSULE
ORAL_CAPSULE | Freq: Two times a day (BID) | ORAL | 3 refills | 0 days | Status: CP
Start: 2018-09-05 — End: 2018-12-13

## 2018-09-05 MED ORDER — DULOXETINE 30 MG CAPSULE,DELAYED RELEASE
ORAL_CAPSULE | Freq: Two times a day (BID) | ORAL | 5 refills | 0 days | Status: CP
Start: 2018-09-05 — End: 2019-02-01

## 2018-10-04 ENCOUNTER — Encounter: Admit: 2018-10-04 | Discharge: 2018-12-02 | Payer: MEDICARE

## 2018-10-04 ENCOUNTER — Encounter
Admit: 2018-10-04 | Discharge: 2018-10-05 | Payer: MEDICARE | Attending: Student in an Organized Health Care Education/Training Program | Primary: Student in an Organized Health Care Education/Training Program

## 2018-10-04 DIAGNOSIS — G35 Multiple sclerosis: Secondary | ICD-10-CM

## 2018-10-04 DIAGNOSIS — E669 Obesity, unspecified: Secondary | ICD-10-CM

## 2018-10-04 DIAGNOSIS — G8220 Paraplegia, unspecified: Secondary | ICD-10-CM

## 2018-10-04 DIAGNOSIS — N3000 Acute cystitis without hematuria: Secondary | ICD-10-CM

## 2018-10-04 DIAGNOSIS — M792 Neuralgia and neuritis, unspecified: Secondary | ICD-10-CM

## 2018-10-04 DIAGNOSIS — Z1231 Encounter for screening mammogram for malignant neoplasm of breast: Principal | ICD-10-CM

## 2018-10-04 DIAGNOSIS — Z9359 Other cystostomy status: Secondary | ICD-10-CM

## 2018-11-21 ENCOUNTER — Ambulatory Visit: Admit: 2018-11-21 | Discharge: 2018-11-22 | Payer: MEDICARE | Attending: Urology | Primary: Urology

## 2018-11-21 DIAGNOSIS — N390 Urinary tract infection, site not specified: Principal | ICD-10-CM

## 2018-11-21 DIAGNOSIS — Z9889 Other specified postprocedural states: Secondary | ICD-10-CM

## 2018-11-26 ENCOUNTER — Encounter: Admit: 2018-11-26 | Discharge: 2018-11-26 | Payer: MEDICARE

## 2018-11-26 DIAGNOSIS — Z90710 Acquired absence of both cervix and uterus: Principal | ICD-10-CM

## 2018-11-26 DIAGNOSIS — Z1231 Encounter for screening mammogram for malignant neoplasm of breast: Principal | ICD-10-CM

## 2018-11-26 DIAGNOSIS — Z9889 Other specified postprocedural states: Principal | ICD-10-CM

## 2018-11-26 DIAGNOSIS — G35 Multiple sclerosis: Principal | ICD-10-CM

## 2018-12-13 MED ORDER — AMANTADINE HCL 100 MG CAPSULE
ORAL_CAPSULE | Freq: Two times a day (BID) | ORAL | 3 refills | 0 days | Status: CP
Start: 2018-12-13 — End: 2019-02-01

## 2019-01-01 DIAGNOSIS — G35 Multiple sclerosis: Principal | ICD-10-CM

## 2019-02-01 ENCOUNTER — Encounter: Admit: 2019-02-01 | Discharge: 2019-02-02 | Payer: MEDICARE

## 2019-02-01 DIAGNOSIS — N390 Urinary tract infection, site not specified: Principal | ICD-10-CM

## 2019-02-01 MED ORDER — CEFDINIR 300 MG CAPSULE
ORAL_CAPSULE | Freq: Two times a day (BID) | ORAL | 0 refills | 0 days | Status: CP
Start: 2019-02-01 — End: 2019-04-26

## 2019-02-01 MED ORDER — AMANTADINE HCL 100 MG CAPSULE
ORAL_CAPSULE | Freq: Two times a day (BID) | ORAL | 3 refills | 0.00000 days | Status: CP
Start: 2019-02-01 — End: 2019-02-22

## 2019-02-01 MED ORDER — DULOXETINE 30 MG CAPSULE,DELAYED RELEASE
ORAL_CAPSULE | Freq: Two times a day (BID) | ORAL | 5 refills | 0 days | Status: CP
Start: 2019-02-01 — End: 2019-03-03

## 2019-02-05 ENCOUNTER — Encounter: Admit: 2019-02-05 | Discharge: 2019-02-06 | Payer: MEDICARE

## 2019-02-05 DIAGNOSIS — N390 Urinary tract infection, site not specified: Principal | ICD-10-CM

## 2019-02-22 MED ORDER — AMANTADINE HCL 100 MG CAPSULE
ORAL_CAPSULE | Freq: Two times a day (BID) | ORAL | 3 refills | 0.00000 days | Status: CP
Start: 2019-02-22 — End: ?

## 2019-02-22 MED ORDER — GABAPENTIN 800 MG TABLET
ORAL_TABLET | Freq: Four times a day (QID) | ORAL | 0 refills | 0 days | Status: CP
Start: 2019-02-22 — End: ?

## 2019-03-04 MED ORDER — DULOXETINE 30 MG CAPSULE,DELAYED RELEASE
ORAL_CAPSULE | Freq: Two times a day (BID) | ORAL | 5 refills | 0.00000 days | Status: CP
Start: 2019-03-04 — End: ?

## 2019-03-04 MED ORDER — BACLOFEN 20 MG TABLET
ORAL_TABLET | Freq: Three times a day (TID) | ORAL | 3 refills | 0.00000 days | Status: CP
Start: 2019-03-04 — End: ?

## 2019-04-20 IMAGING — US US EXTREM  UP VENOUS*L*
1 series · 13 of 24 positions shown · non-contrast
Comparison: None.

CLINICAL DATA: Left upper extremity swelling x1 day



[Series 1: us extrem up venous*left* · 0.08mm/px · 13 of 35 slices shown]
[im 1/35]
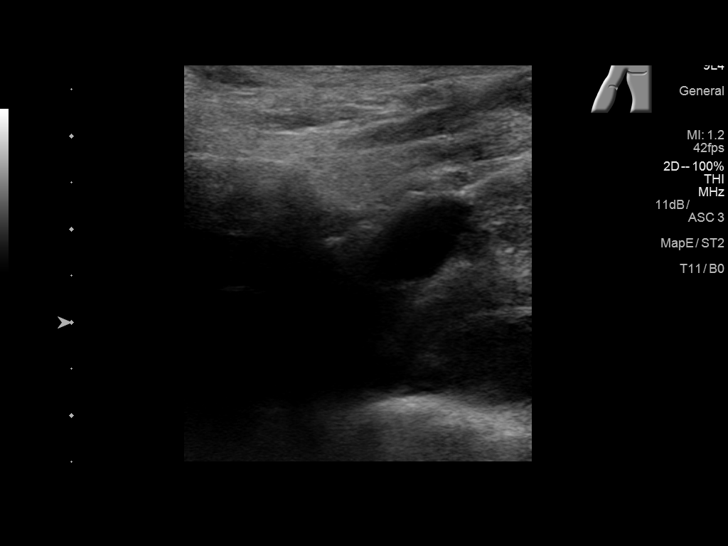
[im 3/35]
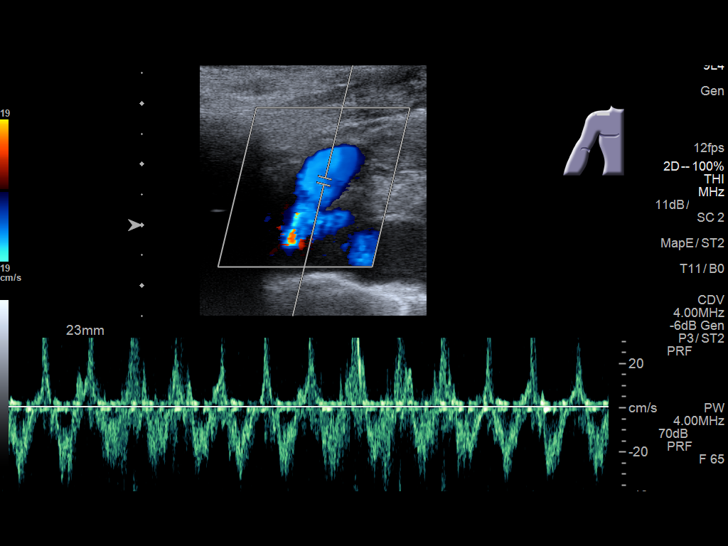
[im 6/35]
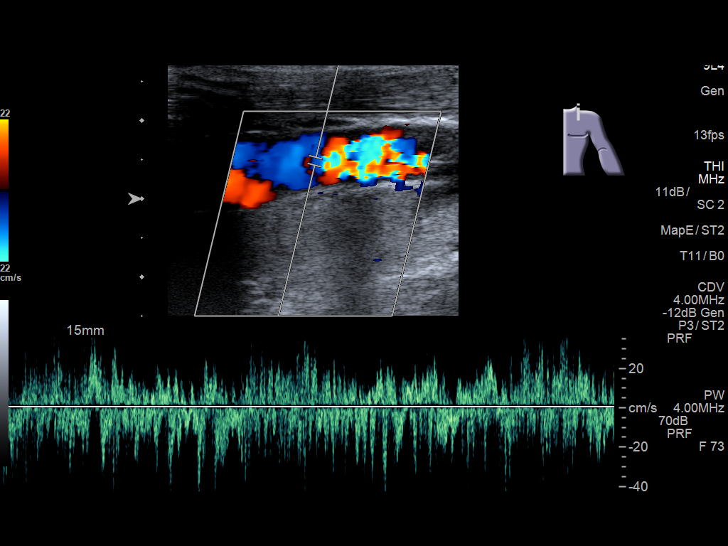
[im 9/35]
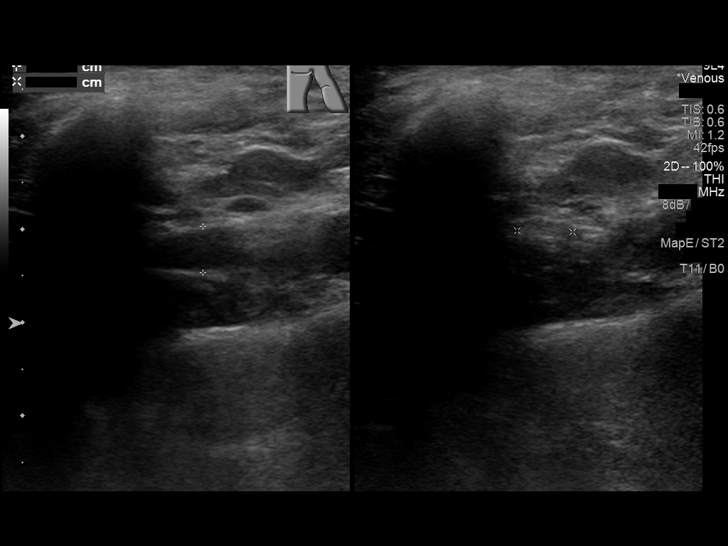
[im 12/35]
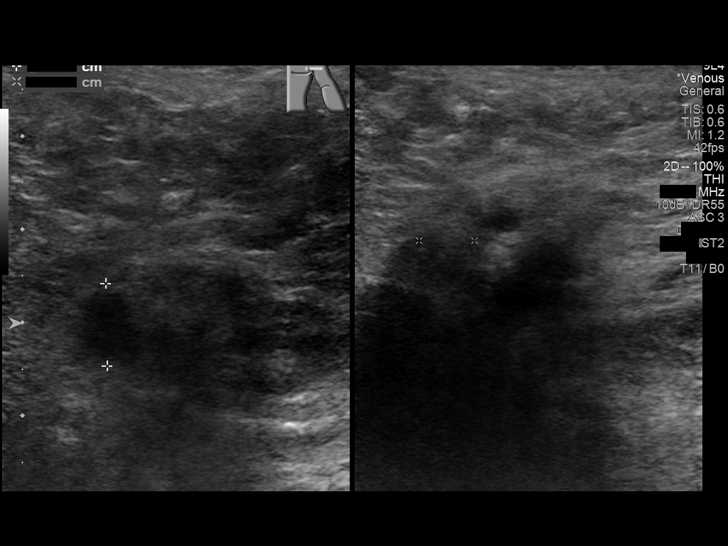
[im 15/35]
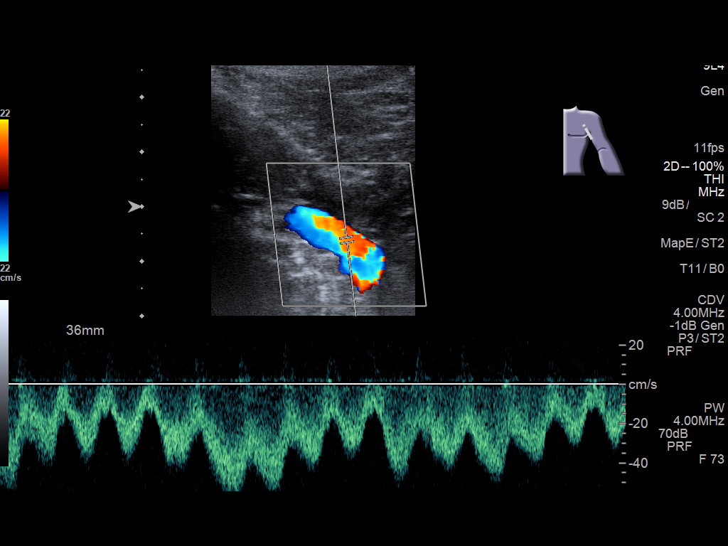
[im 18/35]
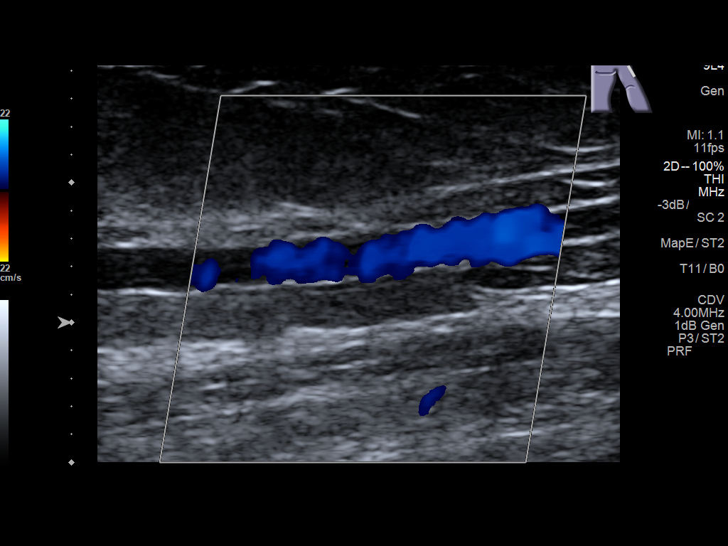
[im 20/35]
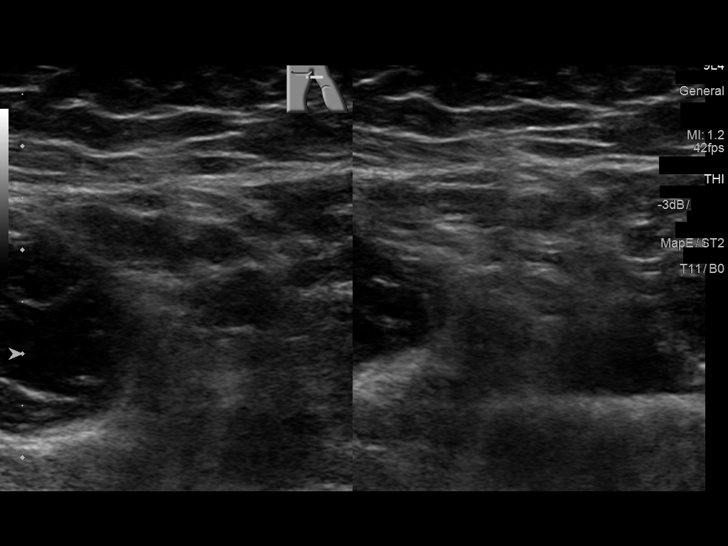
[im 23/35]
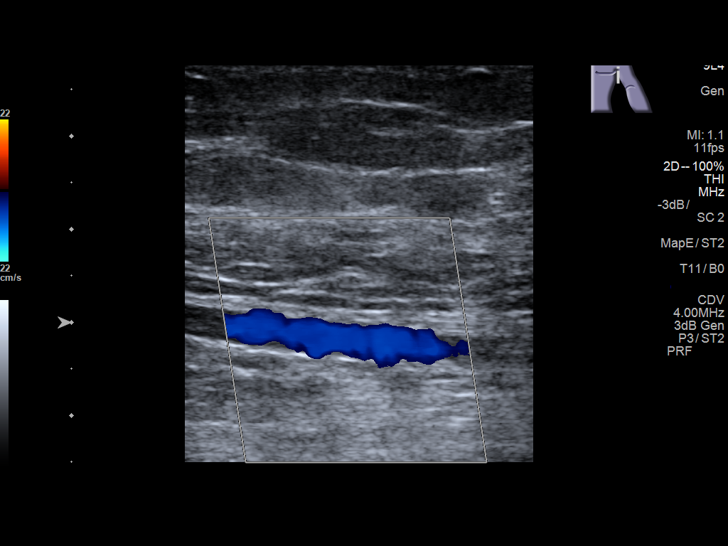
[im 26/35]
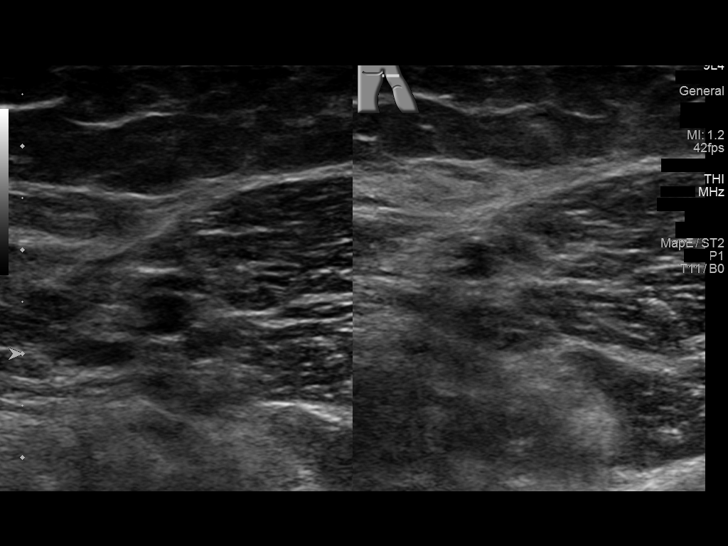
[im 29/35]
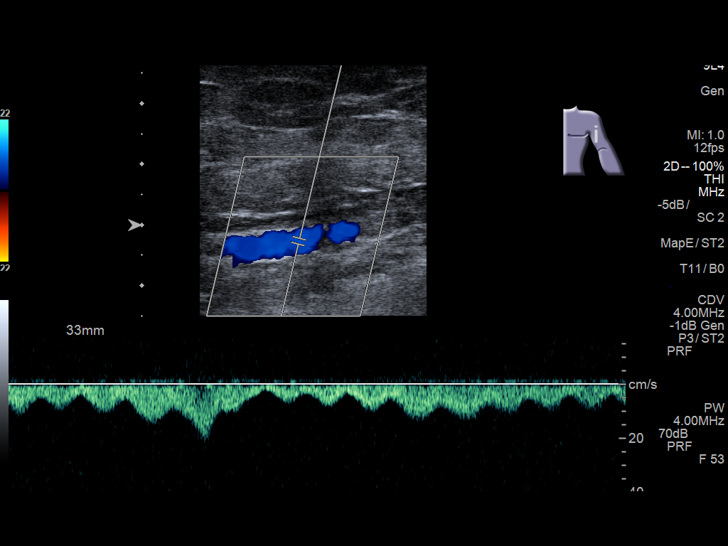
[im 32/35]
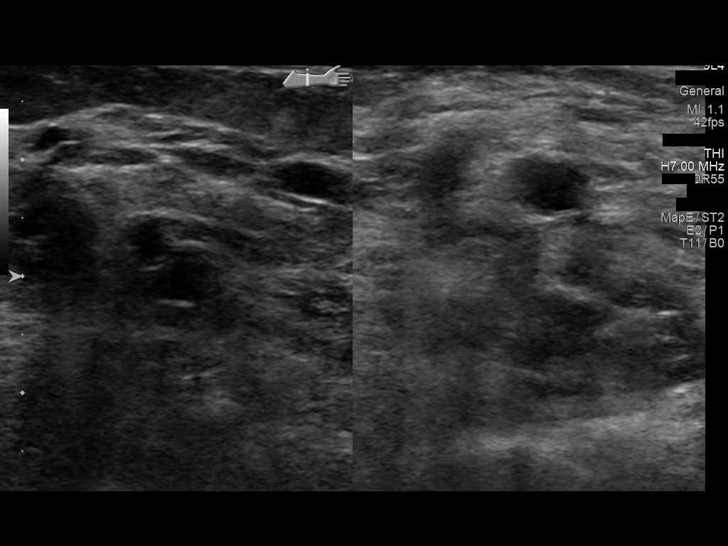
[im 35/35]
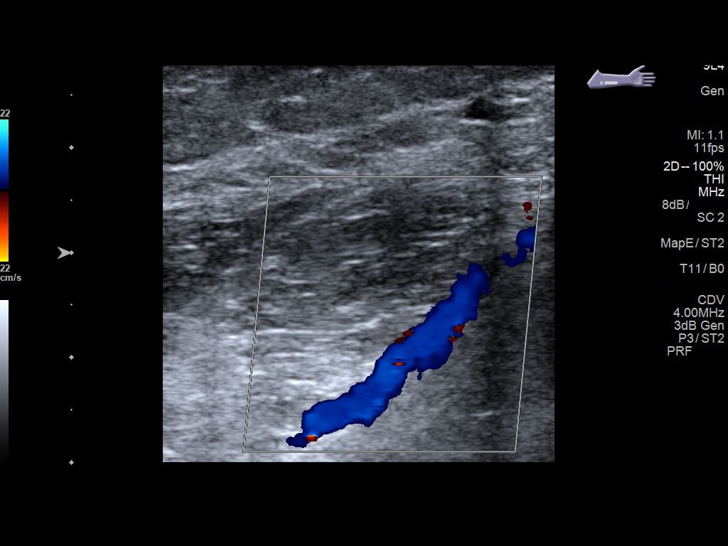

[13 of 24 positions shown; findings below may reference images not displayed]

FINDINGS: Contralateral Subclavian Vein: Respiratory phasicity is normal and
symmetric with the symptomatic side. No evidence of thrombus. Normal
compressibility.

Internal Jugular Vein: No evidence of thrombus. Normal
compressibility, respiratory phasicity and response to augmentation.

Subclavian Vein: No evidence of thrombus. Normal compressibility,
respiratory phasicity and response to augmentation.

Axillary Vein: No evidence of thrombus. Normal compressibility,
respiratory phasicity and response to augmentation.

Cephalic Vein: No evidence of thrombus. Normal compressibility,
respiratory phasicity and response to augmentation.

Basilic Vein: No evidence of thrombus. Normal compressibility,
respiratory phasicity and response to augmentation.

Brachial Veins: No evidence of thrombus. Normal compressibility,
respiratory phasicity and response to augmentation.

Radial Veins: No evidence of thrombus. Normal compressibility,
respiratory phasicity and response to augmentation.

Ulnar Veins: No evidence of thrombus. Normal compressibility,
respiratory phasicity and response to augmentation.

Venous Reflux:  None visualized.

Other Findings:  None visualized.
IMPRESSION: No deep venous thrombosis in the visualized left upper extremity.

## 2019-04-26 ENCOUNTER — Encounter: Admit: 2019-04-26 | Discharge: 2019-04-26 | Payer: MEDICARE

## 2019-04-26 ENCOUNTER — Encounter: Admit: 2019-04-26 | Discharge: 2019-04-26 | Payer: MEDICARE | Attending: Neurology | Primary: Neurology

## 2019-04-26 DIAGNOSIS — G35 Multiple sclerosis: Principal | ICD-10-CM

## 2019-04-26 DIAGNOSIS — G8220 Paraplegia, unspecified: Secondary | ICD-10-CM

## 2019-05-23 ENCOUNTER — Encounter: Admit: 2019-05-23 | Discharge: 2019-05-24 | Payer: MEDICARE

## 2019-05-23 DIAGNOSIS — G35 Multiple sclerosis: Principal | ICD-10-CM

## 2019-06-04 ENCOUNTER — Encounter: Admit: 2019-06-04 | Discharge: 2019-06-05 | Payer: MEDICARE | Attending: Neurology | Primary: Neurology

## 2019-06-04 DIAGNOSIS — G8220 Paraplegia, unspecified: Secondary | ICD-10-CM

## 2019-06-04 DIAGNOSIS — G35 Multiple sclerosis: Secondary | ICD-10-CM

## 2019-06-11 DIAGNOSIS — G35 Multiple sclerosis: Secondary | ICD-10-CM

## 2019-06-11 MED ORDER — GABAPENTIN 800 MG TABLET
ORAL_TABLET | Freq: Four times a day (QID) | ORAL | 2 refills | 90.00000 days | Status: CP
Start: 2019-06-11 — End: ?

## 2019-07-02 ENCOUNTER — Encounter: Admit: 2019-07-02 | Discharge: 2019-07-03 | Payer: MEDICARE

## 2019-07-02 DIAGNOSIS — G35 Multiple sclerosis: Secondary | ICD-10-CM

## 2019-07-17 MED ORDER — AMANTADINE HCL 100 MG CAPSULE: 100 mg | capsule | Freq: Two times a day (BID) | 3 refills | 30 days | Status: AC

## 2019-10-04 DIAGNOSIS — G35 Multiple sclerosis: Principal | ICD-10-CM

## 2019-10-04 MED ORDER — DULOXETINE 30 MG CAPSULE,DELAYED RELEASE
ORAL_CAPSULE | Freq: Two times a day (BID) | ORAL | 0 refills | 90 days | Status: CP
Start: 2019-10-04 — End: ?

## 2019-10-07 ENCOUNTER — Encounter
Admit: 2019-10-07 | Discharge: 2019-11-05 | Payer: MEDICARE | Attending: Rehabilitative and Restorative Service Providers" | Primary: Rehabilitative and Restorative Service Providers"

## 2019-10-07 ENCOUNTER — Ambulatory Visit
Admit: 2019-10-07 | Discharge: 2019-11-05 | Payer: MEDICARE | Attending: Rehabilitative and Restorative Service Providers" | Primary: Rehabilitative and Restorative Service Providers"

## 2019-10-24 ENCOUNTER — Encounter
Admit: 2019-10-24 | Discharge: 2019-10-25 | Payer: MEDICARE | Attending: Student in an Organized Health Care Education/Training Program | Primary: Student in an Organized Health Care Education/Training Program

## 2019-10-25 ENCOUNTER — Ambulatory Visit: Admit: 2019-10-25 | Discharge: 2019-10-26 | Disposition: A | Discharge status: Home / Self Care | Payer: MEDICARE

## 2019-10-25 ENCOUNTER — Encounter: Admit: 2019-10-25 | Discharge: 2019-10-26 | Payer: MEDICARE

## 2019-10-25 ENCOUNTER — Encounter: Admit: 2019-10-25 | Discharge: 2019-10-26 | Disposition: A | Discharge status: Home / Self Care | Payer: MEDICARE

## 2019-10-25 DIAGNOSIS — R198 Other specified symptoms and signs involving the digestive system and abdomen: Principal | ICD-10-CM

## 2019-10-25 DIAGNOSIS — N319 Neuromuscular dysfunction of bladder, unspecified: Principal | ICD-10-CM

## 2019-10-25 MED ORDER — POLYETHYLENE GLYCOL 3350 17 GRAM ORAL POWDER PACKET
PACK | Freq: Every day | ORAL | 0 refills | 30 days | Status: CP
Start: 2019-10-25 — End: 2019-11-24

## 2019-10-25 MED ORDER — SENNOSIDES 8.6 MG-DOCUSATE SODIUM 50 MG TABLET
ORAL_TABLET | Freq: Every day | ORAL | 0 refills | 20 days | Status: CP
Start: 2019-10-25 — End: 2019-11-14

## 2019-10-25 MED ORDER — LEVOFLOXACIN 500 MG TABLET
ORAL_TABLET | Freq: Every day | ORAL | 0 refills | 5.00000 days | Status: CP
Start: 2019-10-25 — End: 2019-10-30

## 2019-10-25 MED ORDER — DOCUSATE SODIUM 100 MG CAPSULE
ORAL_CAPSULE | Freq: Every day | ORAL | 0 refills | 30 days | Status: CP
Start: 2019-10-25 — End: 2019-11-24

## 2019-12-13 ENCOUNTER — Telehealth
Admit: 2019-12-13 | Discharge: 2019-12-14 | Attending: Student in an Organized Health Care Education/Training Program | Primary: Student in an Organized Health Care Education/Training Program

## 2019-12-20 ENCOUNTER — Encounter: Admit: 2019-12-20 | Discharge: 2019-12-21 | Payer: MEDICARE

## 2019-12-20 DIAGNOSIS — G8220 Paraplegia, unspecified: Principal | ICD-10-CM

## 2019-12-20 DIAGNOSIS — G35 Multiple sclerosis: Principal | ICD-10-CM

## 2019-12-20 DIAGNOSIS — R319 Hematuria, unspecified: Principal | ICD-10-CM

## 2019-12-20 DIAGNOSIS — R197 Diarrhea, unspecified: Principal | ICD-10-CM

## 2019-12-23 DIAGNOSIS — A0472 Enterocolitis due to Clostridium difficile, not specified as recurrent: Principal | ICD-10-CM

## 2019-12-23 MED ORDER — VANCOMYCIN 125 MG CAPSULE
ORAL_CAPSULE | Freq: Four times a day (QID) | ORAL | 0 refills | 10.00000 days | Status: CP
Start: 2019-12-23 — End: 2019-12-23

## 2019-12-23 MED ORDER — VANCOMYCIN 125 MG CAPSULE: 125 mg | capsule | Freq: Four times a day (QID) | 0 refills | 10 days | Status: AC

## 2020-01-08 ENCOUNTER — Encounter: Admit: 2020-01-08 | Discharge: 2020-01-09 | Payer: MEDICARE | Attending: Registered" | Primary: Registered"

## 2020-01-08 DIAGNOSIS — Z6841 Body Mass Index (BMI) 40.0 and over, adult: Principal | ICD-10-CM

## 2020-01-08 DIAGNOSIS — E669 Obesity, unspecified: Principal | ICD-10-CM

## 2020-01-20 DIAGNOSIS — Z1231 Encounter for screening mammogram for malignant neoplasm of breast: Principal | ICD-10-CM

## 2020-01-24 DIAGNOSIS — G35 Multiple sclerosis: Principal | ICD-10-CM

## 2020-01-24 MED ORDER — BACLOFEN 20 MG TABLET
ORAL_TABLET | Freq: Three times a day (TID) | ORAL | 3 refills | 60.00000 days | Status: CP
Start: 2020-01-24 — End: ?

## 2020-01-24 MED ORDER — DULOXETINE 30 MG CAPSULE,DELAYED RELEASE
ORAL_CAPSULE | Freq: Two times a day (BID) | ORAL | 0 refills | 90 days | Status: CP
Start: 2020-01-24 — End: ?

## 2020-01-27 DIAGNOSIS — G35 Multiple sclerosis: Principal | ICD-10-CM

## 2020-01-27 MED ORDER — AMANTADINE HCL 100 MG CAPSULE
ORAL_CAPSULE | Freq: Two times a day (BID) | ORAL | 2 refills | 90 days | Status: CP
Start: 2020-01-27 — End: ?

## 2020-02-12 ENCOUNTER — Encounter: Admit: 2020-02-12 | Discharge: 2020-02-13 | Payer: MEDICARE | Attending: Registered" | Primary: Registered"

## 2020-02-12 ENCOUNTER — Telehealth
Admit: 2020-02-12 | Discharge: 2020-02-13 | Payer: MEDICARE | Attending: Physician Assistant | Primary: Physician Assistant

## 2020-02-12 ENCOUNTER — Encounter: Admit: 2020-02-12 | Discharge: 2020-02-13 | Payer: MEDICARE

## 2020-02-12 DIAGNOSIS — G35 Multiple sclerosis: Principal | ICD-10-CM

## 2020-02-12 MED ORDER — DULOXETINE 30 MG CAPSULE,DELAYED RELEASE
ORAL_CAPSULE | Freq: Every day | ORAL | 1 refills | 90 days | Status: CP
Start: 2020-02-12 — End: ?

## 2020-02-19 ENCOUNTER — Ambulatory Visit: Admit: 2020-02-19 | Discharge: 2020-02-20 | Payer: MEDICARE | Attending: Urology | Primary: Urology

## 2020-03-04 ENCOUNTER — Ambulatory Visit
Admit: 2020-03-04 | Discharge: 2020-03-05 | Payer: MEDICARE | Attending: Student in an Organized Health Care Education/Training Program | Primary: Student in an Organized Health Care Education/Training Program

## 2020-03-04 DIAGNOSIS — G35 Multiple sclerosis: Principal | ICD-10-CM

## 2020-03-18 ENCOUNTER — Ambulatory Visit
Admit: 2020-03-18 | Discharge: 2020-03-18 | Payer: MEDICARE | Attending: Student in an Organized Health Care Education/Training Program | Primary: Student in an Organized Health Care Education/Training Program

## 2020-03-18 ENCOUNTER — Telehealth: Admit: 2020-03-18 | Discharge: 2020-03-18 | Payer: MEDICARE

## 2020-03-18 DIAGNOSIS — G4489 Other headache syndrome: Principal | ICD-10-CM

## 2020-03-19 ENCOUNTER — Encounter: Admit: 2020-03-19 | Payer: MEDICARE

## 2020-03-20 DIAGNOSIS — G35 Multiple sclerosis: Principal | ICD-10-CM

## 2020-03-20 MED ORDER — GABAPENTIN 800 MG TABLET
ORAL_TABLET | Freq: Four times a day (QID) | ORAL | 0 refills | 90.00000 days | Status: CP
Start: 2020-03-20 — End: ?

## 2020-03-24 ENCOUNTER — Encounter: Admit: 2020-03-24 | Discharge: 2020-03-25 | Payer: MEDICARE

## 2020-03-26 ENCOUNTER — Encounter
Admit: 2020-03-26 | Discharge: 2020-03-27 | Payer: MEDICARE | Attending: Physician Assistant | Primary: Physician Assistant

## 2020-03-26 DIAGNOSIS — G35 Multiple sclerosis: Principal | ICD-10-CM

## 2020-04-09 ENCOUNTER — Ambulatory Visit: Admit: 2020-04-09 | Discharge: 2020-04-10 | Payer: MEDICARE

## 2020-04-09 ENCOUNTER — Ambulatory Visit
Admit: 2020-04-09 | Discharge: 2020-04-10 | Payer: MEDICARE | Attending: Student in an Organized Health Care Education/Training Program | Primary: Student in an Organized Health Care Education/Training Program

## 2020-04-09 DIAGNOSIS — R5383 Other fatigue: Principal | ICD-10-CM

## 2020-04-09 DIAGNOSIS — K592 Neurogenic bowel, not elsewhere classified: Principal | ICD-10-CM

## 2020-04-09 DIAGNOSIS — R799 Abnormal finding of blood chemistry, unspecified: Principal | ICD-10-CM

## 2020-04-09 DIAGNOSIS — G8220 Paraplegia, unspecified: Principal | ICD-10-CM

## 2020-04-09 DIAGNOSIS — M792 Neuralgia and neuritis, unspecified: Principal | ICD-10-CM

## 2020-04-09 DIAGNOSIS — G35 Multiple sclerosis: Principal | ICD-10-CM

## 2020-04-09 DIAGNOSIS — E669 Obesity, unspecified: Principal | ICD-10-CM

## 2020-04-09 DIAGNOSIS — D229 Melanocytic nevi, unspecified: Principal | ICD-10-CM

## 2020-04-09 DIAGNOSIS — R600 Localized edema: Principal | ICD-10-CM

## 2020-04-09 DIAGNOSIS — G4489 Other headache syndrome: Principal | ICD-10-CM

## 2020-04-09 DIAGNOSIS — Z1211 Encounter for screening for malignant neoplasm of colon: Principal | ICD-10-CM

## 2020-04-09 MED ORDER — BACLOFEN 20 MG TABLET
ORAL_TABLET | Freq: Three times a day (TID) | ORAL | 3 refills | 60.00000 days | Status: CP
Start: 2020-04-09 — End: ?

## 2020-04-10 ENCOUNTER — Telehealth
Admit: 2020-04-10 | Discharge: 2020-04-11 | Payer: MEDICARE | Attending: Physician Assistant | Primary: Physician Assistant

## 2020-04-15 ENCOUNTER — Encounter: Admit: 2020-04-15 | Discharge: 2020-04-16 | Payer: MEDICARE

## 2020-04-15 ENCOUNTER — Ambulatory Visit: Admit: 2020-04-15 | Discharge: 2020-04-16 | Payer: MEDICARE

## 2020-04-15 DIAGNOSIS — R197 Diarrhea, unspecified: Principal | ICD-10-CM

## 2020-04-15 MED ORDER — POLYETHYLENE GLYCOL 3350 17 GRAM ORAL POWDER PACKET
PACK | Freq: Every day | ORAL | 0 refills | 90.00000 days | Status: CP
Start: 2020-04-15 — End: ?

## 2020-04-15 MED ORDER — SENNOSIDES 8.6 MG-DOCUSATE SODIUM 50 MG TABLET
ORAL_TABLET | Freq: Every evening | ORAL | 1 refills | 15 days | Status: CP
Start: 2020-04-15 — End: 2021-04-15

## 2020-04-16 MED ORDER — LACTULOSE 10 GRAM/15 ML ORAL SOLUTION
Freq: Every day | ORAL | 3 refills | 16.00000 days | Status: CP
Start: 2020-04-16 — End: ?

## 2020-04-20 ENCOUNTER — Ambulatory Visit: Admit: 2020-04-20 | Discharge: 2020-04-21 | Payer: MEDICARE

## 2020-04-20 DIAGNOSIS — R197 Diarrhea, unspecified: Principal | ICD-10-CM

## 2020-05-15 ENCOUNTER — Ambulatory Visit
Admit: 2020-05-15 | Discharge: 2020-05-16 | Payer: MEDICARE | Attending: Student in an Organized Health Care Education/Training Program | Primary: Student in an Organized Health Care Education/Training Program

## 2020-05-15 DIAGNOSIS — G35 Multiple sclerosis: Principal | ICD-10-CM

## 2020-05-15 DIAGNOSIS — G8220 Paraplegia, unspecified: Principal | ICD-10-CM

## 2020-05-15 DIAGNOSIS — R197 Diarrhea, unspecified: Principal | ICD-10-CM

## 2020-05-15 DIAGNOSIS — R600 Localized edema: Principal | ICD-10-CM

## 2020-05-15 DIAGNOSIS — M792 Neuralgia and neuritis, unspecified: Principal | ICD-10-CM

## 2020-05-15 DIAGNOSIS — Z9889 Other specified postprocedural states: Principal | ICD-10-CM

## 2020-05-15 DIAGNOSIS — R5383 Other fatigue: Principal | ICD-10-CM

## 2020-06-24 ENCOUNTER — Encounter: Admit: 2020-06-24 | Discharge: 2020-06-25 | Payer: MEDICARE | Attending: Dermatology | Primary: Dermatology

## 2020-06-24 DIAGNOSIS — D229 Melanocytic nevi, unspecified: Principal | ICD-10-CM

## 2020-07-10 DIAGNOSIS — G35 Multiple sclerosis: Principal | ICD-10-CM

## 2020-07-10 MED ORDER — GABAPENTIN 800 MG TABLET
ORAL_TABLET | Freq: Four times a day (QID) | ORAL | 1 refills | 90.00000 days | Status: CP
Start: 2020-07-10 — End: ?

## 2020-07-16 ENCOUNTER — Ambulatory Visit
Admit: 2020-07-16 | Discharge: 2020-07-17 | Payer: MEDICARE | Attending: Student in an Organized Health Care Education/Training Program | Primary: Student in an Organized Health Care Education/Training Program

## 2020-07-16 DIAGNOSIS — L720 Epidermal cyst: Principal | ICD-10-CM

## 2020-08-14 ENCOUNTER — Encounter
Admit: 2020-08-14 | Discharge: 2020-08-15 | Payer: MEDICARE | Attending: Student in an Organized Health Care Education/Training Program | Primary: Student in an Organized Health Care Education/Training Program

## 2020-08-14 DIAGNOSIS — R600 Localized edema: Principal | ICD-10-CM

## 2020-08-14 DIAGNOSIS — G35 Multiple sclerosis: Principal | ICD-10-CM

## 2020-08-14 DIAGNOSIS — E669 Obesity, unspecified: Principal | ICD-10-CM

## 2020-08-14 DIAGNOSIS — M792 Neuralgia and neuritis, unspecified: Principal | ICD-10-CM

## 2020-08-14 DIAGNOSIS — G8220 Paraplegia, unspecified: Principal | ICD-10-CM

## 2020-08-14 MED ORDER — LIRAGLUTIDE 0.6 MG/0.1 ML (18 MG/3 ML) SUBCUTANEOUS PEN INJECTOR
SUBCUTANEOUS | 0 refills | 0.00000 days | Status: CP
Start: 2020-08-14 — End: 2021-09-11

## 2020-08-25 DIAGNOSIS — E669 Obesity, unspecified: Principal | ICD-10-CM

## 2020-08-31 MED ORDER — LIRAGLUTIDE 0.6 MG/0.1 ML (18 MG/3 ML) SUBCUTANEOUS PEN INJECTOR
SUBCUTANEOUS | 0 refills | 0.00000 days | Status: CP
Start: 2020-08-31 — End: 2021-09-14

## 2020-09-10 ENCOUNTER — Ambulatory Visit
Admit: 2020-09-10 | Discharge: 2020-09-11 | Payer: MEDICARE | Attending: Student in an Organized Health Care Education/Training Program | Primary: Student in an Organized Health Care Education/Training Program

## 2020-09-10 DIAGNOSIS — R600 Localized edema: Principal | ICD-10-CM

## 2020-09-10 DIAGNOSIS — N951 Menopausal and female climacteric states: Principal | ICD-10-CM

## 2020-09-10 DIAGNOSIS — G35 Multiple sclerosis: Principal | ICD-10-CM

## 2020-09-10 DIAGNOSIS — Z9889 Other specified postprocedural states: Principal | ICD-10-CM

## 2020-09-10 DIAGNOSIS — K592 Neurogenic bowel, not elsewhere classified: Principal | ICD-10-CM

## 2020-09-10 DIAGNOSIS — E669 Obesity, unspecified: Principal | ICD-10-CM

## 2020-09-10 DIAGNOSIS — G8220 Paraplegia, unspecified: Principal | ICD-10-CM

## 2020-09-10 DIAGNOSIS — M792 Neuralgia and neuritis, unspecified: Principal | ICD-10-CM

## 2020-09-10 MED ORDER — GABAPENTIN 800 MG TABLET
ORAL_TABLET | Freq: Three times a day (TID) | ORAL | 1 refills | 120 days | Status: CP
Start: 2020-09-10 — End: ?

## 2020-10-02 DIAGNOSIS — G35 Multiple sclerosis: Principal | ICD-10-CM

## 2020-10-02 MED ORDER — DULOXETINE 30 MG CAPSULE,DELAYED RELEASE
ORAL_CAPSULE | Freq: Every day | ORAL | 1 refills | 90.00000 days | Status: CP
Start: 2020-10-02 — End: ?

## 2020-10-14 DIAGNOSIS — Z1231 Encounter for screening mammogram for malignant neoplasm of breast: Principal | ICD-10-CM

## 2020-10-30 MED ORDER — AMANTADINE HCL 100 MG CAPSULE
ORAL_CAPSULE | Freq: Two times a day (BID) | ORAL | 3 refills | 30.00000 days | Status: CP
Start: 2020-10-30 — End: ?

## 2020-11-02 ENCOUNTER — Telehealth
Admit: 2020-11-02 | Discharge: 2020-11-03 | Payer: MEDICARE | Attending: Physician Assistant | Primary: Physician Assistant

## 2020-11-02 DIAGNOSIS — G35 Multiple sclerosis: Principal | ICD-10-CM

## 2020-11-07 ENCOUNTER — Other Ambulatory Visit: Payer: Self-pay

## 2020-11-07 ENCOUNTER — Inpatient Hospital Stay
Admission: EM | Admit: 2020-11-07 | Discharge: 2020-11-10 | DRG: 698 | Disposition: A | Discharge status: Home Health | Payer: Medicare Other | Attending: Internal Medicine | Admitting: Internal Medicine

## 2020-11-07 ENCOUNTER — Encounter: Payer: Self-pay | Admitting: Internal Medicine

## 2020-11-07 DIAGNOSIS — N390 Urinary tract infection, site not specified: Secondary | ICD-10-CM | POA: Diagnosis not present

## 2020-11-07 DIAGNOSIS — Z87440 Personal history of urinary (tract) infections: Secondary | ICD-10-CM

## 2020-11-07 DIAGNOSIS — T83518A Infection and inflammatory reaction due to other urinary catheter, initial encounter: Principal | ICD-10-CM | POA: Diagnosis present

## 2020-11-07 DIAGNOSIS — Z993 Dependence on wheelchair: Secondary | ICD-10-CM

## 2020-11-07 DIAGNOSIS — Z20822 Contact with and (suspected) exposure to covid-19: Secondary | ICD-10-CM | POA: Diagnosis present

## 2020-11-07 DIAGNOSIS — G8220 Paraplegia, unspecified: Secondary | ICD-10-CM | POA: Diagnosis present

## 2020-11-07 DIAGNOSIS — R531 Weakness: Secondary | ICD-10-CM | POA: Diagnosis not present

## 2020-11-07 DIAGNOSIS — Z91040 Latex allergy status: Secondary | ICD-10-CM

## 2020-11-07 DIAGNOSIS — K592 Neurogenic bowel, not elsewhere classified: Secondary | ICD-10-CM | POA: Diagnosis present

## 2020-11-07 DIAGNOSIS — Y846 Urinary catheterization as the cause of abnormal reaction of the patient, or of later complication, without mention of misadventure at the time of the procedure: Secondary | ICD-10-CM | POA: Diagnosis present

## 2020-11-07 DIAGNOSIS — G35 Multiple sclerosis: Secondary | ICD-10-CM | POA: Diagnosis present

## 2020-11-07 DIAGNOSIS — Z90710 Acquired absence of both cervix and uterus: Secondary | ICD-10-CM

## 2020-11-07 DIAGNOSIS — N319 Neuromuscular dysfunction of bladder, unspecified: Secondary | ICD-10-CM | POA: Diagnosis present

## 2020-11-07 DIAGNOSIS — E872 Acidosis: Secondary | ICD-10-CM | POA: Diagnosis present

## 2020-11-07 DIAGNOSIS — N3000 Acute cystitis without hematuria: Secondary | ICD-10-CM | POA: Diagnosis present

## 2020-11-07 DIAGNOSIS — M792 Neuralgia and neuritis, unspecified: Secondary | ICD-10-CM | POA: Diagnosis present

## 2020-11-07 DIAGNOSIS — A419 Sepsis, unspecified organism: Secondary | ICD-10-CM | POA: Diagnosis present

## 2020-11-07 DIAGNOSIS — Z7401 Bed confinement status: Secondary | ICD-10-CM

## 2020-11-07 DIAGNOSIS — Z79899 Other long term (current) drug therapy: Secondary | ICD-10-CM

## 2020-11-07 LAB — URINALYSIS, COMPLETE (UACMP) WITH MICROSCOPIC
Bilirubin Urine: NEGATIVE
Glucose, UA: NEGATIVE mg/dL
Ketones, ur: 20 mg/dL — AB
Nitrite: NEGATIVE
Protein, ur: 100 mg/dL — AB
Specific Gravity, Urine: 1.01 (ref 1.005–1.030)
Squamous Epithelial / LPF: NONE SEEN (ref 0–5)
WBC, UA: >50 WBC/hpf — ABNORMAL HIGH (ref 0–5)
pH: 9 — ABNORMAL HIGH (ref 5.0–8.0)

## 2020-11-07 LAB — COMPREHENSIVE METABOLIC PANEL
ALT: 13 U/L (ref 0–44)
AST: 17 U/L (ref 15–41)
Albumin: 4 g/dL (ref 3.5–5.0)
Alkaline Phosphatase: 56 U/L (ref 38–126)
Anion gap: 16 — ABNORMAL HIGH (ref 5–15)
BUN: 12 mg/dL (ref 6–20)
CO2: 19 mmol/L — ABNORMAL LOW (ref 22–32)
Calcium: 9.1 mg/dL (ref 8.9–10.3)
Chloride: 103 mmol/L (ref 98–111)
Creatinine, Ser: 0.49 mg/dL (ref 0.44–1.00)
GFR, Estimated: >60 mL/min (ref >60)
Glucose, Bld: 73 mg/dL (ref 70–99)
Potassium: 3.7 mmol/L (ref 3.5–5.1)
Sodium: 138 mmol/L (ref 135–145)
Total Bilirubin: 0.8 mg/dL (ref 0.3–1.2)
Total Protein: 8.5 g/dL — ABNORMAL HIGH (ref 6.5–8.1)

## 2020-11-07 LAB — CBC
HCT: 40.5 % (ref 36.0–46.0)
Hemoglobin: 13.4 g/dL (ref 12.0–15.0)
MCH: 29.6 pg (ref 26.0–34.0)
MCHC: 33.1 g/dL (ref 30.0–36.0)
MCV: 89.4 fL (ref 80.0–100.0)
Platelets: 278 10*3/uL (ref 150–400)
RBC: 4.53 MIL/uL (ref 3.87–5.11)
RDW: 13.8 % (ref 11.5–15.5)
WBC: 18.1 10*3/uL — ABNORMAL HIGH (ref 4.0–10.5)
nRBC: 0 % (ref 0.0–0.2)

## 2020-11-07 LAB — TROPONIN I (HIGH SENSITIVITY): Troponin I (High Sensitivity): 4 ng/L (ref <18)

## 2020-11-07 LAB — RESP PANEL BY RT-PCR (FLU A&B, COVID) ARPGX2
Influenza A by PCR: NEGATIVE
Influenza B by PCR: NEGATIVE
SARS Coronavirus 2 by RT PCR: NEGATIVE

## 2020-11-07 MED ORDER — AMANTADINE HCL 100 MG PO CAPS
100.0000 mg | ORAL_CAPSULE | Freq: Two times a day (BID) | ORAL | Status: DC
  Administered 2020-11-08 – 2020-11-10 (×6): 100 mg via ORAL
  Filled 2020-11-07 (×8): qty 1

## 2020-11-07 MED ORDER — SODIUM CHLORIDE 0.9 % IV SOLN
1.0000 g | Freq: Once | INTRAVENOUS | Status: AC
  Administered 2020-11-07: 1 g via INTRAVENOUS
  Filled 2020-11-07: qty 10

## 2020-11-07 MED ORDER — DULOXETINE HCL 30 MG PO CPEP
30.0000 mg | ORAL_CAPSULE | Freq: Two times a day (BID) | ORAL | Status: DC
  Administered 2020-11-08 – 2020-11-10 (×4): 30 mg via ORAL
  Filled 2020-11-07 (×7): qty 1

## 2020-11-07 MED ORDER — ONDANSETRON HCL 4 MG/2ML IJ SOLN
INTRAMUSCULAR | Status: AC
  Administered 2020-11-07: 4 mg via INTRAVENOUS
  Filled 2020-11-07: qty 2

## 2020-11-07 MED ORDER — VITAMIN D 25 MCG (1000 UNIT) PO TABS
1000.0000 [IU] | ORAL_TABLET | Freq: Every day | ORAL | Status: DC
  Administered 2020-11-08 – 2020-11-10 (×3): 1000 [IU] via ORAL
  Filled 2020-11-07 (×3): qty 1

## 2020-11-07 MED ORDER — GABAPENTIN 800 MG PO TABS
800.0000 mg | ORAL_TABLET | Freq: Three times a day (TID) | ORAL | Status: DC
  Filled 2020-11-07 (×2): qty 1

## 2020-11-07 MED ORDER — ENOXAPARIN SODIUM 40 MG/0.4ML ~~LOC~~ SOLN: 40.0000 mg | SUBCUTANEOUS | Status: DC

## 2020-11-07 MED ORDER — POLYETHYLENE GLYCOL 3350 17 G PO PACK
17.0000 g | PACK | Freq: Every day | ORAL | Status: DC
  Administered 2020-11-08 – 2020-11-10 (×3): 17 g via ORAL
  Filled 2020-11-07 (×3): qty 1

## 2020-11-07 MED ORDER — ONDANSETRON HCL 4 MG/2ML IJ SOLN: 4.0000 mg | Freq: Four times a day (QID) | INTRAMUSCULAR | Status: DC | PRN

## 2020-11-07 MED ORDER — LACTULOSE 10 GM/15ML PO SOLN: 10.0000 g | Freq: Every day | ORAL | Status: DC | PRN

## 2020-11-07 MED ORDER — ACETAMINOPHEN 500 MG PO TABS
500.0000 mg | ORAL_TABLET | Freq: Four times a day (QID) | ORAL | Status: DC | PRN
  Administered 2020-11-09 (×2): 500 mg via ORAL
  Filled 2020-11-07 (×2): qty 1

## 2020-11-07 MED ORDER — ONDANSETRON HCL 4 MG/2ML IJ SOLN
4.0000 mg | Freq: Once | INTRAMUSCULAR | Status: AC
  Administered 2020-11-07: 4 mg via INTRAVENOUS
  Filled 2020-11-07: qty 2

## 2020-11-07 MED ORDER — TRAZODONE HCL 50 MG PO TABS: 25.0000 mg | ORAL_TABLET | Freq: Every evening | ORAL | Status: DC | PRN

## 2020-11-07 MED ORDER — BACLOFEN 10 MG PO TABS
20.0000 mg | ORAL_TABLET | Freq: Two times a day (BID) | ORAL | Status: DC
  Administered 2020-11-08 – 2020-11-10 (×6): 20 mg via ORAL
  Filled 2020-11-07 (×9): qty 2

## 2020-11-07 MED ORDER — SODIUM CHLORIDE 0.9 % IV BOLUS
1000.0000 mL | Freq: Once | INTRAVENOUS | Status: AC
  Administered 2020-11-07: 1000 mL via INTRAVENOUS

## 2020-11-07 MED ORDER — DOCUSATE SODIUM 100 MG PO CAPS: 100.0000 mg | ORAL_CAPSULE | Freq: Two times a day (BID) | ORAL | Status: DC | PRN

## 2020-11-07 MED ORDER — SODIUM CHLORIDE 0.45 % IV SOLN: INTRAVENOUS | Status: DC

## 2020-11-07 NOTE — ED Notes (Signed)
Pharmacy contacted requesting verification of medications for RN to administer.  

## 2020-11-07 NOTE — ED Provider Notes (Signed)
[facility] Emergency Department Provider Note  Time seen: 6:55 PM  I have reviewed the triage vital signs and the nursing notes.   HISTORY  Chief Complaint Weakness  HPI [name] is a 50 y.o. female with a past medical history of multiple sclerosis, bed and wheelchair bound at baseline, neurogenic bladder with urostomy, history of urinary tract infections, presents to the emergency department for generalized fatigue and weakness.  According to the patient around 5 PM today she developed generalized weakness mostly noted in bilateral upper extremities.  Patient states a history of MS and has significant lower extremity weakness at baseline cannot ambulate.  States at 5 PM had onset of diffuse weakness throughout her body but most notably in the bilateral upper extremities.  States she has been nauseated for the past 2 days with intermittent episodes of vomiting.  No known fever cough or shortness of breath.  No chest pain or abdominal pain.   Past Medical History:  Diagnosis Date  . Abdominal wall hernia 09/05/2014  . 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 Edema leg 09/05/2014  . 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 MS (multiple sclerosis) (HCC) 2005  . 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 Neurogenic bowel 08/12/2012  . Neuropathic pain 05/21/2015    Patient Active Problem List   Diagnosis Date Noted  . Altered mental status 04/10/2017  . Sepsis (HCC) 01/12/2016  . Complicated UTI (urinary tract infection) 08/09/2015  . Exacerbation of multiple sclerosis (HCC) 08/09/2015  . 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 Multiple sclerosis (HCC)   . Neuropathic pain 05/21/2015  . 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 History of spinal surgery 04/29/2015  . Edema leg 09/05/2014  . Adiposity 09/05/2014  . Abdominal wall hernia 09/05/2014  . Multiple  sclerosis, 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  . Neurogenic bowel 08/12/2012  . Infection of urinary tract 04/07/2006  . 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 ABDOMINAL HYSTERECTOMY  2001  . BACK SURGERY  2004  . REVISION UROSTOMY CUTANEOUS  06/2012    Prior to Admission medications   Medication Sig Start Date End Date Taking? Authorizing Provider  acetaminophen (TYLENOL) 500 MG tablet Take 500 mg by mouth every 6 (six) hours as needed.    [provider]  amantadine (SYMMETREL) 100 MG capsule Take 100 mg by mouth 2 (two) times daily.    [provider]  baclofen (LIORESAL) 20 MG tablet Take 20 mg by mouth 2 (two) times daily.     [provider]  cholecalciferol (VITAMIN D) 1000 units tablet Take 1,000 Units by mouth daily.    [provider]  docusate sodium (COLACE) 100 MG capsule Take 100 mg by mouth 2 (two) times daily as needed for mild constipation.    [provider]  DULoxetine (CYMBALTA) 30 MG capsule Take 30 mg by mouth 2 (two) times daily.    [provider]  gabapentin (NEURONTIN) 400 MG capsule Take 2 capsules (800 mg total) by mouth 4 (four) times daily. Patient not taking: Reported on 07/24/2018 01/24/17   Alford Highland, MD  gabapentin (NEURONTIN) 800 MG tablet Take 800 mg by mouth 4 (four) times daily.    [provider]  lactulose (CHRONULAC) 10 GM/15ML solution Take 15 mLs (10 g total) by mouth daily as needed for mild constipation or severe constipation. 07/26/18  Delfino Lovett, MD  polyethylene glycol (MIRALAX / GLYCOLAX) packet Take 17 g by mouth daily. 07/26/18   Delfino Lovett, MD    Allergies  Allergen Reactions  . Latex Swelling and Other (See Comments)    Reaction:  Facial swelling     Family History  Problem Relation Age of Onset  . Osteoarthritis Mother   . Hypertension Mother   . Hyperlipidemia Mother   . Thyroid disease Mother   . Liver  cancer Father   . Asthma Brother     Social History Social History   Tobacco Use  . Smoking status: Never Smoker  . Smokeless tobacco: Never Used  Substance Use Topics  . Alcohol use: Yes    Alcohol/week: 0.0 standard drinks    Comment: socially  . Drug use: No    Review of Systems Constitutional: Negative for fever.  Positive for generalized weakness. Cardiovascular: Negative for chest pain. Respiratory: Negative for shortness of breath. Gastrointestinal: Negative for abdominal pain.  Nausea vomiting x2 days.  No diarrhea. Genitourinary: Urostomy. Musculoskeletal: Negative for musculoskeletal complaints Neurological: Negative for headache All other ROS negative  ____________________________________________   PHYSICAL EXAM:  Constitutional: Alert and oriented. Well appearing and in no distress. Eyes: Strabismus, reactive pupils. ENT      Head: Normocephalic and atraumatic.      Mouth/Throat: Mucous membranes are moist. Cardiovascular: Normal rate, regular rhythm.  Respiratory: Normal respiratory effort without tachypnea nor retractions. Breath sounds are clear  Gastrointestinal: Soft and nontender. No distention.  Urostomy present. Musculoskeletal: Nontender with normal range of motion in all extremities.  Neurologic:  Normal speech and language.  Diffuse weakness in all extremities.   Skin:  Skin is warm, dry and intact.  Psychiatric: Mood and affect are normal.   ____________________________________________   INITIAL IMPRESSION / ASSESSMENT AND PLAN / ED COURSE  Pertinent labs & imaging results that were available during my care of the patient were reviewed by me and considered in my medical decision making (see chart for details).   Patient presents emergency department for generalized fatigue weakness starting at 5 PM.  Patient has a history of MS, has been wheelchair-bound at baseline.  History of urinary tract infections as well via urostomy.  We will check  labs, EKG, urine, COVID, IV hydrate and treat nausea while waiting results.  Differential is quite broad but would include infectious etiology, metabolic or electrolyte abnormality, dehydration, anemia, MS flare, less likely CVA given bilateral symptoms.  Patient's urinalysis does show urinary tract infection although it is a urostomy sample however patient has an elevated white blood cell count of 18,000 as well and appears somewhat dehydrated.  Patient receiving IV fluids, IV antibiotics we will send a urine culture.  Given the patient's generalized weakness I did discuss with the patient obtaining a new MRI to rule out MS flare.  Patient states her doctors have told her in the past not to do steroids I am not sure how much utility a new MRI would add.  Patient is agreeable to admission for IV antibiotics, fluids.  TEEGAN Pratt was evaluated in Emergency Department on 11/07/2020 for the symptoms described in the history of present illness. She was evaluated in the context of the global COVID-19 pandemic, which necessitated consideration that the patient might be at risk for infection with the SARS-CoV-2 virus that causes COVID-19. Institutional protocols and algorithms that pertain to the evaluation of patients at risk for COVID-19 are in a state of rapid change based on information  released by regulatory bodies including the CDC and federal and state organizations. These policies and algorithms were followed during the patient's care in the ED.  ____________________________________________   FINAL CLINICAL IMPRESSION(S) / ED DIAGNOSES  Weakness UTI   Minna Antis, MD 11/07/20 2251

## 2020-11-07 NOTE — ED Notes (Signed)
Urine specimen sent with patient label as sunquest not printing.   Lab called stating that blood draw was short for both CMP and Troponin and they will be dispatching a team member to re-collect. MD made aware.

## 2020-11-07 NOTE — ED Notes (Signed)
RN spoke with patient's sister to provide update regarding lab results to this point. Sister verbalized understanding of all discussed and denies further questions at this time.

## 2020-11-07 NOTE — ED Notes (Signed)
RN spoke with patient's sister, with patients provided verbal consent to provide update regarding treatment to this point. Sister denies further questions at this time and states will call back for further updates.

## 2020-11-07 NOTE — H&P (Signed)
History and Physical    [name] ZJQ:[id number] DOB: 03-26-1971 DOA: 11/07/2020  PCP: System, Provider Not In - [place], [place], [place] Patient coming from: home  I have personally briefly reviewed patient's old medical records in [facility]  Chief Complaint: Increased weakness  HPI: [name] is a 50 y.o. female with medical history significant of primary progressive MS with last OV [facility] 09/10/20, bladder neurogenesis with urostomy, who was in her usual state of health until day of admission when she had sudden onset of increased weakness, loosing the use of her arms. She has had similar episodes in the past associated with infection. She presented to [facility] for evaluation.   ED Course: T 98.3  148/93  HR 103  RR 16. CMet - nl, CBC with WBC 18.1, Hgb 12.4. U/A - turbid, concentrated, postive LE, many bacteria, >50 WBC/hpf. She has had some recovery of strength right arm.  TRH called to admit for continued treatment.   Review of Systems: As per HPI otherwise 10 point review of systems negative.    Past Medical History:  Diagnosis Date  . Abdominal wall hernia 09/05/2014  . 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 Edema leg 09/05/2014  . 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 MS (multiple sclerosis) (HCC) 2005  . 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 Neurogenic bowel 08/12/2012  . Neuropathic pain 05/21/2015    Past Surgical History:  Procedure Laterality Date  . ABDOMINAL HYSTERECTOMY  2001  . BACK SURGERY  2004  . REVISION UROSTOMY CUTANEOUS  06/2012    Soc Hx - single, lives with her mother and has a very supportive family (father, brothers, sisters) and church family. She is on disability but did work in the school system prior to paraplegia. She has a very positive and thankful attitude.   reports that she has never smoked. She has never used smokeless tobacco. She reports current alcohol  use. She reports that she does not use drugs.  Allergies  Allergen Reactions  . Latex Swelling and Other (See Comments)    Reaction:  Facial swelling     Family History  Problem Relation Age of Onset  . Osteoarthritis Mother   . Hypertension Mother   . Hyperlipidemia Mother   . Thyroid disease Mother   . Liver cancer Father   . Asthma Brother      Prior to Admission medications   Medication Sig Start Date End Date Taking? Authorizing Provider  acetaminophen (TYLENOL) 500 MG tablet Take 500 mg by mouth every 6 (six) hours as needed.    [provider]  amantadine (SYMMETREL) 100 MG capsule Take 100 mg by mouth 2 (two) times daily.    [provider]  baclofen (LIORESAL) 20 MG tablet Take 20 mg by mouth 2 (two) times daily.     [provider]  cholecalciferol (VITAMIN D) 1000 units tablet Take 1,000 Units by mouth daily.    [provider]  docusate sodium (COLACE) 100 MG capsule Take 100 mg by mouth 2 (two) times daily as needed for mild constipation.    [provider]  DULoxetine (CYMBALTA) 30 MG capsule Take 30 mg by mouth 2 (two) times daily.    [provider]  gabapentin (NEURONTIN) 400 MG capsule Take 2 capsules (800 mg total) by mouth 4 (four) times daily. Patient not taking: Reported on 07/24/2018 01/24/17   Alford Highland, MD  gabapentin (NEURONTIN) 800 MG  tablet Take 800 mg by mouth 4 (four) times daily.    [provider]  lactulose (CHRONULAC) 10 GM/15ML solution Take 15 mLs (10 g total) by mouth daily as needed for mild constipation or severe constipation. 07/26/18   Delfino Lovett, MD  polyethylene glycol (MIRALAX / GLYCOLAX) packet Take 17 g by mouth daily. 07/26/18   Delfino Lovett, MD    Physical Exam: Vitals:   11/07/20 1900  BP: (!) 148/93  Pulse: (!) 103  Resp: 16  Temp: 98.3 F (36.8 C)  TempSrc: Oral  SpO2: 98%     Vitals:   11/07/20 1900  BP: (!) 148/93  Pulse: (!) 103  Resp: 16  Temp:  98.3 F (36.8 C)  TempSrc: Oral  SpO2: 98%   General: overweight woman who is nauseated but otherwise in no distress Eyes: PERRL, lids and conjunctivae normal ENMT: Mucous membranes are moist. Posterior pharynx clear of any exudate or lesions.Normal dentition.  Neck: normal, supple, no masses, no thyromegaly Respiratory: clear to auscultation bilaterally, no wheezing, no crackles. Normal respiratory effort. No accessory muscle use.  Cardiovascular: Regular tachycardia, no murmurs / rubs / gallops. 1+ lower extremity edema. 2+ pedal pulses. No carotid bruits.  Abdomen: obese, no tenderness, no masses palpated. No hepatosplenomegaly. Bowel sounds positive.  Musculoskeletal: no clubbing / cyanosis. No joint deformity upper and lower extremities. Good ROM, no contractures. Decreased muscle tone.  Skin: no rashes, lesions, ulcers. No induration. Exam limited by paraplegia and size - pt does report an area of skin breakdown on her buttock Neurologic: CN 2-12 grossly intact. MS - 4/5 right UE, 3/5 left hand, wrist, 0/5 proximal left UE, 0/0 LE strength.  Psychiatric: Normal judgment and insight. Alert and oriented x 3. Normal mood.     Labs on Admission: I have personally reviewed following labs and imaging studies  CBC: Recent Labs  Lab 11/07/20 1949  WBC 18.1*  HGB 13.4  HCT 40.5  MCV 89.4  PLT 278   Basic Metabolic Panel: Recent Labs  Lab 11/07/20 1949  NA 138  K 3.7  CL 103  CO2 19*  GLUCOSE 73  BUN 12  CREATININE 0.49  CALCIUM 9.1   GFR: CrCl cannot be calculated (Unknown ideal weight.). Liver Function Tests: Recent Labs  Lab 11/07/20 1949  AST 17  ALT 13  ALKPHOS 56  BILITOT 0.8  PROT 8.5*  ALBUMIN 4.0   No results for input(s): LIPASE, AMYLASE in the last 168 hours. No results for input(s): AMMONIA in the last 168 hours. Coagulation Profile: No results for input(s): INR, PROTIME in the last 168 hours. Cardiac Enzymes: No results for input(s): CKTOTAL,  CKMB, CKMBINDEX, TROPONINI in the last 168 hours. BNP (last 3 results) No results for input(s): PROBNP in the last 8760 hours. HbA1C: No results for input(s): HGBA1C in the last 72 hours. CBG: No results for input(s): GLUCAP in the last 168 hours. Lipid Profile: No results for input(s): CHOL, HDL, LDLCALC, TRIG, CHOLHDL, LDLDIRECT in the last 72 hours. Thyroid Function Tests: No results for input(s): TSH, T4TOTAL, FREET4, T3FREE, THYROIDAB in the last 72 hours. Anemia Panel: No results for input(s): VITAMINB12, FOLATE, FERRITIN, TIBC, IRON, RETICCTPCT in the last 72 hours. Urine analysis:    Component Value Date/Time   COLORURINE YELLOW (A) 11/07/2020 2038   APPEARANCEUR TURBID (A) 11/07/2020 2038   APPEARANCEUR Cloudy 10/05/2014 2100   LABSPEC 1.010 11/07/2020 2038   LABSPEC 1.009 10/05/2014 2100   PHURINE 9.0 (H) 11/07/2020 2038   GLUCOSEU  NEGATIVE 11/07/2020 2038   GLUCOSEU Negative 10/05/2014 2100   HGBUR SMALL (A) 11/07/2020 2038   BILIRUBINUR NEGATIVE 11/07/2020 2038   BILIRUBINUR Negative 10/05/2014 2100   KETONESUR 20 (A) 11/07/2020 2038   PROTEINUR 100 (A) 11/07/2020 2038   NITRITE NEGATIVE 11/07/2020 2038   LEUKOCYTESUR MODERATE (A) 11/07/2020 2038   LEUKOCYTESUR 3+ 10/05/2014 2100    Radiological Exams on Admission: No results found.  EKG: Independently reviewed. No EKG available  Assessment/Plan Active Problems:   Complicated UTI (urinary tract infection)   Multiple sclerosis, primary progressive (HCC)   Neuropathic pain   Neurogenic bowel    1. Complicated UTI - patient with neurogenic bladder and urostomy with a history of UTI and h/o sepsis in the past now with leukocytosis and positive U/A Plan Ceftriaxone 1 g IV q 24 for at least two doses and the convert to oral medication  Urine Cx - will adjust meds as indicated  IV hydration  Anti-emetics  2. Progressive primary MS - patient has close outpatient follow up - spoke by video with her NP provider  on Monday. No indication for MRI at this time - she is known to have progressive plaques.  DVT prophylaxis: lovenox  Code Status: full code  Family Communication: spoke with Marshell Garfinkel - oldest brother- presenting Dx and Tx plan. He understands and is thankful for our care. Disposition Plan: home 24-48 hrs  Consults called: none (with names) Admission status: obs    Illene Regulus MD [facility] Pager [phone]  If 7PM-7AM, please contact night-coverage [email or web address] Password [facility]  11/07/2020, 10:54 PM

## 2020-11-07 NOTE — ED Notes (Signed)
Lab called to request assistance with blood draw for patient following 4 unsuccessful attempts on dayshift. MD made aware.

## 2020-11-07 NOTE — ED Triage Notes (Signed)
Pt Presents via EMS c/o left arm weakness starting at [employer]. Reports hx MS. Also c/o lower BE weakness that is chronic. Pt is bed bound at baseline.

## 2020-11-08 ENCOUNTER — Encounter: Payer: Self-pay | Admitting: Internal Medicine

## 2020-11-08 DIAGNOSIS — A419 Sepsis, unspecified organism: Secondary | ICD-10-CM | POA: Diagnosis present

## 2020-11-08 DIAGNOSIS — N390 Urinary tract infection, site not specified: Secondary | ICD-10-CM

## 2020-11-08 DIAGNOSIS — G8220 Paraplegia, unspecified: Secondary | ICD-10-CM | POA: Diagnosis present

## 2020-11-08 DIAGNOSIS — Z79899 Other long term (current) drug therapy: Secondary | ICD-10-CM | POA: Diagnosis not present

## 2020-11-08 DIAGNOSIS — Z7401 Bed confinement status: Secondary | ICD-10-CM | POA: Diagnosis not present

## 2020-11-08 DIAGNOSIS — Y846 Urinary catheterization as the cause of abnormal reaction of the patient, or of later complication, without mention of misadventure at the time of the procedure: Secondary | ICD-10-CM | POA: Diagnosis present

## 2020-11-08 DIAGNOSIS — N319 Neuromuscular dysfunction of bladder, unspecified: Secondary | ICD-10-CM | POA: Diagnosis present

## 2020-11-08 DIAGNOSIS — K592 Neurogenic bowel, not elsewhere classified: Secondary | ICD-10-CM

## 2020-11-08 DIAGNOSIS — N3000 Acute cystitis without hematuria: Secondary | ICD-10-CM | POA: Diagnosis present

## 2020-11-08 DIAGNOSIS — G35 Multiple sclerosis: Secondary | ICD-10-CM

## 2020-11-08 DIAGNOSIS — T83518A Infection and inflammatory reaction due to other urinary catheter, initial encounter: Secondary | ICD-10-CM | POA: Diagnosis present

## 2020-11-08 DIAGNOSIS — Z87440 Personal history of urinary (tract) infections: Secondary | ICD-10-CM | POA: Diagnosis not present

## 2020-11-08 DIAGNOSIS — Z993 Dependence on wheelchair: Secondary | ICD-10-CM | POA: Diagnosis not present

## 2020-11-08 DIAGNOSIS — Z91040 Latex allergy status: Secondary | ICD-10-CM | POA: Diagnosis not present

## 2020-11-08 DIAGNOSIS — Z90710 Acquired absence of both cervix and uterus: Secondary | ICD-10-CM | POA: Diagnosis not present

## 2020-11-08 DIAGNOSIS — Z20822 Contact with and (suspected) exposure to covid-19: Secondary | ICD-10-CM | POA: Diagnosis present

## 2020-11-08 DIAGNOSIS — R531 Weakness: Secondary | ICD-10-CM | POA: Diagnosis present

## 2020-11-08 DIAGNOSIS — E872 Acidosis: Secondary | ICD-10-CM | POA: Diagnosis present

## 2020-11-08 LAB — HIV ANTIBODY (ROUTINE TESTING W REFLEX): HIV Screen 4th Generation wRfx: NONREACTIVE

## 2020-11-08 LAB — CBC
HCT: 40.7 % (ref 36.0–46.0)
Hemoglobin: 13 g/dL (ref 12.0–15.0)
MCH: 30 pg (ref 26.0–34.0)
MCHC: 31.9 g/dL (ref 30.0–36.0)
MCV: 93.8 fL (ref 80.0–100.0)
Platelets: 314 10*3/uL (ref 150–400)
RBC: 4.34 MIL/uL (ref 3.87–5.11)
RDW: 14.1 % (ref 11.5–15.5)
WBC: 19.7 10*3/uL — ABNORMAL HIGH (ref 4.0–10.5)
nRBC: 0 % (ref 0.0–0.2)

## 2020-11-08 LAB — LACTIC ACID, PLASMA: Lactic Acid, Venous: 1.1 mmol/L (ref 0.5–1.9)

## 2020-11-08 LAB — TROPONIN I (HIGH SENSITIVITY): Troponin I (High Sensitivity): 5 ng/L (ref <18)

## 2020-11-08 MED ORDER — ONDANSETRON HCL 4 MG/2ML IJ SOLN: 4.0000 mg | Freq: Four times a day (QID) | INTRAMUSCULAR | Status: DC | PRN

## 2020-11-08 MED ORDER — GABAPENTIN 400 MG PO CAPS
800.0000 mg | ORAL_CAPSULE | Freq: Three times a day (TID) | ORAL | Status: DC
  Administered 2020-11-08 – 2020-11-10 (×7): 800 mg via ORAL
  Filled 2020-11-08 (×3): qty 2
  Filled 2020-11-08: qty 8
  Filled 2020-11-08 (×3): qty 2
  Filled 2020-11-08: qty 8
  Filled 2020-11-08 (×4): qty 2

## 2020-11-08 MED ORDER — SODIUM CHLORIDE 0.9 % IV SOLN: INTRAVENOUS | Status: DC

## 2020-11-08 MED ORDER — ENOXAPARIN SODIUM 60 MG/0.6ML ~~LOC~~ SOLN
0.5000 mg/kg | SUBCUTANEOUS | Status: DC
  Administered 2020-11-08 – 2020-11-10 (×3): 52.5 mg via SUBCUTANEOUS
  Filled 2020-11-08 (×3): qty 0.6

## 2020-11-08 MED ORDER — SODIUM CHLORIDE 0.9 % IV SOLN
1.0000 g | INTRAVENOUS | Status: DC
  Administered 2020-11-08 – 2020-11-09 (×2): 1 g via INTRAVENOUS
  Filled 2020-11-08 (×3): qty 10

## 2020-11-08 NOTE — ED Notes (Signed)
RN attempted AM blood draw without success. Lab called for assistance.

## 2020-11-08 NOTE — ED Notes (Signed)
Pt sat up to eat some breakfast. Pt provided phone to call family.

## 2020-11-08 NOTE — ED Notes (Signed)
Collected blood culture and lactic acid (couldn't be added on to previous collection). Pt asleep.  PO meds held because pt not staying awake.

## 2020-11-08 NOTE — Progress Notes (Signed)
PHARMACIST - PHYSICIAN COMMUNICATION  CONCERNING:  Enoxaparin (Lovenox) for DVT Prophylaxis    RECOMMENDATION: Patient was prescribed enoxaprin 40mg  q24 hours for VTE prophylaxis.   Filed Weights   11/08/20 0029  Weight: 103.3 kg (227 lb 11.8 oz)    Body mass index is 39.09 kg/m.  Estimated Creatinine Clearance: 98.4 mL/min (by C-G formula based on SCr of 0.49 mg/dL).   Based on [facility] policy patient is candidate for enoxaparin 0.5mg /kg TBW SQ every 24 hours based on BMI being >30.   DESCRIPTION: Pharmacy has adjusted enoxaparin dose per [facility] policy.  Patient is now receiving enoxaparin 0.5 mg/kg every 24 hours   [facility], PharmD, [facility] 11/08/2020 12:32 AM

## 2020-11-08 NOTE — ED Notes (Signed)
Pharmacy now verified medications. RN contacted pharmacy to request they send ordered medications for RN to administer.

## 2020-11-08 NOTE — ED Notes (Signed)
Called lab again to ask them to come draw pt's morning labs.

## 2020-11-08 NOTE — ED Notes (Signed)
This RN dialed phone for pt and let her talk to her mother and her sister.

## 2020-11-08 NOTE — Progress Notes (Signed)
[facility]  - [place] at [facility]   PATIENT NAME: [name]    MR#:  [id number]  DATE OF BIRTH:  04/27/1971  SUBJECTIVE:  patient came in from home with increasing weakness in her upper extremity left more than right. She tells me weakness gets worse due to her MS in the when she has infection. Found to have UTI. She is bedbound at baseline. She lives with her mother at home. Has  Aide who helps her.  REVIEW OF SYSTEMS:   Review of Systems  Constitutional: Negative for chills, fever and weight loss.  HENT: Negative for ear discharge, ear pain and nosebleeds.   Eyes: Negative for blurred vision, pain and discharge.  Respiratory: Negative for sputum production, shortness of breath, wheezing and stridor.   Cardiovascular: Negative for chest pain, palpitations, orthopnea and PND.  Gastrointestinal: Negative for abdominal pain, diarrhea, nausea and vomiting.  Genitourinary: Negative for frequency and urgency.  Musculoskeletal: Negative for back pain and joint pain.  Neurological: Positive for weakness. Negative for sensory change, speech change and focal weakness.       Both UE.  Severe LE weakness due to MS--chronic  Psychiatric/Behavioral: Negative for depression and hallucinations. The patient is not nervous/anxious.    Tolerating Diet: Tolerating PT:   DRUG ALLERGIES:   Allergies  Allergen Reactions  . Latex Swelling and Other (See Comments)    Reaction:  Facial swelling     VITALS:  Blood pressure (!) 130/49, pulse (!) 117, temperature 98.3 F (36.8 C), temperature source Oral, resp. rate 19, height 5\' 4"  (1.626 m), weight 103.3 kg, SpO2 96 %.  PHYSICAL EXAMINATION:   Physical Exam  GENERAL:  50 y.o.-year-old patient lying in the bed with no acute distress. obeseLUNGS: Normal breath sounds bilaterally, no wheezing, rales, rhonchi. No use of accessory muscles of respiration.  CARDIOVASCULAR: S1, S2 normal. No murmurs, rubs, or gallops.  Tachycardia+ ABDOMEN: Soft, nontender, nondistended. Bowel sounds present. No organomegaly or mass.  EXTREMITIES: No cyanosis, clubbing or edema b/l.    NEUROLOGIC: Cranial nerves II through XII are intact.UE 2/5 LE 2/5 Reflexes absent PSYCHIATRIC:  patient is alert and oriented x 3.  SKIN: No obvious rash, lesion, or ulcer. Per RN . LABORATORY PANEL:  CBC Recent Labs  Lab 11/08/20 1124  WBC 19.7*  HGB 13.0  HCT 40.7  PLT 314    Chemistries  Recent Labs  Lab 11/07/20 1949  NA 138  K 3.7  CL 103  CO2 19*  GLUCOSE 73  BUN 12  CREATININE 0.49  CALCIUM 9.1  AST 17  ALT 13  ALKPHOS 56  BILITOT 0.8   Cardiac Enzymes No results for input(s): TROPONINI in the last 168 hours. RADIOLOGY:  No results found. ASSESSMENT AND PLAN:   TEDRA [name] is a 50 y.o. female with medical history significant of primary progressive MS with last OV [facility] 09/10/20, bladder neurogenesis with urostomy, who was in her usual state of health until day of admission when she had sudden onset of increased weakness, loosing the use of her arms. She has had similar episodes in the past associated with infection.  Sepsis POA due to Acute cystitis/UTI  - patient with neurogenic bladder and urostomy with a history of UTI and h/o sepsis in the past now with leukocytosis and positive U/A - patient presented with sudden onset weakness in both upper extremity, tachycardia, elevated white count and abnormal UA. -- cont  Ceftriaxone 1 g IV q 24 f  --Urine  Cx - will adjust meds as indicated --BC not sent from ER--will send today --cont IV hydration --prn Anti-emetics --check lactic acid   Progressive primary MS - patient has close outpatient follow up with [facility] NP/Neurlogy - spoke by video with her NP provider on Monday. No indication for MRI at this time - she is known to have progressive plaques. -- At baseline patient is bedbound. She has a power wheelchair. She is able to use her upper extremities  normally however at present has significant weakness in the setting of infection. Patient has had this before. -OT to see pt  Leukocytosis/Tachycardia likely secondary to UTI -18K--19K --no fever  Anion gap acidosis suspected due to sepsis -- continue IV hydration and monitor labs  DVT prophylaxis: lovenox  Code Status: full code  Family Communication: none today Disposition Plan: home in 1-3 days Consults called: none  Admission status:   Level of care: Med-Surg Status is: Inpatient  Remains inpatient appropriate because:Inpatient level of care appropriate due to severity of illness   TOTAL TIME TAKING CARE OF THIS PATIENT: *25* minutes.  >50% time spent on counselling and coordination of care  Note: This dictation was prepared with Dragon dictation along with smaller phrase technology. Any transcriptional errors that result from this process are unintentional.  [name] M.D    [facility]   CC: Primary care physician; System, Provider Not InPatient ID: [name], female   DOB: [birth year], 50 y.o.   MRN: [id number]

## 2020-11-09 DIAGNOSIS — A419 Sepsis, unspecified organism: Secondary | ICD-10-CM | POA: Diagnosis not present

## 2020-11-09 DIAGNOSIS — K592 Neurogenic bowel, not elsewhere classified: Secondary | ICD-10-CM | POA: Diagnosis not present

## 2020-11-09 DIAGNOSIS — G35 Multiple sclerosis: Secondary | ICD-10-CM | POA: Diagnosis not present

## 2020-11-09 DIAGNOSIS — N390 Urinary tract infection, site not specified: Secondary | ICD-10-CM | POA: Diagnosis not present

## 2020-11-09 LAB — BLOOD CULTURE ID PANEL (REFLEXED) - BCID2

## 2020-11-09 LAB — URINE CULTURE

## 2020-11-09 NOTE — Progress Notes (Signed)
[facility]  - [place] at [facility]   PATIENT NAME: [name]    MR#:  [id number]  DATE OF BIRTH:  [birth year]  SUBJECTIVE:  patient came in from home with increasing weakness in her upper extremity left more than right. She tells me weakness gets worse due to her MS in the when she has infection. Found to have UTI. She is bedbound at baseline. She lives with her mother at home.  Patient feels a whole lot better. She is able to feed herself and no fever. Slept well  REVIEW OF SYSTEMS:   Review of Systems  Constitutional: Negative for chills, fever and weight loss.  HENT: Negative for ear discharge, ear pain and nosebleeds.   Eyes: Negative for blurred vision, pain and discharge.  Respiratory: Negative for sputum production, shortness of breath, wheezing and stridor.   Cardiovascular: Negative for chest pain, palpitations, orthopnea and PND.  Gastrointestinal: Negative for abdominal pain, diarrhea, nausea and vomiting.  Genitourinary: Negative for frequency and urgency.  Musculoskeletal: Negative for back pain and joint pain.  Neurological: Positive for weakness. Negative for sensory change, speech change and focal weakness.       Both UE.  Severe LE weakness due to MS--chronic  Psychiatric/Behavioral: Negative for depression and hallucinations. The patient is not nervous/anxious.    Tolerating Diet:yes Tolerating PT: bedbound  DRUG ALLERGIES:   Allergies  Allergen Reactions  . Latex Swelling and Other (See Comments)    Reaction:  Facial swelling     VITALS:  Blood pressure (!) 118/51, pulse 96, temperature (!) 97.5 F (36.4 C), temperature source Oral, resp. rate 18, height 5\' 4"  (1.626 m), weight 102.4 kg, SpO2 100 %.  PHYSICAL EXAMINATION:   Physical Exam  GENERAL:  50 y.o.-year-old patient lying in the bed with no acute distress. obeseLUNGS: Normal breath sounds bilaterally, no wheezing, rales, rhonchi. No use of accessory muscles of  respiration.  CARDIOVASCULAR: S1, S2 normal. No murmurs, rubs, or gallops. Tachycardia+ ABDOMEN: Soft, nontender, nondistended. Bowel sounds present. No organomegaly or mass.  EXTREMITIES: No cyanosis, clubbing or edema b/l.    NEUROLOGIC: Cranial nerves II through XII are intact.UE 2/5 LE 2/5 Reflexes absent PSYCHIATRIC:  patient is alert and oriented x 3.  SKIN: No obvious rash, lesion, or ulcer. Per RN . LABORATORY PANEL:  CBC Recent Labs  Lab 11/08/20 1124  WBC 19.7*  HGB 13.0  HCT 40.7  PLT 314    Chemistries  Recent Labs  Lab 11/07/20 1949  NA 138  K 3.7  CL 103  CO2 19*  GLUCOSE 73  BUN 12  CREATININE 0.49  CALCIUM 9.1  AST 17  ALT 13  ALKPHOS 56  BILITOT 0.8   Cardiac Enzymes No results for input(s): TROPONINI in the last 168 hours. RADIOLOGY:  No results found. ASSESSMENT AND PLAN:   ABBYGALE Pratt is a 50 y.o. female with medical history significant of primary progressive MS with last OV [facility] 09/10/20, bladder neurogenesis with urostomy, who was in her usual state of health until day of admission when she had sudden onset of increased weakness, loosing the use of her arms. She has had similar episodes in the past associated with infection.  Sepsis POA due to Acute cystitis/UTI  - patient with neurogenic bladder and urostomy with a history of UTI and h/o sepsis in the past now with leukocytosis and positive U/A -sepsis improving - patient presented with sudden onset weakness in both upper extremity, tachycardia, elevated white count and  abnormal UA. -- cont  Ceftriaxone 1 g IV q 24 -- change to oral antibiotic tomorrow  --Urine Cx multiple species --BC neg for 24 hours --received IV hydration --prn Anti-emetics --check lactic acid--1.1   Progressive primary MS - patient has close outpatient follow up with [facility] NP/Neurlogy - spoke by video with her NP provider on Monday. No indication for MRI at this time - she is known to have progressive  plaques. -- At baseline patient is bedbound. She has a power wheelchair. She is able to use her upper extremities normally however at present has significant weakness in the setting of infection. Patient has had this before. -OT to see pt  Leukocytosis/Tachycardia likely secondary to UTI -18K--19K --no fever  Anion gap acidosis suspected due to sepsis -- received IV fluids. Will repeat metabolic panel tomorrow  DVT prophylaxis: lovenox  Code Status: full code  Family Communication: sister [name] on the phone Disposition Plan: home likely tomorrow Consults called: none  Admission status:   Level of care: Med-Surg Status is: Inpatient  Remains inpatient appropriate because:Inpatient level of care appropriate due to severity of illness  Patient continues to improve. Will monitor her for one more day and discharge her tomorrow if continues to remain stable. Patient and sister [name] agreeable with the plan  TOTAL TIME TAKING CARE OF THIS PATIENT: *25* minutes.  >50% time spent on counselling and coordination of care  Note: This dictation was prepared with Dragon dictation along with smaller phrase technology. Any transcriptional errors that result from this process are unintentional.  [name] M.D    [facility]   CC: Primary care physician; System, Provider Not InPatient ID: [name], female   DOB: January 02, 1971, 50 y.o.   MRN: [id number]

## 2020-11-09 NOTE — Progress Notes (Signed)
PHARMACY - PHYSICIAN COMMUNICATION CRITICAL VALUE ALERT - BLOOD CULTURE IDENTIFICATION (BCID)  [name] is an 50 y.o. female who presented to [facility] on 11/07/2020  Assessment: 1/2 bottles GPC, staph species, identification pending; patient afebrile, expect contaminant  Name of physician (or Provider) Contacted: Manuela Schwartz, NP  Current antibiotics: ceftriaxone  Changes to prescribed antibiotics recommended: none  Results for orders placed or performed during the hospital encounter of 11/07/20  Blood Culture ID Panel (Reflexed) (Collected: 11/08/2020  3:01 PM)  Result Value Ref Range   Enterococcus faecalis NOT DETECTED NOT DETECTED   Enterococcus Faecium NOT DETECTED NOT DETECTED   Listeria monocytogenes NOT DETECTED NOT DETECTED   Staphylococcus species DETECTED (A) NOT DETECTED   Staphylococcus aureus (BCID) NOT DETECTED NOT DETECTED   Staphylococcus epidermidis NOT DETECTED NOT DETECTED   Staphylococcus lugdunensis NOT DETECTED NOT DETECTED   Streptococcus species NOT DETECTED NOT DETECTED   Streptococcus agalactiae NOT DETECTED NOT DETECTED   Streptococcus pneumoniae NOT DETECTED NOT DETECTED   Streptococcus pyogenes NOT DETECTED NOT DETECTED   A.calcoaceticus-baumannii NOT DETECTED NOT DETECTED   Bacteroides fragilis NOT DETECTED NOT DETECTED   Enterobacterales NOT DETECTED NOT DETECTED   Enterobacter cloacae complex NOT DETECTED NOT DETECTED   Escherichia coli NOT DETECTED NOT DETECTED   Klebsiella aerogenes NOT DETECTED NOT DETECTED   Klebsiella oxytoca NOT DETECTED NOT DETECTED   Klebsiella pneumoniae NOT DETECTED NOT DETECTED   Proteus species NOT DETECTED NOT DETECTED   Salmonella species NOT DETECTED NOT DETECTED   Serratia marcescens NOT DETECTED NOT DETECTED   Haemophilus influenzae NOT DETECTED NOT DETECTED   Neisseria meningitidis NOT DETECTED NOT DETECTED   Pseudomonas aeruginosa NOT DETECTED NOT DETECTED   Stenotrophomonas maltophilia NOT  DETECTED NOT DETECTED   Candida albicans NOT DETECTED NOT DETECTED   Candida auris NOT DETECTED NOT DETECTED   Candida glabrata NOT DETECTED NOT DETECTED   Candida krusei NOT DETECTED NOT DETECTED   Candida parapsilosis NOT DETECTED NOT DETECTED   Candida tropicalis NOT DETECTED NOT DETECTED   Cryptococcus neoformans/gattii NOT DETECTED NOT DETECTED    [name], PharmD 11/09/2020  8:19 PM

## 2020-11-09 NOTE — Evaluation (Signed)
Physical Therapy Evaluation Patient Details Name: [name] MRN: [id number] DOB: 1971-03-09 Today's Date: 11/09/2020   History of Present Illness  [name] is a 49yoF who comes to [facility] on 2/12 c acute onset weakness. P tadmitted with UTI. PMH: MS c neurogenic bladder, WC use at baseline, urostomy, UTI. At baseline pt lives with mother at home, has an Engineer, production as well.  Clinical Impression  Pt admitted with above diagnosis. Pt currently with functional limitations due to the deficits listed below (see "PT Problem List"). Upon entry, pt in bed, awake and agreeable to participate. The pt is alert, pleasant, interactive, and able to provide info regarding prior level of function, both in tolerance and independence. At baseline pt is total care for gross mobility, but can self propel in power WC, and is able to self feed and perform some basic self-care activity like washing her face, etc. This date, examination reveals significant loss of gross motor function in the LUE, mild lingering acute weakness in the RUE. As pt requires hoyer transfers to [facility] at baseline, pt is at her functional baseline level of independence. Will defer acute UE impairment to OT while admitted. May benefit from HHPT referral at DC. No additional skilled PT services needed at this time, PT signing off.       Follow Up Recommendations Home health PT;Supervision - Intermittent    Equipment Recommendations  None recommended by PT    Recommendations for Other Services OT consult     Precautions / Restrictions Precautions Precautions: Fall Precaution Comments: high risk for bedsores Restrictions Weight Bearing Restrictions: No      Mobility  Bed Mobility               General bed mobility comments: deferred; pt is totalA for bed mobilty at baseline; pt unable to pull self into tall sitting with RUE.    Transfers                    Ambulation/Gait                Stairs             Wheelchair Mobility    Modified Rankin (Stroke Patients Only)       Balance                                             Pertinent Vitals/Pain Pain Assessment: No/denies pain    Home Living Family/patient expects to be discharged to:: Private residence Living Arrangements: Parent Available Help at Discharge: Family;Personal care attendant (aide in home M-F (5 hrs/day); Sat (3hrs)) Type of Home: House Home Access: Ramped entrance     Home Layout: One level Home Equipment: Wheelchair - power;Hospital bed;Wheelchair - manual Additional Comments: electric lift for bed to/from WC; pt does her toiletting/bathing in the bed at home;    Prior Function Level of Independence: Needs assistance   Gait / Transfers Assistance Needed: non ambulatory for several years; electric lift for transfers, power WC at baseline; pt has a WC accessible van which her aide can use to transport he rplaces. Pt reports she is totalA for rolling in bed, unable to contribute any.  ADL's / Homemaking Assistance Needed: aide helps with all ADL, mother no longer able to help with ADL; pt's brother is able to help sometimes as needed.  Comments: Pt's  mother has he rown aide, unable to phsyically assist patient;     Hand Dominance        Extremity/Trunk Assessment   Upper Extremity Assessment Upper Extremity Assessment: Generalized weakness;RUE deficits/detail;LUE deficits/detail RUE Deficits / Details: 4/5 elbow flexion, extension, mod grip weakness; able to bring Rt hand to face with ease LUE Deficits / Details: 3/5 finger extension; 1/5 wrist (grossly); 1/5 elbow (grossly); shoulder not assessed    Lower Extremity Assessment Lower Extremity Assessment: Generalized weakness (at baseline has no A/ROM of legs. myoclonus bilat with P/ROM ankle DF; Left heel in mattress, slight redness, hence elevated.)       Communication      Cognition Arousal/Alertness: Awake/alert Behavior  During Therapy: WFL for tasks assessed/performed Overall Cognitive Status: Within Functional Limits for tasks assessed                                        General Comments      Exercises     Assessment/Plan    PT Assessment Patent does not need any further PT services  PT Problem List Decreased strength;Decreased mobility       PT Treatment Interventions      PT Goals (Current goals can be found in the Care Plan section)  Acute Rehab PT Goals Patient Stated Goal: Regain strength in her arms. PT Goal Formulation: All assessment and education complete, DC therapy Time For Goal Achievement: 11/23/20 Potential to Achieve Goals: Good    Frequency     Barriers to discharge        Co-evaluation               AM-PAC PT "6 Clicks" Mobility  Outcome Measure Help needed turning from your back to your side while in a flat bed without using bedrails?: Total Help needed moving from lying on your back to sitting on the side of a flat bed without using bedrails?: Total Help needed moving to and from a bed to a chair (including a wheelchair)?: Total Help needed standing up from a chair using your arms (e.g., wheelchair or bedside chair)?: Total Help needed to walk in hospital room?: Total Help needed climbing 3-5 steps with a railing? : Total 6 Click Score: 6    End of Session   Activity Tolerance: Patient tolerated treatment well;No increased pain Patient left: in bed;with call bell/phone within reach Nurse Communication: Mobility status;Other (comment) (float heels) PT Visit Diagnosis: Other abnormalities of gait and mobility (R26.89);Other symptoms and signs involving the nervous system (R29.898)    Time: 9622-2979 PT Time Calculation (min) (ACUTE ONLY): 32 min   Charges:   PT Evaluation $PT Eval Moderate Complexity: 1 Mod          4:19 PM, 11/09/20 Rosamaria Lints, PT, DPT Physical Therapist - [facility]   [phone] (ASCOM)   , C 11/09/2020, 4:13 PM

## 2020-11-10 DIAGNOSIS — N390 Urinary tract infection, site not specified: Secondary | ICD-10-CM | POA: Diagnosis not present

## 2020-11-10 DIAGNOSIS — A419 Sepsis, unspecified organism: Secondary | ICD-10-CM | POA: Diagnosis not present

## 2020-11-10 DIAGNOSIS — G35 Multiple sclerosis: Secondary | ICD-10-CM | POA: Diagnosis not present

## 2020-11-10 DIAGNOSIS — K592 Neurogenic bowel, not elsewhere classified: Secondary | ICD-10-CM | POA: Diagnosis not present

## 2020-11-10 LAB — CBC
HCT: 38.2 % (ref 36.0–46.0)
Hemoglobin: 12.6 g/dL (ref 12.0–15.0)
MCH: 30.1 pg (ref 26.0–34.0)
MCHC: 33 g/dL (ref 30.0–36.0)
MCV: 91.2 fL (ref 80.0–100.0)
Platelets: 276 10*3/uL (ref 150–400)
RBC: 4.19 MIL/uL (ref 3.87–5.11)
RDW: 14.2 % (ref 11.5–15.5)
WBC: 10.1 10*3/uL (ref 4.0–10.5)
nRBC: 0 % (ref 0.0–0.2)

## 2020-11-10 LAB — BASIC METABOLIC PANEL
Anion gap: 7 (ref 5–15)
BUN: 7 mg/dL (ref 6–20)
CO2: 24 mmol/L (ref 22–32)
Calcium: 9.1 mg/dL (ref 8.9–10.3)
Chloride: 106 mmol/L (ref 98–111)
Creatinine, Ser: 0.6 mg/dL (ref 0.44–1.00)
GFR, Estimated: >60 mL/min (ref >60)
Glucose, Bld: 106 mg/dL — ABNORMAL HIGH (ref 70–99)
Potassium: 3.9 mmol/L (ref 3.5–5.1)
Sodium: 137 mmol/L (ref 135–145)

## 2020-11-10 MED ORDER — CEPHALEXIN 500 MG PO CAPS: 500.0000 mg | ORAL_CAPSULE | Freq: Three times a day (TID) | ORAL | 0 refills | Status: AC

## 2020-11-10 MED ORDER — CEPHALEXIN 500 MG PO CAPS
500.0000 mg | ORAL_CAPSULE | Freq: Three times a day (TID) | ORAL | 0 refills | Status: DC
Start: 2020-11-10 — End: 2020-11-10

## 2020-11-10 MED ORDER — CEPHALEXIN 500 MG PO CAPS
500.0000 mg | ORAL_CAPSULE | Freq: Three times a day (TID) | ORAL | Status: DC
  Administered 2020-11-10 (×2): 500 mg via ORAL
  Filled 2020-11-10 (×2): qty 1

## 2020-11-10 NOTE — Evaluation (Signed)
Occupational Therapy Evaluation Patient Details Name: [name] MRN: [id number] DOB: 06-Oct-1970 Today's Date: 11/10/2020    History of Present Illness [name] is a 49yoF who comes to [facility] on 2/12 c acute onset weakness. P tadmitted with UTI. PMH: MS c neurogenic bladder, WC use at baseline, urostomy, UTI. At baseline pt lives with mother at home, has an Engineer, production as well.   Clinical Impression   Pt seen for OT evaluation this date. Upon arrival to room, pt seated upright in bed with no pain. Prior to admission, pt was able to complete grooming, UB bathing, and UB dressing with SET-UP-MIN A. At baseline, pt can self-propel in power WC. Since admission, pt reports significant increase in UE strength, however reports that strength has not returned to baseline. This session, pt was able to wash/dry face and brush teeth with SET-UP assistance. Pt was able to participate in UE there-ex, with pt eager to continue via at-home there-ex program; handout provided. Pt would benefit from additional skilled OT services to improve strength and maximize return to PLOF. Upon discharge, recommend intermittent assistance/supervision and outpatient OT services.      Follow Up Recommendations  Outpatient OT;Supervision - Intermittent    Equipment Recommendations  None recommended by OT       Precautions / Restrictions Precautions Precautions: Fall Restrictions Weight Bearing Restrictions: No      Mobility Bed Mobility               General bed mobility comments: deferred; pt is totalA for bed mobilty at baseline                         ADL either performed or assessed with clinical judgement   ADL Overall ADL's : Needs assistance/impaired     Grooming: Wash/dry face;Oral care;Set up;Bed level Grooming Details (indicate cue type and reason): Pt required assistance removing toothbrush from packaging. Pt able to screw top off toothpaste with engagement of b/l UE                                      Vision Baseline Vision/History: Wears glasses Wears Glasses: Reading only Patient Visual Report: No change from baseline              Pertinent Vitals/Pain Pain Assessment: No/denies pain        Extremity/Trunk Assessment Upper Extremity Assessment RUE Deficits / Details: 3+/5 shoulder flexion, 4/5 elbow flexion/extension, grossly 4/5 grip strength RUE Sensation: WNL LUE Deficits / Details: 2+/5 shoulder flexion, 3/5 elbow flexion, grossly 3/5 grip strength LUE Sensation: WNL   Lower Extremity Assessment Lower Extremity Assessment: Generalized weakness (at baseline has no AROM of LE)       Communication Communication Communication: No difficulties   Cognition Arousal/Alertness: Awake/alert Behavior During Therapy: WFL for tasks assessed/performed Overall Cognitive Status: Within Functional Limits for tasks assessed                                 General Comments: Pleasant and agreeable thoroughout. Eager to receive at home therex program   General Comments       Exercises General Exercises - Upper Extremity Shoulder Flexion: Self ROM;Both;5 reps;Seated (via long-chair position) Shoulder ABduction: Self ROM;Both;5 reps;Seated (via long-chair position) Shoulder Horizontal ABduction: Self ROM;Both;10 reps;Seated (via long-chair position) Elbow Flexion: Self ROM;Both;10  reps;Seated (via long-chair position) Wrist Flexion: Self ROM;10 reps;Seated Other Exercises Other Exercises: Provided education on UE self ROM exercises; home program and handout provided        Home Living Family/patient expects to be discharged to:: Private residence Living Arrangements: Parent Available Help at Discharge: Family;Personal care attendant (aide in home M-F (5 hrs/day); Sat (3hrs)) Type of Home: House Home Access: Ramped entrance     Home Layout: One level               Home Equipment: Wheelchair - power;Hospital  bed;Wheelchair - manual   Additional Comments: Pt uses electric lift for bed to/from WC; pt does her toiletting/bathing in the bed at home      Prior Functioning/Environment Level of Independence: Needs assistance  Gait / Transfers Assistance Needed: non ambulatory for several years; electric lift for transfers, power WC at baseline; pt has a WC accessible van which her aide can use to transport her places. Pt reports she is totalA for rolling in bed, unable to contribute any. ADL's / Homemaking Assistance Needed: Aide helps with all LB ADLs, pt able to participate in UB bathing/dressing. Pt interested in taking film-making classes and going to the [facility] with aide for UB strengthening. Mother no longer able to help with ADL; pt's brother is able to help sometimes as needed.   Comments: Pt's mother has her own aide, unable to phsyically assist patient        OT Problem List: Decreased strength;Decreased range of motion;Decreased activity tolerance      OT Treatment/Interventions: Self-care/ADL training;Therapeutic exercise;Energy conservation;DME and/or AE instruction;Splinting;Therapeutic activities;Patient/family education;Balance training    OT Goals(Current goals can be found in the care plan section) Acute Rehab OT Goals Patient Stated Goal: To strengthen arms OT Goal Formulation: With patient Time For Goal Achievement: 11/24/20 Potential to Achieve Goals: Good ADL Goals Pt Will Perform Grooming: with set-up;sitting (sitting EOB) Pt Will Perform Upper Body Dressing: with set-up;sitting  OT Frequency: Min 1X/week    AM-PAC OT "6 Clicks" Daily Activity     Outcome Measure Help from another person eating meals?: None Help from another person taking care of personal grooming?: A Little Help from another person toileting, which includes using toliet, bedpan, or urinal?: A Lot Help from another person bathing (including washing, rinsing, drying)?: A Lot Help from another person to  put on and taking off regular upper body clothing?: A Lot Help from another person to put on and taking off regular lower body clothing?: Total 6 Click Score: 14   End of Session Nurse Communication: Mobility status  Activity Tolerance: Patient tolerated treatment well Patient left: in bed;with call bell/phone within reach;with bed alarm set  OT Visit Diagnosis: Muscle weakness (generalized) (M62.81)                Time: 9485-4627 OT Time Calculation (min): 32 min Charges:  OT General Charges $OT Visit: 1 Visit OT Evaluation $OT Eval Moderate Complexity: 1 Mod OT Treatments $Therapeutic Activity: 8-22 mins  Matthew Folks, OTR/L [facility] [phone]

## 2020-11-10 NOTE — TOC Initial Note (Signed)
Transition of Care [facility]) - Initial/Assessment Note    Patient Details  Name: [name] MRN: [id number] Date of Birth: [birth year]  Transition of Care [facility] CM/SW Contact:    Chapman Fitch, RN Phone Number: 11/10/2020, 12:13 PM  Clinical Narrative:                  Patient admitted from home  HX of MS Lives at home with mother   PCP Anne Hahn  Denies issues with transportation or obtaining medications  Patient has WC, lift chair, and hospital bed  Patient has PCS services 6 days a week.  5 hours a day Patient agreeable to home health services. Request [facility]. Referral made to [facility] with [facility]  EMS packet on chart.  EMS transport called  Expected Discharge Plan: Home w Home Health Services Barriers to Discharge: No Barriers Identified   Patient Goals and CMS Choice        Expected Discharge Plan and Services Expected Discharge Plan: Home w Home Health Services   Discharge Planning Services: CM Consult   Living arrangements for the past 2 months: Single Family Home Expected Discharge Date: 11/10/20                         HH Arranged: RN,OT HH Agency: [facility] Date HH Agency Contacted: 11/10/20   Representative spoke with at [facility]  Prior Living Arrangements/Services Living arrangements for the past 2 months: Single Family Home Lives with:: Parents Patient language and need for interpreter reviewed:: Yes Do you feel safe going back to the place where you live?: Yes      Need for Family Participation in Patient Care: Yes (Comment) Care giver support system in place?: Yes (comment) Current home services: DME Criminal Activity/Legal Involvement Pertinent to Current Situation/Hospitalization: No - Comment as needed  Activities of Daily Living Home Assistive Devices/Equipment: Hoyer Lift ADL Screening (condition at time of admission) Patient's cognitive ability adequate to safely  complete daily activities?: No Is the patient deaf or have difficulty hearing?: No Does the patient have difficulty seeing, even when wearing glasses/contacts?: No Does the patient have difficulty concentrating, remembering, or making decisions?: No Patient able to express need for assistance with ADLs?: Yes Does the patient have difficulty dressing or bathing?: Yes Independently performs ADLs?: No Dressing (OT): Needs assistance Is this a change from baseline?: Pre-admission baseline Grooming: Needs assistance Is this a change from baseline?: Pre-admission baseline Feeding: Independent Bathing: Needs assistance Is this a change from baseline?: Pre-admission baseline Toileting: Dependent Is this a change from baseline?: Pre-admission baseline In/Out Bed: Dependent Is this a change from baseline?: Pre-admission baseline Walks in Home: Dependent Is this a change from baseline?: Pre-admission baseline Does the patient have difficulty walking or climbing stairs?: Yes Weakness of Legs: Both Weakness of Arms/Hands: Left  Permission Sought/Granted                  Emotional Assessment       Orientation: : Oriented to Self,Oriented to Place,Oriented to  Time,Oriented to Situation   Psych Involvement: No (comment)  Admission diagnosis:  Lower urinary tract infectious disease [N39.0] Weakness [R53.1] Complicated UTI (urinary tract infection) [N39.0] Patient Active Problem List   Diagnosis Date Noted  . Altered mental status 04/10/2017  . Sepsis (HCC) 01/12/2016  . Lower urinary tract infectious disease 08/09/2015  . Exacerbation of multiple sclerosis (HCC) 08/09/2015  . 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 Multiple sclerosis (HCC)   . Neuropathic pain 05/21/2015  . 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 History of spinal surgery 04/29/2015  . Edema leg 09/05/2014  . Adiposity 09/05/2014  . Abdominal wall hernia  09/05/2014  . 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 Neurogenic bowel 08/12/2012  . Infection of urinary tract 04/07/2006  . 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 [place] [address] Phone: [phone] Fax: [phone]  [facility] [phone] - [place], [place] - [address] [place] [address] Phone: [phone] Fax: [phone]  [facility] [address], [place] - [address] [place] [address] Phone: [phone] Fax: [phone]     Social Determinants of Health (SDOH) Interventions    Readmission Risk Interventions No flowsheet data found.

## 2020-11-10 NOTE — Discharge Instructions (Signed)
Pt to f/u her Neurology NP in [facility] on her appt

## 2020-11-10 NOTE — Progress Notes (Signed)
[name] to be D/C'd home per MD order.  Discussed prescriptions and follow up appointments with the patient. Prescriptions given to patient, medication list explained in detail. Pt verbalized understanding.  Allergies as of 11/10/2020       Reactions   Latex Swelling, Other (See Comments)   Reaction:  Facial swelling         Medication List     STOP taking these medications    Victoza 18 MG/3ML Sopn Generic drug: liraglutide       TAKE these medications    acetaminophen 500 MG tablet Commonly known as: TYLENOL Take 500 mg by mouth every 6 (six) hours as needed.   amantadine 100 MG capsule Commonly known as: SYMMETREL Take 100 mg by mouth 2 (two) times daily.   baclofen 20 MG tablet Commonly known as: LIORESAL Take 20 mg by mouth 2 (two) times daily.   cephALEXin 500 MG capsule Commonly known as: KEFLEX Take 1 capsule (500 mg total) by mouth 3 (three) times daily for 4 days.   cholecalciferol 1000 units tablet Commonly known as: VITAMIN D Take 1,000 Units by mouth daily.   docusate sodium 100 MG capsule Commonly known as: COLACE Take 100 mg by mouth 2 (two) times daily as needed for mild constipation.   DULoxetine 30 MG capsule Commonly known as: CYMBALTA Take 30 mg by mouth 2 (two) times daily.   gabapentin 800 MG tablet Commonly known as: NEURONTIN Take 800 mg by mouth 3 (three) times daily.   lactulose 10 GM/15ML solution Commonly known as: CHRONULAC Take 15 mLs (10 g total) by mouth daily as needed for mild constipation or severe constipation.   polyethylene glycol 17 g packet Commonly known as: MIRALAX / GLYCOLAX Take 17 g by mouth daily.   senna-docusate 8.6-50 MG tablet Commonly known as: Senokot-S Take 2 tablets by mouth at bedtime.        Vitals:   11/10/20 0819 11/10/20 1154  BP: (!) 129/58 117/67  Pulse: 87 94  Resp: 18 18  Temp: 98.8 F (37.1 C) 98.7 F (37.1 C)  SpO2: 100% 100%    Skin clean, dry and intact without  evidence of skin break down, no evidence of skin tears noted. IV catheter discontinued intact. Site without signs and symptoms of complications. Dressing and pressure applied. Pt denies pain at this time. No complaints noted.  An After Visit Summary was printed and given to the patient. Patient escorted via WC, and D/C home via EMS  [place] 

## 2020-11-10 NOTE — Discharge Summary (Signed)
[facility] [place] [facility]   PATIENT NAME: [name]    MR#:  [id number]  DATE OF BIRTH:  March 16, 1971  DATE OF ADMISSION:  11/07/2020 ADMITTING PHYSICIAN: Jacques Navy, MD  DATE OF DISCHARGE: 11/10/2020  PRIMARY CARE PHYSICIAN: System, Provider Not In    ADMISSION DIAGNOSIS:  Lower urinary tract infectious disease [N39.0] Weakness [R53.1] Complicated UTI (urinary tract infection) [N39.0]  DISCHARGE DIAGNOSIS:  Sepsis on admission--resolved UTI Progressive weakness of UE due to MS int he setting of UTI  SECONDARY DIAGNOSIS:   Past Medical History:  Diagnosis Date  . Abdominal wall hernia 09/05/2014  . 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 Edema leg 09/05/2014  . 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 MS (multiple sclerosis) (HCC) 2005  . 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 Neurogenic bowel 08/12/2012  . Neuropathic pain 05/21/2015    HOSPITAL COURSE:   Samantha Corella Mooreis a 50 y.o.femalewith medical history significant ofprimary progressive MS with last OV [facility] 09/10/20, bladder neurogenesis with urostomy, who was in her usual state of health until day of admission when she had sudden onset of increased weakness, loosing the use of her arms. She has had similar episodes in the past associated with infection.  Sepsis POA due to Acute cystitis/UTI  - patient with neurogenic bladder and urostomy with a history of UTI and h/o sepsis in the past now with leukocytosis and positive UA -sepsis improving - patient presented with sudden onset weakness in both upper extremity, tachycardia, elevated white count and abnormal UA. -- cont Ceftriaxone 1 g IV q 24 -- change to oral antibiotic today --Urine Cx multiple species --BC 1/2 GPC staph species--suspect contamination --receivedIV hydration --prn Anti-emetics -- lactic acid--1.1   Progressive primary MS - patient has close outpatient  follow up with [facility] NP/Neurlogy - spoke by video with her NP provider on Monday. No indication for MRI at this time - she is known to have progressive plaques. -- At baseline patient is bedbound. She has a power wheelchair. She is able to use her upper extremities normally however at present has significant weakness left more than right in the setting of infection. Patient has had this before. -Bilateral upper extremity weakness improved. --PT recommend HHPT  Leukocytosis/Tachycardia likely secondary to UTI -18K--19K--10K no fever --no fever  Anion gap acidosis suspected due to sepsis -- received IV fluids.   DVT prophylaxis:lovenox Code Status:full code Family Communication: sister [name] on the phone Disposition Plan:home today Consults called:none Admission status:  Level of care: Med-Surg Status is: Inpatient   Patient continues to improve.d/c home D/w brother [name] in the room CONSULTS OBTAINED:    DRUG ALLERGIES:   Allergies  Allergen Reactions  . Latex Swelling and Other (See Comments)    Reaction:  Facial swelling     DISCHARGE MEDICATIONS:   Allergies as of 11/10/2020      Reactions   Latex Swelling, Other (See Comments)   Reaction:  Facial swelling       Medication List    STOP taking these medications   Victoza 18 MG/3ML Sopn Generic drug: liraglutide     TAKE these medications   acetaminophen 500 MG tablet Commonly known as: TYLENOL Take 500 mg by mouth every 6 (six) hours as needed.   amantadine 100 MG capsule Commonly known as: SYMMETREL Take 100 mg by mouth 2 (two) times daily.   baclofen 20 MG tablet Commonly known as: LIORESAL Take 20 mg  by mouth 2 (two) times daily.   cephALEXin 500 MG capsule Commonly known as: KEFLEX Take 1 capsule (500 mg total) by mouth 3 (three) times daily for 4 days.   cholecalciferol 1000 units tablet Commonly known as: VITAMIN D Take 1,000 Units by mouth daily.   docusate sodium 100 MG  capsule Commonly known as: COLACE Take 100 mg by mouth 2 (two) times daily as needed for mild constipation.   DULoxetine 30 MG capsule Commonly known as: CYMBALTA Take 30 mg by mouth 2 (two) times daily.   gabapentin 800 MG tablet Commonly known as: NEURONTIN Take 800 mg by mouth 3 (three) times daily.   lactulose 10 GM/15ML solution Commonly known as: CHRONULAC Take 15 mLs (10 g total) by mouth daily as needed for mild constipation or severe constipation.   polyethylene glycol 17 g packet Commonly known as: MIRALAX / GLYCOLAX Take 17 g by mouth daily.   senna-docusate 8.6-50 MG tablet Commonly known as: Senokot-S Take 2 tablets by mouth at bedtime.       If you experience worsening of your admission symptoms, develop shortness of breath, life threatening emergency, suicidal or homicidal thoughts you must seek medical attention immediately by calling 911 or calling your MD immediately  if symptoms less severe.  You Must read complete instructions/literature along with all the possible adverse reactions/side effects for all the Medicines you take and that have been prescribed to you. Take any new Medicines after you have completely understood and accept all the possible adverse reactions/side effects.   Please note  You were cared for by a hospitalist during your hospital stay. If you have any questions about your discharge medications or the care you received while you were in the hospital after you are discharged, you can call the unit and asked to speak with the hospitalist on call if the hospitalist that took care of you is not available. Once you are discharged, your primary care physician will handle any further medical issues. Please note that NO REFILLS for any discharge medications will be authorized once you are discharged, as it is imperative that you return to your primary care physician (or establish a relationship with a primary care physician if you do not have one) for  your aftercare needs so that they can reassess your need for medications and monitor your lab values. Today   SUBJECTIVE   Doing well. Bilateral UE weakness improving No fever  VITAL SIGNS:  Blood pressure (!) 117/49, pulse 92, temperature 97.6 F (36.4 C), temperature source Oral, resp. rate 16, height 5\' 4"  (1.626 m), weight 102.4 kg, SpO2 100 %.  I/O:    Intake/Output Summary (Last 24 hours) at 11/10/2020 0801 Last data filed at 11/09/2020 2000 Gross per 24 hour  Intake 1230.46 ml  Output 2000 ml  Net -769.54 ml    PHYSICAL EXAMINATION:  GENERAL:  50 y.o.-year-old patient lying in the bed with no acute distress.  Obese LUNGS: Normal breath sounds bilaterally, no wheezing, rales,rhonchi or crepitation. No use of accessory muscles of respiration.  CARDIOVASCULAR: S1, S2 normal. No murmurs, rubs, or gallops.  ABDOMEN: Soft, non-tender, non-distended. Bowel sounds present. No organomegaly or mass.  EXTREMITIES: No pedal edema, cyanosis, or clubbing.  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  SKIN: No obvious rash, lesion, or ulcer.   DATA REVIEW:   CBC  Recent Labs  Lab 11/10/20 0458  WBC 10.1  HGB 12.6  HCT 38.2  PLT 276    Chemistries  Recent Labs  Lab 11/07/20 1949 11/10/20 0458  NA 138 137  K 3.7 3.9  CL 103 106  CO2 19* 24  GLUCOSE 73 106*  BUN 12 7  CREATININE 0.49 0.60  CALCIUM 9.1 9.1  AST 17  --   ALT 13  --   ALKPHOS 56  --   BILITOT 0.8  --     Microbiology Results   Recent Results (from the past 240 hour(s))  Resp Panel by RT-PCR (Flu A&B, Covid) Nasopharyngeal Swab     Status: None   Collection Time: 11/07/20  7:35 PM   Specimen: Nasopharyngeal Swab; Nasopharyngeal(NP) swabs in vial transport medium  Result Value Ref Range Status   SARS Coronavirus 2 by RT PCR NEGATIVE NEGATIVE Final    Comment: (NOTE) SARS-CoV-2 target nucleic acids are NOT DETECTED.  The  SARS-CoV-2 RNA is generally detectable in upper respiratory specimens during the acute phase of infection. The lowest concentration of SARS-CoV-2 viral copies this assay can detect is 138 copies/mL. A negative result does not preclude SARS-Cov-2 infection and should not be used as the sole basis for treatment or other patient management decisions. A negative result may occur with  improper specimen collection/handling, submission of specimen other than nasopharyngeal swab, presence of viral mutation(s) within the areas targeted by this assay, and inadequate number of viral copies(<138 copies/mL). A negative result must be combined with clinical observations, patient history, and epidemiological information. The expected result is Negative.  Fact Sheet for Patients:  BloggerCourse.com  Fact Sheet for Healthcare Providers:  SeriousBroker.it  This test is no t yet approved or cleared by the [place] FDA and  has been authorized for detection and/or diagnosis of SARS-CoV-2 by FDA under an Emergency Use Authorization (EUA). This EUA will remain  in effect (meaning this test can be used) for the duration of the COVID-19 declaration under Section 564(b)(1) of the Act, 21 U.S.C.section 360bbb-3(b)(1), unless the authorization is terminated  or revoked sooner.       Influenza A by PCR NEGATIVE NEGATIVE Final   Influenza B by PCR NEGATIVE NEGATIVE Final    Comment: (NOTE) The Xpert Xpress SARS-CoV-2/FLU/RSV plus assay is intended as an aid in the diagnosis of influenza from Nasopharyngeal swab specimens and should not be used as a sole basis for treatment. Nasal washings and aspirates are unacceptable for Xpert Xpress SARS-CoV-2/FLU/RSV testing.  Fact Sheet for Patients: BloggerCourse.com  Fact Sheet for Healthcare Providers: SeriousBroker.it  This test is not yet approved or  cleared by the [place] FDA and has been authorized for detection and/or diagnosis of SARS-CoV-2 by FDA under an Emergency Use Authorization (EUA). This EUA will remain in effect (meaning this test can be used) for the duration of the COVID-19 declaration under Section 564(b)(1) of the Act, 21 U.S.C. section 360bbb-3(b)(1), unless the authorization is terminated or revoked.  Performed at [facility], [address], [place], [place] [address]   Urine Culture     Status: Abnormal   Collection Time: 11/07/20 10:26 PM   Specimen: Urine, Clean Catch  Result Value Ref Range Status   Specimen Description   Final    URINE, CLEAN CATCH Performed at [facility], [address], [place], [place] [address]    Special Requests   Final    NONE Performed at [facility], [address], [place], [place] [address]    Culture MULTIPLE SPECIES PRESENT, SUGGEST RECOLLECTION (  A)  Final   Report Status 11/09/2020 FINAL  Final  Culture, blood (single) w Reflex to ID Panel     Status: None (Preliminary result)   Collection Time: 11/08/20  3:01 PM   Specimen: BLOOD  Result Value Ref Range Status   Specimen Description BLOOD BLOOD RIGHT HAND  Final   Special Requests   Final    BOTTLES DRAWN AEROBIC AND ANAEROBIC Blood Culture adequate volume   Culture  Setup Time   Final    Organism ID to follow AEROBIC BOTTLE ONLY GRAM POSITIVE COCCI CRITICAL RESULT CALLED TO, READ BACK BY AND VERIFIED WITH: ABBY ELLINGTON 11/09/20 AT 1854 BY ACR Performed at [facility], [address] [place], [place] [address]    Culture GRAM POSITIVE COCCI  Final   Report Status PENDING  Incomplete  Blood Culture ID Panel (Reflexed)     Status: Abnormal   Collection Time: 11/08/20  3:01 PM  Result Value Ref Range Status   Enterococcus faecalis NOT DETECTED NOT DETECTED Final   Enterococcus Faecium NOT DETECTED NOT DETECTED Final   Listeria monocytogenes NOT DETECTED NOT  DETECTED Final   Staphylococcus species DETECTED (A) NOT DETECTED Final    Comment: CRITICAL RESULT CALLED TO, READ BACK BY AND VERIFIED WITH: ABBY ELLINGTON 11/09/20 AT 1854 BY ACR    Staphylococcus aureus (BCID) NOT DETECTED NOT DETECTED Final   Staphylococcus epidermidis NOT DETECTED NOT DETECTED Final   Staphylococcus lugdunensis NOT DETECTED NOT DETECTED Final   Streptococcus species NOT DETECTED NOT DETECTED Final   Streptococcus agalactiae NOT DETECTED NOT DETECTED Final   Streptococcus pneumoniae NOT DETECTED NOT DETECTED Final   Streptococcus pyogenes NOT DETECTED NOT DETECTED Final   A.calcoaceticus-baumannii NOT DETECTED NOT DETECTED Final   Bacteroides fragilis NOT DETECTED NOT DETECTED Final   Enterobacterales NOT DETECTED NOT DETECTED Final   Enterobacter cloacae complex NOT DETECTED NOT DETECTED Final   Escherichia coli NOT DETECTED NOT DETECTED Final   Klebsiella aerogenes NOT DETECTED NOT DETECTED Final   Klebsiella oxytoca NOT DETECTED NOT DETECTED Final   Klebsiella pneumoniae NOT DETECTED NOT DETECTED Final   Proteus species NOT DETECTED NOT DETECTED Final   Salmonella species NOT DETECTED NOT DETECTED Final   Serratia marcescens NOT DETECTED NOT DETECTED Final   Haemophilus influenzae NOT DETECTED NOT DETECTED Final   Neisseria meningitidis NOT DETECTED NOT DETECTED Final   Pseudomonas aeruginosa NOT DETECTED NOT DETECTED Final   Stenotrophomonas maltophilia NOT DETECTED NOT DETECTED Final   Candida albicans NOT DETECTED NOT DETECTED Final   Candida auris NOT DETECTED NOT DETECTED Final   Candida glabrata NOT DETECTED NOT DETECTED Final   Candida krusei NOT DETECTED NOT DETECTED Final   Candida parapsilosis NOT DETECTED NOT DETECTED Final   Candida tropicalis NOT DETECTED NOT DETECTED Final   Cryptococcus neoformans/gattii NOT DETECTED NOT DETECTED Final    Comment: Performed at [facility], [address] [place], [place] [address]    RADIOLOGY:   No results found.   CODE STATUS:     Code Status Orders  (From admission, onward)         Start     Ordered   11/07/20 2253  Full code  Continuous        11/07/20 2253        Code Status History    Date Active Date Inactive Code Status Order ID Comments User Context   07/24/2018 2240 07/26/2018 1849 Full Code [id number]  Houston SirenSainani, Vivek J, MD Inpatient  04/11/2017 0109 04/14/2017 1912 Full Code [id number]  Altamese Dilling, MD Inpatient   01/20/2017 0603 01/24/2017 1843 Full Code [id number]  Ihor Austin, MD ED   01/12/2016 1648 01/15/2016 1824 Full Code [id number]  Houston Siren, MD ED   08/09/2015 1724 08/12/2015 2135 Full Code [id number]  Marguarite Arbour, MD Inpatient   07/19/2015 1454 07/25/2015 1351 Full Code [id number]  Gladis Riffle, MD Inpatient   Advance Care Planning Activity       TOTAL TIME TAKING CARE OF THIS PATIENT: *35* minutes.    [name] M.D  [facility]    CC: Primary care physician; System, Provider Not In

## 2020-11-12 LAB — CULTURE, BLOOD (SINGLE): Special Requests: ADEQUATE

## 2020-12-03 ENCOUNTER — Encounter
Admit: 2020-12-03 | Discharge: 2020-12-04 | Payer: MEDICARE | Attending: Student in an Organized Health Care Education/Training Program | Primary: Student in an Organized Health Care Education/Training Program

## 2020-12-03 DIAGNOSIS — K592 Neurogenic bowel, not elsewhere classified: Principal | ICD-10-CM

## 2020-12-03 DIAGNOSIS — M792 Neuralgia and neuritis, unspecified: Principal | ICD-10-CM

## 2020-12-03 DIAGNOSIS — Z9889 Other specified postprocedural states: Principal | ICD-10-CM

## 2020-12-03 DIAGNOSIS — G35 Multiple sclerosis: Principal | ICD-10-CM

## 2020-12-03 DIAGNOSIS — G8220 Paraplegia, unspecified: Principal | ICD-10-CM

## 2020-12-03 DIAGNOSIS — R600 Localized edema: Principal | ICD-10-CM

## 2020-12-03 DIAGNOSIS — E669 Obesity, unspecified: Principal | ICD-10-CM

## 2020-12-03 MED ORDER — SENNOSIDES 8.6 MG-DOCUSATE SODIUM 50 MG TABLET
ORAL_TABLET | Freq: Every evening | ORAL | 11 refills | 15 days | Status: CP
Start: 2020-12-03 — End: 2021-12-03

## 2020-12-29 DIAGNOSIS — G35 Multiple sclerosis: Principal | ICD-10-CM

## 2021-01-05 DIAGNOSIS — G35 Multiple sclerosis: Principal | ICD-10-CM

## 2021-01-06 MED ORDER — BACLOFEN 20 MG TABLET
ORAL_TABLET | 0 refills | 0 days | Status: CP
Start: 2021-01-06 — End: ?

## 2021-01-06 MED ORDER — GABAPENTIN 800 MG TABLET
ORAL_TABLET | Freq: Three times a day (TID) | ORAL | 1 refills | 120.00000 days | Status: CP
Start: 2021-01-06 — End: ?

## 2021-01-20 ENCOUNTER — Ambulatory Visit: Admit: 2021-01-20 | Discharge: 2021-01-21 | Payer: MEDICARE | Attending: Obesity Medicine | Primary: Obesity Medicine

## 2021-01-20 DIAGNOSIS — Z9889 Other specified postprocedural states: Principal | ICD-10-CM

## 2021-01-20 DIAGNOSIS — N3080 Other cystitis without hematuria: Principal | ICD-10-CM

## 2021-01-20 DIAGNOSIS — D7218 Eosinophilic cystitis: Principal | ICD-10-CM

## 2021-01-20 DIAGNOSIS — E669 Obesity, unspecified: Principal | ICD-10-CM

## 2021-01-20 DIAGNOSIS — G35 Multiple sclerosis: Principal | ICD-10-CM

## 2021-01-20 MED ORDER — SEMAGLUTIDE 0.25 MG OR 0.5 MG (2 MG/1.5 ML) SUBCUTANEOUS PEN INJECTOR
SUBCUTANEOUS | 3 refills | 42 days | Status: CP
Start: 2021-01-20 — End: 2021-03-17

## 2021-02-24 ENCOUNTER — Encounter: Admit: 2021-02-24 | Discharge: 2021-02-24 | Payer: MEDICARE

## 2021-02-24 DIAGNOSIS — Z1231 Encounter for screening mammogram for malignant neoplasm of breast: Principal | ICD-10-CM

## 2021-02-24 DIAGNOSIS — G35 Multiple sclerosis: Principal | ICD-10-CM

## 2021-02-25 ENCOUNTER — Encounter: Admit: 2021-02-25 | Discharge: 2021-02-25 | Payer: MEDICARE

## 2021-03-04 MED ORDER — BACLOFEN 20 MG TABLET
ORAL_TABLET | 0 refills | 0 days | Status: CP
Start: 2021-03-04 — End: ?

## 2021-03-11 ENCOUNTER — Encounter
Admit: 2021-03-11 | Discharge: 2021-03-12 | Payer: MEDICARE | Attending: Student in an Organized Health Care Education/Training Program | Primary: Student in an Organized Health Care Education/Training Program

## 2021-03-11 DIAGNOSIS — G35 Multiple sclerosis: Principal | ICD-10-CM

## 2021-03-11 DIAGNOSIS — R600 Localized edema: Principal | ICD-10-CM

## 2021-03-11 DIAGNOSIS — E669 Obesity, unspecified: Principal | ICD-10-CM

## 2021-03-11 MED ORDER — CHOLECALCIFEROL (VITAMIN D3) 10 MCG (400 UNIT) CHEWABLE TABLET
ORAL_TABLET | Freq: Every day | ORAL | 11 refills | 0.00000 days | Status: CP
Start: 2021-03-11 — End: 2022-03-11

## 2021-03-18 MED ORDER — AMANTADINE HCL 100 MG CAPSULE
ORAL_CAPSULE | Freq: Two times a day (BID) | ORAL | 3 refills | 30 days | Status: CP
Start: 2021-03-18 — End: ?

## 2021-04-14 ENCOUNTER — Ambulatory Visit: Admit: 2021-04-14 | Discharge: 2021-04-15 | Payer: MEDICARE | Attending: Urology | Primary: Urology

## 2021-04-20 DIAGNOSIS — G35 Multiple sclerosis: Principal | ICD-10-CM

## 2021-04-20 MED ORDER — DULOXETINE 30 MG CAPSULE,DELAYED RELEASE
ORAL_CAPSULE | Freq: Every day | ORAL | 3 refills | 90 days | Status: CP
Start: 2021-04-20 — End: 2022-03-11

## 2021-04-29 ENCOUNTER — Encounter: Admit: 2021-04-29 | Discharge: 2021-04-29 | Payer: MEDICARE

## 2021-04-29 DIAGNOSIS — K5909 Other constipation: Principal | ICD-10-CM

## 2021-04-29 DIAGNOSIS — E785 Hyperlipidemia, unspecified: Principal | ICD-10-CM

## 2021-04-29 DIAGNOSIS — Z1211 Encounter for screening for malignant neoplasm of colon: Principal | ICD-10-CM

## 2021-04-29 DIAGNOSIS — G35 Multiple sclerosis: Principal | ICD-10-CM

## 2021-04-29 DIAGNOSIS — E669 Obesity, unspecified: Principal | ICD-10-CM

## 2021-04-29 MED ORDER — OZEMPIC 1 MG/DOSE (2 MG/1.5 ML) SUBCUTANEOUS PEN INJECTOR
SUBCUTANEOUS | 2 refills | 28 days | Status: CP
Start: 2021-04-29 — End: ?

## 2021-05-04 ENCOUNTER — Encounter: Admit: 2021-05-04 | Discharge: 2021-05-05 | Payer: MEDICARE

## 2021-05-14 DIAGNOSIS — G35 Multiple sclerosis: Principal | ICD-10-CM

## 2021-05-14 MED ORDER — BACLOFEN 20 MG TABLET
ORAL_TABLET | Freq: Three times a day (TID) | ORAL | 0 refills | 30 days | Status: CP
Start: 2021-05-14 — End: ?

## 2021-06-04 ENCOUNTER — Ambulatory Visit: Admit: 2021-06-04 | Discharge: 2021-06-05 | Payer: MEDICARE

## 2021-06-17 ENCOUNTER — Ambulatory Visit: Admit: 2021-06-17 | Discharge: 2021-06-17 | Payer: MEDICARE

## 2021-06-17 DIAGNOSIS — K5909 Other constipation: Principal | ICD-10-CM

## 2021-06-17 DIAGNOSIS — G35 Multiple sclerosis: Principal | ICD-10-CM

## 2021-06-17 DIAGNOSIS — E669 Obesity, unspecified: Principal | ICD-10-CM

## 2021-06-17 DIAGNOSIS — M79604 Pain in right leg: Principal | ICD-10-CM

## 2021-06-17 MED ORDER — GABAPENTIN 100 MG CAPSULE
ORAL_CAPSULE | Freq: Every day | ORAL | 3 refills | 135 days | Status: CP
Start: 2021-06-17 — End: ?

## 2021-06-17 MED ORDER — SENNOSIDES 8.6 MG-DOCUSATE SODIUM 50 MG TABLET
ORAL_TABLET | Freq: Every evening | ORAL | 11 refills | 30 days | Status: CP
Start: 2021-06-17 — End: 2022-06-17

## 2021-06-18 DIAGNOSIS — G35 Multiple sclerosis: Principal | ICD-10-CM

## 2021-06-18 MED ORDER — GABAPENTIN 800 MG TABLET
ORAL_TABLET | Freq: Every evening | ORAL | 5 refills | 60 days | Status: CP
Start: 2021-06-18 — End: ?

## 2021-06-18 MED ORDER — BACLOFEN 20 MG TABLET
ORAL_TABLET | Freq: Two times a day (BID) | ORAL | 0 refills | 45 days | Status: CP
Start: 2021-06-18 — End: ?

## 2021-07-08 DIAGNOSIS — G35 Multiple sclerosis: Principal | ICD-10-CM

## 2021-07-08 MED ORDER — GABAPENTIN 800 MG TABLET
ORAL_TABLET | Freq: Every evening | ORAL | 5 refills | 60 days | Status: CP
Start: 2021-07-08 — End: ?

## 2021-07-19 ENCOUNTER — Telehealth
Admit: 2021-07-19 | Discharge: 2021-07-20 | Payer: MEDICARE | Attending: Physician Assistant | Primary: Physician Assistant

## 2021-07-19 DIAGNOSIS — G35 Multiple sclerosis: Principal | ICD-10-CM

## 2021-07-23 MED ORDER — AMANTADINE HCL 100 MG CAPSULE
ORAL_CAPSULE | Freq: Two times a day (BID) | ORAL | 3 refills | 30 days | Status: CP
Start: 2021-07-23 — End: ?

## 2021-08-17 DIAGNOSIS — G35 Multiple sclerosis: Principal | ICD-10-CM

## 2021-08-17 MED ORDER — VUMERITY 231 MG CAPSULE,DELAYED RELEASE
ORAL_CAPSULE | ORAL | 2 refills | 0 days | Status: CP
Start: 2021-08-17 — End: 2021-10-23
  Filled 2021-08-25: qty 120, 33d supply, fill #0

## 2021-08-23 DIAGNOSIS — G35 Multiple sclerosis: Principal | ICD-10-CM

## 2021-08-23 MED ORDER — BACLOFEN 20 MG TABLET
ORAL_TABLET | Freq: Two times a day (BID) | ORAL | 0 refills | 45.00000 days | Status: CP
Start: 2021-08-23 — End: ?

## 2021-08-23 NOTE — Unmapped (Signed)
[facility] Specialty Medication Onboarding    Specialty Medication: Vumerity   Prior Authorization: Approved   Financial Assistance: No - copay  <$25  Final Copay/Day Supply: $0 / 30    Insurance Restrictions: None     Notes to Pharmacist:     The triage team has completed the benefits investigation and has determined that the patient is able to fill this medication at [facility]. Please contact the patient to complete the onboarding or follow up with the prescribing physician as needed.

## 2021-08-23 NOTE — Unmapped (Signed)
Refill request for Baclofen. Patient last seen by PCP on 09.22.22. Routing to [name], MD

## 2021-08-24 NOTE — Unmapped (Signed)
Provided refill for baclofen - takes chronically with her MS.

## 2021-08-24 NOTE — Unmapped (Signed)
[facility]   Patient Onboarding/Medication Counseling    Katie Mullen is a 50 y.o. female with multiple sclerosis who I am counseling today on initiation of therapy.  I am speaking to the patient.    Was a Nurse, learning disability used for this call? No    Verified patient's date of birth / HIPAA.    Specialty medication(s) to be sent: Neurology: Vumerity      Non-specialty medications/supplies to be sent: none      Medications not needed at this time: n/a         Vumerity (diroximel fumarate)    Medication & Administration     Dosage: Multiple sclerosis: Take 1 capsule (231mg ) by mouth twice daily for 7 days then take 2 capsules (462mg ) by mouth twice daily thereafter.    Administration: Take by mouth without regard to meals. If taken with food limit fat and calories to <30g and <700 calories, respectively. Swallow capsule whole, do not chew, crush or open.    Adherence/Missed dose instructions: Take a missed dose as soon you remember. If it is close to the time of your next dose, skip the missed dose and resume your normal schedule.    Lab tests required prior to treatment initiation:  ??? CBC: CBC complete and WNL.  ??? LFTs: LFTs complete and WNL.    Goals of Therapy     ??? Reduce the frequency of flare-ups   ??? Slow disease progression    Side Effects & Monitoring Parameters     Common side effects:  ??? Flushing (may take a non-enteric coated aspirin about 30 minutes prior to dose to help with this)  ??? Upset stomach (nausea, vomiting, diarrhea)  ??? Heartburn     The following side effects should be reported to the provider:  ??? Signs of an allergic reaction  ??? Signs of liver problems (yellowing of the skin/eyes, darkened urine, light-colored stools, severe upset stomach or vomiting)  ??? Signs of PML (confusion, memory problems, low mood, behavioral changes, changes in balance, vision changes, difficulty speaking)  ??? Signs of infection (fever, chills, new or worsening cough, sore throat, etc.)      Contraindications, Warnings, & Precautions     ??? GI events (diarrhea, vomiting, constipation)  ??? Lymphopenia (CBC with diff at start of therapy then every 3 to 6 months as clinically indicated)  ??? Hepatotoxicity (LFTs at start of therapy then as clinically indicated)    Drug/Food Interactions     ??? Medication list reviewed in Epic. The patient was instructed to inform the care team before taking any new medications or supplements. No drug interactions identified.   ??? Alcohol may decrease the serum concentrations of the active metabolite of Vumerity and should be avoided during treatment.    Storage, Handling Precautions, & Disposal   ??? Vumerity should be stored at room temperature.        Current Medications (including OTC/herbals), Comorbidities and Allergies     Current Outpatient Medications   Medication Sig Dispense Refill   ??? acetaminophen (TYLENOL) 500 MG tablet Take 500 mg by mouth every six (6) hours as needed for pain.      ??? amantadine HCL (SYMMETREL) 100 mg capsule Take 1 capsule (100 mg total) by mouth Two (2) times a day. 60 capsule 3   ??? baclofen (LIORESAL) 20 MG tablet Take 1 tablet (20 mg total) by mouth Two (2) times a day. 90 tablet 0   ??? cholecalciferol, vitamin D3, 10 mcg (  400 unit) Chew Chew 3 Doses daily. 30 tablet 11   ??? diroximel fumarate (VUMERITY) 231 mg CpDR Take 1 capsule by mouth two (2) times a day for 7 days, THEN 2 capsules two (2) times a day. 120 capsule 2   ??? DULoxetine (CYMBALTA) 30 MG capsule Take 1 capsule (30 mg total) by mouth in the morning. (Patient taking differently: Take 30 mg by mouth Two (2) times a day.) 90 capsule 3   ??? gabapentin (NEURONTIN) 100 MG capsule Take 2 capsules (200 mg total) by mouth in the morning. 270 capsule 3   ??? gabapentin (NEURONTIN) 800 MG tablet Take 1 tablet (800 mg total) by mouth nightly. Pt will be taking 200mg  po in the morning and 800 mg po a hs-  for a total of 1000mg  a day 60 tablet 5   ??? lactulose (CHRONULAC) 10 gram/15 mL solution Take 15 mL (10 g total) by mouth daily. 240 mL 3   ??? polyethylene glycol (MIRALAX) 17 gram packet Take 17 g by mouth daily. 90 packet 0   ??? semaglutide (OZEMPIC) 1 mg/dose (2 mg/1.5 mL) PnIj Inject 1 mg under the skin every seven (7) days. 3 mL 2   ??? senna-docusate (PERICOLACE) 8.6-50 mg Take 1 tablet by mouth nightly. 30 tablet 11   ??? SURE COMFORT PEN NEEDLE 32 gauge x 5/32 (4 mm) Ndle        No current facility-administered medications for this visit.       Allergies   Allergen Reactions   ??? Latex Swelling       Patient Active Problem List   Diagnosis   ??? MS - Primary Progressive. On Amantidine   ??? 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 Neurogenic bowel   ??? Recurrent MDR UTIs   ??? Obesity (BMI 30.0-34.9)   ??? Eosinophilic cystitis   ??? Parastomal Fat containing hernia   ??? Bilateral lower extremity edema   ??? History of urostomy   ??? Neuropathic pain   ??? Abdominal wall hernia   ??? Risk for falls   ??? Paraplegia (CMS-HCC)   ??? Multiple sclerosis (CMS-HCC)   ??? Diarrhea   ??? Hematuria   ??? Other headache syndrome   ??? Hyperlipidemia       Reviewed and up to date in Epic.    Appropriateness of Therapy     Acute infections noted within Epic:  No active infections  Patient reported infection: None    Is medication and dose appropriate based on diagnosis and infection status? Yes    Prescription has been clinically reviewed: Yes      Baseline Quality of Life Assessment      How many days over the past month did your MS  keep you from your normal activities? For example, brushing your teeth or getting up in the morning. every day    Financial Information     Medication Assistance provided: Prior Authorization    Anticipated copay of $0 reviewed with patient. Verified delivery address.    Delivery Information     Scheduled delivery date: 08/26/21    Expected start date: 08/26/21    Medication will be delivered via Next Day Courier to the prescription address in [facility]  This shipment will not require a signature.      Explained the services we provide at [facility] and that each month we would call to set up refills.  Stressed importance of returning phone calls so that we could ensure they receive their medications in time each month.  Informed patient that we should be setting up refills 7-10 days prior to when they will run out of medication.  A pharmacist will reach out to perform a clinical assessment periodically.  Informed patient that a welcome packet, containing information about our pharmacy and other support services, a Notice of Privacy Practices, and a drug information handout will be sent.      The patient or caregiver noted above participated in the development of this care plan and knows that they can request review of or adjustments to the care plan at any time.      Patient or caregiver verbalized understanding of the above information as well as how to contact the pharmacy at [phone] option 4 with any questions/concerns.  The pharmacy is open Monday through Friday 8:30am-4:30pm.  A pharmacist is available 24/7 via pager to answer any clinical questions they may have.    Patient Specific Needs     - Does the patient have any physical, cognitive, or cultural barriers? No    - Does the patient have adequate living arrangements? (i.e. the ability to store and take their medication appropriately) Yes    - Did you identify any home environmental safety or security hazards? No    - Patient prefers to have medications discussed with  Patient     - Is the patient or caregiver able to read and understand education materials at a high school level or above? Yes    - Patient's primary language is  English     - Is the patient high risk? No    - Does the patient require physician intervention or other additional services (i.e. dietary/nutrition, smoking cessation, social work)? No      Arnold Long  [facility] Specialty Pharmacist

## 2021-08-27 ENCOUNTER — Emergency Department
Admission: EM | Admit: 2021-08-27 | Discharge: 2021-08-28 | Disposition: A | Discharge status: Home / Self Care | Payer: Medicare Other | Attending: Emergency Medicine | Admitting: Emergency Medicine

## 2021-08-27 ENCOUNTER — Other Ambulatory Visit: Payer: Self-pay

## 2021-08-27 ENCOUNTER — Emergency Department: Payer: Medicare Other

## 2021-08-27 DIAGNOSIS — S91311A Laceration without foreign body, right foot, initial encounter: Secondary | ICD-10-CM | POA: Insufficient documentation

## 2021-08-27 DIAGNOSIS — X58XXXA Exposure to other specified factors, initial encounter: Secondary | ICD-10-CM | POA: Diagnosis not present

## 2021-08-27 DIAGNOSIS — S99921A Unspecified injury of right foot, initial encounter: Secondary | ICD-10-CM | POA: Diagnosis present

## 2021-08-27 DIAGNOSIS — Z91040 Latex allergy status: Secondary | ICD-10-CM | POA: Diagnosis not present

## 2021-08-27 MED ORDER — LIDOCAINE HCL (PF) 1 % IJ SOLN
5.0000 mL | Freq: Once | INTRAMUSCULAR | Status: DC
  Filled 2021-08-27: qty 5

## 2021-08-27 MED ORDER — CEPHALEXIN 500 MG PO CAPS
500.0000 mg | ORAL_CAPSULE | Freq: Once | ORAL | Status: AC
  Administered 2021-08-27: 500 mg via ORAL
  Filled 2021-08-27: qty 1

## 2021-08-27 MED ORDER — CEPHALEXIN 500 MG PO CAPS: 500.0000 mg | ORAL_CAPSULE | Freq: Four times a day (QID) | ORAL | 0 refills | Status: DC

## 2021-08-27 NOTE — ED Notes (Signed)
Called EMS for transport back to her home 

## 2021-08-27 NOTE — ED Triage Notes (Signed)
Pt comes into the ED via EMS from home, states she was getting out of her power chair and into her car and realized later her 2nd and 3rd toe on right foot were bleeding and has lacerations noted, pt has MS and has loss of feeling in her LE. VSS

## 2021-08-27 NOTE — Discharge Instructions (Signed)
Please keep laceration site clean and dry for 10 days.  Follow-up with primary care provider, walk-in clinic or urgent care in 10 days for suture removal.  Cleanse laceration site daily with alcohol.  Take antibiotics as prescribed.  Return for any worsening symptoms or urgent changes in health.
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
## 2021-09-02 ENCOUNTER — Other Ambulatory Visit: Payer: Self-pay

## 2021-09-02 ENCOUNTER — Inpatient Hospital Stay
Admission: EM | Admit: 2021-09-02 | Discharge: 2021-09-06 | DRG: 698 | Disposition: A | Discharge status: Home / Self Care | Payer: Medicare Other | Attending: Internal Medicine | Admitting: Internal Medicine

## 2021-09-02 ENCOUNTER — Emergency Department: Payer: Medicare Other

## 2021-09-02 ENCOUNTER — Encounter: Payer: Self-pay | Admitting: Emergency Medicine

## 2021-09-02 DIAGNOSIS — R29898 Other symptoms and signs involving the musculoskeletal system: Secondary | ICD-10-CM | POA: Diagnosis present

## 2021-09-02 DIAGNOSIS — Z90710 Acquired absence of both cervix and uterus: Secondary | ICD-10-CM

## 2021-09-02 DIAGNOSIS — G35 Multiple sclerosis: Secondary | ICD-10-CM | POA: Diagnosis present

## 2021-09-02 DIAGNOSIS — A419 Sepsis, unspecified organism: Secondary | ICD-10-CM | POA: Diagnosis not present

## 2021-09-02 DIAGNOSIS — N99521 Infection of other external stoma of urinary tract: Secondary | ICD-10-CM | POA: Diagnosis not present

## 2021-09-02 DIAGNOSIS — R6520 Severe sepsis without septic shock: Secondary | ICD-10-CM

## 2021-09-02 DIAGNOSIS — Z6841 Body Mass Index (BMI) 40.0 and over, adult: Secondary | ICD-10-CM

## 2021-09-02 DIAGNOSIS — Z87440 Personal history of urinary (tract) infections: Secondary | ICD-10-CM

## 2021-09-02 DIAGNOSIS — N390 Urinary tract infection, site not specified: Secondary | ICD-10-CM | POA: Diagnosis not present

## 2021-09-02 DIAGNOSIS — G8220 Paraplegia, unspecified: Secondary | ICD-10-CM | POA: Diagnosis present

## 2021-09-02 DIAGNOSIS — A4152 Sepsis due to Pseudomonas: Secondary | ICD-10-CM | POA: Diagnosis present

## 2021-09-02 DIAGNOSIS — N319 Neuromuscular dysfunction of bladder, unspecified: Secondary | ICD-10-CM | POA: Diagnosis present

## 2021-09-02 DIAGNOSIS — Z20822 Contact with and (suspected) exposure to covid-19: Secondary | ICD-10-CM | POA: Diagnosis present

## 2021-09-02 DIAGNOSIS — Z91040 Latex allergy status: Secondary | ICD-10-CM

## 2021-09-02 DIAGNOSIS — Z79899 Other long term (current) drug therapy: Secondary | ICD-10-CM

## 2021-09-02 DIAGNOSIS — E876 Hypokalemia: Secondary | ICD-10-CM | POA: Diagnosis present

## 2021-09-02 DIAGNOSIS — E860 Dehydration: Secondary | ICD-10-CM | POA: Diagnosis present

## 2021-09-02 LAB — CBC WITH DIFFERENTIAL/PLATELET
Abs Immature Granulocytes: 0.04 10*3/uL (ref 0.00–0.07)
Basophils Absolute: 0 10*3/uL (ref 0.0–0.1)
Basophils Relative: 0 %
Eosinophils Absolute: 0.1 10*3/uL (ref 0.0–0.5)
Eosinophils Relative: 1 %
HCT: 43.1 % (ref 36.0–46.0)
Hemoglobin: 13.9 g/dL (ref 12.0–15.0)
Immature Granulocytes: 0 %
Lymphocytes Relative: 15 %
Lymphs Abs: 1.4 10*3/uL (ref 0.7–4.0)
MCH: 29.2 pg (ref 26.0–34.0)
MCHC: 32.3 g/dL (ref 30.0–36.0)
MCV: 90.5 fL (ref 80.0–100.0)
Monocytes Absolute: 0.9 10*3/uL (ref 0.1–1.0)
Monocytes Relative: 9 %
Neutro Abs: 7.2 10*3/uL (ref 1.7–7.7)
Neutrophils Relative %: 75 %
Platelets: 358 10*3/uL (ref 150–400)
RBC: 4.76 MIL/uL (ref 3.87–5.11)
RDW: 14.2 % (ref 11.5–15.5)
WBC: 9.6 10*3/uL (ref 4.0–10.5)
nRBC: 0 % (ref 0.0–0.2)

## 2021-09-02 LAB — COMPREHENSIVE METABOLIC PANEL
ALT: 13 U/L (ref 0–44)
AST: 19 U/L (ref 15–41)
Albumin: 3.4 g/dL — ABNORMAL LOW (ref 3.5–5.0)
Alkaline Phosphatase: 41 U/L (ref 38–126)
Anion gap: 11 (ref 5–15)
BUN: 10 mg/dL (ref 6–20)
CO2: 22 mmol/L (ref 22–32)
Calcium: 8.7 mg/dL — ABNORMAL LOW (ref 8.9–10.3)
Chloride: 104 mmol/L (ref 98–111)
Creatinine, Ser: 0.51 mg/dL (ref 0.44–1.00)
GFR, Estimated: >60 mL/min (ref >60)
Glucose, Bld: 105 mg/dL — ABNORMAL HIGH (ref 70–99)
Potassium: 3.6 mmol/L (ref 3.5–5.1)
Sodium: 137 mmol/L (ref 135–145)
Total Bilirubin: 1 mg/dL (ref 0.3–1.2)
Total Protein: 7.5 g/dL (ref 6.5–8.1)

## 2021-09-02 LAB — URINALYSIS, COMPLETE (UACMP) WITH MICROSCOPIC
Bacteria, UA: NONE SEEN
Bilirubin Urine: NEGATIVE
Glucose, UA: NEGATIVE mg/dL
Ketones, ur: 80 mg/dL — AB
Nitrite: POSITIVE — AB
Specific Gravity, Urine: 1.015 (ref 1.005–1.030)
WBC, UA: >50 WBC/hpf (ref 0–5)
pH: 6.5 (ref 5.0–8.0)

## 2021-09-02 LAB — LACTIC ACID, PLASMA: Lactic Acid, Venous: 1.1 mmol/L (ref 0.5–1.9)

## 2021-09-02 LAB — RESP PANEL BY RT-PCR (FLU A&B, COVID) ARPGX2
Influenza A by PCR: NEGATIVE
Influenza B by PCR: NEGATIVE
SARS Coronavirus 2 by RT PCR: NEGATIVE

## 2021-09-02 MED ORDER — LACTATED RINGERS IV BOLUS
1000.0000 mL | Freq: Once | INTRAVENOUS | Status: AC
  Administered 2021-09-02: 1000 mL via INTRAVENOUS

## 2021-09-02 MED ORDER — SODIUM CHLORIDE 0.9 % IV SOLN
1.0000 g | INTRAVENOUS | Status: DC
  Administered 2021-09-03: 1 g via INTRAVENOUS
  Filled 2021-09-02: qty 1
  Filled 2021-09-02: qty 10

## 2021-09-02 MED ORDER — DIROXIMEL FUMARATE (STARTER) 231 MG PO CPDR: 231.0000 mg | DELAYED_RELEASE_CAPSULE | ORAL | Status: DC

## 2021-09-02 MED ORDER — IOHEXOL 350 MG/ML SOLN
100.0000 mL | Freq: Once | INTRAVENOUS | Status: AC | PRN
  Administered 2021-09-03: 100 mL via INTRAVENOUS

## 2021-09-02 MED ORDER — SODIUM CHLORIDE 0.9 % IV SOLN: INTRAVENOUS | Status: DC

## 2021-09-02 MED ORDER — SODIUM CHLORIDE 0.9 % IV SOLN: 1.0000 g | Freq: Once | INTRAVENOUS | Status: DC

## 2021-09-02 MED ORDER — ACETAMINOPHEN 500 MG PO TABS
500.0000 mg | ORAL_TABLET | Freq: Four times a day (QID) | ORAL | Status: DC | PRN
  Administered 2021-09-03 – 2021-09-06 (×4): 500 mg via ORAL
  Filled 2021-09-02 (×5): qty 1

## 2021-09-02 MED ORDER — DULOXETINE HCL 30 MG PO CPEP
30.0000 mg | ORAL_CAPSULE | Freq: Two times a day (BID) | ORAL | Status: DC
  Administered 2021-09-03 – 2021-09-06 (×7): 30 mg via ORAL
  Filled 2021-09-02 (×8): qty 1

## 2021-09-02 MED ORDER — GABAPENTIN 100 MG PO CAPS
200.0000 mg | ORAL_CAPSULE | Freq: Every morning | ORAL | Status: DC
  Administered 2021-09-03 – 2021-09-06 (×4): 200 mg via ORAL
  Filled 2021-09-02 (×4): qty 2

## 2021-09-02 MED ORDER — SODIUM CHLORIDE 0.9 % IV SOLN
1.0000 g | Freq: Once | INTRAVENOUS | Status: AC
  Administered 2021-09-02: 1 g via INTRAVENOUS
  Filled 2021-09-02: qty 10

## 2021-09-02 MED ORDER — AMANTADINE HCL 100 MG PO CAPS
100.0000 mg | ORAL_CAPSULE | Freq: Two times a day (BID) | ORAL | Status: DC
  Administered 2021-09-03 – 2021-09-06 (×7): 100 mg via ORAL
  Filled 2021-09-02 (×10): qty 1

## 2021-09-02 MED ORDER — CEPHALEXIN 500 MG PO CAPS: 500.0000 mg | ORAL_CAPSULE | Freq: Four times a day (QID) | ORAL | Status: DC

## 2021-09-02 MED ORDER — ACETAMINOPHEN 325 MG PO TABS
650.0000 mg | ORAL_TABLET | Freq: Once | ORAL | Status: AC
  Administered 2021-09-02: 650 mg via ORAL
  Filled 2021-09-02: qty 2

## 2021-09-02 MED ORDER — BACLOFEN 10 MG PO TABS
20.0000 mg | ORAL_TABLET | Freq: Two times a day (BID) | ORAL | Status: DC
  Administered 2021-09-03 – 2021-09-06 (×7): 20 mg via ORAL
  Filled 2021-09-02 (×9): qty 2

## 2021-09-02 MED ORDER — OZEMPIC 1 MG/DOSE (2 MG/1.5 ML) SUBCUTANEOUS PEN INJECTOR
SUBCUTANEOUS | 2 refills | 28 days | Status: CP
Start: 2021-09-02 — End: ?

## 2021-09-02 NOTE — Unmapped (Signed)
Patient Requests Medication Refill    ??? Name of medication:  semaglutide (OZEMPIC) 1 mg/dose (2 mg/1.5 mL) PnIj      ??? Is there a preferred amount requested (e.g. 30 pills, 3 months worth - if no leave blank):  ??? Desired pharmacy (defaults to patient's preferred pharmacy list - confirm or change):          [facility], [place], [place] - [address]  [address]  [place] [address]  Phone: [phone] Fax: [phone]        ??? Best callback number if any questions (defaults to patient's preferred phone - confirm or change): [phone]  ??? PCP: [name], MD  ??? Last encounter in department: 06/17/2021 (If more than a year, offer an appointment.)

## 2021-09-02 NOTE — H&P (Signed)
History and Physical    [name] VZD:[id number] DOB: 1971/04/05 DOA: 09/02/2021  PCP: Hoyt Koch, MD    Patient coming from:  Home   Chief Complaint:  Upper extremity weakness   HPI:  [name] is a 50 y.o. female seen in ed with complaints of bilateral upper extremity weakness that started today after she woke up from a nap.  Patient reports to provider that previously whenever she has had infections she gets these upper extremity weakness sensations as her MS pattern.  Also had UTIs in the past patient has a urostomy. Patient states that she has been this midsternal intermittent, 5 of 10 pressure-like nonradiating no association of shortness of breath nausea vomiting.  And is not able to tell us anything additional is that she is acute onset today.  Patient's last admission was in February 2022 for similar presentation of sepsis and urinary tract infection and was started on Rocephin for treatment of UTI and treated treated cultures at that time were mixed with multiple flora.  Pt has past medical history of multiple sclerosis, recurrent UTI. ED Course:  Vitals:   09/02/21 1743 09/02/21 1902 09/02/21 2035 09/02/21 2130  BP:  90/67 115/74 122/75  Pulse:  (!) 106 (!) 110 (!) 114  Resp:  (!) 21 (!) 26 (!) 22  Temp:  99.6 F (37.6 C)    TempSrc:  Oral    SpO2:  100% 98% 95%  Weight: (!) 158 kg     Height: 5' 4"  (1.626 m)     In the emergency room patient is sleeping wakes up when called is alert awake oriented dehydrated mouth is dry mucous membranes dry, febrile temperature is low-grade 99.6 patient met sepsis criteria with respiratory rate tachycardia hypotension initially, lactic is normal at 1.1 and source of infection being urinary tract infection cultures from urostomy tube. Blood work today shows normal CMP with a glucose of 105, CBC is within normal limits with a normal white count of 9.6 hemoglobin of 13.9 and platelets of 358.  Review of Systems:   Review of Systems  Constitutional:  Positive for chills, fever and malaise/fatigue.  Cardiovascular:  Positive for chest pain.  Neurological:  Positive for weakness.    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 She has never used smokeless tobacco. She reports current alcohol use. She reports that she does not use drugs.  Allergies  Allergen Reactions   Latex Swelling and Other (See Comments)    Reaction:  Facial swelling     Family History  Problem Relation Age of Onset   Osteoarthritis Mother    Hypertension Mother    Hyperlipidemia Mother    Thyroid disease Mother    Liver cancer Father    Asthma Brother     Prior to Admission medications   Medication Sig Start Date End Date Taking? Authorizing Provider  acetaminophen (TYLENOL) 500 MG tablet Take 500 mg by mouth every 6 (six) hours as  needed.    [provider]  amantadine (SYMMETREL) 100 MG capsule Take 100 mg by mouth 2 (two) times daily.    [provider]  baclofen (LIORESAL) 20 MG tablet Take 20 mg by mouth 2 (two) times daily.     [provider]  cephALEXin (KEFLEX) 500 MG capsule Take 1 capsule (500 mg total) by mouth 4 (four) times daily for 7 days. 08/27/21 09/03/21  Duanne Guess, PA-C  cholecalciferol (VITAMIN D) 1000 units tablet Take 1,000 Units by mouth daily.    [provider]  docusate sodium (COLACE) 100 MG capsule Take 100 mg by mouth 2 (two) times daily as needed for  mild constipation.    [provider]  DULoxetine (CYMBALTA) 30 MG capsule Take 30 mg by mouth 2 (two) times daily.    [provider]  gabapentin (NEURONTIN) 800 MG tablet Take 800 mg by mouth 3 (three) times daily.    [provider]  lactulose (CHRONULAC) 10 GM/15ML solution Take 15 mLs (10 g total) by mouth daily as needed for mild constipation or severe constipation. 07/26/18   Max Sane, MD  polyethylene glycol (MIRALAX / GLYCOLAX) packet Take 17 g by mouth daily. 07/26/18   Max Sane, MD    Physical Exam: Vitals:   09/02/21 1743 09/02/21 1902 09/02/21 2035 09/02/21 2130  BP:  90/67 115/74 122/75  Pulse:  (!) 106 (!) 110 (!) 114  Resp:  (!) 21 (!) 26 (!) 22  Temp:  99.6 F (37.6 C)    TempSrc:  Oral    SpO2:  100% 98% 95%  Weight: (!) 158 kg     Height: 5' 4"  (1.626 m)      Physical Exam Vitals reviewed.  Constitutional:      Appearance: She is ill-appearing.  HENT:     Head: Normocephalic and atraumatic.     Right Ear: External ear normal.     Left Ear: External ear normal.     Nose: Nose normal.     Mouth/Throat:     Mouth: Mucous membranes are dry.  Eyes:     Extraocular Movements: Extraocular movements intact.  Cardiovascular:     Rate and Rhythm: Regular rhythm. Tachycardia present.     Pulses: Normal pulses.     Heart sounds: Normal heart sounds.  Pulmonary:     Effort: Pulmonary effort is normal.     Breath sounds: Normal breath sounds.  Abdominal:     General: Bowel sounds are normal. There is distension.     Palpations: Abdomen is soft. There is mass.     Tenderness: There is no abdominal tenderness. There is no guarding.  Musculoskeletal:       Arms:     Right lower leg: 1+ Edema present.     Left lower leg: 1+ Edema present.     Comments: Chest pain.  Neurological:     General: No focal deficit present.     Mental Status: She is alert and oriented to person, place, and time.  Psychiatric:        Mood and Affect:  Mood normal.        Behavior: Behavior normal.     Labs on Admission: I have personally reviewed following labs and imaging studies  No results for input(s): CKTOTAL, CKMB, TROPONINI in the last 72 hours. Lab Results  Component Value Date   WBC 9.6 09/02/2021   HGB 13.9 09/02/2021   HCT 43.1 09/02/2021   MCV 90.5  09/02/2021   PLT 358 09/02/2021    Recent Labs  Lab 09/02/21 2046  NA 137  K 3.6  CL 104  CO2 22  BUN 10  CREATININE 0.51  CALCIUM 8.7*  PROT 7.5  BILITOT 1.0  ALKPHOS 41  ALT 13  AST 19  GLUCOSE 105*   Lab Results  Component Value Date   CHOL 155 04/14/2017   HDL 29 (L) 04/14/2017   LDLCALC 90 04/14/2017   TRIG 178 (H) 04/14/2017   No results found for: DDIMER Invalid input(s): POCBNP   COVID-19 Labs No results for input(s): DDIMER, FERRITIN, LDH, CRP in the last 72 hours. Lab Results  Component Value Date   [place] NEGATIVE 09/02/2021   [place] NEGATIVE 11/07/2020    Radiological Exams on Admission: DG Chest Port 1 View  Result Date: 09/02/2021 CLINICAL DATA:  Chest pain and shortness of breath this morning EXAM: PORTABLE CHEST 1 VIEW COMPARISON:  07/24/2018 FINDINGS: Low lung volumes are present, causing crowding of the pulmonary vasculature. Suspected mild atelectasis at both lung bases. Cardiac shadow somewhat obscured by the diaphragms. Prominence of stool in the splenic flexure. The lungs appear otherwise clear. IMPRESSION: 1. Low lung volumes with mild atelectasis at both lung bases. 2. Prominence of stool at the splenic flexure the colon. Electronically Signed   By: Van Clines M.D.   On: 09/02/2021 17:56    EKG: Independently reviewed.  Sinus tachycardia 20 QTC of 376 abnormal EKG with multiple artifacts.   Assessment/Plan: Principal Problem:   Upper extremity weakness Active Problems:   Sepsis ([place])   Multiple sclerosis ([place])   Infection of urinary tract   Upper extremity weakness: Attribute to hypotension and  weakness secondary to sepsis presentation.  Sepsis/urinary tract infection: Patient presents with upper extremity weakness, palpitations, diaphoresis, hypotension secondary to urinary tract infection similar to previous episodes in the past.  Patient's last hospitalization was in February 2022.  In the emergency room today patient was given Rocephin for sepsis and urinary tract infection.  Concern patient may have ESBL multidrug-resistant urinary tract infection due to her condition and will discuss with pharmacy for antibiotics to avoid in her MS case.   MS:  We will continue patient's  DVT prophylaxis:  Heparin  Code Status:  Full code  Family Communication:  [name], [name] (Mother)  [phone] (Mobile)   Disposition Plan:  Home  Consults called:  None  Admission status: Inpatient   Para Skeans MD [facility] [phone] How to contact the [facility] Attending or Consulting provider [place] or covering provider during after hours [place], for this patient.    Check the care team in [facility] and look for a) attending/consulting [facility] provider listed and b) the [facility] team listed Log into [email or web address] and use [place]'s universal password to access. If you do not have the password, please contact the hospital operator. Locate the [facility] provider you are looking for under [facility] and page to a number that you can be directly reached. If you still have difficulty reaching the provider, please page the [facility] (Director on Call) for the Hospitalists listed on amion for assistance. [email or web address] Password [email or web address] 09/02/2021, 11:45 PM

## 2021-09-02 NOTE — ED Provider Notes (Signed)
[facility] Emergency Department Provider Note   ____________________________________________   I have reviewed the triage vital signs and the nursing notes.   HISTORY  Chief Complaint Chest pain   History limited by: Not Limited   HPI [name] is a 50 y.o. female who presents to the emergency department today with primary complaint of chest pain. States it started today. Also complaining of upper extremity weakness. Patient has MS and started new medication last week. Thinks that the weakness might be related to the new medication. However the patient has had similar UE weakness with infections. Has not felt like she has had any fevers, but did feel like her breath was hot earlier today. Also says she has not been eating or drinking as much as she should. Her aide has not noticed any change in her urine recently.  Records reviewed. Per medical record review patient has a history of MS, has urostomy. Hx of previous UTI that caused similar UE weakness.   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    Multiple sclerosis (HCC)    Neuropathic pain 05/21/2015   History of construction of external stoma of urinary system 04/29/2015   History of spinal surgery 04/29/2015   Edema leg 09/05/2014   Adiposity 09/05/2014   Abdominal wall hernia 09/05/2014   Multiple  sclerosis, 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   Neurogenic bowel 08/12/2012   Infection of urinary tract 04/07/2006   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 Authorizing Provider  acetaminophen (TYLENOL) 500 MG tablet Take 500 mg by mouth every 6 (six) hours as needed.    [provider]  amantadine (SYMMETREL) 100 MG capsule Take 100 mg by mouth 2 (two) times daily.    [provider]  baclofen (LIORESAL) 20 MG tablet Take 20 mg by mouth 2 (two) times daily.     [provider]  cephALEXin (KEFLEX) 500 MG capsule Take 1 capsule (500 mg total) by mouth 4 (four) times daily for 7 days. 08/27/21 09/03/21  Evon Slack, PA-C  cholecalciferol (VITAMIN D) 1000 units tablet Take 1,000 Units by mouth daily.    [provider]  docusate sodium (COLACE) 100 MG capsule Take 100 mg by mouth 2 (two) times daily as needed for mild constipation.    [provider]  DULoxetine (CYMBALTA) 30 MG capsule Take 30 mg by mouth 2 (two) times daily.    [provider]  gabapentin (NEURONTIN) 800 MG tablet Take 800 mg by mouth 3 (three) times daily.    [provider]  lactulose (CHRONULAC) 10 GM/15ML solution Take 15 mLs (10 g total) by mouth daily as needed for mild  constipation or severe constipation. 07/26/18   Delfino Lovett, MD  polyethylene glycol (MIRALAX / GLYCOLAX) packet Take 17 g by mouth daily. 07/26/18   Delfino Lovett, MD    Allergies Latex  Family History  Problem Relation Age of Onset   Osteoarthritis Mother    Hypertension Mother    Hyperlipidemia Mother    Thyroid disease Mother    Liver cancer Father    Asthma Brother     Social History Social History   Tobacco Use   Smoking status: Never   Smokeless  tobacco: Never  Substance Use Topics   Alcohol use: Yes    Alcohol/week: 0.0 standard drinks    Comment: socially   Drug use: No    Review of Systems Constitutional: No fever/chills Eyes: No visual changes. ENT: No sore throat. Cardiovascular: Positive for chest pain. Respiratory: Denies shortness of breath. Gastrointestinal: No abdominal pain.  No nausea, no vomiting.  No diarrhea.   Genitourinary: Negative for dysuria. Musculoskeletal: Negative for back pain. Skin: Negative for rash. Neurological: Positive for UE weakness.  ____________________________________________   PHYSICAL EXAM:  VITAL SIGNS: ED Triage Vitals  Enc Vitals Group     BP 09/02/21 1722 (!) 100/49     Pulse Rate 09/02/21 1722 (!) 121     Resp 09/02/21 1722 17     Temp 09/02/21 1722 (!) 101.2 F (38.4 C)     Temp Source 09/02/21 1722 Oral     SpO2 09/02/21 1722 98 %     Weight 09/02/21 1743 (!) 348 lb 5.2 oz (158 kg)     Height 09/02/21 1743 5\' 4"  (1.626 m)     Head Circumference --      Peak Flow --      Pain Score 09/02/21 1725 5   Constitutional: Alert and oriented.  Eyes: Conjunctivae are normal.  ENT      Head: Normocephalic and atraumatic.      Nose: No congestion/rhinnorhea.      Mouth/Throat: Mucous membranes are moist.      Neck: No stridor. Hematological/Lymphatic/Immunilogical: No cervical lymphadenopathy. Cardiovascular: Tachycardic, regular rhythm.  No murmurs, rubs, or gallops.  Respiratory: Normal respiratory effort without tachypnea nor retractions. Breath sounds are clear and equal bilaterally. No wheezes/rales/rhonchi. Gastrointestinal: Soft and non tender. No rebound. No guarding.  Genitourinary: urostomy Musculoskeletal: Normal range of motion in all extremities. No lower extremity edema. Neurologic:  Sequelae of MS. Grip 3/5 in bilateral UE. Skin:  Skin is warm, dry and intact. No rash noted. Psychiatric: Mood and affect are normal. Speech and behavior are normal. Patient  exhibits appropriate insight and judgment.  ____________________________________________    LABS (pertinent positives/negatives)  Lactic acid 1.1 CBC wbc 9.6, hgb 13.9, plt 358 UA hazy, positive nitrite, large leukocytes, >50 WBC  ____________________________________________   EKG  I, 14/08/22, attending physician, personally viewed and interpreted this EKG  EKG Time: 1711 Rate: 120 Rhythm: sinus tachycardia Axis: normal Intervals: qtc 376 QRS: narrow ST changes: no st elevation Impression: abnormal ekg  ____________________________________________    RADIOLOGY  CXR Low lung volumes, atelectasis at bases  ____________________________________________   PROCEDURES  Procedures  ____________________________________________   INITIAL IMPRESSION / ASSESSMENT AND PLAN / ED COURSE  Pertinent labs & imaging results that were available during my care of the patient were reviewed by me and considered in my medical decision making (see chart for details).   Patient presented to the emergency department today with complaints of some chest pain.  Also complaining of upper  extremity weakness.  Per chart review she has had upper extremity weakness with infections in the past.  Does have a urostomy secondary to neurogenic bladder.  Urine today is concerning for infection.  Did chest chest x-ray which did not show any pneumonia.  Will plan on starting antibiotics.  Will plan on giving IV fluids.  Will plan on admission.  Discussed plan of findings with patient.  ____________________________________________   FINAL CLINICAL IMPRESSION(S) / ED DIAGNOSES  Final diagnoses:  Acute lower UTI (urinary tract infection)     Note: This dictation was prepared with Dragon dictation. Any transcriptional errors that result from this process are unintentional     Phineas Semen, MD 09/02/21 2243

## 2021-09-02 NOTE — ED Notes (Signed)
Contacted lab regarding delayed CMP results.

## 2021-09-02 NOTE — ED Triage Notes (Signed)
Pt to ED via [facility] for CP and fever. Pt states she started having CP with SOB this AM, and "felt like my heart was beating out of my chest". Pt mother took her temp at home and states it was 101. Pt also states she is on new MS medication-Vumerity x1 wk.   Pt has hx of MS, Chronic UTI's. Pt has urostomy bag.  Pt is bedbound and unable to move Lower Extremities and has partial movement in Upper extremities.   A&Ox4, NAD

## 2021-09-03 ENCOUNTER — Inpatient Hospital Stay (HOSPITAL_COMMUNITY)
Admit: 2021-09-03 | Discharge: 2021-09-03 | Disposition: A | Discharge status: Home / Self Care | Payer: Medicare Other | Attending: Internal Medicine | Admitting: Internal Medicine

## 2021-09-03 ENCOUNTER — Encounter: Payer: Self-pay | Admitting: Internal Medicine

## 2021-09-03 DIAGNOSIS — E860 Dehydration: Secondary | ICD-10-CM | POA: Diagnosis present

## 2021-09-03 DIAGNOSIS — Z87440 Personal history of urinary (tract) infections: Secondary | ICD-10-CM | POA: Diagnosis not present

## 2021-09-03 DIAGNOSIS — R29898 Other symptoms and signs involving the musculoskeletal system: Secondary | ICD-10-CM | POA: Diagnosis present

## 2021-09-03 DIAGNOSIS — N319 Neuromuscular dysfunction of bladder, unspecified: Secondary | ICD-10-CM | POA: Diagnosis present

## 2021-09-03 DIAGNOSIS — N390 Urinary tract infection, site not specified: Secondary | ICD-10-CM | POA: Diagnosis present

## 2021-09-03 DIAGNOSIS — Z91040 Latex allergy status: Secondary | ICD-10-CM | POA: Diagnosis not present

## 2021-09-03 DIAGNOSIS — G35 Multiple sclerosis: Secondary | ICD-10-CM | POA: Diagnosis present

## 2021-09-03 DIAGNOSIS — N99521 Infection of other external stoma of urinary tract: Secondary | ICD-10-CM | POA: Diagnosis present

## 2021-09-03 DIAGNOSIS — G8220 Paraplegia, unspecified: Secondary | ICD-10-CM | POA: Diagnosis present

## 2021-09-03 DIAGNOSIS — A419 Sepsis, unspecified organism: Secondary | ICD-10-CM | POA: Diagnosis not present

## 2021-09-03 DIAGNOSIS — Z90710 Acquired absence of both cervix and uterus: Secondary | ICD-10-CM | POA: Diagnosis not present

## 2021-09-03 DIAGNOSIS — R079 Chest pain, unspecified: Secondary | ICD-10-CM | POA: Diagnosis not present

## 2021-09-03 DIAGNOSIS — Z6841 Body Mass Index (BMI) 40.0 and over, adult: Secondary | ICD-10-CM | POA: Diagnosis not present

## 2021-09-03 DIAGNOSIS — E876 Hypokalemia: Secondary | ICD-10-CM | POA: Diagnosis present

## 2021-09-03 DIAGNOSIS — Z20822 Contact with and (suspected) exposure to covid-19: Secondary | ICD-10-CM | POA: Diagnosis present

## 2021-09-03 DIAGNOSIS — Z79899 Other long term (current) drug therapy: Secondary | ICD-10-CM | POA: Diagnosis not present

## 2021-09-03 DIAGNOSIS — A4152 Sepsis due to Pseudomonas: Secondary | ICD-10-CM | POA: Diagnosis present

## 2021-09-03 DIAGNOSIS — R6520 Severe sepsis without septic shock: Secondary | ICD-10-CM | POA: Diagnosis not present

## 2021-09-03 LAB — ECHOCARDIOGRAM COMPLETE
AR max vel: 3.31 cm2
AV Area VTI: 3.34 cm2
AV Area mean vel: 3.56 cm2
AV Mean grad: 3 mmHg
AV Peak grad: 5.4 mmHg
Ao pk vel: 1.16 m/s
Area-P 1/2: 8.43 cm2
Height: 64 in
MV VTI: 2.38 cm2
S' Lateral: 1.8 cm
Weight: 5573.23 oz

## 2021-09-03 LAB — CORTISOL-AM, BLOOD: Cortisol - AM: 9.5 ug/dL (ref 6.7–22.6)

## 2021-09-03 LAB — PROCALCITONIN: Procalcitonin: <0.1 ng/mL

## 2021-09-03 LAB — TROPONIN I (HIGH SENSITIVITY)
Troponin I (High Sensitivity): 3 ng/L (ref <18)
Troponin I (High Sensitivity): <2 ng/L (ref <18)

## 2021-09-03 MED ORDER — PERFLUTREN LIPID MICROSPHERE
1.0000 mL | INTRAVENOUS | Status: AC | PRN
Start: 2021-09-03 — End: 2021-09-03
  Administered 2021-09-03: 2 mL via INTRAVENOUS
  Filled 2021-09-03: qty 10

## 2021-09-03 MED ORDER — ONDANSETRON HCL 4 MG/2ML IJ SOLN
4.0000 mg | Freq: Four times a day (QID) | INTRAMUSCULAR | Status: DC | PRN
  Administered 2021-09-03 – 2021-09-05 (×3): 4 mg via INTRAVENOUS
  Filled 2021-09-03 (×3): qty 2

## 2021-09-03 MED ORDER — LOPERAMIDE HCL 2 MG PO CAPS: 2.0000 mg | ORAL_CAPSULE | Freq: Three times a day (TID) | ORAL | Status: DC | PRN

## 2021-09-03 MED ORDER — SORBITOL 70 % SOLN
960.0000 mL | TOPICAL_OIL | Freq: Once | ORAL | Status: DC
  Filled 2021-09-03: qty 473

## 2021-09-03 MED ORDER — POLYETHYLENE GLYCOL 3350 17 G PO PACK: 17.0000 g | PACK | Freq: Every day | ORAL | Status: DC

## 2021-09-03 MED ORDER — HEPARIN SODIUM (PORCINE) 5000 UNIT/ML IJ SOLN
5000.0000 [IU] | Freq: Three times a day (TID) | INTRAMUSCULAR | Status: DC
  Administered 2021-09-03 – 2021-09-06 (×10): 5000 [IU] via SUBCUTANEOUS
  Filled 2021-09-03 (×10): qty 1

## 2021-09-03 MED ORDER — SODIUM CHLORIDE 0.9 % IV SOLN: INTRAVENOUS | Status: AC

## 2021-09-03 MED ORDER — GABAPENTIN 400 MG PO CAPS
800.0000 mg | ORAL_CAPSULE | Freq: Every day | ORAL | Status: DC
  Administered 2021-09-03 – 2021-09-05 (×3): 800 mg via ORAL
  Filled 2021-09-03 (×3): qty 2

## 2021-09-03 NOTE — Unmapped (Signed)
I called the patient in response to a message left on our nurse line indicating that she was having difficulty moving her B/L UE, chest beating fast and high pulse. I was unable to speak with the patient and left a message encouraging the patient to go to the ER per Ms. Zelasky's recommendation.

## 2021-09-03 NOTE — Unmapped (Signed)
I received call from patient stating that she cannot move her arms and my chest is beating fast.    Patient stated that she was going to the ED.  I clled back, but call went to VM.  I left a message instructing patient to go to the ED, per [facility].  Yolande Jolly, PA was notified via secure chat, along with Jolayne Haines, RN and Minna Antis, RN.Marland Kitchen

## 2021-09-03 NOTE — ED Notes (Signed)
Patient transported to CT 

## 2021-09-03 NOTE — Progress Notes (Signed)
Small episode of diarrhea incontinence. Peri care performed and brief changed. Patient placed on hospital bed. Denies further needs at this time.

## 2021-09-03 NOTE — Progress Notes (Signed)
*  PRELIMINARY RESULTS* Echocardiogram 2D Echocardiogram has been performed.  Joanette Gula  09/03/2021, 1:44 PM

## 2021-09-03 NOTE — Progress Notes (Addendum)
Progress Note    [name]  [id number] DOB: [birth year]  DOA: 09/02/2021 PCP: Hoyt Koch, MD      Brief Narrative:    Medical records reviewed and are as summarized below:  [name] is a 50 y.o. female with medical history significant for primary progressive multiple sclerosis, neuropathic pain, paraplegia, who presented to the hospital because of weakness in her upper extremities.  She also complained of diarrhea after starting her new multiple sclerosis medication (diroximel fumarate).  She said she normally develops weakness in the upper extremities when she has an infection in the urine.  She was febrile, tachycardic, hypotensive and tachypneic in the emergency room.  She was admitted to the hospital for sepsis secondary to acute UTI.    Assessment/Plan:   Principal Problem:   Sepsis ([place]) Active Problems:   Infection of urinary tract   Multiple sclerosis ([facility])   Upper extremity weakness   Weakness of upper extremity   Body mass index is 59.79 kg/m.  (Morbid obesity)  Sepsis secondary to acute UTI: Continue empiric IV Rocephin.  Discontinue IV fluids later today.  Follow-up urine and blood cultures.  Diarrhea: Imodium as needed for diarrhea.  CT abdomen and pelvis showed mild colonic constipation and changes suspicious for fecal impaction.  However, patient reports diarrhea and findings on CT scan do not match the clinical picture.  Multiple sclerosis, neuropathic pain: Continue baclofen, Cymbalta and gabapentin.  She prefers to hold off on diroximel fumarate.   Diet Order             Diet Heart Room service appropriate? Yes; Fluid consistency: Thin  Diet effective now                      Consultants: None  Procedures: None    Medications:    amantadine  100 mg Oral BID   baclofen  20 mg Oral BID   DULoxetine  30 mg Oral BID   gabapentin  200 mg Oral q morning   heparin  5,000 Units Subcutaneous Q8H    Continuous Infusions:  sodium chloride 100 mL/hr at 09/03/21 0950   cefTRIAXone (ROCEPHIN)  IV       Anti-infectives (From admission, onward)    Start     Dose/Rate Route Frequency Ordered Stop   09/03/21 1900  cefTRIAXone (ROCEPHIN) 1 g in sodium chloride 0.9 % 100 mL IVPB        1 g 200 mL/hr over 30 Minutes Intravenous Every 24 hours 09/02/21 2347     09/03/21 0000  cefTRIAXone (ROCEPHIN) 1 g in sodium chloride 0.9 % 100 mL IVPB  Status:  Discontinued        1 g 200 mL/hr over 30 Minutes Intravenous  Once 09/02/21 2347 09/02/21 2347   09/02/21 2345  cephALEXin (KEFLEX) capsule 500 mg  Status:  Discontinued        500 mg Oral 4 times daily 09/02/21 2344 09/02/21 2347   09/02/21 1900  cefTRIAXone (ROCEPHIN) 1 g in sodium chloride 0.9 % 100 mL IVPB        1 g 200 mL/hr over 30 Minutes Intravenous  Once 09/02/21 1847 09/02/21 1942              Family Communication/Anticipated D/C date and plan/Code Status   DVT prophylaxis: heparin injection 5,000 Units Start: 09/03/21 0600 SCDs Start: 09/03/21 0136     Code Status: Full Code  Family Communication: None Disposition  Plan: Plan to discharge home in 2 to 3 days   Status is: Inpatient  Remains inpatient appropriate because: On IV antibiotics           Subjective:   C/o weakness in bilateral upper extremities but weakness is improving.  She has mild abdominal pain.  Objective:    Vitals:   09/03/21 0352 09/03/21 0800 09/03/21 1200 09/03/21 1412  BP: 119/79 123/75 115/74 117/77  Pulse: (!) 109 (!) 113 (!) 101 (!) 109  Resp: 13 (!) 24 (!) 21 19  Temp: 99.4 F (37.4 C)   98 F (36.7 C)  TempSrc: Oral   Oral  SpO2: 97% 96% 99% 99%  Weight:      Height:       No data found.   Intake/Output Summary (Last 24 hours) at 09/03/2021 1524 Last data filed at 09/03/2021 1500 Gross per 24 hour  Intake 1438.33 ml  Output 800 ml  Net 638.33 ml   Filed Weights   09/02/21 1743  Weight: (!) 158 kg     Exam:  GEN: NAD SKIN: Warm and dry EYES: No pallor or icterus ENT: MMM CV: RRR PULM: CTA B ABD: soft, ND, NT, +BS, + urostomy CNS: AAO x 3, paraplegic.  She is able to move both upper extremities (power is 4/5) EXT: No edema or tenderness        Data Reviewed:   I have personally reviewed following labs and imaging studies:  Labs: Labs show the following:   Basic Metabolic Panel: Recent Labs  Lab 09/02/21 2046  NA 137  K 3.6  CL 104  CO2 22  GLUCOSE 105*  BUN 10  CREATININE 0.51  CALCIUM 8.7*   GFR Estimated Creatinine Clearance: 126.1 mL/min (by C-G formula based on SCr of 0.51 mg/dL). Liver Function Tests: Recent Labs  Lab 09/02/21 2046  AST 19  ALT 13  ALKPHOS 41  BILITOT 1.0  PROT 7.5  ALBUMIN 3.4*   No results for input(s): LIPASE, AMYLASE in the last 168 hours. No results for input(s): AMMONIA in the last 168 hours. Coagulation profile No results for input(s): INR, PROTIME in the last 168 hours.  CBC: Recent Labs  Lab 09/02/21 1745  WBC 9.6  NEUTROABS 7.2  HGB 13.9  HCT 43.1  MCV 90.5  PLT 358   Cardiac Enzymes: No results for input(s): CKTOTAL, CKMB, CKMBINDEX, TROPONINI in the last 168 hours. BNP (last 3 results) No results for input(s): PROBNP in the last 8760 hours. CBG: No results for input(s): GLUCAP in the last 168 hours. D-Dimer: No results for input(s): DDIMER in the last 72 hours. Hgb A1c: No results for input(s): HGBA1C in the last 72 hours. Lipid Profile: No results for input(s): CHOL, HDL, LDLCALC, TRIG, CHOLHDL, LDLDIRECT in the last 72 hours. Thyroid function studies: No results for input(s): TSH, T4TOTAL, T3FREE, THYROIDAB in the last 72 hours.  Invalid input(s): FREET3 Anemia work up: No results for input(s): VITAMINB12, FOLATE, FERRITIN, TIBC, IRON, RETICCTPCT in the last 72 hours. Sepsis Labs: Recent Labs  Lab 09/02/21 1745 09/03/21 0625  PROCALCITON  --  <0.10  WBC 9.6  --   LATICACIDVEN 1.1  --      Microbiology Recent Results (from the past 240 hour(s))  Blood Culture (routine x 2)     Status: None (Preliminary result)   Collection Time: 09/02/21  5:45 PM   Specimen: BLOOD  Result Value Ref Range Status   Specimen Description BLOOD BLOOD LEFT FOREARM  Final  Special Requests   Final    BOTTLES DRAWN AEROBIC AND ANAEROBIC Blood Culture adequate volume   Culture   Final    NO GROWTH < 12 HOURS Performed at [facility], [address] [place], [place] [address]    Report Status PENDING  Incomplete  Blood Culture (routine x 2)     Status: None (Preliminary result)   Collection Time: 09/02/21  5:45 PM   Specimen: BLOOD  Result Value Ref Range Status   Specimen Description BLOOD LEFT ANTECUBITAL  Final   Special Requests   Final    BOTTLES DRAWN AEROBIC AND ANAEROBIC Blood Culture results may not be optimal due to an inadequate volume of blood received in culture bottles   Culture   Final    NO GROWTH < 12 HOURS Performed at [facility], [address], [place], [place] [address]    Report Status PENDING  Incomplete  Resp Panel by RT-PCR (Flu A&B, Covid)     Status: None   Collection Time: 09/02/21  5:45 PM   Specimen: Nasopharyngeal(NP) swabs in vial transport medium  Result Value Ref Range Status   SARS Coronavirus 2 by RT PCR NEGATIVE NEGATIVE Final    Comment: (NOTE) SARS-CoV-2 target nucleic acids are NOT DETECTED.  The SARS-CoV-2 RNA is generally detectable in upper respiratory specimens during the acute phase of infection. The lowest concentration of SARS-CoV-2 viral copies this assay can detect is 138 copies/mL. A negative result does not preclude SARS-Cov-2 infection and should not be used as the sole basis for treatment or other patient management decisions. A negative result may occur with  improper specimen collection/handling, submission of specimen other than nasopharyngeal swab, presence of viral mutation(s) within the areas  targeted by this assay, and inadequate number of viral copies(<138 copies/mL). A negative result must be combined with clinical observations, patient history, and epidemiological information. The expected result is Negative.  Fact Sheet for Patients:  BloggerCourse.com  Fact Sheet for Healthcare Providers:  SeriousBroker.it  This test is no t yet approved or cleared by the [place] FDA and  has been authorized for detection and/or diagnosis of SARS-CoV-2 by FDA under an Emergency Use Authorization (EUA). This EUA will remain  in effect (meaning this test can be used) for the duration of the COVID-19 declaration under Section 564(b)(1) of the Act, 21 U.S.C.section 360bbb-3(b)(1), unless the authorization is terminated  or revoked sooner.       Influenza A by PCR NEGATIVE NEGATIVE Final   Influenza B by PCR NEGATIVE NEGATIVE Final    Comment: (NOTE) The Xpert Xpress SARS-CoV-2/FLU/RSV plus assay is intended as an aid in the diagnosis of influenza from Nasopharyngeal swab specimens and should not be used as a sole basis for treatment. Nasal washings and aspirates are unacceptable for Xpert Xpress SARS-CoV-2/FLU/RSV testing.  Fact Sheet for Patients: BloggerCourse.com  Fact Sheet for Healthcare Providers: SeriousBroker.it  This test is not yet approved or cleared by the [place] FDA and has been authorized for detection and/or diagnosis of SARS-CoV-2 by FDA under an Emergency Use Authorization (EUA). This EUA will remain in effect (meaning this test can be used) for the duration of the COVID-19 declaration under Section 564(b)(1) of the Act, 21 U.S.C. section 360bbb-3(b)(1), unless the authorization is terminated or revoked.  Performed at [facility], [address] [place], [place] [address]     Procedures and diagnostic studies:  CT Angio Chest  Pulmonary Embolism (PE) W or WO Contrast  Result Date: 09/03/2021 CLINICAL DATA:  Chest pain and shortness of breath, history of abdominal distension and diarrhea EXAM: CT ANGIOGRAPHY CHEST CT ABDOMEN AND PELVIS WITH CONTRAST TECHNIQUE: Multidetector CT imaging of the chest was performed using the standard protocol during bolus administration of intravenous contrast. Multiplanar CT image reconstructions and MIPs were obtained to evaluate the vascular anatomy. Multidetector CT imaging of the abdomen and pelvis was performed using the standard protocol during bolus administration of intravenous contrast. CONTRAST:  140mL OMNIPAQUE IOHEXOL 350 MG/ML SOLN COMPARISON:  Chest x-ray from the previous day. FINDINGS: CTA CHEST FINDINGS Cardiovascular: No evidence of aneurysmal dilatation or dissection is noted within the aorta. No cardiac enlargement is seen. No coronary calcifications are noted. The pulmonary artery as visualized is within normal limits without focal filling defect suggest pulmonary embolism. Mediastinum/Nodes: Thoracic inlet is within normal limits. No sizable hilar or mediastinal adenopathy is noted. The esophagus as visualized is within normal limits. Lungs/Pleura: Lungs are well aerated with bibasilar atelectatic changes. No focal infiltrate, effusion or pneumothorax is seen. Musculoskeletal: Degenerative changes of the thoracic spine are noted. No acute rib abnormality is noted. No compression deformity is seen. Review of the MIP images confirms the above findings. CT ABDOMEN and PELVIS FINDINGS Hepatobiliary: Gallbladder is well distended with dependent gallstones. No wall thickening is noted. The liver is within normal limits. Pancreas: Unremarkable. No pancreatic ductal dilatation or surrounding inflammatory changes. Spleen: Normal in size without focal abnormality. Adrenals/Urinary Tract: Adrenal glands are within normal limits. Kidneys demonstrate a normal enhancement pattern. Scarring is noted  bilaterally. No renal calculi or obstructive changes are seen. Delayed images demonstrate normal excretion of contrast material bilaterally. 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 The bladder is not visualized consistent with a prior surgical history. A right lower quadrant urostomy is noted. Stomach/Bowel: Fecal material is noted throughout the colon consistent with a degree of constipation. There are findings suspicious for fecal impaction in the rectum. No inflammatory changes to suggest stercoral colitis are seen. The appendix is not well visualized although no inflammatory changes to suggest appendicitis are noted. Small bowel and stomach are unremarkable. Vascular/Lymphatic: Aortic atherosclerosis. No enlarged abdominal or pelvic lymph nodes. Reproductive: Status post hysterectomy. No adnexal masses. Other: No abdominal wall hernia or abnormality. No abdominopelvic ascites. Musculoskeletal: Degenerative and postoperative changes of the lumbar spine are noted. No acute bony abnormality is seen. Review of the MIP images confirms the above findings. IMPRESSION: CTA of the chest: No evidence of pulmonary emboli. Bibasilar atelectatic changes. CT of the abdomen and pelvis: Cholelithiasis without complicating factors. Changes suspicious for fecal impaction within the rectum. Mild colonic constipation are is seen. No other focal abnormality is noted. Electronically Signed   By: Inez Catalina M.D.   On: 09/03/2021 00:32   CT ABDOMEN PELVIS W CONTRAST  Result Date: 09/03/2021 CLINICAL DATA:  Chest pain and shortness of breath, history of abdominal distension and diarrhea EXAM: CT ANGIOGRAPHY CHEST CT ABDOMEN AND PELVIS WITH CONTRAST TECHNIQUE: Multidetector CT imaging of the chest was performed using the standard protocol during bolus administration of intravenous contrast. Multiplanar CT image reconstructions and MIPs were obtained to evaluate the vascular anatomy. Multidetector CT imaging of the  abdomen and pelvis was performed using the standard protocol during bolus administration of intravenous contrast. CONTRAST:  150mL OMNIPAQUE IOHEXOL 350 MG/ML SOLN COMPARISON:  Chest x-ray from the previous day. FINDINGS: CTA CHEST FINDINGS Cardiovascular: No evidence of aneurysmal dilatation or dissection is noted within the aorta. No cardiac  enlargement is seen. No coronary calcifications are noted. The pulmonary artery as visualized is within normal limits without focal filling defect suggest pulmonary embolism. Mediastinum/Nodes: Thoracic inlet is within normal limits. No sizable hilar or mediastinal adenopathy is noted. The esophagus as visualized is within normal limits. Lungs/Pleura: Lungs are well aerated with bibasilar atelectatic changes. No focal infiltrate, effusion or pneumothorax is seen. Musculoskeletal: Degenerative changes of the thoracic spine are noted. No acute rib abnormality is noted. No compression deformity is seen. Review of the MIP images confirms the above findings. CT ABDOMEN and PELVIS FINDINGS Hepatobiliary: Gallbladder is well distended with dependent gallstones. No wall thickening is noted. The liver is within normal limits. Pancreas: Unremarkable. No pancreatic ductal dilatation or surrounding inflammatory changes. Spleen: Normal in size without focal abnormality. Adrenals/Urinary Tract: Adrenal glands are within normal limits. Kidneys demonstrate a normal enhancement pattern. Scarring is noted bilaterally. No renal calculi or obstructive changes are seen. Delayed images demonstrate normal excretion of contrast material bilaterally. 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 The bladder is not visualized consistent with a prior surgical history. A right lower quadrant urostomy is noted. Stomach/Bowel: Fecal material is noted throughout the colon consistent with a degree of constipation. There are findings suspicious for fecal impaction in the rectum. No inflammatory changes  to suggest stercoral colitis are seen. The appendix is not well visualized although no inflammatory changes to suggest appendicitis are noted. Small bowel and stomach are unremarkable. Vascular/Lymphatic: Aortic atherosclerosis. No enlarged abdominal or pelvic lymph nodes. Reproductive: Status post hysterectomy. No adnexal masses. Other: No abdominal wall hernia or abnormality. No abdominopelvic ascites. Musculoskeletal: Degenerative and postoperative changes of the lumbar spine are noted. No acute bony abnormality is seen. Review of the MIP images confirms the above findings. IMPRESSION: CTA of the chest: No evidence of pulmonary emboli. Bibasilar atelectatic changes. CT of the abdomen and pelvis: Cholelithiasis without complicating factors. Changes suspicious for fecal impaction within the rectum. Mild colonic constipation are is seen. No other focal abnormality is noted. Electronically Signed   By: Alcide Clever M.D.   On: 09/03/2021 00:32   DG Chest Port 1 View  Result Date: 09/02/2021 CLINICAL DATA:  Chest pain and shortness of breath this morning EXAM: PORTABLE CHEST 1 VIEW COMPARISON:  07/24/2018 FINDINGS: Low lung volumes are present, causing crowding of the pulmonary vasculature. Suspected mild atelectasis at both lung bases. Cardiac shadow somewhat obscured by the diaphragms. Prominence of stool in the splenic flexure. The lungs appear otherwise clear. IMPRESSION: 1. Low lung volumes with mild atelectasis at both lung bases. 2. Prominence of stool at the splenic flexure the colon. Electronically Signed   By: Gaylyn Rong M.D.   On: 09/02/2021 17:56               LOS: 0 days      [facility]   Pager on [email or web address]. If 7PM-7AM, please contact night-coverage at [email or web address]     09/03/2021, 3:24 PM

## 2021-09-03 NOTE — Progress Notes (Signed)
Hospital bed ordered for patient.

## 2021-09-03 NOTE — Progress Notes (Signed)
Patient received to ED 35. Patient alert and oriented. NAD, Vitals stable. On bedside monitor. Repositioned for comfort. All possessions  and call bell on bedside table and within reach. No other needs at this time.

## 2021-09-04 LAB — BASIC METABOLIC PANEL
Anion gap: 7 (ref 5–15)
BUN: 10 mg/dL (ref 6–20)
CO2: 24 mmol/L (ref 22–32)
Calcium: 8.1 mg/dL — ABNORMAL LOW (ref 8.9–10.3)
Chloride: 109 mmol/L (ref 98–111)
Creatinine, Ser: 0.41 mg/dL — ABNORMAL LOW (ref 0.44–1.00)
GFR, Estimated: >60 mL/min (ref >60)
Glucose, Bld: 105 mg/dL — ABNORMAL HIGH (ref 70–99)
Potassium: 2.9 mmol/L — ABNORMAL LOW (ref 3.5–5.1)
Sodium: 140 mmol/L (ref 135–145)

## 2021-09-04 LAB — MAGNESIUM: Magnesium: 1.8 mg/dL (ref 1.7–2.4)

## 2021-09-04 LAB — PHOSPHORUS: Phosphorus: 3 mg/dL (ref 2.5–4.6)

## 2021-09-04 MED ORDER — SODIUM CHLORIDE 0.9 % IV SOLN
2.0000 g | Freq: Three times a day (TID) | INTRAVENOUS | Status: DC
  Administered 2021-09-04 – 2021-09-06 (×6): 2 g via INTRAVENOUS
  Filled 2021-09-04 (×11): qty 2

## 2021-09-04 MED ORDER — SODIUM CHLORIDE 0.9 % IV BOLUS
500.0000 mL | Freq: Once | INTRAVENOUS | Status: AC
  Administered 2021-09-04: 500 mL via INTRAVENOUS

## 2021-09-04 MED ORDER — MAGNESIUM SULFATE 2 GM/50ML IV SOLN
2.0000 g | Freq: Once | INTRAVENOUS | Status: AC
  Administered 2021-09-04: 2 g via INTRAVENOUS
  Filled 2021-09-04: qty 50

## 2021-09-04 MED ORDER — POTASSIUM CHLORIDE CRYS ER 20 MEQ PO TBCR
40.0000 meq | EXTENDED_RELEASE_TABLET | ORAL | Status: AC
  Administered 2021-09-04 (×2): 40 meq via ORAL
  Filled 2021-09-04 (×2): qty 2

## 2021-09-04 NOTE — Progress Notes (Signed)
Progress Note    [name]  [id number] DOB: [birth year]  DOA: 09/02/2021 PCP: Hoyt Koch, MD      Brief Narrative:    Medical records reviewed and are as summarized below:  [name] is a 50 y.o. female with medical history significant for primary progressive multiple sclerosis, neuropathic pain, paraplegia, who presented to the hospital because of weakness in her upper extremities.  She also complained of diarrhea after starting her new multiple sclerosis medication (diroximel fumarate).  She said she normally develops weakness in the upper extremities when she has an infection in the urine.  She was febrile, tachycardic, hypotensive and tachypneic in the emergency room.  She was admitted to the hospital for sepsis secondary to acute UTI.    Assessment/Plan:   Principal Problem:   Sepsis ([place]) Active Problems:   Infection of urinary tract   Multiple sclerosis (HCC)   Upper extremity weakness   Weakness of upper extremity   Body mass index is 59.79 kg/m.  (Morbid obesity)  Sepsis secondary to acute UTI: Urine culture is growing Pseudomonas aeruginosa.  Continue empiric IV antibiotics and follow-up culture sensitivity report.  Hypokalemia: Replete potassium and monitor levels.  Magnesium is low normal.  Replete magnesium hospital because of recent diarrhea.  Diarrhea: Continue Imodium as needed for diarrhea  Multiple sclerosis, neuropathic pain: Continue baclofen, Cymbalta and gabapentin.  She prefers to hold off on diroximel fumarate.   Diet Order             Diet Heart Room service appropriate? Yes; Fluid consistency: Thin  Diet effective now                      Consultants: None  Procedures: None    Medications:    amantadine  100 mg Oral BID   baclofen  20 mg Oral BID   DULoxetine  30 mg Oral BID   gabapentin  200 mg Oral q morning   gabapentin  800 mg Oral QHS   heparin  5,000 Units Subcutaneous Q8H    potassium chloride  40 mEq Oral Q4H   Continuous Infusions:  cefTRIAXone (ROCEPHIN)  IV 1 g (09/03/21 2312)   magnesium sulfate bolus IVPB       Anti-infectives (From admission, onward)    Start     Dose/Rate Route Frequency Ordered Stop   09/03/21 1900  cefTRIAXone (ROCEPHIN) 1 g in sodium chloride 0.9 % 100 mL IVPB        1 g 200 mL/hr over 30 Minutes Intravenous Every 24 hours 09/02/21 2347     09/03/21 0000  cefTRIAXone (ROCEPHIN) 1 g in sodium chloride 0.9 % 100 mL IVPB  Status:  Discontinued        1 g 200 mL/hr over 30 Minutes Intravenous  Once 09/02/21 2347 09/02/21 2347   09/02/21 2345  cephALEXin (KEFLEX) capsule 500 mg  Status:  Discontinued        500 mg Oral 4 times daily 09/02/21 2344 09/02/21 2347   09/02/21 1900  cefTRIAXone (ROCEPHIN) 1 g in sodium chloride 0.9 % 100 mL IVPB        1 g 200 mL/hr over 30 Minutes Intravenous  Once 09/02/21 1847 09/02/21 1942              Family Communication/Anticipated D/C date and plan/Code Status   DVT prophylaxis: heparin injection 5,000 Units Start: 09/03/21 0600 SCDs Start: 09/03/21 0136     Code  Status: Full Code  Family Communication: None Disposition Plan: Plan to discharge home in 2 to 3 days   Status is: Inpatient  Remains inpatient appropriate because: On IV antibiotics           Subjective:   C/o abdominal pain (cramps) mainly on the right side close to the ostomy.  She had diarrhea last night but none today.  Objective:    Vitals:   09/03/21 1412 09/03/21 1942 09/04/21 0353 09/04/21 0756  BP: 117/77 136/82 120/75 119/74  Pulse: (!) 109 (!) 110 100 (!) 106  Resp: 19 20 20 18   Temp: 98 F (36.7 C) 99.1 F (37.3 C) 98.1 F (36.7 C) 98.2 F (36.8 C)  TempSrc: Oral Oral Oral Oral  SpO2: 99% 99% 95% 97%  Weight:      Height:       No data found.   Intake/Output Summary (Last 24 hours) at 09/04/2021 1323 Last data filed at 09/04/2021 1312 Gross per 24 hour  Intake 1678.33 ml   Output 2775 ml  Net -1096.67 ml   Filed Weights   09/02/21 1743  Weight: (!) 158 kg    Exam:  GEN: NAD SKIN: Warm and dry EYES: No pallor or icterus ENT: MMM CV: RRR PULM: CTA B ABD: soft, ND, NT, +BS, + urostomy with amber urine in the urine bag CNS: AAO x 3, paraplegic EXT: No edema or tenderness         Data Reviewed:   I have personally reviewed following labs and imaging studies:  Labs: Labs show the following:   Basic Metabolic Panel: Recent Labs  Lab 09/02/21 2046 09/04/21 0710  NA 137 140  K 3.6 2.9*  CL 104 109  CO2 22 24  GLUCOSE 105* 105*  BUN 10 10  CREATININE 0.51 0.41*  CALCIUM 8.7* 8.1*  MG  --  1.8  PHOS  --  3.0   GFR Estimated Creatinine Clearance: 126.1 mL/min (A) (by C-G formula based on SCr of 0.41 mg/dL (L)). Liver Function Tests: Recent Labs  Lab 09/02/21 2046  AST 19  ALT 13  ALKPHOS 41  BILITOT 1.0  PROT 7.5  ALBUMIN 3.4*   No results for input(s): LIPASE, AMYLASE in the last 168 hours. No results for input(s): AMMONIA in the last 168 hours. Coagulation profile No results for input(s): INR, PROTIME in the last 168 hours.  CBC: Recent Labs  Lab 09/02/21 1745  WBC 9.6  NEUTROABS 7.2  HGB 13.9  HCT 43.1  MCV 90.5  PLT 358   Cardiac Enzymes: No results for input(s): CKTOTAL, CKMB, CKMBINDEX, TROPONINI in the last 168 hours. BNP (last 3 results) No results for input(s): PROBNP in the last 8760 hours. CBG: No results for input(s): GLUCAP in the last 168 hours. D-Dimer: No results for input(s): DDIMER in the last 72 hours. Hgb A1c: No results for input(s): HGBA1C in the last 72 hours. Lipid Profile: No results for input(s): CHOL, HDL, LDLCALC, TRIG, CHOLHDL, LDLDIRECT in the last 72 hours. Thyroid function studies: No results for input(s): TSH, T4TOTAL, T3FREE, THYROIDAB in the last 72 hours.  Invalid input(s): FREET3 Anemia work up: No results for input(s): VITAMINB12, FOLATE, FERRITIN, TIBC, IRON,  RETICCTPCT in the last 72 hours. Sepsis Labs: Recent Labs  Lab 09/02/21 1745 09/03/21 0625  PROCALCITON  --  <0.10  WBC 9.6  --   LATICACIDVEN 1.1  --     Microbiology Recent Results (from the past 240 hour(s))  Blood Culture (routine x  2)     Status: None (Preliminary result)   Collection Time: 09/02/21  5:45 PM   Specimen: BLOOD  Result Value Ref Range Status   Specimen Description BLOOD BLOOD LEFT FOREARM  Final   Special Requests   Final    BOTTLES DRAWN AEROBIC AND ANAEROBIC Blood Culture adequate volume   Culture   Final    NO GROWTH 2 DAYS Performed at [facility], [address] [place], [place] [address]    Report Status PENDING  Incomplete  Blood Culture (routine x 2)     Status: None (Preliminary result)   Collection Time: 09/02/21  5:45 PM   Specimen: BLOOD  Result Value Ref Range Status   Specimen Description BLOOD LEFT ANTECUBITAL  Final   Special Requests   Final    BOTTLES DRAWN AEROBIC AND ANAEROBIC Blood Culture results may not be optimal due to an inadequate volume of blood received in culture bottles   Culture   Final    NO GROWTH 2 DAYS Performed at [facility], [address], [place], [place] [address]    Report Status PENDING  Incomplete  Urine Culture     Status: Abnormal (Preliminary result)   Collection Time: 09/02/21  5:45 PM   Specimen: In/Out Cath Urine  Result Value Ref Range Status   Specimen Description   Final    IN/OUT CATH URINE Performed at [facility], [address], [place], [place] [address]    Special Requests   Final    NONE Performed at [facility], [address], [place], [place] [address]    Culture (A)  Final    >=100,000 COLONIES/mL PSEUDOMONAS AERUGINOSA SUSCEPTIBILITIES TO FOLLOW Performed at [facility], [address], [place], [place] [address]    Report Status PENDING  Incomplete  Resp Panel by RT-PCR (Flu A&B, Covid)     Status: None   Collection Time:  09/02/21  5:45 PM   Specimen: Nasopharyngeal(NP) swabs in vial transport medium  Result Value Ref Range Status   SARS Coronavirus 2 by RT PCR NEGATIVE NEGATIVE Final    Comment: (NOTE) SARS-CoV-2 target nucleic acids are NOT DETECTED.  The SARS-CoV-2 RNA is generally detectable in upper respiratory specimens during the acute phase of infection. The lowest concentration of SARS-CoV-2 viral copies this assay can detect is 138 copies/mL. A negative result does not preclude SARS-Cov-2 infection and should not be used as the sole basis for treatment or other patient management decisions. A negative result may occur with  improper specimen collection/handling, submission of specimen other than nasopharyngeal swab, presence of viral mutation(s) within the areas targeted by this assay, and inadequate number of viral copies(<138 copies/mL). A negative result must be combined with clinical observations, patient history, and epidemiological information. The expected result is Negative.  Fact Sheet for Patients:  BloggerCourse.com  Fact Sheet for Healthcare Providers:  SeriousBroker.it  This test is no t yet approved or cleared by the [place] FDA and  has been authorized for detection and/or diagnosis of SARS-CoV-2 by FDA under an Emergency Use Authorization (EUA). This EUA will remain  in effect (meaning this test can be used) for the duration of the COVID-19 declaration under Section 564(b)(1) of the Act, 21 U.S.C.section 360bbb-3(b)(1), unless the authorization is terminated  or revoked sooner.       Influenza A by PCR NEGATIVE NEGATIVE Final   Influenza B by PCR NEGATIVE NEGATIVE Final    Comment: (NOTE) The Xpert  Xpress SARS-CoV-2/FLU/RSV plus assay is intended as an aid in the diagnosis of influenza from Nasopharyngeal swab specimens and should not be used as a sole basis for treatment. Nasal washings and aspirates are  unacceptable for Xpert Xpress SARS-CoV-2/FLU/RSV testing.  Fact Sheet for Patients: EntrepreneurPulse.com.au  Fact Sheet for Healthcare Providers: IncredibleEmployment.be  This test is not yet approved or cleared by the [place] FDA and has been authorized for detection and/or diagnosis of SARS-CoV-2 by FDA under an Emergency Use Authorization (EUA). This EUA will remain in effect (meaning this test can be used) for the duration of the COVID-19 declaration under Section 564(b)(1) of the Act, 21 U.S.C. section 360bbb-3(b)(1), unless the authorization is terminated or revoked.  Performed at [facility], [address] [place], [place] [address]     Procedures and diagnostic studies:  CT Angio Chest Pulmonary Embolism (PE) W or WO Contrast  Result Date: 09/03/2021 CLINICAL DATA:  Chest pain and shortness of breath, history of abdominal distension and diarrhea EXAM: CT ANGIOGRAPHY CHEST CT ABDOMEN AND PELVIS WITH CONTRAST TECHNIQUE: Multidetector CT imaging of the chest was performed using the standard protocol during bolus administration of intravenous contrast. Multiplanar CT image reconstructions and MIPs were obtained to evaluate the vascular anatomy. Multidetector CT imaging of the abdomen and pelvis was performed using the standard protocol during bolus administration of intravenous contrast. CONTRAST:  136mL OMNIPAQUE IOHEXOL 350 MG/ML SOLN COMPARISON:  Chest x-ray from the previous day. FINDINGS: CTA CHEST FINDINGS Cardiovascular: No evidence of aneurysmal dilatation or dissection is noted within the aorta. No cardiac enlargement is seen. No coronary calcifications are noted. The pulmonary artery as visualized is within normal limits without focal filling defect suggest pulmonary embolism. Mediastinum/Nodes: Thoracic inlet is within normal limits. No sizable hilar or mediastinal adenopathy is noted. The esophagus as visualized is  within normal limits. Lungs/Pleura: Lungs are well aerated with bibasilar atelectatic changes. No focal infiltrate, effusion or pneumothorax is seen. Musculoskeletal: Degenerative changes of the thoracic spine are noted. No acute rib abnormality is noted. No compression deformity is seen. Review of the MIP images confirms the above findings. CT ABDOMEN and PELVIS FINDINGS Hepatobiliary: Gallbladder is well distended with dependent gallstones. No wall thickening is noted. The liver is within normal limits. Pancreas: Unremarkable. No pancreatic ductal dilatation or surrounding inflammatory changes. Spleen: Normal in size without focal abnormality. Adrenals/Urinary Tract: Adrenal glands are within normal limits. Kidneys demonstrate a normal enhancement pattern. Scarring is noted bilaterally. No renal calculi or obstructive changes are seen. Delayed images demonstrate normal excretion of contrast material bilaterally. 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 The bladder is not visualized consistent with a prior surgical history. A right lower quadrant urostomy is noted. Stomach/Bowel: Fecal material is noted throughout the colon consistent with a degree of constipation. There are findings suspicious for fecal impaction in the rectum. No inflammatory changes to suggest stercoral colitis are seen. The appendix is not well visualized although no inflammatory changes to suggest appendicitis are noted. Small bowel and stomach are unremarkable. Vascular/Lymphatic: Aortic atherosclerosis. No enlarged abdominal or pelvic lymph nodes. Reproductive: Status post hysterectomy. No adnexal masses. Other: No abdominal wall hernia or abnormality. No abdominopelvic ascites. Musculoskeletal: Degenerative and postoperative changes of the lumbar spine are noted. No acute bony abnormality is seen. Review of the MIP images confirms the above findings. IMPRESSION: CTA of the chest: No evidence of pulmonary emboli. Bibasilar  atelectatic changes. CT of the abdomen and pelvis: Cholelithiasis without complicating factors. Changes suspicious  for fecal impaction within the rectum. Mild colonic constipation are is seen. No other focal abnormality is noted. Electronically Signed   By: Inez Catalina M.D.   On: 09/03/2021 00:32   CT ABDOMEN PELVIS W CONTRAST  Result Date: 09/03/2021 CLINICAL DATA:  Chest pain and shortness of breath, history of abdominal distension and diarrhea EXAM: CT ANGIOGRAPHY CHEST CT ABDOMEN AND PELVIS WITH CONTRAST TECHNIQUE: Multidetector CT imaging of the chest was performed using the standard protocol during bolus administration of intravenous contrast. Multiplanar CT image reconstructions and MIPs were obtained to evaluate the vascular anatomy. Multidetector CT imaging of the abdomen and pelvis was performed using the standard protocol during bolus administration of intravenous contrast. CONTRAST:  117mL OMNIPAQUE IOHEXOL 350 MG/ML SOLN COMPARISON:  Chest x-ray from the previous day. FINDINGS: CTA CHEST FINDINGS Cardiovascular: No evidence of aneurysmal dilatation or dissection is noted within the aorta. No cardiac enlargement is seen. No coronary calcifications are noted. The pulmonary artery as visualized is within normal limits without focal filling defect suggest pulmonary embolism. Mediastinum/Nodes: Thoracic inlet is within normal limits. No sizable hilar or mediastinal adenopathy is noted. The esophagus as visualized is within normal limits. Lungs/Pleura: Lungs are well aerated with bibasilar atelectatic changes. No focal infiltrate, effusion or pneumothorax is seen. Musculoskeletal: Degenerative changes of the thoracic spine are noted. No acute rib abnormality is noted. No compression deformity is seen. Review of the MIP images confirms the above findings. CT ABDOMEN and PELVIS FINDINGS Hepatobiliary: Gallbladder is well distended with dependent gallstones. No wall thickening is noted. The liver is within  normal limits. Pancreas: Unremarkable. No pancreatic ductal dilatation or surrounding inflammatory changes. Spleen: Normal in size without focal abnormality. Adrenals/Urinary Tract: Adrenal glands are within normal limits. Kidneys demonstrate a normal enhancement pattern. Scarring is noted bilaterally. No renal calculi or obstructive changes are seen. Delayed images demonstrate normal excretion of contrast material bilaterally. 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 The bladder is not visualized consistent with a prior surgical history. A right lower quadrant urostomy is noted. Stomach/Bowel: Fecal material is noted throughout the colon consistent with a degree of constipation. There are findings suspicious for fecal impaction in the rectum. No inflammatory changes to suggest stercoral colitis are seen. The appendix is not well visualized although no inflammatory changes to suggest appendicitis are noted. Small bowel and stomach are unremarkable. Vascular/Lymphatic: Aortic atherosclerosis. No enlarged abdominal or pelvic lymph nodes. Reproductive: Status post hysterectomy. No adnexal masses. Other: No abdominal wall hernia or abnormality. No abdominopelvic ascites. Musculoskeletal: Degenerative and postoperative changes of the lumbar spine are noted. No acute bony abnormality is seen. Review of the MIP images confirms the above findings. IMPRESSION: CTA of the chest: No evidence of pulmonary emboli. Bibasilar atelectatic changes. CT of the abdomen and pelvis: Cholelithiasis without complicating factors. Changes suspicious for fecal impaction within the rectum. Mild colonic constipation are is seen. No other focal abnormality is noted. Electronically Signed   By: Inez Catalina M.D.   On: 09/03/2021 00:32   DG Chest Port 1 View  Result Date: 09/02/2021 CLINICAL DATA:  Chest pain and shortness of breath this morning EXAM: PORTABLE CHEST 1 VIEW COMPARISON:  07/24/2018 FINDINGS: Low lung volumes are  present, causing crowding of the pulmonary vasculature. Suspected mild atelectasis at both lung bases. Cardiac shadow somewhat obscured by the diaphragms. Prominence of stool in the splenic flexure. The lungs appear otherwise clear. IMPRESSION: 1. Low lung volumes with mild atelectasis at both lung bases. 2. Prominence of  stool at the splenic flexure the colon. Electronically Signed   By: Van Clines M.D.   On: 09/02/2021 17:56   ECHOCARDIOGRAM COMPLETE  Result Date: 09/03/2021    ECHOCARDIOGRAM REPORT   Patient Name:   PEYTYN PETRUCELLI Date of Exam: 09/03/2021 Medical Rec #:  [id number]        Height:       64.0 in Accession #:    [id number]       Weight:       348.3 lb Date of Birth:  04/03/1971         BSA:          2.477 m Patient Age:    89 years         BP:           123/75 mmHg Patient Gender: F                HR:           117 bpm. Exam Location:  [facility] Procedure: 2D Echo, Color Doppler, Cardiac Doppler and Intracardiac            Opacification Agent Indications:     R07.9 Chest Pain  History:         Patient has prior history of Echocardiogram examinations. MS.  Sonographer:     Charmayne Sheer Referring Phys:  [place] Diagnosing Phys: Ida Rogue MD  Sonographer Comments: Suboptimal parasternal window and no subcostal window. Image acquisition challenging due to patient body habitus. IMPRESSIONS  1. Left ventricular ejection fraction, by estimation, is 60 to 65%. The left ventricle has normal function. The left ventricle has no regional wall motion abnormalities. Left ventricular diastolic parameters are consistent with Grade I diastolic dysfunction (impaired relaxation).  2. Right ventricular systolic function is normal. The right ventricular size is normal. There is normal pulmonary artery systolic pressure. The estimated right ventricular systolic pressure is [id number] mmHg.  3. The mitral valve is normal in structure. No evidence of mitral valve regurgitation. No evidence of mitral  stenosis.  4. The aortic valve is normal in structure. Aortic valve regurgitation is not visualized. No aortic stenosis is present.  5. The inferior vena cava is normal in size with greater than 50% respiratory variability, suggesting right atrial pressure of 3 mmHg. FINDINGS  Left Ventricle: Left ventricular ejection fraction, by estimation, is 60 to 65%. The left ventricle has normal function. The left ventricle has no regional wall motion abnormalities. Definity contrast agent was given IV to delineate the left ventricular  endocardial borders. The left ventricular internal cavity size was normal in size. There is no left ventricular hypertrophy. Left ventricular diastolic parameters are consistent with Grade I diastolic dysfunction (impaired relaxation). Right Ventricle: The right ventricular size is normal. No increase in right ventricular wall thickness. Right ventricular systolic function is normal. There is normal pulmonary artery systolic pressure. The tricuspid regurgitant velocity is 2.63 m/s, and  with an assumed right atrial pressure of 5 mmHg, the estimated right ventricular systolic pressure is [id number]. Left Atrium: Left atrial size was normal in size. Right Atrium: Right atrial size was normal in size. Pericardium: There is no evidence of pericardial effusion. Mitral Valve: The mitral valve is normal in structure. No evidence of mitral valve regurgitation. No evidence of mitral valve stenosis. MV peak gradient, 6.9 mmHg. The mean mitral valve gradient is 2.0 mmHg. Tricuspid Valve: The tricuspid valve is normal in structure. Tricuspid valve regurgitation is not demonstrated.  No evidence of tricuspid stenosis. Aortic Valve: The aortic valve is normal in structure. Aortic valve regurgitation is not visualized. No aortic stenosis is present. Aortic valve mean gradient measures 3.0 mmHg. Aortic valve peak gradient measures 5.4 mmHg. Aortic valve area, by VTI measures 3.34 cm. Pulmonic Valve: The  pulmonic valve was normal in structure. Pulmonic valve regurgitation is not visualized. No evidence of pulmonic stenosis. Aorta: The aortic root is normal in size and structure. Venous: The inferior vena cava is normal in size with greater than 50% respiratory variability, suggesting right atrial pressure of 3 mmHg. IAS/Shunts: No atrial level shunt detected by color flow Doppler.  LEFT VENTRICLE PLAX 2D LVIDd:         3.10 cm   Diastology LVIDs:         1.80 cm   LV e' medial:    11.90 cm/s LV PW:         0.90 cm   LV E/e' medial:  7.1 LV IVS:        0.70 cm   LV e' lateral:   4.46 cm/s LVOT diam:     2.20 cm   LV E/e' lateral: 18.9 LV SV:         52 LV SV Index:   21 LVOT Area:     3.80 cm  LEFT ATRIUM           Index LA Vol (A4C): 20.9 ml 8.44 ml/m  AORTIC VALVE                    PULMONIC VALVE AV Area (Vmax):    3.31 cm     PV Vmax:       0.91 m/s AV Area (Vmean):   3.56 cm     PV Vmean:      69.100 cm/s AV Area (VTI):     3.34 cm     PV VTI:        0.160 m AV Vmax:           116.00 cm/s  PV Peak grad:  3.3 mmHg AV Vmean:          73.500 cm/s  PV Mean grad:  2.0 mmHg AV VTI:            0.157 m AV Peak Grad:      5.4 mmHg AV Mean Grad:      3.0 mmHg LVOT Vmax:         101.00 cm/s LVOT Vmean:        68.800 cm/s LVOT VTI:          0.138 m LVOT/AV VTI ratio: 0.88  AORTA Ao Root diam: 2.70 cm MITRAL VALVE               TRICUSPID VALVE MV Area (PHT): 8.43 cm    TR Peak grad:   27.7 mmHg MV Area VTI:   2.38 cm    TR Vmax:        263.00 cm/s MV Peak grad:  6.9 mmHg MV Mean grad:  2.0 mmHg    SHUNTS MV Vmax:       1.31 m/s    Systemic VTI:  0.14 m MV Vmean:      70.6 cm/s   Systemic Diam: 2.20 cm MV Decel Time: 90 msec MV E velocity: 84.50 cm/s MV A velocity: 90.60 cm/s MV E/A ratio:  0.93 Ida Rogue MD Electronically signed by Ida Rogue MD Signature Date/Time: 09/03/2021/6:25:54 PM  Final                LOS: 1 day      [facility]   Pager on [email or web address]. If  7PM-7AM, please contact night-coverage at [email or web address]     09/04/2021, 1:23 PM

## 2021-09-05 LAB — URINE CULTURE: Culture: >=100000 — AB

## 2021-09-05 LAB — POTASSIUM: Potassium: 3.8 mmol/L (ref 3.5–5.1)

## 2021-09-05 LAB — BASIC METABOLIC PANEL
Anion gap: 9 (ref 5–15)
BUN: 12 mg/dL (ref 6–20)
CO2: 20 mmol/L — ABNORMAL LOW (ref 22–32)
Calcium: 8 mg/dL — ABNORMAL LOW (ref 8.9–10.3)
Chloride: 107 mmol/L (ref 98–111)
Creatinine, Ser: 0.61 mg/dL (ref 0.44–1.00)
GFR, Estimated: >60 mL/min (ref >60)
Glucose, Bld: 101 mg/dL — ABNORMAL HIGH (ref 70–99)
Potassium: 3.2 mmol/L — ABNORMAL LOW (ref 3.5–5.1)
Sodium: 136 mmol/L (ref 135–145)

## 2021-09-05 LAB — PHOSPHORUS: Phosphorus: 2.5 mg/dL (ref 2.5–4.6)

## 2021-09-05 LAB — MAGNESIUM: Magnesium: 2.1 mg/dL (ref 1.7–2.4)

## 2021-09-05 MED ORDER — POTASSIUM CHLORIDE CRYS ER 20 MEQ PO TBCR
40.0000 meq | EXTENDED_RELEASE_TABLET | Freq: Once | ORAL | Status: AC
  Administered 2021-09-05: 40 meq via ORAL
  Filled 2021-09-05: qty 2

## 2021-09-05 MED ORDER — POTASSIUM CHLORIDE IN NACL 20-0.9 MEQ/L-% IV SOLN
INTRAVENOUS | Status: AC
  Filled 2021-09-05 (×2): qty 1000

## 2021-09-05 NOTE — Progress Notes (Signed)
CCMD notified me regarding patient's HR sustaining in the 130's.  Notified NP, Bishop Limbo, and orders were placed for 500cc bolus of NS and lab draw for potassium.  Will continue to monitor.  Arturo Morton  09/05/2021  12:17 AM

## 2021-09-05 NOTE — Progress Notes (Addendum)
Progress Note    [name]  AOZ:[id number] DOB: 1970-10-13  DOA: 09/02/2021 PCP: [name], MD      Brief Narrative:    Medical records reviewed and are as summarized below:  [name] is a 50 y.o. female with medical history significant for primary progressive multiple sclerosis, neuropathic pain, paraplegia, who presented to the hospital because of weakness in her upper extremities.  She also complained of diarrhea after starting her new multiple sclerosis medication (diroximel fumarate).  She said she normally develops weakness in the upper extremities when she has an infection in the urine.  She was febrile, tachycardic, hypotensive and tachypneic in the emergency room.  She was admitted to the hospital for sepsis secondary to acute UTI.    Assessment/Plan:   Principal Problem:   Sepsis (HCC) Active Problems:   Infection of urinary tract   Multiple sclerosis (HCC)   Upper extremity weakness   Weakness of upper extremity   Body mass index is 59.79 kg/m.  (Morbid obesity)  Sepsis secondary to acute UTI: Urine culture shows Pseudomonas aeruginosa.  Sensitivity report is pending.  Continue IV cefepime.    Hypokalemia: Continue potassium repletion  Sinus tachycardia: Likely from fever and dehydration.  Hydrate with IV fluids.  Diarrhea: Start IV fluids because of diarrhea.  Imodium as needed for diarrhea.  Multiple sclerosis, neuropathic pain: Continue baclofen, Cymbalta and gabapentin.  She prefers to hold off on diroximel fumarate.   Diet Order             Diet Heart Room service appropriate? Yes; Fluid consistency: Thin  Diet effective now                      Consultants: None  Procedures: None    Medications:    amantadine  100 mg Oral BID   baclofen  20 mg Oral BID   DULoxetine  30 mg Oral BID   gabapentin  200 mg Oral q morning   gabapentin  800 mg Oral QHS   heparin  5,000 Units Subcutaneous Q8H    Continuous Infusions:  0.9 % NaCl with KCl 20 mEq / L     ceFEPime (MAXIPIME) IV Stopped (09/05/21 9629)     Anti-infectives (From admission, onward)    Start     Dose/Rate Route Frequency Ordered Stop   09/04/21 1430  ceFEPIme (MAXIPIME) 2 g in sodium chloride 0.9 % 100 mL IVPB        2 g 200 mL/hr over 30 Minutes Intravenous Every 8 hours 09/04/21 1337     09/03/21 1900  cefTRIAXone (ROCEPHIN) 1 g in sodium chloride 0.9 % 100 mL IVPB  Status:  Discontinued        1 g 200 mL/hr over 30 Minutes Intravenous Every 24 hours 09/02/21 2347 09/04/21 1337   09/03/21 0000  cefTRIAXone (ROCEPHIN) 1 g in sodium chloride 0.9 % 100 mL IVPB  Status:  Discontinued        1 g 200 mL/hr over 30 Minutes Intravenous  Once 09/02/21 2347 09/02/21 2347   09/02/21 2345  cephALEXin (KEFLEX) capsule 500 mg  Status:  Discontinued        500 mg Oral 4 times daily 09/02/21 2344 09/02/21 2347   09/02/21 1900  cefTRIAXone (ROCEPHIN) 1 g in sodium chloride 0.9 % 100 mL IVPB        1 g 200 mL/hr over 30 Minutes Intravenous  Once 09/02/21 1847 09/02/21  62              Family Communication/Anticipated D/C date and plan/Code Status   DVT prophylaxis: heparin injection 5,000 Units Start: 09/03/21 0600 SCDs Start: 09/03/21 0136     Code Status: Full Code  Family Communication: None Disposition Plan: Plan to discharge home in 2 to 3 days   Status is: Inpatient  Remains inpatient appropriate because: On IV antibiotics           Subjective:   C/o diarrhea.  She had fever with temperature of 100.5 F this morning.  Abdominal pain is better.  No vomiting.  Objective:    Vitals:   09/05/21 0739 09/05/21 0822 09/05/21 0823 09/05/21 0828  BP:  (!) 121/41    Pulse:  (!) 115    Resp: 20 18 18 20   Temp:  (!) 100.5 F (38.1 C)  98.7 F (37.1 C)  TempSrc:  Axillary  Oral  SpO2:  98%    Weight:      Height:       No data found.   Intake/Output Summary (Last 24 hours) at  09/05/2021 1131 Last data filed at 09/05/2021 1050 Gross per 24 hour  Intake 760.74 ml  Output 1628 ml  Net -867.26 ml   Filed Weights   09/02/21 1743  Weight: (!) 158 kg    Exam:  GEN: NAD SKIN: Warm and dry EYES: No pallor or icterus ENT: MMM CV: RRR, tachycardic PULM: CTA B ABD: soft, ND, NT, +BS, + urostomy CNS: AAO x 3, paraplegic EXT: No edema or tenderness         Data Reviewed:   I have personally reviewed following labs and imaging studies:  Labs: Labs show the following:   Basic Metabolic Panel: Recent Labs  Lab 09/02/21 2046 09/04/21 0710 09/04/21 2323 09/05/21 0558  NA 137 140  --  136  K 3.6 2.9* 3.8 3.2*  CL 104 109  --  107  CO2 22 24  --  20*  GLUCOSE 105* 105*  --  101*  BUN 10 10  --  12  CREATININE 0.51 0.41*  --  0.61  CALCIUM 8.7* 8.1*  --  8.0*  MG  --  1.8  --  2.1  PHOS  --  3.0  --   --    GFR Estimated Creatinine Clearance: 126.1 mL/min (by C-G formula based on SCr of 0.61 mg/dL). Liver Function Tests: Recent Labs  Lab 09/02/21 2046  AST 19  ALT 13  ALKPHOS 41  BILITOT 1.0  PROT 7.5  ALBUMIN 3.4*   No results for input(s): LIPASE, AMYLASE in the last 168 hours. No results for input(s): AMMONIA in the last 168 hours. Coagulation profile No results for input(s): INR, PROTIME in the last 168 hours.  CBC: Recent Labs  Lab 09/02/21 1745  WBC 9.6  NEUTROABS 7.2  HGB 13.9  HCT 43.1  MCV 90.5  PLT 358   Cardiac Enzymes: No results for input(s): CKTOTAL, CKMB, CKMBINDEX, TROPONINI in the last 168 hours. BNP (last 3 results) No results for input(s): PROBNP in the last 8760 hours. CBG: No results for input(s): GLUCAP in the last 168 hours. D-Dimer: No results for input(s): DDIMER in the last 72 hours. Hgb A1c: No results for input(s): HGBA1C in the last 72 hours. Lipid Profile: No results for input(s): CHOL, HDL, LDLCALC, TRIG, CHOLHDL, LDLDIRECT in the last 72 hours. Thyroid function studies: No results  for input(s): TSH, T4TOTAL, T3FREE, THYROIDAB in  the last 72 hours.  Invalid input(s): FREET3 Anemia work up: No results for input(s): VITAMINB12, FOLATE, FERRITIN, TIBC, IRON, RETICCTPCT in the last 72 hours. Sepsis Labs: Recent Labs  Lab 09/02/21 1745 09/03/21 0625  PROCALCITON  --  <0.10  WBC 9.6  --   LATICACIDVEN 1.1  --     Microbiology Recent Results (from the past 240 hour(s))  Blood Culture (routine x 2)     Status: None (Preliminary result)   Collection Time: 09/02/21  5:45 PM   Specimen: BLOOD  Result Value Ref Range Status   Specimen Description BLOOD BLOOD LEFT FOREARM  Final   Special Requests   Final    BOTTLES DRAWN AEROBIC AND ANAEROBIC Blood Culture adequate volume   Culture   Final    NO GROWTH 3 DAYS Performed at [facility], [address] [place], [place] [address]    Report Status PENDING  Incomplete  Blood Culture (routine x 2)     Status: None (Preliminary result)   Collection Time: 09/02/21  5:45 PM   Specimen: BLOOD  Result Value Ref Range Status   Specimen Description BLOOD LEFT ANTECUBITAL  Final   Special Requests   Final    BOTTLES DRAWN AEROBIC AND ANAEROBIC Blood Culture results may not be optimal due to an inadequate volume of blood received in culture bottles   Culture   Final    NO GROWTH 3 DAYS Performed at [facility], [address], [place], [place] [address]    Report Status PENDING  Incomplete  Urine Culture     Status: Abnormal (Preliminary result)   Collection Time: 09/02/21  5:45 PM   Specimen: In/Out Cath Urine  Result Value Ref Range Status   Specimen Description   Final    IN/OUT CATH URINE Performed at [facility], [address], [place], [place] [address]    Special Requests   Final    NONE Performed at [facility], [address], [place], [place] [address]    Culture (A)  Final    >=100,000 COLONIES/mL PSEUDOMONAS AERUGINOSA SUSCEPTIBILITIES TO FOLLOW Performed at  [facility], [address] [place], [place] [address]    Report Status PENDING  Incomplete  Resp Panel by RT-PCR (Flu A&B, Covid)     Status: None   Collection Time: 09/02/21  5:45 PM   Specimen: Nasopharyngeal(NP) swabs in vial transport medium  Result Value Ref Range Status   SARS Coronavirus 2 by RT PCR NEGATIVE NEGATIVE Final    Comment: (NOTE) SARS-CoV-2 target nucleic acids are NOT DETECTED.  The SARS-CoV-2 RNA is generally detectable in upper respiratory specimens during the acute phase of infection. The lowest concentration of SARS-CoV-2 viral copies this assay can detect is 138 copies/mL. A negative result does not preclude SARS-Cov-2 infection and should not be used as the sole basis for treatment or other patient management decisions. A negative result may occur with  improper specimen collection/handling, submission of specimen other than nasopharyngeal swab, presence of viral mutation(s) within the areas targeted by this assay, and inadequate number of viral copies(<138 copies/mL). A negative result must be combined with clinical observations, patient history, and epidemiological information. The expected result is Negative.  Fact Sheet for Patients:  BloggerCourse.com  Fact Sheet for Healthcare Providers:  SeriousBroker.it  This test is no t yet approved or cleared by the [place] FDA and  has been authorized for detection and/or diagnosis of SARS-CoV-2 by FDA under an Emergency  Use Authorization (EUA). This EUA will remain  in effect (meaning this test can be used) for the duration of the COVID-19 declaration under Section 564(b)(1) of the Act, 21 U.S.C.section 360bbb-3(b)(1), unless the authorization is terminated  or revoked sooner.       Influenza A by PCR NEGATIVE NEGATIVE Final   Influenza B by PCR NEGATIVE NEGATIVE Final    Comment: (NOTE) The Xpert Xpress SARS-CoV-2/FLU/RSV plus assay is  intended as an aid in the diagnosis of influenza from Nasopharyngeal swab specimens and should not be used as a sole basis for treatment. Nasal washings and aspirates are unacceptable for Xpert Xpress SARS-CoV-2/FLU/RSV testing.  Fact Sheet for Patients: BloggerCourse.com  Fact Sheet for Healthcare Providers: SeriousBroker.it  This test is not yet approved or cleared by the [place] FDA and has been authorized for detection and/or diagnosis of SARS-CoV-2 by FDA under an Emergency Use Authorization (EUA). This EUA will remain in effect (meaning this test can be used) for the duration of the COVID-19 declaration under Section 564(b)(1) of the Act, 21 U.S.C. section 360bbb-3(b)(1), unless the authorization is terminated or revoked.  Performed at [facility], [address], [place], [place] [address]     Procedures and diagnostic studies:  ECHOCARDIOGRAM COMPLETE  Result Date: 09/03/2021    ECHOCARDIOGRAM REPORT   Patient Name:   MAYLIN FREEBURG Date of Exam: 09/03/2021 Medical Rec #:  [id number]        Height:       64.0 in Accession #:    [id number]       Weight:       348.3 lb Date of Birth:  Oct 25, 1970         BSA:          2.477 m Patient Age:    50 years         BP:           123/75 mmHg Patient Gender: F                HR:           117 bpm. Exam Location:  [facility] Procedure: 2D Echo, Color Doppler, Cardiac Doppler and Intracardiac            Opacification Agent Indications:     R07.9 Chest Pain  History:         Patient has prior history of Echocardiogram examinations. MS.  Sonographer:     Humphrey Rolls Referring Phys:  [id number] Eliezer Mccoy PATEL Diagnosing Phys: Julien Nordmann MD  Sonographer Comments: Suboptimal parasternal window and no subcostal window. Image acquisition challenging due to patient body habitus. IMPRESSIONS  1. Left ventricular ejection fraction, by estimation, is 60 to 65%. The left ventricle has normal  function. The left ventricle has no regional wall motion abnormalities. Left ventricular diastolic parameters are consistent with Grade I diastolic dysfunction (impaired relaxation).  2. Right ventricular systolic function is normal. The right ventricular size is normal. There is normal pulmonary artery systolic pressure. The estimated right ventricular systolic pressure is 32.7 mmHg.  3. The mitral valve is normal in structure. No evidence of mitral valve regurgitation. No evidence of mitral stenosis.  4. The aortic valve is normal in structure. Aortic valve regurgitation is not visualized. No aortic stenosis is present.  5. The inferior vena cava is normal in size with greater than 50% respiratory variability, suggesting right atrial pressure of 3 mmHg. FINDINGS  Left Ventricle: Left ventricular ejection fraction, by estimation, is  60 to 65%. The left ventricle has normal function. The left ventricle has no regional wall motion abnormalities. Definity contrast agent was given IV to delineate the left ventricular  endocardial borders. The left ventricular internal cavity size was normal in size. There is no left ventricular hypertrophy. Left ventricular diastolic parameters are consistent with Grade I diastolic dysfunction (impaired relaxation). Right Ventricle: The right ventricular size is normal. No increase in right ventricular wall thickness. Right ventricular systolic function is normal. There is normal pulmonary artery systolic pressure. The tricuspid regurgitant velocity is 2.63 m/s, and  with an assumed right atrial pressure of 5 mmHg, the estimated right ventricular systolic pressure is 32.7 mmHg. Left Atrium: Left atrial size was normal in size. Right Atrium: Right atrial size was normal in size. Pericardium: There is no evidence of pericardial effusion. Mitral Valve: The mitral valve is normal in structure. No evidence of mitral valve regurgitation. No evidence of mitral valve stenosis. MV peak  gradient, 6.9 mmHg. The mean mitral valve gradient is 2.0 mmHg. Tricuspid Valve: The tricuspid valve is normal in structure. Tricuspid valve regurgitation is not demonstrated. No evidence of tricuspid stenosis. Aortic Valve: The aortic valve is normal in structure. Aortic valve regurgitation is not visualized. No aortic stenosis is present. Aortic valve mean gradient measures 3.0 mmHg. Aortic valve peak gradient measures 5.4 mmHg. Aortic valve area, by VTI measures 3.34 cm. Pulmonic Valve: The pulmonic valve was normal in structure. Pulmonic valve regurgitation is not visualized. No evidence of pulmonic stenosis. Aorta: The aortic root is normal in size and structure. Venous: The inferior vena cava is normal in size with greater than 50% respiratory variability, suggesting right atrial pressure of 3 mmHg. IAS/Shunts: No atrial level shunt detected by color flow Doppler.  LEFT VENTRICLE PLAX 2D LVIDd:         3.10 cm   Diastology LVIDs:         1.80 cm   LV e' medial:    11.90 cm/s LV PW:         0.90 cm   LV E/e' medial:  7.1 LV IVS:        0.70 cm   LV e' lateral:   4.46 cm/s LVOT diam:     2.20 cm   LV E/e' lateral: 18.9 LV SV:         52 LV SV Index:   21 LVOT Area:     3.80 cm  LEFT ATRIUM           Index LA Vol (A4C): 20.9 ml 8.44 ml/m  AORTIC VALVE                    PULMONIC VALVE AV Area (Vmax):    3.31 cm     PV Vmax:       0.91 m/s AV Area (Vmean):   3.56 cm     PV Vmean:      69.100 cm/s AV Area (VTI):     3.34 cm     PV VTI:        0.160 m AV Vmax:           116.00 cm/s  PV Peak grad:  3.3 mmHg AV Vmean:          73.500 cm/s  PV Mean grad:  2.0 mmHg AV VTI:            0.157 m AV Peak Grad:      5.4 mmHg AV Mean Grad:  3.0 mmHg LVOT Vmax:         101.00 cm/s LVOT Vmean:        68.800 cm/s LVOT VTI:          0.138 m LVOT/AV VTI ratio: 0.88  AORTA Ao Root diam: 2.70 cm MITRAL VALVE               TRICUSPID VALVE MV Area (PHT): 8.43 cm    TR Peak grad:   27.7 mmHg MV Area VTI:   2.38 cm    TR  Vmax:        263.00 cm/s MV Peak grad:  6.9 mmHg MV Mean grad:  2.0 mmHg    SHUNTS MV Vmax:       1.31 m/s    Systemic VTI:  0.14 m MV Vmean:      70.6 cm/s   Systemic Diam: 2.20 cm MV Decel Time: 90 msec MV E velocity: 84.50 cm/s MV A velocity: 90.60 cm/s MV E/A ratio:  0.93 Julien Nordmann MD Electronically signed by Julien Nordmann MD Signature Date/Time: 09/03/2021/6:25:54 PM    Final                LOS: 2 days      [facility]   Pager on [email or web address]. If 7PM-7AM, please contact night-coverage at [email or web address]     09/05/2021, 11:31 AM

## 2021-09-06 LAB — BASIC METABOLIC PANEL
Anion gap: 6 (ref 5–15)
BUN: 11 mg/dL (ref 6–20)
CO2: 19 mmol/L — ABNORMAL LOW (ref 22–32)
Calcium: 8.1 mg/dL — ABNORMAL LOW (ref 8.9–10.3)
Chloride: 110 mmol/L (ref 98–111)
Creatinine, Ser: 0.53 mg/dL (ref 0.44–1.00)
GFR, Estimated: >60 mL/min (ref >60)
Glucose, Bld: 93 mg/dL (ref 70–99)
Potassium: 3.7 mmol/L (ref 3.5–5.1)
Sodium: 135 mmol/L (ref 135–145)

## 2021-09-06 LAB — MAGNESIUM: Magnesium: 2.1 mg/dL (ref 1.7–2.4)

## 2021-09-06 MED ORDER — LEVOFLOXACIN 500 MG PO TABS: 500.0000 mg | ORAL_TABLET | Freq: Every day | ORAL | 0 refills | Status: AC

## 2021-09-06 NOTE — Discharge Summary (Signed)
Physician Discharge Summary  [name] [id number] DOB: 1971-03-30 DOA: 09/02/2021  PCP: Hoyt Koch, MD  Admit date: 09/02/2021 Discharge date: 09/06/2021  Discharge disposition: Home   Recommendations for Outpatient Follow-Up:   Follow-up with PCP in 1 week Follow-up with neurologist as needed.   Discharge Diagnosis:   Principal Problem:   Sepsis ([place]) Active Problems:   Infection of urinary tract   Multiple sclerosis (HCC)   Upper extremity weakness   Weakness of upper extremity    Discharge Condition: Stable.  Diet recommendation:  Diet Order             Diet - low sodium heart healthy           Diet Heart Room service appropriate? Yes; Fluid consistency: Thin  Diet effective now                     Code Status: Full Code     Hospital Course:   Ms. [name] is a 50 y.o. female with medical history significant for primary progressive multiple sclerosis, neuropathic pain, paraplegia, who presented to the hospital because of weakness in her upper extremities.  She also complained of diarrhea after starting her new multiple sclerosis medication (diroximel fumarate).  She said she normally develops weakness in the upper extremities when she has an infection in the urine.  She was febrile, tachycardic, hypotensive and tachypneic in the emergency room.   She was admitted to the hospital for sepsis secondary to acute UTI.  She was initially treated with IV ceftriaxone.  She was also treated with IV fluids for dehydration and she was given potassium supplement for hypokalemia.  Urine culture showed Pseudomonas aeruginosa and IV ceftriaxone was switched to IV cefepime.  Her condition has improved.  She feels she is back to her baseline.  Weakness in the upper extremities have improved.  She has decided to discontinue diroximel and should follow-up with her neurologist for further recommendations.  Her condition has improved and she is deemed  stable for discharge to home today.Marland Kitchen       Discharge Exam:    Vitals:   09/05/21 0828 09/05/21 1601 09/05/21 1922 09/06/21 0433  BP:  107/66 (!) 113/48 (!) 119/44  Pulse:  (!) 105 (!) 108 92  Resp: 20 18 16 16   Temp: 98.7 F (37.1 C) 98.5 F (36.9 C) 98.5 F (36.9 C) 98.3 F (36.8 C)  TempSrc: Oral Oral Oral Oral  SpO2:  97% 100% 99%  Weight:      Height:         GEN: NAD SKIN: Warm and dry EYES: EOMI ENT: MMM CV: RRR PULM: CTA B ABD: soft, obese, NT, +BS, +urostomy CNS: AAO x 3, paraplegic EXT: No edema or tenderness   The results of significant diagnostics from this hospitalization (including imaging, microbiology, ancillary and laboratory) are listed below for reference.     Procedures and Diagnostic Studies:   CT Angio Chest Pulmonary Embolism (PE) W or WO Contrast  Result Date: 09/03/2021 CLINICAL DATA:  Chest pain and shortness of breath, history of abdominal distension and diarrhea EXAM: CT ANGIOGRAPHY CHEST CT ABDOMEN AND PELVIS WITH CONTRAST TECHNIQUE: Multidetector CT imaging of the chest was performed using the standard protocol during bolus administration of intravenous contrast. Multiplanar CT image reconstructions and MIPs were obtained to evaluate the vascular anatomy. Multidetector CT imaging of the abdomen and pelvis was performed using the standard protocol during bolus administration of intravenous contrast.  CONTRAST:  136mL OMNIPAQUE IOHEXOL 350 MG/ML SOLN COMPARISON:  Chest x-ray from the previous day. FINDINGS: CTA CHEST FINDINGS Cardiovascular: No evidence of aneurysmal dilatation or dissection is noted within the aorta. No cardiac enlargement is seen. No coronary calcifications are noted. The pulmonary artery as visualized is within normal limits without focal filling defect suggest pulmonary embolism. Mediastinum/Nodes: Thoracic inlet is within normal limits. No sizable hilar or mediastinal adenopathy is noted. The esophagus as visualized is within  normal limits. Lungs/Pleura: Lungs are well aerated with bibasilar atelectatic changes. No focal infiltrate, effusion or pneumothorax is seen. Musculoskeletal: Degenerative changes of the thoracic spine are noted. No acute rib abnormality is noted. No compression deformity is seen. Review of the MIP images confirms the above findings. CT ABDOMEN and PELVIS FINDINGS Hepatobiliary: Gallbladder is well distended with dependent gallstones. No wall thickening is noted. The liver is within normal limits. Pancreas: Unremarkable. No pancreatic ductal dilatation or surrounding inflammatory changes. Spleen: Normal in size without focal abnormality. Adrenals/Urinary Tract: Adrenal glands are within normal limits. Kidneys demonstrate a normal enhancement pattern. Scarring is noted bilaterally. No renal calculi or obstructive changes are seen. Delayed images demonstrate normal excretion of contrast material bilaterally. 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 The bladder is not visualized consistent with a prior surgical history. A right lower quadrant urostomy is noted. Stomach/Bowel: Fecal material is noted throughout the colon consistent with a degree of constipation. There are findings suspicious for fecal impaction in the rectum. No inflammatory changes to suggest stercoral colitis are seen. The appendix is not well visualized although no inflammatory changes to suggest appendicitis are noted. Small bowel and stomach are unremarkable. Vascular/Lymphatic: Aortic atherosclerosis. No enlarged abdominal or pelvic lymph nodes. Reproductive: Status post hysterectomy. No adnexal masses. Other: No abdominal wall hernia or abnormality. No abdominopelvic ascites. Musculoskeletal: Degenerative and postoperative changes of the lumbar spine are noted. No acute bony abnormality is seen. Review of the MIP images confirms the above findings. IMPRESSION: CTA of the chest: No evidence of pulmonary emboli. Bibasilar atelectatic  changes. CT of the abdomen and pelvis: Cholelithiasis without complicating factors. Changes suspicious for fecal impaction within the rectum. Mild colonic constipation are is seen. No other focal abnormality is noted. Electronically Signed   By: Inez Catalina M.D.   On: 09/03/2021 00:32   CT ABDOMEN PELVIS W CONTRAST  Result Date: 09/03/2021 CLINICAL DATA:  Chest pain and shortness of breath, history of abdominal distension and diarrhea EXAM: CT ANGIOGRAPHY CHEST CT ABDOMEN AND PELVIS WITH CONTRAST TECHNIQUE: Multidetector CT imaging of the chest was performed using the standard protocol during bolus administration of intravenous contrast. Multiplanar CT image reconstructions and MIPs were obtained to evaluate the vascular anatomy. Multidetector CT imaging of the abdomen and pelvis was performed using the standard protocol during bolus administration of intravenous contrast. CONTRAST:  174mL OMNIPAQUE IOHEXOL 350 MG/ML SOLN COMPARISON:  Chest x-ray from the previous day. FINDINGS: CTA CHEST FINDINGS Cardiovascular: No evidence of aneurysmal dilatation or dissection is noted within the aorta. No cardiac enlargement is seen. No coronary calcifications are noted. The pulmonary artery as visualized is within normal limits without focal filling defect suggest pulmonary embolism. Mediastinum/Nodes: Thoracic inlet is within normal limits. No sizable hilar or mediastinal adenopathy is noted. The esophagus as visualized is within normal limits. Lungs/Pleura: Lungs are well aerated with bibasilar atelectatic changes. No focal infiltrate, effusion or pneumothorax is seen. Musculoskeletal: Degenerative changes of the thoracic spine are noted. No acute rib abnormality is noted. No  compression deformity is seen. Review of the MIP images confirms the above findings. CT ABDOMEN and PELVIS FINDINGS Hepatobiliary: Gallbladder is well distended with dependent gallstones. No wall thickening is noted. The liver is within normal  limits. Pancreas: Unremarkable. No pancreatic ductal dilatation or surrounding inflammatory changes. Spleen: Normal in size without focal abnormality. Adrenals/Urinary Tract: Adrenal glands are within normal limits. Kidneys demonstrate a normal enhancement pattern. Scarring is noted bilaterally. No renal calculi or obstructive changes are seen. Delayed images demonstrate normal excretion of contrast material bilaterally. 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 The bladder is not visualized consistent with a prior surgical history. A right lower quadrant urostomy is noted. Stomach/Bowel: Fecal material is noted throughout the colon consistent with a degree of constipation. There are findings suspicious for fecal impaction in the rectum. No inflammatory changes to suggest stercoral colitis are seen. The appendix is not well visualized although no inflammatory changes to suggest appendicitis are noted. Small bowel and stomach are unremarkable. Vascular/Lymphatic: Aortic atherosclerosis. No enlarged abdominal or pelvic lymph nodes. Reproductive: Status post hysterectomy. No adnexal masses. Other: No abdominal wall hernia or abnormality. No abdominopelvic ascites. Musculoskeletal: Degenerative and postoperative changes of the lumbar spine are noted. No acute bony abnormality is seen. Review of the MIP images confirms the above findings. IMPRESSION: CTA of the chest: No evidence of pulmonary emboli. Bibasilar atelectatic changes. CT of the abdomen and pelvis: Cholelithiasis without complicating factors. Changes suspicious for fecal impaction within the rectum. Mild colonic constipation are is seen. No other focal abnormality is noted. Electronically Signed   By: Inez Catalina M.D.   On: 09/03/2021 00:32   ECHOCARDIOGRAM COMPLETE  Result Date: 09/03/2021    ECHOCARDIOGRAM REPORT   Patient Name:   [name] Date of Exam: 09/03/2021 Medical Rec #:  [id number]        Height:       64.0 in Accession #:     [id number]       Weight:       348.3 lb Date of Birth:  Aug 19, 1971         BSA:          2.477 m Patient Age:    24 years         BP:           123/75 mmHg Patient Gender: F                HR:           117 bpm. Exam Location:  [facility] Procedure: 2D Echo, Color Doppler, Cardiac Doppler and Intracardiac            Opacification Agent Indications:     R07.9 Chest Pain  History:         Patient has prior history of Echocardiogram examinations. MS.  Sonographer:     Charmayne Sheer Referring Phys:  [place] Diagnosing Phys: Ida Rogue MD  Sonographer Comments: Suboptimal parasternal window and no subcostal window. Image acquisition challenging due to patient body habitus. IMPRESSIONS  1. Left ventricular ejection fraction, by estimation, is 60 to 65%. The left ventricle has normal function. The left ventricle has no regional wall motion abnormalities. Left ventricular diastolic parameters are consistent with Grade I diastolic dysfunction (impaired relaxation).  2. Right ventricular systolic function is normal. The right ventricular size is normal. There is normal pulmonary artery systolic pressure. The estimated right ventricular systolic pressure is [id number] mmHg.  3. The mitral valve  is normal in structure. No evidence of mitral valve regurgitation. No evidence of mitral stenosis.  4. The aortic valve is normal in structure. Aortic valve regurgitation is not visualized. No aortic stenosis is present.  5. The inferior vena cava is normal in size with greater than 50% respiratory variability, suggesting right atrial pressure of 3 mmHg. FINDINGS  Left Ventricle: Left ventricular ejection fraction, by estimation, is 60 to 65%. The left ventricle has normal function. The left ventricle has no regional wall motion abnormalities. Definity contrast agent was given IV to delineate the left ventricular  endocardial borders. The left ventricular internal cavity size was normal in size. There is no left ventricular hypertrophy.  Left ventricular diastolic parameters are consistent with Grade I diastolic dysfunction (impaired relaxation). Right Ventricle: The right ventricular size is normal. No increase in right ventricular wall thickness. Right ventricular systolic function is normal. There is normal pulmonary artery systolic pressure. The tricuspid regurgitant velocity is 2.63 m/s, and  with an assumed right atrial pressure of 5 mmHg, the estimated right ventricular systolic pressure is [id number]. Left Atrium: Left atrial size was normal in size. Right Atrium: Right atrial size was normal in size. Pericardium: There is no evidence of pericardial effusion. Mitral Valve: The mitral valve is normal in structure. No evidence of mitral valve regurgitation. No evidence of mitral valve stenosis. MV peak gradient, 6.9 mmHg. The mean mitral valve gradient is 2.0 mmHg. Tricuspid Valve: The tricuspid valve is normal in structure. Tricuspid valve regurgitation is not demonstrated. No evidence of tricuspid stenosis. Aortic Valve: The aortic valve is normal in structure. Aortic valve regurgitation is not visualized. No aortic stenosis is present. Aortic valve mean gradient measures 3.0 mmHg. Aortic valve peak gradient measures 5.4 mmHg. Aortic valve area, by VTI measures 3.34 cm. Pulmonic Valve: The pulmonic valve was normal in structure. Pulmonic valve regurgitation is not visualized. No evidence of pulmonic stenosis. Aorta: The aortic root is normal in size and structure. Venous: The inferior vena cava is normal in size with greater than 50% respiratory variability, suggesting right atrial pressure of 3 mmHg. IAS/Shunts: No atrial level shunt detected by color flow Doppler.  LEFT VENTRICLE PLAX 2D LVIDd:         3.10 cm   Diastology LVIDs:         1.80 cm   LV e' medial:    11.90 cm/s LV PW:         0.90 cm   LV E/e' medial:  7.1 LV IVS:        0.70 cm   LV e' lateral:   4.46 cm/s LVOT diam:     2.20 cm   LV E/e' lateral: 18.9 LV SV:         52 LV  SV Index:   21 LVOT Area:     3.80 cm  LEFT ATRIUM           Index LA Vol (A4C): 20.9 ml 8.44 ml/m  AORTIC VALVE                    PULMONIC VALVE AV Area (Vmax):    3.31 cm     PV Vmax:       0.91 m/s AV Area (Vmean):   3.56 cm     PV Vmean:      69.100 cm/s AV Area (VTI):     3.34 cm     PV VTI:        0.160 m  AV Vmax:           116.00 cm/s  PV Peak grad:  3.3 mmHg AV Vmean:          73.500 cm/s  PV Mean grad:  2.0 mmHg AV VTI:            0.157 m AV Peak Grad:      5.4 mmHg AV Mean Grad:      3.0 mmHg LVOT Vmax:         101.00 cm/s LVOT Vmean:        68.800 cm/s LVOT VTI:          0.138 m LVOT/AV VTI ratio: 0.88  AORTA Ao Root diam: 2.70 cm MITRAL VALVE               TRICUSPID VALVE MV Area (PHT): 8.43 cm    TR Peak grad:   27.7 mmHg MV Area VTI:   2.38 cm    TR Vmax:        263.00 cm/s MV Peak grad:  6.9 mmHg MV Mean grad:  2.0 mmHg    SHUNTS MV Vmax:       1.31 m/s    Systemic VTI:  0.14 m MV Vmean:      70.6 cm/s   Systemic Diam: 2.20 cm MV Decel Time: 90 msec MV E velocity: 84.50 cm/s MV A velocity: 90.60 cm/s MV E/A ratio:  0.93 Ida Rogue MD Electronically signed by Ida Rogue MD Signature Date/Time: 09/03/2021/6:25:54 PM    Final      Labs:   Basic Metabolic Panel: Recent Labs  Lab 09/02/21 2046 09/04/21 0710 09/04/21 2323 09/05/21 0558 09/06/21 0420  NA 137 140  --  136 135  K 3.6 2.9*   < > 3.2* 3.7  CL 104 109  --  107 110  CO2 22 24  --  20* 19*  GLUCOSE 105* 105*  --  101* 93  BUN 10 10  --  12 11  CREATININE 0.51 0.41*  --  0.61 0.53  CALCIUM 8.7* 8.1*  --  8.0* 8.1*  MG  --  1.8  --  2.1 2.1  PHOS  --  3.0  --  2.5  --    < > = values in this interval not displayed.   GFR Estimated Creatinine Clearance: 126.1 mL/min (by C-G formula based on SCr of 0.53 mg/dL). Liver Function Tests: Recent Labs  Lab 09/02/21 2046  AST 19  ALT 13  ALKPHOS 41  BILITOT 1.0  PROT 7.5  ALBUMIN 3.4*   No results for input(s): LIPASE, AMYLASE in the last 168 hours. No  results for input(s): AMMONIA in the last 168 hours. Coagulation profile No results for input(s): INR, PROTIME in the last 168 hours.  CBC: Recent Labs  Lab 09/02/21 1745  WBC 9.6  NEUTROABS 7.2  HGB 13.9  HCT 43.1  MCV 90.5  PLT 358   Cardiac Enzymes: No results for input(s): CKTOTAL, CKMB, CKMBINDEX, TROPONINI in the last 168 hours. BNP: Invalid input(s): POCBNP CBG: No results for input(s): GLUCAP in the last 168 hours. D-Dimer No results for input(s): DDIMER in the last 72 hours. Hgb A1c No results for input(s): HGBA1C in the last 72 hours. Lipid Profile No results for input(s): CHOL, HDL, LDLCALC, TRIG, CHOLHDL, LDLDIRECT in the last 72 hours. Thyroid function studies No results for input(s): TSH, T4TOTAL, T3FREE, THYROIDAB in the last 72 hours.  Invalid input(s): FREET3 Anemia work up No results  for input(s): VITAMINB12, FOLATE, FERRITIN, TIBC, IRON, RETICCTPCT in the last 72 hours. Microbiology Recent Results (from the past 240 hour(s))  Blood Culture (routine x 2)     Status: None (Preliminary result)   Collection Time: 09/02/21  5:45 PM   Specimen: BLOOD  Result Value Ref Range Status   Specimen Description BLOOD BLOOD LEFT FOREARM  Final   Special Requests   Final    BOTTLES DRAWN AEROBIC AND ANAEROBIC Blood Culture adequate volume   Culture   Final    NO GROWTH 4 DAYS Performed at [facility], [address], [place], [place] [address]    Report Status PENDING  Incomplete  Blood Culture (routine x 2)     Status: None (Preliminary result)   Collection Time: 09/02/21  5:45 PM   Specimen: BLOOD  Result Value Ref Range Status   Specimen Description BLOOD LEFT ANTECUBITAL  Final   Special Requests   Final    BOTTLES DRAWN AEROBIC AND ANAEROBIC Blood Culture results may not be optimal due to an inadequate volume of blood received in culture bottles   Culture   Final    NO GROWTH 4 DAYS Performed at [facility], [address],  [place], [place] [address]    Report Status PENDING  Incomplete  Urine Culture     Status: Abnormal   Collection Time: 09/02/21  5:45 PM   Specimen: In/Out Cath Urine  Result Value Ref Range Status   Specimen Description   Final    IN/OUT CATH URINE Performed at [facility], [address], [place], [place] [address]    Special Requests   Final    NONE Performed at [facility], [address] [place], [place] [address]    Culture >=100,000 COLONIES/mL PSEUDOMONAS AERUGINOSA (A)  Final   Report Status 09/05/2021 FINAL  Final   Organism ID, Bacteria PSEUDOMONAS AERUGINOSA (A)  Final      Susceptibility   Pseudomonas aeruginosa - MIC*    CEFTAZIDIME 4 SENSITIVE Sensitive     CIPROFLOXACIN <=0.25 SENSITIVE Sensitive     GENTAMICIN 2 SENSITIVE Sensitive     IMIPENEM 2 SENSITIVE Sensitive     PIP/TAZO 8 SENSITIVE Sensitive     CEFEPIME 4 SENSITIVE Sensitive     * >=100,000 COLONIES/mL PSEUDOMONAS AERUGINOSA  Resp Panel by RT-PCR (Flu A&B, Covid)     Status: None   Collection Time: 09/02/21  5:45 PM   Specimen: Nasopharyngeal(NP) swabs in vial transport medium  Result Value Ref Range Status   SARS Coronavirus 2 by RT PCR NEGATIVE NEGATIVE Final    Comment: (NOTE) SARS-CoV-2 target nucleic acids are NOT DETECTED.  The SARS-CoV-2 RNA is generally detectable in upper respiratory specimens during the acute phase of infection. The lowest concentration of SARS-CoV-2 viral copies this assay can detect is 138 copies/mL. A negative result does not preclude SARS-Cov-2 infection and should not be used as the sole basis for treatment or other patient management decisions. A negative result may occur with  improper specimen collection/handling, submission of specimen other than nasopharyngeal swab, presence of viral mutation(s) within the areas targeted by this assay, and inadequate number of viral copies(<138 copies/mL). A negative result must be combined with clinical  observations, patient history, and epidemiological information. The expected result is Negative.  Fact Sheet for Patients:  EntrepreneurPulse.com.au  Fact Sheet for Healthcare Providers:  IncredibleEmployment.be  This test is no t yet approved or cleared by the [place] and  has been authorized for detection and/or diagnosis of SARS-CoV-2 by FDA under an Emergency Use Authorization (EUA). This EUA will remain  in effect (meaning this test can be used) for the duration of the COVID-19 declaration under Section 564(b)(1) of the Act, 21 U.S.C.section 360bbb-3(b)(1), unless the authorization is terminated  or revoked sooner.       Influenza A by PCR NEGATIVE NEGATIVE Final   Influenza B by PCR NEGATIVE NEGATIVE Final    Comment: (NOTE) The Xpert Xpress SARS-CoV-2/FLU/RSV plus assay is intended as an aid in the diagnosis of influenza from Nasopharyngeal swab specimens and should not be used as a sole basis for treatment. Nasal washings and aspirates are unacceptable for Xpert Xpress SARS-CoV-2/FLU/RSV testing.  Fact Sheet for Patients: EntrepreneurPulse.com.au  Fact Sheet for Healthcare Providers: IncredibleEmployment.be  This test is not yet approved or cleared by the [place] FDA and has been authorized for detection and/or diagnosis of SARS-CoV-2 by FDA under an Emergency Use Authorization (EUA). This EUA will remain in effect (meaning this test can be used) for the duration of the COVID-19 declaration under Section 564(b)(1) of the Act, 21 U.S.C. section 360bbb-3(b)(1), unless the authorization is terminated or revoked.  Performed at [facility], [address] [place], [place] [address]      Discharge Instructions:   Discharge Instructions     Diet - low sodium heart healthy   Complete by: As directed    Discharge instructions   Complete by: As directed    Follow up  with your neurologist as needed   Increase activity slowly   Complete by: As directed       Allergies as of 09/06/2021       Reactions   Latex Swelling, Other (See Comments)   Reaction:  Facial swelling         Medication List     STOP taking these medications    cephALEXin 500 MG capsule Commonly known as: KEFLEX   lactulose 10 GM/15ML solution Commonly known as: CHRONULAC   polyethylene glycol 17 g packet Commonly known as: MIRALAX / GLYCOLAX   senna-docusate 8.6-50 MG tablet Commonly known as: Senokot-S   Vumerity (Starter) 231 MG Cpdr Generic drug: Diroximel Fumarate (Starter)       TAKE these medications    acetaminophen 500 MG tablet Commonly known as: TYLENOL Take 500 mg by mouth every 6 (six) hours as needed.   amantadine 100 MG capsule Commonly known as: SYMMETREL Take 100 mg by mouth 2 (two) times daily.   baclofen 20 MG tablet Commonly known as: LIORESAL Take 20 mg by mouth 2 (two) times daily.   cholecalciferol 1000 units tablet Commonly known as: VITAMIN D Take 1,000 Units by mouth daily.   Cranberry 400 MG Caps Take 400 mg by mouth daily.   docusate sodium 100 MG capsule Commonly known as: COLACE Take 100 mg by mouth 2 (two) times daily as needed for mild constipation.   DULoxetine 30 MG capsule Commonly known as: CYMBALTA Take 30 mg by mouth 2 (two) times daily.   gabapentin 100 MG capsule Commonly known as: NEURONTIN Take 200 mg by mouth every morning.   gabapentin 800 MG tablet Commonly known as: NEURONTIN Take 800 mg by mouth at bedtime.   levofloxacin 500 MG tablet Commonly known as: Levaquin Take 1 tablet (500 mg total) by mouth daily for 4 days. Start taking on: September 07, 2021   Ozempic (1 MG/DOSE) 4 MG/3ML Sopn Generic drug: Semaglutide (1 MG/DOSE) Inject  1 mg into the skin once a week. On Monday           If you experience worsening of your admission symptoms, develop shortness of breath, life  threatening emergency, suicidal or homicidal thoughts you must seek medical attention immediately by calling 911 or calling your MD immediately  if symptoms less severe.   You must read complete instructions/literature along with all the possible adverse reactions/side effects for all the medicines you take and that have been prescribed to you. Take any new medicines after you have completely understood and accept all the possible adverse reactions/side effects.    Please note   You were cared for by a hospitalist during your hospital stay. If you have any questions about your discharge medications or the care you received while you were in the hospital after you are discharged, you can call the unit and asked to speak with the hospitalist on call if the hospitalist that took care of you is not available. Once you are discharged, your primary care physician will handle any further medical issues. Please note that NO REFILLS for any discharge medications will be authorized once you are discharged, as it is imperative that you return to your primary care physician (or establish a relationship with a primary care physician if you do not have one) for your aftercare needs so that they can reassess your need for medications and monitor your lab values.       Time coordinating discharge: 32 minutes  Signed:     [facility] 09/06/2021, 10:44 AM   Pager on [email or web address]. If 7PM-7AM, please contact night-coverage at [email or web address]

## 2021-09-06 NOTE — Progress Notes (Signed)
[name] to be D/C'd Home per MD order.  Discussed prescriptions and follow up appointments with the patient. Prescriptions sent to pts pharmacy, medication list explained in detail. Pt verbalized understanding.    IV and tele box removed. Pt awaiting EMS for transport      Rigoberto Noel

## 2021-09-06 NOTE — Progress Notes (Signed)
Sutures removed per MD order, pt tolerated well.

## 2021-09-06 NOTE — TOC CM/SW Note (Addendum)
Patient has orders to discharge home today. Chart reviewed. PCP is Lauren Goldbeck, MD. On room air. No wounds. Has chronic urostomy. Uses power chair at home. No TOC needs identified. CSW signing off.   , CSW [phone]  11:51 am: Per RN, patient will need EMS transport home. Met with patient and confirmed. Address on facesheet is correct. Someone will be there whenever she arrives to let her in. Notified patient that she will likely be billed for transport. Patient wants to wait until after she eats her lunch and she is wanting to see if someone can take out her stitches. RN is aware. RN will notify CSW when transport can be arranged.   , CSW [phone]  1:53 pm: Transport has been arranged. She is 6th on the list. No further concerns. CSW signing off.   , CSW [phone]   

## 2021-09-06 NOTE — Care Management Important Message (Signed)
Important Message  Patient Details  Name: [name] MRN: [id number] Date of Birth: [birth year]   Medicare Important Message Given:  Yes     Johnell Comings 09/06/2021, 11:58 AM

## 2021-09-07 LAB — CULTURE, BLOOD (ROUTINE X 2)
Culture: NO GROWTH
Culture: NO GROWTH
Special Requests: ADEQUATE

## 2021-09-08 NOTE — Unmapped (Signed)
Internal Medicine Clinic Visit    Reason for visit: Post-Hospitalization Follow Up    A/P:           No diagnosis found.    1. Hospital discharge follow-up - Admission for Urosepsis in setting of neurogenic bladder 2/2 to MS  Admitted to [facility] on 09/02/21 for urosepsis likely 2/2 to neurogenic bladder from MS. While admitted treated with IV Rocephin, no urine Cx data available on Care Everywhere.   - Ambulatory referral to Internal Medicine  - Finish ABx course Rx'd by [place]  - Continue good hydration regimen (look into buying water bottle with timed volume goals on it)  - Continue daily bowel regimen, Miralax daily with AM meds.     No follow-ups on file.    Staffed with Dr. Lennie Muckle, seen and discussed    __________________________________________________________    HPI:    50 year old female with PMH of MS c/b paraplegia, chronic constipation, neurogenic bladder, Hx of MDR UTI and ileal loop urinary diversion with recent admission to [facility] for PsA urosepsis.    Presented on 09/02/21 with CC of bilateral upper extremity weakness, fast heart rate and fatigue and was admitted for urosepsis with hospital course notable for initial Abx treatment of IV Rocephin, which was then transitioned to IV cefepime once Urine Cx demonstrated pansensitive PsA. Was sent out on Levaquin PO.     Since being discharged she says she is feeling better and that she has her energy level increasing. Has finished her PO Abx course today. We discussed trying to optimize her hydration and bowel regimen to try our best to prevent future UTIs.    Medications prescribed or ordered upon discharge were reviewed today and reconciled with the most recent outpatient medication list.  Medication reconciliation was conducted by a prescribing practitioner, or clinical pharmacist.    __________________________________________________________      Medications:  Reviewed in EPIC  __________________________________________________________    Physical Exam:   Vital Signs:  There were no vitals filed for this visit.       PTHomeBP    The patient???s Average Home Blood Pressure during the last two weeks is :    /               Gen: chronically ill appearing, NAD. In Power Wheelchair  CV: RRR, no murmurs  Pulm: CTA bilaterally, no crackles or wheezes  Abd: Soft, NTND, normal BS. Ostomy in place, mucosa pink and healthy appearing.  Ext: Non-pitting LE edema bilaterally      PHQ-9 Score:     GAD-7 Score:       Medication adherence and barriers to the treatment plan have been addressed. Opportunities to optimize healthy behaviors have been discussed. Patient / caregiver voiced understanding.    Please excuse any typos in this documentation, it was written using Scientist, clinical (histocompatibility and immunogenetics).     Camelia Phenes MD  [facility] Internal Medicine PGY-1  [phone]

## 2021-09-09 DIAGNOSIS — Z09 Encounter for follow-up examination after completed treatment for conditions other than malignant neoplasm: Principal | ICD-10-CM

## 2021-09-10 ENCOUNTER — Ambulatory Visit: Admit: 2021-09-10 | Discharge: 2021-09-11 | Payer: MEDICARE

## 2021-09-10 DIAGNOSIS — Z09 Encounter for follow-up examination after completed treatment for conditions other than malignant neoplasm: Principal | ICD-10-CM

## 2021-09-10 DIAGNOSIS — K5909 Other constipation: Principal | ICD-10-CM

## 2021-09-10 MED ORDER — POLYETHYLENE GLYCOL 3350 17 GRAM ORAL POWDER PACKET
PACK | Freq: Every day | ORAL | 0 refills | 90.00000 days | Status: CP
Start: 2021-09-10 — End: ?

## 2021-09-10 NOTE — Unmapped (Addendum)
Ms. Doristine Counter it was a pleasure meeting you in clinic today we discussed the following items.  1.  In order to help prevent future UTIs is important to keep on top of your hydration, we discussed drinking at least 8 12 ounce bottles of water, or getting 1 of those large water bottles with time marks to keep you on topical hydration ([employer] 128 Ounce Bottle W/ Pop Straw)  2.  Also help prevent future UTIs we want to keep on top of your bowel movements by having you take daily MiraLAX with your a.m. meds.  3.  Finish your course of antibiotics prescribed by [place] ending tomorrow 09/11/2021

## 2021-09-22 NOTE — Unmapped (Signed)
Specialty Medication(s): Vumerity    Katie Mullen has been dis-enrolled from the [facility] specialty pharmacy services due to medication disctontiunation. Patient stopped medication on 12/8 and was subsequently hospitalized with urosepsis. Patient reports she has seen no difference in worsening of symptoms since stopping Vumerity and does not wish to restart it. She states she left a message for her doctor (neurologist, Dr. Yevonne Pax) that she was stopping Vumerity.     Additional information provided to the patient: Discussed I will inform prescriber Worthy Flank, PharmD, CPP) she stopped taking Vumerity due to recent urosepsis. Patient stated she would appreciate this. Also provided patient with [facility] phone number if she had any questions or concerns.     [name] [employer]  [facility] [employer] Specialty Pharmacist

## 2021-10-28 DIAGNOSIS — G35 Multiple sclerosis: Principal | ICD-10-CM

## 2021-10-28 MED ORDER — BACLOFEN 20 MG TABLET
ORAL_TABLET | Freq: Two times a day (BID) | ORAL | 0 refills | 45 days | Status: CP
Start: 2021-10-28 — End: ?

## 2021-10-30 DIAGNOSIS — Z9889 Other specified postprocedural states: Principal | ICD-10-CM

## 2021-10-30 DIAGNOSIS — G35 Multiple sclerosis: Principal | ICD-10-CM

## 2021-11-02 ENCOUNTER — Encounter: Admit: 2021-11-02 | Payer: MEDICARE

## 2021-11-02 ENCOUNTER — Inpatient Hospital Stay: Admit: 2021-11-02 | Payer: MEDICARE

## 2021-11-03 ENCOUNTER — Ambulatory Visit
Admit: 2021-11-03 | Discharge: 2021-11-03 | Payer: MEDICARE | Attending: Student in an Organized Health Care Education/Training Program | Primary: Student in an Organized Health Care Education/Training Program

## 2021-11-03 DIAGNOSIS — R238 Other skin changes: Principal | ICD-10-CM

## 2021-11-03 DIAGNOSIS — L89322 Pressure ulcer of left buttock, stage 2: Principal | ICD-10-CM

## 2021-11-03 DIAGNOSIS — G35 Multiple sclerosis: Principal | ICD-10-CM

## 2021-11-03 DIAGNOSIS — N319 Neuromuscular dysfunction of bladder, unspecified: Principal | ICD-10-CM

## 2021-11-04 DIAGNOSIS — G35 Multiple sclerosis: Principal | ICD-10-CM

## 2021-11-04 DIAGNOSIS — Z9889 Other specified postprocedural states: Principal | ICD-10-CM

## 2021-11-09 DIAGNOSIS — G35 Multiple sclerosis: Principal | ICD-10-CM

## 2021-11-09 DIAGNOSIS — Z9889 Other specified postprocedural states: Principal | ICD-10-CM

## 2021-11-11 ENCOUNTER — Ambulatory Visit
Admit: 2021-11-11 | Discharge: 2021-11-12 | Payer: MEDICARE | Attending: Student in an Organized Health Care Education/Training Program | Primary: Student in an Organized Health Care Education/Training Program

## 2021-11-11 DIAGNOSIS — R6889 Other general symptoms and signs: Principal | ICD-10-CM

## 2021-11-17 ENCOUNTER — Telehealth
Admit: 2021-11-17 | Discharge: 2021-11-18 | Payer: MEDICARE | Attending: Pharmacist Clinician (PhC)/ Clinical Pharmacy Specialist | Primary: Pharmacist Clinician (PhC)/ Clinical Pharmacy Specialist

## 2021-11-17 DIAGNOSIS — U071 COVID-19: Principal | ICD-10-CM

## 2021-11-17 DIAGNOSIS — G35 Multiple sclerosis: Principal | ICD-10-CM

## 2021-11-25 MED ORDER — OZEMPIC 1 MG/DOSE (2 MG/1.5 ML) SUBCUTANEOUS PEN INJECTOR
SUBCUTANEOUS | 2 refills | 28 days | Status: CP
Start: 2021-11-25 — End: ?

## 2021-12-01 DIAGNOSIS — G35 Multiple sclerosis: Principal | ICD-10-CM

## 2021-12-01 MED ORDER — GABAPENTIN 100 MG CAPSULE
ORAL_CAPSULE | Freq: Every day | ORAL | 3 refills | 135 days
Start: 2021-12-01 — End: ?

## 2021-12-01 MED ORDER — GABAPENTIN 800 MG TABLET
ORAL_TABLET | Freq: Every evening | ORAL | 5 refills | 60 days
Start: 2021-12-01 — End: ?

## 2021-12-01 MED ORDER — OZEMPIC 1 MG/DOSE (2 MG/1.5 ML) SUBCUTANEOUS PEN INJECTOR
SUBCUTANEOUS | 2 refills | 28 days
Start: 2021-12-01 — End: ?

## 2021-12-01 MED ORDER — BACLOFEN 20 MG TABLET
ORAL_TABLET | Freq: Two times a day (BID) | ORAL | 0 refills | 45 days
Start: 2021-12-01 — End: ?

## 2021-12-03 ENCOUNTER — Emergency Department: Payer: Medicare Other

## 2021-12-03 ENCOUNTER — Inpatient Hospital Stay
Admission: EM | Admit: 2021-12-03 | Discharge: 2021-12-06 | DRG: 193 | Disposition: A | Discharge status: Home Health | Payer: Medicare Other | Attending: Internal Medicine | Admitting: Internal Medicine

## 2021-12-03 ENCOUNTER — Other Ambulatory Visit: Payer: Self-pay

## 2021-12-03 DIAGNOSIS — Z79899 Other long term (current) drug therapy: Secondary | ICD-10-CM

## 2021-12-03 DIAGNOSIS — K592 Neurogenic bowel, not elsewhere classified: Secondary | ICD-10-CM | POA: Diagnosis present

## 2021-12-03 DIAGNOSIS — L8990 Pressure ulcer of unspecified site, unspecified stage: Secondary | ICD-10-CM | POA: Insufficient documentation

## 2021-12-03 DIAGNOSIS — K760 Fatty (change of) liver, not elsewhere classified: Secondary | ICD-10-CM

## 2021-12-03 DIAGNOSIS — Z7401 Bed confinement status: Secondary | ICD-10-CM

## 2021-12-03 DIAGNOSIS — Z789 Other specified health status: Secondary | ICD-10-CM | POA: Diagnosis not present

## 2021-12-03 DIAGNOSIS — R197 Diarrhea, unspecified: Secondary | ICD-10-CM | POA: Diagnosis present

## 2021-12-03 DIAGNOSIS — E669 Obesity, unspecified: Secondary | ICD-10-CM | POA: Diagnosis present

## 2021-12-03 DIAGNOSIS — Z936 Other artificial openings of urinary tract status: Secondary | ICD-10-CM

## 2021-12-03 DIAGNOSIS — J189 Pneumonia, unspecified organism: Secondary | ICD-10-CM | POA: Diagnosis not present

## 2021-12-03 DIAGNOSIS — N319 Neuromuscular dysfunction of bladder, unspecified: Secondary | ICD-10-CM | POA: Diagnosis present

## 2021-12-03 DIAGNOSIS — R29898 Other symptoms and signs involving the musculoskeletal system: Secondary | ICD-10-CM

## 2021-12-03 DIAGNOSIS — Z23 Encounter for immunization: Secondary | ICD-10-CM

## 2021-12-03 DIAGNOSIS — G35 Multiple sclerosis: Secondary | ICD-10-CM | POA: Diagnosis present

## 2021-12-03 DIAGNOSIS — R051 Acute cough: Secondary | ICD-10-CM | POA: Diagnosis not present

## 2021-12-03 DIAGNOSIS — Z6837 Body mass index (BMI) 37.0-37.9, adult: Secondary | ICD-10-CM

## 2021-12-03 DIAGNOSIS — J181 Lobar pneumonia, unspecified organism: Principal | ICD-10-CM | POA: Diagnosis present

## 2021-12-03 DIAGNOSIS — R7989 Other specified abnormal findings of blood chemistry: Secondary | ICD-10-CM

## 2021-12-03 DIAGNOSIS — A419 Sepsis, unspecified organism: Secondary | ICD-10-CM

## 2021-12-03 DIAGNOSIS — G8250 Quadriplegia, unspecified: Secondary | ICD-10-CM | POA: Diagnosis present

## 2021-12-03 DIAGNOSIS — R059 Cough, unspecified: Secondary | ICD-10-CM | POA: Diagnosis present

## 2021-12-03 DIAGNOSIS — R112 Nausea with vomiting, unspecified: Secondary | ICD-10-CM | POA: Diagnosis present

## 2021-12-03 DIAGNOSIS — L89322 Pressure ulcer of left buttock, stage 2: Secondary | ICD-10-CM | POA: Diagnosis present

## 2021-12-03 DIAGNOSIS — Z20822 Contact with and (suspected) exposure to covid-19: Secondary | ICD-10-CM | POA: Diagnosis present

## 2021-12-03 LAB — CBC WITH DIFFERENTIAL/PLATELET
Abs Immature Granulocytes: 0.05 10*3/uL (ref 0.00–0.07)
Basophils Absolute: 0.1 10*3/uL (ref 0.0–0.1)
Basophils Relative: 1 %
Eosinophils Absolute: 0.3 10*3/uL (ref 0.0–0.5)
Eosinophils Relative: 2 %
HCT: 41.9 % (ref 36.0–46.0)
Hemoglobin: 13.2 g/dL (ref 12.0–15.0)
Immature Granulocytes: 0 %
Lymphocytes Relative: 19 %
Lymphs Abs: 2.3 10*3/uL (ref 0.7–4.0)
MCH: 29 pg (ref 26.0–34.0)
MCHC: 31.5 g/dL (ref 30.0–36.0)
MCV: 92.1 fL (ref 80.0–100.0)
Monocytes Absolute: 1.8 10*3/uL — ABNORMAL HIGH (ref 0.1–1.0)
Monocytes Relative: 15 %
Neutro Abs: 7.6 10*3/uL (ref 1.7–7.7)
Neutrophils Relative %: 63 %
Platelets: 279 10*3/uL (ref 150–400)
RBC: 4.55 MIL/uL (ref 3.87–5.11)
RDW: 14.2 % (ref 11.5–15.5)
WBC: 12.1 10*3/uL — ABNORMAL HIGH (ref 4.0–10.5)
nRBC: 0 % (ref 0.0–0.2)

## 2021-12-03 LAB — BASIC METABOLIC PANEL
Anion gap: 9 (ref 5–15)
BUN: 13 mg/dL (ref 6–20)
CO2: 27 mmol/L (ref 22–32)
Calcium: 8.9 mg/dL (ref 8.9–10.3)
Chloride: 101 mmol/L (ref 98–111)
Creatinine, Ser: 0.62 mg/dL (ref 0.44–1.00)
GFR, Estimated: >60 mL/min (ref >60)
Glucose, Bld: 80 mg/dL (ref 70–99)
Potassium: 3.9 mmol/L (ref 3.5–5.1)
Sodium: 137 mmol/L (ref 135–145)

## 2021-12-03 LAB — LACTIC ACID, PLASMA: Lactic Acid, Venous: 1 mmol/L (ref 0.5–1.9)

## 2021-12-03 LAB — RESP PANEL BY RT-PCR (FLU A&B, COVID) ARPGX2
Influenza A by PCR: NEGATIVE
Influenza B by PCR: NEGATIVE
SARS Coronavirus 2 by RT PCR: NEGATIVE

## 2021-12-03 LAB — TROPONIN I (HIGH SENSITIVITY): Troponin I (High Sensitivity): 5 ng/L (ref <18)

## 2021-12-03 LAB — D-DIMER, QUANTITATIVE: D-Dimer, Quant: 1.01 ug/mL-FEU — ABNORMAL HIGH (ref 0.00–0.50)

## 2021-12-03 MED ORDER — GABAPENTIN 100 MG PO CAPS
200.0000 mg | ORAL_CAPSULE | Freq: Every morning | ORAL | Status: DC
  Administered 2021-12-04 – 2021-12-06 (×3): 200 mg via ORAL
  Filled 2021-12-03 (×3): qty 2

## 2021-12-03 MED ORDER — ONDANSETRON HCL 4 MG PO TABS: 4.0000 mg | ORAL_TABLET | Freq: Four times a day (QID) | ORAL | Status: DC | PRN

## 2021-12-03 MED ORDER — SODIUM CHLORIDE 0.9 % IV SOLN
1.0000 g | Freq: Once | INTRAVENOUS | Status: AC
  Administered 2021-12-03: 1 g via INTRAVENOUS
  Filled 2021-12-03: qty 10

## 2021-12-03 MED ORDER — SENNOSIDES-DOCUSATE SODIUM 8.6-50 MG PO TABS
1.0000 | ORAL_TABLET | Freq: Every day | ORAL | Status: DC
  Administered 2021-12-04: 1 via ORAL
  Filled 2021-12-03: qty 1

## 2021-12-03 MED ORDER — ALBUTEROL SULFATE HFA 108 (90 BASE) MCG/ACT IN AERS
2.0000 | INHALATION_SPRAY | RESPIRATORY_TRACT | Status: DC | PRN
Start: 2021-12-03 — End: 2021-12-04
  Filled 2021-12-03 (×2): qty 6.7

## 2021-12-03 MED ORDER — VITAMIN D 25 MCG (1000 UNIT) PO TABS
1000.0000 [IU] | ORAL_TABLET | Freq: Every day | ORAL | Status: DC
  Administered 2021-12-04 – 2021-12-06 (×3): 1000 [IU] via ORAL
  Filled 2021-12-03 (×3): qty 1

## 2021-12-03 MED ORDER — GABAPENTIN 400 MG PO CAPS
800.0000 mg | ORAL_CAPSULE | Freq: Every day | ORAL | Status: DC
  Administered 2021-12-04 – 2021-12-05 (×3): 800 mg via ORAL
  Filled 2021-12-03 (×5): qty 2

## 2021-12-03 MED ORDER — AZITHROMYCIN 500 MG IV SOLR
500.0000 mg | Freq: Once | INTRAVENOUS | Status: AC
Start: 2021-12-03 — End: 2021-12-03
  Administered 2021-12-03: 500 mg via INTRAVENOUS
  Filled 2021-12-03: qty 5

## 2021-12-03 MED ORDER — ENOXAPARIN SODIUM 60 MG/0.6ML IJ SOSY
0.5000 mg/kg | PREFILLED_SYRINGE | INTRAMUSCULAR | Status: DC
  Administered 2021-12-04 – 2021-12-06 (×3): 50 mg via SUBCUTANEOUS
  Filled 2021-12-03 (×3): qty 0.6

## 2021-12-03 MED ORDER — DULOXETINE HCL 30 MG PO CPEP
30.0000 mg | ORAL_CAPSULE | Freq: Two times a day (BID) | ORAL | Status: DC
  Administered 2021-12-04 – 2021-12-06 (×6): 30 mg via ORAL
  Filled 2021-12-03 (×8): qty 1

## 2021-12-03 MED ORDER — CRANBERRY 400 MG PO CAPS: 400.0000 mg | ORAL_CAPSULE | Freq: Every day | ORAL | Status: DC

## 2021-12-03 MED ORDER — LACTATED RINGERS IV BOLUS
1000.0000 mL | Freq: Once | INTRAVENOUS | Status: AC
  Administered 2021-12-03: 1000 mL via INTRAVENOUS

## 2021-12-03 MED ORDER — SODIUM CHLORIDE 0.9 % IV SOLN: INTRAVENOUS | Status: DC

## 2021-12-03 MED ORDER — ACETAMINOPHEN 650 MG RE SUPP: 650.0000 mg | Freq: Four times a day (QID) | RECTAL | Status: DC | PRN

## 2021-12-03 MED ORDER — IOHEXOL 350 MG/ML SOLN
80.0000 mL | Freq: Once | INTRAVENOUS | Status: AC | PRN
  Administered 2021-12-03: 80 mL via INTRAVENOUS

## 2021-12-03 MED ORDER — LACTATED RINGERS IV BOLUS
500.0000 mL | Freq: Once | INTRAVENOUS | Status: AC
  Administered 2021-12-04: 500 mL via INTRAVENOUS

## 2021-12-03 MED ORDER — BACLOFEN 10 MG PO TABS
20.0000 mg | ORAL_TABLET | Freq: Two times a day (BID) | ORAL | Status: DC
  Administered 2021-12-04 – 2021-12-06 (×6): 20 mg via ORAL
  Filled 2021-12-03 (×8): qty 2

## 2021-12-03 MED ORDER — ONDANSETRON HCL 4 MG/2ML IJ SOLN
4.0000 mg | Freq: Once | INTRAMUSCULAR | Status: AC
  Administered 2021-12-03: 4 mg via INTRAVENOUS
  Filled 2021-12-03: qty 2

## 2021-12-03 MED ORDER — AMANTADINE HCL 100 MG PO CAPS
100.0000 mg | ORAL_CAPSULE | Freq: Two times a day (BID) | ORAL | Status: DC
  Administered 2021-12-04 – 2021-12-06 (×6): 100 mg via ORAL
  Filled 2021-12-03 (×8): qty 1

## 2021-12-03 MED ORDER — SODIUM CHLORIDE 0.9 % IV SOLN
500.0000 mg | INTRAVENOUS | Status: DC
  Filled 2021-12-03: qty 5

## 2021-12-03 MED ORDER — GUAIFENESIN ER 600 MG PO TB12
600.0000 mg | ORAL_TABLET | Freq: Two times a day (BID) | ORAL | Status: DC
  Administered 2021-12-04 – 2021-12-06 (×5): 600 mg via ORAL
  Filled 2021-12-03 (×5): qty 1

## 2021-12-03 MED ORDER — SODIUM CHLORIDE 0.9 % IV SOLN
1.0000 g | Freq: Once | INTRAVENOUS | Status: AC
  Administered 2021-12-04: 1 g via INTRAVENOUS
  Filled 2021-12-03: qty 10

## 2021-12-03 MED ORDER — ACETAMINOPHEN 325 MG PO TABS
650.0000 mg | ORAL_TABLET | Freq: Four times a day (QID) | ORAL | Status: DC | PRN
  Administered 2021-12-05 – 2021-12-06 (×2): 650 mg via ORAL
  Filled 2021-12-03 (×3): qty 2

## 2021-12-03 MED ORDER — SODIUM CHLORIDE 0.9 % IV SOLN
2.0000 g | INTRAVENOUS | Status: DC
  Administered 2021-12-04 – 2021-12-05 (×2): 2 g via INTRAVENOUS
  Filled 2021-12-03 (×3): qty 20

## 2021-12-03 MED ORDER — ONDANSETRON HCL 4 MG/2ML IJ SOLN: 4.0000 mg | Freq: Four times a day (QID) | INTRAMUSCULAR | Status: DC | PRN

## 2021-12-03 MED ORDER — HYDROCOD POLI-CHLORPHE POLI ER 10-8 MG/5ML PO SUER: 5.0000 mL | Freq: Every evening | ORAL | Status: DC | PRN

## 2021-12-03 MED ORDER — OZEMPIC 1 MG/DOSE (2 MG/1.5 ML) SUBCUTANEOUS PEN INJECTOR
SUBCUTANEOUS | 2 refills | 28 days
Start: 2021-12-03 — End: ?

## 2021-12-03 MED ORDER — BACLOFEN 20 MG TABLET
ORAL_TABLET | Freq: Two times a day (BID) | ORAL | 0 refills | 45 days | Status: CP
Start: 2021-12-03 — End: ?

## 2021-12-03 MED ORDER — GABAPENTIN 100 MG CAPSULE
ORAL_CAPSULE | Freq: Every day | ORAL | 3 refills | 135 days | Status: CP
Start: 2021-12-03 — End: ?

## 2021-12-03 MED ORDER — GABAPENTIN 800 MG TABLET
ORAL_TABLET | Freq: Every evening | ORAL | 5 refills | 60 days | Status: CP
Start: 2021-12-03 — End: ?

## 2021-12-03 NOTE — ED Notes (Signed)
Unable to obtain labs, attempted x2. Lab contacted for collection. ?

## 2021-12-03 NOTE — ED Notes (Signed)
Lab at bedside

## 2021-12-03 NOTE — ED Provider Notes (Signed)
[facility] Provider Note    Event Date/Time   First MD Initiated Contact with Patient 12/03/21 1807     (approximate)   History   Shortness of Breath   HPI  [name] is a 51 y.o. female with a past medical history MS bedbound at baseline with very little function and feeling below the umbilicus with neurogenic bladder with chronic indwelling Foley who presents for assessment of 1 week of cough, congestion, pruritus of chest pain and shortness of breath.  She states he thinks he had a fever earlier today was not sure.  No vomiting, diarrhea, hemoptysis, headache, earache and sore throat or any other clear associated sick symptoms.  No history of DVT or PE.    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 --      Excl. in GC? --     Most recent vital signs: Vitals:   12/03/21 1821 12/03/21 1845  BP:    Pulse: 97 98  Resp: 14 20  Temp:    SpO2: 98% 98%    General: Awake, no distress.  CV:  Good peripheral perfusion.  Resp:  Normal effort.  Some rhonchi bilaterally.  No wheezing. Abd:  No distention.  Soft. Other:  No significant extremity edema.   ED Results / Procedures / Treatments  Labs (all labs ordered are  listed, but only abnormal results are displayed) Labs Reviewed  CBC WITH DIFFERENTIAL/PLATELET - Abnormal; Notable for the following components:      Result Value   WBC 12.1 (*)    Monocytes Absolute 1.8 (*)    All other components within normal limits  D-DIMER, QUANTITATIVE - Abnormal; Notable for the following components:   D-Dimer, Quant 1.01 (*)    All other components within normal limits  RESP PANEL BY RT-PCR (FLU A&B, COVID) ARPGX2  CULTURE, BLOOD (ROUTINE X 2)  CULTURE, BLOOD (ROUTINE X 2)  BASIC METABOLIC PANEL  LACTIC ACID, PLASMA  URINALYSIS, COMPLETE (UACMP) WITH MICROSCOPIC  PROCALCITONIN  HIV ANTIBODY (ROUTINE TESTING W REFLEX)  BASIC METABOLIC PANEL  CBC  TROPONIN I (HIGH SENSITIVITY)     EKG  ECGs remarkable symptom ventricular rate of 116, normal axis, with very minimal artifact throughout without evidence of clear acute ischemia.  RADIOLOGY  Chest X-ray interpreted myself bilateral lower lobe atelectasis possibly early developing elevation in the right lower lobe without evidence of other acute thoracic process such as pneumonia, effusion or pneumothorax.  I also viewed radiology interpretation and agree with the findings of same.  CTA chest my interpretation without evidence of acute, large effusion, pneumothorax but does show left lower lobe pneumonia.  Also viewed radiology interpretation and agree with the findings of same as left lower lobe pneumonia versus aspiration and fatty liver.    PROCEDURES:  Critical Care performed: Yes, see critical care procedure note(s)  .Critical Care Performed by: Gilles Chiquito, MD Authorized by: Gilles Chiquito, MD   Critical care provider statement:    Critical care time (minutes):  30   Critical care was necessary to treat or prevent imminent or life-threatening deterioration of the following conditions:  Sepsis   Critical care was time spent personally by me on the following activities:  Development of  treatment plan with patient or surrogate, discussions with consultants, evaluation of patient's response to treatment, examination of patient, ordering and review of laboratory studies, ordering and review of radiographic studies, ordering and performing treatments and interventions, pulse oximetry, re-evaluation of patient's condition and review of old charts    MEDICATIONS ORDERED IN ED: Medications  albuterol (VENTOLIN HFA) 108 (90 Base) MCG/ACT inhaler 2 puff (has no administration in time range)  azithromycin (ZITHROMAX) 500 mg in sodium chloride 0.9 % 250 mL IVPB (500 mg Intravenous New Bag/Given 12/03/21 2239)  ondansetron (ZOFRAN) injection 4 mg (has no administration in time range)  DULoxetine (CYMBALTA) DR capsule 30 mg (has no administration in time range)  senna-docusate (Senokot-S) tablet 1 tablet (has no administration in time range)  Cranberry CAPS 400 mg (has no administration in time range)  amantadine (SYMMETREL) capsule 100 mg (has no administration in time range)  baclofen (LIORESAL) tablet 20 mg (has no administration in time range)  gabapentin (NEURONTIN) capsule 200 mg (has no administration in time range)  gabapentin (NEURONTIN) tablet 800 mg (has no administration in time range)  cholecalciferol (VITAMIN D3) tablet 1,000 Units (has no administration in time range)  acetaminophen (TYLENOL) tablet 650 mg (has no administration in time range)    Or  acetaminophen (TYLENOL) suppository 650 mg (has no administration in time range)  ondansetron (ZOFRAN) tablet 4 mg (has no administration in time range)    Or  ondansetron (ZOFRAN) injection 4 mg (has no administration in time range)  cefTRIAXone (ROCEPHIN) 2 g in sodium chloride 0.9 % 100 mL IVPB (has no administration in time range)  azithromycin (ZITHROMAX) 500 mg in sodium chloride 0.9 % 250 mL IVPB (has no administration in time range)  enoxaparin (LOVENOX) injection 40 mg (has no administration in time range)  lactated  ringers bolus 500 mL (has no administration in time range)  cefTRIAXone (ROCEPHIN) 1 g in sodium chloride 0.9 % 100 mL IVPB (has no administration in time range)  iohexol (OMNIPAQUE) 350 MG/ML injection 80 mL (80 mLs Intravenous Contrast Given 12/03/21 2047)  lactated ringers bolus 1,000 mL (1,000 mLs Intravenous New Bag/Given 12/03/21 2205)  cefTRIAXone (ROCEPHIN) 1 g in sodium chloride 0.9 % 100 mL IVPB (0 g Intravenous Stopped 12/03/21 2222)     IMPRESSION / MDM / ASSESSMENT AND PLAN / ED COURSE  I reviewed the triage vital signs and the nursing notes.                              Differential diagnosis includes, but is not limited to pneumonia, bronchitis, ACS, PE, pericarditis, myocarditis, metabolic derangements and anemia.  ECGs remarkable symptom ventricular rate of  116, normal axis, with very minimal artifact throughout without evidence of clear acute ischemia.  Given nonelevated troponin and a low suspicion for ACS at this time.  Chest X-ray interpreted myself bilateral lower lobe atelectasis possibly early developing elevation in the right lower lobe without evidence of other acute thoracic process such as pneumonia, effusion or pneumothorax.  I also viewed radiology interpretation and agree with the findings of same.  CTA chest obtained due to elevated D-dimer  on my interpretation without evidence of acute, large effusion, pneumothorax but does show left lower lobe pneumonia.  Also viewed radiology interpretation and agree with the findings of same as left lower lobe pneumonia versus aspiration and fatty liver.   BMP shows no significant electrolyte or metabolic derangements.  CBC shows WBC count of 12.1 without evidence of acute anemia and normal platelets.  COVID influenza PCR is negative.  Lactic acid is 1.  Patient initially has to turn criteria on arrival with tachycardia and white blood cell count above 12,000.  Given concern for pneumonia on CT imaging as source of her  infection we will treat his sepsis and obtain blood cultures rectum IV antibiotics.  We will also give some IV fluids admitted to medicine service for further evaluation and treatment.  I discussed this with admitting hospitalist will place admitting orders.      FINAL CLINICAL IMPRESSION(S) / ED DIAGNOSES   Final diagnoses:  Community acquired pneumonia of left lower lobe of lung  Fatty liver  Positive D dimer  Sepsis, due to unspecified organism, unspecified whether acute organ dysfunction present [facility])     Rx / DC Orders   ED Discharge Orders     None        Note:  This document was prepared using Dragon voice recognition software and may include unintentional dictation errors.   Gilles Chiquito, MD 12/03/21 2252

## 2021-12-03 NOTE — Assessment & Plan Note (Addendum)
-   She is status post azithromycin 500 mg IV, ceftriaxone 1 g IV ?- Continue with ceftriaxone 2 g IV, azithromycin 500 mg IV ?- Incentive spirometry, flutter valve ?- Sodium chloride 100 mL/h, 10 hours ordered ?

## 2021-12-03 NOTE — Discharge Instructions (Addendum)

## 2021-12-03 NOTE — Sepsis Progress Note (Signed)
Elink following Code Sepsis. 

## 2021-12-03 NOTE — ED Notes (Signed)
First set of blood cultures drawn from right forearm ?

## 2021-12-03 NOTE — Assessment & Plan Note (Signed)
-   Patient has baseline bedbound state and requires almost complete assistance with ADLs ?- Her 52 year old mother and home health aide are currently sick, it will be tomorrow before anyone can be at home and available to help her ?- TOC consulted for possible placement if needed ?

## 2021-12-03 NOTE — ED Notes (Signed)
Delay in getting patient transferred due to admission provider at bedside to evaluate pt ?

## 2021-12-03 NOTE — Progress Notes (Signed)
CODE SEPSIS - PHARMACY COMMUNICATION ? ?**Broad Spectrum Antibiotics should be administered within 1 hour of Sepsis diagnosis** ? ?Time Code Sepsis Called/Page Received: 2231 ? ?Antibiotics Ordered: Azithromycin & Ceftriaxone ? ?Time of 1st antibiotic administration: 2152 ? ?Otelia Sergeant, PharmD, MBA ?12/03/2021 ?10:39 PM ? ?

## 2021-12-03 NOTE — ED Notes (Signed)
After multiple sticks by several staff, the second "set" of blood cultures was just a yellow bottle ?

## 2021-12-03 NOTE — ED Notes (Signed)
With the patient's permission, this nurse called and updated her sister. The plan will be to call her with the results of the CTA chest and the updated plan of care and then she will call her mother, patient's primary care giver, and explain everything to her in a way that she will understand. Patient lives and is cared for her by her mother but her sister assists in that care. ? ?Patient was taken to CT via stretcher. ?

## 2021-12-03 NOTE — ED Notes (Signed)
Patient repositioned for comfort. RR even and unlabored. Patient verbalizes no other needs or complaints.  ?

## 2021-12-03 NOTE — ED Triage Notes (Signed)
Per EMS, pt from home- lives with family, c/o nasal congestion, SOB and productive cough x1 wk that worsened today. Pt bed-bound w/ chronic MS. Pt reports she had covid booster shot February 8th, pt states she didn't get sick until she had injection. Pt 98% on room air. Pt AOX4, NAD noted.  ?

## 2021-12-03 NOTE — ED Notes (Signed)
Staff still attempting a second INT due to medications that the provider states he will be ordering. Patient understands the need for the second INT and is cooperative. RR even and unlabored. Patient provided with beverage but has no other need or complaints.  ?

## 2021-12-03 NOTE — ED Notes (Signed)
Patient transported to X-ray 

## 2021-12-03 NOTE — ED Notes (Signed)
Patient was resting comfortably on stretcher with eyes closed. RR even and unlabored. Patient verbalized no needs or complaints at this time.  ?

## 2021-12-03 NOTE — Progress Notes (Signed)
PHARMACIST - PHYSICIAN ORDER COMMUNICATION ? ?CONCERNING: P&T Medication Policy on Herbal Medications ? ?DESCRIPTION:  This patient?s order for:  Cranberry CAPS 400 mg  has been noted. ? ?This product(s) is classified as an ?herbal? or natural product. ?Due to a lack of definitive safety studies or FDA approval, nonstandard manufacturing practices, plus the potential risk of unknown drug-drug interactions while on inpatient medications, the Pharmacy and Therapeutics Committee does not permit the use of ?herbal? or natural products of this type within [facility]. ?  ?ACTION TAKEN: ?The pharmacy department is unable to verify this order at this time.  Please reevaluate patient?s clinical condition at discharge and address if the herbal or natural product(s) should be resumed at that time.  ? ?Otelia Sergeant, PharmD, MBA ?12/03/2021 ?11:24 PM ? ?

## 2021-12-03 NOTE — Hospital Course (Signed)
Ms. [name] is a 51 year old female with multiple sclerosis, bedbound state, presents emergency department from home via EMS for chief concerns of shortness of breath, productive cough. ? ?Initial vitals in the emergency department showed temperature of 98.3, respiration rate of 20, heart rate 93, blood pressure 115/89, SPO2 of 98% on room air. ? ?Serum sodium 137, potassium 3.9, chloride 101, bicarb 27, BUN of 13, serum creatinine of 0.62, nonfasting blood glucose 80, GFR greater than 60, high sensitive troponin was 5.  Lactic acid was 1.0. ? ?WBC 12.1, hemoglobin 13.2, platelets 279. ? ?COVID/influenza A/influenza B PCR were negative. ? ?D-dimer was elevated at 1.01. ? ?CT of the chest for PE was read as no evidence of central pulmonary artery embolus.  Left lower lobe pneumonia versus aspiration.  Fatty liver. ? ?ED treatment: Ceftriaxone 1 g IV, azithromycin 500 mg IV, LR 1 L bolus. ?

## 2021-12-03 NOTE — ED Notes (Signed)
Patient called out stating she felt like she was going to vomit. Emesis bag was provided and verbal order received from provider.  ?

## 2021-12-03 NOTE — Assessment & Plan Note (Signed)
-   Chronic Foley catheter in place ?

## 2021-12-03 NOTE — ED Notes (Signed)
Spoke to lab tech regarding the hold tubes and the outstanding HIV test. She stated that it will be drawn in the morning because they only had one of the two tubes necessary to run the test. ?

## 2021-12-03 NOTE — Assessment & Plan Note (Signed)
-   Mucinex twice daily starting on 12/04/2021 ?- Tussionex nightly as needed for cough ordered ?

## 2021-12-03 NOTE — ED Notes (Signed)
Provider has been made aware that patient has no IV access. He states that we will hold off until the results of her D-Dimer come back. ?

## 2021-12-03 NOTE — ED Notes (Signed)
Patient resting comfortably on stretcher in room. RR even and unlabored but patient continues to complain about ongoing cough. Lab tech is at bedside attempting to obtain blood specimens per orders after day shift nurses were unable to obtain IV access. Patient has no other needs or complaints at this time. Call bell is at bedside. ?

## 2021-12-03 NOTE — ED Notes (Signed)
Provider notified this nurse that D-Dimer is elevated and she will need a CTA chest. This nurse notified charge nurse for US guided INT ?

## 2021-12-03 NOTE — H&P (Signed)
History and Physical   [name] [id number] DOB: [birth year] DOA: 12/03/2021  PCP: Hoyt Koch, MD  Patient coming from: home via EMS  I have personally briefly reviewed patient's old medical records in [place].  Chief Concern: Cough and shortness of breath  HPI: Ms. [name] is a 51 year old female with multiple sclerosis, bedbound state, presents emergency department from home via EMS for chief concerns of shortness of breath, productive cough.  Initial vitals in the emergency department showed temperature of 98.3, respiration rate of 20, heart rate 93, blood pressure 115/89, SPO2 of 98% on room air.  Serum sodium 137, potassium 3.9, chloride 101, bicarb 27, BUN of 13, serum creatinine of 0.62, nonfasting blood glucose 80, GFR greater than 60, high sensitive troponin was 5.  Lactic acid was 1.0.  WBC 12.1, hemoglobin 13.2, platelets 279.  COVID/influenza A/influenza B PCR were negative.  D-dimer was elevated at 1.01.  CT of the chest for PE was read as no evidence of central pulmonary artery embolus.  Left lower lobe pneumonia versus aspiration.  Fatty liver.  ED treatment: Ceftriaxone 1 g IV, azithromycin 500 mg IV, LR 1 L bolus.  At bedside patient is able to tell me her name, her age, she knows the current year and she knows she is in the hospital.  She reports she has been having increased cough and shortness of breath for the last several days.  She also endorses that her home health aide has been recently sick as well and is currently out.  She reports that she has been having fever, with Tmax today of 100.5 at home.  She states the fever resolved after taking Tylenol.  She also endorses intractable nausea that is minimally relieved with the IV nausea medication given in the ED.    She states the cough has been productive of dark yellow sputum.  She denies chest pain, abdominal pain, vomiting. She reports that the cough has been preventing her  from getting adequate and appropriate sleep.  Social history: Is at home with her mother.  She denies tobacco, EtOH, recreational drug use.  Vaccination history: She states she is vaccinated for COVID-19 and influenza.  ROS: Constitutional: no weight change, no fever ENT/Mouth: no sore throat, no rhinorrhea Eyes: no eye pain, no vision changes Cardiovascular: no chest pain, no dyspnea,  no edema, no palpitations Respiratory: no cough, no sputum, no wheezing Gastrointestinal: no nausea, no vomiting, no diarrhea, no constipation Genitourinary: no urinary incontinence, no dysuria, no hematuria Musculoskeletal: no arthralgias, no myalgias Skin: no skin lesions, no pruritus, Neuro: + weakness, no loss of consciousness, no syncope Psych: no anxiety, no depression, + decrease appetite Heme/Lymph: no bruising, no bleeding  ED Course: Discussed with emergency medicine provider, patient requiring hospitalization for chief concerns of committee acquired pneumonia and no available caregiver.  Assessment/Plan  Principal Problem:   Community acquired pneumonia Active Problems:   Upper extremity weakness   Multiple sclerosis, primary progressive (HCC)   Neurogenic bowel   Bladder neurogenesis   Multiple sclerosis (HCC)   Cough   Difficult intravenous access   Intractable nausea and vomiting    Respiratory * Community acquired pneumonia Assessment & Plan - She is status post azithromycin 500 mg IV, ceftriaxone 1 g IV - Continue with ceftriaxone 2 g IV, azithromycin 500 mg IV - Incentive spirometry, flutter valve - Sodium chloride 100 mL/h, 10 hours ordered  Digestive Intractable nausea and vomiting Assessment & Plan - Symptomatic support  with as needed ondansetron - As needed Reglan for intractable nausea and vomiting ordered  Nervous and Auditory Multiple sclerosis ([facility]) Assessment & Plan - Patient has baseline bedbound state and requires almost complete assistance with ADLs -  Her 24 year old mother and home health aide are currently sick, it will be tomorrow before anyone can be at home and available to help her - [facility] consulted for possible placement if needed  Other Difficult intravenous access Assessment & Plan - Currently has a 24-gauge in left upper extremity, Left and a 20-gauge in the right upper extremity; both required ultrasounds and multiple sticks   Cough Assessment & Plan - Mucinex twice daily starting on 12/04/2021 - Tussionex nightly as needed for cough ordered  Bladder neurogenesis Assessment & Plan - Chronic Foley catheter in place  Chart reviewed.   DVT prophylaxis: Enoxaparin Code Status: Full code Diet: Regular diet Family Communication: No Disposition Plan: Pending clinical course, anticipate discharge on 12/04/2021 Consults called: None at this time Admission status: MedSurg, observation, no telemetry  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   Edema leg 09/05/2014   History of construction of external stoma of urinary system 04/29/2015   MS (multiple sclerosis) ([place]) 2005   Multiple sclerosis, primary progressive ([place]) 01/09/2013   Neurogenic bowel 08/12/2012   Neuropathic pain 05/21/2015   Past Surgical History:  Procedure Laterality Date   ABDOMINAL HYSTERECTOMY  2001   BACK SURGERY  2004   REVISION UROSTOMY CUTANEOUS  06/2012   Social History:  reports that she has never smoked. She has never used smokeless tobacco. She reports current alcohol use. She reports that she does not use drugs.  Allergies  Allergen Reactions   Latex Swelling and Other (See Comments)    Reaction:  Facial swelling    Family History  Problem Relation Age of Onset   Osteoarthritis Mother    Hypertension Mother    Hyperlipidemia Mother    Thyroid disease Mother    Liver cancer Father    Asthma Brother    Family history: Family history reviewed and not pertinent  Prior to  Admission medications   Medication Sig Start Date End Date Taking? Authorizing Provider  acetaminophen (TYLENOL) 500 MG tablet Take 500 mg by mouth every 6 (six) hours as needed.   Yes [provider]  amantadine (SYMMETREL) 100 MG capsule Take 100 mg by mouth 2 (two) times daily.   Yes [provider]  baclofen (LIORESAL) 20 MG tablet Take 20 mg by mouth 2 (two) times daily.    Yes [provider]  cholecalciferol (VITAMIN D) 1000 units tablet Take 1,000 Units by mouth daily.   Yes [provider]  Cranberry 400 MG CAPS Take 400 mg by mouth daily.   Yes [provider]  DULoxetine (CYMBALTA) 30 MG capsule Take 30 mg by mouth 2 (two) times daily.   Yes [provider]  gabapentin (NEURONTIN) 100 MG capsule Take 200 mg by mouth every morning. 06/17/21  Yes [provider]  gabapentin (NEURONTIN) 800 MG tablet Take 800 mg by mouth at bedtime. 07/08/21  Yes [provider]  OZEMPIC, 1 MG/DOSE, 4 MG/3ML SOPN Inject 1 mg into the skin once a week. On Monday 09/02/21  Yes [provider]  STIMULANT LAXATIVE 8.6-50 MG tablet Take 1 tablet by mouth at bedtime. 11/07/21  Yes [provider]   Physical Exam: Vitals:   12/03/21 1845 12/03/21 1930 12/03/21  [id number] CMemphis Va Medical CentermpanileDominican HospiPaulina Ta a ileUniversity Of Md Medical [facility] rline LopesC-Road [id number] Tax adviserPaulina FusiDiscover Vision Surgery And Laser Center LLCAndrey CampanileNaval Hospital Camp Pendleton Berline LopesWhittier [id number] Tax adviserPaulina FusiLegacy Surgery CenterAndrey CampanileWhite Flint Surgery LLC Ber[place]opesRockville [id number] Tax adviserPaulina FusiAdventhealth Surgery Center Wellswood LLCAndrey CampanileCrown Valley Outpatient Surgical Center LLC  1859  WBC 12.1*  NEUTROABS 7.6  HGB 13.2  HCT 41.9  MCV 92.1  PLT [id number]   Basic Metabolic Panel: Recent Labs  Lab 12/03/21 1859  NA 137  K 3.9  CL 101  CO2 27  GLUCOSE 80  BUN 13  CREATININE 0.62  CALCIUM 8.9   Urine analysis:    Component Value Date/Time   COLORURINE YELLOW 09/02/2021 1745   APPEARANCEUR HAZY (A) 09/02/2021 1745   APPEARANCEUR Cloudy 10/05/2014 2100   LABSPEC 1.015 09/02/2021 1745   LABSPEC 1.009 10/05/2014 2100   PHURINE 6.5 09/02/2021 1745   GLUCOSEU NEGATIVE 09/02/2021 1745   GLUCOSEU Negative 10/05/2014 2100   HGBUR MODERATE (A) 09/02/2021 1745   BILIRUBINUR NEGATIVE 09/02/2021 1745    BILIRUBINUR Negative 10/05/2014 2100   KETONESUR 80 (A) 09/02/2021 1745   PROTEINUR TRACE (A) 09/02/2021 1745   NITRITE POSITIVE (A) 09/02/2021 1745   LEUKOCYTESUR LARGE (A) 09/02/2021 1745   LEUKOCYTESUR 3+ 10/05/2014 2100   Dr. Tobie Poet [facility]  If 7PM-7AM, please contact overnight-coverage provider If 7AM-7PM, please contact day coverage provider [email or web address]  12/04/2021, 12:06 AM

## 2021-12-03 NOTE — Assessment & Plan Note (Signed)
-   Currently has a 24-gauge in left upper extremity, Left and a 20-gauge in the right upper extremity; both required ultrasounds and multiple sticks ? ?

## 2021-12-04 DIAGNOSIS — G35 Multiple sclerosis: Secondary | ICD-10-CM | POA: Diagnosis present

## 2021-12-04 DIAGNOSIS — R112 Nausea with vomiting, unspecified: Secondary | ICD-10-CM | POA: Diagnosis present

## 2021-12-04 DIAGNOSIS — J189 Pneumonia, unspecified organism: Secondary | ICD-10-CM | POA: Diagnosis not present

## 2021-12-04 DIAGNOSIS — Z23 Encounter for immunization: Secondary | ICD-10-CM | POA: Diagnosis present

## 2021-12-04 LAB — BASIC METABOLIC PANEL
Anion gap: 9 (ref 5–15)
BUN: 9 mg/dL (ref 6–20)
CO2: 27 mmol/L (ref 22–32)
Calcium: 8.7 mg/dL — ABNORMAL LOW (ref 8.9–10.3)
Chloride: 101 mmol/L (ref 98–111)
Creatinine, Ser: 0.57 mg/dL (ref 0.44–1.00)
GFR, Estimated: >60 mL/min (ref >60)
Glucose, Bld: 80 mg/dL (ref 70–99)
Potassium: 3.6 mmol/L (ref 3.5–5.1)
Sodium: 137 mmol/L (ref 135–145)

## 2021-12-04 LAB — CBC
HCT: 39 % (ref 36.0–46.0)
Hemoglobin: 12.5 g/dL (ref 12.0–15.0)
MCH: 29.1 pg (ref 26.0–34.0)
MCHC: 32.1 g/dL (ref 30.0–36.0)
MCV: 90.9 fL (ref 80.0–100.0)
Platelets: 268 10*3/uL (ref 150–400)
RBC: 4.29 MIL/uL (ref 3.87–5.11)
RDW: 14.3 % (ref 11.5–15.5)
WBC: 9.6 10*3/uL (ref 4.0–10.5)
nRBC: 0 % (ref 0.0–0.2)

## 2021-12-04 LAB — GLUCOSE, CAPILLARY
Glucose-Capillary: 100 mg/dL — ABNORMAL HIGH (ref 70–99)
Glucose-Capillary: 106 mg/dL — ABNORMAL HIGH (ref 70–99)

## 2021-12-04 LAB — HIV ANTIBODY (ROUTINE TESTING W REFLEX): HIV Screen 4th Generation wRfx: NONREACTIVE

## 2021-12-04 LAB — PROCALCITONIN: Procalcitonin: 8.52 ng/mL

## 2021-12-04 MED ORDER — METOCLOPRAMIDE HCL 5 MG/ML IJ SOLN: 5.0000 mg | Freq: Four times a day (QID) | INTRAMUSCULAR | Status: DC | PRN

## 2021-12-04 MED ORDER — ALBUTEROL SULFATE (2.5 MG/3ML) 0.083% IN NEBU: 2.5000 mg | INHALATION_SOLUTION | RESPIRATORY_TRACT | Status: DC | PRN

## 2021-12-04 MED ORDER — GUAIFENESIN-DM 100-10 MG/5ML PO SYRP
5.0000 mL | ORAL_SOLUTION | ORAL | Status: DC | PRN
  Administered 2021-12-04 – 2021-12-06 (×4): 5 mL via ORAL
  Filled 2021-12-04 (×4): qty 5

## 2021-12-04 MED ORDER — PNEUMOCOCCAL 20-VAL CONJ VACC 0.5 ML IM SUSY
0.5000 mL | PREFILLED_SYRINGE | INTRAMUSCULAR | Status: AC
Start: 2021-12-05 — End: 2021-12-05
  Administered 2021-12-05: 0.5 mL via INTRAMUSCULAR
  Filled 2021-12-04 (×2): qty 0.5

## 2021-12-04 MED ORDER — AZITHROMYCIN 500 MG PO TABS
500.0000 mg | ORAL_TABLET | Freq: Every day | ORAL | Status: DC
  Administered 2021-12-04 – 2021-12-05 (×2): 500 mg via ORAL
  Filled 2021-12-04 (×2): qty 1

## 2021-12-04 MED ORDER — PHENOL 1.4 % MT LIQD
1.0000 | OROMUCOSAL | Status: DC | PRN
  Filled 2021-12-04: qty 177

## 2021-12-04 MED ORDER — LOPERAMIDE HCL 2 MG PO CAPS
4.0000 mg | ORAL_CAPSULE | Freq: Once | ORAL | Status: AC
  Administered 2021-12-04: 4 mg via ORAL
  Filled 2021-12-04: qty 2

## 2021-12-04 NOTE — Evaluation (Signed)
Physical Therapy Evaluation ?Patient Details ?Name: [name] ?MRN: [id number] ?DOB: Jul 05, 1971 ?Today's Date: 12/04/2021 ? ?History of Present Illness ? Patient is a 51 year old female who reports to [facility] for community aquired pneumonia. PMH(+)for multiple sclerosis, neurogenic bladder, and urostomy. Patient is bedbound at baseline. ?  ?Clinical Impression ? Physical Therapy Evaluation completed on this date. Patient tolerated session well and was pleasant and agreeable to treatment. Upon entry patient was supine in bed with RN an NT present. At baseline, patient is total assist with all gross functional mobility, and utilizes a power wheelchair. She reports she has an aid at comes to the house ~5 hours M-Sat, and ~3 hours on Sunday to help with all ADLs. Patient is able to however feed herself and perform facial hygiene such as washing her face. Physical examination revealed generalized weakness in BUEs (L>R) at at least 3+/5 strength. BLEs demonstrate 0/5 strength with clonus among passive DF bilaterally. Patient utilizes a Nurse, adult at home to transfer from her bed to her wheelchair. Patient is demonstrating her baseline level of function, and does not require additional skilled physical therapy during her acute hospitalization. Might recommend HHPT upon discharge for BUE weakness to decrease caregiver burden. Signing off.  ?   ? ?Recommendations for follow up therapy are one component of a multi-disciplinary discharge planning process, led by the attending physician.  Recommendations may be updated based on patient status, additional functional criteria and insurance authorization. ? ?Follow Up Recommendations Home health PT ? ?  ?Assistance Recommended at Discharge Intermittent Supervision/Assistance  ?Patient can return home with the following ? A lot of help with bathing/dressing/bathroom;Two people to help with walking and/or transfers;Help with stairs or ramp for entrance;Assistance with  cooking/housework;Two people to help with bathing/dressing/bathroom ? ?  ?Equipment Recommendations None recommended by PT  ?Recommendations for Other Services ?    ?  ?Functional Status Assessment Patient has not had a recent decline in their functional status  ? ?  ?Precautions / Restrictions Precautions ?Precautions: Fall ?Restrictions ?Weight Bearing Restrictions: No  ? ?  ? ?Mobility ? Bed Mobility ?Overal bed mobility:  (deferred as patient is total assist for bed mobility at baseline) ?  ?  ?  ?  ?  ?  ?  ?  ? ?Transfers ?  ?  ?  ?  ?  ?  ?  ?  ?  ?  ?  ? ?Ambulation/Gait ?  ?  ?  ?  ?  ?  ?  ?  ? ?Stairs ?  ?  ?  ?  ?  ? ?Wheelchair Mobility ?  ? ?Modified Rankin (Stroke Patients Only) ?  ? ?  ? ?Balance   ?  ?  ?  ?  ?  ?  ?  ?  ?  ?  ?  ?  ?  ?  ?  ?  ?  ?  ?   ? ? ? ?Pertinent Vitals/Pain Pain Assessment ?Pain Assessment: No/denies pain  ? ? ?Home Living Family/patient expects to be discharged to:: Private residence ?Living Arrangements: Parent ?Available Help at Discharge: Family;Other (Comment) (Aide (M-Sat for ~5 hours, and Sunday Morning ~3 hours)) ?Type of Home: House ?Home Access: Ramped entrance ?  ?  ?  ?Home Layout: One level ?Home Equipment: Wheelchair - power;Hospital bed;Wheelchair - manual;Other (comment) Michiel Sites lift) ?Additional Comments: Still has manual wheelchair, however no longer uses it  ?  ?Prior Function Prior Level of Function : Needs assist ?  ?  ?  ?  Physical Assist : Mobility (physical);ADLs (physical) ?Mobility (physical): Bed mobility;Transfers ?ADLs (physical): Bathing;Grooming;Feeding;Toileting;Dressing ?Mobility Comments: Total assist- has been bedbound, non-ambulatory, power wheelchair at baseline ?ADLs Comments: Able to feed herself, wash face by herself, however aide helps with all other ADLs ?  ? ? ?Hand Dominance  ? Dominant Hand: Right ? ?  ?Extremity/Trunk Assessment  ? Upper Extremity Assessment ?Upper Extremity Assessment: Generalized weakness (3+/5 bilaterally,  impaired grip strength L>R) ?  ? ?Lower Extremity Assessment ?Lower Extremity Assessment: Generalized weakness (0/5 bilaterally, clonus present bilaterally with passive DF, impaired sensation to light touch bilaterally) ?  ? ?   ?Communication  ? Communication: No difficulties  ?Cognition Arousal/Alertness: Awake/alert ?Behavior During Therapy: [facility] for tasks assessed/performed ?Overall Cognitive Status: Within Functional Limits for tasks assessed ?  ?  ?  ?  ?  ?  ?  ?  ?  ?  ?  ?  ?  ?  ?  ?  ?General Comments: A&Ox3- self, location, sitaution ?  ?  ? ?  ?General Comments   ? ?  ?Exercises    ? ?Assessment/Plan  ?  ?PT Assessment Patient needs continued PT services  ?PT Problem List Decreased strength ? ?   ?  ?PT Treatment Interventions     ? ?PT Goals (Current goals can be found in the Care Plan section)  ?Acute Rehab PT Goals ?Patient Stated Goal: to go home ?PT Goal Formulation: With patient ?Time For Goal Achievement: 12/18/21 ?Potential to Achieve Goals: Good ? ?  ?Frequency   ?  ? ? ?Co-evaluation   ?  ?  ?  ?  ? ? ?  ?AM-PAC PT "6 Clicks" Mobility  ?Outcome Measure Help needed turning from your back to your side while in a flat bed without using bedrails?: Total ?Help needed moving from lying on your back to sitting on the side of a flat bed without using bedrails?: Total ?Help needed moving to and from a bed to a chair (including a wheelchair)?: Total ?Help needed standing up from a chair using your arms (e.g., wheelchair or bedside chair)?: Total ?Help needed to walk in hospital room?: Total ?Help needed climbing 3-5 steps with a railing? : Total ?6 Click Score: 6 ? ?  ?End of Session   ?Activity Tolerance: Other (comment);Patient tolerated treatment well (Limited due to patient's dx) ?Patient left: in bed;with call bell/phone within reach;with bed alarm set ?Nurse Communication: Mobility status ?PT Visit Diagnosis: Muscle weakness (generalized) (M62.81) ?  ? ?Time: 5631-4970 ?PT Time Calculation (min)  (ACUTE ONLY): 13 min ? ? ?Charges:   PT Evaluation ?$PT Eval Moderate Complexity: 1 Mod ?  ?  ?   ? ? ?Angelica Ran, PT  ?12/04/21. 12:29 PM ? ? ?

## 2021-12-04 NOTE — Assessment & Plan Note (Signed)
-   Symptomatic support with as needed ondansetron ?- As needed Reglan for intractable nausea and vomiting ordered ?

## 2021-12-04 NOTE — Progress Notes (Signed)
PHARMACIST - PHYSICIAN COMMUNICATION ? ?CONCERNING: Antibiotic IV to Oral Route Change Policy ? ?RECOMMENDATION: ?This patient is receiving azithromycin by the intravenous route.  Based on criteria approved by the Pharmacy and Therapeutics Committee, the antibiotic(s) is/are being converted to the equivalent oral dose form(s). ? ? ?DESCRIPTION: ?These criteria include: ?Patient being treated for a respiratory tract infection, urinary tract infection, cellulitis or clostridium difficile associated diarrhea if on metronidazole ?The patient is not neutropenic and does not exhibit a GI malabsorption state ?The patient is eating (either orally or via tube) and/or has been taking other orally administered medications for a least 24 hours ?The patient is improving clinically and has a Tmax < 100.5 ? ?If you have questions about this conversion, please contact the Pharmacy Department  ? ? B   ?12/04/21  ?  ?

## 2021-12-04 NOTE — Progress Notes (Signed)
PHARMACIST - PHYSICIAN COMMUNICATION ? ?CONCERNING:  Enoxaparin (Lovenox) for DVT Prophylaxis  ? ? ?RECOMMENDATION: ?Patient was prescribed enoxaprin 40mg  q24 hours for VTE prophylaxis.  ? ?Filed Weights  ? 12/04/21 0127  ?Weight: 99.3 kg (219 lb)  ? ? ?Body mass index is 37.59 kg/m?. ? ?Estimated Creatinine Clearance: 94.1 mL/min (by C-G formula based on SCr of 0.62 mg/dL). ? ? ?Based on [facility] policy patient is candidate for enoxaparin 0.5mg /kg TBW SQ every 24 hours based on BMI being >30. ? ?DESCRIPTION: ?Pharmacy has adjusted enoxaparin dose per [facility] policy. ? ?Patient is now receiving enoxaparin 0.5 mg/kg every 24 hours  ? ? ?[facility], PharmD, MBA ?12/04/2021 ?1:40 AM ? ?

## 2021-12-04 NOTE — Assessment & Plan Note (Addendum)
She denies having nausea and vomiting prior to hospital arrival. ?Reports an episode of nausea and vomiting after receiving IV antibiotics. ?Resolved and tolerating diet. ?Had mild self-limiting diarrhea, resolved after a dose of Imodium. ?

## 2021-12-04 NOTE — Progress Notes (Addendum)
PROGRESS NOTE   [name]  [name]:[id number]    DOB: 1970/11/30    DOA: 12/03/2021  PCP: [name], MD   I have briefly reviewed patients previous medical records in [facility].  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  CTA chest negative for pulmonary embolism but confirmed left lower lobe pneumonia.  Admitted for lobar pneumonia.  Improving.   Assessment & Plan:  Assessment and Plan: * Community acquired pneumonia CTA chest negative for pulmonary embolism but confirmed left lower lobe pneumonia Patient's caregiver recently sick with respiratory symptoms after a flu shot. Continue empirically started IV ceftriaxone and azithromycin. Given history of some coughing while oral intake, had ST evaluated who recommended regular diet and thin liquids. Poor cough due to MS, aggressive pulmonary toilet/chest PT. Recommend follow-up chest x-ray in 4 weeks to ensure resolution of pneumonia findings.  Nausea and vomiting She denies having nausea and vomiting prior to hospital arrival. Reports an episode of nausea and vomiting after receiving IV antibiotics. Currently asymptomatic and tolerating diet.  Multiple sclerosis (HCC) At home, essentially bedbound. Family and caregivers assist her to chair using a Hoyer lift. Has neurogenic bladder likely related to MS and has a chronic Urostomy. PT evaluation appreciated, at baseline patient is a total assist with all gross functional mobility and utilizes up power wheelchair.  She has aide coming home.  Patient able to feed herself and perform facial hygiene.  At baseline  functioning and recommended home health PT. Continue PTA meds including amantadine, baclofen and Neurontin.    Body mass index is 37.59 kg/m.   DVT prophylaxis: Place TED hose Start: 12/03/21 2248     Code Status: Full Code:  Family Communication: None at bedside Disposition:  Status is: Inpatient Remains inpatient appropriate because: IV antibiotics    Consultants:   None  Procedures:   None  Antimicrobials:   IV ceftriaxone and azithromycin   Subjective:  Cough is productive at home but nonproductive here.  Feels like she has chest congestion but unable to bring it up.  Denies nausea or vomiting prior to hospital arrival and had 1 episode of vomiting following IV antibiotics.  Currently without nausea.  Tolerating diet.  Last BM yesterday at home.  No other complaints reported.  Overall feels improved.  Reports that she has a small wound on her bottom, WOC RN consulted.  Objective:   Vitals:   12/04/21 0230 12/04/21 0628 12/04/21 0800 12/04/21 1144  BP: (!) 136/59 140/84 136/67 136/73  Pulse: 97 (!) 101 94 96  Resp: Temp: 98.1 F (36.7 C) 98.4 F (36.9 C) 97.9 F (36.6 C) 97.9 F (36.6 C)  TempSrc: Oral     SpO2: 98% 97% 99% 96%  Weight:      Height:        General exam: Young female, moderately built and obese lying comfortably propped up in bed. Respiratory system: Slightly diminished breath sounds in the bases with occasional basal crackles.  Rest of lung fields clear to auscultation.  No increased work of breathing.  Weak cough noted. Cardiovascular system: S1 & S2 heard, RRR. No JVD, murmurs, rubs, gallops or clicks.  No pedal edema.  Not on telemetry. Gastrointestinal system: Abdomen is nondistended, soft and nontender. No organomegaly or masses felt. Normal bowel sounds heard. Central nervous system: Alert and oriented. No focal neurological deficits. Extremities: Symmetric grade 4 x 4 power in upper extremities and 0 x 5 power in lower  extremities.  Bilateral legs with compression stockings. Skin: No rashes, lesions or ulcers Psychiatry: Judgement and insight appear normal. Mood & affect appropriate.     Data Reviewed:   I have personally reviewed following labs and imaging studies   CBC: Recent Labs  Lab 12/03/21 1859 12/04/21 0648  WBC 12.1* 9.6  NEUTROABS 7.6  --   HGB 13.2 12.5  HCT 41.9 39.0  MCV 92.1 90.9  PLT 279 268    Basic Metabolic Panel: Recent Labs  Lab 12/03/21 1859 12/04/21 0648  NA 137 137  K 3.9 3.6  CL 101 101  CO2 27 27  GLUCOSE 80 80  BUN 13 9  CREATININE 0.62 0.57  CALCIUM 8.9 8.7*    Liver Function Tests: No results for input(s): AST, ALT, ALKPHOS, BILITOT, PROT, ALBUMIN in the last 168 hours.  CBG: Recent Labs  Lab 12/04/21 1035  GLUCAP 100*    Microbiology Studies:   Recent Results (from the past 240 hour(s))  Resp Panel by RT-PCR (Flu A&B, Covid) Nasopharyngeal Swab     Status: None   Collection Time: 12/03/21  6:16 PM   Specimen: Nasopharyngeal Swab; Nasopharyngeal(NP) swabs in vial transport medium  Result Value Ref Range Status   SARS Coronavirus 2 by RT PCR NEGATIVE NEGATIVE Final    Comment: (NOTE) SARS-CoV-2 target nucleic acids are NOT DETECTED.  The SARS-CoV-2 RNA is generally detectable in upper respiratory specimens during the acute phase of infection. The lowest concentration of SARS-CoV-2 viral copies this assay can detect is 138 copies/mL. A negative result does not preclude SARS-Cov-2 infection and should not be used as the sole basis for treatment or other patient management decisions. A negative result may occur with  improper specimen collection/handling, submission of specimen other than nasopharyngeal swab, presence of viral mutation(s) within the areas targeted by this assay, and inadequate number of viral copies(<138 copies/mL). A negative result must be combined with clinical observations, patient history, and  epidemiological information. The expected result is Negative.  Fact Sheet for Patients:  BloggerCourse.com  Fact Sheet for Healthcare Providers:  SeriousBroker.it  This test is no t yet approved or cleared by the [place] FDA and  has been authorized for detection and/or diagnosis of SARS-CoV-2 by FDA under an Emergency Use Authorization (EUA). This EUA will remain  in effect (meaning this test can be used) for the duration of the COVID-19 declaration under Section 564(b)(1) of the Act, 21 U.S.C.section 360bbb-3(b)(1), unless the authorization is terminated  or revoked sooner.       Influenza A by PCR NEGATIVE NEGATIVE Final   Influenza B by PCR NEGATIVE NEGATIVE Final    Comment: (NOTE) The Xpert Xpress SARS-CoV-2/FLU/RSV plus assay is intended as an aid in the diagnosis of influenza from Nasopharyngeal swab specimens and should not be used as a sole basis for treatment. Nasal washings and aspirates are unacceptable for Xpert Xpress SARS-CoV-2/FLU/RSV testing.  Fact Sheet for Patients: BloggerCourse.com  Fact Sheet for Healthcare Providers: SeriousBroker.it  This test is not yet approved or cleared by the [place] FDA and has been authorized for detection and/or diagnosis of SARS-CoV-2 by FDA under an Emergency Use Authorization (EUA). This EUA will remain  in effect (meaning this test can be used) for the duration of the COVID-19 declaration under Section 564(b)(1) of the Act, 21 U.S.C. section 360bbb-3(b)(1), unless the authorization is terminated or revoked.  Performed at [facility], [address] [place], [place] [address]   Blood Culture (routine x 2)     Status: None (Preliminary result)   Collection Time: 12/03/21  9:47 PM   Specimen: BLOOD  Result Value Ref Range Status   Specimen Description BLOOD BLOOD RIGHT FOREARM  Final   Special  Requests   Final    BOTTLES DRAWN AEROBIC AND ANAEROBIC Blood Culture adequate volume   Culture   Final    NO GROWTH < 12 HOURS Performed at [facility], [address], [place], [place] [address]    Report Status PENDING  Incomplete  Blood Culture (routine x 2)     Status: None (Preliminary result)   Collection Time: 12/03/21  9:47 PM   Specimen: BLOOD  Result Value Ref Range Status   Specimen Description BLOOD BLOOD LEFT HAND  Final   Special Requests IN PEDIATRIC BOTTLE Blood Culture adequate volume  Final   Culture   Final    NO GROWTH < 12 HOURS Performed at [facility], [address] [place], [place] [address]    Report Status PENDING  Incomplete    Radiology Studies:  DG Chest 2 View  Result Date: 12/03/2021 CLINICAL DATA:  Productive cough. EXAM: CHEST - 2 VIEW COMPARISON:  September 02, 2021. FINDINGS: The heart size and mediastinal contours are within normal limits. Hypoinflation of the lungs with mild bibasilar subsegmental atelectasis is noted. The visualized skeletal structures are unremarkable. IMPRESSION: Hypoinflation of the lungs with mild bibasilar subsegmental atelectasis. Electronically Signed   By: Lupita Raider M.D.   On: 12/03/2021 18:41   CT Angio Chest PE W and/or Wo Contrast  Result Date: 12/03/2021 CLINICAL DATA:  Concern for pulmonary embolism. EXAM: CT ANGIOGRAPHY CHEST WITH CONTRAST TECHNIQUE: Multidetector CT imaging of the chest was performed using the standard protocol during bolus administration of intravenous contrast. Multiplanar CT image reconstructions and MIPs were obtained to evaluate the vascular anatomy. RADIATION DOSE REDUCTION: This exam was performed according to the departmental dose-optimization program which includes automated exposure control, adjustment of the mA and/or kV according to patient size and/or use of iterative reconstruction technique. CONTRAST:  33mL OMNIPAQUE IOHEXOL 350 MG/ML SOLN COMPARISON:  Chest CT  dated 09/03/2021 and radiograph dated 12/03/2021. FINDINGS: Evaluation of this exam is limited due to respiratory motion artifact. Cardiovascular: There is no cardiomegaly or pericardial effusion. The thoracic aorta is unremarkable. The origins of the great vessels of the aortic arch appear patent. Evaluation of the pulmonary arteries is limited due to respiratory motion artifact. No central pulmonary artery embolus identified. Mediastinum/Nodes: No hilar or mediastinal adenopathy. The esophagus and thyroid gland are grossly unremarkable. No mediastinal fluid collection. Lungs/Pleura: Clusters of nodular and ground-glass opacity primarily involving the left lower lobe most consistent with pneumonia. Aspiration is not excluded. Patchy and streaky density in the right lower lobe may represent atelectasis or infiltrate. There is no pleural effusion or pneumothorax. The central airways are patent. Upper Abdomen: Fatty liver.  Renal cortical scarring and infarct. Musculoskeletal: No chest wall abnormality. No acute or significant osseous findings. Review of the MIP images confirms the above findings. IMPRESSION: 1. No CT evidence of central pulmonary artery embolus. 2. Left lower lobe pneumonia versus aspiration. 3. Fatty liver. Electronically Signed   By:  Elgie Collard M.D.   On: 12/03/2021 20:58    Scheduled Meds:    amantadine  100 mg Oral BID   azithromycin  500 mg Oral QHS   baclofen  20 mg Oral BID   cholecalciferol  1,000 Units Oral Daily   DULoxetine  30 mg Oral BID   enoxaparin (LOVENOX) injection  0.5 mg/kg Subcutaneous Q24H   gabapentin  200 mg Oral q morning   gabapentin  800 mg Oral QHS   guaiFENesin  600 mg Oral BID   [START ON 12/05/2021] pneumococcal 20-valent conjugate vaccine  0.5 mL Intramuscular Tomorrow-1000   senna-docusate  1 tablet Oral QHS    Continuous Infusions:    cefTRIAXone (ROCEPHIN)  IV       LOS: 0 days     Marcellus Scott, MD,  FACP, [facility], [facility], [facility] (Care  Management Physician Certified) [facility] [place]  To contact the attending provider between 7A-7P or the covering provider during after hours 7P-7A, please log into the web site [email or web address] and access using universal [place] password for that web site. If you do not have the password, please call the hospital operator.  12/04/2021, 2:47 PM

## 2021-12-04 NOTE — Hospital Course (Addendum)
51 year old female, medical history significant for multiple sclerosis, quadriparesis/paraplegia secondary to same, bedbound status, neurogenic bladder, presented to the ED with complaints of productive cough, dyspnea, low-grade fevers in the context of her caregiver being sick with respiratory symptoms after a flu shot.  CTA chest negative for pulmonary embolism but confirmed left lower lobe pneumonia.  Admitted for lobar pneumonia.  ?

## 2021-12-04 NOTE — Assessment & Plan Note (Addendum)
CTA chest negative for pulmonary embolism but confirmed left lower lobe pneumonia ?Patient's caregiver recently sick with respiratory symptoms after a flu shot. ?Continue empirically started IV ceftriaxone and azithromycin, day 3. ?Given history of some coughing while oral intake, had ST evaluated who recommended regular diet and thin liquids. ?Poor cough due to MS, aggressive pulmonary toilet/chest PT. ?Recommend follow-up imaging,?  CT chest without contrast versus chest x-ray in 4 weeks to ensure resolution of pneumonia findings. ?Clinically improved.  Still has some cough no dyspnea. ?Completed 3 days of IV azithromycin and ceftriaxone. ?Discharged on cefpodoxime to complete total 7 days course (treating longer due to MS and poor cough) ?Recommend repeating chest x-ray in 4 weeks to ensure resolution of pneumonia findings. ?

## 2021-12-04 NOTE — Progress Notes (Signed)
Patient has had two large loose stools since 7 pm. She also had a large loose stool early this morning. She is requesting that she get something to help with her loose stools. Patient stated her primary care MD has suggested that she takes imodium when her stools are loose The on call was notified and declined. Patient has an area under her sacrum that is concerning her that she doesn't want to get worse. She is refusing her senokot tonight.  ?

## 2021-12-04 NOTE — Plan of Care (Signed)
  Problem: Activity: Goal: Ability to tolerate increased activity will improve Outcome: Progressing   Problem: Clinical Measurements: Goal: Ability to maintain a body temperature in the normal range will improve Outcome: Progressing   Problem: Respiratory: Goal: Ability to maintain adequate ventilation will improve Outcome: Progressing Goal: Ability to maintain a clear airway will improve Outcome: Progressing   

## 2021-12-04 NOTE — Assessment & Plan Note (Addendum)
At home, essentially bedbound. ?Family and caregivers assist her to chair using a Hoyer lift. ?Has neurogenic bladder likely related to MS and has a chronic Urostomy. ?PT evaluation appreciated, at baseline patient is a total assist with all gross functional mobility and utilizes up power wheelchair.  She has aide coming home.  Patient able to feed herself and perform facial hygiene.  At baseline functioning and recommended home health PT. ?Continue PTA meds including amantadine, baclofen and Neurontin. ?

## 2021-12-04 NOTE — Evaluation (Signed)
Clinical/Bedside Swallow Evaluation ?Patient Details  ?Name: [name] ?MRN: [id number] ?Date of Birth: 05-Mar-1971 ? ?Today's Date: 12/04/2021 ?Time: SLP Start Time (ACUTE ONLY): 1120 SLP Stop Time (ACUTE ONLY): 1150 ?SLP Time Calculation (min) (ACUTE ONLY): 30 min ? ?Past Medical History:  ?Past Medical History:  ?Diagnosis Date  ? Abdominal wall hernia 09/05/2014  ? 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 Edema leg 09/05/2014  ? History of construction of external stoma of urinary system 04/29/2015  ? MS (multiple sclerosis) ([place]) 2005  ? 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 Neurogenic bowel 08/12/2012  ? Neuropathic pain 05/21/2015  ? ?Past Surgical History:  ?Past Surgical History:  ?Procedure Laterality Date  ? ABDOMINAL HYSTERECTOMY  2001  ? BACK SURGERY  2004  ? REVISION UROSTOMY CUTANEOUS  06/2012  ? ?HPI:  ?Per admitting H&P "Ms. [name] is a 51 year old female with multiple sclerosis, bedbound state, presents emergency department from home via EMS for chief concerns of shortness of breath, productive cough.    Initial vitals in the emergency department showed temperature of 98.3, respiration rate of 20, heart rate 93, blood pressure 115/89, SPO2 of 98% on room air.    Serum sodium 137, potassium 3.9, chloride 101, bicarb 27, BUN of 13, serum creatinine of 0.62, nonfasting blood glucose 80, GFR greater than 60, high sensitive troponin was 5.  Lactic acid was 1.0.    WBC 12.1, hemoglobin 13.2, platelets 279.    COVID/influenza A/influenza B PCR were negative.    D-dimer was elevated at 1.01.     CT of the chest for PE was read as no evidence of central pulmonary artery embolus.  Left lower lobe pneumonia versus aspiration.  Fatty liver.    ED treatment: Ceftriaxone 1 g IV, azithromycin 500 mg IV, LR 1 L bolus.     At bedside patient is able to tell me her name, her age, she knows the current year and she knows she is in the hospital.  She reports she has been having increased cough and  shortness of breath for the last several days.  She also endorses that her home health aide has been recently sick as well and is currently out.     She reports that she has been having fever, with Tmax today of 100.5 at home.  She states the fever resolved after taking Tylenol.     She also endorses intractable nausea that is minimally relieved with the IV nausea medication given in the ED.       She states the cough has been productive of dark yellow sputum.  She denies chest pain, abdominal pain, vomiting. She reports that the cough has been preventing her from getting adequate and appropriate sleep.     Social history: Is at home with her mother.  She denies tobacco, EtOH, recreational drug use.     Vaccination history: She states she is vaccinated for COVID-19 and influenza.   "  ?  ?Assessment / Plan / Recommendation  ?Clinical Impression ? Pt presents with mild dysphagia. She reports increased epidodes of "choking" while eating but said her family attributes it to her eating too fast and taking in large bites at a time. Today, Pt tolerated all consistencies well witht he exception of occasional cough after thin liquids. When asked to hold the liquid in her mouth prior to swallowing, no s/s of aspiration were noted. Slow but adequate oral transit with solids. Rec continue with current regular diet with thin  liquids with aspiration precautions. Rec meds be given whole with applesauce or other thick items. pt was pleasant and agreed to slow her intake, take smallwer bites and hold liquids prior to swallowing to prevent spillage prior to airway protection. Pt has ALS, speech Dysarthric but intelligible. Pt aware of need to over articulate, minimize distractions, and have good lighting when communicating. ST to follow up with toleration of diet 1-3days ?SLP Visit Diagnosis: Dysphagia, oropharyngeal phase (R13.12) ?   ?Aspiration Risk ? Mild aspiration risk  ?  ?Diet Recommendation Regular;Thin liquid  ? ?Liquid  Administration via: Cup;Straw ?Medication Administration: Whole meds with puree ?Supervision: Patient able to self feed ?Compensations: Minimize environmental distractions;Slow rate;Small sips/bites;Other (Comment) (Hold liquids in mouth for one second prior to swallow) ?Postural Changes: Seated upright at 90 degrees;Remain upright for at least 30 minutes after po intake  ?  ?Other  Recommendations   PT following  ? ?Recommendations for follow up therapy are one component of a multi-disciplinary discharge planning process, led by the attending physician.  Recommendations may be updated based on patient status, additional functional criteria and insurance authorization. ? ?Follow up Recommendations No SLP follow up  ? ? ?  ?Assistance Recommended at Discharge  No ST needed  ?Functional Status Assessment    ?Frequency and Duration Other (Comment) (f/u x1)  ?  ?  ?   ? ?Prognosis   Good ? ?  ? ?Swallow Study   ?General Date of Onset: 12/03/21 ?HPI: Per admitting H&P "Ms. [name] is a 51 year old female with multiple sclerosis, bedbound state, presents emergency department from home via EMS for chief concerns of shortness of breath, productive cough.    Initial vitals in the emergency department showed temperature of 98.3, respiration rate of 20, heart rate 93, blood pressure 115/89, SPO2 of 98% on room air.    Serum sodium 137, potassium 3.9, chloride 101, bicarb 27, BUN of 13, serum creatinine of 0.62, nonfasting blood glucose 80, GFR greater than 60, high sensitive troponin was 5.  Lactic acid was 1.0.    WBC 12.1, hemoglobin 13.2, platelets 279.    COVID/influenza A/influenza B PCR were negative.    D-dimer was elevated at 1.01.     CT of the chest for PE was read as no evidence of central pulmonary artery embolus.  Left lower lobe pneumonia versus aspiration.  Fatty liver.    ED treatment: Ceftriaxone 1 g IV, azithromycin 500 mg IV, LR 1 L bolus.     At bedside patient is able to tell me her name, her age,  she knows the current year and she knows she is in the hospital.  She reports she has been having increased cough and shortness of breath for the last several days.  She also endorses that her home health aide has been recently sick as well and is currently out.     She reports that she has been having fever, with Tmax today of 100.5 at home.  She states the fever resolved after taking Tylenol.     She also endorses intractable nausea that is minimally relieved with the IV nausea medication given in the ED.       She states the cough has been productive of dark yellow sputum.  She denies chest pain, abdominal pain, vomiting. She reports that the cough has been preventing her from getting adequate and appropriate sleep.     Social history: Is at home with her mother.  She denies tobacco, EtOH,  recreational drug use.     Vaccination history: She states she is vaccinated for COVID-19 and influenza.   " ?Type of Study: Bedside Swallow Evaluation ?Diet Prior to this Study: Regular ?History of Recent Intubation: No ?Behavior/Cognition: Alert;Pleasant mood;Cooperative ?Oral Cavity Assessment: Within Functional Limits ?Oral Cavity - Dentition: Adequate natural dentition ?Vision: Functional for self-feeding ?Self-Feeding Abilities: Able to feed self ?Patient Positioning: Upright in bed ?Baseline Vocal Quality: Low vocal intensity ?Volitional Cough: Strong ?Volitional Swallow: Able to elicit  ?  ?Oral/Motor/Sensory Function Overall Oral Motor/Sensory Function: Within functional limits   ?Ice Chips Ice chips: Within functional limits ?Presentation: Self Fed   ?Thin Liquid Thin Liquid: Impaired (Cough x1 with cup sips) ?Presentation: Cup;Spoon;Straw ?Pharyngeal  Phase Impairments: Throat Clearing - Immediate (x1 of 5 sips without cues to hold before swallowing)  ?  ?Nectar Thick Nectar Thick Liquid: Not tested   ?Honey Thick Honey Thick Liquid: Not tested   ?Puree Puree: Within functional limits   ?Solid ? ? ?  Solid: Within  functional limits ?Presentation: Self Fed  ? ?  ? ?Lucila Maine ?12/04/2021,12:07 PM ? ? ? ?

## 2021-12-05 DIAGNOSIS — L8990 Pressure ulcer of unspecified site, unspecified stage: Secondary | ICD-10-CM | POA: Insufficient documentation

## 2021-12-05 MED ORDER — BENZONATATE 100 MG PO CAPS
200.0000 mg | ORAL_CAPSULE | Freq: Three times a day (TID) | ORAL | Status: DC
  Administered 2021-12-05 – 2021-12-06 (×4): 200 mg via ORAL
  Filled 2021-12-05 (×4): qty 2

## 2021-12-05 NOTE — TOC Initial Note (Signed)
Transition of Care (TOC) - Initial/Assessment Note  ? ? ?Patient Details  ?Name: [name] ?MRN: [id number] ?Date of Birth: [birth year] ? ?Transition of Care (TOC) CM/SW Contact:    ?Joseph Art, LCSW ?Phone Number: [phone] ?12/05/2021, 9:52 AM ? ?Clinical Narrative:                 ? ?Patient presents to [facility] due to c/o nasal congestion, SOB and productive cough x1 wk that worsened today. Pt bed-bound w/ chronic MS.  Patient lives at home with family, mother Dianey Suchy [phone] is main caregiver. CSW spoke with patient who stated she has assistance at home.  CSW updated patient PT has recommended home health PT and patient stated she is amenable to home health serviced. Patient stated she has used [facility] and [facility] in the past.  CSW sent request to both, pending reply. CSW requested d/c update from Attending, who stated patient will likely d/c tomorrow 12/06/2021. ? ?Expected Discharge Plan: Home w Home Health Services ?Barriers to Discharge: Continued Medical Work up ? ? ?Patient Goals and CMS Choice ?Patient states their goals for this hospitalization and ongoing recovery are:: T o return home, ok with home health PT ?  ?  ? ?Expected Discharge Plan and Services ?Expected Discharge Plan: Home w Home Health Services ?In-house Referral: Clinical Social Work ?  ?Post Acute Care Choice: Home Health ?Living arrangements for the past 2 months: Single Family Home ?                ?  ?  ?  ?  ?  ?  ?  ?  ?  ?  ? ?Prior Living Arrangements/Services ?Living arrangements for the past 2 months: Single Family Home ?Lives with:: Parents, Siblings ?Patient language and need for interpreter reviewed:: Yes ?       ?Need for Family Participation in Patient Care: Yes (Comment) ?Care giver support system in place?: Yes (comment) ?  ?Criminal Activity/Legal Involvement Pertinent to Current Situation/Hospitalization: No - Comment as needed ? ?Activities of Daily Living ?Home Assistive Devices/Equipment: Water quality scientist, Other (Comment), Raised toilet seat with rails, Hospital bed (power chair, ramp,) ?ADL Screening (condition at time of admission) ?Patient's cognitive ability adequate to safely complete daily activities?: Yes ?Is the patient deaf or have difficulty hearing?: No ?Does the patient have difficulty seeing, even when wearing glasses/contacts?: No ?Does the patient have difficulty concentrating, remembering, or making decisions?: No ?Patient able to express need for assistance with ADLs?: Yes ?Does the patient have difficulty dressing or bathing?: Yes ?Independently performs ADLs?: No ?Communication: Independent ?Dressing (OT): Needs assistance ?Is this a change from baseline?: Pre-admission baseline ?Grooming: Needs assistance ?Is this a change from baseline?: Pre-admission baseline ?Feeding: Independent ?Bathing: Needs assistance ?Is this a change from baseline?: Pre-admission baseline ?Toileting: Needs assistance ?Is this a change from baseline?: Pre-admission baseline ?In/Out Bed: Dependent ?Is this a change from baseline?: Pre-admission baseline ?Walks in Home: Dependent ?Is this a change from baseline?: Pre-admission baseline ?Does the patient have difficulty walking or climbing stairs?: Yes ?Weakness of Legs: Both ?Weakness of Arms/Hands: Both ? ?Permission Sought/Granted ?Permission sought to share information with : Family Supports ?Permission granted to share information with : Yes, Verbal Permission Granted ? Share Information with NAME: Dawson, Hollman Mother [phone]   [phone] ?   ?   ?   ? ?Emotional Assessment ?Appearance:: Appears stated age ?Attitude/Demeanor/Rapport: Engaged ?Affect (typically observed): Stable ?Orientation: : Oriented to Self, Oriented to Place, Oriented to  Time, Oriented to Situation ?Alcohol / Substance Use: Not Applicable ?Psych Involvement: No (comment) ? ?Admission diagnosis:  Fatty liver [K76.0] ?Community acquired pneumonia [J18.9] ?Positive D dimer  [R79.89] ?Community acquired pneumonia of left lower lobe of lung [J18.9] ?Sepsis, due to unspecified organism, unspecified whether acute organ dysfunction present (HCC) [A41.9] ?CAP (community acquired pneumonia) [J18.9] ?Patient Active Problem List  ? Diagnosis Date Noted  ? Nausea and vomiting 12/04/2021  ? Community acquired pneumonia 12/03/2021  ? Difficult intravenous access 12/03/2021  ? Weakness of upper extremity 09/03/2021  ? Sepsis (HCC) 09/02/2021  ? Altered mental status 04/10/2017  ? Lower urinary tract infectious disease 08/09/2015  ? Exacerbation of multiple sclerosis (HCC) 08/09/2015  ? 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 Multiple sclerosis (HCC)   ? Neuropathic pain 05/21/2015  ? History of construction of external stoma of urinary system 04/29/2015  ? History of spinal surgery 04/29/2015  ? Edema leg 09/05/2014  ? Adiposity 09/05/2014  ? Abdominal wall hernia 09/05/2014  ? Infection of urinary tract 04/07/2006  ? ?PCP:  [name], MD ?Pharmacy:   ?[facility], [place], [place] - [address] ?[address] ?[place] [address] ?Phone: [phone] Fax: [phone] ? ?[id number] - [place], [place] - [address] ?[phone] ?[place] [address] ?Phone: [phone] Fax: [phone] ? ?[id number] - [place], [place] - [address] ?[address] ?[place] [address] ?Phone: [phone] Fax: [phone] ? ? ? ? ?Social Determinants of Health (SDOH) Interventions ?  ? ?Readmission Risk Interventions ?No flowsheet data found. ? ? ?

## 2021-12-05 NOTE — Consult Note (Signed)
WOC Nurse Consult Note: ?Reason for Consult:Stage 2 partial thickness pressure injury to sacrum. MASD Erythema intertrigo) to the inframammary areas (bilateral). ?Wound type:Pressure, moisture ?Pressure Injury POA: Yes ?Measurement:2cm x 5cm x 0.1cm Pink, moist, scant exudate (serous). Mask (maceration from perspiration) to inframammary areas ?Wound bed:As noted above ?Drainage (amount, consistency, odor) As noted above ?Periwound: intact ?Dressing procedure/placement/frequency: Patient is being turned and repositioned from side to side and time in the supine position minimized. I have provided topical care guidance for the sacral lesion consistent with the Skin Care Order Set using a silicone foam changed daily. The house moisture wicking textile, InterDry, is being used in the inframammary areas per the manufacturer's instructions: ? ?Measure and cut length of InterDry to fit in skin folds that have skin breakdown ?Tuck InterDry fabric into skin folds in a single layer, allow for 2 inches of overhang from skin edges to allow for wicking to occur ?May remove to bathe; dry area thoroughly and then tuck into affected areas again ?Do not apply any creams or ointments when using InterDry ?DO NOT THROW AWAY FOR 5 DAYS unless soiled with stool ?DO NOT [facility] product, this will inactivate the silver in the material  ?New sheet of Interdry should be applied after 5 days of use if patient continues to have skin breakdown  ?  ? ?I was assisted in the assessment and care plan development for this patient by the Bedside RN, A. Orvan Falconer. ? ? ?WOC nursing team will not follow, but will remain available to this patient, the nursing and medical teams.  Please re-consult if needed. ?Thanks, ?Ladona Mow, MSN, RN, GNP, CWOCN, CWON-AP, FAAN  ?Pager# [phone]  ? ?  ?

## 2021-12-05 NOTE — Plan of Care (Signed)
?  Problem: Activity: ?Goal: Ability to tolerate increased activity will improve ?12/05/2021 0032 by Hollie Salk, RN ?Outcome: Progressing ?12/05/2021 0031 by Hollie Salk, RN ?Outcome: Progressing ?  ?Problem: Clinical Measurements: ?Goal: Ability to maintain a body temperature in the normal range will improve ?12/05/2021 0032 by Hollie Salk, RN ?Outcome: Progressing ?12/05/2021 0031 by Hollie Salk, RN ?Outcome: Progressing ?  ?Problem: Respiratory: ?Goal: Ability to maintain adequate ventilation will improve ?12/05/2021 0032 by Hollie Salk, RN ?Outcome: Progressing ?12/05/2021 0031 by Hollie Salk, RN ?Outcome: Progressing ?Goal: Ability to maintain a clear airway will improve ?12/05/2021 0032 by Hollie Salk, RN ?Outcome: Progressing ?12/05/2021 0031 by Hollie Salk, RN ?Outcome: Progressing ?  ?

## 2021-12-05 NOTE — Assessment & Plan Note (Addendum)
Pressure Injury 12/05/21 Buttocks Left;Lower Stage 2 -  Partial thickness loss of dermis presenting as a shallow open injury with a red, pink wound bed without slough. (Active)  ?12/05/21 1034  ?Location: Buttocks  ?Location Orientation: Left;Lower  ?Staging: Stage 2 -  Partial thickness loss of dermis presenting as a shallow open injury with a red, pink wound bed without slough.  ?Wound Description (Comments):   ?Present on Admission: Yes  ? ?Home health RN for education/assistance with wound management at home. ? ? ? ?

## 2021-12-05 NOTE — Progress Notes (Signed)
PROGRESS NOTE   [name]  XJD:[id number]    DOB: Jul 22, 1971    DOA: 12/03/2021  PCP: [name], MD   I have briefly reviewed patients previous medical records in [facility].  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  CTA chest negative for pulmonary embolism but confirmed left lower lobe pneumonia.  Admitted for lobar pneumonia.  Improving slowly.   Assessment & Plan:  Assessment and Plan: * Community acquired pneumonia CTA chest negative for pulmonary embolism but confirmed left lower lobe pneumonia Patient's caregiver recently sick with respiratory symptoms after a flu shot. Continue empirically started IV ceftriaxone and azithromycin, day 3. Given history of some coughing while oral intake, had ST evaluated who recommended regular diet and thin liquids. Poor cough due to MS, aggressive pulmonary toilet/chest PT. Recommend follow-up chest x-ray in 4 weeks to ensure resolution of pneumonia findings. Although afebrile, no leukocytosis, concerned given weak cough from her multiple sclerosis and still has significant hacking cough.  Continue current management for additional 24 hours and hopefully will improve and can DC home tomorrow.  Nausea and vomiting She denies having nausea and vomiting prior to hospital arrival. Reports an episode of nausea and vomiting after receiving IV antibiotics. Currently asymptomatic and tolerating diet. Resolved. Had some mild diarrhea/loose stools yesterday, got a dose of Imodium last night and has improved.  Low index of suspicion for C. difficile.  Monitor.  Multiple  sclerosis (HCC) At home, essentially bedbound. Family and caregivers assist her to chair using a Hoyer lift. Has neurogenic bladder likely related to MS and has a chronic Urostomy. PT evaluation appreciated, at baseline patient is a total assist with all gross functional mobility and utilizes up power wheelchair.  She has aide coming home.  Patient able to feed herself and perform facial hygiene.  At baseline functioning and recommended home health PT. Continue PTA meds including amantadine, baclofen and Neurontin.  Pressure injury of skin Pressure Injury 12/05/21 Buttocks Left;Lower Stage 2 -  Partial thickness loss of dermis presenting as a shallow open injury with a red, pink wound bed without slough. (Active)  12/05/21 1034  Location: Buttocks  Location Orientation: Left;Lower  Staging: Stage 2 -  Partial thickness loss of dermis presenting as a shallow open injury with a red, pink wound bed without slough.  Wound Description (Comments):   Present on Admission: Yes         Body mass index is 37.59 kg/m.   DVT prophylaxis: Place TED hose Start: 12/03/21 2248     Code Status: Full Code:  Family Communication: None at bedside Disposition:  Status is: Inpatient Remains inpatient appropriate because: IV antibiotics    Consultants:   None  Procedures:   None  Antimicrobials:   IV ceftriaxone and azithromycin   Subjective:  Overall feels better.  Cough is less but still has intermittent hacking cough.  No dyspnea.  Tolerating diet without nausea or vomiting.  Had some loose stools yesterday and last BM was last night.  Stays with her mother and 46 year old grand nephew who assist in her care.  Objective:   Vitals:   12/04/21 2017 12/05/21 0018 12/05/21 3612 12/05/21 2449  BP: 134/83 (!) 142/55 140/61 121/66  Pulse: 95 96 91 88  Resp: 20 20 20 18   Temp: 98.3 F (36.8 C) 98.5 F (36.9 C) 99 F (37.2 C) 98.2 F (36.8 C)  TempSrc:      SpO2: 100% 94% 96% 95%   Weight:      Height:        General exam: Young female, moderately built and obese lying comfortably propped up in bed.  Does not look septic or toxic. Respiratory system: Occasional left basal crackles but otherwise clear to auscultation.  No increased work of breathing.  Seen coughing less today than yesterday but cough remains weak. Cardiovascular system: S1 & S2 heard, RRR. No JVD, murmurs, rubs, gallops or clicks. No pedal edema.  Not on telemetry. Gastrointestinal system: Abdomen is nondistended, soft and nontender. No organomegaly or masses felt. Normal bowel sounds heard. Central nervous system: Alert and oriented. No focal neurological deficits. Extremities: Symmetric grade 4 x 4 power in upper extremities and 0 x 5 power in lower extremities.  Bilateral legs with compression stockings. Skin: No rashes, lesions or ulcers Psychiatry: Judgement and insight appear normal. Mood & affect appropriate.     Data Reviewed:   I have personally reviewed following labs and imaging studies   CBC: Recent Labs  Lab 12/03/21 1859 12/04/21 0648  WBC 12.1* 9.6  NEUTROABS 7.6  --   HGB 13.2 12.5  HCT 41.9 39.0  MCV 92.1 90.9  PLT 279 268    Basic Metabolic Panel: Recent Labs  Lab 12/03/21 1859 12/04/21 0648  NA 137 137  K 3.9 3.6  CL 101 101  CO2 27 27  GLUCOSE 80 80  BUN 13 9  CREATININE 0.62 0.57  CALCIUM 8.9 8.7*    Liver Function Tests: No results for input(s): AST, ALT, ALKPHOS, BILITOT, PROT, ALBUMIN in the last 168 hours.  CBG: Recent Labs  Lab 12/04/21 1035 12/04/21 1711  GLUCAP 100* 106*    Microbiology Studies:   Recent Results (from the past 240 hour(s))  Resp Panel by RT-PCR (Flu A&B, Covid) Nasopharyngeal Swab     Status: None   Collection Time: 12/03/21  6:16 PM   Specimen: Nasopharyngeal Swab; Nasopharyngeal(NP) swabs in vial transport medium  Result Value Ref Range Status   SARS Coronavirus 2 by RT PCR NEGATIVE NEGATIVE Final    Comment:  (NOTE) SARS-CoV-2 target nucleic acids are NOT DETECTED.  The SARS-CoV-2 RNA is generally detectable in upper respiratory specimens during the acute phase of infection. The lowest concentration of SARS-CoV-2 viral copies this assay can detect is 138 copies/mL. A negative result does not preclude SARS-Cov-2 infection and should not be used as the sole basis for treatment or other patient management decisions. A negative result may occur with  improper specimen collection/handling, submission of specimen other than nasopharyngeal swab, presence of viral mutation(s) within the areas targeted by this assay, and inadequate number of viral copies(<138 copies/mL). A negative result must be combined with clinical observations, patient history, and epidemiological information. The expected result is Negative.  Fact Sheet for Patients:  02/02/22  Fact Sheet for Healthcare Providers:  BloggerCourse.com  This test is no t yet approved or cleared by the [place] FDA and  has been authorized for detection and/or diagnosis of SARS-CoV-2 by FDA under an Emergency Use Authorization (EUA). This EUA will remain  in effect (meaning this test can be used) for the duration of the COVID-19 declaration under Section 564(b)(1) of the Act, 21  U.S.C.section 360bbb-3(b)(1), unless the authorization is terminated  or revoked sooner.       Influenza A by PCR NEGATIVE NEGATIVE Final   Influenza B by PCR NEGATIVE NEGATIVE Final    Comment: (NOTE) The Xpert Xpress SARS-CoV-2/FLU/RSV plus assay is intended as an aid in the diagnosis of influenza from Nasopharyngeal swab specimens and should not be used as a sole basis for treatment. Nasal washings and aspirates are unacceptable for Xpert Xpress SARS-CoV-2/FLU/RSV testing.  Fact Sheet for Patients: BloggerCourse.com  Fact Sheet for Healthcare  Providers: SeriousBroker.it  This test is not yet approved or cleared by the [place] FDA and has been authorized for detection and/or diagnosis of SARS-CoV-2 by FDA under an Emergency Use Authorization (EUA). This EUA will remain in effect (meaning this test can be used) for the duration of the COVID-19 declaration under Section 564(b)(1) of the Act, 21 U.S.C. section 360bbb-3(b)(1), unless the authorization is terminated or revoked.  Performed at [facility], [address] [place], [place] [address]   Blood Culture (routine x 2)     Status: None (Preliminary result)   Collection Time: 12/03/21  9:47 PM   Specimen: BLOOD  Result Value Ref Range Status   Specimen Description BLOOD BLOOD RIGHT FOREARM  Final   Special Requests   Final    BOTTLES DRAWN AEROBIC AND ANAEROBIC Blood Culture adequate volume   Culture   Final    NO GROWTH 2 DAYS Performed at [facility], [address] [place], [place] [address]    Report Status PENDING  Incomplete  Blood Culture (routine x 2)     Status: None (Preliminary result)   Collection Time: 12/03/21  9:47 PM   Specimen: BLOOD  Result Value Ref Range Status   Specimen Description BLOOD BLOOD LEFT HAND  Final   Special Requests IN PEDIATRIC BOTTLE Blood Culture adequate volume  Final   Culture   Final    NO GROWTH 2 DAYS Performed at [facility], [address] [place], [place] [address]    Report Status PENDING  Incomplete    Radiology Studies:  DG Chest 2 View  Result Date: 12/03/2021 CLINICAL DATA:  Productive cough. EXAM: CHEST - 2 VIEW COMPARISON:  September 02, 2021. FINDINGS: The heart size and mediastinal contours are within normal limits. Hypoinflation of the lungs with mild bibasilar subsegmental atelectasis is noted. The visualized skeletal structures are unremarkable. IMPRESSION: Hypoinflation of the lungs with mild bibasilar subsegmental atelectasis. Electronically  Signed   By: Lupita Raider M.D.   On: 12/03/2021 18:41   CT Angio Chest PE W and/or Wo Contrast  Result Date: 12/03/2021 CLINICAL DATA:  Concern for pulmonary embolism. EXAM: CT ANGIOGRAPHY CHEST WITH CONTRAST TECHNIQUE: Multidetector CT imaging of the chest was performed using the standard protocol during bolus administration of intravenous contrast. Multiplanar CT image reconstructions and MIPs were obtained to evaluate the vascular anatomy. RADIATION DOSE REDUCTION: This exam was performed according to the departmental dose-optimization program which includes automated exposure control, adjustment of the mA and/or kV according to patient size and/or use of iterative reconstruction technique. CONTRAST:  80mL OMNIPAQUE IOHEXOL 350 MG/ML SOLN COMPARISON:  Chest CT dated 09/03/2021 and radiograph dated 12/03/2021. FINDINGS: Evaluation of this exam is limited due to respiratory motion artifact. Cardiovascular: There is no cardiomegaly or pericardial effusion. The thoracic aorta is unremarkable. The origins of the great vessels of the aortic arch appear patent. Evaluation of the pulmonary arteries is limited due to respiratory motion  artifact. No central pulmonary artery embolus identified. Mediastinum/Nodes: No hilar or mediastinal adenopathy. The esophagus and thyroid gland are grossly unremarkable. No mediastinal fluid collection. Lungs/Pleura: Clusters of nodular and ground-glass opacity primarily involving the left lower lobe most consistent with pneumonia. Aspiration is not excluded. Patchy and streaky density in the right lower lobe may represent atelectasis or infiltrate. There is no pleural effusion or pneumothorax. The central airways are patent. Upper Abdomen: Fatty liver.  Renal cortical scarring and infarct. Musculoskeletal: No chest wall abnormality. No acute or significant osseous findings. Review of the MIP images confirms the above findings. IMPRESSION: 1. No CT evidence of central pulmonary  artery embolus. 2. Left lower lobe pneumonia versus aspiration. 3. Fatty liver. Electronically Signed   By: Elgie Collard M.D.   On: 12/03/2021 20:58    Scheduled Meds:    amantadine  100 mg Oral BID   azithromycin  500 mg Oral QHS   baclofen  20 mg Oral BID   benzonatate  200 mg Oral TID   cholecalciferol  1,000 Units Oral Daily   DULoxetine  30 mg Oral BID   enoxaparin (LOVENOX) injection  0.5 mg/kg Subcutaneous Q24H   gabapentin  200 mg Oral q morning   gabapentin  800 mg Oral QHS   guaiFENesin  600 mg Oral BID   pneumococcal 20-valent conjugate vaccine  0.5 mL Intramuscular Tomorrow-1000   senna-docusate  1 tablet Oral QHS    Continuous Infusions:    cefTRIAXone (ROCEPHIN)  IV 2 g (12/04/21 2139)     LOS: 1 day     Marcellus Scott, MD,  FACP, [facility], [facility], [facility] (Care Management Physician Certified) [facility] Ad[facility] [place]  To contact the attending provider between 7A-7P or the covering provider during after hours 7P-7A, please log into the web site [email or web address] and access using universal [place] password for that web site. If you do not have the password, please call the hospital operator.  12/05/2021, 11:02 AM

## 2021-12-06 MED ORDER — CEFPODOXIME PROXETIL 200 MG PO TABS: 200.0000 mg | ORAL_TABLET | Freq: Two times a day (BID) | ORAL | 0 refills | Status: AC

## 2021-12-06 MED ORDER — GUAIFENESIN ER 600 MG PO TB12: 600.0000 mg | ORAL_TABLET | Freq: Two times a day (BID) | ORAL | 0 refills | Status: AC

## 2021-12-06 MED ORDER — BENZONATATE 200 MG PO CAPS: 200.0000 mg | ORAL_CAPSULE | Freq: Three times a day (TID) | ORAL | 0 refills | Status: DC

## 2021-12-06 MED ORDER — GUAIFENESIN-DM 100-10 MG/5ML PO SYRP: 5.0000 mL | ORAL_SOLUTION | ORAL | 0 refills | Status: DC | PRN

## 2021-12-06 NOTE — Plan of Care (Signed)
?  Problem: Activity: ?Goal: Ability to tolerate increased activity will improve ?Outcome: Adequate for Discharge ?  ?Problem: Clinical Measurements: ?Goal: Ability to maintain a body temperature in the normal range will improve ?Outcome: Adequate for Discharge ?  ?Problem: Respiratory: ?Goal: Ability to maintain adequate ventilation will improve ?Outcome: Adequate for Discharge ?Goal: Ability to maintain a clear airway will improve ?Outcome: Adequate for Discharge ?  ?Problem: Education: ?Goal: Knowledge of General Education information will improve ?Description: Including pain rating scale, medication(s)/side effects and non-pharmacologic comfort measures ?Outcome: Adequate for Discharge ?  ?Problem: Health Behavior/Discharge Planning: ?Goal: Ability to manage health-related needs will improve ?Outcome: Adequate for Discharge ?  ?

## 2021-12-06 NOTE — TOC Progression Note (Signed)
Transition of Care (TOC) - Progression Note  ? ? ?Patient Details  ?Name: [name] ?MRN: [id number] ?Date of Birth: [birth year] ? ?Transition of Care (TOC) CM/SW Contact  ?Marlowe Sax, RN ?Phone Number: ?12/06/2021, 11:00 AM ? ?Clinical Narrative:   [facility] accepted the patient for [facility] services ? ? ? ?Expected Discharge Plan: Home w Home Health Services ?Barriers to Discharge: Continued Medical Work up ? ?Expected Discharge Plan and Services ?Expected Discharge Plan: Home w Home Health Services ?In-house Referral: Clinical Social Work ?  ?Post Acute Care Choice: Home Health ?Living arrangements for the past 2 months: Single Family Home ?                ?  ?  ?  ?  ?  ?  ?  ?  ?  ?  ? ? ?Social Determinants of Health (SDOH) Interventions ?  ? ?Readmission Risk Interventions ?No flowsheet data found. ? ?

## 2021-12-06 NOTE — TOC Progression Note (Signed)
Transition of Care (TOC) - Progression Note  ? ? ?Patient Details  ?Name: [name] ?MRN: [id number] ?Date of Birth: [birth year] ? ?Transition of Care (TOC) CM/SW Contact  ?Marlowe Sax, RN ?Phone Number: ?12/06/2021, 9:15 AM ? ?Clinical Narrative:   Reached out to [facility], [place] for [facility], awaiting a response ? ? ? ? ?Expected Discharge Plan: Home w Home Health Services ?Barriers to Discharge: Continued Medical Work up ? ?Expected Discharge Plan and Services ?Expected Discharge Plan: Home w Home Health Services ?In-house Referral: Clinical Social Work ?  ?Post Acute Care Choice: Home Health ?Living arrangements for the past 2 months: Single Family Home ?                ?  ?  ?  ?  ?  ?  ?  ?  ?  ?  ? ? ?Social Determinants of Health (SDOH) Interventions ?  ? ?Readmission Risk Interventions ?No flowsheet data found. ? ?

## 2021-12-06 NOTE — Discharge Summary (Signed)
Physician Discharge Summary  [name] HQI:[id number] DOB: 09/23/1971  PCP: [name], MD  Admitted from: Home Discharged to: Home  Admit date: 12/03/2021 Discharge date: 12/06/2021  Recommendations for Outpatient Follow-up:    Follow-up Information     [name], MD. Go on 12/23/2021.   Specialty: Internal Medicine Why: To be seen with repeat labs (CBC & BMP).;  Appt @ 9:30 am Contact information: [address] [place] [address] [phone]                  Home Health: Home Health Orders (From admission, onward)     Start     Ordered   12/06/21 1056  Home Health  At discharge       Comments: RN for wound care Mx  Question Answer Comment  To provide the following care/treatments PT   To provide the following care/treatments RN      12/06/21 1101             Equipment/Devices: None    Discharge Condition: Improved and stable.   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  CTA chest negative for pulmonary embolism but confirmed left lower lobe pneumonia.  Admitted for lobar pneumonia.  Improving slowly.  Assessment and Plan: * Community acquired pneumonia CTA chest negative for pulmonary embolism but confirmed left lower lobe  pneumonia Patient's caregiver recently sick with respiratory symptoms after a flu shot. Continue empirically started IV ceftriaxone and azithromycin, day 3. Given history of some coughing while oral intake, had ST evaluated who recommended regular diet and thin liquids. Poor cough due to MS, aggressive pulmonary toilet/chest PT. Recommend follow-up imaging,?  CT chest without contrast versus chest x-ray in 4 weeks to ensure resolution of pneumonia findings. Clinically improved.  Still has some cough no dyspnea. Completed 3 days of IV azithromycin and ceftriaxone. Discharged on cefpodoxime to complete total 7 days course (treating longer due to MS and poor cough) Recommend repeating chest x-ray in 4 weeks to ensure resolution of pneumonia findings.  Nausea and vomiting She denies having nausea and vomiting prior to hospital arrival. Reports an episode of nausea and vomiting after receiving IV antibiotics. Resolved and tolerating diet. Had mild self-limiting diarrhea, resolved after a dose of Imodium.  Multiple sclerosis (HCC) At home, essentially bedbound. Family and caregivers assist her to chair using a Hoyer lift. Has neurogenic bladder likely related to MS and has a chronic Urostomy. PT evaluation appreciated, at baseline patient is a total assist with all gross functional mobility and utilizes up power wheelchair.  She has aide coming home.  Patient able to feed herself and perform facial hygiene.  At baseline functioning and recommended home health PT. Continue PTA meds including amantadine, baclofen  and Neurontin.  Pressure injury of skin Pressure Injury 12/05/21 Buttocks Left;Lower Stage 2 -  Partial thickness loss of dermis presenting as a shallow open injury with a red, pink wound bed without slough. (Active)  12/05/21 1034  Location: Buttocks  Location Orientation: Left;Lower  Staging: Stage 2 -  Partial thickness loss of dermis presenting as a shallow open injury with a red,  pink wound bed without slough.  Wound Description (Comments):   Present on Admission: Yes   Home health RN for education/assistance with wound management at home.      Body mass index is 37.59 kg/m.   Consultations: None  Procedures: None   Discharge Instructions  Discharge Instructions     Call MD for:  difficulty breathing, headache or visual disturbances   Complete by: As directed    Call MD for:  extreme fatigue   Complete by: As directed    Call MD for:  persistant dizziness or light-headedness   Complete by: As directed    Call MD for:  persistant nausea and vomiting   Complete by: As directed    Call MD for:  severe uncontrolled pain   Complete by: As directed    Call MD for:  temperature >100.4   Complete by: As directed    Diet - low sodium heart healthy   Complete by: As directed    Discharge wound care:   Complete by: As directed    Wound care  Daily      Comments: Wound care to Stage 2 partial thickness pressure injury at sacrum: Cleanse with NS, pat dry. Cover with silicone foam for sacrum.  Turn patient side to side and minimize time in the supine position. Change silicone foam daily.   Increase activity slowly   Complete by: As directed         Medication List     TAKE these medications    acetaminophen 500 MG tablet Commonly known as: TYLENOL Take 500 mg by mouth every 6 (six) hours as needed.   amantadine 100 MG capsule Commonly known as: SYMMETREL Take 100 mg by mouth 2 (two) times daily.   baclofen 20 MG tablet Commonly known as: LIORESAL Take 20 mg by mouth 2 (two) times daily.   benzonatate 200 MG capsule Commonly known as: TESSALON Take 1 capsule (200 mg total) by mouth 3 (three) times daily.   cefpodoxime 200 MG tablet Commonly known as: VANTIN Take 1 tablet (200 mg total) by mouth 2 (two) times daily for 5 days.   cholecalciferol 1000 units tablet Commonly known as: VITAMIN D Take 1,000 Units by mouth daily.    Cranberry 400 MG Caps Take 400 mg by mouth daily.   DULoxetine 30 MG capsule Commonly known as: CYMBALTA Take 30 mg by mouth 2 (two) times daily.   gabapentin 100 MG capsule Commonly known as: NEURONTIN Take 200 mg by mouth every morning.   gabapentin 800 MG tablet Commonly known as: NEURONTIN Take 800 mg by mouth at bedtime.   guaiFENesin 600 MG 12 hr tablet Commonly known as: MUCINEX Take 1 tablet (600 mg total) by mouth 2 (two) times daily for 7 days.   guaiFENesin-dextromethorphan 100-10 MG/5ML syrup Commonly known as: ROBITUSSIN DM Take 5 mLs by mouth every 4 (four) hours as needed for cough.   Ozempic (1 MG/DOSE) 4 MG/3ML Sopn Generic drug: Semaglutide (1 MG/DOSE) Inject 1 mg into the skin once a week. On Monday   Stimulant Laxative 8.6-50 MG tablet Generic drug:  senna-docusate Take 1 tablet by mouth at bedtime.       Allergies  Allergen Reactions   Latex Swelling and Other (See Comments)    Reaction:  Facial swelling       Procedures/Studies: DG Chest 2 View  Result Date: 12/03/2021 CLINICAL DATA:  Productive cough. EXAM: CHEST - 2 VIEW COMPARISON:  September 02, 2021. FINDINGS: The heart size and mediastinal contours are within normal limits. Hypoinflation of the lungs with mild bibasilar subsegmental atelectasis is noted. The visualized skeletal structures are unremarkable. IMPRESSION: Hypoinflation of the lungs with mild bibasilar subsegmental atelectasis. Electronically Signed   By: Lupita Raider M.D.   On: 12/03/2021 18:41   CT Angio Chest PE W and/or Wo Contrast  Result Date: 12/03/2021 CLINICAL DATA:  Concern for pulmonary embolism. EXAM: CT ANGIOGRAPHY CHEST WITH CONTRAST TECHNIQUE: Multidetector CT imaging of the chest was performed using the standard protocol during bolus administration of intravenous contrast. Multiplanar CT image reconstructions and MIPs were obtained to evaluate the vascular anatomy. RADIATION DOSE REDUCTION: This exam was  performed according to the departmental dose-optimization program which includes automated exposure control, adjustment of the mA and/or kV according to patient size and/or use of iterative reconstruction technique. CONTRAST:  80mL OMNIPAQUE IOHEXOL 350 MG/ML SOLN COMPARISON:  Chest CT dated 09/03/2021 and radiograph dated 12/03/2021. FINDINGS: Evaluation of this exam is limited due to respiratory motion artifact. Cardiovascular: There is no cardiomegaly or pericardial effusion. The thoracic aorta is unremarkable. The origins of the great vessels of the aortic arch appear patent. Evaluation of the pulmonary arteries is limited due to respiratory motion artifact. No central pulmonary artery embolus identified. Mediastinum/Nodes: No hilar or mediastinal adenopathy. The esophagus and thyroid gland are grossly unremarkable. No mediastinal fluid collection. Lungs/Pleura: Clusters of nodular and ground-glass opacity primarily involving the left lower lobe most consistent with pneumonia. Aspiration is not excluded. Patchy and streaky density in the right lower lobe may represent atelectasis or infiltrate. There is no pleural effusion or pneumothorax. The central airways are patent. Upper Abdomen: Fatty liver.  Renal cortical scarring and infarct. Musculoskeletal: No chest wall abnormality. No acute or significant osseous findings. Review of the MIP images confirms the above findings. IMPRESSION: 1. No CT evidence of central pulmonary artery embolus. 2. Left lower lobe pneumonia versus aspiration. 3. Fatty liver. Electronically Signed   By: Elgie Collard M.D.   On: 12/03/2021 20:58      Subjective: Seen with patient's RN in the room.  Overall feels better.  Still has some residual cough, mostly nonproductive.  Occasionally feels nauseous posttussive.  Denies dyspnea.  No vomiting.  No diarrhea since dose of Imodium couple days ago.  Discharge Exam:  Vitals:   12/05/21 1944 12/05/21 1954 12/06/21 0431 12/06/21  0836  BP: 138/88  (!) 117/50 138/60  Pulse: 98 (!) 110 79 86  Resp: Temp: 98.6 F (37 C)  98.1 F (36.7 C) 98.2 F (36.8 C)  TempSrc:      SpO2: 94% 97% 97% 97%  Weight:      Height:        General exam: Young female, moderately built and obese lying comfortably propped up in bed.  Does not look septic or toxic. Respiratory system: Occasional left basal crackles but otherwise clear to auscultation without wheezing or rhonchi.  No increased work of breathing. Cardiovascular system: S1 & S2 heard, RRR. No JVD, murmurs, rubs, gallops or clicks. No pedal edema.   Gastrointestinal system: Abdomen  is nondistended, soft and nontender. No organomegaly or masses felt. Normal bowel sounds heard.  Has urostomy. Central nervous system: Alert and oriented. No focal neurological deficits. Extremities: Symmetric grade 4 x 4 power in upper extremities and 0 x 5 power in lower extremities.  Bilateral legs with compression stockings. Skin: No rashes, lesions or ulcers Psychiatry: Judgement and insight appear normal. Mood & affect appropriate    The results of significant diagnostics from this hospitalization (including imaging, microbiology, ancillary and laboratory) are listed below for reference.     Microbiology: Recent Results (from the past 240 hour(s))  Resp Panel by RT-PCR (Flu A&B, Covid) Nasopharyngeal Swab     Status: None   Collection Time: 12/03/21  6:16 PM   Specimen: Nasopharyngeal Swab; Nasopharyngeal(NP) swabs in vial transport medium  Result Value Ref Range Status   SARS Coronavirus 2 by RT PCR NEGATIVE NEGATIVE Final    Comment: (NOTE) SARS-CoV-2 target nucleic acids are NOT DETECTED.  The SARS-CoV-2 RNA is generally detectable in upper respiratory specimens during the acute phase of infection. The lowest concentration of SARS-CoV-2 viral copies this assay can detect is 138 copies/mL. A negative result does not preclude SARS-Cov-2 infection and should not be used  as the sole basis for treatment or other patient management decisions. A negative result may occur with  improper specimen collection/handling, submission of specimen other than nasopharyngeal swab, presence of viral mutation(s) within the areas targeted by this assay, and inadequate number of viral copies(<138 copies/mL). A negative result must be combined with clinical observations, patient history, and epidemiological information. The expected result is Negative.  Fact Sheet for Patients:  BloggerCourse.com  Fact Sheet for Healthcare Providers:  SeriousBroker.it  This test is no t yet approved or cleared by the [place] FDA and  has been authorized for detection and/or diagnosis of SARS-CoV-2 by FDA under an Emergency Use Authorization (EUA). This EUA will remain  in effect (meaning this test can be used) for the duration of the COVID-19 declaration under Section 564(b)(1) of the Act, 21 U.S.C.section 360bbb-3(b)(1), unless the authorization is terminated  or revoked sooner.       Influenza A by PCR NEGATIVE NEGATIVE Final   Influenza B by PCR NEGATIVE NEGATIVE Final    Comment: (NOTE) The Xpert Xpress SARS-CoV-2/FLU/RSV plus assay is intended as an aid in the diagnosis of influenza from Nasopharyngeal swab specimens and should not be used as a sole basis for treatment. Nasal washings and aspirates are unacceptable for Xpert Xpress SARS-CoV-2/FLU/RSV testing.  Fact Sheet for Patients: BloggerCourse.com  Fact Sheet for Healthcare Providers: SeriousBroker.it  This test is not yet approved or cleared by the [place] FDA and has been authorized for detection and/or diagnosis of SARS-CoV-2 by FDA under an Emergency Use Authorization (EUA). This EUA will remain in effect (meaning this test can be used) for the duration of the COVID-19 declaration under Section 564(b)(1)  of the Act, 21 U.S.C. section 360bbb-3(b)(1), unless the authorization is terminated or revoked.  Performed at [facility], [address] [place], [place] [address]   Blood Culture (routine x 2)     Status: None (Preliminary result)   Collection Time: 12/03/21  9:47 PM   Specimen: BLOOD  Result Value Ref Range Status   Specimen Description BLOOD BLOOD RIGHT FOREARM  Final   Special Requests   Final    BOTTLES DRAWN AEROBIC AND ANAEROBIC Blood Culture adequate volume   Culture   Final    NO GROWTH 3  DAYS Performed at [facility], [address] [place], [place] [address]    Report Status PENDING  Incomplete  Blood Culture (routine x 2)     Status: None (Preliminary result)   Collection Time: 12/03/21  9:47 PM   Specimen: BLOOD  Result Value Ref Range Status   Specimen Description BLOOD BLOOD LEFT HAND  Final   Special Requests IN PEDIATRIC BOTTLE Blood Culture adequate volume  Final   Culture   Final    NO GROWTH 3 DAYS Performed at [facility], [address] [place], [place] [address]    Report Status PENDING  Incomplete     Labs: CBC: Recent Labs  Lab 12/03/21 1859 12/04/21 0648  WBC 12.1* 9.6  NEUTROABS 7.6  --   HGB 13.2 12.5  HCT 41.9 39.0  MCV 92.1 90.9  PLT 279 268    Basic Metabolic Panel: Recent Labs  Lab 12/03/21 1859 12/04/21 0648  NA 137 137  K 3.9 3.6  CL 101 101  CO2 27 27  GLUCOSE 80 80  BUN 13 9  CREATININE 0.62 0.57  CALCIUM 8.9 8.7*    Liver Function Tests: No results for input(s): AST, ALT, ALKPHOS, BILITOT, PROT, ALBUMIN in the last 168 hours.  CBG: Recent Labs  Lab 12/04/21 1035 12/04/21 1711  GLUCAP 100* 106*    I discussed in detail with patient's mother via phone, updated care and answered all questions.  Time coordinating discharge: 35 minutes  SIGNED:  Marcellus Scott, MD,  FACP, [facility], [facility], [facility] (Care Management Physician Certified). [facility]  To  contact the attending provider between 7A-7P or the covering provider during after hours 7P-7A, please log into the web site [email or web address] and access using universal [place] password for that web site. If you do not have the password, please call the hospital operator.

## 2021-12-06 NOTE — TOC Progression Note (Signed)
Transition of Care (TOC) - Progression Note  ? ? ?Patient Details  ?Name: [name] ?MRN: [id number] ?Date of Birth: [birth year] ? ?Transition of Care (TOC) CM/SW Contact  ?Marlowe Sax, RN ?Phone Number: ?12/06/2021, 11:25 AM ? ?Clinical Narrative:   The patient stated that she needs EMS to transport homer, EMS has been called ?She is 2nd on list ? ? ? ?Expected Discharge Plan: Home w Home Health Services ?Barriers to Discharge: Continued Medical Work up ? ?Expected Discharge Plan and Services ?Expected Discharge Plan: Home w Home Health Services ?In-house Referral: Clinical Social Work ?  ?Post Acute Care Choice: Home Health ?Living arrangements for the past 2 months: Single Family Home ?Expected Discharge Date: 12/06/21               ?  ?  ?  ?  ?  ?  ?  ?  ?  ?  ? ? ?Social Determinants of Health (SDOH) Interventions ?  ? ?Readmission Risk Interventions ?No flowsheet data found. ? ?

## 2021-12-06 NOTE — Progress Notes (Signed)
Speech Language Pathology Treatment:    ?Patient Details ?Name: [name] ?MRN: [id number] ?DOB: 09/12/1971 ?Today's Date: 12/06/2021 ?Time: 1020-1035 ?SLP Time Calculation (min) (ACUTE ONLY): 15 min ? ?Assessment / Plan / Recommendation ?Clinical Impression ? Pt seen for diet tolerance. Pt pleasant and cooperative. Endorsed difficulty completing oral hold and swallow as previous recommended by SLP on 12/03/21. Endorse no difficulty swallowing when taking small bite/sips. ? ?Observed pt consuming ~4 oz of water. After initial cueing, pt taking small sips. No overt s/sx pharyngeal dysphagia across trials.  ? ?Per chart review, temp and most recent WBC WNL. No recent chest imaging.  ? ?Recommend continuation of a regular diet with thin liquids with safe swallowing strategies/aspiration precautions as outlined below including, but not limited to, assistance for positioning, upright positioning during meals, small/single bites/sips, and set up with POs. ? ?Reviewed diet recommendations, safe swallowing strategies/aspiration precautions, and SLP POC with pt and RN. Pt verbalized understanding/agreement.  ? ?SLP to sign off as pt has no acute SLP needs at this time.  ?  ?HPI HPI: Per admitting H&P "[name] is a 51 year old female with multiple sclerosis, bedbound state, presents emergency department from home via EMS for chief concerns of shortness of breath, productive cough.    Initial vitals in the emergency department showed temperature of 98.3, respiration rate of 20, heart rate 93, blood pressure 115/89, SPO2 of 98% on room air.    Serum sodium 137, potassium 3.9, chloride 101, bicarb 27, BUN of 13, serum creatinine of 0.62, nonfasting blood glucose 80, GFR greater than 60, high sensitive troponin was 5.  Lactic acid was 1.0.    WBC 12.1, hemoglobin 13.2, platelets 279.    COVID/influenza A/influenza B PCR were negative.    D-dimer was elevated at 1.01.     CT of the chest for PE was read as no evidence  of central pulmonary artery embolus.  Left lower lobe pneumonia versus aspiration.  Fatty liver.    ED treatment: Ceftriaxone 1 g IV, azithromycin 500 mg IV, LR 1 L bolus.     At bedside patient is able to tell me her name, her age, she knows the current year and she knows she is in the hospital.  She reports she has been having increased cough and shortness of breath for the last several days.  She also endorses that her home health aide has been recently sick as well and is currently out.     She reports that she has been having fever, with Tmax today of 100.5 at home.  She states the fever resolved after taking Tylenol.     She also endorses intractable nausea that is minimally relieved with the IV nausea medication given in the ED.       She states the cough has been productive of dark yellow sputum.  She denies chest pain, abdominal pain, vomiting. She reports that the cough has been preventing her from getting adequate and appropriate sleep.     Social history: Is at home with her mother.  She denies tobacco, EtOH, recreational drug use.     Vaccination history: She states she is vaccinated for COVID-19 and influenza.   " ?  ?   ?SLP Plan ? All goals met ? ?  ?  ?Recommendations for follow up therapy are one component of a multi-disciplinary discharge planning process, led by the attending physician.  Recommendations may be updated based on patient status, additional functional criteria and insurance  authorization. ?  ? ?Recommendations  ?Diet recommendations: Regular;Thin liquid ?Medication Administration: Whole meds with puree ?Supervision: Patient able to self feed;Intermittent supervision to cue for compensatory strategies (set up) ?Compensations: Minimize environmental distractions;Slow rate;Small sips/bites;Other (Comment) (single bites/sips) ?Postural Changes and/or Swallow Maneuvers: Seated upright 90 degrees;Upright 30-60 min after meal  ?   ?    ?   ? ? ? ? Follow Up Recommendations: No SLP follow  up ?Assistance recommended at discharge: Intermittent Supervision/Assistance ?SLP Visit Diagnosis: Dysphagia, oropharyngeal phase (R13.12) ?Plan: All goals met ? ? ? ? ?  ?  ? ?Cherrie Gauze, M.S., CCC-SLP ?Speech-Language Pathologist ?[facility] ?[phone])  ? ?Quintella Baton ? ?12/06/2021, 11:29 AM ?

## 2021-12-06 NOTE — Plan of Care (Signed)
Patient sleeping between care. No new changes in assessment. ? ? ?Problem: Activity: ?Goal: Ability to tolerate increased activity will improve ?Outcome: Progressing ?  ?Problem: Clinical Measurements: ?Goal: Ability to maintain a body temperature in the normal range will improve ?Outcome: Progressing ?  ?Problem: Respiratory: ?Goal: Ability to maintain adequate ventilation will improve ?Outcome: Progressing ?Goal: Ability to maintain a clear airway will improve ?Outcome: Progressing ?  ?

## 2021-12-07 DIAGNOSIS — Z09 Encounter for follow-up examination after completed treatment for conditions other than malignant neoplasm: Principal | ICD-10-CM

## 2021-12-08 LAB — CULTURE, BLOOD (ROUTINE X 2)
Culture: NO GROWTH
Culture: NO GROWTH
Special Requests: ADEQUATE
Special Requests: ADEQUATE

## 2021-12-08 MED ORDER — AMANTADINE HCL 100 MG CAPSULE
ORAL_CAPSULE | Freq: Two times a day (BID) | ORAL | 3 refills | 30 days
Start: 2021-12-08 — End: ?

## 2021-12-09 DIAGNOSIS — G35 Multiple sclerosis: Principal | ICD-10-CM

## 2021-12-09 MED ORDER — AMANTADINE HCL 100 MG CAPSULE
ORAL_CAPSULE | Freq: Two times a day (BID) | ORAL | 3 refills | 30 days | Status: CP
Start: 2021-12-09 — End: ?

## 2021-12-09 MED ORDER — DULOXETINE 30 MG CAPSULE,DELAYED RELEASE
ORAL_CAPSULE | Freq: Every day | ORAL | 5 refills | 90 days
Start: 2021-12-09 — End: 2023-03-28

## 2021-12-23 ENCOUNTER — Ambulatory Visit: Admit: 2021-12-23 | Discharge: 2021-12-24 | Payer: MEDICARE

## 2021-12-23 DIAGNOSIS — H5111 Convergence insufficiency: Principal | ICD-10-CM

## 2021-12-23 DIAGNOSIS — L821 Other seborrheic keratosis: Principal | ICD-10-CM

## 2021-12-23 DIAGNOSIS — Z09 Encounter for follow-up examination after completed treatment for conditions other than malignant neoplasm: Principal | ICD-10-CM

## 2021-12-23 DIAGNOSIS — K5909 Other constipation: Principal | ICD-10-CM

## 2021-12-23 DIAGNOSIS — G35 Multiple sclerosis: Principal | ICD-10-CM

## 2021-12-23 MED ORDER — OZEMPIC 1 MG/DOSE (2 MG/1.5 ML) SUBCUTANEOUS PEN INJECTOR
SUBCUTANEOUS | 2 refills | 28 days | Status: CN
Start: 2021-12-23 — End: ?

## 2021-12-23 MED ORDER — OZEMPIC 2 MG/DOSE (8 MG/3 ML) SUBCUTANEOUS PEN INJECTOR
SUBCUTANEOUS | 0 refills | 28 days | Status: CN
Start: 2021-12-23 — End: ?

## 2021-12-23 MED ORDER — SENNOSIDES 8.6 MG TABLET
ORAL_TABLET | Freq: Two times a day (BID) | ORAL | 11 refills | 30 days | Status: CP
Start: 2021-12-23 — End: ?

## 2021-12-23 MED ORDER — BACLOFEN 20 MG TABLET
ORAL_TABLET | Freq: Two times a day (BID) | ORAL | 4 refills | 60 days | Status: CP
Start: 2021-12-23 — End: ?

## 2021-12-23 MED ORDER — POLYETHYLENE GLYCOL 3350 17 GRAM ORAL POWDER PACKET
PACK | Freq: Two times a day (BID) | ORAL | 0 refills | 45 days | Status: CP
Start: 2021-12-23 — End: ?

## 2021-12-27 ENCOUNTER — Ambulatory Visit
Admit: 2021-12-27 | Discharge: 2021-12-28 | Payer: MEDICARE | Attending: Student in an Organized Health Care Education/Training Program | Primary: Student in an Organized Health Care Education/Training Program

## 2021-12-27 DIAGNOSIS — L820 Inflamed seborrheic keratosis: Principal | ICD-10-CM

## 2022-02-07 ENCOUNTER — Telehealth: Admit: 2022-02-07 | Discharge: 2022-02-08 | Payer: MEDICARE

## 2022-02-07 DIAGNOSIS — M25512 Pain in left shoulder: Principal | ICD-10-CM

## 2022-02-07 DIAGNOSIS — K5909 Other constipation: Principal | ICD-10-CM

## 2022-02-07 DIAGNOSIS — I10 Essential (primary) hypertension: Principal | ICD-10-CM

## 2022-02-07 DIAGNOSIS — G35 Multiple sclerosis: Principal | ICD-10-CM

## 2022-02-07 DIAGNOSIS — E669 Obesity, unspecified: Principal | ICD-10-CM

## 2022-02-07 DIAGNOSIS — M792 Neuralgia and neuritis, unspecified: Principal | ICD-10-CM

## 2022-02-07 DIAGNOSIS — K592 Neurogenic bowel, not elsewhere classified: Principal | ICD-10-CM

## 2022-02-07 DIAGNOSIS — R252 Cramp and spasm: Principal | ICD-10-CM

## 2022-02-08 ENCOUNTER — Ambulatory Visit: Admit: 2022-02-08 | Discharge: 2022-02-09 | Payer: MEDICARE

## 2022-02-15 ENCOUNTER — Ambulatory Visit
Admit: 2022-02-15 | Discharge: 2022-02-16 | Payer: MEDICARE | Attending: Physician Assistant | Primary: Physician Assistant

## 2022-02-15 DIAGNOSIS — G35 Multiple sclerosis: Principal | ICD-10-CM

## 2022-03-07 ENCOUNTER — Ambulatory Visit: Admit: 2022-03-07 | Discharge: 2022-03-08 | Payer: MEDICARE

## 2022-03-07 DIAGNOSIS — H53413 Scotoma involving central area, bilateral: Principal | ICD-10-CM

## 2022-03-07 DIAGNOSIS — H5111 Convergence insufficiency: Principal | ICD-10-CM

## 2022-03-07 DIAGNOSIS — G35 Multiple sclerosis: Principal | ICD-10-CM

## 2022-04-14 DIAGNOSIS — G35 Multiple sclerosis: Principal | ICD-10-CM

## 2022-04-25 DIAGNOSIS — G35 Multiple sclerosis: Principal | ICD-10-CM

## 2022-04-25 MED ORDER — DULOXETINE 30 MG CAPSULE,DELAYED RELEASE
ORAL_CAPSULE | Freq: Every day | ORAL | 3 refills | 90 days
Start: 2022-04-25 — End: 2023-03-16

## 2022-04-26 MED ORDER — OZEMPIC 1 MG/DOSE (2 MG/1.5 ML) SUBCUTANEOUS PEN INJECTOR
SUBCUTANEOUS | 2 refills | 28 days
Start: 2022-04-26 — End: ?

## 2022-04-26 MED ORDER — AMANTADINE HCL 100 MG CAPSULE
ORAL_CAPSULE | Freq: Two times a day (BID) | ORAL | 3 refills | 30 days
Start: 2022-04-26 — End: ?

## 2022-04-26 MED ORDER — DULOXETINE 30 MG CAPSULE,DELAYED RELEASE
ORAL_CAPSULE | Freq: Every day | ORAL | 3 refills | 90 days | Status: CP
Start: 2022-04-26 — End: 2023-03-17

## 2022-04-27 MED ORDER — AMANTADINE HCL 100 MG CAPSULE
ORAL_CAPSULE | Freq: Two times a day (BID) | ORAL | 3 refills | 30 days | Status: CP
Start: 2022-04-27 — End: ?

## 2022-04-27 MED ORDER — OZEMPIC 1 MG/DOSE (2 MG/1.5 ML) SUBCUTANEOUS PEN INJECTOR
SUBCUTANEOUS | 2 refills | 28 days | Status: CP
Start: 2022-04-27 — End: ?

## 2022-05-04 DIAGNOSIS — G35 Multiple sclerosis: Principal | ICD-10-CM

## 2022-05-16 DIAGNOSIS — Z9889 Other specified postprocedural states: Principal | ICD-10-CM

## 2022-06-14 ENCOUNTER — Ambulatory Visit: Admit: 2022-06-14 | Discharge: 2022-06-14 | Payer: MEDICARE

## 2022-06-23 MED ORDER — SENNOSIDES 8.6 MG TABLET
ORAL_TABLET | Freq: Two times a day (BID) | ORAL | 11 refills | 30 days
Start: 2022-06-23 — End: ?

## 2022-06-24 MED ORDER — SENNOSIDES 8.6 MG TABLET
ORAL_TABLET | Freq: Two times a day (BID) | ORAL | 11 refills | 30 days | Status: CP
Start: 2022-06-24 — End: ?

## 2022-07-04 DIAGNOSIS — Z1211 Encounter for screening for malignant neoplasm of colon: Principal | ICD-10-CM

## 2022-07-04 DIAGNOSIS — G35 Multiple sclerosis: Principal | ICD-10-CM

## 2022-07-04 DIAGNOSIS — Z1212 Encounter for screening for malignant neoplasm of rectum: Principal | ICD-10-CM

## 2022-07-04 MED ORDER — GABAPENTIN 800 MG TABLET
ORAL_TABLET | Freq: Every evening | ORAL | 5 refills | 60 days
Start: 2022-07-04 — End: ?

## 2022-07-07 MED ORDER — GABAPENTIN 800 MG TABLET
ORAL_TABLET | Freq: Every evening | ORAL | 5 refills | 60 days | Status: CP
Start: 2022-07-07 — End: ?

## 2022-07-25 MED ORDER — OZEMPIC 1 MG/DOSE (2 MG/1.5 ML) SUBCUTANEOUS PEN INJECTOR
SUBCUTANEOUS | 2 refills | 28 days
Start: 2022-07-25 — End: ?

## 2022-07-26 MED ORDER — OZEMPIC 1 MG/DOSE (2 MG/1.5 ML) SUBCUTANEOUS PEN INJECTOR
SUBCUTANEOUS | 2 refills | 28 days | Status: CP
Start: 2022-07-26 — End: ?

## 2022-08-08 MED ORDER — GABAPENTIN 100 MG CAPSULE
ORAL_CAPSULE | Freq: Every day | ORAL | 3 refills | 135 days | Status: CP
Start: 2022-08-08 — End: ?

## 2022-08-23 ENCOUNTER — Ambulatory Visit
Admit: 2022-08-23 | Discharge: 2022-08-24 | Payer: MEDICARE | Attending: Physician Assistant | Primary: Physician Assistant

## 2022-08-23 DIAGNOSIS — G35 Multiple sclerosis: Principal | ICD-10-CM

## 2022-08-23 MED ORDER — GABAPENTIN 100 MG CAPSULE
ORAL_CAPSULE | Freq: Every day | ORAL | 1 refills | 90 days | Status: CP
Start: 2022-08-23 — End: ?

## 2022-08-23 MED ORDER — BACLOFEN 20 MG TABLET
ORAL_TABLET | Freq: Four times a day (QID) | ORAL | 1 refills | 90 days | Status: CP
Start: 2022-08-23 — End: ?

## 2022-08-23 MED ORDER — GABAPENTIN 800 MG TABLET
ORAL_TABLET | Freq: Every evening | ORAL | 1 refills | 90 days | Status: CP
Start: 2022-08-23 — End: ?

## 2022-08-23 MED ORDER — AMANTADINE HCL 100 MG CAPSULE
ORAL_CAPSULE | Freq: Two times a day (BID) | ORAL | 1 refills | 90 days | Status: CP
Start: 2022-08-23 — End: ?

## 2022-08-26 ENCOUNTER — Encounter: Admit: 2022-08-26 | Payer: MEDICARE

## 2022-08-26 ENCOUNTER — Encounter: Admit: 2022-08-26 | Discharge: 2022-09-24 | Payer: MEDICARE

## 2022-08-26 ENCOUNTER — Inpatient Hospital Stay: Admit: 2022-08-24 | Payer: MEDICARE

## 2022-08-26 ENCOUNTER — Ambulatory Visit: Admit: 2022-08-26 | Payer: MEDICARE

## 2022-08-29 DIAGNOSIS — G35 Multiple sclerosis: Principal | ICD-10-CM

## 2022-09-01 DIAGNOSIS — G35 Multiple sclerosis: Principal | ICD-10-CM

## 2022-09-05 DIAGNOSIS — G35 Multiple sclerosis: Principal | ICD-10-CM

## 2022-09-08 DIAGNOSIS — G35 Multiple sclerosis: Principal | ICD-10-CM

## 2022-09-12 DIAGNOSIS — G35 Multiple sclerosis: Principal | ICD-10-CM

## 2022-09-20 DIAGNOSIS — G35 Multiple sclerosis: Principal | ICD-10-CM

## 2022-09-21 ENCOUNTER — Ambulatory Visit: Admit: 2022-09-21 | Discharge: 2022-09-22 | Payer: MEDICARE | Attending: Urology | Primary: Urology

## 2022-09-21 DIAGNOSIS — N319 Neuromuscular dysfunction of bladder, unspecified: Principal | ICD-10-CM

## 2022-09-21 DIAGNOSIS — Z9889 Other specified postprocedural states: Principal | ICD-10-CM

## 2022-09-21 DIAGNOSIS — N390 Urinary tract infection, site not specified: Principal | ICD-10-CM

## 2022-09-24 DIAGNOSIS — G35 Multiple sclerosis: Principal | ICD-10-CM

## 2022-10-04 MED ORDER — GABAPENTIN 100 MG CAPSULE
ORAL_CAPSULE | Freq: Three times a day (TID) | ORAL | 1 refills | 90 days | Status: CP
Start: 2022-10-04 — End: ?

## 2022-10-28 MED ORDER — OZEMPIC 1 MG/DOSE (2 MG/1.5 ML) SUBCUTANEOUS PEN INJECTOR
SUBCUTANEOUS | 2 refills | 28 days | Status: CP
Start: 2022-10-28 — End: ?

## 2022-11-03 ENCOUNTER — Ambulatory Visit: Admit: 2022-11-03 | Discharge: 2022-11-04 | Payer: MEDICARE

## 2022-11-03 DIAGNOSIS — G35 Multiple sclerosis: Principal | ICD-10-CM

## 2022-11-03 DIAGNOSIS — Z131 Encounter for screening for diabetes mellitus: Principal | ICD-10-CM

## 2022-11-03 DIAGNOSIS — B351 Tinea unguium: Principal | ICD-10-CM

## 2022-11-03 DIAGNOSIS — R799 Abnormal finding of blood chemistry, unspecified: Principal | ICD-10-CM

## 2022-11-03 DIAGNOSIS — M79604 Pain in right leg: Principal | ICD-10-CM

## 2022-11-03 DIAGNOSIS — M79605 Pain in left leg: Principal | ICD-10-CM

## 2022-11-03 DIAGNOSIS — K5900 Constipation, unspecified: Principal | ICD-10-CM

## 2022-11-03 DIAGNOSIS — K592 Neurogenic bowel, not elsewhere classified: Principal | ICD-10-CM

## 2022-11-03 DIAGNOSIS — E669 Obesity, unspecified: Principal | ICD-10-CM

## 2022-11-03 DIAGNOSIS — Z8639 Personal history of other endocrine, nutritional and metabolic disease: Principal | ICD-10-CM

## 2022-11-03 MED ORDER — OZEMPIC 1 MG/DOSE (4 MG/3 ML) SUBCUTANEOUS PEN INJECTOR
SUBCUTANEOUS | 0 refills | 84 days | Status: CP
Start: 2022-11-03 — End: 2023-02-01

## 2022-11-03 MED ORDER — DULOXETINE 60 MG CAPSULE,DELAYED RELEASE
ORAL_CAPSULE | Freq: Every day | ORAL | 3 refills | 90 days | Status: CP
Start: 2022-11-03 — End: 2023-10-29

## 2022-11-03 MED ORDER — POLYETHYLENE GLYCOL 3350 17 GRAM ORAL POWDER PACKET
PACK | Freq: Every day | ORAL | 3 refills | 100 days | Status: CP
Start: 2022-11-03 — End: ?

## 2022-11-17 DIAGNOSIS — N6459 Other signs and symptoms in breast: Principal | ICD-10-CM

## 2023-01-09 ENCOUNTER — Ambulatory Visit: Admit: 2023-01-09 | Discharge: 2023-01-10 | Payer: MEDICARE

## 2023-01-09 DIAGNOSIS — I959 Hypotension, unspecified: Principal | ICD-10-CM

## 2023-01-09 DIAGNOSIS — E876 Hypokalemia: Principal | ICD-10-CM

## 2023-01-09 DIAGNOSIS — G35 Multiple sclerosis: Principal | ICD-10-CM

## 2023-01-09 DIAGNOSIS — K5909 Other constipation: Principal | ICD-10-CM

## 2023-01-09 DIAGNOSIS — R109 Unspecified abdominal pain: Principal | ICD-10-CM

## 2023-01-09 DIAGNOSIS — B370 Candidal stomatitis: Principal | ICD-10-CM

## 2023-01-09 DIAGNOSIS — R682 Dry mouth, unspecified: Principal | ICD-10-CM

## 2023-01-09 DIAGNOSIS — R197 Diarrhea, unspecified: Principal | ICD-10-CM

## 2023-01-09 DIAGNOSIS — R1310 Dysphagia, unspecified: Principal | ICD-10-CM

## 2023-01-09 MED ORDER — PEG 3350-ELECTROLYTES 236 GRAM-22.74 GRAM-6.74 GRAM-5.86 GRAM SOLUTION
Freq: Once | ORAL | 0 refills | 1 days | Status: CP
Start: 2023-01-09 — End: 2023-01-09

## 2023-01-09 MED ORDER — DULOXETINE 60 MG CAPSULE,DELAYED RELEASE
ORAL_CAPSULE | Freq: Every day | ORAL | 3 refills | 90 days | Status: CP
Start: 2023-01-09 — End: 2024-01-04

## 2023-01-09 MED ORDER — CLOTRIMAZOLE 10 MG TROCHE
ORAL_TABLET | ORAL | 0 refills | 14 days | Status: CP
Start: 2023-01-09 — End: 2023-01-23

## 2023-01-09 MED ORDER — POTASSIUM CHLORIDE 20 MEQ ORAL PACKET
PACK | Freq: Two times a day (BID) | ORAL | 0 refills | 1 days | Status: CP
Start: 2023-01-09 — End: ?

## 2023-01-10 ENCOUNTER — Other Ambulatory Visit: Payer: Self-pay

## 2023-01-10 ENCOUNTER — Emergency Department: Admit: 2023-01-10 | Discharge: 2023-01-10 | Disposition: A | Discharge status: Home / Self Care | Payer: MEDICARE

## 2023-01-10 ENCOUNTER — Ambulatory Visit: Admit: 2023-01-10 | Discharge: 2023-01-10 | Disposition: A | Discharge status: Home / Self Care | Payer: MEDICARE

## 2023-01-10 DIAGNOSIS — G9341 Metabolic encephalopathy: Secondary | ICD-10-CM | POA: Diagnosis present

## 2023-01-10 DIAGNOSIS — Z79899 Other long term (current) drug therapy: Secondary | ICD-10-CM

## 2023-01-10 DIAGNOSIS — N390 Urinary tract infection, site not specified: Secondary | ICD-10-CM | POA: Diagnosis present

## 2023-01-10 DIAGNOSIS — R09A2 Foreign body sensation, throat: Secondary | ICD-10-CM | POA: Diagnosis present

## 2023-01-10 DIAGNOSIS — G35 Multiple sclerosis: Secondary | ICD-10-CM | POA: Diagnosis present

## 2023-01-10 DIAGNOSIS — G8220 Paraplegia, unspecified: Secondary | ICD-10-CM | POA: Diagnosis present

## 2023-01-10 DIAGNOSIS — K5900 Constipation, unspecified: Secondary | ICD-10-CM | POA: Diagnosis present

## 2023-01-10 DIAGNOSIS — A419 Sepsis, unspecified organism: Secondary | ICD-10-CM | POA: Diagnosis present

## 2023-01-10 DIAGNOSIS — Z1152 Encounter for screening for COVID-19: Secondary | ICD-10-CM

## 2023-01-10 DIAGNOSIS — B961 Klebsiella pneumoniae [K. pneumoniae] as the cause of diseases classified elsewhere: Secondary | ICD-10-CM | POA: Diagnosis present

## 2023-01-10 DIAGNOSIS — Y846 Urinary catheterization as the cause of abnormal reaction of the patient, or of later complication, without mention of misadventure at the time of the procedure: Secondary | ICD-10-CM | POA: Diagnosis present

## 2023-01-10 DIAGNOSIS — Z91040 Latex allergy status: Secondary | ICD-10-CM

## 2023-01-10 DIAGNOSIS — Z8249 Family history of ischemic heart disease and other diseases of the circulatory system: Secondary | ICD-10-CM

## 2023-01-10 DIAGNOSIS — T83510A Infection and inflammatory reaction due to cystostomy catheter, initial encounter: Principal | ICD-10-CM | POA: Diagnosis present

## 2023-01-10 DIAGNOSIS — I5032 Chronic diastolic (congestive) heart failure: Secondary | ICD-10-CM | POA: Diagnosis present

## 2023-01-10 DIAGNOSIS — Z6837 Body mass index (BMI) 37.0-37.9, adult: Secondary | ICD-10-CM

## 2023-01-10 DIAGNOSIS — Z7985 Long-term (current) use of injectable non-insulin antidiabetic drugs: Secondary | ICD-10-CM

## 2023-01-10 DIAGNOSIS — Z90710 Acquired absence of both cervix and uterus: Secondary | ICD-10-CM

## 2023-01-10 DIAGNOSIS — F32A Depression, unspecified: Secondary | ICD-10-CM | POA: Diagnosis present

## 2023-01-10 DIAGNOSIS — R1310 Dysphagia, unspecified: Secondary | ICD-10-CM | POA: Diagnosis present

## 2023-01-10 DIAGNOSIS — E669 Obesity, unspecified: Secondary | ICD-10-CM | POA: Diagnosis present

## 2023-01-10 DIAGNOSIS — K592 Neurogenic bowel, not elsewhere classified: Secondary | ICD-10-CM | POA: Diagnosis present

## 2023-01-10 LAB — CBC WITH DIFFERENTIAL/PLATELET
Abs Immature Granulocytes: 0.04 10*3/uL (ref 0.00–0.07)
Basophils Absolute: 0.1 10*3/uL (ref 0.0–0.1)
Basophils Relative: 0 %
Eosinophils Absolute: 0.2 10*3/uL (ref 0.0–0.5)
Eosinophils Relative: 2 %
HCT: 37.6 % (ref 36.0–46.0)
Hemoglobin: 12.5 g/dL (ref 12.0–15.0)
Immature Granulocytes: 0 %
Lymphocytes Relative: 18 %
Lymphs Abs: 2.3 10*3/uL (ref 0.7–4.0)
MCH: 30.1 pg (ref 26.0–34.0)
MCHC: 33.2 g/dL (ref 30.0–36.0)
MCV: 90.6 fL (ref 80.0–100.0)
Monocytes Absolute: 1.7 10*3/uL — ABNORMAL HIGH (ref 0.1–1.0)
Monocytes Relative: 13 %
Neutro Abs: 8.9 10*3/uL — ABNORMAL HIGH (ref 1.7–7.7)
Neutrophils Relative %: 67 %
Platelets: 303 10*3/uL (ref 150–400)
RBC: 4.15 MIL/uL (ref 3.87–5.11)
RDW: 14 % (ref 11.5–15.5)
WBC: 13.2 10*3/uL — ABNORMAL HIGH (ref 4.0–10.5)
nRBC: 0 % (ref 0.0–0.2)

## 2023-01-10 LAB — COMPREHENSIVE METABOLIC PANEL
ALT: 13 U/L (ref 0–44)
AST: 17 U/L (ref 15–41)
Albumin: 3.9 g/dL (ref 3.5–5.0)
Alkaline Phosphatase: 44 U/L (ref 38–126)
Anion gap: 11 (ref 5–15)
BUN: 9 mg/dL (ref 6–20)
CO2: 23 mmol/L (ref 22–32)
Calcium: 9.1 mg/dL (ref 8.9–10.3)
Chloride: 103 mmol/L (ref 98–111)
Creatinine, Ser: 0.46 mg/dL (ref 0.44–1.00)
GFR, Estimated: >60 mL/min (ref >60)
Glucose, Bld: 99 mg/dL (ref 70–99)
Potassium: 3.6 mmol/L (ref 3.5–5.1)
Sodium: 137 mmol/L (ref 135–145)
Total Bilirubin: 1.2 mg/dL (ref 0.3–1.2)
Total Protein: 7.7 g/dL (ref 6.5–8.1)

## 2023-01-10 LAB — TROPONIN I (HIGH SENSITIVITY): Troponin I (High Sensitivity): <2 ng/L (ref <18)

## 2023-01-10 LAB — LACTIC ACID, PLASMA: Lactic Acid, Venous: 0.9 mmol/L (ref 0.5–1.9)

## 2023-01-10 NOTE — ED Triage Notes (Signed)
Pt presents to ER via ems with c/o possible sepsis and new difficulty moving her left arm.  Pt was sent by PCP to r/o both stroke and sepsis.  Pt states ems said her temp was 101 orally, and she was hypotensive.  Pt feels warm to touch.  Pt states she has been able to move her left arm intermittently.  Pt has no other new neurological symptoms at this time.  Pt is otherwise A&O x4 and in NAD.

## 2023-01-10 NOTE — ED Provider Triage Note (Signed)
Emergency Medicine Provider Triage Evaluation Note  [name] , a 52 y.o. female  was evaluated in triage.  Pt complains of left upper extremity weakness and intermittent inability to move her left arm. She has progressive MS. No other weakness. PCP sent her for CT and septic workup. EMS report fever of 101.  Physical Exam  BP 134/69 (BP Location: Right Arm)   Pulse (!) 104   Temp 99.5 F (37.5 C) (Oral)   Resp 19   SpO2 98%  Gen:   Awake, no distress   Resp:  Normal effort  MSK:   Moves right arm freely. Noted movement of the left arm while she was giving HPI. Other:    Medical Decision Making  Medically screening exam initiated at 8:59 PM.  Appropriate orders placed.  [name] was informed that the remainder of the evaluation will be completed by another provider, this initial triage assessment does not replace that evaluation, and the importance of remaining in the ED until their evaluation is complete.    Samantha Pester, FNP 01/10/23 2102

## 2023-01-10 NOTE — ED Triage Notes (Signed)
EMS  brings pt in from home; hx MS; c/o abd pain; seen at [facility] yesterday for same and dx constipation; taking miralax, has colostomy/urostomy

## 2023-01-10 NOTE — ED Notes (Signed)
Attempted iv insertion x1, ed tech calling lab to notify of need for venipuncture assist.

## 2023-01-11 ENCOUNTER — Emergency Department: Payer: 59

## 2023-01-11 ENCOUNTER — Inpatient Hospital Stay
Admission: EM | Admit: 2023-01-11 | Discharge: 2023-01-14 | DRG: 698 | Disposition: A | Discharge status: Home / Self Care | Payer: 59 | Attending: Osteopathic Medicine | Admitting: Osteopathic Medicine

## 2023-01-11 ENCOUNTER — Encounter: Payer: Self-pay | Admitting: Internal Medicine

## 2023-01-11 ENCOUNTER — Inpatient Hospital Stay: Payer: 59

## 2023-01-11 DIAGNOSIS — R09A2 Foreign body sensation, throat: Secondary | ICD-10-CM | POA: Diagnosis present

## 2023-01-11 DIAGNOSIS — E669 Obesity, unspecified: Secondary | ICD-10-CM | POA: Diagnosis present

## 2023-01-11 DIAGNOSIS — Z7985 Long-term (current) use of injectable non-insulin antidiabetic drugs: Secondary | ICD-10-CM | POA: Diagnosis not present

## 2023-01-11 DIAGNOSIS — T83510A Infection and inflammatory reaction due to cystostomy catheter, initial encounter: Secondary | ICD-10-CM | POA: Diagnosis present

## 2023-01-11 DIAGNOSIS — N390 Urinary tract infection, site not specified: Secondary | ICD-10-CM | POA: Diagnosis present

## 2023-01-11 DIAGNOSIS — Y846 Urinary catheterization as the cause of abnormal reaction of the patient, or of later complication, without mention of misadventure at the time of the procedure: Secondary | ICD-10-CM | POA: Diagnosis present

## 2023-01-11 DIAGNOSIS — A419 Sepsis, unspecified organism: Secondary | ICD-10-CM | POA: Diagnosis present

## 2023-01-11 DIAGNOSIS — Z6837 Body mass index (BMI) 37.0-37.9, adult: Secondary | ICD-10-CM | POA: Diagnosis not present

## 2023-01-11 DIAGNOSIS — R1310 Dysphagia, unspecified: Secondary | ICD-10-CM | POA: Diagnosis present

## 2023-01-11 DIAGNOSIS — R1319 Other dysphagia: Secondary | ICD-10-CM

## 2023-01-11 DIAGNOSIS — F32A Depression, unspecified: Secondary | ICD-10-CM | POA: Diagnosis present

## 2023-01-11 DIAGNOSIS — Z91040 Latex allergy status: Secondary | ICD-10-CM | POA: Diagnosis not present

## 2023-01-11 DIAGNOSIS — Z90710 Acquired absence of both cervix and uterus: Secondary | ICD-10-CM | POA: Diagnosis not present

## 2023-01-11 DIAGNOSIS — R29898 Other symptoms and signs involving the musculoskeletal system: Secondary | ICD-10-CM | POA: Diagnosis present

## 2023-01-11 DIAGNOSIS — G35 Multiple sclerosis: Secondary | ICD-10-CM | POA: Diagnosis present

## 2023-01-11 DIAGNOSIS — K592 Neurogenic bowel, not elsewhere classified: Secondary | ICD-10-CM | POA: Diagnosis present

## 2023-01-11 DIAGNOSIS — Z1152 Encounter for screening for COVID-19: Secondary | ICD-10-CM | POA: Diagnosis not present

## 2023-01-11 DIAGNOSIS — Z8249 Family history of ischemic heart disease and other diseases of the circulatory system: Secondary | ICD-10-CM | POA: Diagnosis not present

## 2023-01-11 DIAGNOSIS — K5900 Constipation, unspecified: Secondary | ICD-10-CM | POA: Diagnosis present

## 2023-01-11 DIAGNOSIS — B961 Klebsiella pneumoniae [K. pneumoniae] as the cause of diseases classified elsewhere: Secondary | ICD-10-CM | POA: Diagnosis present

## 2023-01-11 DIAGNOSIS — I5032 Chronic diastolic (congestive) heart failure: Secondary | ICD-10-CM | POA: Diagnosis present

## 2023-01-11 DIAGNOSIS — G9341 Metabolic encephalopathy: Secondary | ICD-10-CM | POA: Diagnosis present

## 2023-01-11 DIAGNOSIS — Z79899 Other long term (current) drug therapy: Secondary | ICD-10-CM | POA: Diagnosis not present

## 2023-01-11 DIAGNOSIS — G8220 Paraplegia, unspecified: Secondary | ICD-10-CM | POA: Diagnosis present

## 2023-01-11 LAB — URINALYSIS, ROUTINE W REFLEX MICROSCOPIC
Bilirubin Urine: NEGATIVE
Glucose, UA: NEGATIVE mg/dL
Ketones, ur: NEGATIVE mg/dL
Nitrite: NEGATIVE
Protein, ur: 30 mg/dL — AB
Specific Gravity, Urine: 1.013 (ref 1.005–1.030)
Squamous Epithelial / HPF: NONE SEEN /HPF (ref 0–5)
WBC, UA: >50 WBC/hpf (ref 0–5)
pH: 7 (ref 5.0–8.0)

## 2023-01-11 LAB — HIV ANTIBODY (ROUTINE TESTING W REFLEX): HIV Screen 4th Generation wRfx: NONREACTIVE

## 2023-01-11 LAB — GLUCOSE, CAPILLARY
Glucose-Capillary: 124 mg/dL — ABNORMAL HIGH (ref 70–99)
Glucose-Capillary: 99 mg/dL (ref 70–99)

## 2023-01-11 LAB — PROCALCITONIN: Procalcitonin: <0.1 ng/mL

## 2023-01-11 LAB — RESP PANEL BY RT-PCR (RSV, FLU A&B, COVID)  RVPGX2
Influenza A by PCR: NEGATIVE
Influenza B by PCR: NEGATIVE
Resp Syncytial Virus by PCR: NEGATIVE
SARS Coronavirus 2 by RT PCR: NEGATIVE

## 2023-01-11 LAB — TROPONIN I (HIGH SENSITIVITY): Troponin I (High Sensitivity): 3 ng/L (ref <18)

## 2023-01-11 LAB — BRAIN NATRIURETIC PEPTIDE: B Natriuretic Peptide: 97.6 pg/mL (ref 0.0–100.0)

## 2023-01-11 MED ORDER — INSULIN ASPART 100 UNIT/ML IJ SOLN
0.0000 [IU] | Freq: Three times a day (TID) | INTRAMUSCULAR | Status: DC
  Administered 2023-01-11: 1 [IU] via SUBCUTANEOUS
  Filled 2023-01-11: qty 1

## 2023-01-11 MED ORDER — AMANTADINE HCL 100 MG PO CAPS
200.0000 mg | ORAL_CAPSULE | Freq: Two times a day (BID) | ORAL | Status: DC
  Administered 2023-01-11 – 2023-01-14 (×7): 200 mg via ORAL
  Filled 2023-01-11 (×9): qty 2

## 2023-01-11 MED ORDER — VITAMIN D 25 MCG (1000 UNIT) PO TABS
1000.0000 [IU] | ORAL_TABLET | Freq: Every day | ORAL | Status: DC
  Administered 2023-01-11 – 2023-01-14 (×4): 1000 [IU] via ORAL
  Filled 2023-01-11 (×4): qty 1

## 2023-01-11 MED ORDER — GABAPENTIN 400 MG PO CAPS
800.0000 mg | ORAL_CAPSULE | Freq: Every day | ORAL | Status: DC
  Administered 2023-01-11 – 2023-01-13 (×3): 800 mg via ORAL
  Filled 2023-01-11 (×3): qty 2

## 2023-01-11 MED ORDER — CLOTRIMAZOLE 10 MG MT TROC
10.0000 mg | Freq: Every day | OROMUCOSAL | Status: DC
  Administered 2023-01-11 – 2023-01-14 (×14): 10 mg via ORAL
  Filled 2023-01-11 (×19): qty 1

## 2023-01-11 MED ORDER — VANCOMYCIN HCL IN DEXTROSE 1-5 GM/200ML-% IV SOLN
1000.0000 mg | Freq: Once | INTRAVENOUS | Status: AC
  Administered 2023-01-11: 1000 mg via INTRAVENOUS
  Filled 2023-01-11: qty 200

## 2023-01-11 MED ORDER — INSULIN ASPART 100 UNIT/ML IJ SOLN: 0.0000 [IU] | Freq: Every day | INTRAMUSCULAR | Status: DC

## 2023-01-11 MED ORDER — SODIUM CHLORIDE 0.9 % IV SOLN
2.0000 g | Freq: Once | INTRAVENOUS | Status: AC
  Administered 2023-01-11: 2 g via INTRAVENOUS
  Filled 2023-01-11: qty 12.5

## 2023-01-11 MED ORDER — SENNOSIDES-DOCUSATE SODIUM 8.6-50 MG PO TABS
1.0000 | ORAL_TABLET | Freq: Every day | ORAL | Status: DC
  Administered 2023-01-11: 1 via ORAL
  Filled 2023-01-11: qty 1

## 2023-01-11 MED ORDER — SODIUM CHLORIDE 0.9 % IV SOLN
2.0000 g | Freq: Three times a day (TID) | INTRAVENOUS | Status: DC
  Administered 2023-01-11 – 2023-01-14 (×9): 2 g via INTRAVENOUS
  Filled 2023-01-11: qty 2
  Filled 2023-01-11: qty 12.5
  Filled 2023-01-11: qty 2
  Filled 2023-01-11: qty 12.5
  Filled 2023-01-11: qty 2
  Filled 2023-01-11 (×2): qty 12.5
  Filled 2023-01-11: qty 2
  Filled 2023-01-11 (×2): qty 12.5
  Filled 2023-01-11: qty 2

## 2023-01-11 MED ORDER — CRANBERRY 400 MG PO CAPS: 400.0000 mg | ORAL_CAPSULE | Freq: Every day | ORAL | Status: DC

## 2023-01-11 MED ORDER — SODIUM CHLORIDE 0.9 % IV BOLUS (SEPSIS)
1000.0000 mL | Freq: Once | INTRAVENOUS | Status: AC
  Administered 2023-01-11: 1000 mL via INTRAVENOUS

## 2023-01-11 MED ORDER — GABAPENTIN 100 MG PO CAPS
200.0000 mg | ORAL_CAPSULE | Freq: Every morning | ORAL | Status: DC
  Administered 2023-01-11 – 2023-01-14 (×4): 200 mg via ORAL
  Filled 2023-01-11 (×4): qty 2

## 2023-01-11 MED ORDER — METRONIDAZOLE 500 MG/100ML IV SOLN
500.0000 mg | Freq: Once | INTRAVENOUS | Status: AC
  Administered 2023-01-11: 500 mg via INTRAVENOUS
  Filled 2023-01-11: qty 100

## 2023-01-11 MED ORDER — BACLOFEN 10 MG PO TABS
40.0000 mg | ORAL_TABLET | Freq: Two times a day (BID) | ORAL | Status: DC
  Administered 2023-01-11 – 2023-01-14 (×7): 40 mg via ORAL
  Filled 2023-01-11 (×7): qty 4

## 2023-01-11 MED ORDER — ACETAMINOPHEN 650 MG RE SUPP: 650.0000 mg | Freq: Four times a day (QID) | RECTAL | Status: DC | PRN

## 2023-01-11 MED ORDER — GADOBUTROL 1 MMOL/ML IV SOLN
10.0000 mL | Freq: Once | INTRAVENOUS | Status: AC | PRN
  Administered 2023-01-11: 10 mL via INTRAVENOUS

## 2023-01-11 MED ORDER — OXYCODONE-ACETAMINOPHEN 5-325 MG PO TABS
1.0000 | ORAL_TABLET | ORAL | Status: DC | PRN
  Administered 2023-01-11 – 2023-01-12 (×2): 1 via ORAL
  Filled 2023-01-11 (×2): qty 1

## 2023-01-11 MED ORDER — ACETAMINOPHEN 325 MG PO TABS
650.0000 mg | ORAL_TABLET | Freq: Four times a day (QID) | ORAL | Status: DC | PRN
  Administered 2023-01-13 – 2023-01-14 (×2): 650 mg via ORAL
  Filled 2023-01-11 (×2): qty 2

## 2023-01-11 MED ORDER — SODIUM CHLORIDE 0.9 % IV SOLN: INTRAVENOUS | Status: DC

## 2023-01-11 MED ORDER — DULOXETINE HCL 30 MG PO CPEP
60.0000 mg | ORAL_CAPSULE | Freq: Every day | ORAL | Status: DC
  Administered 2023-01-11 – 2023-01-14 (×4): 60 mg via ORAL
  Filled 2023-01-11 (×4): qty 2

## 2023-01-11 MED ORDER — ENOXAPARIN SODIUM 60 MG/0.6ML IJ SOSY
0.5000 mg/kg | PREFILLED_SYRINGE | INTRAMUSCULAR | Status: DC
  Administered 2023-01-11 – 2023-01-13 (×3): 50 mg via SUBCUTANEOUS
  Filled 2023-01-11 (×3): qty 0.6

## 2023-01-11 MED ORDER — ONDANSETRON HCL 4 MG/2ML IJ SOLN
4.0000 mg | Freq: Three times a day (TID) | INTRAMUSCULAR | Status: DC | PRN
  Administered 2023-01-13: 4 mg via INTRAVENOUS
  Filled 2023-01-11: qty 2

## 2023-01-11 NOTE — ED Notes (Signed)
Urinary collection bag changed with peritoneal care provided.

## 2023-01-11 NOTE — Consult Note (Signed)
Pharmacy Antibiotic Note  [name] is a 52 y.o. female admitted on 01/11/2023 with UTI.  Pharmacy has been consulted for Cefepime dosing.  Plan: Cefepime 2gm q 8hrs  Height: 5\' 4"  (162.6 cm) Weight: 99.3 kg (218 lb 14.7 oz) IBW/kg (Calculated) : 54.7  Temp (24hrs), Avg:98.9 F (37.2 C), Min:98.2 F (36.8 C), Max:99.5 F (37.5 C)  Recent Labs  Lab 01/10/23 2101  WBC 13.2*  CREATININE 0.46  LATICACIDVEN 0.9    Estimated Creatinine Clearance: 93.1 mL/min (by C-G formula based on SCr of 0.46 mg/dL).    Allergies  Allergen Reactions   Latex Swelling and Other (See Comments)    Reaction:  Facial swelling     Antimicrobials this admission: 4/17 Cefepime >>  4/17 Vancomycin x1 4/17 Metronidazole  Dose adjustments this admission: n/a  Microbiology results: 4/17 BCx: collected 4/17 UCx: collected    Thank you for allowing pharmacy to be a part of this patient's care.   Rodriguez-Guzman PharmD, BCPS 01/11/2023 11:13 AM

## 2023-01-11 NOTE — Consult Note (Signed)
Neurology Consultation Reason for Consult: Arm weakness Referring Physician: Jarvis Morgan  CC: Arm weakness  History is obtained from:Patient  HPI: [name] is a 52 y.o. 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  She states that she also has been having worsening weakness of her left arm as well as legs.  She typically can move her legs a little bit, but is unable to at all currently.  She also is able to use her left arm some, though always has some spasticity.  She was evaluated in the emergency department found to have a UTI, and is being started on treatment for this, but due to the new neurological symptoms neurology has been consulted.     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 She has never used smokeless tobacco. She reports current alcohol use. She reports that she does not use drugs.   Exam: Current vital signs: BP (!) 131/45 (BP Location: Left Arm)   Pulse 75   Temp 98.2 F (36.8 C) (Oral)   Resp 15   Ht  (1.626 m)   Wt 99.3 kg   SpO2 100%   BMI 37.58 kg/m  Vital signs in last 24 hours: Temp:  [98.2 F (36.8 C)-99.5 F (37.5 C)] 98.2 F (36.8 C) (04/17 0803) Pulse Rate:  [74-104] 75 (04/17 1306) Resp:  [15-20] 15 (04/17 1306) BP: (125-146)/(45-130) 131/45 (04/17 1306) SpO2:  [95 %-100 %] 100  % (04/17 1306) Weight:  [99.3 kg] 99.3 kg (04/17 0720)   Physical Exam  Appears well-developed and well-nourished.   Neuro: Mental Status: Patient is awake, alert, oriented to person, place, month, year, and situation. Patient is able to give a clear and coherent history. No signs of aphasia or neglect Cranial Nerves: II: Visual Fields are full. Pupils are equal, round, and reactive to light.   III,IV, VI: She has a left exotropia V: Facial sensation is symmetric to temperature VII: Facial movement with?  Decreased nasolabial fold on the right VIII: hearing is intact to voice X: Uvula elevates symmetrically XI: Shoulder shrug is symmetric. XII: tongue is midline without atrophy or fasciculations.  Motor: She has increased tone throughout all extremities, she has very little movement in her left arm, is able to flex her fingers very slightly, her left arm is extremely spastic, she is unable to lift her right arm against gravity of the shoulder, but is able to grip with at least 4 out of 5 strength in the right.  No movement in lower extremities Sensory: Sensation is diminished in bilateral lower extremities Plantars: Toes are upgoing bilaterally.  Cerebellar: She does not perform   I have reviewed labs in epic and the results pertinent to this consultation are: Urinalysis with large leukocytes Leukocytosis with WBC of 13.2  Normal CMP Normal procalcitonin  I have reviewed the images obtained: CT head - no acute intracranial findings  Impression: 52 year old female with primary progressive MS with worsening neurological symptoms in the setting of urinary tract infection.  Primary progressive MS is not typically associated with "flares" per se, but given her symptoms, I do think that repeating imaging is prudent.  It is possible that this represents unmasking of previous symptoms by her physiological stressor, but if there is enhancement on MRI then I would favor considering  steroids as well.  Recommendations: 1) MRI brain and cervical spine with and without contrast 2) neurology will follow   Ritta Slot, MD [facility] [phone]  If 7pm- 7am, please page neurology on call as listed in [facility]ION.

## 2023-01-11 NOTE — ED Notes (Signed)
Pt cleaned up of BM. Linens changed. Clean brief and chux placed under patient. Tolerated well.

## 2023-01-11 NOTE — Progress Notes (Signed)
PHARMACIST - PHYSICIAN COMMUNICATION  CONCERNING:  Enoxaparin (Lovenox) for DVT Prophylaxis    RECOMMENDATION: Patient was prescribed enoxaprin  q24 hours for VTE prophylaxis.   Filed Weights   01/11/23 0720  Weight: 99.3 kg (218 lb 14.7 oz)    Body mass index is 37.58 kg/m.  Estimated Creatinine Clearance: 93.1 mL/min (by C-G formula based on SCr of 0.46 mg/dL).   Based on [facility] policy patient is candidate for enoxaparin 0.5mg /kg TBW SQ every 24 hours based on BMI being >30.  DESCRIPTION: Pharmacy has adjusted enoxaparin dose per [facility] policy.  Patient is now receiving enoxaparin 50 mg every 24 hours    Barrie Folk, PharmD Clinical Pharmacist  01/11/2023 9:07 AM

## 2023-01-11 NOTE — ED Provider Notes (Signed)
[facility] Provider Note    Event Date/Time   First MD Initiated Contact with Patient 01/11/23 0401     (approximate)  History   Chief Complaint: Extremity Weakness and Abdominal Pain  HPI  [name] is a 52 y.o. female with a past medical history of multiple sclerosis, neurogenic bowel, urostomy, paraplegia who presents emergency department for altered mental status.  According to report patient arrives via EMS from home was seen at urgent care yesterday and diagnosed with constipation.  Today patient presents with a temperature 101 per EMS initially hypotensive although normotensive upon arrival to the emergency department.  Reported intermittent left arm symptoms but unclear of the timeline.  Patient has a history of MS normally is alert and oriented x 4.  Per triage nurse alert and oriented x 4 upon arrival.  During my evaluation patient deftly seems confused.  Unable to provide a reliable history although denies any chest pain abdominal pain cough or congestion.  Patient has had borderline temperature here 99.5.  Physical Exam   Triage Vital Signs: ED Triage Vitals  Enc Vitals Group     BP 01/10/23 2054 134/69     Pulse Rate 01/10/23 2054 (!) 104     Resp 01/10/23 2054 19     Temp 01/10/23 2054 99.5 F (37.5 C)     Temp Source 01/10/23 2054 Oral     SpO2 01/10/23 2044 95 %     Weight --      Height --      Head Circumference --      Peak Flow --      Pain Score --      Pain Loc --      Pain Edu? --      Excl. in GC? --     Most recent vital signs: Vitals:   01/11/23 0146 01/11/23 0339  BP: (!) 146/130 (!) 125/55  Pulse: 74 88  Resp: 20 15  Temp: 99.2 F (37.3 C) 98.8 F (37.1 C)  SpO2: 100% 100%    General: Awake, no distress.  Confusion. CV:  Good peripheral perfusion.  Regular rate and rhythm  Resp:  Normal effort.  Equal breath sounds bilaterally.  Abd:  No distention.  Soft, nontender.  No rebound or guarding.  Urostomy  present.  ED Results / Procedures / Treatments   EKG  EKG viewed and interpreted by myself shows sinus tachycardia 102 bpm with a narrow QRS, normal axis, normal intervals, no concerning ST changes.  RADIOLOGY  I have reviewed and interpreted chest x-ray images.  No obvious consolidation seen on my evaluation. Radiology has read the x-ray as low lung volumes with bibasilar atelectasis.   MEDICATIONS ORDERED IN ED: Medications  sodium chloride 0.9 % bolus 1,000 mL (has no administration in time range)  ceFEPIme (MAXIPIME) 2 g in sodium chloride 0.9 % 100 mL IVPB (has no administration in time range)  metroNIDAZOLE (FLAGYL) IVPB 500 mg (has no administration in time range)  vancomycin (VANCOCIN) IVPB 1000 mg/200 mL premix (has no administration in time range)     IMPRESSION / MDM / ASSESSMENT AND PLAN / ED COURSE  I reviewed the triage vital signs and the nursing notes.  Patient's presentation is most consistent with acute presentation with potential threat to life or bodily function.  Patient presents emergency department for altered mental status.  Initially alert and oriented x 4 upon arrival however during my evaluation patient seems confused.  When asked  why she is here she states they need to sit up.  When asked where she is she began responding with her Social Security number.  Per EMS had a 101 fever orally, low-grade 99.5 in the emergency department but initial tachycardia.  Patient's workup shows a reassuring head CT.  Troponin is negative lactate is normal, chemistry is normal patient CBC does show mild leukocytosis of 13,000.  Given the patient's confusion with reported oral temperature to 101 initial tachycardia, hypotension per EMS and white blood cell count of 13,000 we will begin broad-spectrum antibiotics as the patient meets sepsis criteria.  Highly suspect sepsis to be the cause of the patient's confusion.  Urinalysis pending however this is from a urostomy sample.  Will  also obtain a chest x-ray as well as a COVID and flu swab.  Chest x-ray shows low lung volumes with atelectasis but no consolidation.  COVID flu and RSV are negative.  Patient's urinalysis shows greater than 50 white cells with white blood cell clumps.  Although this is a urostomy sample given the patient's reported fever at home initial tachycardia and leukocytosis meeting sepsis criteria we will treat with broad-spectrum antibiotics we will send a urine culture we will admit to the hospital service for further workup and treatment.  CRITICAL CARE Performed by: Minna Antis   Total critical care time: 30 minutes  Critical care time was exclusive of separately billable procedures and treating other patients.  Critical care was necessary to treat or prevent imminent or life-threatening deterioration.  Critical care was time spent personally by me on the following activities: development of treatment plan with patient and/or surrogate as well as nursing, discussions with consultants, evaluation of patient's response to treatment, examination of patient, obtaining history from patient or surrogate, ordering and performing treatments and interventions, ordering and review of laboratory studies, ordering and review of radiographic studies, pulse oximetry and re-evaluation of patient's condition.   FINAL CLINICAL IMPRESSION(S) / ED DIAGNOSES   Sepsis Urinary tract infection Confusion   Note:  This document was prepared using Dragon voice recognition software and may include unintentional dictation errors.   Minna Antis, MD 01/11/23 [phone]

## 2023-01-11 NOTE — H&P (Signed)
History and Physical    [name]:[id number] DOB: [birth year] DOA: 01/11/2023  Referring MD/NP/PA:   PCP: [name], MD   Patient coming from:  The patient is coming from home.   Chief Complaint: AMS, left arm weakness, abdominal pain  HPI: [name] is a 52 y.o. 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   Per her brother at beside, due to constipation and abdominal pain, pt was seen in [facility] yesterday. She had CT-abd/pelvis yesterday on 4/16 and was diagnosed as constipation. Pt was started on sennokot with improvement of constipation.  Patient has nausea, no vomiting or diarrhea.  She continues to have some central abdominal pain.  Since patient is confused, she cannot further characterize her abdominal pain.  Her brother states that since last night, patient developed fever and chills.  Temperature was 101.5 at home, 99.5 JVD.  She is also noted to be confused.  At her normal baseline, patient is oriented x 3, but when I saw patient in ED, patient is alert, oriented to person and place, not to year 2024.  She is normally able to use both arms, but now she has left arm weakness.  She can not use left arm currently.  Patient denies chest pain, cough, shortness breath.  CT abdomen/pelvis at [facility] on 01/10/2023 1.Right lower quadrant ostomy with large parastomal hernia containing a nondilated small bowel loop. The small bowel leading into the hernia is mildly dilated without significant small bowel dilatation upstream to the hernia sac.  2.Cystectomy and ileal conduit formation with out obstructive uropathy. Similar mild distention of the ileal pouch formation, fluid-filled otherwise similar compared to prior  3.Large colonic fecal burden.   Data reviewed independently and ED Course: pt was found to  have WBC 13.2, lactic acid 0.9, positive urinalysis (cloudy appearance, large amount of leukocyte, rare bacteria, WBC> 50), troponin negative x 2, negative PCR for COVID, flu and RSV, GFR> 60, blood pressure 130/64, heart rate 104, RR 20, oxygen saturation 100% on room air. CT-head negative.  Chest x-ray showed low lung volume, bilateral basilar subsegmental atelectasis.  Patient is admitted to telemetry bed as inpatient.  Dr. Amada Jupiter of neurology is consulted.   EKG: I have personally reviewed.  Sinus rhythm, QTc 466, low voltage, no ischemic change.   Review of Systems:   General: has fevers, chills, no body weight gain, has poor appetite, has fatigue HEENT: no blurry vision, hearing changes or sore throat Respiratory: no dyspnea, coughing, wheezing CV: no chest pain, no palpitations GI: has nausea, abdominal pain, no diarrhea, constipation, vomiting, GU: no dysuria, burning on urination, increased urinary frequency, hematuria  Ext: has trace leg edema Neuro: no vision change or hearing loss. Has confusion.  Has paraplegia.  Has left arm weakness Skin: no rash, no skin tear. MSK: No muscle spasm, no deformity, no limitation of range of movement in spin Heme: No easy bruising.  Travel history: No recent long distant travel.   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 She has never used smokeless tobacco. She reports current alcohol use. She reports that she does not use drugs.  Family History:  Family History  Problem Relation Age of Onset   Osteoarthritis Mother    Hypertension Mother    Hyperlipidemia Mother    Thyroid disease Mother    Liver cancer Father    Asthma Brother      Prior to Admission medications   Medication Sig Start Date End Date Taking? Authorizing Provider  acetaminophen (TYLENOL) 500 MG tablet Take 500 mg by mouth every 6 (six) hours as needed.    [provider]  amantadine (SYMMETREL) 100 MG capsule Take 100 mg by mouth 2 (two) times daily.    [provider]  baclofen (LIORESAL) 20 MG tablet Take 20 mg by mouth 2 (two) times daily.     [provider]  benzonatate (TESSALON) 200 MG capsule Take 1 capsule (200 mg total) by mouth 3 (three) times daily. 12/06/21   Hongalgi, Maximino Greenland, MD  cholecalciferol (VITAMIN D) 1000 units tablet Take 1,000 Units by mouth daily.    [provider]  Cranberry 400 MG CAPS Take 400 mg by mouth daily.    [provider]  DULoxetine (CYMBALTA) 30 MG capsule Take 30 mg by mouth 2 (two) times daily.    [provider]  gabapentin (NEURONTIN) 100 MG capsule Take 200 mg by mouth every morning. 06/17/21   [provider]  gabapentin (NEURONTIN) 800 MG tablet Take 800 mg by mouth at bedtime. 07/08/21   [provider]  guaiFENesin-dextromethorphan (ROBITUSSIN DM) 100-10 MG/5ML syrup Take 5 mLs by mouth every 4 (four) hours as needed for cough. 12/06/21   Hongalgi, Maximino Greenland, MD  OZEMPIC, 1 MG/DOSE, 4 MG/3ML SOPN Inject 1 mg into the skin once a week. On Monday 09/02/21   [provider]  STIMULANT LAXATIVE 8.6-50 MG tablet Take 1 tablet by mouth at bedtime. 11/07/21   [provider]    Physical Exam: Vitals:   01/11/23 0339 01/11/23 0720 01/11/23 0803 01/11/23 1306  BP: (!) 125/55  130/64 (!) 131/45  Pulse: 88   84 75  Resp: 15   15  Temp: 98.8 F (37.1 C)  98.2 F (36.8 C)   TempSrc: Oral  Oral   SpO2: 100%  100% 100%  Weight:  99.3 kg    Height:   (1.626 m)     General: Not in acute distress HEENT:       Eyes: PERRL, EOMI, no scleral icterus.       ENT: No discharge from the ears and nose, no pharynx injection, no tonsillar enlargement.        Neck: No JVD, no bruit, no mass felt. Heme: No neck lymph node enlargement. Cardiac: S1/S2, RRR, No murmurs, No gallops or rubs. Respiratory: No rales, wheezing, rhonchi or rubs. GI: Mildly distended, has central  tenderness, no organomegaly, BS present.  S/p of urostomy bag in the right lower quadrant GU: No hematuria Ext: has trace leg edema bilaterally. 1+DP/PT pulse bilaterally. Musculoskeletal: No joint deformities, No joint redness or warmth, no limitation of ROM in spin. Skin: No rashes.  Neuro: Alert, mildly confused, oriented to person and place, not to year 2024.  Cranial nerves II-XII grossly intact.  Has paraplegia.  Has left arm weakness with muscle strength of 2/5.  Psych: Patient is not psychotic, no suicidal or hemocidal ideation.  Labs on Admission: I have personally reviewed following labs and imaging studies  CBC: Recent Labs  Lab 01/10/23 2101  WBC 13.2*  NEUTROABS 8.9*  HGB 12.5  HCT 37.6  MCV 90.6  PLT 303   Basic Metabolic Panel: Recent Labs  Lab 01/10/23 2101  NA 137  K 3.6  CL 103  CO2 23  GLUCOSE 99  BUN 9  CREATININE 0.46  CALCIUM 9.1   GFR: Estimated Creatinine Clearance: 93.1 mL/min (by C-G formula based on SCr of 0.46 mg/dL). Liver Function Tests: Recent Labs  Lab 01/10/23 2101  AST 17  ALT 13  ALKPHOS 44  BILITOT 1.2  PROT 7.7  ALBUMIN 3.9   No results for input(s): "LIPASE", "AMYLASE" in the last 168 hours. No results for input(s): "AMMONIA" in the last 168 hours. Coagulation Profile: No results for input(s): "INR", "PROTIME" in the last 168 hours. Cardiac Enzymes: No results  for input(s): "CKTOTAL", "CKMB", "CKMBINDEX", "TROPONINI" in the last 168 hours. BNP (last 3 results) No results for input(s): "PROBNP" in the last 8760 hours. HbA1C: No results for input(s): "HGBA1C" in the last 72 hours. CBG: No results for input(s): "GLUCAP" in the last 168 hours. Lipid Profile: No results for input(s): "CHOL", "HDL", "LDLCALC", "TRIG", "CHOLHDL", "LDLDIRECT" in the last 72 hours. Thyroid Function Tests: No results for input(s): "TSH", "T4TOTAL", "FREET4", "T3FREE", "THYROIDAB" in the last 72 hours. Anemia Panel: No results for input(s): "VITAMINB12", "FOLATE", "FERRITIN", "TIBC", "IRON", "RETICCTPCT" in the last 72 hours. Urine analysis:    Component Value Date/Time   COLORURINE YELLOW (A) 01/11/2023 0456   APPEARANCEUR CLOUDY (A) 01/11/2023 0456   APPEARANCEUR Cloudy 10/05/2014 2100   LABSPEC 1.013 01/11/2023 0456   LABSPEC 1.009 10/05/2014 2100   PHURINE 7.0 01/11/2023 0456   GLUCOSEU NEGATIVE 01/11/2023 0456   GLUCOSEU Negative 10/05/2014 2100   HGBUR SMALL (A) 01/11/2023 0456   BILIRUBINUR NEGATIVE 01/11/2023 0456   BILIRUBINUR Negative 10/05/2014 2100   KETONESUR NEGATIVE 01/11/2023 0456   PROTEINUR 30 (A) 01/11/2023 0456   NITRITE NEGATIVE 01/11/2023 0456   LEUKOCYTESUR LARGE (A) 01/11/2023 0456   LEUKOCYTESUR 3+ 10/05/2014 2100   Sepsis Labs: @LABRCNTIP (procalcitonin:4,lacticidven:4) ) Recent Results (from the past 240 hour(s))  Blood culture (routine x 2)     Status: None (Preliminary result)   Collection Time: 01/11/23  3:15 AM   Specimen: BLOOD RIGHT ARM  Result Value Ref Range Status   Specimen Description BLOOD RIGHT ARM  Final   Special Requests   Final    BOTTLES DRAWN AEROBIC ONLY Blood Culture results may not be optimal due to an inadequate volume of blood received in culture bottles   Culture   Final    NO GROWTH < 12 HOURS Performed at [facility], [address] [place], [place] [address]    Report Status PENDING   Incomplete  Blood culture (routine x 2)     Status: None (Preliminary result)   Collection Time: 01/11/23  3:15 AM   Specimen: BLOOD LEFT HAND  Result Value Ref Range Status   Specimen Description BLOOD LEFT HAND  Final   Special Requests   Final    BOTTLES DRAWN AEROBIC ONLY Blood Culture results may not be optimal due to an inadequate  volume of blood received in culture bottles   Culture   Final    NO GROWTH < 12 HOURS Performed at [facility], [address] [place], [place] [address]    Report Status PENDING  Incomplete  Resp panel by RT-PCR (RSV, Flu A&B, Covid) Anterior Nasal Swab     Status: None   Collection Time: 01/11/23  4:10 AM   Specimen: Anterior Nasal Swab  Result Value Ref Range Status   SARS Coronavirus 2 by RT PCR NEGATIVE NEGATIVE Final    Comment: (NOTE) SARS-CoV-2 target nucleic acids are NOT DETECTED.  The SARS-CoV-2 RNA is generally detectable in upper respiratory specimens during the acute phase of infection. The lowest concentration of SARS-CoV-2 viral copies this assay can detect is 138 copies/mL. A negative result does not preclude SARS-Cov-2 infection and should not be used as the sole basis for treatment or other patient management decisions. A negative result may occur with  improper specimen collection/handling, submission of specimen other than nasopharyngeal swab, presence of viral mutation(s) within the areas targeted by this assay, and inadequate number of viral copies(<138 copies/mL). A negative result must be combined with clinical observations, patient history, and epidemiological information. The expected result is Negative.  Fact Sheet for Patients:  BloggerCourse.com  Fact Sheet for Healthcare Providers:  SeriousBroker.it  This test is no t yet approved or cleared by the [place] FDA and  has been authorized for detection and/or diagnosis of SARS-CoV-2 by FDA under an  Emergency Use Authorization (EUA). This EUA will remain  in effect (meaning this test can be used) for the duration of the COVID-19 declaration under Section 564(b)(1) of the Act, 21 U.S.C.section 360bbb-3(b)(1), unless the authorization is terminated  or revoked sooner.       Influenza A by PCR NEGATIVE NEGATIVE Final   Influenza B by PCR NEGATIVE NEGATIVE Final    Comment: (NOTE) The Xpert Xpress SARS-CoV-2/FLU/RSV plus assay is intended as an aid in the diagnosis of influenza from Nasopharyngeal swab specimens and should not be used as a sole basis for treatment. Nasal washings and aspirates are unacceptable for Xpert Xpress SARS-CoV-2/FLU/RSV testing.  Fact Sheet for Patients: BloggerCourse.com  Fact Sheet for Healthcare Providers: SeriousBroker.it  This test is not yet approved or cleared by the [place] FDA and has been authorized for detection and/or diagnosis of SARS-CoV-2 by FDA under an Emergency Use Authorization (EUA). This EUA will remain in effect (meaning this test can be used) for the duration of the COVID-19 declaration under Section 564(b)(1) of the Act, 21 U.S.C. section 360bbb-3(b)(1), unless the authorization is terminated or revoked.     Resp Syncytial Virus by PCR NEGATIVE NEGATIVE Final    Comment: (NOTE) Fact Sheet for Patients: BloggerCourse.com  Fact Sheet for Healthcare Providers: SeriousBroker.it  This test is not yet approved or cleared by the [place] FDA and has been authorized for detection and/or diagnosis of SARS-CoV-2 by FDA under an Emergency Use Authorization (EUA). This EUA will remain in effect (meaning this test can be used) for the duration of the COVID-19 declaration under Section 564(b)(1) of the Act, 21 U.S.C. section 360bbb-3(b)(1), unless the authorization is terminated or revoked.  Performed at [facility], [address] [place], [place] [address]      Radiological Exams on Admission: DG Chest Latty 1 View  Result Date: 01/11/2023 CLINICAL DATA:  52 year old female with possible sepsis. EXAM: PORTABLE CHEST 1 VIEW COMPARISON:  Chest x-ray 12/03/2021. FINDINGS: Lung volumes are  exceedingly low. Bibasilar opacities likely reflect areas of atelectasis. No definite pleural effusions. No pneumothorax. No evidence of pulmonary edema. Heart size is normal. Upper mediastinal contours are within normal limits allowing for low lung volumes and patient positioning. IMPRESSION: 1. Low lung volumes with probable bibasilar subsegmental atelectasis. Electronically Signed   By: Trudie Reed M.D.   On: 01/11/2023 05:40   CT Head Wo Contrast  Result Date: 01/11/2023 CLINICAL DATA:  Left upper extremity weakness, AMS EXAM: CT HEAD WITHOUT CONTRAST TECHNIQUE: Contiguous axial images were obtained from the base of the skull through the vertex without intravenous contrast. RADIATION DOSE REDUCTION: This exam was performed according to the departmental dose-optimization program which includes automated exposure control, adjustment of the mA and/or kV according to patient size and/or use of iterative reconstruction technique. COMPARISON:  04/10/2017 FINDINGS: Brain: No evidence of acute infarction, hemorrhage, hydrocephalus, extra-axial collection or mass lesion/mass effect. Vascular: No hyperdense vessel or unexpected calcification. Skull: Normal. Negative for fracture or focal lesion. Sinuses/Orbits: The visualized paranasal sinuses are essentially clear. The mastoid air cells are unopacified. Other: None. IMPRESSION: Normal head CT. Electronically Signed   By: Charline Bills M.D.   On: 01/11/2023 02:54      Assessment/Plan Principal Problem:   Complicated UTI (urinary tract infection) Active Problems:   Sepsis   Acute metabolic encephalopathy   Multiple sclerosis   Weakness of upper extremity    Depression   Chronic diastolic CHF (congestive heart failure)   Constipation   Obesity (BMI 30-39.9)   Assessment and Plan:  Sepsis due to complicated UTI (urinary tract infection): Patient has history of positive urine culture for Pseudomonas.  Patient meets criteria for sepsis with WBC 13.2 and heart rate up to 104.  Lactic acid is normal 0.9, procalcitonin < 0.10.  Patient has altered mental status.  -Admitted to telemetry bed as inpatient -Cefepime (patient received 1 dose of vancomycin and Flagyl in ED) -Follow-up of blood culture urine culture -IV fluid: 1 L normal saline, then 75 cc/h  Acute metabolic encephalopathy: Possibly due to UTI.  CT head negative. -Frequent neurocheck, -Fall precaution  Multiple sclerosis and weakness of upper extremity_left arm: consulted Dr. Amada Jupiter of neurology -MRI of brain and C-spine with and without contrast per Dr. Amada Jupiter. -Continue home amantadine, baclofen, Neurontin  Depression -Continue home medications  Chronic diastolic CHF (congestive heart failure): Patient has trace leg edema.  BNP is normal 97.6.  CHF seem to be compensated.  2D echo on 09/03/2021 showed EF of 60 to 65% with grade 1 diastolic dysfunction. -Watch volume status closely  Constipation -Senokot  Obesity (BMI 30-39.9): Body weight 99.7 kg, BMI 37.58. -Healthy diet -Encourage exercise  -Encourage losing weight   DVT ppx:  SQ Lovenox  Code Status: Full code  Family Communication:  Yes, patient's brother   at bed side.   Disposition Plan:  Anticipate discharge back to previous environment  Consults called: Dr. Amada Jupiter of neurology  Admission status and Level of care: Telemetry Medical:  as inpt     Dispo: The patient is from: Home              Anticipated d/c is to: Home              Anticipated d/c date is: 2 days              Patient currently is not medically stable to d/c.    Severity of Illness:  The appropriate patient status for  this patient is  INPATIENT. Inpatient status is judged to be reasonable and necessary in order to provide the required intensity of service to ensure the patient's safety. The patient's presenting symptoms, physical exam findings, and initial radiographic and laboratory data in the context of their chronic comorbidities is felt to place them at high risk for further clinical deterioration. Furthermore, it is not anticipated that the patient will be medically stable for discharge from the hospital within 2 midnights of admission.   * I certify that at the point of admission it is my clinical judgment that the patient will require inpatient hospital care spanning beyond 2 midnights from the point of admission due to high intensity of service, high risk for further deterioration and high frequency of surveillance required.*       Date of Service 01/11/2023    Lorretta Harp:  [facility]   If 7PM-7AM, please contact night-coverage [email or web address] 01/11/2023, 1:10 PM

## 2023-01-11 NOTE — Sepsis Progress Note (Signed)
Following for sepsis monitoring ?

## 2023-01-11 NOTE — ED Notes (Signed)
Lab called again for venipuncture assist.

## 2023-01-11 NOTE — Progress Notes (Signed)
CODE SEPSIS - PHARMACY COMMUNICATION  **Broad Spectrum Antibiotics should be administered within 1 hour of Sepsis diagnosis**  Time Code Sepsis Called/Page Received: 0500   Antibiotics Ordered: Cefepime, Flagyl,Vancomycin  Time of 1st antibiotic administration: 0600  Otelia Sergeant, PharmD, [facility] 01/11/2023 5:09 AM

## 2023-01-12 DIAGNOSIS — N390 Urinary tract infection, site not specified: Secondary | ICD-10-CM | POA: Diagnosis not present

## 2023-01-12 DIAGNOSIS — G35 Multiple sclerosis: Secondary | ICD-10-CM | POA: Diagnosis not present

## 2023-01-12 LAB — CBC
HCT: 35.5 % — ABNORMAL LOW (ref 36.0–46.0)
Hemoglobin: 11.7 g/dL — ABNORMAL LOW (ref 12.0–15.0)
MCH: 30.2 pg (ref 26.0–34.0)
MCHC: 33 g/dL (ref 30.0–36.0)
MCV: 91.5 fL (ref 80.0–100.0)
Platelets: 244 10*3/uL (ref 150–400)
RBC: 3.88 MIL/uL (ref 3.87–5.11)
RDW: 14.3 % (ref 11.5–15.5)
WBC: 12.1 10*3/uL — ABNORMAL HIGH (ref 4.0–10.5)
nRBC: 0 % (ref 0.0–0.2)

## 2023-01-12 LAB — BASIC METABOLIC PANEL
Anion gap: 10 (ref 5–15)
BUN: 9 mg/dL (ref 6–20)
CO2: 22 mmol/L (ref 22–32)
Calcium: 8.4 mg/dL — ABNORMAL LOW (ref 8.9–10.3)
Chloride: 107 mmol/L (ref 98–111)
Creatinine, Ser: 0.42 mg/dL — ABNORMAL LOW (ref 0.44–1.00)
GFR, Estimated: >60 mL/min (ref >60)
Glucose, Bld: 89 mg/dL (ref 70–99)
Potassium: 3.2 mmol/L — ABNORMAL LOW (ref 3.5–5.1)
Sodium: 139 mmol/L (ref 135–145)

## 2023-01-12 LAB — URINE CULTURE: Culture: >=100000 — AB

## 2023-01-12 LAB — GLUCOSE, CAPILLARY
Glucose-Capillary: 91 mg/dL (ref 70–99)
Glucose-Capillary: 92 mg/dL (ref 70–99)
Glucose-Capillary: 82 mg/dL (ref 70–99)

## 2023-01-12 MED ORDER — POLYETHYLENE GLYCOL 3350 17 G PO PACK
17.0000 g | PACK | Freq: Two times a day (BID) | ORAL | Status: AC
  Administered 2023-01-12: 17 g via ORAL
  Filled 2023-01-12 (×2): qty 1

## 2023-01-12 MED ORDER — SENNOSIDES-DOCUSATE SODIUM 8.6-50 MG PO TABS
2.0000 | ORAL_TABLET | Freq: Two times a day (BID) | ORAL | Status: AC
  Administered 2023-01-12 (×2): 2 via ORAL
  Filled 2023-01-12 (×2): qty 2

## 2023-01-12 MED ORDER — ORAL CARE MOUTH RINSE
15.0000 mL | OROMUCOSAL | Status: DC
  Administered 2023-01-12 – 2023-01-14 (×8): 15 mL via OROMUCOSAL

## 2023-01-12 MED ORDER — ORAL CARE MOUTH RINSE: 15.0000 mL | OROMUCOSAL | Status: DC | PRN

## 2023-01-12 MED ORDER — POTASSIUM CHLORIDE CRYS ER 20 MEQ PO TBCR
40.0000 meq | EXTENDED_RELEASE_TABLET | Freq: Once | ORAL | Status: AC
  Administered 2023-01-12: 40 meq via ORAL
  Filled 2023-01-12: qty 2

## 2023-01-12 NOTE — Evaluation (Addendum)
Occupational Therapy Evaluation Patient Details Name: [name] MRN: [id number] DOB: 1971/08/29 Today's Date: 01/12/2023   History of Present Illness 52 y.o. 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 Found to have UTI. Pt is bedbound at baseline.   Clinical Impression   "[name]" seen by OT for evaluation this date. She states that at baseline she is bedbound, hoyer lift to power w/c by aide. She was able to feed herself and perform some grooming tasks herself but now requires assist from staff for self feeding 2/2 decreased strength, AROM, and coordination in LUE>RUE. Pt provided with red built up foam handles to improve grasp of utensils. Pt reports she has something similar at home that she uses well. Pt demonstrates decreased strength/coordination/AROM BUE (LUE slightly worse) and may benefit from additional OT services upon discharge to maximize return to PLOF and minimize caregiver burden. No additional acute OT needs, goals can be addressed at next venue of care. Will sign off.      Recommendations for follow up therapy are one component of a multi-disciplinary discharge planning process, led by the attending physician.  Recommendations may be updated based on patient status, additional functional criteria and insurance authorization.   Assistance Recommended at Discharge Intermittent Supervision/Assistance  Patient can return home with the following Two people to help with walking and/or transfers;A lot of help with bathing/dressing/bathroom;Direct supervision/assist for medications management;Direct supervision/assist for financial management;Assistance with cooking/housework;Assist for transportation;Help with stairs or ramp for entrance;Assistance with feeding    Functional Status Assessment  Patient has had a recent  decline in their functional status and demonstrates the ability to make significant improvements in function in a reasonable and predictable amount of time.  Equipment Recommendations  None recommended by OT    Recommendations for Other Services       Precautions / Restrictions Precautions Precaution Comments: paraplegia      Mobility Bed Mobility Overal bed mobility: Needs Assistance             General bed mobility comments: deferred - total assist at baseline    Transfers Overall transfer level: Needs assistance                 General transfer comment: total assist hoyer lift at baseline      Balance                                           ADL either performed or assessed with clinical judgement   ADL                                         General ADL Comments: Pt able to wash her face with set up of washcloth in R hand, difficulty feeding requiring assist from nurse tech because she drops a standard utensil often 2/2 decreased grasp strength. Provided with built up foam handles to improve indep with self feeding.     Vision         Perception     Praxis      Pertinent Vitals/Pain Pain Assessment Pain Assessment: 0-10 Pain Score: 9  Pain Location: stomach and low back Pain Descriptors / Indicators:  Aching Pain Intervention(s): Limited activity within patient's tolerance, Monitored during session, Patient requesting pain meds-RN notified     Hand Dominance Right   Extremity/Trunk Assessment Upper Extremity Assessment Upper Extremity Assessment: Generalized weakness (weak bilaterally, LUE slightly weaker, pt notes usually a little stronger at baseline; limited AROM at baseline, decreased grip bilaterally)   Lower Extremity Assessment Lower Extremity Assessment: Defer to PT evaluation;Generalized weakness       Communication     Cognition Arousal/Alertness: Awake/alert Behavior During Therapy: WFL  for tasks assessed/performed Overall Cognitive Status: No family/caregiver present to determine baseline cognitive functioning                                 General Comments: alert and oriented, demo's some difficulty with processing of information/questions asked requiring additional cues     General Comments       Exercises     Shoulder Instructions      Home Living Family/patient expects to be discharged to:: Private residence Living Arrangements: Parent Available Help at Discharge: Family;Personal care attendant (Aide (M-Sat for ~5 hours, and Sunday Morning ~3 hours)) Type of Home: House Home Access: Ramped entrance     Home Layout: One level               Home Equipment: Hospital bed;Wheelchair - power;Wheelchair - Careers adviser (comment)   Additional Comments: has manual w/c, only uses power w/c; also has a hoyer lift      Prior Functioning/Environment Prior Level of Function : Needs assist             Mobility Comments: total assist, bedbound, non-ambulatory, power w/c at baseline, hoyer lift at baseline ADLs Comments: Able to feed herself, wash her face and brush her teeth, aide assists with all other ADL/IADL        OT Problem List: Decreased strength;Decreased coordination;Decreased range of motion;Impaired UE functional use      OT Treatment/Interventions:      OT Goals(Current goals can be found in the care plan section) Acute Rehab OT Goals Patient Stated Goal: be able to feed myself OT Goal Formulation: All assessment and education complete, DC therapy  OT Frequency:      Co-evaluation              AM-PAC OT "6 Clicks" Daily Activity     Outcome Measure Help from another person eating meals?: A Little Help from another person taking care of personal grooming?: A Little Help from another person toileting, which includes using toliet, bedpan, or urinal?: Total Help from another person bathing (including washing,  rinsing, drying)?: Total Help from another person to put on and taking off regular upper body clothing?: Total Help from another person to put on and taking off regular lower body clothing?: Total 6 Click Score: 10   End of Session Nurse Communication: Patient requests pain meds  Activity Tolerance: Patient tolerated treatment well Patient left: in bed;with call bell/phone within reach  OT Visit Diagnosis: Other abnormalities of gait and mobility (R26.89)                Time: 1610-9604 OT Time Calculation (min): 19 min Charges:  OT General Charges $OT Visit: 1 Visit OT Evaluation $OT Eval Low Complexity: 1 Low  Arman Filter., MPH, MS, OTR/L ascom [phone] 01/12/23, 2:28 PM

## 2023-01-12 NOTE — Hospital Course (Addendum)
[name] is a 52 y.o. 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 Se[facility] 04/16, CT abd/pelv (+)large stool burden, otherwise no serious concerns noted w/ ostomy / cystectomy. 04/17: WBC 13.2, lactic acid 0.9, positive urinalysis (cloudy appearance, large amount of leukocyte, rare bacteria, WBC> 50), troponin negative x 2, negative PCR for COVID, flu and RSV, GFR> 60, blood pressure 130/64, heart rate 104, RR 20, oxygen saturation 100% on room air. CT-head negative.  Chest x-ray showed low lung volume, bilateral basilar subsegmental atelectasis.  Patient is admitted to telemetry bed as inpatient.  Dr. Amada Jupiter of neurology is consulted - MRI brain and cspine w/wo. Treating UTI to cover pseudonomas.  04/18: MRI brain and c-spine (+)Multiple bilateral periventricular white matter lesions c/w chronic demyelination, worse vs 04/22/2010. No contrast-enhancing lesions to indicate active demyelination in brain or c-spine --> avoid steroids, continue abx, PT. Neuro s/o. UCx (+)GNR 04/19: awaiting UCx ID/susceptibilities. Blood Cx NGx2d.  04/20: UCx (+)Klebsiella and Proteus, awaiting susceptibilities to Proteus.   Consultants:  Neurology  Procedures: none      ASSESSMENT & PLAN:   Principal Problem:   Complicated UTI (urinary tract infection) Active Problems:   Sepsis   Acute metabolic encephalopathy   Multiple sclerosis   Weakness of upper extremity   Depression   Chronic diastolic CHF (congestive heart failure)   Constipation   Obesity (BMI 30-39.9)   Sepsis due to complicated UTI (urinary tract infection): UCx (+)Klebsiella  criteria for sepsis with WBC 13.2 and heart rate 104 --> no longer meeting criteria Lactic acid is normal 0.9, procalcitonin <  0.10.   Patient has altered mental status which is resolved  Cefepime (patient received 1 dose of vancomycin and Flagyl in ED) - UCx shows sensitivity to this. Has had   Acute metabolic encephalopathy: improved Possibly due to UTI.   MRI and CT head negative. Frequent neurocheck can d/c Neurology s/o   Multiple sclerosis and weakness of upper extremity_left arm:  MRI of brain and C-spine not showing active demyelination but MRI brain is showing worsening compared to previous imaging Per neuro "suspect this represents unmasking of her underlying deficits in the setting of infection as opposed to acute flare" Appreciate neurology recs: avoid steroids, continue abx Continue home amantadine, baclofen, Neurontin   Constipation Senokot --> increased to 2 tabs bid for now also add miralax for now   Depression continue home medications   Chronic diastolic CHF (congestive heart failure) CHF seem to be compensated.   2D echo on 09/03/2021 showed EF of 60 to 65% with grade 1 diastolic dysfunction. Watch volume status closely   Obesity (BMI 30-39.9): Body weight 99.7 kg, BMI 37.58. Healthy diet Encourage exercise  Encourage losing weight  Dysphagia / GLobus sensation Outpatient f/u suspect need EGD    DVT prophylaxis: lovenox  Pertinent IV fluids/nutrition: d/c IV fluids today  Central lines / invasive devices: ureterosteomy, colostomy  Code Status: FULL CODE  ACP documentation reviewed: none on file   Current Admission Status: inpatient   TOC needs / Dispo plan: pend PT/OT eval  Barriers to discharge / significant pending items: await cultures, neurology recs, clinical improvement, PT/OT eval, expect may be able to dc tomorrow pending culture results

## 2023-01-12 NOTE — TOC Initial Note (Addendum)
Transition of Care [facility]) - Initial/Assessment Note    Patient Details  Name: [name] MRN: [id number] Date of Birth: 1971/04/25  [facility] CM/SW Contact:    Samantha Fitch, RN Phone Number: 01/12/2023, 3:00 PM  Clinical Narrative:                    Met with patient and caregiver [name].  [name] is with patient Monday-Saturday 5 hours a day  Patient lives at home with mother.  Someone is at the home at all times   Patient has hoyer life, power wheelchair, hospital bed, ramp and WC accessible van.  [name] transports to appointments  Patient states she does not have any DME needs  OT recommending home health however medicare requires RN, PT, and speech to also be indicated. MD updated   Patient will require EMS transport at discharge   Patient Goals and CMS Choice            Expected Discharge Plan and Services                                              Prior Living Arrangements/Services                       Activities of Daily Living Home Assistive Devices/Equipment: Eyeglasses ADL Screening (condition at time of admission) Patient's cognitive ability adequate to safely complete daily activities?: No Is the patient deaf or have difficulty hearing?: No Does the patient have difficulty seeing, even when wearing glasses/contacts?: No Does the patient have difficulty concentrating, remembering, or making decisions?: Yes Patient able to express need for assistance with ADLs?: Yes Does the patient have difficulty dressing or bathing?: Yes Independently performs ADLs?: No Communication: Independent Dressing (OT): Needs assistance Is this a change from baseline?: Pre-admission baseline Grooming: Needs assistance Is this a change from baseline?: Pre-admission baseline Feeding: Needs assistance Is this a change from baseline?: Pre-admission baseline Bathing: Needs assistance Is this a change from baseline?:  Pre-admission baseline Toileting: Needs assistance Is this a change from baseline?: Pre-admission baseline In/Out Bed: Needs assistance Is this a change from baseline?: Pre-admission baseline Walks in Home: Needs assistance Is this a change from baseline?: Pre-admission baseline Does the patient have difficulty walking or climbing stairs?: Yes Weakness of Legs: Both Weakness of Arms/Hands: Both  Permission Sought/Granted                  Emotional Assessment              Admission diagnosis:  Lower urinary tract infectious disease [N39.0] Complicated UTI (urinary tract infection) [N39.0] Sepsis [A41.9] Sepsis, due to unspecified organism, unspecified whether acute organ dysfunction present [A41.9] Patient Active Problem List   Diagnosis Date Noted   Complicated UTI (urinary tract infection) 01/11/2023   Acute metabolic encephalopathy 01/11/2023   Chronic diastolic CHF (congestive heart failure) 01/11/2023   Obesity (BMI 30-39.9) 01/11/2023   Depression 01/11/2023   Constipation 01/11/2023   Pressure injury of skin 12/05/2021   Nausea and vomiting 12/04/2021   Community acquired pneumonia 12/03/2021   Difficult intravenous access 12/03/2021   Weakness of upper extremity 09/03/2021   Sepsis 09/02/2021   Altered mental status 04/10/2017   Lower urinary tract infectious disease 08/09/2015   Exacerbation of multiple sclerosis 08/09/2015   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 [place] [address] Phone: [phone] Fax:  [phone]  [facility] [phone] - [place], [place] - [address] [place] [address] Phone: [phone] Fax: [phone]  [facility] [address] [place] - [address] [place] [address] Phone: [phone] Fax: [phone]     Social Determinants of Health (SDOH) Social History: SDOH Screenings   Tobacco Use: Low Risk  (01/11/2023)   SDOH Interventions:     Readmission Risk Interventions     No data to display

## 2023-01-12 NOTE — Progress Notes (Signed)
PROGRESS NOTE    [name]   ZOX:[id number] DOB: Oct 15, 1970  DOA: 01/11/2023 Date of Service: 01/12/23 PCP: [name], MD     Brief Narrative / Hospital Course:  [name] is a 52 y.o. 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 Seen at [facility] 04/16, CT abd/pelv (+)large stool burden, otherwise no serious concerns noted w/ ostomy / cystectomy. 04/17: WBC 13.2, lactic acid 0.9, positive urinalysis (cloudy appearance, large amount of leukocyte, rare bacteria, WBC> 50), troponin negative x 2, negative PCR for COVID, flu and RSV, GFR> 60, blood pressure 130/64, heart rate 104, RR 20, oxygen saturation 100% on room air. CT-head negative.  Chest x-ray showed low lung volume, bilateral basilar subsegmental atelectasis.  Patient is admitted to telemetry bed as inpatient.  Dr. Amada Jupiter of neurology is consulted - MRI brain and cspine w/wo. Treating UTI to cover pseudonomas.  04/18: MRI brain and c-spine (+)Multiple bilateral periventricular white matter lesions c/w chronic demyelination, worse vs 04/22/2010. No contrast-enhancing lesions to indicate active demyelination in brian or c-spine -->  avoid steroids, continue abx, PT. Neuro s/o. UCx (+)GNR  Consultants:  Neurology  Procedures: none      ASSESSMENT & PLAN:   Principal Problem:   Complicated UTI (urinary tract infection) Active Problems:   Sepsis   Acute metabolic encephalopathy   Multiple sclerosis   Weakness of upper extremity   Depression   Chronic diastolic CHF (congestive heart failure)   Constipation   Obesity (BMI 30-39.9)   Sepsis due to complicated UTI (urinary tract infection):  criteria for sepsis with WBC 13.2 and heart rate 104.   Lactic acid is normal 0.9,  procalcitonin < 0.10.   Patient has altered mental status. telemetry bed as inpatient Cefepime (patient received 1 dose of vancomycin and Flagyl in ED) Follow-up of blood culture urine culture IV fluid: 1 L normal saline, then 75 cc/h   Acute metabolic encephalopathy: improved Possibly due to UTI.   MRI and CT head negative. Frequent neurocheck can d/c Neurology s/p   Multiple sclerosis and weakness of upper extremity_left arm:  MRI of brain and C-spine not showing active demyelination but MRI brain is showing worsening compared to previous imaging Per neuro "suspect this represents unmasking of her underlying deficits in the setting of infection as opposed to acute flare" Appreciate neurology recs: avoid steroids, continue abx Continue home amantadine, baclofen, Neurontin   Constipation Senokot --> increased to 2 tabs bid for now also add miralax for now   Depression continue home medications   Chronic diastolic CHF (congestive heart failure) CHF seem to be compensated.   2D echo on 09/03/2021 showed EF of 60 to 65% with grade 1 diastolic dysfunction. Watch volume status closely   Obesity (BMI 30-39.9): Body weight 99.7 kg, BMI 37.58. Healthy diet Encourage exercise  Encourage losing weight   DVT prophylaxis: lovenox  Pertinent IV fluids/nutrition: NS 75 mL/h pending improvement in po intake  Central lines / invasive devices: ureterosteomy, colostomy  Code Status: FULL CODE  ACP documentation reviewed: none on file   Current Admission Status: inpatient   TOC needs / Dispo plan: pend PT/OT eval  Barriers to discharge / significant pending items: await cultures, neurology recs, clinical improvement, PT/OT eval, expect will be here 1-2 more days  Subjective / Brief ROS:  Patient reports overall feeling better and a bit stronger compared to yesterday Reports abdominal discomfort, generalized, tightness  Denies CP/SOB.  Denies new weakness.  Reports no  concerns w/ urination/defecation.   Family Communication: none at this time     Objective Findings:  Vitals:   01/11/23 1306 01/11/23 1850 01/12/23 0354 01/12/23 0738  BP: (!) 131/45 (!) 131/55 (!) 123/51 133/70  Pulse: 75 76 92 95  Resp: Temp:  98.3 F (36.8 C) 98.6 F (37 C) 98.5 F (36.9 C)  TempSrc:  Oral  Oral  SpO2: 100% 100% 97% 97%  Weight:      Height:        Intake/Output Summary (Last 24 hours) at 01/12/2023 1340 Last data filed at 01/12/2023 0900 Gross per 24 hour  Intake 1288.71 ml  Output 1900 ml  Net -611.29 ml   Filed Weights   01/11/23 0720  Weight: 99.3 kg    Examination:  Physical Exam Constitutional:      General: She is not in acute distress.    Appearance: She is not toxic-appearing.  Cardiovascular:     Rate and Rhythm: Normal rate and regular rhythm.     Heart sounds: Murmur heard.  Abdominal:     General: Bowel sounds are increased. There is distension.     Palpations: Abdomen is soft. There is no shifting dullness, hepatomegaly or mass.     Tenderness: There is generalized abdominal tenderness. There is no guarding or rebound.  Skin:    General: Skin is warm and dry.  Neurological:     General: No focal deficit present.     Mental Status: She is alert and oriented to person, place, and time.  Psychiatric:        Mood and Affect: Mood normal.        Behavior: Behavior normal.          Scheduled Medications:   amantadine  200 mg Oral BID   baclofen  40 mg Oral BID   cholecalciferol  1,000 Units Oral Daily   clotrimazole  10 mg Oral 5 X Daily   DULoxetine  60 mg Oral Daily   enoxaparin (LOVENOX) injection  0.5 mg/kg Subcutaneous Q24H   gabapentin  200 mg Oral q morning   gabapentin  800 mg Oral QHS   insulin aspart  0-5 Units Subcutaneous QHS   insulin aspart  0-9 Units Subcutaneous TID WC   mouth rinse  15 mL Mouth Rinse 4 times per day   polyethylene glycol  17 g Oral BID   senna-docusate  2 tablet Oral  BID    Continuous Infusions:  sodium chloride 75 mL/hr at 01/12/23 0606   ceFEPime (MAXIPIME) IV 2 g (01/12/23 0608)    PRN Medications:  acetaminophen, acetaminophen, ondansetron (ZOFRAN) IV, mouth rinse, oxyCODONE-acetaminophen  Antimicrobials from admission:  Anti-infectives (From admission, onward)    Start     Dose/Rate Route Frequency Ordered Stop   01/11/23 1400  ceFEPIme (MAXIPIME) 2 g in sodium chloride 0.9 % 100 mL IVPB        2 g 200 mL/hr over 30 Minutes Intravenous Every 8 hours 01/11/23 1114     01/11/23 0500  ceFEPIme (MAXIPIME) 2 g in sodium chloride 0.9 % 100 mL IVPB        2 g 200 mL/hr over 30 Minutes Intravenous  Once 01/11/23 0450 01/11/23 0641   01/11/23 0500  metroNIDAZOLE (FLAGYL) IVPB 500 mg  500 mg 100 mL/hr over 60 Minutes Intravenous  Once 01/11/23 0450 01/11/23 0749   01/11/23 0500  vancomycin (VANCOCIN) IVPB 1000 mg/200 mL premix        1,000 mg 200 mL/hr over 60 Minutes Intravenous  Once 01/11/23 0450 01/11/23 1610           Data Reviewed:  I have personally reviewed the following...  CBC: Recent Labs  Lab 01/10/23 2101 01/12/23 0700  WBC 13.2* 12.1*  NEUTROABS 8.9*  --   HGB 12.5 11.7*  HCT 37.6 35.5*  MCV 90.6 91.5  PLT 303 244   Basic Metabolic Panel: Recent Labs  Lab 01/10/23 2101 01/12/23 0504  NA 137 139  K 3.6 3.2*  CL 103 107  CO2 23 22  GLUCOSE 99 89  BUN 9 9  CREATININE 0.46 0.42*  CALCIUM 9.1 8.4*   GFR: Estimated Creatinine Clearance: 93.1 mL/min (A) (by C-G formula based on SCr of 0.42 mg/dL (L)). Liver Function Tests: Recent Labs  Lab 01/10/23 2101  AST 17  ALT 13  ALKPHOS 44  BILITOT 1.2  PROT 7.7  ALBUMIN 3.9   No results for input(s): "LIPASE", "AMYLASE" in the last 168 hours. No results for input(s): "AMMONIA" in the last 168 hours. Coagulation Profile: No results for input(s): "INR", "PROTIME" in the last 168 hours. Cardiac Enzymes: No results for input(s): "CKTOTAL", "CKMB",  "CKMBINDEX", "TROPONINI" in the last 168 hours. BNP (last 3 results) No results for input(s): "PROBNP" in the last 8760 hours. HbA1C: No results for input(s): "HGBA1C" in the last 72 hours. CBG: Recent Labs  Lab 01/11/23 1631 01/11/23 2118 01/12/23 0739 01/12/23 1136  GLUCAP 124* 99 91 92   Lipid Profile: No results for input(s): "CHOL", "HDL", "LDLCALC", "TRIG", "CHOLHDL", "LDLDIRECT" in the last 72 hours. Thyroid Function Tests: No results for input(s): "TSH", "T4TOTAL", "FREET4", "T3FREE", "THYROIDAB" in the last 72 hours. Anemia Panel: No results for input(s): "VITAMINB12", "FOLATE", "FERRITIN", "TIBC", "IRON", "RETICCTPCT" in the last 72 hours. Most Recent Urinalysis On File:     Component Value Date/Time   COLORURINE YELLOW (A) 01/11/2023 0456   APPEARANCEUR CLOUDY (A) 01/11/2023 0456   APPEARANCEUR Cloudy 10/05/2014 2100   LABSPEC 1.013 01/11/2023 0456   LABSPEC 1.009 10/05/2014 2100   PHURINE 7.0 01/11/2023 0456   GLUCOSEU NEGATIVE 01/11/2023 0456   GLUCOSEU Negative 10/05/2014 2100   HGBUR SMALL (A) 01/11/2023 0456   BILIRUBINUR NEGATIVE 01/11/2023 0456   BILIRUBINUR Negative 10/05/2014 2100   KETONESUR NEGATIVE 01/11/2023 0456   PROTEINUR 30 (A) 01/11/2023 0456   NITRITE NEGATIVE 01/11/2023 0456   LEUKOCYTESUR LARGE (A) 01/11/2023 0456   LEUKOCYTESUR 3+ 10/05/2014 2100   Sepsis Labs: @LABRCNTIP (procalcitonin:4,lacticidven:4) Microbiology: Recent Results (from the past 240 hour(s))  Blood culture (routine x 2)     Status: None (Preliminary result)   Collection Time: 01/11/23  3:15 AM   Specimen: BLOOD RIGHT ARM  Result Value Ref Range Status   Specimen Description BLOOD RIGHT ARM  Final   Special Requests   Final    BOTTLES DRAWN AEROBIC ONLY Blood Culture results may not be optimal due to an inadequate volume of blood received in culture bottles   Culture   Final    NO GROWTH < 12 HOURS Performed at [facility], [address]  [place], [place] [address]    Report Status PENDING  Incomplete  Blood culture (routine x 2)     Status: None (Preliminary result)   Collection Time: 01/11/23  3:15 AM  Specimen: BLOOD LEFT HAND  Result Value Ref Range Status   Specimen Description BLOOD LEFT HAND  Final   Special Requests   Final    BOTTLES DRAWN AEROBIC ONLY Blood Culture results may not be optimal due to an inadequate volume of blood received in culture bottles   Culture   Final    NO GROWTH < 12 HOURS Performed at [facility], [address], [place], [place] [address]    Report Status PENDING  Incomplete  Resp panel by RT-PCR (RSV, Flu A&B, Covid) Anterior Nasal Swab     Status: None   Collection Time: 01/11/23  4:10 AM   Specimen: Anterior Nasal Swab  Result Value Ref Range Status   SARS Coronavirus 2 by RT PCR NEGATIVE NEGATIVE Final    Comment: (NOTE) SARS-CoV-2 target nucleic acids are NOT DETECTED.  The SARS-CoV-2 RNA is generally detectable in upper respiratory specimens during the acute phase of infection. The lowest concentration of SARS-CoV-2 viral copies this assay can detect is 138 copies/mL. A negative result does not preclude SARS-Cov-2 infection and should not be used as the sole basis for treatment or other patient management decisions. A negative result may occur with  improper specimen collection/handling, submission of specimen other than nasopharyngeal swab, presence of viral mutation(s) within the areas targeted by this assay, and inadequate number of viral copies(<138 copies/mL). A negative result must be combined with clinical observations, patient history, and epidemiological information. The expected result is Negative.  Fact Sheet for Patients:  BloggerCourse.com  Fact Sheet for Healthcare Providers:  SeriousBroker.it  This test is no t yet approved or cleared by the [place] FDA and  has been authorized for detection  and/or diagnosis of SARS-CoV-2 by FDA under an Emergency Use Authorization (EUA). This EUA will remain  in effect (meaning this test can be used) for the duration of the COVID-19 declaration under Section 564(b)(1) of the Act, 21 U.S.C.section 360bbb-3(b)(1), unless the authorization is terminated  or revoked sooner.       Influenza A by PCR NEGATIVE NEGATIVE Final   Influenza B by PCR NEGATIVE NEGATIVE Final    Comment: (NOTE) The Xpert Xpress SARS-CoV-2/FLU/RSV plus assay is intended as an aid in the diagnosis of influenza from Nasopharyngeal swab specimens and should not be used as a sole basis for treatment. Nasal washings and aspirates are unacceptable for Xpert Xpress SARS-CoV-2/FLU/RSV testing.  Fact Sheet for Patients: BloggerCourse.com  Fact Sheet for Healthcare Providers: SeriousBroker.it  This test is not yet approved or cleared by the [place] FDA and has been authorized for detection and/or diagnosis of SARS-CoV-2 by FDA under an Emergency Use Authorization (EUA). This EUA will remain in effect (meaning this test can be used) for the duration of the COVID-19 declaration under Section 564(b)(1) of the Act, 21 U.S.C. section 360bbb-3(b)(1), unless the authorization is terminated or revoked.     Resp Syncytial Virus by PCR NEGATIVE NEGATIVE Final    Comment: (NOTE) Fact Sheet for Patients: BloggerCourse.com  Fact Sheet for Healthcare Providers: SeriousBroker.it  This test is not yet approved or cleared by the [place] FDA and has been authorized for detection and/or diagnosis of SARS-CoV-2 by FDA under an Emergency Use Authorization (EUA). This EUA will remain in effect (meaning this test can be used) for the duration of the COVID-19 declaration under Section 564(b)(1) of the Act, 21 U.S.C. section 360bbb-3(b)(1), unless the authorization is terminated  or revoked.  Performed at [facility], [address] [place], [place] [address]   Urine Culture     Status: Abnormal (Preliminary result)   Collection Time: 01/11/23  4:56 AM   Specimen: Urine, Random  Result Value Ref Range Status   Specimen Description   Final    URINE, RANDOM Performed at [facility], [address] [place], [place] [address]    Special Requests   Final    NONE Performed at [facility], [address], [place], [place] [address]    Culture (A)  Final    >=100,000 COLONIES/mL GRAM NEGATIVE RODS CULTURE REINCUBATED FOR BETTER GROWTH Performed at [facility], [address] [place], [place] [address]    Report Status PENDING  Incomplete      Radiology Studies last 3 days: MR BRAIN W WO CONTRAST  Result Date: 01/12/2023 CLINICAL DATA:  Multiple sclerosis EXAM: MRI HEAD WITHOUT AND WITH CONTRAST MRI CERVICAL SPINE WITHOUT AND WITH CONTRAST TECHNIQUE: Multiplanar, multiecho pulse sequences of the brain and surrounding structures, and cervical spine, to include the craniocervical junction and cervicothoracic junction, were obtained without and with intravenous contrast. CONTRAST:  10mL GADAVIST GADOBUTROL 1 MMOL/ML IV SOLN COMPARISON:  Brain MRI 04/22/2010 FINDINGS: MRI HEAD FINDINGS Brain: No acute infarct, mass effect or extra-axial collection. No chronic microhemorrhage or siderosis. Multiple bilateral periventricular white matter lesions in a pattern consistent with chronic demyelination. There are also lesions of both middle cerebellar peduncles. Mild T2 hyperintensity within the midbrain and periaqueductal gray matter. These findings have worsened compared to 04/22/2010. There are no contrast-enhancing lesions. Slow growth of 6 mm left anterior meningioma. (Series 23 image 114). The midline structures are normal. Vascular: Normal flow voids. Skull and upper cervical spine: Normal marrow signal. Sinuses/Orbits: Negative.  Other: None. MRI CERVICAL SPINE FINDINGS Alignment: Physiologic. Vertebrae: No fracture, evidence of discitis, or bone lesion. Cord: Motion degraded axial images but no visible demyelinating lesions. Posterior Fossa, vertebral arteries, paraspinal tissues: Negative. Disc levels: C1-2: Unremarkable. C2-3: Normal disc space and facet joints. There is no spinal canal stenosis. No neural foraminal stenosis. C3-4: Small central disc protrusion and left facet hypertrophy. There is no spinal canal stenosis. Mild right and moderate left neural foraminal stenosis. C4-5: Small disc bulge. There is no spinal canal stenosis. No neural foraminal stenosis. C5-6: Small disc bulge. There is no spinal canal stenosis. No neural foraminal stenosis. C6-7: Small right subarticular disc protrusion. There is no spinal canal stenosis. No neural foraminal stenosis. C7-T1: Normal disc space and facet joints. There is no spinal canal stenosis. No neural foraminal stenosis. IMPRESSION: 1. Multiple bilateral periventricular white matter lesions in a pattern consistent with chronic demyelination, worsened compared to 04/22/2010. No contrast-enhancing lesions to indicate active demyelination. 2. Slow growth of 6 mm left anterior meningioma. 3. Motion degraded cervical spine images without visible cervical spine demyelinating lesions. 4. Mild cervical degenerative disc disease without spinal canal stenosis. Mild right and moderate left neural foraminal stenosis at C3-4. Electronically Signed   By: Deatra Robinson M.D.   On: 01/12/2023 00:25   MR CERVICAL SPINE W WO CONTRAST  Result Date: 01/12/2023 CLINICAL DATA:  Multiple sclerosis EXAM: MRI HEAD WITHOUT AND WITH CONTRAST MRI CERVICAL SPINE WITHOUT AND WITH CONTRAST TECHNIQUE: Multiplanar, multiecho pulse sequences of the brain and surrounding structures, and cervical spine, to include the craniocervical junction and cervicothoracic junction, were obtained without and with intravenous contrast.  CONTRAST:  10mL GADAVIST GADOBUTROL 1 MMOL/ML IV SOLN COMPARISON:  Brain MRI 04/22/2010 FINDINGS: MRI HEAD FINDINGS Brain: No  acute infarct, mass effect or extra-axial collection. No chronic microhemorrhage or siderosis. Multiple bilateral periventricular white matter lesions in a pattern consistent with chronic demyelination. There are also lesions of both middle cerebellar peduncles. Mild T2 hyperintensity within the midbrain and periaqueductal gray matter. These findings have worsened compared to 04/22/2010. There are no contrast-enhancing lesions. Slow growth of 6 mm left anterior meningioma. (Series 23 image 114). The midline structures are normal. Vascular: Normal flow voids. Skull and upper cervical spine: Normal marrow signal. Sinuses/Orbits: Negative. Other: None. MRI CERVICAL SPINE FINDINGS Alignment: Physiologic. Vertebrae: No fracture, evidence of discitis, or bone lesion. Cord: Motion degraded axial images but no visible demyelinating lesions. Posterior Fossa, vertebral arteries, paraspinal tissues: Negative. Disc levels: C1-2: Unremarkable. C2-3: Normal disc space and facet joints. There is no spinal canal stenosis. No neural foraminal stenosis. C3-4: Small central disc protrusion and left facet hypertrophy. There is no spinal canal stenosis. Mild right and moderate left neural foraminal stenosis. C4-5: Small disc bulge. There is no spinal canal stenosis. No neural foraminal stenosis. C5-6: Small disc bulge. There is no spinal canal stenosis. No neural foraminal stenosis. C6-7: Small right subarticular disc protrusion. There is no spinal canal stenosis. No neural foraminal stenosis. C7-T1: Normal disc space and facet joints. There is no spinal canal stenosis. No neural foraminal stenosis. IMPRESSION: 1. Multiple bilateral periventricular white matter lesions in a pattern consistent with chronic demyelination, worsened compared to 04/22/2010. No contrast-enhancing lesions to indicate active  demyelination. 2. Slow growth of 6 mm left anterior meningioma. 3. Motion degraded cervical spine images without visible cervical spine demyelinating lesions. 4. Mild cervical degenerative disc disease without spinal canal stenosis. Mild right and moderate left neural foraminal stenosis at C3-4. Electronically Signed   By: Deatra Robinson M.D.   On: 01/12/2023 00:25   DG Chest Port 1 View  Result Date: 01/11/2023 CLINICAL DATA:  53 year old female with possible sepsis. EXAM: PORTABLE CHEST 1 VIEW COMPARISON:  Chest x-ray 12/03/2021. FINDINGS: Lung volumes are exceedingly low. Bibasilar opacities likely reflect areas of atelectasis. No definite pleural effusions. No pneumothorax. No evidence of pulmonary edema. Heart size is normal. Upper mediastinal contours are within normal limits allowing for low lung volumes and patient positioning. IMPRESSION: 1. Low lung volumes with probable bibasilar subsegmental atelectasis. Electronically Signed   By: Trudie Reed M.D.   On: 01/11/2023 05:40   CT Head Wo Contrast  Result Date: 01/11/2023 CLINICAL DATA:  Left upper extremity weakness, AMS EXAM: CT HEAD WITHOUT CONTRAST TECHNIQUE: Contiguous axial images were obtained from the base of the skull through the vertex without intravenous contrast. RADIATION DOSE REDUCTION: This exam was performed according to the departmental dose-optimization program which includes automated exposure control, adjustment of the mA and/or kV according to patient size and/or use of iterative reconstruction technique. COMPARISON:  04/10/2017 FINDINGS: Brain: No evidence of acute infarction, hemorrhage, hydrocephalus, extra-axial collection or mass lesion/mass effect. Vascular: No hyperdense vessel or unexpected calcification. Skull: Normal. Negative for fracture or focal lesion. Sinuses/Orbits: The visualized paranasal sinuses are essentially clear. The mastoid air cells are unopacified. Other: None. IMPRESSION: Normal head CT.  Electronically Signed   By: Charline Bills M.D.   On: 01/11/2023 02:54             LOS: 1 day      Sunnie Nielsen, DO [facility] 01/12/2023, 1:40 PM    Dictation software may have been used to generate the above note. Typos may occur and escape review in typed/dictated notes. Please contact  Dr Sheppard Coil directly for clarity if needed.  Staff may message me via secure chat in [place]  but this may not receive an immediate response,  please page me for urgent matters!  If 7PM-7AM, please contact night coverage [email or web address]

## 2023-01-12 NOTE — Evaluation (Signed)
Physical Therapy Evaluation Patient Details Name: [name] MRN: [id number] DOB: 03-18-1971 Today's Date: 01/12/2023  History of Present Illness  52 y.o. 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 Admitted for management of sepsis related to complicated UTI  Clinical Impression  Patient resting in bed upon arrival to room; alert and oriented to basic information.  Follows simple commands, pleasant and cooperative throughout session.   Endorses generalized ache/discomfort in abdomen; meds issued per RN prior to session.  Baseline paraplegia noted (0/5), with multi-beat non-sustained clonus to bilat ankles.  Fluctuating tone to bilat LEs with passive movement, but tends to diminish with intermittent resting periods.  ROM grossly WFL for basic transfers and OOB/chair positioning.  Bilat UEs generally weak (L UE 2+/5, R UE 3-/5), requiring increased time/effort with all functional activities.  Currently dependent for all pressure relief, repositioning and mobility efforts; at baseline level of ability (patient confirms). Per report, has necessary equipment (hospital bed, power WC, hoyer lift) and adequate physical support in home environment to manage care.  Aide assists with ADLs, LE therex/movement/positioning, OOB transfers as appropriate; family able to assist with aide not present/available. Appears to be at baseline level of functional ability; no acute skilled PT needs appreciated.  Will complete PT order at this time; please re-consult should needs change.     Recommendations for follow up therapy are one component of a multi-disciplinary discharge planning process, led by the attending physician.  Recommendations may be updated based on patient status, additional functional criteria and insurance  authorization.  Follow Up Recommendations       Assistance Recommended at Discharge    Patient can return home with the following  Assistance with cooking/housework;Assistance with feeding;Direct supervision/assist for medications management;Direct supervision/assist for financial management;Assist for transportation;Help with stairs or ramp for entrance;Two people to help with walking and/or transfers;Two people to help with bathing/dressing/bathroom    Equipment Recommendations  (has all necessary equipment)  Recommendations for Other Services       Functional Status Assessment Patient has not had a recent decline in their functional status     Precautions / Restrictions Precautions Precautions: Fall Precaution Comments: paraplegia Restrictions Weight Bearing Restrictions: No      Mobility  Bed Mobility               General bed mobility comments: Total assist for all repositioning in bed    Transfers                   General transfer comment: total assist hoyer lift at baseline    Ambulation/Gait               General Gait Details: non-ambulatory at baseline; power WC as primary mobility  Stairs            Wheelchair Mobility    Modified Rankin (Stroke Patients Only)       Balance                                             Pertinent Vitals/Pain Pain Assessment Pain Assessment: Faces Faces Pain Scale: Hurts even more Pain Location: stomach and low back Pain Descriptors / Indicators: Aching Pain Intervention(s): Limited activity within patient's tolerance, Monitored during session,  Repositioned, Premedicated before session    Home Living Family/patient expects to be discharged to:: Private residence Living Arrangements: Parent Available Help at Discharge: Family;Personal care attendant (Mon-Sat, 5 hours/day; Sun 3 hours/day) Type of Home: House Home Access: Ramped entrance       Home Layout: One  level Home Equipment: Hospital bed;Wheelchair - power;Wheelchair - manual;Other (comment) (hoyer lift) Additional Comments: has manual w/c, only uses power w/c; also has a Nurse, adult    Prior Function Prior Level of Function : Needs assist             Mobility Comments: Total assist for all mobility needs (including bed mobility, repositioning, transfers).  Utilizes power w/c at baseline (joystick control, tilt feature, pressure-relieving cushion); hoyer lift for all transfers, completing all ADLs at bed-level (does not attempt unsupported sitting at any time) ADLs Comments: Able to feed herself, wash her face and brush her teeth, aide assists with all other ADL/IADL     Hand Dominance   Dominant Hand: Right    Extremity/Trunk Assessment   Upper Extremity Assessment Upper Extremity Assessment:  (L UE grossly 2+/5 throughout, R UE grossly 3+/5 throughout.  Denies sensory deficit.  Able to bring R UE to face/mouth, hold cup for drinking from straw)    Lower Extremity Assessment Lower Extremity Assessment: Generalized weakness (0/5 throughout; generalized parethesia throughout.  Multi-beat, non-sustained clonus to bilat LEs; bilat ankle DF to neutral.  Prefers R LE in 'frog-leg' position, but does extend to neutral with passive assist)       Communication   Communication: No difficulties  Cognition Arousal/Alertness: Awake/alert Behavior During Therapy: WFL for tasks assessed/performed Overall Cognitive Status: Within Functional Limits for tasks assessed                                 General Comments: Alert and oriented to basic information; follows commands, pleasant and cooperative        General Comments      Exercises Other Exercises Other Exercises: Bilat LE passive ROM, 1x5-10: ankle pumps, heel slides, hip abduct/adduct, IR/ER.  Intermittent tone/spasm with passive movement, does tend to diminish with intermittent rest periods. Bilat LE skin quality  good   Assessment/Plan    PT Assessment Patient does not need any further PT services  PT Problem List         PT Treatment Interventions      PT Goals (Current goals can be found in the Care Plan section)  Acute Rehab PT Goals Patient Stated Goal: to return home PT Goal Formulation: All assessment and education complete, DC therapy Time For Goal Achievement: 01/12/23    Frequency       Co-evaluation               AM-PAC PT "6 Clicks" Mobility  Outcome Measure Help needed turning from your back to your side while in a flat bed without using bedrails?: Total Help needed moving from lying on your back to sitting on the side of a flat bed without using bedrails?: Total Help needed moving to and from a bed to a chair (including a wheelchair)?: Total Help needed standing up from a chair using your arms (e.g., wheelchair or bedside chair)?: Total Help needed to walk in hospital room?: Total Help needed climbing 3-5 steps with a railing? : Total 6 Click Score: 6    End of Session   Activity Tolerance: Patient tolerated treatment well  Patient left: in bed;with call bell/phone within reach;with bed alarm set   PT Visit Diagnosis: Muscle weakness (generalized) (M62.81)    Time: 8756-4332 PT Time Calculation (min) (ACUTE ONLY): 21 min   Charges:   PT Evaluation $PT Eval Low Complexity: 1 Low PT Treatments $Therapeutic Exercise: 8-22 mins         H. Manson Passey, PT, DPT, NCS 01/12/23, 3:14 PM [phone]

## 2023-01-12 NOTE — Progress Notes (Signed)
Patient's left-sided spasticity has significantly improved, she now has some movement of her left arm.  She has no movement in either lower extremity, but states is baseline.  She continues to have some latency of speech.  She has no evidence of enhancement on MRI brain or cervical spine, therefore I suspect this represents unmasking of her underlying deficits in the setting of infection as opposed to acute flare.  With the fact that she has primary progressive MS which does not typically have flares, this would be in keeping with that.  I would therefore not recommend IV Solu-Medrol at the current time, but would favor continued treatment with antibiotics.  She may benefit from physical therapy while inpatient.  No further disease specific management other than continuing her home baclofen dose for her MS, and neurology will continue to be available on an as-needed basis.  Samantha Slot, MD [facility] [phone]  If 7pm- 7am, please page neurology on call as listed in [facility].

## 2023-01-13 DIAGNOSIS — N390 Urinary tract infection, site not specified: Secondary | ICD-10-CM | POA: Diagnosis not present

## 2023-01-13 LAB — BASIC METABOLIC PANEL
Anion gap: 8 (ref 5–15)
BUN: 10 mg/dL (ref 6–20)
CO2: 22 mmol/L (ref 22–32)
Calcium: 8.8 mg/dL — ABNORMAL LOW (ref 8.9–10.3)
Chloride: 108 mmol/L (ref 98–111)
Creatinine, Ser: 0.41 mg/dL — ABNORMAL LOW (ref 0.44–1.00)
GFR, Estimated: >60 mL/min (ref >60)
Glucose, Bld: 83 mg/dL (ref 70–99)
Potassium: 3.9 mmol/L (ref 3.5–5.1)
Sodium: 138 mmol/L (ref 135–145)

## 2023-01-13 LAB — GLUCOSE, CAPILLARY: Glucose-Capillary: 82 mg/dL (ref 70–99)

## 2023-01-13 MED ORDER — SODIUM CHLORIDE 0.9 % IV SOLN: INTRAVENOUS | Status: DC | PRN

## 2023-01-13 NOTE — Progress Notes (Signed)
Order received to d/c CBG's for ac and hs and only check daily in the a.m.

## 2023-01-13 NOTE — Progress Notes (Signed)
Patient verbalizes having acid reflux/being able to taste food come up after she eats. MD is aware. Patient will follow up as planned after discharge with her medical diet

## 2023-01-13 NOTE — Progress Notes (Addendum)
PROGRESS NOTE    [name]   ZOX:[id number] DOB: [birth year]  DOA: 01/11/2023 Date of Service: 01/13/23 PCP: [name], MD     Brief Narrative / Hospital Course:  [name] is a 52 y.o. 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 Seen at [facility] 04/16, CT abd/pelv (+)large stool burden, otherwise no serious concerns noted w/ ostomy / cystectomy. 04/17: WBC 13.2, lactic acid 0.9, positive urinalysis (cloudy appearance, large amount of leukocyte, rare bacteria, WBC> 50), troponin negative x 2, negative PCR for COVID, flu and RSV, GFR> 60, blood pressure 130/64, heart rate 104, RR 20, oxygen saturation 100% on room air. CT-head negative.  Chest x-ray showed low lung volume, bilateral basilar subsegmental atelectasis.  Patient is admitted to telemetry bed as inpatient.  Dr. Amada Jupiter of neurology is consulted - MRI brain and cspine w/wo. Treating UTI to cover pseudonomas.  04/18: MRI brain and c-spine (+)Multiple bilateral periventricular white matter lesions c/w chronic demyelination, worse vs 04/22/2010. No contrast-enhancing lesions to indicate active demyelination in brain or c-spine --> avoid steroids, continue abx, PT. Neuro s/o. UCx (+)GNR 04/19: awaiting UCx ID/susceptibilities. Blood Cx NGx2d.   Consultants:  Neurology  Procedures: none      ASSESSMENT & PLAN:   Principal Problem:   Complicated UTI (urinary tract infection) Active Problems:   Sepsis   Acute metabolic encephalopathy   Multiple sclerosis   Weakness of upper extremity   Depression   Chronic diastolic CHF (congestive heart failure)   Constipation   Obesity (BMI 30-39.9)   Sepsis due to complicated UTI (urinary tract infection):  criteria for sepsis with WBC 13.2  and heart rate 104 --> no longer meeting criteria Lactic acid is normal 0.9, procalcitonin < 0.10.   Patient has altered mental status which is resolved  Cefepime (patient received 1 dose of vancomycin and Flagyl in ED) Follow-up blood culture urine culture   Acute metabolic encephalopathy: improved Possibly due to UTI.   MRI and CT head negative. Frequent neurocheck can d/c Neurology s/o   Multiple sclerosis and weakness of upper extremity_left arm:  MRI of brain and C-spine not showing active demyelination but MRI brain is showing worsening compared to previous imaging Per neuro "suspect this represents unmasking of her underlying deficits in the setting of infection as opposed to acute flare" Appreciate neurology recs: avoid steroids, continue abx Continue home amantadine, baclofen, Neurontin   Constipation Senokot --> increased to 2 tabs bid for now also add miralax for now   Depression continue home medications   Chronic diastolic CHF (congestive heart failure) CHF seem to be compensated.   2D echo on 09/03/2021 showed EF of 60 to 65% with grade 1 diastolic dysfunction. Watch volume status closely   Obesity (BMI 30-39.9): Body weight 99.7 kg, BMI 37.58. Healthy diet Encourage exercise  Encourage losing weight  Dysphagia / GLobus sensation Outpatient f/u suspect need EGD    DVT prophylaxis: lovenox  Pertinent IV fluids/nutrition: d/c IV fluids today  Central lines / invasive devices: ureterosteomy, colostomy  Code Status: FULL CODE  ACP documentation reviewed: none on file   Current Admission Status: inpatient   TOC needs / Dispo plan: pend PT/OT eval  Barriers to discharge / significant pending items: await cultures, neurology recs, clinical improvement, PT/OT eval, expect may be able to  dc tomorrow pending culture results          Subjective / Brief ROS:  Patient reports overall feeling better and a bit stronger compared to yesterday Reports dysphagia  like not really food getting stuck, but feels like not going down / can taste it after swallowing  Denies CP/SOB.  Denies new weakness.  Reports no concerns w/ urination/defecation.   Family Communication: none at this time     Objective Findings:  Vitals:   01/13/23 0437 01/13/23 0500 01/13/23 0758 01/13/23 1511  BP: (!) 133/59  (!) 144/80 122/60  Pulse: 74  78 77  Resp: 20   16  Temp: 98.1 F (36.7 C)  98.6 F (37 C) 97.8 F (36.6 C)  TempSrc:   Oral   SpO2: 100%  100% 99%  Weight:  98 kg    Height:        Intake/Output Summary (Last 24 hours) at 01/13/2023 1642 Last data filed at 01/13/2023 1519 Gross per 24 hour  Intake 3382.16 ml  Output 2000 ml  Net 1382.16 ml    Filed Weights   01/11/23 0720 01/13/23 0500  Weight: 99.3 kg 98 kg    Examination:  Physical Exam Constitutional:      General: She is not in acute distress.    Appearance: She is not toxic-appearing.  Cardiovascular:     Rate and Rhythm: Normal rate and regular rhythm.     Heart sounds: Murmur heard.  Abdominal:     General: Bowel sounds are increased. There is distension.     Palpations: Abdomen is soft. There is no shifting dullness, hepatomegaly or mass.     Tenderness: There is generalized abdominal tenderness. There is no guarding or rebound.  Skin:    General: Skin is warm and dry.  Neurological:     General: No focal deficit present.     Mental Status: She is alert and oriented to person, place, and time.  Psychiatric:        Mood and Affect: Mood normal.        Behavior: Behavior normal.          Scheduled Medications:   amantadine  200 mg Oral BID   baclofen  40 mg Oral BID   cholecalciferol  1,000 Units Oral Daily   clotrimazole  10 mg Oral 5 X Daily   DULoxetine  60 mg Oral Daily   enoxaparin (LOVENOX) injection  0.5 mg/kg Subcutaneous Q24H   gabapentin  200 mg Oral q morning   gabapentin  800 mg Oral QHS   mouth rinse  15 mL Mouth Rinse 4 times per day     Continuous Infusions:  ceFEPime (MAXIPIME) IV Stopped (01/13/23 1422)    PRN Medications:  acetaminophen, acetaminophen, ondansetron (ZOFRAN) IV, mouth rinse, oxyCODONE-acetaminophen  Antimicrobials from admission:  Anti-infectives (From admission, onward)    Start     Dose/Rate Route Frequency Ordered Stop   01/11/23 1400  ceFEPIme (MAXIPIME) 2 g in sodium chloride 0.9 % 100 mL IVPB        2 g 200 mL/hr over 30 Minutes Intravenous Every 8 hours 01/11/23 1114     01/11/23 0500  ceFEPIme (MAXIPIME) 2 g in sodium chloride 0.9 % 100 mL IVPB        2 g 200 mL/hr over 30 Minutes Intravenous  Once 01/11/23 0450 01/11/23 0641   01/11/23 0500  metroNIDAZOLE (FLAGYL) IVPB 500 mg        500 mg 100 mL/hr over  60 Minutes Intravenous  Once 01/11/23 0450 01/11/23 0749   01/11/23 0500  vancomycin (VANCOCIN) IVPB 1000 mg/200 mL premix        1,000 mg 200 mL/hr over 60 Minutes Intravenous  Once 01/11/23 0450 01/11/23 8413           Data Reviewed:  I have personally reviewed the following...  CBC: Recent Labs  Lab 01/10/23 2101 01/12/23 0700  WBC 13.2* 12.1*  NEUTROABS 8.9*  --   HGB 12.5 11.7*  HCT 37.6 35.5*  MCV 90.6 91.5  PLT 303 244    Basic Metabolic Panel: Recent Labs  Lab 01/10/23 2101 01/12/23 0504 01/13/23 0513  NA 137 139 138  K 3.6 3.2* 3.9  CL 103 107 108  CO2 GLUCOSE 99 89 83  BUN CREATININE 0.46 0.42* 0.41*  CALCIUM 9.1 8.4* 8.8*    GFR: Estimated Creatinine Clearance: 92.4 mL/min (A) (by C-G formula based on SCr of 0.41 mg/dL (L)). Liver Function Tests: Recent Labs  Lab 01/10/23 2101  AST 17  ALT 13  ALKPHOS 44  BILITOT 1.2  PROT 7.7  ALBUMIN 3.9    No results for input(s): "LIPASE", "AMYLASE" in the last 168 hours. No results for input(s): "AMMONIA" in the last 168 hours. Coagulation Profile: No results for input(s): "INR", "PROTIME" in the last 168 hours. Cardiac Enzymes: No results for input(s): "CKTOTAL",  "CKMB", "CKMBINDEX", "TROPONINI" in the last 168 hours. BNP (last 3 results) No results for input(s): "PROBNP" in the last 8760 hours. HbA1C: No results for input(s): "HGBA1C" in the last 72 hours. CBG: Recent Labs  Lab 01/11/23 2118 01/12/23 0739 01/12/23 1136 01/12/23 2126 01/13/23 0752  GLUCAP 99 91 92 82 82    Lipid Profile: No results for input(s): "CHOL", "HDL", "LDLCALC", "TRIG", "CHOLHDL", "LDLDIRECT" in the last 72 hours. Thyroid Function Tests: No results for input(s): "TSH", "T4TOTAL", "FREET4", "T3FREE", "THYROIDAB" in the last 72 hours. Anemia Panel: No results for input(s): "VITAMINB12", "FOLATE", "FERRITIN", "TIBC", "IRON", "RETICCTPCT" in the last 72 hours. Most Recent Urinalysis On File:     Component Value Date/Time   COLORURINE YELLOW (A) 01/11/2023 0456   APPEARANCEUR CLOUDY (A) 01/11/2023 0456   APPEARANCEUR Cloudy 10/05/2014 2100   LABSPEC 1.013 01/11/2023 0456   LABSPEC 1.009 10/05/2014 2100   PHURINE 7.0 01/11/2023 0456   GLUCOSEU NEGATIVE 01/11/2023 0456   GLUCOSEU Negative 10/05/2014 2100   HGBUR SMALL (A) 01/11/2023 0456   BILIRUBINUR NEGATIVE 01/11/2023 0456   BILIRUBINUR Negative 10/05/2014 2100   KETONESUR NEGATIVE 01/11/2023 0456   PROTEINUR 30 (A) 01/11/2023 0456   NITRITE NEGATIVE 01/11/2023 0456   LEUKOCYTESUR LARGE (A) 01/11/2023 0456   LEUKOCYTESUR 3+ 10/05/2014 2100   Sepsis Labs: (procalcitonin:4,lacticidven:4) Microbiology: Recent Results (from the past 240 hour(s))  Blood culture (routine x 2)     Status: None (Preliminary result)   Collection Time: 01/11/23  3:15 AM   Specimen: BLOOD RIGHT ARM  Result Value Ref Range Status   Specimen Description BLOOD RIGHT ARM  Final   Special Requests   Final    BOTTLES DRAWN AEROBIC ONLY Blood Culture results may not be optimal due to an inadequate volume of blood received in culture bottles   Culture   Final    NO GROWTH 2 DAYS Performed at [facility], [address], [place], [place] [address]    Report Status PENDING  Incomplete  Blood culture (routine x 2)     Status:  None (Preliminary result)   Collection Time: 01/11/23  3:15 AM   Specimen: BLOOD LEFT HAND  Result Value Ref Range Status   Specimen Description BLOOD LEFT HAND  Final   Special Requests   Final    BOTTLES DRAWN AEROBIC ONLY Blood Culture results may not be optimal due to an inadequate volume of blood received in culture bottles   Culture   Final    NO GROWTH 2 DAYS Performed at [facility], [address] [place], [place] [address]    Report Status PENDING  Incomplete  Resp panel by RT-PCR (RSV, Flu A&B, Covid) Anterior Nasal Swab     Status: None   Collection Time: 01/11/23  4:10 AM   Specimen: Anterior Nasal Swab  Result Value Ref Range Status   SARS Coronavirus 2 by RT PCR NEGATIVE NEGATIVE Final    Comment: (NOTE) SARS-CoV-2 target nucleic acids are NOT DETECTED.  The SARS-CoV-2 RNA is generally detectable in upper respiratory specimens during the acute phase of infection. The lowest concentration of SARS-CoV-2 viral copies this assay can detect is 138 copies/mL. A negative result does not preclude SARS-Cov-2 infection and should not be used as the sole basis for treatment or other patient management decisions. A negative result may occur with  improper specimen collection/handling, submission of specimen other than nasopharyngeal swab, presence of viral mutation(s) within the areas targeted by this assay, and inadequate number of viral copies(<138 copies/mL). A negative result must be combined with clinical observations, patient history, and epidemiological information. The expected result is Negative.  Fact Sheet for Patients:  BloggerCourse.com  Fact Sheet for Healthcare Providers:  SeriousBroker.it  This test is no t yet approved or cleared by the [place] FDA and  has been authorized  for detection and/or diagnosis of SARS-CoV-2 by FDA under an Emergency Use Authorization (EUA). This EUA will remain  in effect (meaning this test can be used) for the duration of the COVID-19 declaration under Section 564(b)(1) of the Act, 21 U.S.C.section 360bbb-3(b)(1), unless the authorization is terminated  or revoked sooner.       Influenza A by PCR NEGATIVE NEGATIVE Final   Influenza B by PCR NEGATIVE NEGATIVE Final    Comment: (NOTE) The Xpert Xpress SARS-CoV-2/FLU/RSV plus assay is intended as an aid in the diagnosis of influenza from Nasopharyngeal swab specimens and should not be used as a sole basis for treatment. Nasal washings and aspirates are unacceptable for Xpert Xpress SARS-CoV-2/FLU/RSV testing.  Fact Sheet for Patients: BloggerCourse.com  Fact Sheet for Healthcare Providers: SeriousBroker.it  This test is not yet approved or cleared by the [place] FDA and has been authorized for detection and/or diagnosis of SARS-CoV-2 by FDA under an Emergency Use Authorization (EUA). This EUA will remain in effect (meaning this test can be used) for the duration of the COVID-19 declaration under Section 564(b)(1) of the Act, 21 U.S.C. section 360bbb-3(b)(1), unless the authorization is terminated or revoked.     Resp Syncytial Virus by PCR NEGATIVE NEGATIVE Final    Comment: (NOTE) Fact Sheet for Patients: BloggerCourse.com  Fact Sheet for Healthcare Providers: SeriousBroker.it  This test is not yet approved or cleared by the [place] FDA and has been authorized for detection and/or diagnosis of SARS-CoV-2 by FDA under an Emergency Use Authorization (EUA). This EUA will remain in effect (meaning this test can be used) for the duration of the COVID-19 declaration under Section 564(b)(1) of the Act, 21 U.S.C. section 360bbb-3(b)(1), unless the authorization is  terminated or revoked.  Performed at [facility], [address] [place], [place] [address]   Urine Culture     Status: Abnormal (Preliminary result)   Collection Time: 01/11/23  4:56 AM   Specimen: Urine, Random  Result Value Ref Range Status   Specimen Description   Final    URINE, RANDOM Performed at [facility], [address] [place], [place] [address]    Special Requests   Final    NONE Performed at [facility], [address] [place], [place] [address]    Culture (A)  Final    >=100,000 COLONIES/mL KLEBSIELLA PNEUMONIAE >=100,000 COLONIES/mL PROTEUS MIRABILIS SUSCEPTIBILITIES TO FOLLOW Performed at [facility], [address] [place], [place] [address]    Report Status PENDING  Incomplete      Radiology Studies last 3 days: MR BRAIN W WO CONTRAST  Result Date: 01/12/2023 CLINICAL DATA:  Multiple sclerosis EXAM: MRI HEAD WITHOUT AND WITH CONTRAST MRI CERVICAL SPINE WITHOUT AND WITH CONTRAST TECHNIQUE: Multiplanar, multiecho pulse sequences of the brain and surrounding structures, and cervical spine, to include the craniocervical junction and cervicothoracic junction, were obtained without and with intravenous contrast. CONTRAST:  10mL GADAVIST GADOBUTROL 1 MMOL/ML IV SOLN COMPARISON:  Brain MRI 04/22/2010 FINDINGS: MRI HEAD FINDINGS Brain: No acute infarct, mass effect or extra-axial collection. No chronic microhemorrhage or siderosis. Multiple bilateral periventricular white matter lesions in a pattern consistent with chronic demyelination. There are also lesions of both middle cerebellar peduncles. Mild T2 hyperintensity within the midbrain and periaqueductal gray matter. These findings have worsened compared to 04/22/2010. There are no contrast-enhancing lesions. Slow growth of 6 mm left anterior meningioma. (Series 23 image 114). The midline structures are normal. Vascular: Normal flow voids. Skull and upper cervical spine: Normal  marrow signal. Sinuses/Orbits: Negative. Other: None. MRI CERVICAL SPINE FINDINGS Alignment: Physiologic. Vertebrae: No fracture, evidence of discitis, or bone lesion. Cord: Motion degraded axial images but no visible demyelinating lesions. Posterior Fossa, vertebral arteries, paraspinal tissues: Negative. Disc levels: C1-2: Unremarkable. C2-3: Normal disc space and facet joints. There is no spinal canal stenosis. No neural foraminal stenosis. C3-4: Small central disc protrusion and left facet hypertrophy. There is no spinal canal stenosis. Mild right and moderate left neural foraminal stenosis. C4-5: Small disc bulge. There is no spinal canal stenosis. No neural foraminal stenosis. C5-6: Small disc bulge. There is no spinal canal stenosis. No neural foraminal stenosis. C6-7: Small right subarticular disc protrusion. There is no spinal canal stenosis. No neural foraminal stenosis. C7-T1: Normal disc space and facet joints. There is no spinal canal stenosis. No neural foraminal stenosis. IMPRESSION: 1. Multiple bilateral periventricular white matter lesions in a pattern consistent with chronic demyelination, worsened compared to 04/22/2010. No contrast-enhancing lesions to indicate active demyelination. 2. Slow growth of 6 mm left anterior meningioma. 3. Motion degraded cervical spine images without visible cervical spine demyelinating lesions. 4. Mild cervical degenerative disc disease without spinal canal stenosis. Mild right and moderate left neural foraminal stenosis at C3-4. Electronically Signed   By: Deatra Robinson M.D.   On: 01/12/2023 00:25   MR CERVICAL SPINE W WO CONTRAST  Result Date: 01/12/2023 CLINICAL DATA:  Multiple sclerosis EXAM: MRI HEAD WITHOUT AND WITH CONTRAST MRI CERVICAL SPINE WITHOUT AND WITH CONTRAST TECHNIQUE: Multiplanar, multiecho pulse sequences of the brain and surrounding structures, and cervical spine, to include the craniocervical junction and cervicothoracic junction, were  obtained without and with intravenous contrast. CONTRAST:  10mL GADAVIST GADOBUTROL 1 MMOL/ML  IV SOLN COMPARISON:  Brain MRI 04/22/2010 FINDINGS: MRI HEAD FINDINGS Brain: No acute infarct, mass effect or extra-axial collection. No chronic microhemorrhage or siderosis. Multiple bilateral periventricular white matter lesions in a pattern consistent with chronic demyelination. There are also lesions of both middle cerebellar peduncles. Mild T2 hyperintensity within the midbrain and periaqueductal gray matter. These findings have worsened compared to 04/22/2010. There are no contrast-enhancing lesions. Slow growth of 6 mm left anterior meningioma. (Series 23 image 114). The midline structures are normal. Vascular: Normal flow voids. Skull and upper cervical spine: Normal marrow signal. Sinuses/Orbits: Negative. Other: None. MRI CERVICAL SPINE FINDINGS Alignment: Physiologic. Vertebrae: No fracture, evidence of discitis, or bone lesion. Cord: Motion degraded axial images but no visible demyelinating lesions. Posterior Fossa, vertebral arteries, paraspinal tissues: Negative. Disc levels: C1-2: Unremarkable. C2-3: Normal disc space and facet joints. There is no spinal canal stenosis. No neural foraminal stenosis. C3-4: Small central disc protrusion and left facet hypertrophy. There is no spinal canal stenosis. Mild right and moderate left neural foraminal stenosis. C4-5: Small disc bulge. There is no spinal canal stenosis. No neural foraminal stenosis. C5-6: Small disc bulge. There is no spinal canal stenosis. No neural foraminal stenosis. C6-7: Small right subarticular disc protrusion. There is no spinal canal stenosis. No neural foraminal stenosis. C7-T1: Normal disc space and facet joints. There is no spinal canal stenosis. No neural foraminal stenosis. IMPRESSION: 1. Multiple bilateral periventricular white matter lesions in a pattern consistent with chronic demyelination, worsened compared to 04/22/2010. No  contrast-enhancing lesions to indicate active demyelination. 2. Slow growth of 6 mm left anterior meningioma. 3. Motion degraded cervical spine images without visible cervical spine demyelinating lesions. 4. Mild cervical degenerative disc disease without spinal canal stenosis. Mild right and moderate left neural foraminal stenosis at C3-4. Electronically Signed   By: Deatra Robinson M.D.   On: 01/12/2023 00:25   DG Chest Port 1 View  Result Date: 01/11/2023 CLINICAL DATA:  52 year old female with possible sepsis. EXAM: PORTABLE CHEST 1 VIEW COMPARISON:  Chest x-ray 12/03/2021. FINDINGS: Lung volumes are exceedingly low. Bibasilar opacities likely reflect areas of atelectasis. No definite pleural effusions. No pneumothorax. No evidence of pulmonary edema. Heart size is normal. Upper mediastinal contours are within normal limits allowing for low lung volumes and patient positioning. IMPRESSION: 1. Low lung volumes with probable bibasilar subsegmental atelectasis. Electronically Signed   By: Trudie Reed M.D.   On: 01/11/2023 05:40   CT Head Wo Contrast  Result Date: 01/11/2023 CLINICAL DATA:  Left upper extremity weakness, AMS EXAM: CT HEAD WITHOUT CONTRAST TECHNIQUE: Contiguous axial images were obtained from the base of the skull through the vertex without intravenous contrast. RADIATION DOSE REDUCTION: This exam was performed according to the departmental dose-optimization program which includes automated exposure control, adjustment of the mA and/or kV according to patient size and/or use of iterative reconstruction technique. COMPARISON:  04/10/2017 FINDINGS: Brain: No evidence of acute infarction, hemorrhage, hydrocephalus, extra-axial collection or mass lesion/mass effect. Vascular: No hyperdense vessel or unexpected calcification. Skull: Normal. Negative for fracture or focal lesion. Sinuses/Orbits: The visualized paranasal sinuses are essentially clear. The mastoid air cells are unopacified. Other:  None. IMPRESSION: Normal head CT. Electronically Signed   By: Charline Bills M.D.   On: 01/11/2023 02:54             LOS: 2 days      Sunnie Nielsen, DO [facility] 01/13/2023, 4:42 PM    Dictation software may have been used to generate the  above note. Typos may occur and escape review in typed/dictated notes. Please contact Dr Sheppard Coil directly for clarity if needed.  Staff may message me via secure chat in [place]  but this may not receive an immediate response,  please page me for urgent matters!  If 7PM-7AM, please contact night coverage [email or web address]

## 2023-01-13 NOTE — Care Management Important Message (Signed)
Important Message  Patient Details  Name: [name] MRN: [id number] Date of Birth: 1971-07-05   Medicare Important Message Given:  N/A - LOS <3 / Initial given by admissions     Johnell Comings 01/13/2023, 12:51 PM

## 2023-01-14 DIAGNOSIS — N390 Urinary tract infection, site not specified: Secondary | ICD-10-CM | POA: Diagnosis not present

## 2023-01-14 LAB — URINE CULTURE

## 2023-01-14 LAB — GLUCOSE, CAPILLARY: Glucose-Capillary: 113 mg/dL — ABNORMAL HIGH (ref 70–99)

## 2023-01-14 MED ORDER — PANTOPRAZOLE SODIUM 40 MG PO TBEC
40.0000 mg | DELAYED_RELEASE_TABLET | Freq: Two times a day (BID) | ORAL | Status: DC
  Administered 2023-01-14: 40 mg via ORAL
  Filled 2023-01-14: qty 1

## 2023-01-14 MED ORDER — ORAL CARE MOUTH RINSE: 15.0000 mL | OROMUCOSAL | Status: DC | PRN

## 2023-01-14 MED ORDER — CEPHALEXIN 500 MG PO CAPS: 500.0000 mg | ORAL_CAPSULE | Freq: Two times a day (BID) | ORAL | 0 refills | Status: AC

## 2023-01-14 MED ORDER — CEPHALEXIN 500 MG PO CAPS
500.0000 mg | ORAL_CAPSULE | Freq: Two times a day (BID) | ORAL | Status: DC
  Administered 2023-01-14: 500 mg via ORAL
  Filled 2023-01-14: qty 1

## 2023-01-14 MED ORDER — PANTOPRAZOLE SODIUM 40 MG PO TBEC: 40.0000 mg | DELAYED_RELEASE_TABLET | Freq: Two times a day (BID) | ORAL | 0 refills | Status: DC

## 2023-01-14 NOTE — TOC Transition Note (Signed)
Transition of Care [facility]) - CM/SW Discharge Note   Patient Details  Name: [name] MRN: [id number] Date of Birth: 1971-05-25  [facility] CM/SW Contact:  Bing Quarry, RN Phone Number: 01/14/2023, 1:16 PM   Clinical Narrative:  01/14/23: Patient discharging to home today. Will need [facility] transport to home address listed in chart. Forms printed to unit RN per request and RN will call for [facility] when ready. Patient has all needed DME at home. Did not qualify for [facility] PT post discharge as back to baseline per PT notes. Gabriel Cirri RN CM            Patient Goals and CMS Choice      Discharge Placement                         Discharge Plan and Services Additional resources added to the After Visit Summary for                                       Social Determinants of Health (SDOH) Interventions SDOH Screenings   Tobacco Use: Low Risk  (01/11/2023)     Readmission Risk Interventions     No data to display

## 2023-01-14 NOTE — Discharge Summary (Signed)
Physician Discharge Summary   Patient: [name] MRN: [id number]  DOB: 1971-09-06   Admit:     Date of Admission: 01/11/2023 Admitted from: home   Discharge: Date of discharge: 01/14/23 Disposition: Home Condition at discharge: good  CODE STATUS: FULL CODE     Discharge Physician: Sunnie Nielsen, DO [facility]     PCP: [name], MD  Recommendations for Outpatient Follow-up:  Follow up with PCP [name], MD in 1-2 weeks Please obtain labs/tests: CBC, BMP, UA in 1-2 weeks Please follow up on the following pending results: none PCP AND OTHER OUTPATIENT PROVIDERS: SEE BELOW FOR SPECIFIC DISCHARGE INSTRUCTIONS PRINTED FOR PATIENT IN ADDITION TO GENERIC AVS PATIENT INFO  Follow w/ gastroenterology for dysphagia, suspect needs EGD Follow w/ urology, suspect may need suppressive abx or ID follow-up    Discharge Instructions     Ambulatory referral to Gastroenterology   Complete by: As directed    What is the reason for referral?: Other Comment - dysphagia, suspect needs EGD   Diet - low sodium heart healthy   Complete by: As directed    Increase activity slowly   Complete by: As directed          Discharge Diagnoses: Principal Problem:   Complicated UTI (urinary tract infection) Active Problems:   Sepsis   Acute metabolic encephalopathy   Multiple sclerosis   Weakness of upper extremity   Depression   Chronic diastolic CHF (congestive heart failure)   Constipation   Obesity (BMI 30-39.9)       Hospital Course: KHALESSI Pratt is a 52 y.o. 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 Seen at [facility] 04/16, CT abd/pelv (+)large stool burden, otherwise no serious concerns noted w/ ostomy /  cystectomy. 04/17: WBC 13.2, lactic acid 0.9, positive urinalysis (cloudy appearance, large amount of leukocyte, rare bacteria, WBC> 50), troponin negative x 2, negative PCR for COVID, flu and RSV, GFR> 60, blood pressure 130/64, heart rate 104, RR 20, oxygen saturation 100% on room air. CT-head negative.  Chest x-ray showed low lung volume, bilateral basilar subsegmental atelectasis.  Patient is admitted to telemetry bed as inpatient.  Dr. Amada Jupiter of neurology is consulted - MRI brain and cspine w/wo. Treating UTI to cover pseudonomas.  04/18: MRI brain and c-spine (+)Multiple bilateral periventricular white matter lesions c/w chronic demyelination, worse vs 04/22/2010. No contrast-enhancing lesions to indicate active demyelination in brain or c-spine --> avoid steroids, continue abx, PT. Neuro s/o. UCx (+)GNR 04/19: awaiting UCx ID/susceptibilities. Blood Cx NGx2d.  04/20: UCx (+)Klebsiella and Proteus, transitioned to po abx based on susceptibilities. Pt strength is improving today, stable for discharge   Consultants:  Neurology  Procedures: none      ASSESSMENT & PLAN:   Principal Problem:   Complicated UTI (urinary tract infection) Active Problems:   Sepsis   Acute metabolic encephalopathy   Multiple sclerosis   Weakness of upper extremity   Depression   Chronic diastolic CHF (congestive heart failure)   Constipation   Obesity (BMI 30-39.9)   Sepsis due to complicated UTI (urinary tract infection): UCx (+)Klebsiella, Proteus criteria for sepsis with WBC 13.2 and heart rate 104 --> no longer meeting criteria Patient had altered mental status which is resolved  Complete course amoxicillin   Acute metabolic  encephalopathy: improved Likely due to UTI.   MRI and CT head negative. Frequent neurocheck can d/c Neurology s/o   Multiple sclerosis and weakness of upper extremity_left arm:  MRI of brain and C-spine not showing active demyelination but MRI brain is showing  worsening compared to previous imaging Per neuro "suspect this represents unmasking of her underlying deficits in the setting of infection as opposed to acute flare" Appreciate neurology recs: avoid steroids, continue abx Continue home amantadine, baclofen, Neurontin   Constipation Senokot --> increased to 2 tabs bid for now also add miralax for now   Depression continue home medications   Chronic diastolic CHF (congestive heart failure) CHF seems to be compensated.   2D echo on 09/03/2021 showed EF of 60 to 65% with grade 1 diastolic dysfunction. Watch volume status closely   Obesity (BMI 30-39.9): Body weight 99.7 kg, BMI 37.58. Healthy diet Encourage exercise as able Encourage losing weight  Dysphagia / GLobus sensation Outpatient f/u suspect need EGD  Started PPI         Discharge Instructions  Allergies as of 01/14/2023       Reactions   Latex Swelling, Other (See Comments)   Reaction:  Facial swelling         Medication List     TAKE these medications    acetaminophen 500 MG tablet Commonly known as: TYLENOL Take 500 mg by mouth every 6 (six) hours as needed.   amantadine 100 MG capsule Commonly known as: SYMMETREL Take 200 mg by mouth 2 (two) times daily.   baclofen 20 MG tablet Commonly known as: LIORESAL Take 40 mg by mouth 2 (two) times daily.   cephALEXin 500 MG capsule Commonly known as: KEFLEX Take 1 capsule (500 mg total) by mouth every 12 (twelve) hours for 7 days.   cholecalciferol 1000 units tablet Commonly known as: VITAMIN D Take 1,000 Units by mouth daily.   clotrimazole 10 MG troche Commonly known as: MYCELEX Take 10 mg by mouth 5 (five) times daily.   Cranberry 400 MG Caps Take 400 mg by mouth daily.   DULoxetine 60 MG capsule Commonly known as: CYMBALTA Take 60 mg by mouth daily.   gabapentin 100 MG capsule Commonly known as: NEURONTIN Take 200 mg by mouth every morning.   gabapentin 800 MG tablet Commonly known  as: NEURONTIN Take 800 mg by mouth at bedtime.   Ozempic (1 MG/DOSE) 4 MG/3ML Sopn Generic drug: Semaglutide (1 MG/DOSE) Inject 1 mg into the skin once a week. On Monday   pantoprazole 40 MG tablet Commonly known as: PROTONIX Take 1 tablet (40 mg total) by mouth 2 (two) times daily.   potassium chloride 20 MEQ packet Commonly known as: KLOR-CON Take 40 mEq by mouth 2 (two) times daily.   senna 8.6 MG tablet Commonly known as: SENOKOT Take 2 tablets by mouth 2 (two) times daily.   Stimulant Laxative 8.6-50 MG tablet Generic drug: senna-docusate Take 1 tablet by mouth at bedtime.          Allergies  Allergen Reactions   Latex Swelling and Other (See Comments)    Reaction:  Facial swelling      Subjective: pt reports feeling stronger today, L hand/arm use about normal, no other concners    Discharge Exam: BP (!) 134/52 (BP Location: Right Arm)   Pulse 64   Temp 97.7 F (36.5 C)   Resp 18   Ht 5\' 4"  (1.626 m)   Wt 91.6 kg   SpO2 100%  BMI 34.66 kg/m  General: Pt is alert, awake, not in acute distress Cardiovascular: RRR, S1/S2, no rubs, no gallops Respiratory: CTA bilaterally, no wheezing, no rhonchi Abdominal: Soft, NT, ND, bowel sounds WNL Extremities: no edema, no cyanosis     The results of significant diagnostics from this hospitalization (including imaging, microbiology, ancillary and laboratory) are listed below for reference.     Microbiology: Recent Results (from the past 240 hour(s))  Blood culture (routine x 2)     Status: None (Preliminary result)   Collection Time: 01/11/23  3:15 AM   Specimen: BLOOD RIGHT ARM  Result Value Ref Range Status   Specimen Description BLOOD RIGHT ARM  Final   Special Requests   Final    BOTTLES DRAWN AEROBIC ONLY Blood Culture results may not be optimal due to an inadequate volume of blood received in culture bottles   Culture   Final    NO GROWTH 3 DAYS Performed at [facility], [address] [place], [place] [address]    Report Status PENDING  Incomplete  Blood culture (routine x 2)     Status: None (Preliminary result)   Collection Time: 01/11/23  3:15 AM   Specimen: BLOOD LEFT HAND  Result Value Ref Range Status   Specimen Description BLOOD LEFT HAND  Final   Special Requests   Final    BOTTLES DRAWN AEROBIC ONLY Blood Culture results may not be optimal due to an inadequate volume of blood received in culture bottles   Culture   Final    NO GROWTH 3 DAYS Performed at [facility], [address], [place], [place] [address]    Report Status PENDING  Incomplete  Resp panel by RT-PCR (RSV, Flu A&B, Covid) Anterior Nasal Swab     Status: None   Collection Time: 01/11/23  4:10 AM   Specimen: Anterior Nasal Swab  Result Value Ref Range Status   SARS Coronavirus 2 by RT PCR NEGATIVE NEGATIVE Final    Comment: (NOTE) SARS-CoV-2 target nucleic acids are NOT DETECTED.  The SARS-CoV-2 RNA is generally detectable in upper respiratory specimens during the acute phase of infection. The lowest concentration of SARS-CoV-2 viral copies this assay can detect is 138 copies/mL. A negative result does not preclude SARS-Cov-2 infection and should not be used as the sole basis for treatment or other patient management decisions. A negative result may occur with  improper specimen collection/handling, submission of specimen other than nasopharyngeal swab, presence of viral mutation(s) within the areas targeted by this assay, and inadequate number of viral copies(<138 copies/mL). A negative result must be combined with clinical observations, patient history, and epidemiological information. The expected result is Negative.  Fact Sheet for Patients:  BloggerCourse.com  Fact Sheet for Healthcare Providers:  SeriousBroker.it  This test is no t yet approved or cleared by the [place] FDA and  has been authorized for  detection and/or diagnosis of SARS-CoV-2 by FDA under an Emergency Use Authorization (EUA). This EUA will remain  in effect (meaning this test can be used) for the duration of the COVID-19 declaration under Section 564(b)(1) of the Act, 21 U.S.C.section 360bbb-3(b)(1), unless the authorization is terminated  or revoked sooner.       Influenza A by PCR NEGATIVE NEGATIVE Final   Influenza B by PCR NEGATIVE NEGATIVE Final    Comment: (NOTE) The Xpert Xpress SARS-CoV-2/FLU/RSV plus assay is intended as an aid in the diagnosis of influenza from Nasopharyngeal swab specimens and should not be  used as a sole basis for treatment. Nasal washings and aspirates are unacceptable for Xpert Xpress SARS-CoV-2/FLU/RSV testing.  Fact Sheet for Patients: BloggerCourse.com  Fact Sheet for Healthcare Providers: SeriousBroker.it  This test is not yet approved or cleared by the [place] FDA and has been authorized for detection and/or diagnosis of SARS-CoV-2 by FDA under an Emergency Use Authorization (EUA). This EUA will remain in effect (meaning this test can be used) for the duration of the COVID-19 declaration under Section 564(b)(1) of the Act, 21 U.S.C. section 360bbb-3(b)(1), unless the authorization is terminated or revoked.     Resp Syncytial Virus by PCR NEGATIVE NEGATIVE Final    Comment: (NOTE) Fact Sheet for Patients: BloggerCourse.com  Fact Sheet for Healthcare Providers: SeriousBroker.it  This test is not yet approved or cleared by the [place] FDA and has been authorized for detection and/or diagnosis of SARS-CoV-2 by FDA under an Emergency Use Authorization (EUA). This EUA will remain in effect (meaning this test can be used) for the duration of the COVID-19 declaration under Section 564(b)(1) of the Act, 21 U.S.C. section 360bbb-3(b)(1), unless the authorization is  terminated or revoked.  Performed at [facility], [address] [place], [place] [address]   Urine Culture     Status: Abnormal   Collection Time: 01/11/23  4:56 AM   Specimen: Urine, Random  Result Value Ref Range Status   Specimen Description   Final    URINE, RANDOM Performed at [facility], [address] [place], [place] [address]    Special Requests   Final    NONE Performed at [facility], [address] [place], [place] [address]    Culture (A)  Final    >=100,000 COLONIES/mL KLEBSIELLA PNEUMONIAE >=100,000 COLONIES/mL PROTEUS MIRABILIS    Report Status 01/14/2023 FINAL  Final   Organism ID, Bacteria KLEBSIELLA PNEUMONIAE (A)  Final   Organism ID, Bacteria PROTEUS MIRABILIS (A)  Final      Susceptibility   Klebsiella pneumoniae - MIC*    AMPICILLIN RESISTANT Resistant     CEFAZOLIN <=4 SENSITIVE Sensitive     CEFEPIME <=0.12 SENSITIVE Sensitive     CEFTRIAXONE <=0.25 SENSITIVE Sensitive     CIPROFLOXACIN <=0.25 SENSITIVE Sensitive     GENTAMICIN <=1 SENSITIVE Sensitive     IMIPENEM <=0.25 SENSITIVE Sensitive     NITROFURANTOIN 64 INTERMEDIATE Intermediate     TRIMETH/SULFA <=20 SENSITIVE Sensitive     AMPICILLIN/SULBACTAM 4 SENSITIVE Sensitive     PIP/TAZO <=4 SENSITIVE Sensitive     * >=100,000 COLONIES/mL KLEBSIELLA PNEUMONIAE   Proteus mirabilis - MIC*    AMPICILLIN <=2 SENSITIVE Sensitive     CEFAZOLIN 8 SENSITIVE Sensitive     CEFEPIME <=0.12 SENSITIVE Sensitive     CEFTRIAXONE <=0.25 SENSITIVE Sensitive     CIPROFLOXACIN <=0.25 SENSITIVE Sensitive     GENTAMICIN <=1 SENSITIVE Sensitive     IMIPENEM 2 SENSITIVE Sensitive     NITROFURANTOIN 128 RESISTANT Resistant     TRIMETH/SULFA <=20 SENSITIVE Sensitive     AMPICILLIN/SULBACTAM <=2 SENSITIVE Sensitive     PIP/TAZO <=4 SENSITIVE Sensitive     * >=100,000 COLONIES/mL PROTEUS MIRABILIS     Labs: BNP (last 3 results) Recent Labs    01/11/23 1351  BNP 97.6    Basic Metabolic Panel: Recent Labs  Lab 01/10/23 2101 01/12/23 0504 01/13/23 0513  NA 137 139 138  K 3.6 3.2* 3.9  CL 103 107 108  CO2 23 22 22   GLUCOSE  99 89 83  BUN CREATININE 0.46 0.42* 0.41*  CALCIUM 9.1 8.4* 8.8*   Liver Function Tests: Recent Labs  Lab 01/10/23 2101  AST 17  ALT 13  ALKPHOS 44  BILITOT 1.2  PROT 7.7  ALBUMIN 3.9   No results for input(s): "LIPASE", "AMYLASE" in the last 168 hours. No results for input(s): "AMMONIA" in the last 168 hours. CBC: Recent Labs  Lab 01/10/23 2101 01/12/23 0700  WBC 13.2* 12.1*  NEUTROABS 8.9*  --   HGB 12.5 11.7*  HCT 37.6 35.5*  MCV 90.6 91.5  PLT 303 244   Cardiac Enzymes: No results for input(s): "CKTOTAL", "CKMB", "CKMBINDEX", "TROPONINI" in the last 168 hours. BNP: Invalid input(s): "POCBNP" CBG: Recent Labs  Lab 01/12/23 0739 01/12/23 1136 01/12/23 2126 01/13/23 0752 01/14/23 0837  GLUCAP 91 92 82 82 113*   D-Dimer No results for input(s): "DDIMER" in the last 72 hours. Hgb A1c No results for input(s): "HGBA1C" in the last 72 hours. Lipid Profile No results for input(s): "CHOL", "HDL", "LDLCALC", "TRIG", "CHOLHDL", "LDLDIRECT" in the last 72 hours. Thyroid function studies No results for input(s): "TSH", "T4TOTAL", "T3FREE", "THYROIDAB" in the last 72 hours.  Invalid input(s): "FREET3" Anemia work up No results for input(s): "VITAMINB12", "FOLATE", "FERRITIN", "TIBC", "IRON", "RETICCTPCT" in the last 72 hours. Urinalysis    Component Value Date/Time   COLORURINE YELLOW (A) 01/11/2023 0456   APPEARANCEUR CLOUDY (A) 01/11/2023 0456   APPEARANCEUR Cloudy 10/05/2014 2100   LABSPEC 1.013 01/11/2023 0456   LABSPEC 1.009 10/05/2014 2100   PHURINE 7.0 01/11/2023 0456   GLUCOSEU NEGATIVE 01/11/2023 0456   GLUCOSEU Negative 10/05/2014 2100   HGBUR SMALL (A) 01/11/2023 0456   BILIRUBINUR NEGATIVE 01/11/2023 0456   BILIRUBINUR Negative 10/05/2014 2100   KETONESUR NEGATIVE 01/11/2023  0456   PROTEINUR 30 (A) 01/11/2023 0456   NITRITE NEGATIVE 01/11/2023 0456   LEUKOCYTESUR LARGE (A) 01/11/2023 0456   LEUKOCYTESUR 3+ 10/05/2014 2100   Sepsis Labs Recent Labs  Lab 01/10/23 2101 01/12/23 0700  WBC 13.2* 12.1*   Microbiology Recent Results (from the past 240 hour(s))  Blood culture (routine x 2)     Status: None (Preliminary result)   Collection Time: 01/11/23  3:15 AM   Specimen: BLOOD RIGHT ARM  Result Value Ref Range Status   Specimen Description BLOOD RIGHT ARM  Final   Special Requests   Final    BOTTLES DRAWN AEROBIC ONLY Blood Culture results may not be optimal due to an inadequate volume of blood received in culture bottles   Culture   Final    NO GROWTH 3 DAYS Performed at [facility], [address] [place], [place] [address]    Report Status PENDING  Incomplete  Blood culture (routine x 2)     Status: None (Preliminary result)   Collection Time: 01/11/23  3:15 AM   Specimen: BLOOD LEFT HAND  Result Value Ref Range Status   Specimen Description BLOOD LEFT HAND  Final   Special Requests   Final    BOTTLES DRAWN AEROBIC ONLY Blood Culture results may not be optimal due to an inadequate volume of blood received in culture bottles   Culture   Final    NO GROWTH 3 DAYS Performed at [facility], [address] [place], [place] [address]    Report Status PENDING  Incomplete  Resp panel by RT-PCR (RSV, Flu A&B, Covid) Anterior Nasal Swab     Status: None   Collection  Time: 01/11/23  4:10 AM   Specimen: Anterior Nasal Swab  Result Value Ref Range Status   SARS Coronavirus 2 by RT PCR NEGATIVE NEGATIVE Final    Comment: (NOTE) SARS-CoV-2 target nucleic acids are NOT DETECTED.  The SARS-CoV-2 RNA is generally detectable in upper respiratory specimens during the acute phase of infection. The lowest concentration of SARS-CoV-2 viral copies this assay can detect is 138 copies/mL. A negative result does not preclude  SARS-Cov-2 infection and should not be used as the sole basis for treatment or other patient management decisions. A negative result may occur with  improper specimen collection/handling, submission of specimen other than nasopharyngeal swab, presence of viral mutation(s) within the areas targeted by this assay, and inadequate number of viral copies(<138 copies/mL). A negative result must be combined with clinical observations, patient history, and epidemiological information. The expected result is Negative.  Fact Sheet for Patients:  BloggerCourse.com  Fact Sheet for Healthcare Providers:  SeriousBroker.it  This test is no t yet approved or cleared by the [place] FDA and  has been authorized for detection and/or diagnosis of SARS-CoV-2 by FDA under an Emergency Use Authorization (EUA). This EUA will remain  in effect (meaning this test can be used) for the duration of the COVID-19 declaration under Section 564(b)(1) of the Act, 21 U.S.C.section 360bbb-3(b)(1), unless the authorization is terminated  or revoked sooner.       Influenza A by PCR NEGATIVE NEGATIVE Final   Influenza B by PCR NEGATIVE NEGATIVE Final    Comment: (NOTE) The Xpert Xpress SARS-CoV-2/FLU/RSV plus assay is intended as an aid in the diagnosis of influenza from Nasopharyngeal swab specimens and should not be used as a sole basis for treatment. Nasal washings and aspirates are unacceptable for Xpert Xpress SARS-CoV-2/FLU/RSV testing.  Fact Sheet for Patients: BloggerCourse.com  Fact Sheet for Healthcare Providers: SeriousBroker.it  This test is not yet approved or cleared by the [place] FDA and has been authorized for detection and/or diagnosis of SARS-CoV-2 by FDA under an Emergency Use Authorization (EUA). This EUA will remain in effect (meaning this test can be used) for the duration of  the COVID-19 declaration under Section 564(b)(1) of the Act, 21 U.S.C. section 360bbb-3(b)(1), unless the authorization is terminated or revoked.     Resp Syncytial Virus by PCR NEGATIVE NEGATIVE Final    Comment: (NOTE) Fact Sheet for Patients: BloggerCourse.com  Fact Sheet for Healthcare Providers: SeriousBroker.it  This test is not yet approved or cleared by the [place] FDA and has been authorized for detection and/or diagnosis of SARS-CoV-2 by FDA under an Emergency Use Authorization (EUA). This EUA will remain in effect (meaning this test can be used) for the duration of the COVID-19 declaration under Section 564(b)(1) of the Act, 21 U.S.C. section 360bbb-3(b)(1), unless the authorization is terminated or revoked.  Performed at [facility], [address], [place], [place] [address]   Urine Culture     Status: Abnormal   Collection Time: 01/11/23  4:56 AM   Specimen: Urine, Random  Result Value Ref Range Status   Specimen Description   Final    URINE, RANDOM Performed at [facility], [address], [place], [place] [address]    Special Requests   Final    NONE Performed at [facility], [address] [place], [place] [address]    Culture (A)  Final    >=100,000 COLONIES/mL KLEBSIELLA PNEUMONIAE >=100,000 COLONIES/mL PROTEUS MIRABILIS    Report Status 01/14/2023 FINAL  Final   Organism ID, Bacteria KLEBSIELLA PNEUMONIAE (A)  Final   Organism ID, Bacteria PROTEUS MIRABILIS (A)  Final      Susceptibility   Klebsiella pneumoniae - MIC*    AMPICILLIN RESISTANT Resistant     CEFAZOLIN <=4 SENSITIVE Sensitive     CEFEPIME <=0.12 SENSITIVE Sensitive     CEFTRIAXONE <=0.25 SENSITIVE Sensitive     CIPROFLOXACIN <=0.25 SENSITIVE Sensitive     GENTAMICIN <=1 SENSITIVE Sensitive     IMIPENEM <=0.25 SENSITIVE Sensitive     NITROFURANTOIN 64 INTERMEDIATE Intermediate     TRIMETH/SULFA  <=20 SENSITIVE Sensitive     AMPICILLIN/SULBACTAM 4 SENSITIVE Sensitive     PIP/TAZO <=4 SENSITIVE Sensitive     * >=100,000 COLONIES/mL KLEBSIELLA PNEUMONIAE   Proteus mirabilis - MIC*    AMPICILLIN <=2 SENSITIVE Sensitive     CEFAZOLIN 8 SENSITIVE Sensitive     CEFEPIME <=0.12 SENSITIVE Sensitive     CEFTRIAXONE <=0.25 SENSITIVE Sensitive     CIPROFLOXACIN <=0.25 SENSITIVE Sensitive     GENTAMICIN <=1 SENSITIVE Sensitive     IMIPENEM 2 SENSITIVE Sensitive     NITROFURANTOIN 128 RESISTANT Resistant     TRIMETH/SULFA <=20 SENSITIVE Sensitive     AMPICILLIN/SULBACTAM <=2 SENSITIVE Sensitive     PIP/TAZO <=4 SENSITIVE Sensitive     * >=100,000 COLONIES/mL PROTEUS MIRABILIS   Imaging MR BRAIN W WO CONTRAST  Result Date: 01/12/2023 CLINICAL DATA:  Multiple sclerosis EXAM: MRI HEAD WITHOUT AND WITH CONTRAST MRI CERVICAL SPINE WITHOUT AND WITH CONTRAST TECHNIQUE: Multiplanar, multiecho pulse sequences of the brain and surrounding structures, and cervical spine, to include the craniocervical junction and cervicothoracic junction, were obtained without and with intravenous contrast. CONTRAST:  10mL GADAVIST GADOBUTROL 1 MMOL/ML IV SOLN COMPARISON:  Brain MRI 04/22/2010 FINDINGS: MRI HEAD FINDINGS Brain: No acute infarct, mass effect or extra-axial collection. No chronic microhemorrhage or siderosis. Multiple bilateral periventricular white matter lesions in a pattern consistent with chronic demyelination. There are also lesions of both middle cerebellar peduncles. Mild T2 hyperintensity within the midbrain and periaqueductal gray matter. These findings have worsened compared to 04/22/2010. There are no contrast-enhancing lesions. Slow growth of 6 mm left anterior meningioma. (Series 23 image 114). The midline structures are normal. Vascular: Normal flow voids. Skull and upper cervical spine: Normal marrow signal. Sinuses/Orbits: Negative. Other: None. MRI CERVICAL SPINE FINDINGS Alignment: Physiologic.  Vertebrae: No fracture, evidence of discitis, or bone lesion. Cord: Motion degraded axial images but no visible demyelinating lesions. Posterior Fossa, vertebral arteries, paraspinal tissues: Negative. Disc levels: C1-2: Unremarkable. C2-3: Normal disc space and facet joints. There is no spinal canal stenosis. No neural foraminal stenosis. C3-4: Small central disc protrusion and left facet hypertrophy. There is no spinal canal stenosis. Mild right and moderate left neural foraminal stenosis. C4-5: Small disc bulge. There is no spinal canal stenosis. No neural foraminal stenosis. C5-6: Small disc bulge. There is no spinal canal stenosis. No neural foraminal stenosis. C6-7: Small right subarticular disc protrusion. There is no spinal canal stenosis. No neural foraminal stenosis. C7-T1: Normal disc space and facet joints. There is no spinal canal stenosis. No neural foraminal stenosis. IMPRESSION: 1. Multiple bilateral periventricular white matter lesions in a pattern consistent with chronic demyelination, worsened compared to 04/22/2010. No contrast-enhancing lesions to indicate active demyelination. 2. Slow growth of 6 mm left anterior meningioma. 3. Motion degraded cervical spine images without visible cervical spine demyelinating lesions. 4. Mild cervical degenerative disc disease without spinal canal stenosis. Mild right and moderate left  neural foraminal stenosis at C3-4. Electronically Signed   By: Deatra Robinson M.D.   On: 01/12/2023 00:25   MR CERVICAL SPINE W WO CONTRAST  Result Date: 01/12/2023 CLINICAL DATA:  Multiple sclerosis EXAM: MRI HEAD WITHOUT AND WITH CONTRAST MRI CERVICAL SPINE WITHOUT AND WITH CONTRAST TECHNIQUE: Multiplanar, multiecho pulse sequences of the brain and surrounding structures, and cervical spine, to include the craniocervical junction and cervicothoracic junction, were obtained without and with intravenous contrast. CONTRAST:  10mL GADAVIST GADOBUTROL 1 MMOL/ML IV SOLN  COMPARISON:  Brain MRI 04/22/2010 FINDINGS: MRI HEAD FINDINGS Brain: No acute infarct, mass effect or extra-axial collection. No chronic microhemorrhage or siderosis. Multiple bilateral periventricular white matter lesions in a pattern consistent with chronic demyelination. There are also lesions of both middle cerebellar peduncles. Mild T2 hyperintensity within the midbrain and periaqueductal gray matter. These findings have worsened compared to 04/22/2010. There are no contrast-enhancing lesions. Slow growth of 6 mm left anterior meningioma. (Series 23 image 114). The midline structures are normal. Vascular: Normal flow voids. Skull and upper cervical spine: Normal marrow signal. Sinuses/Orbits: Negative. Other: None. MRI CERVICAL SPINE FINDINGS Alignment: Physiologic. Vertebrae: No fracture, evidence of discitis, or bone lesion. Cord: Motion degraded axial images but no visible demyelinating lesions. Posterior Fossa, vertebral arteries, paraspinal tissues: Negative. Disc levels: C1-2: Unremarkable. C2-3: Normal disc space and facet joints. There is no spinal canal stenosis. No neural foraminal stenosis. C3-4: Small central disc protrusion and left facet hypertrophy. There is no spinal canal stenosis. Mild right and moderate left neural foraminal stenosis. C4-5: Small disc bulge. There is no spinal canal stenosis. No neural foraminal stenosis. C5-6: Small disc bulge. There is no spinal canal stenosis. No neural foraminal stenosis. C6-7: Small right subarticular disc protrusion. There is no spinal canal stenosis. No neural foraminal stenosis. C7-T1: Normal disc space and facet joints. There is no spinal canal stenosis. No neural foraminal stenosis. IMPRESSION: 1. Multiple bilateral periventricular white matter lesions in a pattern consistent with chronic demyelination, worsened compared to 04/22/2010. No contrast-enhancing lesions to indicate active demyelination. 2. Slow growth of 6 mm left anterior meningioma. 3.  Motion degraded cervical spine images without visible cervical spine demyelinating lesions. 4. Mild cervical degenerative disc disease without spinal canal stenosis. Mild right and moderate left neural foraminal stenosis at C3-4. Electronically Signed   By: Deatra Robinson M.D.   On: 01/12/2023 00:25   DG Chest Port 1 View  Result Date: 01/11/2023 CLINICAL DATA:  52 year old female with possible sepsis. EXAM: PORTABLE CHEST 1 VIEW COMPARISON:  Chest x-ray 12/03/2021. FINDINGS: Lung volumes are exceedingly low. Bibasilar opacities likely reflect areas of atelectasis. No definite pleural effusions. No pneumothorax. No evidence of pulmonary edema. Heart size is normal. Upper mediastinal contours are within normal limits allowing for low lung volumes and patient positioning. IMPRESSION: 1. Low lung volumes with probable bibasilar subsegmental atelectasis. Electronically Signed   By: Trudie Reed M.D.   On: 01/11/2023 05:40   CT Head Wo Contrast  Result Date: 01/11/2023 CLINICAL DATA:  Left upper extremity weakness, AMS EXAM: CT HEAD WITHOUT CONTRAST TECHNIQUE: Contiguous axial images were obtained from the base of the skull through the vertex without intravenous contrast. RADIATION DOSE REDUCTION: This exam was performed according to the departmental dose-optimization program which includes automated exposure control, adjustment of the mA and/or kV according to patient size and/or use of iterative reconstruction technique. COMPARISON:  04/10/2017 FINDINGS: Brain: No evidence of acute infarction, hemorrhage, hydrocephalus, extra-axial collection or mass lesion/mass effect. Vascular: No hyperdense  vessel or unexpected calcification. Skull: Normal. Negative for fracture or focal lesion. Sinuses/Orbits: The visualized paranasal sinuses are essentially clear. The mastoid air cells are unopacified. Other: None. IMPRESSION: Normal head CT. Electronically Signed   By: Charline Bills M.D.   On: 01/11/2023 02:54       Time coordinating discharge: over 30 minutes  SIGNED:  Sunnie Nielsen DO [facility]

## 2023-01-16 LAB — CULTURE, BLOOD (ROUTINE X 2)
Culture: NO GROWTH
Culture: NO GROWTH

## 2023-01-17 DIAGNOSIS — Z09 Encounter for follow-up examination after completed treatment for conditions other than malignant neoplasm: Principal | ICD-10-CM

## 2023-01-20 ENCOUNTER — Ambulatory Visit: Admit: 2023-01-20 | Discharge: 2023-01-20 | Payer: MEDICARE

## 2023-01-20 DIAGNOSIS — N6459 Other signs and symptoms in breast: Principal | ICD-10-CM

## 2023-01-26 ENCOUNTER — Ambulatory Visit: Admit: 2023-01-26 | Discharge: 2023-01-27 | Payer: MEDICARE

## 2023-01-26 DIAGNOSIS — R1310 Dysphagia, unspecified: Principal | ICD-10-CM

## 2023-01-26 DIAGNOSIS — Z09 Encounter for follow-up examination after completed treatment for conditions other than malignant neoplasm: Principal | ICD-10-CM

## 2023-01-26 DIAGNOSIS — E876 Hypokalemia: Principal | ICD-10-CM

## 2023-01-31 ENCOUNTER — Ambulatory Visit: Admit: 2023-01-31 | Discharge: 2023-02-01 | Payer: MEDICARE

## 2023-01-31 DIAGNOSIS — K5900 Constipation, unspecified: Principal | ICD-10-CM

## 2023-01-31 DIAGNOSIS — G35 Multiple sclerosis: Principal | ICD-10-CM

## 2023-01-31 DIAGNOSIS — K592 Neurogenic bowel, not elsewhere classified: Principal | ICD-10-CM

## 2023-01-31 MED ORDER — AMANTADINE HCL 100 MG CAPSULE
ORAL_CAPSULE | ORAL | 1 refills | 0 days
Start: 2023-01-31 — End: ?

## 2023-01-31 MED ORDER — BACLOFEN 20 MG TABLET
ORAL_TABLET | Freq: Four times a day (QID) | ORAL | 1 refills | 0 days
Start: 2023-01-31 — End: ?

## 2023-02-02 MED ORDER — AMANTADINE HCL 100 MG CAPSULE
ORAL_CAPSULE | ORAL | 1 refills | 0 days | Status: CP
Start: 2023-02-02 — End: ?

## 2023-02-02 MED ORDER — BACLOFEN 20 MG TABLET
ORAL_TABLET | Freq: Four times a day (QID) | ORAL | 1 refills | 90 days | Status: CP
Start: 2023-02-02 — End: ?

## 2023-02-15 DIAGNOSIS — G35 Multiple sclerosis: Principal | ICD-10-CM

## 2023-02-15 MED ORDER — GABAPENTIN 100 MG CAPSULE
ORAL_CAPSULE | 1 refills | 0 days
Start: 2023-02-15 — End: ?

## 2023-02-15 MED ORDER — GABAPENTIN 800 MG TABLET
ORAL_TABLET | 1 refills | 0 days
Start: 2023-02-15 — End: ?

## 2023-02-16 MED ORDER — GABAPENTIN 100 MG CAPSULE
ORAL_CAPSULE | 0 refills | 0 days | Status: CP
Start: 2023-02-16 — End: ?

## 2023-02-16 MED ORDER — GABAPENTIN 800 MG TABLET
ORAL_TABLET | 0 refills | 0 days | Status: CP
Start: 2023-02-16 — End: ?

## 2023-02-22 ENCOUNTER — Telehealth
Admit: 2023-02-22 | Discharge: 2023-02-23 | Payer: MEDICARE | Attending: Physician Assistant | Primary: Physician Assistant

## 2023-02-22 MED ORDER — GABAPENTIN 100 MG CAPSULE
ORAL_CAPSULE | 1 refills | 0 days | Status: CP
Start: 2023-02-22 — End: ?

## 2023-02-22 MED ORDER — AMANTADINE HCL 100 MG CAPSULE
ORAL_CAPSULE | Freq: Two times a day (BID) | ORAL | 1 refills | 90 days | Status: CP
Start: 2023-02-22 — End: ?

## 2023-02-22 MED ORDER — BACLOFEN 20 MG TABLET
ORAL_TABLET | Freq: Four times a day (QID) | ORAL | 1 refills | 90 days | Status: CP
Start: 2023-02-22 — End: ?

## 2023-02-22 MED ORDER — GABAPENTIN 800 MG TABLET
ORAL_TABLET | 1 refills | 0 days | Status: CP
Start: 2023-02-22 — End: ?

## 2023-02-27 ENCOUNTER — Ambulatory Visit: Admit: 2023-02-27 | Discharge: 2023-02-28 | Payer: MEDICARE

## 2023-02-27 DIAGNOSIS — H9202 Otalgia, left ear: Principal | ICD-10-CM

## 2023-02-27 DIAGNOSIS — R1310 Dysphagia, unspecified: Principal | ICD-10-CM

## 2023-02-27 MED ORDER — PANTOPRAZOLE 40 MG TABLET,DELAYED RELEASE
ORAL_TABLET | Freq: Every day | ORAL | 0 refills | 90 days | Status: CP
Start: 2023-02-27 — End: ?

## 2023-03-07 ENCOUNTER — Institutional Professional Consult (permissible substitution): Admit: 2023-03-07 | Discharge: 2023-03-08 | Payer: MEDICARE

## 2023-03-10 ENCOUNTER — Ambulatory Visit: Admit: 2023-03-10 | Discharge: 2023-03-10 | Disposition: A | Discharge status: Home / Self Care | Payer: MEDICARE

## 2023-03-10 ENCOUNTER — Emergency Department
Admit: 2023-03-10 | Discharge: 2023-03-11 | Disposition: A | Discharge status: Home / Self Care | Payer: MEDICARE | Attending: Emergency Medicine

## 2023-03-10 ENCOUNTER — Ambulatory Visit
Admit: 2023-03-10 | Discharge: 2023-03-11 | Disposition: A | Discharge status: Home / Self Care | Payer: MEDICARE | Attending: Emergency Medicine

## 2023-03-13 ENCOUNTER — Encounter: Admit: 2023-03-13 | Discharge: 2023-03-13 | Payer: MEDICARE | Attending: Registered Nurse | Primary: Registered Nurse

## 2023-03-13 ENCOUNTER — Ambulatory Visit: Admit: 2023-03-13 | Discharge: 2023-03-13 | Payer: MEDICARE

## 2023-03-13 DIAGNOSIS — L89309 Pressure ulcer of unspecified buttock, unspecified stage: Principal | ICD-10-CM

## 2023-03-13 MED ORDER — SULFAMETHOXAZOLE 800 MG-TRIMETHOPRIM 160 MG TABLET
ORAL_TABLET | Freq: Two times a day (BID) | ORAL | 0 refills | 7 days | Status: CP
Start: 2023-03-13 — End: 2023-03-20

## 2023-03-14 ENCOUNTER — Ambulatory Visit: Admit: 2023-03-14 | Discharge: 2023-03-15 | Payer: MEDICARE

## 2023-03-14 DIAGNOSIS — G35 Multiple sclerosis: Principal | ICD-10-CM

## 2023-03-14 DIAGNOSIS — L89323 Pressure ulcer of left buttock, stage 3: Principal | ICD-10-CM

## 2023-03-14 DIAGNOSIS — G8220 Paraplegia, unspecified: Principal | ICD-10-CM

## 2023-03-15 ENCOUNTER — Emergency Department: Payer: 59

## 2023-03-15 ENCOUNTER — Observation Stay
Admission: EM | Admit: 2023-03-15 | Discharge: 2023-03-16 | Disposition: A | Discharge status: Home Health | Payer: 59 | Attending: Obstetrics and Gynecology | Admitting: Obstetrics and Gynecology

## 2023-03-15 ENCOUNTER — Other Ambulatory Visit: Payer: Self-pay

## 2023-03-15 DIAGNOSIS — L89152 Pressure ulcer of sacral region, stage 2: Secondary | ICD-10-CM

## 2023-03-15 DIAGNOSIS — I5032 Chronic diastolic (congestive) heart failure: Secondary | ICD-10-CM | POA: Diagnosis not present

## 2023-03-15 DIAGNOSIS — N319 Neuromuscular dysfunction of bladder, unspecified: Secondary | ICD-10-CM | POA: Diagnosis not present

## 2023-03-15 DIAGNOSIS — L89153 Pressure ulcer of sacral region, stage 3: Principal | ICD-10-CM

## 2023-03-15 DIAGNOSIS — I700 Atherosclerosis of aorta: Secondary | ICD-10-CM | POA: Insufficient documentation

## 2023-03-15 DIAGNOSIS — G8220 Paraplegia, unspecified: Secondary | ICD-10-CM | POA: Diagnosis not present

## 2023-03-15 DIAGNOSIS — K8020 Calculus of gallbladder without cholecystitis without obstruction: Secondary | ICD-10-CM | POA: Diagnosis not present

## 2023-03-15 DIAGNOSIS — R8271 Bacteriuria: Secondary | ICD-10-CM | POA: Diagnosis not present

## 2023-03-15 DIAGNOSIS — Z79899 Other long term (current) drug therapy: Secondary | ICD-10-CM | POA: Insufficient documentation

## 2023-03-15 DIAGNOSIS — M792 Neuralgia and neuritis, unspecified: Secondary | ICD-10-CM | POA: Diagnosis present

## 2023-03-15 DIAGNOSIS — L03317 Cellulitis of buttock: Secondary | ICD-10-CM

## 2023-03-15 DIAGNOSIS — E669 Obesity, unspecified: Secondary | ICD-10-CM | POA: Diagnosis present

## 2023-03-15 DIAGNOSIS — G35 Multiple sclerosis: Secondary | ICD-10-CM | POA: Diagnosis present

## 2023-03-15 DIAGNOSIS — Z91040 Latex allergy status: Secondary | ICD-10-CM | POA: Insufficient documentation

## 2023-03-15 DIAGNOSIS — L0390 Cellulitis, unspecified: Secondary | ICD-10-CM | POA: Diagnosis present

## 2023-03-15 LAB — COMPREHENSIVE METABOLIC PANEL
ALT: 11 U/L (ref 0–44)
AST: 21 U/L (ref 15–41)
Albumin: 3.6 g/dL (ref 3.5–5.0)
Alkaline Phosphatase: 45 U/L (ref 38–126)
Anion gap: 13 (ref 5–15)
BUN: 11 mg/dL (ref 6–20)
CO2: 20 mmol/L — ABNORMAL LOW (ref 22–32)
Calcium: 8.3 mg/dL — ABNORMAL LOW (ref 8.9–10.3)
Chloride: 100 mmol/L (ref 98–111)
Creatinine, Ser: 0.61 mg/dL (ref 0.44–1.00)
GFR, Estimated: >60 mL/min (ref >60)
Glucose, Bld: 90 mg/dL (ref 70–99)
Potassium: 4.4 mmol/L (ref 3.5–5.1)
Sodium: 133 mmol/L — ABNORMAL LOW (ref 135–145)
Total Bilirubin: 1.6 mg/dL — ABNORMAL HIGH (ref 0.3–1.2)
Total Protein: 7.6 g/dL (ref 6.5–8.1)

## 2023-03-15 LAB — CBC WITH DIFFERENTIAL/PLATELET
Abs Immature Granulocytes: 0.04 10*3/uL (ref 0.00–0.07)
Basophils Absolute: 0 10*3/uL (ref 0.0–0.1)
Basophils Relative: 0 %
Eosinophils Absolute: 0.1 10*3/uL (ref 0.0–0.5)
Eosinophils Relative: 1 %
HCT: 40.9 % (ref 36.0–46.0)
Hemoglobin: 13.6 g/dL (ref 12.0–15.0)
Immature Granulocytes: 0 %
Lymphocytes Relative: 10 %
Lymphs Abs: 0.9 10*3/uL (ref 0.7–4.0)
MCH: 29.8 pg (ref 26.0–34.0)
MCHC: 33.3 g/dL (ref 30.0–36.0)
MCV: 89.5 fL (ref 80.0–100.0)
Monocytes Absolute: 0.7 10*3/uL (ref 0.1–1.0)
Monocytes Relative: 7 %
Neutro Abs: 7.9 10*3/uL — ABNORMAL HIGH (ref 1.7–7.7)
Neutrophils Relative %: 82 %
Platelets: 344 10*3/uL (ref 150–400)
RBC: 4.57 MIL/uL (ref 3.87–5.11)
RDW: 13.4 % (ref 11.5–15.5)
WBC: 9.6 10*3/uL (ref 4.0–10.5)
nRBC: 0 % (ref 0.0–0.2)

## 2023-03-15 LAB — URINALYSIS, W/ REFLEX TO CULTURE (INFECTION SUSPECTED)
Bilirubin Urine: NEGATIVE
Glucose, UA: NEGATIVE mg/dL
Ketones, ur: 20 mg/dL — AB
Nitrite: NEGATIVE
Protein, ur: NEGATIVE mg/dL
Specific Gravity, Urine: 1.012 (ref 1.005–1.030)
Squamous Epithelial / HPF: NONE SEEN /HPF (ref 0–5)
pH: 7 (ref 5.0–8.0)

## 2023-03-15 LAB — URINE CULTURE

## 2023-03-15 LAB — PROTIME-INR
INR: 1.2 (ref 0.8–1.2)
Prothrombin Time: 15.2 seconds (ref 11.4–15.2)

## 2023-03-15 LAB — LACTIC ACID, PLASMA
Lactic Acid, Venous: 0.8 mmol/L (ref 0.5–1.9)
Lactic Acid, Venous: 1.4 mmol/L (ref 0.5–1.9)

## 2023-03-15 LAB — CULTURE, BLOOD (ROUTINE X 2): Culture: NO GROWTH

## 2023-03-15 LAB — APTT: aPTT: 30 seconds (ref 24–36)

## 2023-03-15 MED ORDER — ACETAMINOPHEN 500 MG PO TABS
1000.0000 mg | ORAL_TABLET | Freq: Once | ORAL | Status: AC
  Administered 2023-03-15: 1000 mg via ORAL
  Filled 2023-03-15: qty 2

## 2023-03-15 MED ORDER — SENNA 8.6 MG PO TABS
2.0000 | ORAL_TABLET | Freq: Two times a day (BID) | ORAL | Status: DC
  Administered 2023-03-15 – 2023-03-16 (×2): 17.2 mg via ORAL
  Filled 2023-03-15 (×2): qty 2

## 2023-03-15 MED ORDER — DULOXETINE HCL 60 MG PO CPEP
60.0000 mg | ORAL_CAPSULE | Freq: Every day | ORAL | Status: DC
  Administered 2023-03-16: 60 mg via ORAL
  Filled 2023-03-15: qty 1
  Filled 2023-03-15: qty 2

## 2023-03-15 MED ORDER — BACLOFEN 10 MG PO TABS
40.0000 mg | ORAL_TABLET | Freq: Two times a day (BID) | ORAL | Status: DC
  Administered 2023-03-15 – 2023-03-16 (×2): 40 mg via ORAL
  Filled 2023-03-15 (×2): qty 4

## 2023-03-15 MED ORDER — GABAPENTIN 100 MG PO CAPS
200.0000 mg | ORAL_CAPSULE | Freq: Every morning | ORAL | Status: DC
  Administered 2023-03-16: 200 mg via ORAL
  Filled 2023-03-15: qty 2

## 2023-03-15 MED ORDER — ENOXAPARIN SODIUM 40 MG/0.4ML IJ SOSY
40.0000 mg | PREFILLED_SYRINGE | INTRAMUSCULAR | Status: DC
  Administered 2023-03-15: 40 mg via SUBCUTANEOUS
  Filled 2023-03-15: qty 0.4

## 2023-03-15 MED ORDER — LACTATED RINGERS IV BOLUS (SEPSIS)
1000.0000 mL | Freq: Once | INTRAVENOUS | Status: AC
  Administered 2023-03-15: 1000 mL via INTRAVENOUS

## 2023-03-15 MED ORDER — SODIUM CHLORIDE 0.9 % IV SOLN
1.0000 g | Freq: Once | INTRAVENOUS | Status: AC
  Administered 2023-03-15: 1 g via INTRAVENOUS
  Filled 2023-03-15: qty 10

## 2023-03-15 MED ORDER — SODIUM CHLORIDE 0.9% FLUSH
3.0000 mL | Freq: Two times a day (BID) | INTRAVENOUS | Status: DC
  Administered 2023-03-15 – 2023-03-16 (×2): 3 mL via INTRAVENOUS

## 2023-03-15 MED ORDER — DOXYCYCLINE HYCLATE 100 MG PO TABS
100.0000 mg | ORAL_TABLET | Freq: Two times a day (BID) | ORAL | Status: DC
  Administered 2023-03-15 – 2023-03-16 (×2): 100 mg via ORAL
  Filled 2023-03-15 (×2): qty 1

## 2023-03-15 MED ORDER — POLYETHYLENE GLYCOL 3350 17 G PO PACK
34.0000 g | PACK | Freq: Every day | ORAL | Status: DC
  Administered 2023-03-15 – 2023-03-16 (×2): 34 g via ORAL
  Filled 2023-03-15 (×3): qty 2

## 2023-03-15 MED ORDER — AMANTADINE HCL 100 MG PO CAPS
200.0000 mg | ORAL_CAPSULE | Freq: Two times a day (BID) | ORAL | Status: DC
  Administered 2023-03-15 – 2023-03-16 (×2): 200 mg via ORAL
  Filled 2023-03-15 (×2): qty 2

## 2023-03-15 MED ORDER — SENNOSIDES-DOCUSATE SODIUM 8.6-50 MG PO TABS: 1.0000 | ORAL_TABLET | Freq: Every day | ORAL | Status: DC

## 2023-03-15 MED ORDER — IOHEXOL 300 MG/ML  SOLN
100.0000 mL | Freq: Once | INTRAMUSCULAR | Status: AC | PRN
  Administered 2023-03-15: 100 mL via INTRAVENOUS

## 2023-03-15 MED ORDER — SODIUM CHLORIDE 0.9 % IV SOLN: 250.0000 mL | INTRAVENOUS | Status: DC | PRN

## 2023-03-15 MED ORDER — GABAPENTIN 400 MG PO CAPS
800.0000 mg | ORAL_CAPSULE | Freq: Every day | ORAL | Status: DC
  Administered 2023-03-15: 800 mg via ORAL
  Filled 2023-03-15: qty 2

## 2023-03-15 MED ORDER — GABAPENTIN 100 MG PO CAPS
200.0000 mg | ORAL_CAPSULE | Freq: Once | ORAL | Status: AC
  Administered 2023-03-15: 200 mg via ORAL
  Filled 2023-03-15: qty 2

## 2023-03-15 MED ORDER — CEPHALEXIN 500 MG PO CAPS
500.0000 mg | ORAL_CAPSULE | Freq: Four times a day (QID) | ORAL | Status: DC
  Administered 2023-03-15 – 2023-03-16 (×3): 500 mg via ORAL
  Filled 2023-03-15 (×3): qty 1

## 2023-03-15 MED ORDER — VANCOMYCIN HCL IN DEXTROSE 1-5 GM/200ML-% IV SOLN
1000.0000 mg | Freq: Once | INTRAVENOUS | Status: AC
  Administered 2023-03-15: 1000 mg via INTRAVENOUS
  Filled 2023-03-15: qty 200

## 2023-03-15 MED ORDER — FLEET ENEMA 7-19 GM/118ML RE ENEM
1.0000 | ENEMA | Freq: Once | RECTAL | Status: AC
  Administered 2023-03-15: 1 via RECTAL

## 2023-03-15 MED ORDER — SODIUM CHLORIDE 0.9% FLUSH: 3.0000 mL | INTRAVENOUS | Status: DC | PRN

## 2023-03-15 NOTE — ED Triage Notes (Signed)
Per EMS pt is coming from home for generalized body aches, weakness, and fever. Pt has MS and sts yesterday she started an antibiotic for a decubiti to her sacaral regoin which was has been having greenish drainage.

## 2023-03-15 NOTE — Progress Notes (Addendum)
PHARMACY -  BRIEF ANTIBIOTIC NOTE   Pharmacy has received consult for vancomycin from an ED provider.  The patient's profile has been reviewed for ht/wt/allergies/indication/available labs.    One time order placed for vancomycin 1,000 mg x 1   Further antibiotics/pharmacy consults should be ordered by admitting physician if indicated.                       Thank you,   Elliot Gurney, PharmD, BCPS Clinical Pharmacist  03/15/2023 12:19 PM

## 2023-03-15 NOTE — H&P (Signed)
History and Physical    [name] ZOX:[id number] DOB: 09-26-1971 DOA: 03/15/2023  PCP: [name], MD  Patient coming from: home   Chief Complaint: decubutus ulcer  HPI: [name] is a 52 y.o. female with medical history significant for ms with paraplegia, hgn, neurogenic bladder w/ ileal conduit, presenting with the above.  Says has had ulcer on sacrum for some time, not sure, but worsening lately, more drainage. No pain, no fevers. Saw pcp who was concerned for infection so referred here. Not followed by wound center. Gets around with a power chair. Last BM last week, has chronic constipation  ED Course:   CT scan, labs.  Review of Systems: As per HPI otherwise 10 point review of systems negative.    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 She has never used smokeless tobacco. She reports current alcohol use. She reports that she does not use drugs.  Allergies  Allergen Reactions   Latex Swelling and Other (See Comments)    Reaction:  Facial swelling     Family History  Problem Relation Age of Onset   Osteoarthritis Mother    Hypertension Mother    Hyperlipidemia Mother    Thyroid disease Mother    Liver cancer Father    Asthma Brother     Prior to Admission medications   Medication Sig Start Date End Date Taking? Authorizing Provider  acetaminophen (TYLENOL) 500 MG tablet Take 500 mg by mouth every 6 (six) hours as needed.    [provider]  amantadine (SYMMETREL) 100 MG capsule Take 200 mg by mouth 2 (two) times daily.    [provider]  baclofen (LIORESAL) 20 MG tablet Take 40 mg by mouth 2 (two) times daily.    [provider]  cholecalciferol (VITAMIN D) 1000 units tablet Take 1,000 Units by mouth daily.    [provider]  Cranberry 400 MG CAPS Take 400 mg by mouth daily.    [provider]  DULoxetine (CYMBALTA) 60 MG capsule Take 60 mg by mouth daily. 01/09/23 01/04/24  [provider]  gabapentin (NEURONTIN) 100 MG capsule Take 200 mg by mouth every morning. 06/17/21   [provider]  gabapentin (NEURONTIN) 800 MG tablet Take 800 mg by mouth at bedtime. 07/08/21   [provider]  OZEMPIC, 1 MG/DOSE, 4 MG/3ML SOPN Inject 1 mg into the skin once a week. On Monday 09/02/21   [provider]  pantoprazole (PROTONIX) 40 MG tablet Take 1 tablet (40 mg total) by mouth 2 (two) times daily. 01/14/23   Sunnie Nielsen, DO  potassium chloride (KLOR-CON) 20 MEQ packet Take 40 mEq by mouth 2 (two) times daily. 01/09/23   [provider]  senna (SENOKOT) 8.6 MG tablet Take 2 tablets by mouth 2 (two) times daily. 06/24/22   [provider]  STIMULANT LAXATIVE 8.6-50 MG tablet Take 1 tablet by mouth at bedtime. 11/07/21   [provider]  Physical Exam: Vitals:   03/15/23 1057 03/15/23 1058 03/15/23 1300  BP: 113/69  (!) 135/50  Pulse: (!) 106  97  Resp: 18    Temp: 98.2 F (36.8 C)    SpO2: 98%  99%  Weight:  85.5 kg     Constitutional: No acute distress Head: Atraumatic Eyes: Conjunctiva clear ENM: Moist mucous membranes. Normal dentition.  Neck: Supple Respiratory: Clear to auscultation bilaterally, no wheezing/rales/rhonchi. Normal respiratory effort. No accessory muscle use. . Cardiovascular: Regular rate and rhythm. No murmurs/rubs/gallops. Abdomen: Non-tender, obese No masses. No rebound or guarding.  Positive bowel sounds. Ileal conduit ostomy in place Musculoskeletal: decreased muscle tone Skin:  Extremities: warm, mild edema of LEs Neurologic: Alert, decreased strength in extremities, unable to move legs Psychiatric: Normal insight and judgement.   Labs on Admission: I have personally reviewed following labs and imaging studies  CBC: Recent Labs  Lab 03/15/23 1542  WBC 9.6  NEUTROABS 7.9*  HGB 13.6  HCT 40.9  MCV 89.5  PLT 344   Basic Metabolic Panel: Recent Labs  Lab 03/15/23 1201  NA 133*  K 4.4  CL 100  CO2 20*  GLUCOSE 90  BUN 11  CREATININE 0.61  CALCIUM 8.3*   GFR: Estimated Creatinine Clearance: 86 mL/min (by C-G formula based on SCr of 0.61 mg/dL). Liver Function Tests: Recent Labs  Lab 03/15/23 1201  AST 21  ALT 11  ALKPHOS 45  BILITOT 1.6*  PROT 7.6  ALBUMIN 3.6   No results for input(s): "LIPASE", "AMYLASE" in the last 168 hours. No results for input(s): "AMMONIA" in the last 168 hours. Coagulation Profile: Recent Labs  Lab 03/15/23 1500  INR 1.2   Cardiac Enzymes: No results for input(s): "CKTOTAL", "CKMB", "CKMBINDEX", "TROPONINI" in the last 168 hours. BNP (last 3 results) No results for input(s): "PROBNP" in the last 8760 hours. HbA1C: No results for input(s): "HGBA1C" in the last 72 hours. CBG: No results for input(s): "GLUCAP" in the last 168 hours. Lipid Profile: No results for input(s): "CHOL", "HDL", "LDLCALC", "TRIG", "CHOLHDL", "LDLDIRECT" in the last 72 hours. Thyroid Function Tests: No results for input(s): "TSH", "T4TOTAL", "FREET4", "T3FREE", "THYROIDAB" in the last 72 hours. Anemia Panel: No results for input(s): "VITAMINB12", "FOLATE", "FERRITIN", "TIBC", "IRON", "RETICCTPCT" in the last 72 hours. Urine analysis:    Component Value Date/Time   COLORURINE YELLOW (A) 03/15/2023 1201   APPEARANCEUR CLEAR (A) 03/15/2023 1201   APPEARANCEUR Cloudy 10/05/2014 2100   LABSPEC 1.012 03/15/2023 1201   LABSPEC 1.009  10/05/2014 2100   PHURINE 7.0 03/15/2023 1201   GLUCOSEU NEGATIVE 03/15/2023 1201   GLUCOSEU Negative 10/05/2014 2100   HGBUR SMALL (A) 03/15/2023 1201   BILIRUBINUR NEGATIVE 03/15/2023 1201   BILIRUBINUR Negative 10/05/2014 2100   KETONESUR 20 (A) 03/15/2023 1201   PROTEINUR NEGATIVE 03/15/2023 1201   NITRITE NEGATIVE 03/15/2023 1201   LEUKOCYTESUR MODERATE (A) 03/15/2023 1201   LEUKOCYTESUR 3+ 10/05/2014 2100    Radiological Exams on Admission: CT ABDOMEN PELVIS W CONTRAST  Result Date: 03/15/2023 CLINICAL DATA:  Buttock wound. Evaluate for abscess. Weakness and fever. Multiple sclerosis. EXAM: CT ABDOMEN AND PELVIS WITH CONTRAST TECHNIQUE: Multidetector CT imaging of the abdomen and pelvis was performed using the standard protocol following bolus administration of intravenous contrast. RADIATION DOSE REDUCTION: This exam was performed according to the departmental dose-optimization program which includes automated exposure control, adjustment of the mA and/or kV according to patient size and/or use of iterative reconstruction technique. CONTRAST:  OMNIPAQUE IOHEXOL 300 MG/ML  SOLN COMPARISON:  09/03/2021 FINDINGS: Lower chest: Similar left and increased right base volume loss airspace disease favoring atelectasis. Normal heart size with trace bilateral pleural fluid. Hepatobiliary: Normal liver. Multiple gallstones without acute cholecystitis or biliary duct dilatation. Pancreas: Normal, without mass or ductal dilatation. Spleen: Normal in size, without focal abnormality. Adrenals/Urinary Tract: Normal adrenal glands. Moderate right greater than left renal cortical scarring. Possible punctate renal collecting system calculi versus early contrast excretion. No hydronephrosis. Status post right lower quadrant ileal conduit, suboptimally evaluated. Stomach/Bowel: Normal stomach, without wall thickening. Massive colonic stool burden, including a 7.6 cm stool ball in the rectum. Otherwise normal  small bowel. Vascular/Lymphatic: Aortic atherosclerosis. No abdominopelvic adenopathy. Reproductive: Hysterectomy.  No adnexal mass. Other: No significant free fluid.  No free intraperitoneal air. Musculoskeletal: Subcutaneous edema superficial and medial to the left ischial tuberosity including on 17/5. No drainable abscess or osseous destruction. Lumbosacral spine fixation. IMPRESSION: 1. Subcutaneous edema superficial to the medial left ischial tuberosity, favoring cellulitis. No drainable abscess or evidence of osteomyelitis. 2.  Possible constipation.  Probable concurrent fecal impaction. 3. Scarred kidneys in the setting of prior right lower quadrant ileal conduit. Possible bilateral nephrolithiasis. 4. Trace bilateral pleural fluid. 5. Cholelithiasis 6.  Aortic Atherosclerosis (ICD10-I70.0). Electronically Signed   By: Jeronimo Greaves M.D.   On: 03/15/2023 15:14   DG Chest Port 1 View  Result Date: 03/15/2023 CLINICAL DATA:  Generalized body aches EXAM: PORTABLE CHEST 1 VIEW COMPARISON:  01/11/2023 FINDINGS: Left basilar airspace disease likely reflecting atelectasis. No focal consolidation. No pleural effusion or pneumothorax. Heart and mediastinal contours are unremarkable. No acute osseous abnormality. IMPRESSION: No active disease. Electronically Signed   By: Elige Ko M.D.   On: 03/15/2023 11:37    Assessment/Plan Principal Problem:   Cellulitis Active Problems:   Multiple sclerosis (HCC)   Chronic diastolic CHF (congestive heart failure) (HCC)   Obesity (BMI 30-39.9)   Neuropathic pain   Neurogenic bladder   Paraplegia (HCC)    # Sacral decubitus ulcer # Cellulitis Stage 3, CT does not demonstrate abscess or bone involvement. Induration noted. Has not been on antibiotics. No fever or white count or elevated lactate - will start doxy/keflex - will consult wound care - will also consult gen surg to ensure no need for surgical debridement - if no need for surgery anticipate  discharge tomorrow  # Constipation No bm in several days, lots of stool on CT. - enema ordered by EDP - continue home bowel regimen, add miralax  # Neurogenic bladder With ileal conduit. No fever or leukocytosis to suggest uti - ostomy consult  # MS - cont home baclofen, amantadine  # Chronic pain - home duloxetine, gabapentin    DVT prophylaxis: lovenox Code Status: full  Family Communication: sister updated @ bedside  Consults called: gen surg   Level of care: Med-Surg    Silvano Bilis MD [facility] Pager [phone]  If 7PM-7AM, please contact night-coverage [email or web address] Password [facility]  03/15/2023, 5:23 PM

## 2023-03-15 NOTE — ED Provider Notes (Signed)
[facility] Provider Note    Event Date/Time   First MD Initiated Contact with Patient 03/15/23 1052     (approximate)   History   Fatigue   HPI  [name] is a 52 y.o. female   Past medical history of, multiple sclerosis and bedbound, buttock wound who presents to the emergency department with increased pain to the buttock wound, drainage, fever at home of 100.6, chills, fatigue and generalized weakness.  Denies chest pain, shortness of breath, GI or GU complaints.  Independent Historian contributed to assessment above: Family members and caretakers are at bedside to corroborate information given above      Physical Exam   Triage Vital Signs: ED Triage Vitals  Enc Vitals Group     BP 03/15/23 1057 113/69     Pulse Rate 03/15/23 1057 (!) 106     Resp 03/15/23 1057 18     Temp 03/15/23 1057 98.2 F (36.8 C)     Temp src --      SpO2 03/15/23 1057 98 %     Weight 03/15/23 1058 188 lb 6.4 oz (85.5 kg)     Height --      Head Circumference --      Peak Flow --      Pain Score --      Pain Loc --      Pain Edu? --      Excl. in GC? --     Most recent vital signs: Vitals:   03/15/23 1057 03/15/23 1300  BP: 113/69 (!) 135/50  Pulse: (!) 106 97  Resp: 18   Temp: 98.2 F (36.8 C)   SpO2: 98% 99%    General: Awake, no distress.  CV:  Good peripheral perfusion.  Resp:  Normal effort.  Abd:  No distention.  Other:  She has an ulceration to her buttock with induration surrounding but no obvious fluctuance, no obvious drainage, no significant cellulitic changes surrounding.  It is painful to palpation.  Soft nontender abdomen with clear urine drainage from stoma bag on the abdomen, afebrile, clear lungs.   ED Results / Procedures / Treatments   Labs (all labs ordered are listed, but only abnormal results are displayed) Labs Reviewed  COMPREHENSIVE METABOLIC PANEL - Abnormal; Notable for the following components:      Result  Value   Sodium 133 (*)    CO2 20 (*)    Calcium 8.3 (*)    Total Bilirubin 1.6 (*)    All other components within normal limits  URINALYSIS, W/ REFLEX TO CULTURE (INFECTION SUSPECTED) - Abnormal; Notable for the following components:   Color, Urine YELLOW (*)    APPearance CLEAR (*)    Hgb urine dipstick SMALL (*)    Ketones, ur 20 (*)    Leukocytes,Ua MODERATE (*)    Bacteria, UA FEW (*)    All other components within normal limits  CULTURE, BLOOD (ROUTINE X 2)  CULTURE, BLOOD (ROUTINE X 2)  URINE CULTURE  LACTIC ACID, PLASMA  LACTIC ACID, PLASMA  CBC WITH DIFFERENTIAL/PLATELET  PROTIME-INR  APTT  CBC WITH DIFFERENTIAL/PLATELET     I ordered and reviewed the above labs they are notable for tenderness at baseline, electrolytes unremarkable and lactic normal.  Urinalysis shows bacteria and leukocytes.  EKG  ED ECG REPORT I, Pilar Jarvis, the attending physician, personally viewed and interpreted this ECG.   Date: 03/15/2023  EKG Time: 1058  Rate: 102  Rhythm: sinus tachycardia  Axis: nl  Intervals:none  ST&T Change: no stemi    RADIOLOGY I independently reviewed and interpreted chest x-ray and see no obvious focality or pneumothorax   PROCEDURES:  Critical Care performed: No  Procedures   MEDICATIONS ORDERED IN ED: Medications  acetaminophen (TYLENOL) tablet 1,000 mg (has no administration in time range)  lactated ringers bolus 1,000 mL (1,000 mLs Intravenous New Bag/Given 03/15/23 1240)  vancomycin (VANCOCIN) IVPB 1000 mg/200 mL premix (0 mg Intravenous Stopped 03/15/23 1512)  cefTRIAXone (ROCEPHIN) 1 g in sodium chloride 0.9 % 100 mL IVPB (0 g Intravenous Stopped 03/15/23 1513)  iohexol (OMNIPAQUE) 300 MG/ML solution 100 mL (100 mLs Intravenous Contrast Given 03/15/23 1404)    External physician / consultants:  I spoke with hospitalist for admission and regarding care plan for this patient.   IMPRESSION / MDM / ASSESSMENT AND PLAN / ED COURSE  I reviewed  the triage vital signs and the nursing notes.                                Patient's presentation is most consistent with acute presentation with potential threat to life or bodily function.  Differential diagnosis includes, but is not limited to, buttock ulcer, cellulitis, abscess, sepsis   The patient is on the cardiac monitor to evaluate for evidence of arrhythmia and/or significant heart rate changes.  MDM:   Cellulitis of the buttock with no drainable abscess noted on CT scan, imaging favors a cellulitis, and patient has increased pain to the area with reported drainage and fever at home.  Ordered IV vancomycin for cellulitis.  She has bacteria and leukocytes in the urine covered with Rocephin, initially tachycardic but blood pressure has been normotensive and she has been afebrile in the emergency department.  Her CBC was hemolyzed and is being resent, pending these results she will be admitted for cellulitis/UTI.         FINAL CLINICAL IMPRESSION(S) / ED DIAGNOSES   Final diagnoses:  Cellulitis of buttock  Bacteriuria     Rx / DC Orders   ED Discharge Orders     None        Note:  This document was prepared using Dragon voice recognition software and may include unintentional dictation errors.    Pilar Jarvis, MD 03/15/23 1520

## 2023-03-15 NOTE — ED Provider Notes (Signed)
Care of this patient assumed from prior physician at 1530 pending CT, reevaluation, and anticipated admission. Please see prior physician note for further details.  Briefly, this is a 52 year old female who presents with a wound of her buttock, fever at home, and weakness.  She was tachycardic initially, improved on repeat.  Noted to have an ulceration with associated induration.  CT was ordered to evaluate for possible abscess.This demonstrated subcutaneous edema concerning for cellulitis without drainable abscess.  Did also note possible constipation/fecal impaction.  Urine was concerning for infection though of note, patient does have an ileal conduit.  Patient was ordered for Rocephin and vancomycin by prior provider.  Patient reevaluated updated on the results of her workup.  I have ordered an enema for her constipation.  She is comfortable with plan for admission.  Will reach out to hospitalist team.  Case discussed with hospitalist team.  They will come evaluate the patient for anticipated admission.     Trinna Post, MD 03/16/23 [phone]

## 2023-03-15 NOTE — Progress Notes (Signed)
There is consult for 2nd PIV access with blood draw. There is order for main fluid with antibiotics. Patient doesn't need 2nd PIV access at this time. Informed patient's RN regarding this matter and should call phlebotomist for the blood draw. HS [employer]

## 2023-03-15 NOTE — ED Notes (Signed)
Lab tech came down and was able to draw a blue top tube but unsuccessful with the remaining labs needed.

## 2023-03-16 ENCOUNTER — Other Ambulatory Visit: Payer: Self-pay | Admitting: Obstetrics and Gynecology

## 2023-03-16 DIAGNOSIS — L89152 Pressure ulcer of sacral region, stage 2: Secondary | ICD-10-CM

## 2023-03-16 DIAGNOSIS — L89153 Pressure ulcer of sacral region, stage 3: Secondary | ICD-10-CM | POA: Diagnosis not present

## 2023-03-16 LAB — URINE CULTURE

## 2023-03-16 LAB — CBC
HCT: 38.2 % (ref 36.0–46.0)
Hemoglobin: 12.5 g/dL (ref 12.0–15.0)
MCH: 30 pg (ref 26.0–34.0)
MCHC: 32.7 g/dL (ref 30.0–36.0)
MCV: 91.8 fL (ref 80.0–100.0)
Platelets: 297 10*3/uL (ref 150–400)
RBC: 4.16 MIL/uL (ref 3.87–5.11)
RDW: 13.6 % (ref 11.5–15.5)
WBC: 6.5 10*3/uL (ref 4.0–10.5)
nRBC: 0 % (ref 0.0–0.2)

## 2023-03-16 LAB — BASIC METABOLIC PANEL
Anion gap: 8 (ref 5–15)
BUN: 10 mg/dL (ref 6–20)
CO2: 25 mmol/L (ref 22–32)
Calcium: 8.8 mg/dL — ABNORMAL LOW (ref 8.9–10.3)
Chloride: 106 mmol/L (ref 98–111)
Creatinine, Ser: 0.63 mg/dL (ref 0.44–1.00)
GFR, Estimated: >60 mL/min (ref >60)
Glucose, Bld: 98 mg/dL (ref 70–99)
Potassium: 3.3 mmol/L — ABNORMAL LOW (ref 3.5–5.1)
Sodium: 139 mmol/L (ref 135–145)

## 2023-03-16 LAB — CULTURE, BLOOD (ROUTINE X 2): Special Requests: ADEQUATE

## 2023-03-16 MED ORDER — CEPHALEXIN 500 MG PO CAPS: 500.0000 mg | ORAL_CAPSULE | Freq: Four times a day (QID) | ORAL | 0 refills | Status: AC

## 2023-03-16 MED ORDER — FOAM DRESSING CIRCULAR BORDER EX PADS: 1.0000 | MEDICATED_PAD | CUTANEOUS | 1 refills | Status: AC

## 2023-03-16 MED ORDER — POLYETHYLENE GLYCOL 3350 17 G PO PACK
34.0000 g | PACK | Freq: Once | ORAL | Status: AC
  Administered 2023-03-16: 34 g via ORAL
  Filled 2023-03-16: qty 2

## 2023-03-16 MED ORDER — "AQUACEL AG FOAM 6""X6"" EX PADS": 1.0000 | MEDICATED_PAD | Freq: Every day | CUTANEOUS | 1 refills | Status: AC

## 2023-03-16 MED ORDER — DOXYCYCLINE HYCLATE 100 MG PO TABS: 100.0000 mg | ORAL_TABLET | Freq: Two times a day (BID) | ORAL | 0 refills | Status: AC

## 2023-03-16 NOTE — Plan of Care (Signed)
Resolve/adequate for discharge.   V   

## 2023-03-16 NOTE — Care Management Obs Status (Signed)
MEDICARE OBSERVATION STATUS NOTIFICATION   Patient Details  Name: [name] MRN: [id number] Date of Birth: 1971-01-09   Medicare Observation Status Notification Given:  Yes    Kreg Shropshire, RN 03/16/2023, 10:03 AM

## 2023-03-16 NOTE — TOC Transition Note (Addendum)
Transition of Care [facility]) - CM/SW Discharge Note   Patient Details  Name: [name] MRN: [id number] Date of Birth: Jan 11, 1971  Transition of Care [facility]) CM/SW Contact:  Kreg Shropshire, RN Phone Number: 03/16/2023, 10:38 AM   Clinical Narrative:     Patient will DC to: Home with Home Health Anticipated DC date: 03/16/23 Family notified: Pt stated she will notify family Transport by: Non emergent EMS DME: New air mattress for wound alternating Home Health: Referral made to [name] of [facility] for RN wound care services Per MD patient ready for DC to . RN and patient. DC packet on chart. Ambulance transport requested for patient.  TOC signing off.   Final next level of care: Home w Home Health Services Barriers to Discharge: Transportation   Patient Goals and CMS Choice      Discharge Placement                  Patient to be transferred to facility by: non emergent EMS Name of family member notified: pt will update family Patient and family notified of of transfer: 03/16/23  Discharge Plan and Services Additional resources added to the After Visit Summary for                  DME Arranged: Specialty mattress DME Agency: [employer] Date DME Agency Contacted: 03/16/23 Time DME Agency Contacted: 1015 Representative spoke with at DME Agency: Yvone Neu HH Arranged: RN (wound care) HH Agency: [facility] Date HH Agency Contacted: 03/16/23 Time HH Agency Contacted: 1022 Representative spoke with at [facility] Agency: Feliberto Gottron  Social Determinants of Health (SDOH) Interventions SDOH Screenings   Tobacco Use: Low Risk  (03/15/2023)     Readmission Risk Interventions     No data to display

## 2023-03-16 NOTE — Discharge Summary (Signed)
[name]:[id number] DOB: [birth year] DOA: 03/15/2023  PCP: [name], MD  Admit date: 03/15/2023 Discharge date: 03/16/2023  Time spent: 35 minutes  Recommendations for Outpatient Follow-up:  Close f/u wound care center    Discharge Diagnoses:  Principal Problem:   Decubitus ulcer of sacral region, stage 3 (HCC) Active Problems:   Multiple sclerosis (HCC)   Chronic diastolic CHF (congestive heart failure) (HCC)   Obesity (BMI 30-39.9)   Neuropathic pain   Neurogenic bladder   Paraplegia (HCC)   Cellulitis   Discharge Condition: stable  Diet recommendation: heart healthy  Filed Weights   03/15/23 1058  Weight: 85.5 kg    History of present illness:  [name] is a 52 y.o. female with medical history significant for ms with paraplegia, hgn, neurogenic bladder w/ ileal conduit, presenting with the above.   Says has had ulcer on sacrum for some time, not sure, but worsening lately, more drainage. No pain, no fevers. Saw pcp who was concerned for infection so referred here. Not followed by wound center. Gets around with a power chair. Last BM last week, has chronic constipation  Hospital Course:  Bedbound 2/2 paraplegia from MS. Has worsening stage 3 sacral decubitus ulcer. Possibly infected. No abscess or osteo seen on ct. Admitted with concern for infection. Evaluated by wound care nurse and gen surg. No indication for debridement or other surgery. Not clear this is infected but will treat with course of abx. Wound care instructions provided, home health rn/aide ordered, and referred to our wound care center. Patient's sister works there and thinks she can arrange for the patient to be seen in an expedited manner.   Procedures: none  Consultations: Gen surg, wound care  Discharge Exam: Vitals:   03/16/23 0431 03/16/23 0845  BP: 109/64 (!) 119/57  Pulse: 93 82  Resp: 16 16  Temp: 98.1 F (36.7 C) 98.3 F (36.8 C)  SpO2: 98% 100%     Constitutional: No acute distress Head: Atraumatic Eyes: Conjunctiva clear ENM: Moist mucous membranes. Normal dentition.  Neck: Supple Respiratory: Clear to auscultation bilaterally, no wheezing/rales/rhonchi. Normal respiratory effort. No accessory muscle use. . Cardiovascular: Regular rate and rhythm. No murmurs/rubs/gallops. Abdomen: Non-tender, obese No masses. No rebound or guarding. Positive bowel sounds. Ileal conduit ostomy in place Musculoskeletal: decreased muscle tone Skin:  Extremities: warm, mild edema of LEs Neurologic: Alert, decreased strength in extremities, unable to move legs Psychiatric: Normal insight and judgement.  Discharge Instructions   Discharge Instructions     Ambulatory referral to Wound Clinic   Complete by: As directed    Diet - low sodium heart healthy   Complete by: As directed    Discharge wound care:   Complete by: As directed    Cut piece of Aquacel and tuck into left buttock wound Q day, then cover with foam dressing. Change foam dressing Q 3 days or PRN soiling.   Increase activity slowly   Complete by: As directed       Allergies as of 03/16/2023       Reactions   Latex Swelling, Other (See Comments)   Reaction:  Facial swelling         Medication List     STOP taking these medications    sulfamethoxazole-trimethoprim 800-160 MG tablet Commonly known as: BACTRIM DS       TAKE these medications    acetaminophen 500 MG tablet Commonly known as: TYLENOL Take 500 mg by mouth every 6 (six) hours  as needed.   amantadine 100 MG capsule Commonly known as: SYMMETREL Take 200 mg by mouth 2 (two) times daily.   Aquacel Ag Foam 6"X6" Pads Apply 1 each topically daily in the afternoon.   baclofen 20 MG tablet Commonly known as: LIORESAL Take 40 mg by mouth 2 (two) times daily.   cephALEXin 500 MG capsule Commonly known as: KEFLEX Take 1 capsule (500 mg total) by mouth every 6 (six) hours for 6 days.    cholecalciferol 1000 units tablet Commonly known as: VITAMIN D Take 1,000 Units by mouth daily.   Cranberry 400 MG Caps Take 400 mg by mouth daily.   doxycycline 100 MG tablet Commonly known as: VIBRA-TABS Take 1 tablet (100 mg total) by mouth every 12 (twelve) hours for 6 days.   DULoxetine 60 MG capsule Commonly known as: CYMBALTA Take 60 mg by mouth daily.   Foam Dressing Circular Border Pads Apply 1 each topically every 3 (three) days.   gabapentin 100 MG capsule Commonly known as: NEURONTIN Take 200 mg by mouth every morning.   gabapentin 800 MG tablet Commonly known as: NEURONTIN Take 800 mg by mouth at bedtime.   Ozempic (1 MG/DOSE) 4 MG/3ML Sopn Generic drug: Semaglutide (1 MG/DOSE) Inject 1 mg into the skin once a week. On Monday   pantoprazole 40 MG tablet Commonly known as: PROTONIX Take 1 tablet (40 mg total) by mouth 2 (two) times daily.   potassium chloride 20 MEQ packet Commonly known as: KLOR-CON Take 40 mEq by mouth 2 (two) times daily.   senna 8.6 MG tablet Commonly known as: SENOKOT Take 2 tablets by mouth 2 (two) times daily.   Stimulant Laxative 8.6-50 MG tablet Generic drug: senna-docusate Take 1 tablet by mouth at bedtime.               Discharge Care Instructions  (From admission, onward)           Start     Ordered   03/16/23 0000  Discharge wound care:       Comments: Cut piece of Aquacel and tuck into left buttock wound Q day, then cover with foam dressing. Change foam dressing Q 3 days or PRN soiling.   03/16/23 0953           Allergies  Allergen Reactions   Latex Swelling and Other (See Comments)    Reaction:  Facial swelling     Follow-up Information     [name], MD Follow up.   Specialty: Internal Medicine Contact information: [address] [place] [address] [phone]         [facility] Follow up.   Specialty: Wound Care Contact  information: [address] [phone] [place] [phone]                 The results of significant diagnostics from this hospitalization (including imaging, microbiology, ancillary and laboratory) are listed below for reference.    Significant Diagnostic Studies: CT ABDOMEN PELVIS W CONTRAST  Result Date: 03/15/2023 CLINICAL DATA:  Buttock wound. Evaluate for abscess. Weakness and fever. Multiple sclerosis. EXAM: CT ABDOMEN AND PELVIS WITH CONTRAST TECHNIQUE: Multidetector CT imaging of the abdomen and pelvis was performed using the standard protocol following bolus administration of intravenous contrast. RADIATION DOSE REDUCTION: This exam was performed according to the departmental dose-optimization program which includes automated exposure control, adjustment of the mA and/or kV according to patient size and/or use of iterative reconstruction  technique. CONTRAST:  OMNIPAQUE IOHEXOL 300 MG/ML  SOLN COMPARISON:  09/03/2021 FINDINGS: Lower chest: Similar left and increased right base volume loss airspace disease favoring atelectasis. Normal heart size with trace bilateral pleural fluid. Hepatobiliary: Normal liver. Multiple gallstones without acute cholecystitis or biliary duct dilatation. Pancreas: Normal, without mass or ductal dilatation. Spleen: Normal in size, without focal abnormality. Adrenals/Urinary Tract: Normal adrenal glands. Moderate right greater than left renal cortical scarring. Possible punctate renal collecting system calculi versus early contrast excretion. No hydronephrosis. Status post right lower quadrant ileal conduit, suboptimally evaluated. Stomach/Bowel: Normal stomach, without wall thickening. Massive colonic stool burden, including a 7.6 cm stool ball in the rectum. Otherwise normal small bowel. Vascular/Lymphatic: Aortic atherosclerosis. No abdominopelvic adenopathy. Reproductive: Hysterectomy.  No adnexal mass. Other: No significant free fluid.  No free  intraperitoneal air. Musculoskeletal: Subcutaneous edema superficial and medial to the left ischial tuberosity including on 17/5. No drainable abscess or osseous destruction. Lumbosacral spine fixation. IMPRESSION: 1. Subcutaneous edema superficial to the medial left ischial tuberosity, favoring cellulitis. No drainable abscess or evidence of osteomyelitis. 2.  Possible constipation.  Probable concurrent fecal impaction. 3. Scarred kidneys in the setting of prior right lower quadrant ileal conduit. Possible bilateral nephrolithiasis. 4. Trace bilateral pleural fluid. 5. Cholelithiasis 6.  Aortic Atherosclerosis (ICD10-I70.0). Electronically Signed   By: Jeronimo Greaves M.D.   On: 03/15/2023 15:14   DG Chest Port 1 View  Result Date: 03/15/2023 CLINICAL DATA:  Generalized body aches EXAM: PORTABLE CHEST 1 VIEW COMPARISON:  01/11/2023 FINDINGS: Left basilar airspace disease likely reflecting atelectasis. No focal consolidation. No pleural effusion or pneumothorax. Heart and mediastinal contours are unremarkable. No acute osseous abnormality. IMPRESSION: No active disease. Electronically Signed   By: Elige Ko M.D.   On: 03/15/2023 11:37    Microbiology: Recent Results (from the past 240 hour(s))  Urine Culture     Status: Abnormal   Collection Time: 03/15/23 12:01 PM   Specimen: Urine, Clean Catch  Result Value Ref Range Status   Specimen Description   Final    URINE, CLEAN CATCH Performed at [facility], [address] [place], [place] [address]    Special Requests   Final    NONE Reflexed from [id number] Performed at [facility], [address] [place], [place] [address]    Culture MULTIPLE SPECIES PRESENT, SUGGEST RECOLLECTION (A)  Final   Report Status 03/16/2023 FINAL  Final  Blood Culture (routine x 2)     Status: None (Preliminary result)   Collection Time: 03/15/23 12:02 PM   Specimen: BLOOD LEFT ARM  Result Value Ref Range Status   Specimen Description BLOOD LEFT ARM   Final   Special Requests   Final    BOTTLES DRAWN AEROBIC AND ANAEROBIC Blood Culture results may not be optimal due to an inadequate volume of blood received in culture bottles   Culture   Final    NO GROWTH < 24 HOURS Performed at [facility], [address], [place], [place] [address]    Report Status PENDING  Incomplete  Culture, blood (Routine X 2) w Reflex to ID Panel     Status: None (Preliminary result)   Collection Time: 03/15/23 11:19 PM   Specimen: BLOOD  Result Value Ref Range Status   Specimen Description BLOOD BLOOD LEFT HAND  Final   Special Requests   Final    BOTTLES DRAWN AEROBIC ONLY Blood Culture adequate volume   Culture   Final  NO GROWTH < 12 HOURS Performed at [facility], [address] [place] [place], [place] [address]    Report Status PENDING  Incomplete     Labs: Basic Metabolic Panel: Recent Labs  Lab 03/15/23 1201 03/16/23 0513  NA 133* 139  K 4.4 3.3*  CL 100 106  CO2 20* 25  GLUCOSE 90 98  BUN 11 10  CREATININE 0.61 0.63  CALCIUM 8.3* 8.8*   Liver Function Tests: Recent Labs  Lab 03/15/23 1201  AST 21  ALT 11  ALKPHOS 45  BILITOT 1.6*  PROT 7.6  ALBUMIN 3.6   No results for input(s): "LIPASE", "AMYLASE" in the last 168 hours. No results for input(s): "AMMONIA" in the last 168 hours. CBC: Recent Labs  Lab 03/15/23 1542 03/16/23 0513  WBC 9.6 6.5  NEUTROABS 7.9*  --   HGB 13.6 12.5  HCT 40.9 38.2  MCV 89.5 91.8  PLT 344 297   Cardiac Enzymes: No results for input(s): "CKTOTAL", "CKMB", "CKMBINDEX", "TROPONINI" in the last 168 hours. BNP: BNP (last 3 results) Recent Labs    01/11/23 1351  BNP 97.6    ProBNP (last 3 results) No results for input(s): "PROBNP" in the last 8760 hours.  CBG: No results for input(s): "GLUCAP" in the last 168 hours.     Signed:  Silvano Bilis MD.  [facility] 03/16/2023, 9:54 AM

## 2023-03-16 NOTE — Consult Note (Signed)
[facility] SURGICAL CONSULTATION NOTE (initial) - cpt: [id number]   HISTORY OF PRESENT ILLNESS (HPI):  52 y.o. female presented to [facility] ED yesterday for evaluation of fatigue and fever. Patient reports she has had a fever at home over the last 24 hours with increasing fatigue. She reports that her fever was "106" at home but EDP notes state 100.21F. She also has history of sacral ulceration. She believes this has been there for "2 weeks." No family to corroborate history. She does note that she has an aide that helps with local wound care. She does have a history of MS and paraplegia. Work up in the ED revealed a normal WBC at 9.6K, Hgb to 13.6, sCr - 0.61, hyponatremia to 133, normal lactic acid level at 0.8, BCX negative. She did have CT Abdomen/Pelvis concerning for possible cellulitis but no evidence of abscess nor osteomyelitis regarding the sacral wound. She was admitted to medicine and started on Keflex and Doxycycline. WOC RN also consulted for wound evaluation.   Surgery is consulted by hospitalist physician Dr. Shonna Chock, MD in this context for evaluation and management of sacral ulceration.  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 Authorizing Provider  sulfamethoxazole-trimethoprim (BACTRIM DS) 800-160 MG tablet Take 1 tablet by mouth every 12 (twelve) hours.  03/13/23 03/20/23 Yes [provider]  acetaminophen (TYLENOL) 500 MG tablet Take 500 mg by mouth every 6 (six) hours as needed.    [provider]  amantadine (SYMMETREL) 100 MG capsule Take 200 mg by mouth 2 (two) times daily.    [provider]  baclofen (LIORESAL) 20 MG tablet Take 40 mg by mouth 2 (two) times daily.    [provider]  cholecalciferol (VITAMIN D) 1000 units tablet Take 1,000 Units by mouth daily.    [provider]  Cranberry 400 MG CAPS Take 400 mg by mouth daily.    [provider]  DULoxetine (CYMBALTA) 60 MG capsule Take 60 mg by mouth daily. 01/09/23 01/04/24  [provider]  gabapentin (NEURONTIN) 100 MG capsule Take 200 mg by mouth every morning. 06/17/21   [provider]  gabapentin (NEURONTIN) 800 MG tablet Take 800 mg by mouth at bedtime. 07/08/21   [provider]  OZEMPIC, 1 MG/DOSE, 4 MG/3ML SOPN Inject 1 mg into the skin once a week. On Monday 09/02/21   [provider]  pantoprazole (PROTONIX) 40 MG tablet Take 1 tablet (40 mg total) by mouth 2 (two) times daily. 01/14/23   Sunnie Nielsen, DO  potassium chloride (KLOR-CON) 20 MEQ packet Take 40 mEq by mouth 2 (two) times daily. 01/09/23   [provider]  senna (SENOKOT) 8.6 MG tablet Take 2 tablets by mouth 2 (two) times daily. 06/24/22   [provider]  STIMULANT LAXATIVE 8.6-50 MG tablet Take 1 tablet by mouth at bedtime.  11/07/21   [provider]     ALLERGIES:  Allergies  Allergen Reactions   Latex Swelling and Other (See Comments)    Reaction:  Facial swelling      SOCIAL HISTORY:  Social History   Socioeconomic History   Marital status: Single    Spouse name: Not on file   Number of children: Not on file   Years of education: Not on file   Highest education level: Not on file  Occupational History   Not on file  Tobacco Use   Smoking status: Never   Smokeless tobacco: Never   Substance and Sexual Activity   Alcohol use: Yes    Alcohol/week: 0.0 standard drinks of alcohol    Comment: socially   Drug use: No   Sexual activity: Not Currently  Other Topics Concern   Not on file  Social History Narrative   Not on file   Social Determinants of Health   Financial Resource Strain: Not on file  Food Insecurity: Not on file  Transportation Needs: Not on file  Physical Activity: Not on file  Stress: Not on file  Social Connections: Not on file  Intimate Partner Violence: Not on file     FAMILY HISTORY:  Family History  Problem Relation Age of Onset   Osteoarthritis Mother    Hypertension Mother    Hyperlipidemia Mother    Thyroid disease Mother    Liver cancer Father    Asthma Brother       REVIEW OF SYSTEMS:  Review of Systems  Constitutional:  Positive for fever (Subjective). Negative for chills.  Gastrointestinal:  Negative for abdominal pain, nausea and vomiting.  Skin:  Negative for itching and rash.       + Sacral Wound   All other systems reviewed and are negative.   VITAL SIGNS:  Temp:  [98.1 F (36.7 C)-99.9 F (37.7 C)] 98.1 F (36.7 C) (06/20 0431) Pulse Rate:  [93-106] 93 (06/20 0431) Resp:  [16-18] 16 (06/20 0431) BP: (99-135)/(50-69) 109/64 (06/20 0431) SpO2:  [95 %-99 %] 98 % (06/20 0431) Weight:  [85.5 kg] 85.5 kg (06/19 1058)       Weight: 85.5 kg     INTAKE/OUTPUT:  06/19 0701 - 06/20 0700 In: 1300 [IV Piggyback:1300] Out: 2375 [Urine:2375]  PHYSICAL EXAM:  Physical Exam Vitals and nursing note reviewed. Exam conducted with a chaperone present.  Constitutional:      General: She is not in acute distress.    Appearance: Normal appearance. She is obese. She is not ill-appearing.     Comments: Resting in bed; NAD  HENT:     Head: Normocephalic and atraumatic.  Eyes:     General: No scleral icterus.    Conjunctiva/sclera: Conjunctivae normal.  Cardiovascular:     Rate and Rhythm: Normal rate.     Pulses: Normal  pulses.     Heart sounds: No murmur heard. Pulmonary:     Effort: Pulmonary effort is normal. No respiratory distress.  Abdominal:     General: There is no distension.     Palpations: Abdomen is soft.     Tenderness: There is no abdominal tenderness.     Comments: Ileal conduit in the RLQ; pink; urine in bag   Genitourinary:    Comments: Deferred Musculoskeletal:     Right lower leg: No edema.     Left lower leg: No edema.  Skin:    General: Skin is warm and dry.     Findings:  Wound present.     Comments: Stage III sacral ulceration; mild induration, no significant erythema, no evidence of abscess nor necrosis  Neurological:     Mental Status: She is alert and oriented to person, place, and time. Mental status is at baseline.  Psychiatric:        Mood and Affect: Mood normal.        Behavior: Behavior normal.     Sacral Wound (03/16/2023):     Labs:     Latest Ref Rng & Units 03/16/2023    5:13 AM 03/15/2023    3:42 PM 01/12/2023    7:00 AM  CBC  WBC 4.0 - 10.5 K/uL 6.5  9.6  12.1   Hemoglobin 12.0 - 15.0 g/dL 16.6  06.3  01.6   Hematocrit 36.0 - 46.0 % 38.2  40.9  35.5   Platelets 150 - 400 K/uL 297  344  244       Latest Ref Rng & Units 03/16/2023    5:13 AM 03/15/2023   12:01 PM 01/13/2023    5:13 AM  CMP  Glucose 70 - 99 mg/dL 98  90  83   BUN 6 - 20 mg/dL 10  11  10    Creatinine 0.44 - 1.00 mg/dL 0.10  9.32  3.55   Sodium 135 - 145 mmol/L 139  133  138   Potassium 3.5 - 5.1 mmol/L 3.3  4.4  3.9   Chloride 98 - 111 mmol/L 106  100  108   CO2 22 - 32 mmol/L 25  20  22    Calcium 8.9 - 10.3 mg/dL 8.8  8.3  8.8   Total Protein 6.5 - 8.1 g/dL  7.6    Total Bilirubin 0.3 - 1.2 mg/dL  1.6    Alkaline Phos 38 - 126 U/L  45    AST 15 - 41 U/L  21    ALT 0 - 44 U/L  11       Imaging studies:   CT Abdomen/Pelvis (03/15/2023) personally reviewed without evidence of underlying abscess nor necrosis to sacral wound, no osteomyelitis, and radiologist report reviewed  below: IMPRESSION: 1. Subcutaneous edema superficial to the medial left ischial tuberosity, favoring cellulitis. No drainable abscess or evidence of osteomyelitis. 2.  Possible constipation.  Probable concurrent fecal impaction. 3. Scarred kidneys in the setting of prior right lower quadrant ileal conduit. Possible bilateral nephrolithiasis. 4. Trace bilateral pleural fluid. 5. Cholelithiasis 6.  Aortic Atherosclerosis (ICD10-I70.0).   Assessment/Plan: (ICD-10's: L57.152) 52 y.o. female with non-infected stage III sacral ulceration, complicated by pertinent comorbidities including paraplegia, MS.   - Fortunately, no overt evidence of infection to sacral wound and no evidence of underlying abscess, necrosis, nor osteomyelitis. There is in no indication for debridement from surgical perspective.   - Appreciate WOC RN evaluation; wound care orders  - Needs to continue local wound care, pressure offloading, frequent repositioning   - Reasonable for PO Abx for possible cellulitis - Further management per primary service     - Nothing to do from surgical perspective, we will remain available should needs change  All of the above findings and recommendations were discussed with the patient, and all of patient's questions were answered to her expressed satisfaction.  Thank you for the opportunity to participate in this patient's care.   -- Lynden Oxford, PA-C [facility] 03/16/2023, 7:41 AM M-F: 7am - 4pm

## 2023-03-16 NOTE — Progress Notes (Signed)
    Durable Medical Equipment  (From admission, onward)           Start     Ordered   03/16/23 1014  For home use only DME Other see comment  Once       Comments: Alternating pressure pad and pump  Question:  Length of Need  Answer:  Lifetime   03/16/23 1014           Inability to move in order to change body position.  Stage 3 pressure injury to sacrum and altered sensory perception

## 2023-03-16 NOTE — Care Management CC44 (Signed)
Condition Code 44 Documentation Completed  Patient Details  Name: [name] MRN: [id number] Date of Birth: [birth year]   Condition Code 44 given:  Yes Patient signature on Condition Code 44 notice:  Yes Documentation of 2 MD's agreement:  Yes Code 44 added to claim:  Yes    Kreg Shropshire, RN 03/16/2023, 10:03 AM

## 2023-03-16 NOTE — Consult Note (Addendum)
WOC Nurse Consult Note: Reason for Consult: Consult requested for sacrum wound.  Assessed the location appearance with the surgical PA at the bedside. Pt states she has home health assistance with dressing changes.  Pt has pink dry scar tissue surrounding the Stage 3 pressure injury; 1X5X1cm, 90% red, 10% yellow at the edge. Mod amt tan drainage, no odor, drainage, or fluctuance. Pressure Injury POA: Yes Dressing procedure/placement/frequency: Topical treatment orders provided for bedside nurses to perform as follows to absorb drainage and provide antimicrobial benefits: Cut piece of Aquacel and tuck into left buttock wound Q day, then cover with foam dressing. Change foam dressing Q 3 days or PRN soiling.  WOC Nurse ostomy consult note Pt has a Urostomy which has been present since 2013, surgery was performed at [facility]. Stoma is red and viable, above skin level, 1 1/2 inches, mod amt urine (100cc) emptied, intact skin surrounding the stoma.  Student nurse assisted with the procedure and applied a barrier ring and 2 piece pouching system and attached to bedside drainage bag.  Pt states she has home health assistance for pouch changes. 2 sets of each supply left at the bedside for staff nurse's use. Please re-consult if further assistance is needed.  Thank-you,  Cammie Mcgee MSN, RN, CWOCN, [place], CNS [phone]

## 2023-03-17 LAB — CULTURE, BLOOD (ROUTINE X 2): Culture: NO GROWTH

## 2023-03-18 LAB — CULTURE, BLOOD (ROUTINE X 2)

## 2023-03-20 LAB — CULTURE, BLOOD (ROUTINE X 2)

## 2023-03-21 ENCOUNTER — Encounter: Payer: 59 | Attending: Physician Assistant | Admitting: Physician Assistant

## 2023-03-21 DIAGNOSIS — G35 Multiple sclerosis: Secondary | ICD-10-CM | POA: Diagnosis not present

## 2023-03-21 DIAGNOSIS — L89323 Pressure ulcer of left buttock, stage 3: Secondary | ICD-10-CM | POA: Insufficient documentation

## 2023-03-21 NOTE — Progress Notes (Signed)
SU, Samantha ([id number]) [id number] Page 1 of 9 Visit Report for 03/21/2023 Allergy List Details Patient Name: Date of Service: Samantha, Pratt 03/21/2023 8:00 A M Medical Record Number: [id number] Patient Account Number: [id number] Date of Birth/Sex: Treating RN: 12/09/1970 (52 y.o. Female) Huel Coventry Primary Care : [name], [name] Other Clinician: Referring : Treating /Extender: Barth Kirks Weeks in Treatment: 0 Allergies Active Allergies latex Reaction: swelling Allergy Notes Electronic Signature(s) Signed: 03/21/2023 10:29:59 AM By: Elliot Gurney, BSN, RN, CWS, Kim RN, BSN Entered By: Elliot Gurney, BSN, RN, CWS, Kim on 03/21/2023 08:18:59 -------------------------------------------------------------------------------- Arrival Information Details Patient Name: Date of Service: Samantha O Pratt, [name]. 03/21/2023 8:00 A M Medical Record Number: [id number] Patient Account Number: [id number] Date of Birth/Sex: Treating RN: 07-26-1971 (52 y.o. Female) Huel Coventry Primary Care : [name], [name] Other Clinician: Referring : Treating /Extender: Lucilla Lame in Treatment: 0 Visit Information Patient Arrived: Wheel Chair Arrival Time: 07:59 Accompanied By: caregiver in lobby Transfer Assistance: Nurse, adult Patient Identification Verified: Yes Secondary Verification Process Completed: Yes Patient Requires Transmission-Based Precautions: No Patient Has Alerts: Yes Patient Alerts: NOT DIABETIC Electronic Signature(s) Signed: 03/21/2023 10:29:59 AM By: Elliot Gurney, BSN, RN, CWS, Kim RN, BSN [place], [place] Pratt ([id number]) [phone].pdf Page 2 of 9 Entered By: Elliot Gurney, BSN, RN, CWS, Kim on 03/21/2023 08:16:10 -------------------------------------------------------------------------------- Clinic Level of Care Assessment Details Patient Name: Date of Service: [name], [name]  03/21/2023 8:00 A M Medical Record Number: [id number] Patient Account Number: [id number] Date of Birth/Sex: Treating RN: [birth year] (52 y.o. Female) Huel Coventry Primary Care : [name], [name] Other Clinician: Referring : Treating /Extender: Barth Kirks Weeks in Treatment: 0 Clinic Level of Care Assessment Items TOOL 2 Quantity Score []  - 0 Use when only an EandM is performed on the INITIAL visit ASSESSMENTS - Nursing Assessment / Reassessment X- 1 20 General Physical Exam (combine w/ comprehensive assessment (listed just below) when performed on new pt. evals) X- 1 25 Comprehensive Assessment (HX, ROS, Risk Assessments, Wounds Hx, etc.) ASSESSMENTS - Wound and Skin A ssessment / Reassessment X - Simple Wound Assessment / Reassessment - one wound 1 5 []  - 0 Complex Wound Assessment / Reassessment - multiple wounds []  - 0 Dermatologic / Skin Assessment (not related to wound area) ASSESSMENTS - Ostomy and/or Continence Assessment and Care []  - 0 Incontinence Assessment and Management []  - 0 Ostomy Care Assessment and Management (repouching, etc.) PROCESS - Coordination of Care X - Simple Patient / Family Education for ongoing care 1 15 []  - 0 Complex (extensive) Patient / Family Education for ongoing care X- 1 10 Staff obtains Chiropractor, Records, T Results / Process Orders est X- 1 10 Staff telephones HHA, Nursing Homes / Clarify orders / etc []  - 0 Routine Transfer to another Facility (non-emergent condition) []  - 0 Routine Hospital Admission (non-emergent condition) X- 1 15 New Admissions / Manufacturing engineer / Ordering NPWT Apligraf, etc. , []  - 0 Emergency Hospital Admission (emergent condition) X- 1 10 Simple Discharge Coordination []  - 0 Complex (extensive) Discharge Coordination PROCESS - Special Needs []  - 0 Pediatric / Minor Patient Management []  - 0 Isolation Patient Management []  - 0 Hearing / Language / Visual  special needs []  - 0 Assessment of Community assistance (transportation, D/C planning, etc.) []  - 0 Additional assistance / Altered mentation []  - 0 Support Surface(s) Assessment (bed, cushion, seat, etc.) DAIANNA, VASQUES Pratt ([id number]) [id number] Page 3 of 9  INTERVENTIONS - Wound Cleansing / Measurement X- 1 5 Wound Imaging (photographs - any number of wounds) []  - 0 Wound Tracing (instead of photographs) X- 1 5 Simple Wound Measurement - one wound []  - 0 Complex Wound Measurement - multiple wounds X- 1 5 Simple Wound Cleansing - one wound []  - 0 Complex Wound Cleansing - multiple wounds INTERVENTIONS - Wound Dressings []  - 0 Small Wound Dressing one or multiple wounds X- 1 15 Medium Wound Dressing one or multiple wounds []  - 0 Large Wound Dressing one or multiple wounds []  - 0 Application of Medications - injection INTERVENTIONS - Miscellaneous []  - 0 External ear exam []  - 0 Specimen Collection (cultures, biopsies, blood, body fluids, etc.) []  - 0 Specimen(s) / Culture(s) sent or taken to Lab for analysis X- 1 10 Patient Transfer (multiple staff / Nurse, adult / Similar devices) []  - 0 Simple Staple / Suture removal (25 or less) []  - 0 Complex Staple / Suture removal (26 or more) []  - 0 Hypo / Hyperglycemic Management (close monitor of Blood Glucose) []  - 0 Ankle / Brachial Index (ABI) - do not check if billed separately Has the patient been seen at the hospital within the last three years: Yes Total Score: 150 Level Of Care: New/Established - Level 4 Electronic Signature(s) Signed: 03/21/2023 10:29:59 AM By: Elliot Gurney, BSN, RN, CWS, Kim RN, BSN Entered By: Elliot Gurney, BSN, RN, CWS, Kim on 03/21/2023 09:56:07 -------------------------------------------------------------------------------- Encounter Discharge Information Details Patient Name: Date of Service: Samantha O Pratt, [name]. 03/21/2023 8:00 A M Medical Record Number: [id number] Patient  Account Number: [id number] Date of Birth/Sex: Treating RN: March 25, 1971 (52 y.o. Female) Huel Coventry Primary Care : [name], [name] Other Clinician: Referring : Treating /Extender: Barth Kirks Weeks in Treatment: 0 Encounter Discharge Information Items Post Procedure Vitals Discharge Condition: Stable Temperature (F): 97.7 Ambulatory Status: Wheelchair Pulse (bpm): 92 Discharge Destination: Home Respiratory Rate (breaths/min): 16 Transportation: Private Auto Blood Pressure (mmHg): 143/87 Accompanied By: caregiver Schedule Follow-up Appointment: Yes Clinical Summary of Care: Samantha, Pratt ([id number]) [phone].pdf Page 4 of 9 Electronic Signature(s) Signed: 03/21/2023 10:02:12 AM By: Elliot Gurney, BSN, RN, CWS, Kim RN, BSN Entered By: Elliot Gurney, BSN, RN, CWS, Kim on 03/21/2023 10:02:11 -------------------------------------------------------------------------------- Lower Extremity Assessment Details Patient Name: Date of Service: Samantha O Pratt, [name]. 03/21/2023 8:00 A M Medical Record Number: [id number] Patient Account Number: [id number] Date of Birth/Sex: Treating RN: 1970/11/22 (52 y.o. Female) Huel Coventry Primary Care : [name], [name] Other Clinician: Referring : Treating /Extender: Barth Kirks Weeks in Treatment: 0 Electronic Signature(s) Signed: 03/21/2023 10:29:59 AM By: Elliot Gurney, BSN, RN, CWS, Kim RN, BSN Entered By: Elliot Gurney, BSN, RN, CWS, Kim on 03/21/2023 08:41:29 -------------------------------------------------------------------------------- Multi-Disciplinary Care Plan Details Patient Name: Date of Service: Samantha O Pratt, [name]. 03/21/2023 8:00 A M Medical Record Number: [id number] Patient Account Number: [id number] Date of Birth/Sex: Treating RN: 1971-02-05 (52 y.o. Female) Huel Coventry Primary Care : [name], [name] Other Clinician: Referring : Treating  /Extender: Barth Kirks Weeks in Treatment: 0 Active Inactive Necrotic Tissue Nursing Diagnoses: Impaired tissue integrity related to necrotic/devitalized tissue Knowledge deficit related to management of necrotic/devitalized tissue Goals: Necrotic/devitalized tissue will be minimized in the wound bed Date Initiated: 03/21/2023 Target Resolution Date: 03/21/2023 Goal Status: Active Patient/caregiver will verbalize understanding of reason and process for debridement of necrotic tissue Date Initiated: 03/21/2023 Target Resolution Date: 03/20/2023 Goal Status: Active Interventions: Assess patient pain level pre-, during and post procedure  and prior to discharge Provide education on necrotic tissue and debridement process Samantha, MCDUFFIE ([id number]) [id number] Page 5 of 9 Treatment Activities: Apply topical anesthetic as ordered : 03/21/2023 Notes: Orientation to the Wound Care Program Nursing Diagnoses: Knowledge deficit related to the wound healing center program Goals: Patient/caregiver will verbalize understanding of the Wound Healing Center Program Date Initiated: 03/21/2023 Target Resolution Date: 03/21/2023 Goal Status: Active Interventions: Provide education on orientation to the wound center Notes: Peripheral Neuropathy Nursing Diagnoses: Knowledge deficit related to disease process and management of peripheral neurovascular dysfunction Potential alteration in peripheral tissue perfusion (select prior to confirmation of diagnosis) Goals: Patient/caregiver will verbalize understanding of disease process and disease management Date Initiated: 03/21/2023 Target Resolution Date: 03/21/2023 Goal Status: Active Interventions: Assess signs and symptoms of neuropathy upon admission and as needed Provide education on Management of Neuropathy and Related Ulcers Provide education on Management of Neuropathy upon discharge from the Wound  Center Notes: Pressure Nursing Diagnoses: Knowledge deficit related to causes and risk factors for pressure ulcer development Knowledge deficit related to management of pressures ulcers Potential for impaired tissue integrity related to pressure, friction, moisture, and shear Goals: Patient will remain free from development of additional pressure ulcers Date Initiated: 03/21/2023 Target Resolution Date: 03/21/2023 Goal Status: Active Patient/caregiver will verbalize understanding of pressure ulcer management Date Initiated: 03/21/2023 Target Resolution Date: 03/21/2023 Goal Status: Active Interventions: Assess: immobility, friction, shearing, incontinence upon admission and as needed Assess offloading mechanisms upon admission and as needed Assess potential for pressure ulcer upon admission and as needed Provide education on pressure ulcers Treatment Activities: Patient referred for home evaluation of offloading devices/mattresses : 03/21/2023 Notes: Soft Tissue Infection Nursing Diagnoses: Impaired tissue integrity Knowledge deficit related to disease process and management Knowledge deficit related to home infection control: handwashing, handling of soiled dressings, supply storage Potential for infection: soft tissue Goals: Patient will remain free of wound infection Date Initiated: 03/21/2023 Target Resolution Date: 03/21/2023 Goal Status: Active Patient/caregiver will verbalize understanding of or measures to prevent infection and contamination in the home setting Samantha, Pratt ([id number]) [id number] Page 6 of 9 Date Initiated: 03/21/2023 Target Resolution Date: 03/21/2023 Goal Status: Active Patient's soft tissue infection will resolve Date Initiated: 03/21/2023 Target Resolution Date: 03/27/2023 Goal Status: Active Signs and symptoms of infection will be recognized early to allow for prompt treatment Date Initiated: 03/21/2023 Target Resolution  Date: 03/21/2023 Goal Status: Active Interventions: Assess signs and symptoms of infection every visit Provide education on infection Treatment Activities: Systemic antibiotics : 03/21/2023 Notes: Wound/Skin Impairment Nursing Diagnoses: Impaired tissue integrity Knowledge deficit related to smoking impact on wound healing Knowledge deficit related to ulceration/compromised skin integrity Goals: Patient/caregiver will verbalize understanding of skin care regimen Date Initiated: 03/21/2023 Target Resolution Date: 03/21/2023 Goal Status: Active Ulcer/skin breakdown will have a volume reduction of 30% by week 4 Date Initiated: 03/21/2023 Target Resolution Date: 04/18/2023 Goal Status: Active Ulcer/skin breakdown will have a volume reduction of 50% by week 8 Date Initiated: 03/21/2023 Target Resolution Date: 05/16/2023 Goal Status: Active Ulcer/skin breakdown will have a volume reduction of 80% by week 12 Date Initiated: 03/21/2023 Target Resolution Date: 06/13/2023 Goal Status: Active Ulcer/skin breakdown will heal within 14 weeks Date Initiated: 03/21/2023 Target Resolution Date: 06/27/2023 Goal Status: Active Interventions: Assess patient/caregiver ability to obtain necessary supplies Assess patient/caregiver ability to perform ulcer/skin care regimen upon admission and as needed Assess ulceration(s) every visit Treatment Activities: Patient referred to home care : 03/21/2023 Skin care regimen initiated : 03/21/2023 Notes: Electronic  Signature(s) Signed: 03/21/2023 9:59:56 AM By: Elliot Gurney, BSN, RN, CWS, Kim RN, BSN Entered By: Elliot Gurney, BSN, RN, CWS, Kim on 03/21/2023 09:59:55 -------------------------------------------------------------------------------- Pain Assessment Details Patient Name: Date of Service: Samantha O Pratt, [name]. 03/21/2023 8:00 A M Medical Record Number: [id number] Patient Account Number: [id number] Date of Birth/Sex: Treating RN: 18-Mar-1971 (52 y.o. Female) Huel Coventry [place] ([id number]) [id number] Page 7 of 9 Primary Care : [name], [name] Other Clinician: Referring : Treating /Extender: Barth Kirks Weeks in Treatment: 0 Active Problems Location of Pain Severity and Description of Pain Patient Has Paino Yes Site Locations Pain Location: Generalized Pain With Dressing Change: Yes Rate the pain. Current Pain Level: 6 Pain Management and Medication Current Pain Management: Notes Patient is in constant pain from MS. Electronic Signature(s) Signed: 03/21/2023 10:29:59 AM By: Elliot Gurney, BSN, RN, CWS, Kim RN, BSN Entered By: Elliot Gurney, BSN, RN, CWS, Kim on 03/21/2023 08:17:26 -------------------------------------------------------------------------------- Patient/Caregiver Education Details Patient Name: Date of Service: Samantha O Pratt, [name] 6/25/2024andnbsp8:00 A M Medical Record Number: [id number] Patient Account Number: [id number] Date of Birth/Gender: Treating RN: [birth year] (52 y.o. Female) Huel Coventry Primary Care Physician: [name], [name] Other Clinician: Referring Physician: Treating Physician/Extender: Lucilla Lame in Treatment: 0 Education Assessment Education Provided To: Patient Education Topics Provided Pressure: Handouts: Pressure Injury: Prevention and Offloading Methods: Explain/Verbal Responses: State content correctly Wound/Skin ImpairmentANGELYN, Pratt ([id number]) [id number] Page 8 of 9 Handouts: Caring for Your Ulcer, Other: continue wound care as prescribed Methods: Explain/Verbal Responses: State content correctly Electronic Signature(s) Signed: 03/21/2023 10:29:59 AM By: Elliot Gurney, BSN, RN, CWS, Kim RN, BSN Entered By: Elliot Gurney, BSN, RN, CWS, Kim on 03/21/2023 10:00:44 -------------------------------------------------------------------------------- Wound Assessment Details Patient Name: Date of  Service: Samantha O Pratt, [name]. 03/21/2023 8:00 A M Medical Record Number: [id number] Patient Account Number: [id number] Date of Birth/Sex: Treating RN: 1971-07-30 (52 y.o. Female) Huel Coventry Primary Care : [name], [name] Other Clinician: Referring : Treating /Extender: Barth Kirks Weeks in Treatment: 0 Wound Status Wound Number: 1 Primary Etiology: Pressure Ulcer Wound Location: Left Gluteus Wound Status: Open Wounding Event: Blister Comorbid History: Neuropathy Date Acquired: 03/07/2023 Weeks Of Treatment: 0 Clustered Wound: No Photos Wound Measurements Length: (cm) 1.8 Width: (cm) 1 Depth: (cm) 2.1 Area: (cm) 1.414 Volume: (cm) 2.969 % Reduction in Area: % Reduction in Volume: Epithelialization: None Tunneling: Yes Position (o'clock): 12 Maximum Distance: (cm) 3.2 Wound Description Classification: Category/Stage III Exudate Amount: Large Exudate Type: Serous Exudate Color: amber Foul Odor After Cleansing: No Slough/Fibrino Yes Wound Bed Granulation Amount: Medium (34-66%) Exposed Structure Granulation Quality: Red Fascia Exposed: No Necrotic Amount: Medium (34-66%) Fat Layer (Subcutaneous Tissue) Exposed: Yes TILIA, FASO Pratt ([id number]) [id number] Page 9 of 9 Necrotic Quality: Adherent Slough Tendon Exposed: No Muscle Exposed: No Joint Exposed: No Bone Exposed: No Treatment Notes Wound #1 (Gluteus) Wound Laterality: Left Cleanser Soap and Water Discharge Instruction: Gently cleanse wound with antibacterial soap, rinse and pat dry prior to dressing wounds Peri-Wound Care Topical Primary Dressing Hydrofera Blue Ready Transfer Foam, 2.5x2.5 (in/in) Discharge Instruction: Apply Hydrofera Blue Ready to wound bed as directed Secondary Dressing (BORDER) Zetuvit Plus SILICONE BORDER Dressing 4x4 (in/in) Discharge Instruction: Please do not put silicone bordered dressings under wraps. Use  non-bordered dressing only. Secured With Compression Wrap Compression Stockings Facilities manager) Signed: 03/21/2023 5:18:50 PM By: Elliot Gurney, BSN, RN, CWS, Kim RN, BSN Signed: 03/22/2023 5:16:31 PM By: [name] Previous Signature: 03/21/2023 10:29:59 AM  Version By: Elliot Gurney, BSN, RN, CWS, Kim RN, BSN Entered By: [name] on 03/21/2023 14:30:28 -------------------------------------------------------------------------------- Vitals Details Patient Name: Date of Service: Samantha O Pratt, Marian Pratt. 03/21/2023 8:00 A M Medical Record Number: [id number] Patient Account Number: [id number] Date of Birth/Sex: Treating RN: 1970/12/10 (52 y.o. Female) Huel Coventry Primary Care : [name], [name] Other Clinician: Referring : Treating /Extender: Barth Kirks Weeks in Treatment: 0 Vital Signs Time Taken: 08:15 Temperature (F): 97.7 Height (in): 64 Pulse (bpm): 92 Weight (lbs): 166 Respiratory Rate (breaths/min): 16 Body Mass Index (BMI): 28.5 Blood Pressure (mmHg): 143/87 Reference Range: 80 - 120 mg / dl Electronic Signature(s) Signed: 03/21/2023 10:29:59 AM By: Elliot Gurney, BSN, RN, CWS, Kim RN, BSN Entered By: Elliot Gurney, BSN, RN, CWS, Kim on 03/21/2023 08:18:01

## 2023-03-21 NOTE — Progress Notes (Signed)
Samantha, Pratt ([id number]) [id number] Page 1 of 8 Visit Report for 03/21/2023 Chief Complaint Document Details Patient Name: Date of Service: [name], [name] 03/21/2023 8:00 A M Medical Record Number: [id number] Patient Account Number: [id number] Date of Birth/Sex: Treating RN: 08/05/1971 (52 y.o. Skip Mayer Primary Care Provider: [name], [name] Other Clinician: Referring Provider: Treating Provider/Extender: Barth Kirks Weeks in Treatment: 0 Information Obtained from: Patient Chief Complaint Pressure ulcer left gluteal region Electronic Signature(s) Signed: 03/21/2023 8:46:48 AM By: [name] PA-C Entered By: [name] on 03/21/2023 08:46:48 -------------------------------------------------------------------------------- Debridement Details Patient Name: Date of Service: [name], [name] 03/21/2023 8:00 A M Medical Record Number: [id number] Patient Account Number: [id number] Date of Birth/Sex: Treating RN: Feb 20, 1971 (52 y.o. Skip Mayer Primary Care Provider: [name], [name] Other Clinician: Referring Provider: Treating Provider/Extender: Barth Kirks Weeks in Treatment: 0 Debridement Performed for Assessment: Wound #1 Left Gluteus Performed By: Physician [name], PA-C Debridement Type: Chemical/Enzymatic/Mechanical Agent Used: saline and gauze Level of Consciousness (Pre-procedure): Awake and Alert Pre-procedure Verification/Time Out Yes - 08:45 Taken: Pain Control: Lidocaine Percent of Wound Bed Debrided: Instrument: Other : saline and gauze Bleeding: Minimum Hemostasis Achieved: Pressure Response to Treatment: Procedure was tolerated well Level of Consciousness (Post- Awake and Alert procedure): Post Debridement Measurements of Total Wound Length: (cm) 1.8 Stage: Category/Stage III Pratt, Samantha ([id number]) [id number] Page 2 of 8 Width: (cm) 1 Depth: (cm)  2.1 Volume: (cm) 2.969 Character of Wound/Ulcer Post Debridement: Stable Post Procedure Diagnosis Same as Pre-procedure Electronic Signature(s) Signed: 03/21/2023 9:54:42 AM By: Elliot Gurney, BSN, RN, CWS, Kim RN, BSN Signed: 03/21/2023 5:11:00 PM By: [name] PA-C Entered By: Elliot Gurney, BSN, RN, CWS, Kim on 03/21/2023 09:54:42 -------------------------------------------------------------------------------- HPI Details Patient Name: Date of Service: [name], [name] 03/21/2023 8:00 A M Medical Record Number: [id number] Patient Account Number: [id number] Date of Birth/Sex: Treating RN: [birth year] (52 y.o. Skip Mayer Primary Care Provider: [name], [name] Other Clinician: Referring Provider: Treating Provider/Extender: Barth Kirks Weeks in Treatment: 0 History of Present Illness HPI Description: 03-21-2023 upon evaluation today patient appears for initial evaluation regarding a wound that actually began around 03/07/2023. With that being said she has been having issues here with this area and then subsequently ended up in the hospital due to a high fever. She was discharged from the hospital and then started on oral antibiotics that was on 03-16-2023. The oral antibiotics are doxycycline and Keflex that she is currently taking. With that being said she was diagnosed with multiple sclerosis in 2000 [name] she has been in a well motorized wheelchair since 2014. She is a pleasant individual and unfortunately this is the issue that she is having currently with a pressure ulcer although this is the good news is it is the first time she has had this happen she has an excellent caregiver that takes great care of her and the family is very attentive. Electronic Signature(s) Signed: 03/21/2023 11:18:43 AM By: [name] PA-C Entered By: [name] on 03/21/2023 11:18:43 -------------------------------------------------------------------------------- Physical Exam Details Patient Name: Date  of Service: [name], [name]. 03/21/2023 8:00 A M Medical Record Number: [id number] Patient Account Number: [id number] Date of Birth/Sex: Treating RN: 11/30/1970 (52 y.o. Skip Mayer Primary Care Provider: [name], [name] Other Clinician: Referring Provider: Treating Provider/Extender: Barth Kirks Weeks in Treatment: 0 Constitutional RYNN, MARKIEWICZ ([id number]) [id number] Page 3 of 8 supine blood  pressure is within target range for patient.. pulse regular and within target range for patient.Marland Kitchen respirations regular, non-labored and within target range for patient.Marland Kitchen temperature within target range for patient.. Well-nourished and well-hydrated in no acute distress. Eyes conjunctiva clear no eyelid edema noted. pupils equal round and reactive to light and accommodation. Ears, Nose, Mouth, and Throat no gross abnormality of ear auricles or external auditory canals. normal hearing noted during conversation. mucus membranes moist. Respiratory normal breathing without difficulty. Psychiatric this patient is able to make decisions and demonstrates good insight into disease process. Alert and Oriented x 3. pleasant and cooperative. Notes Upon inspection patient's wound bed actually showed signs of having a fairly clean surface and did not appear to be any need for sharp debridement today which was great news and in general I was actually very pleased with where things stand at this point. Fortunately I do not see any signs of active infection locally or systemically which is great news. With that being said I think that she should definitely continue with her antibiotics that were prescribed at the ER I think this can be of utmost importance at this time. Currently the patient is not able to ambulate that is why she is in the wheelchair secondary to the multiple sclerosis. Electronic Signature(s) Signed: 03/21/2023 11:19:29 AM By: [name]  PA-C Entered By: [name] on 03/21/2023 11:19:29 -------------------------------------------------------------------------------- Physician Orders Details Patient Name: Date of Service: [name], Elia L. 03/21/2023 8:00 A M Medical Record Number: [id number] Patient Account Number: [id number] Date of Birth/Sex: Treating RN: 03/22/1971 (52 y.o. Skip Mayer Primary Care Provider: [name], [name] Other Clinician: Referring Provider: Treating Provider/Extender: Barth Kirks Weeks in Treatment: 0 Verbal / Phone Orders: No Diagnosis Coding ICD-10 Coding Code Description [phone] Pressure ulcer of left buttock, stage 3 G35 Multiple sclerosis Follow-up Appointments Return Appointment in 1 week. Home Health Home Health Company: - [facility] for wound care. May utilize formulary equivalent dressing for wound treatment orders unless otherwise specified. Home Health Nurse may visit PRN to address patients wound care needs. Scheduled days for dressing changes to be completed; exception, patient has scheduled wound care visit that day. - Monday, Wednesday, Friday **Please direct any NON-WOUND related issues/requests for orders to patient's Primary Care Physician. **If current dressing causes regression in wound condition, may D/C ordered dressing product/s and apply Normal Saline Moist Dressing daily until next Wound Healing Center or Other MD appointment. **Notify Wound Healing Center of regression in wound condition at [phone]. Bathing/ [employer] wounds with antibacterial soap and water. Anesthetic (Use 'Patient Medications' Section for Anesthetic Order Entry) Lidocaine applied to wound bed Off-Loading SOFI, BRYARS ([id number]) [phone].pdf Page 4 of 8 Roho cushion for wheelchair Hospital bed/mattress A fluidized (Group 3) - Home Health, please order for patient. ir Turn and reposition every 2 hours - Float  heels on pillow when in the bed. Additional Orders / Instructions Follow Nutritious Diet and Increase Protein Intake Wound Treatment Wound #1 - Gluteus Wound Laterality: Left Cleanser: Soap and Water (Home Health) 1 x Per Day/30 Days Discharge Instructions: Gently cleanse wound with antibacterial soap, rinse and pat dry prior to dressing wounds Prim Dressing: Hydrofera Blue Ready Transfer Foam, 2.5x2.5 (in/in) (Home Health) 1 x Per Day/30 Days ary Discharge Instructions: Apply Hydrofera Blue Ready to wound bed as directed (tuck small piece in Tunnel 12:00) Secondary Dressing: (BORDER) Zetuvit Plus SILICONE BORDER Dressing 4x4 (in/in) (Home Health) 1 x Per Day/30  Days Discharge Instructions: Please do not put silicone bordered dressings under wraps. Use non-bordered dressing only. Electronic Signature(s) Signed: 03/21/2023 10:29:59 AM By: Elliot Gurney, BSN, RN, CWS, Kim RN, BSN Signed: 03/21/2023 5:11:00 PM By: [name] PA-C Previous Signature: 03/21/2023 9:53:33 AM Version By: Elliot Gurney, BSN, RN, CWS, Kim RN, BSN Entered By: Elliot Gurney, BSN, RN, CWS, Kim on 03/21/2023 10:07:28 -------------------------------------------------------------------------------- Problem List Details Patient Name: Date of Service: [name], [name] 03/21/2023 8:00 A M Medical Record Number: [id number] Patient Account Number: [id number] Date of Birth/Sex: Treating RN: 1971-05-14 (52 y.o. Skip Mayer Primary Care Provider: [name], [name] Other Clinician: Referring Provider: Treating Provider/Extender: Barth Kirks Weeks in Treatment: 0 Active Problems ICD-10 Encounter Code Description Active Date MDM Diagnosis [phone] Pressure ulcer of left buttock, stage 3 03/21/2023 No Yes G35 Multiple sclerosis 03/21/2023 No Yes Inactive Problems Resolved Problems Electronic Signature(s) Signed: 03/21/2023 8:46:34 AM By: [name] PA-C Entered By: [name] on 03/21/2023 08:46:34 Papillion, Drucie Opitz ([id number])  [id number] Page 5 of 8 -------------------------------------------------------------------------------- Progress Note Details Patient Name: Date of Service: JEZELLE, [name] 03/21/2023 8:00 A M Medical Record Number: [id number] Patient Account Number: [id number] Date of Birth/Sex: Treating RN: 07-05-1971 (52 y.o. Skip Mayer Primary Care Provider: [name], [name] Other Clinician: Referring Provider: Treating Provider/Extender: Barth Kirks Weeks in Treatment: 0 Subjective Chief Complaint Information obtained from Patient Pressure ulcer left gluteal region History of Present Illness (HPI) 03-21-2023 upon evaluation today patient appears for initial evaluation regarding a wound that actually began around 03/07/2023. With that being said she has been having issues here with this area and then subsequently ended up in the hospital due to a high fever. She was discharged from the hospital and then started on oral antibiotics that was on 03-16-2023. The oral antibiotics are doxycycline and Keflex that she is currently taking. With that being said she was diagnosed with multiple sclerosis in 2000 [name] she has been in a well motorized wheelchair since 2014. She is a pleasant individual and unfortunately this is the issue that she is having currently with a pressure ulcer although this is the good news is it is the first time she has had this happen she has an excellent caregiver that takes great care of her and the family is very attentive. Patient History Information obtained from Patient, Chart. Allergies latex (Reaction: swelling) Social History Never smoker, Marital Status - Single, Alcohol Use - Never, Drug Use - No History, Caffeine Use - Never. Medical History Endocrine Denies history of Type II Diabetes Neurologic Patient has history of Neuropathy Medical A Surgical History Notes nd Neurologic MS 2014 Review of Systems  (ROS) Cardiovascular Complains or has symptoms of LE edema. Genitourinary Urostomy Integumentary (Skin) Complains or has symptoms of Wounds - buttock. Objective Constitutional supine blood pressure is within target range for patient.. pulse regular and within target range for patient.Marland Kitchen respirations regular, non-labored and within target range for patient.Marland Kitchen temperature within target range for patient.. Well-nourished and well-hydrated in no acute distress. Vitals Time Taken: 8:15 AM, Height: 64 in, Weight: 166 lbs, BMI: 28.5, Temperature: 97.7 F, Pulse: 92 bpm, Respiratory Rate: 16 breaths/min, Blood Pressure: 143/87 mmHg. Eyes conjunctiva clear no eyelid edema noted. pupils equal round and reactive to light and accommodation. LILYIAN, QUAYLE ([id number]) [id number] Page 6 of 8 Ears, Nose, Mouth, and Throat no gross abnormality of ear auricles or external auditory canals. normal hearing noted during conversation. mucus membranes moist. Respiratory normal  breathing without difficulty. Psychiatric this patient is able to make decisions and demonstrates good insight into disease process. Alert and Oriented x 3. pleasant and cooperative. General Notes: Upon inspection patient's wound bed actually showed signs of having a fairly clean surface and did not appear to be any need for sharp debridement today which was great news and in general I was actually very pleased with where things stand at this point. Fortunately I do not see any signs of active infection locally or systemically which is great news. With that being said I think that she should definitely continue with her antibiotics that were prescribed at the ER I think this can be of utmost importance at this time. Currently the patient is not able to ambulate that is why she is in the wheelchair secondary to the multiple sclerosis. Integumentary (Hair, Skin) Wound #1 status is Open. Original cause of wound  was Blister. The date acquired was: 03/07/2023. The wound is located on the Left Gluteus. The wound measures 1.8cm length x 1cm width x 2.1cm depth; 1.414cm^2 area and 2.969cm^3 volume. There is Fat Layer (Subcutaneous Tissue) exposed. There is tunneling at 12:00 with a maximum distance of 3.2cm. There is a large amount of serous drainage noted. There is medium (34-66%) red granulation within the wound bed. There is a medium (34-66%) amount of necrotic tissue within the wound bed including Adherent Slough. Assessment Active Problems ICD-10 Pressure ulcer of left buttock, stage 3 Multiple sclerosis Procedures Wound #1 Pre-procedure diagnosis of Wound #1 is a Pressure Ulcer located on the Left Gluteus . There was a Chemical/Enzymatic/Mechanical debridement performed by [name], PA-C. With the following instrument(s): saline and gauze after achieving pain control using Lidocaine. Other agent used was saline and gauze. A time out was conducted at 08:45, prior to the start of the procedure. A Minimum amount of bleeding was controlled with Pressure. The procedure was tolerated well. Post Debridement Measurements: 1.8cm length x 1cm width x 2.1cm depth; 2.969cm^3 volume. Post debridement Stage noted as Category/Stage III. Character of Wound/Ulcer Post Debridement is stable. Post procedure Diagnosis Wound #1: Same as Pre-Procedure Plan Follow-up Appointments: Return Appointment in 1 week. Home Health: Home Health Company: - [facility] for wound care. May utilize formulary equivalent dressing for wound treatment orders unless otherwise specified. Home Health Nurse may visit PRN to address patients wound care needs. Scheduled days for dressing changes to be completed; exception, patient has scheduled wound care visit that day. - Monday, Wednesday, Friday **Please direct any NON-WOUND related issues/requests for orders to patient's Primary Care Physician. **If current dressing  causes regression in wound condition, may D/C ordered dressing product/s and apply Normal Saline Moist Dressing daily until next Wound Healing Center or Other MD appointment. **Notify Wound Healing Center of regression in wound condition at [phone]. Bathing/ Shower/ Hygiene: Wash wounds with antibacterial soap and water. Anesthetic (Use 'Patient Medications' Section for Anesthetic Order Entry): Lidocaine applied to wound bed Off-Loading: Roho cushion for wheelchair Hospital bed/mattress Air fluidized (Group 3) - Home Health, please order for patient. Turn and reposition every 2 hours - Float heels on pillow when in the bed. Additional Orders / Instructions: Follow Nutritious Diet and Increase Protein Intake WOUND #1: - Gluteus Wound Laterality: Left Cleanser: Soap and Water (Home Health) 1 x Per Day/30 Days Discharge Instructions: Gently cleanse wound with antibacterial soap, rinse and pat dry prior to dressing wounds Prim Dressing: Hydrofera Blue Ready Transfer Foam, 2.5x2.5 (in/in) (Home Health) 1 x Per Day/30  Days ary Discharge Instructions: Apply Hydrofera Blue Ready to wound bed as directed (tuck small piece in Tunnel 12:00) Secondary Dressing: (BORDER) Zetuvit Plus SILICONE BORDER Dressing 4x4 (in/in) (Home Health) 1 x Per Day/30 Days Discharge Instructions: Please do not put silicone bordered dressings under wraps. Use non-bordered dressing only. ARRIONNA, SERENA ([id number]) [id number] Page 7 of 8 1. Based on what I am seeing I do believe initiating treatment of the [facility] Blue dressing will probably be the best way to go and I discussed that with her today as well. She is in agreement with that plan. 2. I am also going to recommend based on what we are seeing that we have the patient continue to offload is much as she can obviously this requires that the aide often has to be there to help her to move from one side to the other side but  nonetheless it still something that can be beneficial obviously with appropriate offloading. 3. I would recommend a better bed for her and I think that this could be a very good option the question is which will direction to go. I think an upgraded bed however considering she has a current air mattress will probably be ideal. Will check into seeing if her primary care provider can order this if not then we will see what we can do but since they were the original that may be the easiest way and nonresistant is to get the upgraded bed for her. 4. The patient did purchase a Roho cushion which supposed to be on the way using her wheelchair along with her standard cushioning I am hopeful that this will help her as well when she does receive that. We will see patient back for reevaluation in 1 week here in the clinic. If anything worsens or changes patient will contact our office for additional recommendations. Electronic Signature(s) Signed: 03/21/2023 11:20:51 AM By: [name] PA-C Entered By: [name] on 03/21/2023 11:20:51 -------------------------------------------------------------------------------- ROS/PFSH Details Patient Name: Date of Service: [name], [name] 03/21/2023 8:00 A M Medical Record Number: [id number] Patient Account Number: [id number] Date of Birth/Sex: Treating RN: 1971-05-16 (52 y.o. Skip Mayer Primary Care Provider: [name], [name] Other Clinician: Referring Provider: Treating Provider/Extender: Barth Kirks Weeks in Treatment: 0 Information Obtained From Patient Chart Cardiovascular Complaints and Symptoms: Positive for: LE edema Integumentary (Skin) Complaints and Symptoms: Positive for: Wounds - buttock Endocrine Medical History: Negative for: Type II Diabetes Genitourinary Complaints and Symptoms: Review of System Notes: Urostomy Neurologic Medical History: Positive for: Neuropathy Past Medical History Notes: [place]  2014 Immunizations Pneumococcal Vaccine: Received Pneumococcal Vaccination: Yes Received Pneumococcal Vaccination On or After [address]AVNEET, ASHMORE ([id number]) [id number] Page 8 of 8 Implantable Devices None Family and Social History Never smoker; Marital Status - Single; Alcohol Use: Never; Drug Use: No History; Caffeine Use: Never Electronic Signature(s) Signed: 03/21/2023 10:29:59 AM By: Elliot Gurney, BSN, RN, CWS, Kim RN, BSN Signed: 03/21/2023 5:11:00 PM By: [name] PA-C Entered By: Elliot Gurney BSN, RN, CWS, Kim on 03/21/2023 08:22:51 -------------------------------------------------------------------------------- SuperBill Details Patient Name: Date of Service: Beecher Mcardle Pratt, [name] 03/21/2023 Medical Record Number: [id number] Patient Account Number: [id number] Date of Birth/Sex: Treating RN: November 16, 1970 (52 y.o. Skip Mayer Primary Care Provider: [name], [name] Other Clinician: Referring Provider: Treating Provider/Extender: Barth Kirks Weeks in Treatment: 0 Diagnosis Coding ICD-10 Codes Code Description [phone] Pressure ulcer of left buttock, stage 3 G35 Multiple sclerosis Facility Procedures : CPT4  Code: [id number] Description: 99214 - WOUND CARE VISIT-LEV 4 EST PT Modifier: Quantity: 1 Physician Procedures : CPT4 Code Description Modifier [id number] WC PHYS LEVEL 3 NEW PT ICD-10 Diagnosis Description L89.323 Pressure ulcer of left buttock, stage 3 G35 Multiple sclerosis Quantity: 1 Electronic Signature(s) Signed: 03/21/2023 11:22:13 AM By: [name] PA-C Previous Signature: 03/21/2023 9:56:20 AM Version By: Elliot Gurney, BSN, RN, CWS, Kim RN, BSN Entered By: [name] on 03/21/2023 11:22:13

## 2023-03-21 NOTE — Progress Notes (Signed)
[name], [name] ([id number]) [phone] [email or web address] Page 1 of 5 Visit Report for 03/21/2023 Abuse Risk Screen Details Patient Name: Date of Service: [name], [name] 03/21/2023 8:00 A M Medical Record Number: [id number] Patient Account Number: [id number] Date of Birth/Sex: Treating RN: [birth year] (52 y.o. Skip Mayer Primary Care : [name], [name] Other Clinician: Referring : Treating /Extender: Barth Kirks Weeks in Treatment: 0 Abuse Risk Screen Items Answer ABUSE RISK SCREEN: Has anyone close to you tried to hurt or harm you recentlyo No Do you feel uncomfortable with anyone in your familyo No Has anyone forced you do things that you didnt want to doo No Electronic Signature(s) Signed: 03/21/2023 10:29:59 AM By: Elliot Gurney, BSN, RN, CWS, Kim RN, BSN Entered By: Elliot Gurney, BSN, RN, CWS, Kim on 03/21/2023 08:22:58 -------------------------------------------------------------------------------- Activities of Daily Living Details Patient Name: Date of Service: [name], [name] 03/21/2023 8:00 A M Medical Record Number: [id number] Patient Account Number: [id number] Date of Birth/Sex: Treating RN: December 15, 1970 (52 y.o. Skip Mayer Primary Care : [name], [name] Other Clinician: Referring : Treating /Extender: Barth Kirks Weeks in Treatment: 0 Activities of Daily Living Items Answer Activities of Daily Living (Please select one for each item) Drive Automobile Not Able T Medications ake Completely Able Use T elephone Completely Able Care for Appearance Need Assistance Use T oilet Need Assistance Bath / Shower Need Assistance Dress Self Need Assistance Feed Self Completely Able Walk Not Able Get In / Out Bed Need Assistance Housework Need Assistance ALEINA, BURGIO ([id number]) [phone] [email or web address] Page 2 of 5 Prepare Meals Need Assistance Handle Money Need  Assistance Shop for Self Need Hydrographic surveyor) Signed: 03/21/2023 10:29:59 AM By: Elliot Gurney, BSN, RN, CWS, Kim RN, BSN Entered By: Elliot Gurney, BSN, RN, CWS, Kim on 03/21/2023 08:24:02 -------------------------------------------------------------------------------- Education Screening Details Patient Name: Date of Service: [name], [name] 03/21/2023 8:00 A M Medical Record Number: [id number] Patient Account Number: [id number] Date of Birth/Sex: Treating RN: 1970-11-06 (52 y.o. Skip Mayer Primary Care : [name], [name] Other Clinician: Referring : Treating /Extender: Lucilla Lame in Treatment: 0 Primary Learner Assessed: Patient Learning Preferences/Education Level/Primary Language Learning Preference: Explanation Highest Education Level: High School Preferred Language: English Cognitive Barrier Language Barrier: No Translator Needed: No Memory Deficit: No Emotional Barrier: No Physical Barrier Impaired Vision: No Impaired Hearing: No Decreased Hand dexterity: No Knowledge/Comprehension Knowledge Level: High Comprehension Level: High Ability to understand written instructions: High Ability to understand verbal instructions: High Motivation Anxiety Level: Calm Cooperation: Cooperative Education Importance: Acknowledges Need Interest in Health Problems: Asks Questions Perception: Coherent Willingness to Engage in Self-Management High Activities: Readiness to Engage in Self-Management High Activities: Electronic Signature(s) Signed: 03/21/2023 10:29:59 AM By: Elliot Gurney, BSN, RN, CWS, Kim RN, BSN Entered By: Elliot Gurney, BSN, RN, CWS, Kim on 03/21/2023 08:24:29 [name], [name] ([id number]) [phone] [email or web address] Page 3 of 5 -------------------------------------------------------------------------------- Fall Risk Assessment Details Patient Name: Date of Service: [name], [name] 03/21/2023 8:00 A  M Medical Record Number: [id number] Patient Account Number: [id number] Date of Birth/Sex: Treating RN: [birth year] (52 y.o. Skip Mayer Primary Care : [name], [name] Other Clinician: Referring : Treating /Extender: Barth Kirks Weeks in Treatment: 0 Fall Risk Assessment Items Have you had 2 or more falls in the last 12 monthso 0 No Have you had any fall that resulted in injury in the last 12 monthso 0 No FALLS RISK SCREEN History  of falling - immediate or within 3 months 0 No Secondary diagnosis (Do you have 2 or more medical diagnoseso) 15 Yes Ambulatory aid None/bed rest/wheelchair/nurse 0 Yes Crutches/cane/walker 0 No Furniture 0 No Intravenous therapy Access/Saline/Heparin Lock 0 No Gait/Transferring Normal/ bed rest/ wheelchair 0 Yes Weak (short steps with or without shuffle, stooped but able to lift head while walking, may seek 0 No support from furniture) Impaired (short steps with shuffle, may have difficulty arising from chair, head down, impaired 0 No balance) Mental Status Oriented to own ability 0 Yes Electronic Signature(s) Signed: 03/21/2023 10:29:59 AM By: Elliot Gurney, BSN, RN, CWS, Kim RN, BSN Entered By: Elliot Gurney, BSN, RN, CWS, Kim on 03/21/2023 08:24:52 -------------------------------------------------------------------------------- Foot Assessment Details Patient Name: Date of Service: [name], [name] 03/21/2023 8:00 A M Medical Record Number: [id number] Patient Account Number: [id number] Date of Birth/Sex: Treating RN: Feb 14, 1971 (52 y.o. Skip Mayer Primary Care : [name], [name] Other Clinician: Referring : Treating /Extender: Barth Kirks Weeks in Treatment: 0 Foot Assessment Items Site Locations [place] [place] ([id number]) [id number] Page 4 of 5 + = Sensation present, - = Sensation absent, C = Callus, U = Ulcer R = Redness, W = Warmth, M =  Maceration, PU = Pre-ulcerative lesion F = Fissure, S = Swelling, D = Dryness Assessment Right: Left: Other Deformity: No No Prior Foot Ulcer: No No Prior Amputation: No No Charcot Joint: No No Ambulatory Status: Non-ambulatory Assistance Device: Wheelchair Gait: Notes Patient does not walk at all. Electronic Signature(s) Signed: 03/21/2023 10:29:59 AM By: Elliot Gurney, BSN, RN, CWS, Kim RN, BSN Entered By: Elliot Gurney, BSN, RN, CWS, Kim on 03/21/2023 08:38:57 -------------------------------------------------------------------------------- Nutrition Risk Screening Details Patient Name: Date of Service: [name], [name] 03/21/2023 8:00 A M Medical Record Number: [id number] Patient Account Number: [id number] Date of Birth/Sex: Treating RN: May 19, 1971 (52 y.o. Skip Mayer Primary Care : [name], [name] Other Clinician: Referring : Treating /Extender: Barth Kirks Weeks in Treatment: 0 Height (in): 64 Weight (lbs): 166 Body Mass Index (BMI): 28.5 Nutrition Risk Screening Items Score Screening NUTRITION RISK SCREEN: I have an illness or condition that made me change the kind and/or amount of food I eat 0 No YAMILETTE, GARRETSON ([id number]) [phone] [id number] Page 5 of 5 I eat fewer than two meals per day 0 No I eat few fruits and vegetables, or milk products 0 No I have three or more drinks of beer, liquor or wine almost every day 0 No I have tooth or mouth problems that make it hard for me to eat 0 No I don't always have enough money to buy the food I need 0 No I eat alone most of the time 0 No I take three or more different prescribed or over-the-counter drugs a day 1 Yes Without wanting to, I have lost or gained 10 pounds in the last six months 2 Yes I am not always physically able to shop, cook and/or feed myself 0 No Nutrition Protocols Good Risk Protocol 0 No interventions needed Moderate Risk Protocol High Risk  Proctocol Risk Level: Moderate Risk Score: 3 Electronic Signature(s) Signed: 03/21/2023 10:29:59 AM By: Elliot Gurney, BSN, RN, CWS, Kim RN, BSN Entered By: Elliot Gurney, BSN, RN, CWS, Kim on 03/21/2023 08:26:13

## 2023-03-27 ENCOUNTER — Ambulatory Visit: Payer: 59 | Admitting: Physician Assistant

## 2023-03-28 ENCOUNTER — Encounter: Payer: 59 | Attending: Physician Assistant | Admitting: Physician Assistant

## 2023-03-28 DIAGNOSIS — L89323 Pressure ulcer of left buttock, stage 3: Secondary | ICD-10-CM | POA: Insufficient documentation

## 2023-03-28 DIAGNOSIS — Z6828 Body mass index (BMI) 28.0-28.9, adult: Secondary | ICD-10-CM | POA: Insufficient documentation

## 2023-03-28 DIAGNOSIS — E669 Obesity, unspecified: Secondary | ICD-10-CM | POA: Insufficient documentation

## 2023-03-28 DIAGNOSIS — G35 Multiple sclerosis: Secondary | ICD-10-CM | POA: Diagnosis not present

## 2023-03-28 NOTE — Progress Notes (Addendum)
Samantha, Pratt ([id number]) [id number] Page 1 of 10 Visit Report for 03/28/2023 Arrival Information Details Patient Name: Date of Service: [name], IDOL 03/28/2023 12:30 PM Medical Record Number: [id number] Patient Account Number: [id number] Date of Birth/Sex: Treating RN: 17-Jul-1971 (52 y.o. [name] Primary Care : [name], [name] Other Clinician: Referring : Treating /Extender: [name] GO [place], [place] [name] in Treatment: 1 Visit Information History Since Last Visit Added or deleted any medications: No Patient Arrived: Wheel Chair Any new allergies or adverse reactions: No Arrival Time: 12:22 Had a fall or experienced change in No Accompanied By: self activities of daily living that may affect Transfer Assistance: None risk of falls: Patient Identification Verified: Yes Signs or symptoms of abuse/neglect since last visito No Secondary Verification Process Completed: Yes Hospitalized since last visit: No Patient Requires Transmission-Based Precautions: No Implantable device outside of the clinic excluding No Patient Has Alerts: Yes cellular tissue based products placed in the center Patient Alerts: NOT DIABETIC since last visit: Has Dressing in Place as Prescribed: Yes Pain Present Now: No Electronic Signature(s) Signed: 03/28/2023 12:42:53 PM By: Yevonne Pax RN Entered By: Yevonne Pax on 03/28/2023 12:42:52 -------------------------------------------------------------------------------- Clinic Level of Care Assessment Details Patient Name: Date of Service: [name], BEYEA 03/28/2023 12:30 PM Medical Record Number: [id number] Patient Account Number: [id number] Date of Birth/Sex: Treating RN: 1971-04-28 (52 y.o. [name] Primary Care : Bertram Denver Other Clinician: Referring : Treating /Extender: [name] GO [place], [place] [name] in Treatment: 1 Clinic Level of  Care Assessment Items TOOL 4 Quantity Score X- 1 0 Use when only an EandM is performed on FOLLOW-UP visit ASSESSMENTS - Nursing Assessment / Reassessment X- 1 10 Reassessment of Co-morbidities (includes updates in patient status) X- 1 5 Reassessment of Adherence to Treatment Plan Samantha, Pratt ([id number]) [id number] Page 2 of 10 ASSESSMENTS - Wound and Skin A ssessment / Reassessment X - Simple Wound Assessment / Reassessment - one wound 1 5 []  - 0 Complex Wound Assessment / Reassessment - multiple wounds []  - 0 Dermatologic / Skin Assessment (not related to wound area) ASSESSMENTS - Focused Assessment []  - 0 Circumferential Edema Measurements - multi extremities []  - 0 Nutritional Assessment / Counseling / Intervention []  - 0 Lower Extremity Assessment (monofilament, tuning fork, pulses) []  - 0 Peripheral Arterial Disease Assessment (using hand held doppler) ASSESSMENTS - Ostomy and/or Continence Assessment and Care []  - 0 Incontinence Assessment and Management []  - 0 Ostomy Care Assessment and Management (repouching, etc.) PROCESS - Coordination of Care X - Simple Patient / Family Education for ongoing care 1 15 []  - 0 Complex (extensive) Patient / Family Education for ongoing care []  - 0 Staff obtains Chiropractor, Records, T Results / Process Orders est []  - 0 Staff telephones HHA, Nursing Homes / Clarify orders / etc []  - 0 Routine Transfer to another Facility (non-emergent condition) []  - 0 Routine Hospital Admission (non-emergent condition) []  - 0 New Admissions / Manufacturing engineer / Ordering NPWT Apligraf, etc. , []  - 0 Emergency Hospital Admission (emergent condition) X- 1 10 Simple Discharge Coordination []  - 0 Complex (extensive) Discharge Coordination PROCESS - Special Needs []  - 0 Pediatric / Minor Patient Management []  - 0 Isolation Patient Management []  - 0 Hearing / Language / Visual special needs []  -  0 Assessment of Community assistance (transportation, D/C planning, etc.) []  - 0 Additional assistance / Altered mentation []  - 0 Support Surface(s) Assessment (bed,  cushion, seat, etc.) INTERVENTIONS - Wound Cleansing / Measurement X - Simple Wound Cleansing - one wound 1 5 []  - 0 Complex Wound Cleansing - multiple wounds X- 1 5 Wound Imaging (photographs - any number of wounds) []  - 0 Wound Tracing (instead of photographs) X- 1 5 Simple Wound Measurement - one wound []  - 0 Complex Wound Measurement - multiple wounds INTERVENTIONS - Wound Dressings X - Small Wound Dressing one or multiple wounds 1 10 []  - 0 Medium Wound Dressing one or multiple wounds []  - 0 Large Wound Dressing one or multiple wounds X- 1 5 Application of Medications - topical []  - 0 Application of Medications - injection INTERVENTIONS - Miscellaneous []  - 0 External ear exam SAILOR, KALLENBERGER ([id number]) [id number] Page 3 of 10 []  - 0 Specimen Collection (cultures, biopsies, blood, body fluids, etc.) []  - 0 Specimen(s) / Culture(s) sent or taken to Lab for analysis []  - 0 Patient Transfer (multiple staff / Michiel Sites Lift / Similar devices) []  - 0 Simple Staple / Suture removal (25 or less) []  - 0 Complex Staple / Suture removal (26 or more) []  - 0 Hypo / Hyperglycemic Management (close monitor of Blood Glucose) []  - 0 Ankle / Brachial Index (ABI) - do not check if billed separately X- 1 5 Vital Signs Has the patient been seen at the hospital within the last three years: Yes Total Score: 80 Level Of Care: New/Established - Level 3 Electronic Signature(s) Signed: 03/28/2023 3:15:47 PM By: Yevonne Pax RN Entered By: Yevonne Pax on 03/28/2023 13:29:59 -------------------------------------------------------------------------------- Encounter Discharge Information Details Patient Name: Date of Service: [name], [name] 03/28/2023 12:30 PM Medical Record Number:  [id number] Patient Account Number: [id number] Date of Birth/Sex: Treating RN: 08-24-1971 (52 y.o. [name] Primary Care : [name], [name] Other Clinician: Referring : Treating /Extender: [name] [place], [place] [name] in Treatment: 1 Encounter Discharge Information Items Discharge Condition: Stable Ambulatory Status: Wheelchair Discharge Destination: Home Transportation: Private Auto Accompanied By: self Schedule Follow-up Appointment: Yes Clinical Summary of Care: Electronic Signature(s) Signed: 03/28/2023 1:31:09 PM By: Yevonne Pax RN Entered By: Yevonne Pax on 03/28/2023 13:31:09 -------------------------------------------------------------------------------- Lower Extremity Assessment Details Patient Name: Date of Service: Samantha [name] Pratt, ROYELLE WADE. 03/28/2023 12:30 PM ARIELA, BENDON L ([id number]) [id number] Page 4 of 10 Medical Record Number: [id number] Patient Account Number: [id number] Date of Birth/Sex: Treating RN: [birth year] (52 y.o. [name] Primary Care : Bertram Denver Other Clinician: Referring : Treating /Extender: [name] [place], [place] [name] in Treatment: 1 Electronic Signature(s) Signed: 03/28/2023 12:47:10 PM By: Yevonne Pax RN Entered By: Yevonne Pax on 03/28/2023 12:47:10 -------------------------------------------------------------------------------- Multi Wound Chart Details Patient Name: Date of Service: Beecher Mcardle Pratt, Edilia L. 03/28/2023 12:30 PM Medical Record Number: [id number] Patient Account Number: [id number] Date of Birth/Sex: Treating RN: [birth year] (52 y.o. [name] Primary Care : [name], [name] Other Clinician: Referring : Treating /Extender: [name] [place], [place] [name] in Treatment: 1 Vital Signs Height(in): 64 Pulse(bpm): 98 Weight(lbs): 166 Blood Pressure(mmHg): 136/80 Body Mass Index(BMI):  28.5 Temperature(F): 98.3 Respiratory Rate(breaths/min): 16 [1:Photos:] [N/A:N/A] Left Gluteus N/A N/A Wound Location: Blister N/A N/A Wounding Event: Pressure Ulcer N/A N/A Primary Etiology: Neuropathy N/A N/A Comorbid History: 03/07/2023 N/A N/A Date Acquired: 1 N/A N/A Weeks of Treatment: Open N/A N/A Wound Status: No N/A N/A Wound Recurrence: 1.8x4.5x2.1 N/A N/A Measurements L x W x D (cm) 6.362 N/A N/A A (cm) : rea  13.36 N/A N/A Volume (cm) : -349.90% N/A N/A % Reduction in A rea: -350.00% N/A N/A % Reduction in Volume: Category/Stage III N/A N/A Classification: Medium N/A N/A Exudate A mount: Serosanguineous N/A N/A Exudate Type: red, brown N/A N/A Exudate Color: Large (67-100%) N/A N/A Granulation A mount: Red N/A N/A Granulation Quality: Small (1-33%) N/A N/A Necrotic A mount: Fat Layer (Subcutaneous Tissue): Yes N/A N/A Exposed Structures: Fascia: No Tendon: No Muscle: No Joint: No Bone: No SHERECE, HOPPER ([id number]) [id number] Page 5 of 10 None N/A N/A Epithelialization: Treatment Notes Electronic Signature(s) Signed: 03/28/2023 12:47:18 PM By: Yevonne Pax RN Entered By: Yevonne Pax on 03/28/2023 12:47:18 -------------------------------------------------------------------------------- Multi-Disciplinary Care Plan Details Patient Name: Date of Service: Samantha [name] Pratt, Kathyjo L. 03/28/2023 12:30 PM Medical Record Number: [id number] Patient Account Number: [id number] Date of Birth/Sex: Treating RN: 04/05/1971 (52 y.o. [name] Primary Care : [name], [name] Other Clinician: Referring : Treating /Extender: [name] [place], [place] [name] in Treatment: 1 Active Inactive Peripheral Neuropathy Nursing Diagnoses: Knowledge deficit related to disease process and management of peripheral neurovascular dysfunction Potential alteration in peripheral tissue perfusion (select prior to  confirmation of diagnosis) Goals: Patient/caregiver will verbalize understanding of disease process and disease management Date Initiated: 03/21/2023 Target Resolution Date: 04/20/2023 Goal Status: Active Interventions: Assess signs and symptoms of neuropathy upon admission and as needed Provide education on Management of Neuropathy and Related Ulcers Provide education on Management of Neuropathy upon discharge from the Wound Center Notes: Pressure Nursing Diagnoses: Knowledge deficit related to causes and risk factors for pressure ulcer development Knowledge deficit related to management of pressures ulcers Potential for impaired tissue integrity related to pressure, friction, moisture, and shear Goals: Patient will remain free from development of additional pressure ulcers Date Initiated: 03/21/2023 Target Resolution Date: 04/20/2023 Goal Status: Active Patient/caregiver will verbalize understanding of pressure ulcer management Date Initiated: 03/21/2023 Target Resolution Date: 04/20/2023 Goal Status: Active Interventions: Assess: immobility, friction, shearing, incontinence upon admission and as needed Assess offloading mechanisms upon admission and as needed Assess potential for pressure ulcer upon admission and as needed Provide education on pressure ulcers Treatment Activities: CATHERINA, VEREB ([id number]) [id number] Page 6 of 10 Patient referred for home evaluation of offloading devices/mattresses : 03/21/2023 Notes: Soft Tissue Infection Nursing Diagnoses: Impaired tissue integrity Knowledge deficit related to disease process and management Knowledge deficit related to home infection control: handwashing, handling of soiled dressings, supply storage Potential for infection: soft tissue Goals: Patient will remain free of wound infection Date Initiated: 03/21/2023 Target Resolution Date: 05/21/2023 Goal Status: Active Patient/caregiver will  verbalize understanding of or measures to prevent infection and contamination in the home setting Date Initiated: 03/21/2023 Date Inactivated: 03/28/2023 Target Resolution Date: 03/21/2023 Goal Status: Met Patient's soft tissue infection will resolve Date Initiated: 03/21/2023 Target Resolution Date: 03/31/2023 Goal Status: Active Signs and symptoms of infection will be recognized early to allow for prompt treatment Date Initiated: 03/21/2023 Date Inactivated: 03/28/2023 Target Resolution Date: 03/21/2023 Goal Status: Met Interventions: Assess signs and symptoms of infection every visit Provide education on infection Treatment Activities: Systemic antibiotics : 03/21/2023 Notes: Wound/Skin Impairment Nursing Diagnoses: Impaired tissue integrity Knowledge deficit related to smoking impact on wound healing Knowledge deficit related to ulceration/compromised skin integrity Goals: Patient/caregiver will verbalize understanding of skin care regimen Date Initiated: 03/21/2023 Target Resolution Date: 05/21/2023 Goal Status: Active Ulcer/skin breakdown will have a volume reduction of 30% by week 4 Date Initiated: 03/21/2023 Target Resolution Date: 04/18/2023 Goal Status: Active Ulcer/skin  breakdown will have a volume reduction of 50% by week 8 Date Initiated: 03/21/2023 Target Resolution Date: 05/16/2023 Goal Status: Active Ulcer/skin breakdown will have a volume reduction of 80% by week 12 Date Initiated: 03/21/2023 Target Resolution Date: 06/13/2023 Goal Status: Active Ulcer/skin breakdown will heal within 14 weeks Date Initiated: 03/21/2023 Target Resolution Date: 06/27/2023 Goal Status: Active Interventions: Assess patient/caregiver ability to obtain necessary supplies Assess patient/caregiver ability to perform ulcer/skin care regimen upon admission and as needed Assess ulceration(s) every visit Treatment Activities: Patient referred to home care : 03/21/2023 Skin care regimen initiated :  03/21/2023 Notes: Electronic Signature(s) Signed: 03/28/2023 12:49:48 PM By: Yevonne Pax RN Entered By: Yevonne Pax on 03/28/2023 12:49:48 [name] ([id number]) [id number] Page 7 of 10 -------------------------------------------------------------------------------- Pain Assessment Details Patient Name: Date of Service: OLANNA, SCHWERDT 03/28/2023 12:30 PM Medical Record Number: [id number] Patient Account Number: [id number] Date of Birth/Sex: Treating RN: [birth year] (52 y.o. [name] Primary Care : [name], [name] Other Clinician: Referring : Treating /Extender: [name] GO [place], [place] [name] in Treatment: 1 Active Problems Location of Pain Severity and Description of Pain Patient Has Paino No Site Locations Pain Management and Medication Current Pain Management: Electronic Signature(s) Signed: 03/28/2023 12:43:54 PM By: Yevonne Pax RN Entered By: Yevonne Pax on 03/28/2023 12:43:54 -------------------------------------------------------------------------------- Patient/Caregiver Education Details Patient Name: Date of Service: Samantha Lucius Conn 7/2/2024andnbsp12:30 PM Medical Record Number: [id number] Patient Account Number: [id number] Date of Birth/Gender: Treating RN: 1971/01/21 (52 y.o. [name] Primary Care Physician: Bertram Denver Other Clinician: Referring Physician: Treating Physician/Extender: [name] GO [place], [place] [name] in Treatment: 1 [name], KOVACEVIC ([id number]) [id number] Page 8 of 10 Education Assessment Education Provided To: Patient Education Topics Provided Pressure: Handouts: Pressure Injury: Prevention and Offloading Methods: Explain/Verbal Responses: State content correctly Wound Debridement: Handouts: Wound Debridement Methods: Explain/Verbal Responses: State content correctly Wound/Skin Impairment: Handouts: Caring for Your  Ulcer Methods: Explain/Verbal Responses: State content correctly Electronic Signature(s) Signed: 03/28/2023 3:15:47 PM By: Yevonne Pax RN Entered By: Yevonne Pax on 03/28/2023 12:50:13 -------------------------------------------------------------------------------- Wound Assessment Details Patient Name: Date of Service: AAIMA, HASE 03/28/2023 12:30 PM Medical Record Number: [id number] Patient Account Number: [id number] Date of Birth/Sex: Treating RN: [birth year] (52 y.o. [name] Primary Care : [name], [name] Other Clinician: Referring : Treating /Extender: [name] GO [place], [place] UREN Weeks in Treatment: 1 Wound Status Wound Number: 1 Primary Etiology: Pressure Ulcer Wound Location: Left Gluteus Wound Status: Open Wounding Event: Blister Comorbid History: Neuropathy Date Acquired: 03/07/2023 Weeks Of Treatment: 1 Clustered Wound: No Photos Wound Measurements TAFFIE, WERNIMONT L ([id number]) Length: (cm) 1.8 Width: (cm) 4.5 Depth: (cm) 2.1 Area: (cm) 6.362 Volume: (cm) 13.36 [id number] Page 9 of 10 % Reduction in Area: -349.9% % Reduction in Volume: -350% Epithelialization: None Tunneling: No Undermining: No Wound Description Classification: Category/Stage III Exudate Amount: Medium Exudate Type: Serosanguineous Exudate Color: red, brown Foul Odor After Cleansing: No Slough/Fibrino Yes Wound Bed Granulation Amount: Large (67-100%) Exposed Structure Granulation Quality: Red Fascia Exposed: No Necrotic Amount: Small (1-33%) Fat Layer (Subcutaneous Tissue) Exposed: Yes Necrotic Quality: Adherent Slough Tendon Exposed: No Muscle Exposed: No Joint Exposed: No Bone Exposed: No Treatment Notes Wound #1 (Gluteus) Wound Laterality: Left Cleanser Soap and Water Discharge Instruction: Gently cleanse wound with antibacterial soap, rinse and pat dry prior to dressing wounds Peri-Wound Care AandD  Ointment Discharge Instruction: posterior upper thigh , thin layer Topical Primary Dressing Hydrofera Blue  Ready Transfer Foam, 2.5x2.5 (in/in) Discharge Instruction: 1/2 hydrofera blue rope inside wound, then top with foam hydrofera blue Secondary Dressing ABD Pad 5x9 (in/in) Discharge Instruction: Cover with ABD pad Secured With Medipore T - 67M Medipore H Soft Cloth Surgical T ape ape, 2x2 (in/yd) Compression Wrap Compression Stockings Add-Ons Electronic Signature(s) Signed: 03/28/2023 12:46:54 PM By: Yevonne Pax RN Entered By: Yevonne Pax on 03/28/2023 12:46:54 -------------------------------------------------------------------------------- Vitals Details Patient Name: Date of Service: Samantha [name] Pratt, Axel L. 03/28/2023 12:30 PM Medical Record Number: [id number] Patient Account Number: [id number] Date of Birth/Sex: Treating RN: December 08, 1970 (52 y.o. [name] Primary Care : [name], [name] Other Clinician: DHANVI, BUCKMAN ([id number]) [id number] Page 10 of 10 Referring : Treating /Extender: [name] [place], [place] [name] in Treatment: 1 Vital Signs Time Taken: 12:22 Temperature (F): 98.3 Height (in): 64 Pulse (bpm): 98 Weight (lbs): 166 Respiratory Rate (breaths/min): 16 Body Mass Index (BMI): 28.5 Blood Pressure (mmHg): 136/80 Reference Range: 80 - 120 mg / dl Electronic Signature(s) Signed: 03/28/2023 12:43:47 PM By: Yevonne Pax RN Entered By: Yevonne Pax on 03/28/2023 12:43:47

## 2023-03-28 NOTE — Progress Notes (Addendum)
CATHLEEN, NIEHUES ([id number]) [id number] Page 1 of 6 Visit Report for 03/28/2023 Chief Complaint Document Details Patient Name: Date of Service: [name], [name] 03/28/2023 12:30 PM Medical Record Number: [id number] Patient Account Number: [id number] Date of Birth/Sex: Treating RN: 08-28-1971 (52 y.o. [name] Primary Care Provider: [name], [name] Other Clinician: Referring Provider: Treating Provider/Extender: [name] GO [place], [place] [name] in Treatment: 1 Information Obtained from: Patient Chief Complaint Pressure ulcer left gluteal region Electronic Signature(s) Signed: 03/28/2023 12:36:49 PM By: [name] PA-C Entered By: [name] on 03/28/2023 12:36:49 -------------------------------------------------------------------------------- HPI Details Patient Name: Date of Service: [name], [name] 03/28/2023 12:30 PM Medical Record Number: [id number] Patient Account Number: [id number] Date of Birth/Sex: Treating RN: [birth year] (52 y.o. [name] Primary Care Provider: [name], [name] Other Clinician: Referring Provider: Treating Provider/Extender: [name] GO [place], [place] [name] in Treatment: 1 History of Present Illness HPI Description: 03-21-2023 upon evaluation today patient appears for initial evaluation regarding a wound that actually began around 03/07/2023. With that being said she has been having issues here with this area and then subsequently ended up in the hospital due to a high fever. She was discharged from the hospital and then started on oral antibiotics that was on 03-16-2023. The oral antibiotics are doxycycline and Keflex that she is currently taking. With that being said she was diagnosed with multiple sclerosis in 2000 [name] she has been in a well motorized wheelchair since 2014. She is a pleasant individual and unfortunately this is the issue that she is having currently with a pressure ulcer although  this is the good news is it is the first time she has had this happen she has an excellent caregiver that takes great care of her and the family is very attentive. 03-28-2023 upon evaluation today patient appears to be doing worse with regard to the irritation around the edges of her wound. The good news is the wound itself does not appear worse but the irritation is definitely not as good as even what I saw last week. In the interim since last week [name] her aide has actually broken her wrist and is no longer helping with the dressing changes I think this is part of the issue here. Fortunately I do not see any evidence of infection locally or systemically which is good news. Electronic Signature(s) Signed: 03/28/2023 1:05:38 PM By: Michela Pitcher, Blimi L ([id number]) [id number] Page 2 of 6 Entered By: [name] on 03/28/2023 13:05:37 -------------------------------------------------------------------------------- Physical Exam Details Patient Name: Date of Service: [name], [name] 03/28/2023 12:30 PM Medical Record Number: [id number] Patient Account Number: [id number] Date of Birth/Sex: Treating RN: 1971-05-17 (52 y.o. [name] Primary Care Provider: [name], [name] Other Clinician: Referring Provider: Treating Provider/Extender: [name] [place], [place] [name] in Treatment: 1 Constitutional Well-nourished and well-hydrated in no acute distress. Respiratory normal breathing without difficulty. Psychiatric this patient is able to make decisions and demonstrates good insight into disease process. Alert and Oriented x 3. pleasant and cooperative. Notes Upon inspection patient's wound bed actually showed signs of good granulation and epithelization at this point. Fortunately I do not see any evidence of infection locally or systemically which is great news. With that being said there is some irritation around the edges of the wound I  am afraid that the Zetuvit dressings may actually be causing some irritation here I would recommend switching to an ABD pad which  I think will be softer and we will see how things go with that. I am also can recommend AandD ointment to the irritated and excoriated skin. Electronic Signature(s) Signed: 03/28/2023 1:07:12 PM By: [name] PA-C Entered By: [name] on 03/28/2023 13:07:11 -------------------------------------------------------------------------------- Physician Orders Details Patient Name: Date of Service: [name], [name] 03/28/2023 12:30 PM Medical Record Number: [id number] Patient Account Number: [id number] Date of Birth/Sex: Treating RN: [birth year] (52 y.o. [name] Primary Care Provider: [name], [name] Other Clinician: Referring Provider: Treating Provider/Extender: [name] [place], [place] [name] in Treatment: 1 Verbal / Phone Orders: No Diagnosis Coding ICD-10 Coding Code Description [phone] Pressure ulcer of left buttock, stage 3 G35 Multiple sclerosis [name], [name] ([id number]) [id number] Page 3 of 6 Follow-up Appointments Return Appointment in 1 week. Home Health Home Health Company: - [facility] fax: [phone] [facility] for wound care. May utilize formulary equivalent dressing for wound treatment orders unless otherwise specified. Home Health Nurse may visit PRN to address patients wound care needs. Scheduled days for dressing changes to be completed; exception, patient has scheduled wound care visit that day. - Monday, Wednesday, Friday **Please direct any NON-WOUND related issues/requests for orders to patient's Primary Care Physician. **If current dressing causes regression in wound condition, may D/C ordered dressing product/s and apply Normal Saline Moist Dressing daily until next Wound Healing Center or Other MD appointment. **Notify Wound Healing Center of regression in wound condition at  [phone]. Bathing/ [employer] wounds with antibacterial soap and water. Anesthetic (Use 'Patient Medications' Section for Anesthetic Order Entry) Lidocaine applied to wound bed Off-Loading Roho cushion for wheelchair Hospital bed/mattress A fluidized (Group 3) - Home Health, please order for patient. ir Turn and reposition every 2 hours - Float heels on pillow when in the bed. Additional Orders / Instructions Follow Nutritious Diet and Increase Protein Intake Wound Treatment Wound #1 - Gluteus Wound Laterality: Left Cleanser: Soap and Water (Home Health) 1 x Per Day/30 Days Discharge Instructions: Gently cleanse wound with antibacterial soap, rinse and pat dry prior to dressing wounds Peri-Wound Care: AandD Ointment 1 x Per Day/30 Days Discharge Instructions: posterior upper thigh , thin layer Prim Dressing: Hydrofera Blue Ready Transfer Foam, 2.5x2.5 (in/in) (Home Health) 1 x Per Day/30 Days ary Discharge Instructions: 1/2 hydrofera blue rope inside wound, then top with foam hydrofera blue Secondary Dressing: ABD Pad 5x9 (in/in) 1 x Per Day/30 Days Discharge Instructions: Cover with ABD pad Secured With: Medipore T - 60M Medipore H Soft Cloth Surgical T ape ape, 2x2 (in/yd) 1 x Per Day/30 Days Electronic Signature(s) Signed: 03/31/2023 8:22:18 AM By: [name] PA-C Signed: 05/02/2023 3:49:02 PM By: Yevonne Pax RN Previous Signature: 03/28/2023 1:27:19 PM Version By: Yevonne Pax RN Previous Signature: 03/28/2023 5:13:07 PM Version By: [name] PA-C Entered By: Yevonne Pax on 03/29/2023 10:57:50 -------------------------------------------------------------------------------- Problem List Details Patient Name: Date of Service: [name], [name] 03/28/2023 12:30 PM Medical Record Number: [id number] Patient Account Number: [id number] Date of Birth/Sex: Treating RN: 23-Nov-1970 (52 y.o. [name] Primary Care Provider: Bertram Denver Other Clinician: Referring  Provider: Treating Provider/Extender: [name] [place] [name] in Treatment: 1 Active Problems [name], [name] ([id number]) [id number] Page 4 of 6 ICD-10 Encounter Code Description Active Date MDM Diagnosis L89.323 Pressure ulcer of left buttock, stage 3 03/21/2023 No Yes G35 Multiple sclerosis 03/21/2023 No Yes Inactive Problems Resolved Problems Electronic Signature(s) Signed: 03/28/2023 12:36:43 PM By: [name]  PA-C Entered By: [name] on 03/28/2023 12:36:43 -------------------------------------------------------------------------------- Progress Note Details Patient Name: Date of Service: [name], [name] 03/28/2023 12:30 PM Medical Record Number: [id number] Patient Account Number: [id number] Date of Birth/Sex: Treating RN: 02/26/1971 (52 y.o. [name] Primary Care Provider: [name], [name] Other Clinician: Referring Provider: Treating Provider/Extender: [name] [place], [place] [name] in Treatment: 1 Subjective Chief Complaint Information obtained from Patient Pressure ulcer left gluteal region History of Present Illness (HPI) 03-21-2023 upon evaluation today patient appears for initial evaluation regarding a wound that actually began around 03/07/2023. With that being said she has been having issues here with this area and then subsequently ended up in the hospital due to a high fever. She was discharged from the hospital and then started on oral antibiotics that was on 03-16-2023. The oral antibiotics are doxycycline and Keflex that she is currently taking. With that being said she was diagnosed with multiple sclerosis in 2000 [name] she has been in a well motorized wheelchair since 2014. She is a pleasant individual and unfortunately this is the issue that she is having currently with a pressure ulcer although this is the good news is it is the first time she has had this happen she has an excellent caregiver that  takes great care of her and the family is very attentive. 03-28-2023 upon evaluation today patient appears to be doing worse with regard to the irritation around the edges of her wound. The good news is the wound itself does not appear worse but the irritation is definitely not as good as even what I saw last week. In the interim since last week [name] her aide has actually broken her wrist and is no longer helping with the dressing changes I think this is part of the issue here. Fortunately I do not see any evidence of infection locally or systemically which is good news. Objective Constitutional Well-nourished and well-hydrated in no acute distress. Vitals Time Taken: 12:22 PM, Height: 64 in, Weight: 166 lbs, BMI: 28.5, Temperature: 98.3 F, Pulse: 98 bpm, Respiratory Rate: 16 breaths/min, Blood Pressure: 136/80 mmHg. [name], [name] ([id number]) [id number] Page 5 of 6 Respiratory normal breathing without difficulty. Psychiatric this patient is able to make decisions and demonstrates good insight into disease process. Alert and Oriented x 3. pleasant and cooperative. General Notes: Upon inspection patient's wound bed actually showed signs of good granulation and epithelization at this point. Fortunately I do not see any evidence of infection locally or systemically which is great news. With that being said there is some irritation around the edges of the wound I am afraid that the Zetuvit dressings may actually be causing some irritation here I would recommend switching to an ABD pad which I think will be softer and we will see how things go with that. I am also can recommend AandD ointment to the irritated and excoriated skin. Integumentary (Hair, Skin) Wound #1 status is Open. Original cause of wound was Blister. The date acquired was: 03/07/2023. The wound has been in treatment 1 weeks. The wound is located on the Left Gluteus. The wound measures 1.8cm length x  4.5cm width x 2.1cm depth; 6.362cm^2 area and 13.36cm^3 volume. There is Fat Layer (Subcutaneous Tissue) exposed. There is no tunneling or undermining noted. There is a medium amount of serosanguineous drainage noted. There is large (67- 100%) red granulation within the wound bed. There is a small (1-33%) amount of necrotic tissue within the wound bed including Adherent  Slough. Assessment Active Problems ICD-10 Pressure ulcer of left buttock, stage 3 Multiple sclerosis Plan 1. Based on the findings today we will get a go ahead and see about getting her started with AandD ointment around the excoriated skin I would recommend limiting the powder is much as possible. 2. I would recommend as well that the patient should Based on this continue with [facility] using a rope I think changing daily is probably the best way to go and we will see how things appear next week. 3. I am also going to suggest the patient should continue with appropriate offloading this is still to be of utmost importance. We will see patient back for reevaluation in 1 week here in the clinic. If anything worsens or changes patient will contact our office for additional recommendations. Electronic Signature(s) Signed: 03/28/2023 1:08:18 PM By: [name] PA-C Entered By: [name] on 03/28/2023 13:08:18 -------------------------------------------------------------------------------- SuperBill Details Patient Name: Date of Service: [name], [name] 03/28/2023 Medical Record Number: [id number] Patient Account Number: [id number] Date of Birth/Sex: Treating RN: 17-Nov-1970 (52 y.o. [name] Primary Care Provider: Bertram Denver Other Clinician: Referring Provider: Treating Provider/Extender: [name] [place], [place] [name] in Treatment: 1 Diagnosis Coding ICD-10 Codes Code Description [phone] Pressure ulcer of left buttock, stage 3 ALLAIRE, MCKIBBIN L ([id number]) [id number]  Page 6 of 6 G35 Multiple sclerosis Facility Procedures : CPT4 Code: [id number] Description: 99213 - WOUND CARE VISIT-LEV 3 EST PT Modifier: Quantity: 1 Physician Procedures : CPT4 Code Description Modifier [id number] - WC PHYS LEVEL 3 - EST PT ICD-10 Diagnosis Description L89.323 Pressure ulcer of left buttock, stage 3 G35 Multiple sclerosis Quantity: 1 Electronic Signature(s) Signed: 03/28/2023 1:30:12 PM By: Yevonne Pax RN Signed: 03/28/2023 5:13:07 PM By: [name] PA-C Previous Signature: 03/28/2023 1:08:27 PM Version By: [name] PA-C Entered By: Yevonne Pax on 03/28/2023 13:30:12

## 2023-04-04 ENCOUNTER — Encounter: Payer: 59 | Admitting: Physician Assistant

## 2023-04-04 DIAGNOSIS — L89323 Pressure ulcer of left buttock, stage 3: Secondary | ICD-10-CM | POA: Diagnosis not present

## 2023-04-04 NOTE — Progress Notes (Addendum)
[name], [name] ([id number]) [id number] Page 1 of 10 Visit Report for 04/04/2023 Arrival Information Details Patient Name: Date of Service: [name], [name] 04/04/2023 8:45 A M Medical Record Number: [id number] Patient Account Number: [id number] Date of Birth/Sex: Treating RN: 08-24-1971 (52 y.o. [name] Primary Care : [name], [name] Other Clinician: Referring : Treating /Extender: [name] [place], [place] [name] in Treatment: 2 Visit Information History Since Last Visit Added or deleted any medications: No Patient Arrived: Wheel Chair Any new allergies or adverse reactions: No Arrival Time: 08:45 Had a fall or experienced change in No Accompanied By: self activities of daily living that may affect Transfer Assistance: None risk of falls: Patient Identification Verified: Yes Signs or symptoms of abuse/neglect since last visito No Secondary Verification Process Completed: Yes Hospitalized since last visit: No Patient Requires Transmission-Based Precautions: No Implantable device outside of the clinic excluding No Patient Has Alerts: Yes cellular tissue based products placed in the center Patient Alerts: NOT DIABETIC since last visit: Has Dressing in Place as Prescribed: Yes Pain Present Now: No Electronic Signature(s) Signed: 04/04/2023 9:07:23 AM By: Yevonne Pax RN Entered By: Yevonne Pax on 04/04/2023 09:07:23 -------------------------------------------------------------------------------- Clinic Level of Care Assessment Details Patient Name: Date of Service: [name], [name] 04/04/2023 8:45 A M Medical Record Number: [id number] Patient Account Number: [id number] Date of Birth/Sex: Treating RN: 1970-11-03 (52 y.o. [name] Primary Care : Bertram Denver Other Clinician: Referring : Treating /Extender: [name] [place], [place] [name] in Treatment: 2 Clinic Level of  Care Assessment Items TOOL 4 Quantity Score X- 1 0 Use when only an EandM is performed on FOLLOW-UP visit ASSESSMENTS - Nursing Assessment / Reassessment X- 1 10 Reassessment of Co-morbidities (includes updates in patient status) X- 1 5 Reassessment of Adherence to Treatment Plan [name], [name] ([id number]) [id number] Page 2 of 10 ASSESSMENTS - Wound and Skin A ssessment / Reassessment X - Simple Wound Assessment / Reassessment - one wound 1 5 []  - 0 Complex Wound Assessment / Reassessment - multiple wounds []  - 0 Dermatologic / Skin Assessment (not related to wound area) ASSESSMENTS - Focused Assessment []  - 0 Circumferential Edema Measurements - multi extremities []  - 0 Nutritional Assessment / Counseling / Intervention []  - 0 Lower Extremity Assessment (monofilament, tuning fork, pulses) []  - 0 Peripheral Arterial Disease Assessment (using hand held doppler) ASSESSMENTS - Ostomy and/or Continence Assessment and Care []  - 0 Incontinence Assessment and Management []  - 0 Ostomy Care Assessment and Management (repouching, etc.) PROCESS - Coordination of Care X - Simple Patient / Family Education for ongoing care 1 15 []  - 0 Complex (extensive) Patient / Family Education for ongoing care []  - 0 Staff obtains Chiropractor, Records, T Results / Process Orders est []  - 0 Staff telephones HHA, Nursing Homes / Clarify orders / etc []  - 0 Routine Transfer to another Facility (non-emergent condition) []  - 0 Routine Hospital Admission (non-emergent condition) []  - 0 New Admissions / Manufacturing engineer / Ordering NPWT Apligraf, etc. , []  - 0 Emergency Hospital Admission (emergent condition) X- 1 10 Simple Discharge Coordination []  - 0 Complex (extensive) Discharge Coordination PROCESS - Special Needs []  - 0 Pediatric / Minor Patient Management []  - 0 Isolation Patient Management []  - 0 Hearing / Language / Visual special needs []  -  0 Assessment of Community assistance (transportation, D/C planning, etc.) []  - 0 Additional assistance / Altered mentation []  - 0 Support Surface(s)  Assessment (bed, cushion, seat, etc.) INTERVENTIONS - Wound Cleansing / Measurement X - Simple Wound Cleansing - one wound 1 5 []  - 0 Complex Wound Cleansing - multiple wounds X- 1 5 Wound Imaging (photographs - any number of wounds) []  - 0 Wound Tracing (instead of photographs) X- 1 5 Simple Wound Measurement - one wound []  - 0 Complex Wound Measurement - multiple wounds INTERVENTIONS - Wound Dressings X - Small Wound Dressing one or multiple wounds 1 10 []  - 0 Medium Wound Dressing one or multiple wounds []  - 0 Large Wound Dressing one or multiple wounds []  - 0 Application of Medications - topical []  - 0 Application of Medications - injection INTERVENTIONS - Miscellaneous []  - 0 External ear exam [name], [name] ([id number]) [id number] Page 3 of 10 []  - 0 Specimen Collection (cultures, biopsies, blood, body fluids, etc.) []  - 0 Specimen(s) / Culture(s) sent or taken to Lab for analysis []  - 0 Patient Transfer (multiple staff / Michiel Sites Lift / Similar devices) []  - 0 Simple Staple / Suture removal (25 or less) []  - 0 Complex Staple / Suture removal (26 or more) []  - 0 Hypo / Hyperglycemic Management (close monitor of Blood Glucose) []  - 0 Ankle / Brachial Index (ABI) - do not check if billed separately X- 1 5 Vital Signs Has the patient been seen at the hospital within the last three years: Yes Total Score: 75 Level Of Care: New/Established - Level 2 Electronic Signature(s) Signed: 04/06/2023 1:44:50 PM By: Yevonne Pax RN Entered By: Yevonne Pax on 04/04/2023 10:06:16 -------------------------------------------------------------------------------- Encounter Discharge Information Details Patient Name: Date of Service: [name], [name] 04/04/2023 8:45 A M Medical Record Number:  [id number] Patient Account Number: [id number] Date of Birth/Sex: Treating RN: [birth year] (52 y.o. [name] Primary Care : [name], [name] Other Clinician: Referring : Treating /Extender: [name] [place], [place] [name] in Treatment: 2 Encounter Discharge Information Items Discharge Condition: Stable Ambulatory Status: Wheelchair Discharge Destination: Home Transportation: Private Auto Accompanied By: self Schedule Follow-up Appointment: Yes Clinical Summary of Care: Electronic Signature(s) Signed: 04/04/2023 10:07:01 AM By: Yevonne Pax RN Entered By: Yevonne Pax on 04/04/2023 10:07:01 -------------------------------------------------------------------------------- Lower Extremity Assessment Details Patient Name: Date of Service: [name], FABIANA RYDEL 04/04/2023 8:45 A NISI, DEVEGA L ([id number]) [id number] Page 4 of 10 Medical Record Number: [id number] Patient Account Number: [id number] Date of Birth/Sex: Treating RN: [birth year] (52 y.o. [name] Primary Care : Bertram Denver Other Clinician: Referring : Treating /Extender: [name] [place], [place] [name] in Treatment: 2 Electronic Signature(s) Signed: 04/04/2023 9:10:31 AM By: Yevonne Pax RN Entered By: Yevonne Pax on 04/04/2023 09:10:31 -------------------------------------------------------------------------------- Multi Wound Chart Details Patient Name: Date of Service: Samantha [name] Pratt, Irja L. 04/04/2023 8:45 A M Medical Record Number: [id number] Patient Account Number: [id number] Date of Birth/Sex: Treating RN: 1970-12-30 (52 y.o. [name] Primary Care : [name], [name] Other Clinician: Referring : Treating /Extender: [name] [place], [place] [name] in Treatment: 2 Vital Signs Height(in): 64 Pulse(bpm): 91 Weight(lbs): 166 Blood Pressure(mmHg): 143/80 Body Mass Index(BMI):  28.5 Temperature(F): 98.2 Respiratory Rate(breaths/min): 18 [1:Photos:] [N/A:N/A] Left Gluteus N/A N/A Wound Location: Blister N/A N/A Wounding Event: Pressure Ulcer N/A N/A Primary Etiology: Neuropathy N/A N/A Comorbid History: 03/07/2023 N/A N/A Date Acquired: 2 N/A N/A Weeks of Treatment: Open N/A N/A Wound Status: No N/A N/A Wound Recurrence: 1.7x4.3x1.7 N/A N/A Measurements L x W x D (cm) 5.741 N/A  N/A A (cm) : rea 9.76 N/A N/A Volume (cm) : -306.00% N/A N/A % Reduction in A rea: -228.70% N/A N/A % Reduction in Volume: 3 Position 1 (o'clock): 2 Maximum Distance 1 (cm): Yes N/A N/A Tunneling: Category/Stage III N/A N/A Classification: Medium N/A N/A Exudate A mount: Serosanguineous N/A N/A Exudate Type: red, brown N/A N/A Exudate Color: Large (67-100%) N/A N/A Granulation A mount: Red N/A N/A Granulation Quality: Small (1-33%) N/A N/A Necrotic A mount: Fat Layer (Subcutaneous Tissue): Yes N/A N/A Exposed Structures: Fascia: No Tendon: No SIDNEE, LOOSE ([id number]) [id number] Page 5 of 10 Muscle: No Joint: No Bone: No None N/A N/A Epithelialization: Treatment Notes Electronic Signature(s) Signed: 04/04/2023 9:10:35 AM By: Yevonne Pax RN Entered By: Yevonne Pax on 04/04/2023 09:10:35 -------------------------------------------------------------------------------- Multi-Disciplinary Care Plan Details Patient Name: Date of Service: Samantha [name] Pratt, Malaka L. 04/04/2023 8:45 A M Medical Record Number: [id number] Patient Account Number: [id number] Date of Birth/Sex: Treating RN: [birth year] (52 y.o. [name] Primary Care : [name], [name] Other Clinician: Referring : Treating /Extender: [name] [place], [place] [name] in Treatment: 2 Active Inactive Peripheral Neuropathy Nursing Diagnoses: Knowledge deficit related to disease process and management of peripheral neurovascular  dysfunction Potential alteration in peripheral tissue perfusion (select prior to confirmation of diagnosis) Goals: Patient/caregiver will verbalize understanding of disease process and disease management Date Initiated: 03/21/2023 Target Resolution Date: 04/20/2023 Goal Status: Active Interventions: Assess signs and symptoms of neuropathy upon admission and as needed Provide education on Management of Neuropathy and Related Ulcers Provide education on Management of Neuropathy upon discharge from the Wound Center Notes: Pressure Nursing Diagnoses: Knowledge deficit related to causes and risk factors for pressure ulcer development Knowledge deficit related to management of pressures ulcers Potential for impaired tissue integrity related to pressure, friction, moisture, and shear Goals: Patient will remain free from development of additional pressure ulcers Date Initiated: 03/21/2023 Target Resolution Date: 04/20/2023 Goal Status: Active Patient/caregiver will verbalize understanding of pressure ulcer management Date Initiated: 03/21/2023 Target Resolution Date: 04/20/2023 Goal Status: Active Interventions: Assess: immobility, friction, shearing, incontinence upon admission and as needed Assess offloading mechanisms upon admission and as needed Assess potential for pressure ulcer upon admission and as needed REA, ARIZOLA ([id number]) [id number] Page 6 of 10 Provide education on pressure ulcers Treatment Activities: Patient referred for home evaluation of offloading devices/mattresses : 03/21/2023 Notes: Soft Tissue Infection Nursing Diagnoses: Impaired tissue integrity Knowledge deficit related to disease process and management Knowledge deficit related to home infection control: handwashing, handling of soiled dressings, supply storage Potential for infection: soft tissue Goals: Patient will remain free of wound infection Date Initiated:  03/21/2023 Target Resolution Date: 05/21/2023 Goal Status: Active Patient/caregiver will verbalize understanding of or measures to prevent infection and contamination in the home setting Date Initiated: 03/21/2023 Date Inactivated: 03/28/2023 Target Resolution Date: 03/21/2023 Goal Status: Met Patient's soft tissue infection will resolve Date Initiated: 03/21/2023 Target Resolution Date: 03/31/2023 Goal Status: Active Signs and symptoms of infection will be recognized early to allow for prompt treatment Date Initiated: 03/21/2023 Date Inactivated: 03/28/2023 Target Resolution Date: 03/21/2023 Goal Status: Met Interventions: Assess signs and symptoms of infection every visit Provide education on infection Treatment Activities: Systemic antibiotics : 03/21/2023 Notes: Wound/Skin Impairment Nursing Diagnoses: Impaired tissue integrity Knowledge deficit related to smoking impact on wound healing Knowledge deficit related to ulceration/compromised skin integrity Goals: Patient/caregiver will verbalize understanding of skin care regimen Date Initiated: 03/21/2023 Target Resolution Date: 05/21/2023 Goal Status: Active Ulcer/skin breakdown will have  a volume reduction of 30% by week 4 Date Initiated: 03/21/2023 Target Resolution Date: 04/18/2023 Goal Status: Active Ulcer/skin breakdown will have a volume reduction of 50% by week 8 Date Initiated: 03/21/2023 Target Resolution Date: 05/16/2023 Goal Status: Active Ulcer/skin breakdown will have a volume reduction of 80% by week 12 Date Initiated: 03/21/2023 Target Resolution Date: 06/13/2023 Goal Status: Active Ulcer/skin breakdown will heal within 14 weeks Date Initiated: 03/21/2023 Target Resolution Date: 06/27/2023 Goal Status: Active Interventions: Assess patient/caregiver ability to obtain necessary supplies Assess patient/caregiver ability to perform ulcer/skin care regimen upon admission and as needed Assess ulceration(s) every  visit Treatment Activities: Patient referred to home care : 03/21/2023 Skin care regimen initiated : 03/21/2023 Notes: Electronic Signature(s) Signed: 04/04/2023 9:11:05 AM By: Yevonne Pax RN Entered By: Yevonne Pax on 04/04/2023 09:11:05 [name] ([id number]) [id number] Page 7 of 10 -------------------------------------------------------------------------------- Pain Assessment Details Patient Name: Date of Service: KARNEISHA, KACZANOWSKI 04/04/2023 8:45 A M Medical Record Number: [id number] Patient Account Number: [id number] Date of Birth/Sex: Treating RN: 1970/11/03 (52 y.o. [name] Primary Care : [name], [name] Other Clinician: Referring : Treating /Extender: [name] [place], [place] [name] in Treatment: 2 Active Problems Location of Pain Severity and Description of Pain Patient Has Paino No Site Locations Pain Management and Medication Current Pain Management: Electronic Signature(s) Signed: 04/04/2023 9:08:48 AM By: Yevonne Pax RN Entered By: Yevonne Pax on 04/04/2023 09:08:47 -------------------------------------------------------------------------------- Patient/Caregiver Education Details Patient Name: Date of Service: Samantha Lucius Conn 7/9/2024andnbsp8:45 A M Medical Record Number: [id number] Patient Account Number: [id number] Date of Birth/Gender: Treating RN: 03-24-1971 (52 y.o. [name] Primary Care Physician: Bertram Denver Other Clinician: Referring Physician: Treating Physician/Extender: [name] GO [place], [place] Ladon Applebaum, [place] ([id number]) [id number].pdf Page 8 of 10 Weeks in Treatment: 2 Education Assessment Education Provided To: Patient Education Topics Provided Wound/Skin Impairment: Handouts: Caring for Your Ulcer Methods: Explain/Verbal Responses: State content correctly Electronic Signature(s) Signed: 04/06/2023 1:44:50 PM By: Yevonne Pax RN Entered By: Yevonne Pax on 04/04/2023 09:11:28 -------------------------------------------------------------------------------- Wound Assessment Details Patient Name: Date of Service: Samantha SAKINA, ORFANOS 04/04/2023 8:45 A M Medical Record Number: [id number] Patient Account Number: [id number] Date of Birth/Sex: Treating RN: [birth year] (52 y.o. [name] Primary Care : [name], [name] Other Clinician: Referring : Treating /Extender: [name] [place], [place] [name] in Treatment: 2 Wound Status Wound Number: 1 Primary Etiology: Pressure Ulcer Wound Location: Left Gluteus Wound Status: Open Wounding Event: Blister Comorbid History: Neuropathy Date Acquired: 03/07/2023 Weeks Of Treatment: 2 Clustered Wound: No Photos Wound Measurements Length: (cm) 1.7 Width: (cm) 4.3 Depth: (cm) 1.7 Area: (cm) 5.74 Volume: (cm) 9.76 [place], Jaidyn L ([id number]) % Reduction in Area: -306% % Reduction in Volume: -228.7% Epithelialization: None 1 Tunneling: No Undermining: Yes Starting Position (o'clock): 12 Ending Position (o'clock): 3 Maximum Distance: (cm) 2 [id number] Page 9 of 10 Wound Description Classification: Category/Stage III Exudate Amount: Medium Exudate Type: Serosanguineous Exudate Color: red, brown Foul Odor After Cleansing: No Slough/Fibrino Yes Wound Bed Granulation Amount: Large (67-100%) Exposed Structure Granulation Quality: Red Fascia Exposed: No Necrotic Amount: Small (1-33%) Fat Layer (Subcutaneous Tissue) Exposed: Yes Necrotic Quality: Adherent Slough Tendon Exposed: No Muscle Exposed: No Joint Exposed: No Bone Exposed: No Treatment Notes Wound #1 (Gluteus) Wound Laterality: Left Cleanser Soap and Water Discharge Instruction: Gently cleanse wound with antibacterial soap, rinse and pat dry prior to dressing wounds Peri-Wound Care AandD Ointment Discharge Instruction: posterior  upper thigh , thin layer Topical Primary Dressing Hydrofera Blue Ready Transfer Foam, 2.5x2.5 (in/in) Discharge Instruction: 1/2 hydrofera blue rope inside wound, then top with foam hydrofera blue Secondary Dressing ABD Pad 5x9 (in/in) Discharge Instruction: Cover with ABD pad Secured With Medipore T - 59M Medipore H Soft Cloth Surgical T ape ape, 2x2 (in/yd) Compression Wrap Compression Stockings Add-Ons Electronic Signature(s) Signed: 04/18/2023 9:16:40 AM By: Yevonne Pax RN Previous Signature: 04/04/2023 9:10:13 AM Version By: Yevonne Pax RN Entered By: Yevonne Pax on 04/18/2023 09:16:40 -------------------------------------------------------------------------------- Vitals Details Patient Name: Date of Service: [name], Dierdre L. 04/04/2023 8:45 A M Medical Record Number: [id number] Patient Account Number: [id number] Date of Birth/Sex: Treating RN: May 03, 1971 (52 y.o. [name] Primary Care : Bertram Denver Other Clinician: Referring : Treating /Extender: [name] [place], [place] [name] in Treatment: 2 Vital Signs KEIANDRA, HULTON ([id number]) [id number] Page 10 of 10 Time Taken: 08:50 Temperature (F): 98.2 Height (in): 64 Pulse (bpm): 91 Weight (lbs): 166 Respiratory Rate (breaths/min): 18 Body Mass Index (BMI): 28.5 Blood Pressure (mmHg): 143/80 Reference Range: 80 - 120 mg / dl Electronic Signature(s) Signed: 04/04/2023 9:08:30 AM By: Yevonne Pax RN Entered By: Yevonne Pax on 04/04/2023 09:08:30

## 2023-04-04 NOTE — Progress Notes (Signed)
EVADEAN, SPROULE ([id number]) [id number] Page 1 of 6 Visit Report for 04/04/2023 Chief Complaint Document Details Patient Name: Date of Service: [name], [name] 04/04/2023 8:45 A M Medical Record Number: [id number] Patient Account Number: [id number] Date of Birth/Sex: Treating RN: 1971/06/13 (52 y.o. [name] Primary Care Provider: [name], [name] Other Clinician: Referring Provider: Treating Provider/Extender: [name] GO [place], [place] [name] in Treatment: 2 Information Obtained from: Patient Chief Complaint Pressure ulcer left gluteal region Electronic Signature(s) Signed: 04/04/2023 8:54:31 AM By: [name] PA-C Entered By: [name] on 04/04/2023 08:54:31 -------------------------------------------------------------------------------- HPI Details Patient Name: Date of Service: [name], [name] 04/04/2023 8:45 A M Medical Record Number: [id number] Patient Account Number: [id number] Date of Birth/Sex: Treating RN: 31-Jul-1971 (52 y.o. [name] Primary Care Provider: [name], [name] Other Clinician: Referring Provider: Treating Provider/Extender: [name] [place], [place] [name] in Treatment: 2 History of Present Illness HPI Description: 03-21-2023 upon evaluation today patient appears for initial evaluation regarding a wound that actually began around 03/07/2023. With that being said she has been having issues here with this area and then subsequently ended up in the hospital due to a high fever. She was discharged from the hospital and then started on oral antibiotics that was on 03-16-2023. The oral antibiotics are doxycycline and Keflex that she is currently taking. With that being said she was diagnosed with multiple sclerosis in 2000 [name] she has been in a well motorized wheelchair since 2014. She is a pleasant individual and unfortunately this is the issue that she is having currently with a pressure ulcer although this  is the good news is it is the first time she has had this happen she has an excellent caregiver that takes great care of her and the family is very attentive. 03-28-2023 upon evaluation today patient appears to be doing worse with regard to the irritation around the edges of her wound. The good news is the wound itself does not appear worse but the irritation is definitely not as good as even what I saw last week. In the interim since last week [name] her aide has actually broken her wrist and is no longer helping with the dressing changes I think this is part of the issue here. Fortunately I do not see any evidence of infection locally or systemically which is good news. 04-04-2023 upon evaluation today patient's wounds are actually showing signs of significant improvement which is great news. Fortunately I do not see any evidence of worsening overall I think that her family is making excellent progress towards getting this closed she is doing a good job offloading we will try to get the air-fluidized bed but that is just not working out her insurance at this point is denying this. She does have an alternating air mattress at home although it has been apparently on static mode I did advise this to be switched to alternating mode. [name], [name] ([id number]) [id number] Page 2 of 6 Electronic Signature(s) Signed: 04/04/2023 6:46:24 PM By: [name] PA-C Entered By: [name] on 04/04/2023 18:46:24 -------------------------------------------------------------------------------- Physical Exam Details Patient Name: Date of Service: [name], [name]. 04/04/2023 8:45 A M Medical Record Number: [id number] Patient Account Number: [id number] Date of Birth/Sex: Treating RN: Jul 06, 1971 (52 y.o. [name] Primary Care Provider: [name], [name] Other Clinician: Referring Provider: Treating Provider/Extender: [name] [place], [place] [name] in Treatment:  2 Constitutional Obese and well-hydrated in no  acute distress. Respiratory normal breathing without difficulty. Psychiatric this patient is able to make decisions and demonstrates good insight into disease process. Alert and Oriented x 3. pleasant and cooperative. Notes Upon inspection patient's wound bed actually showed signs again of good granulation epithelization and a short debridement was necessary today and in general I really think that she is making good headway towards complete closure and very pleased in that regard. Electronic Signature(s) Signed: 04/04/2023 6:46:44 PM By: [name] PA-C Entered By: [name] on 04/04/2023 18:46:44 -------------------------------------------------------------------------------- Physician Orders Details Patient Name: Date of Service: [name], [name] 04/04/2023 8:45 A M Medical Record Number: [id number] Patient Account Number: [id number] Date of Birth/Sex: Treating RN: Jan 18, 1971 (52 y.o. 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 Home Health Home Health Company: - [facility] fax: [phone] [facility] for wound care. May utilize formulary equivalent dressing for wound treatment orders unless otherwise specified. Home Health Nurse may visit PRN to address patients wound care needs. Scheduled days for dressing changes to be completed; exception, patient has scheduled wound care visit that day. - Monday, Wednesday, Friday **Please direct any NON-WOUND related issues/requests for orders to patient's Primary Care  Physician. **If current dressing causes regression in wound condition, may D/C ordered dressing product/s and apply Normal Saline Moist Dressing daily until next Wound Healing Center or Other MD appointment. **Notify Wound Healing Center of regression in wound condition at [phone]. Bathing/ [employer] wounds with antibacterial soap and water. Anesthetic (Use 'Patient Medications' Section for Anesthetic Order Entry) Lidocaine applied to wound bed Off-Loading Roho cushion for wheelchair Hospital bed/mattress A fluidized (Group 3) - Home Health, please order for patient. ir Turn and reposition every 2 hours - Float heels on pillow when in the bed. Additional Orders / Instructions Follow Nutritious Diet and Increase Protein Intake Wound Treatment Wound #1 - Gluteus Wound Laterality: Left Cleanser: Soap and Water (Home Health) 1 x Per Day/30 Days Discharge Instructions: Gently cleanse wound with antibacterial soap, rinse and pat dry prior to dressing wounds Peri-Wound Care: AandD Ointment 1 x Per Day/30 Days Discharge Instructions: posterior upper thigh , thin layer Prim Dressing: Hydrofera Blue Ready Transfer Foam, 2.5x2.5 (in/in) (Home Health) 1 x Per Day/30 Days ary Discharge Instructions: 1/2 hydrofera blue rope inside wound, then top with foam hydrofera blue Secondary Dressing: ABD Pad 5x9 (in/in) 1 x Per Day/30 Days Discharge Instructions: Cover with ABD pad Secured With: Medipore T - 35M Medipore H Soft Cloth Surgical T ape ape, 2x2 (in/yd) 1 x Per Day/30 Days Electronic Signature(s) Signed: 04/04/2023 10:05:40 AM By: Yevonne Pax RN Signed: 04/04/2023 7:04:22 PM By: [name] PA-C Entered By: Yevonne Pax on 04/04/2023 10:05:40 -------------------------------------------------------------------------------- Problem List Details Patient Name: Date of Service: [name], [name] 04/04/2023 8:45 A M Medical Record Number: [id number] Patient Account Number:  [id number] Date of Birth/Sex: Treating RN: [birth year] (52 y.o. [name] Primary Care Provider: Bertram Denver Other Clinician: Referring Provider: Treating Provider/Extender: [name] [place], [place] [name] in Treatment: 2 Active Problems YARDEN, MANUELITO ([id number]) [id number] Page 4 of 6 ICD-10 Encounter Code Description Active Date MDM Diagnosis L89.323 Pressure ulcer  of left buttock, stage 3 03/21/2023 No Yes G35 Multiple sclerosis 03/21/2023 No Yes Inactive Problems Resolved Problems Electronic Signature(s) Signed: 04/04/2023 8:54:28 AM By: [name] PA-C Entered By: [name] on 04/04/2023 08:54:28 -------------------------------------------------------------------------------- Progress Note Details Patient Name: Date of Service: [name], [name] 04/04/2023 8:45 A M Medical Record Number: [id number] Patient Account Number: [id number] Date of Birth/Sex: Treating RN: 06-07-1971 (52 y.o. [name] Primary Care Provider: [name], [name] Other Clinician: Referring Provider: Treating Provider/Extender: [name] [place], [place] [name] in Treatment: 2 Subjective Chief Complaint Information obtained from Patient Pressure ulcer left gluteal region History of Present Illness (HPI) 03-21-2023 upon evaluation today patient appears for initial evaluation regarding a wound that actually began around 03/07/2023. With that being said she has been having issues here with this area and then subsequently ended up in the hospital due to a high fever. She was discharged from the hospital and then started on oral antibiotics that was on 03-16-2023. The oral antibiotics are doxycycline and Keflex that she is currently taking. With that being said she was diagnosed with multiple sclerosis in 2000 [name] she has been in a well motorized wheelchair since 2014. She is a pleasant individual and unfortunately this is the issue that she is having  currently with a pressure ulcer although this is the good news is it is the first time she has had this happen she has an excellent caregiver that takes great care of her and the family is very attentive. 03-28-2023 upon evaluation today patient appears to be doing worse with regard to the irritation around the edges of her wound. The good news is the wound itself does not appear worse but the irritation is definitely not as good as even what I saw last week. In the interim since last week [name] her aide has actually broken her wrist and is no longer helping with the dressing changes I think this is part of the issue here. Fortunately I do not see any evidence of infection locally or systemically which is good news. 04-04-2023 upon evaluation today patient's wounds are actually showing signs of significant improvement which is great news. Fortunately I do not see any evidence of worsening overall I think that her family is making excellent progress towards getting this closed she is doing a good job offloading we will try to get the air-fluidized bed but that is just not working out her insurance at this point is denying this. She does have an alternating air mattress at home although it has been apparently on static mode I did advise this to be switched to alternating mode. Objective Constitutional AMALA, PETION ([id number]) [id number] Page 5 of 6 Obese and well-hydrated in no acute distress. Vitals Time Taken: 8:50 AM, Height: 64 in, Weight: 166 lbs, BMI: 28.5, Temperature: 98.2 F, Pulse: 91 bpm, Respiratory Rate: 18 breaths/min, Blood Pressure: 143/80 mmHg. Respiratory normal breathing without difficulty. Psychiatric this patient is able to make decisions and demonstrates good insight into disease process. Alert and Oriented x 3. pleasant and cooperative. General Notes: Upon inspection patient's wound bed actually showed signs again of good granulation  epithelization and a short debridement was necessary today and in general I really think that she is making good headway towards complete closure and very pleased in that regard. Integumentary (Hair, Skin) Wound #1 status is Open. Original cause of wound was Blister. The date acquired was: 03/07/2023. The wound has been in treatment 2 weeks. The wound is  located on the Left Gluteus. The wound measures 1.7cm length x 4.3cm width x 1.7cm depth; 5.741cm^2 area and 9.76cm^3 volume. There is Fat Layer (Subcutaneous Tissue) exposed. There is no undermining noted, however, there is tunneling at 3:00 with a maximum distance of 2cm. There is a medium amount of serosanguineous drainage noted. There is large (67-100%) red granulation within the wound bed. There is a small (1-33%) amount of necrotic tissue within the wound bed including Adherent Slough. Assessment Active Problems ICD-10 Pressure ulcer of left buttock, stage 3 Multiple sclerosis Plan Follow-up Appointments: Return Appointment in 1 week. Home Health: Home Health Company: - [facility] fax: [phone] [facility] for wound care. May utilize formulary equivalent dressing for wound treatment orders unless otherwise specified. Home Health Nurse may visit PRN to address patients wound care needs. Scheduled days for dressing changes to be completed; exception, patient has scheduled wound care visit that day. - Monday, Wednesday, Friday **Please direct any NON-WOUND related issues/requests for orders to patient's Primary Care Physician. **If current dressing causes regression in wound condition, may D/C ordered dressing product/s and apply Normal Saline Moist Dressing daily until next Wound Healing Center or Other MD appointment. **Notify Wound Healing Center of regression in wound condition at [phone]. Bathing/ Shower/ Hygiene: Wash wounds with antibacterial soap and water. Anesthetic (Use 'Patient Medications' Section for  Anesthetic Order Entry): Lidocaine applied to wound bed Off-Loading: Roho cushion for wheelchair Hospital bed/mattress Air fluidized (Group 3) - Home Health, please order for patient. Turn and reposition every 2 hours - Float heels on pillow when in the bed. Additional Orders / Instructions: Follow Nutritious Diet and Increase Protein Intake WOUND #1: - Gluteus Wound Laterality: Left Cleanser: Soap and Water (Home Health) 1 x Per Day/30 Days Discharge Instructions: Gently cleanse wound with antibacterial soap, rinse and pat dry prior to dressing wounds Peri-Wound Care: AandD Ointment 1 x Per Day/30 Days Discharge Instructions: posterior upper thigh , thin layer Prim Dressing: Hydrofera Blue Ready Transfer Foam, 2.5x2.5 (in/in) (Home Health) 1 x Per Day/30 Days ary Discharge Instructions: 1/2 hydrofera blue rope inside wound, then top with foam hydrofera blue Secondary Dressing: ABD Pad 5x9 (in/in) 1 x Per Day/30 Days Discharge Instructions: Cover with ABD pad Secured With: Medipore T - 1M Medipore H Soft Cloth Surgical T ape ape, 2x2 (in/yd) 1 x Per Day/30 Days 1. I am going to suggest that we have the patient continue for now with the [facility] she is in agreement with that plan. Will continue to monitor and see how things proceed over the next week or so. 2. Also can recommend that the patient should continue to utilize appropriate and aggressive offloading I discussed this with the patient and family. 3. I am also can recommend that we continue with ABD pad to cover secured with tape. We will see patient back for reevaluation in 1 week here in the clinic. If anything worsens or changes patient will contact our office for additional recommendations. Electronic Signature(s) Signed: 04/04/2023 6:47:13 PM By: [name] PA-C Entered By: [name] on 04/04/2023 18:47:13 Rae, Drucie Opitz ([id number]) [id number] Page 6 of  6 -------------------------------------------------------------------------------- SuperBill Details Patient Name: Date of Service: [name], [name] 04/04/2023 Medical Record Number: [id number] Patient Account Number: [id number] Date of Birth/Sex: Treating RN: 10/24/1970 (52 y.o. 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

## 2023-04-06 MED ORDER — LACTULOSE 20 GRAM/30 ML ORAL SOLUTION
Freq: Every day | ORAL | 3 refills | 6 days | Status: CP
Start: 2023-04-06 — End: ?

## 2023-04-10 MED ORDER — LACTULOSE 20 GRAM/30 ML ORAL SOLUTION
Freq: Every day | ORAL | 3 refills | 6 days | Status: CP
Start: 2023-04-10 — End: ?

## 2023-04-11 ENCOUNTER — Encounter: Payer: 59 | Admitting: Physician Assistant

## 2023-04-11 DIAGNOSIS — L89323 Pressure ulcer of left buttock, stage 3: Secondary | ICD-10-CM | POA: Diagnosis not present

## 2023-04-11 NOTE — Progress Notes (Addendum)
[name] ([id number]) [id number] Page 1 of 9 Visit Report for 04/11/2023 Arrival Information Details Patient Name: Date of Service: Samantha, Pratt 04/11/2023 12:30 PM Medical Record Number: [id number] Patient Account Number: [id number] Date of Birth/Sex: Treating RN: [birth year] (52 y.o. [name] Primary Care : Bertram Denver Other Clinician: Referring : Treating /Extender: [name] [place] [name] in Treatment: 3 Visit Information History Since Last Visit Added or deleted any medications: No Patient Arrived: Wheel Chair Any new allergies or adverse reactions: No Arrival Time: 12:33 Had a fall or experienced change in No Accompanied By: caregiver activities of daily living that may affect Transfer Assistance: None risk of falls: Patient Identification Verified: Yes Signs or symptoms of abuse/neglect since last visito No Secondary Verification Process Completed: Yes Hospitalized since last visit: No Patient Requires Transmission-Based Precautions: No Implantable device outside of the clinic excluding No Patient Has Alerts: Yes cellular tissue based products placed in the center Patient Alerts: NOT DIABETIC since last visit: Has Dressing in Place as Prescribed: Yes Pain Present Now: No Electronic Signature(s) Signed: 04/11/2023 12:49:27 PM By: Yevonne Pax RN Entered By: Yevonne Pax on 04/11/2023 12:49:27 -------------------------------------------------------------------------------- Clinic Level of Care Assessment Details Patient Name: Date of Service: [name] 04/11/2023 12:30 PM Medical Record Number: [id number] Patient Account Number: [id number] Date of Birth/Sex: Treating RN: December 02, 1970 (52 y.o. [name] Primary Care : Bertram Denver Other Clinician: Referring : Treating /Extender: [name] [place], [place] [name] in Treatment: 3 Clinic  Level of Care Assessment Items TOOL 1 Quantity Score []  - 0 Use when EandM and Procedure is performed on INITIAL visit ASSESSMENTS - Nursing Assessment / Reassessment []  - 0 General Physical Exam (combine w/ comprehensive assessment (listed just below) when performed on new pt. evals) []  - 0 Comprehensive Assessment (HX, ROS, Risk Assessments, Wounds Hx, etc.) [name] ([id number]) [phone].pdf Page 2 of 9 ASSESSMENTS - Wound and Skin Assessment / Reassessment []  - 0 Dermatologic / Skin Assessment (not related to wound area) ASSESSMENTS - Ostomy and/or Continence Assessment and Care []  - 0 Incontinence Assessment and Management []  - 0 Ostomy Care Assessment and Management (repouching, etc.) PROCESS - Coordination of Care []  - 0 Simple Patient / Family Education for ongoing care []  - 0 Complex (extensive) Patient / Family Education for ongoing care []  - 0 Staff obtains Chiropractor, Records, T Results / Process Orders est []  - 0 Staff telephones HHA, Nursing Homes / Clarify orders / etc []  - 0 Routine Transfer to another Facility (non-emergent condition) []  - 0 Routine Hospital Admission (non-emergent condition) []  - 0 New Admissions / Manufacturing engineer / Ordering NPWT Apligraf, etc. , []  - 0 Emergency Hospital Admission (emergent condition) PROCESS - Special Needs []  - 0 Pediatric / Minor Patient Management []  - 0 Isolation Patient Management []  - 0 Hearing / Language / Visual special needs []  - 0 Assessment of Community assistance (transportation, D/C planning, etc.) []  - 0 Additional assistance / Altered mentation []  - 0 Support Surface(s) Assessment (bed, cushion, seat, etc.) INTERVENTIONS - Miscellaneous []  - 0 External ear exam []  - 0 Patient Transfer (multiple staff / Nurse, adult / Similar devices) []  - 0 Simple Staple / Suture removal (25 or less) []  - 0 Complex Staple / Suture removal (26 or more) []  -  0 Hypo/Hyperglycemic Management (do not check if billed separately) []  - 0 Ankle / Brachial Index (ABI) - do not check  if billed separately Has the patient been seen at the hospital within the last three years: Yes Total Score: 0 Level Of Care: ____ Electronic Signature(s) Signed: 04/13/2023 3:42:23 PM By: Yevonne Pax RN Entered By: Yevonne Pax on 04/11/2023 13:07:51 -------------------------------------------------------------------------------- Encounter Discharge Information Details Patient Name: Date of Service: [name], [name]. 04/11/2023 12:30 PM Medical Record Number: [id number] Patient Account Number: [id number] Date of Birth/Sex: Treating RN: 08-10-1971 (52 y.o. [name] Primary Care : Bertram Denver Other Clinician: Referring : Treating /Extender: [name] [place] [name] in Treatment: [address] [name], [name] ([id number]) [id number] Page 3 of 9 Encounter Discharge Information Items Post Procedure Vitals Discharge Condition: Stable Temperature (F): 98.2 Ambulatory Status: Wheelchair Pulse (bpm): 83 Discharge Destination: Home Respiratory Rate (breaths/min): 18 Transportation: Private Auto Blood Pressure (mmHg): 124/82 Accompanied By: self Schedule Follow-up Appointment: Yes Clinical Summary of Care: Patient Declined Electronic Signature(s) Signed: 04/13/2023 3:42:23 PM By: Yevonne Pax RN Entered By: Yevonne Pax on 04/11/2023 13:08:45 -------------------------------------------------------------------------------- Lower Extremity Assessment Details Patient Name: Date of Service: [name], [name] 04/11/2023 12:30 PM Medical Record Number: [id number] Patient Account Number: [id number] Date of Birth/Sex: Treating RN: 01-Nov-1970 (52 y.o. [name] Primary Care : [name], [name] Other Clinician: Referring : Treating /Extender: [name] [place], [place] [name]  in Treatment: 3 Electronic Signature(s) Signed: 04/11/2023 12:51:23 PM By: Yevonne Pax RN Entered By: Yevonne Pax on 04/11/2023 12:51:23 -------------------------------------------------------------------------------- Multi Wound Chart Details Patient Name: Date of Service: [name], [name]. 04/11/2023 12:30 PM Medical Record Number: [id number] Patient Account Number: [id number] Date of Birth/Sex: Treating RN: 12/30/1970 (52 y.o. [name] Primary Care : [name], [name] Other Clinician: Referring : Treating /Extender: [name] [place], [place] [name] in Treatment: 3 Vital Signs Height(in): 64 Pulse(bpm): 83 Weight(lbs): 166 Blood Pressure(mmHg): 124/82 Body Mass Index(BMI): 28.5 Temperature(F): 98.2 Respiratory Rate(breaths/min): 18 [1:Photos:] [name], [name] ([id number]) [1:Photos:] [N/A:N/A] Left Gluteus N/A N/A Wound Location: Blister N/A N/A Wounding Event: Pressure Ulcer N/A N/A Primary Etiology: Neuropathy N/A N/A Comorbid History: 03/07/2023 N/A N/A Date Acquired: 3 N/A N/A Weeks of Treatment: Open N/A N/A Wound Status: No N/A N/A Wound Recurrence: 1.5x4x1 N/A N/A Measurements Pratt x W x D (cm) 4.712 N/A N/A A (cm) : rea 4.712 N/A N/A Volume (cm) : -233.20% N/A N/A % Reduction in A rea: -58.70% N/A N/A % Reduction in Volume: Category/Stage III N/A N/A Classification: Medium N/A N/A Exudate A mount: Serosanguineous N/A N/A Exudate Type: red, brown N/A N/A Exudate Color: Large (67-100%) N/A N/A Granulation A mount: Red N/A N/A Granulation Quality: Small (1-33%) N/A N/A Necrotic A mount: Fat Layer (Subcutaneous Tissue): Yes N/A N/A Exposed Structures: Fascia: No Tendon: No Muscle: No Joint: No Bone: No None N/A N/A Epithelialization: Treatment Notes Electronic Signature(s) Signed: 04/11/2023 12:51:27 PM By: Yevonne Pax RN Entered By: Yevonne Pax on 04/11/2023  12:51:27 -------------------------------------------------------------------------------- Multi-Disciplinary Care Plan Details Patient Name: Date of Service: [name], Ailani Pratt. 04/11/2023 12:30 PM Medical Record Number: [id number] Patient Account Number: [id number] Date of Birth/Sex: Treating RN: July 31, 1971 (52 y.o. [name] Primary Care : Bertram Denver Other Clinician: Referring : Treating /Extender: [name] [place], [place] [name] in Treatment: 3 Active Inactive Peripheral Neuropathy Nursing Diagnoses: Knowledge deficit related to disease process and management of peripheral neurovascular dysfunction Potential alteration in peripheral tissue perfusion (select prior to confirmation of diagnosis) Goals: Patient/caregiver will verbalize understanding of disease  process and disease management [name], [name] ([id number]) [id number] Page 5 of 9 Date Initiated: 03/21/2023 Target Resolution Date: 04/20/2023 Goal Status: Active Interventions: Assess signs and symptoms of neuropathy upon admission and as needed Provide education on Management of Neuropathy and Related Ulcers Provide education on Management of Neuropathy upon discharge from the Wound Center Notes: Pressure Nursing Diagnoses: Knowledge deficit related to causes and risk factors for pressure ulcer development Knowledge deficit related to management of pressures ulcers Potential for impaired tissue integrity related to pressure, friction, moisture, and shear Goals: Patient will remain free from development of additional pressure ulcers Date Initiated: 03/21/2023 Target Resolution Date: 04/20/2023 Goal Status: Active Patient/caregiver will verbalize understanding of pressure ulcer management Date Initiated: 03/21/2023 Target Resolution Date: 04/20/2023 Goal Status: Active Interventions: Assess: immobility, friction, shearing, incontinence upon admission and  as needed Assess offloading mechanisms upon admission and as needed Assess potential for pressure ulcer upon admission and as needed Provide education on pressure ulcers Treatment Activities: Patient referred for home evaluation of offloading devices/mattresses : 03/21/2023 Notes: Soft Tissue Infection Nursing Diagnoses: Impaired tissue integrity Knowledge deficit related to disease process and management Knowledge deficit related to home infection control: handwashing, handling of soiled dressings, supply storage Potential for infection: soft tissue Goals: Patient will remain free of wound infection Date Initiated: 03/21/2023 Target Resolution Date: 05/21/2023 Goal Status: Active Patient/caregiver will verbalize understanding of or measures to prevent infection and contamination in the home setting Date Initiated: 03/21/2023 Date Inactivated: 03/28/2023 Target Resolution Date: 03/21/2023 Goal Status: Met Patient's soft tissue infection will resolve Date Initiated: 03/21/2023 Date Inactivated: 04/11/2023 Target Resolution Date: 03/31/2023 Goal Status: Met Signs and symptoms of infection will be recognized early to allow for prompt treatment Date Initiated: 03/21/2023 Date Inactivated: 03/28/2023 Target Resolution Date: 03/21/2023 Goal Status: Met Interventions: Assess signs and symptoms of infection every visit Provide education on infection Treatment Activities: Systemic antibiotics : 03/21/2023 Notes: Wound/Skin Impairment Nursing Diagnoses: Impaired tissue integrity Knowledge deficit related to smoking impact on wound healing Knowledge deficit related to ulceration/compromised skin integrity Goals: Patient/caregiver will verbalize understanding of skin care regimen Date Initiated: 03/21/2023 Target Resolution Date: 05/21/2023 [name], [name] ([id number]) [id number].pdf Page 6 of 9 Goal Status: Active Ulcer/skin breakdown will have a volume reduction of 30%  by week 4 Date Initiated: 03/21/2023 Target Resolution Date: 04/18/2023 Goal Status: Active Ulcer/skin breakdown will have a volume reduction of 50% by week 8 Date Initiated: 03/21/2023 Target Resolution Date: 05/16/2023 Goal Status: Active Ulcer/skin breakdown will have a volume reduction of 80% by week 12 Date Initiated: 03/21/2023 Target Resolution Date: 06/13/2023 Goal Status: Active Ulcer/skin breakdown will heal within 14 weeks Date Initiated: 03/21/2023 Target Resolution Date: 06/27/2023 Goal Status: Active Interventions: Assess patient/caregiver ability to obtain necessary supplies Assess patient/caregiver ability to perform ulcer/skin care regimen upon admission and as needed Assess ulceration(s) every visit Treatment Activities: Patient referred to home care : 03/21/2023 Skin care regimen initiated : 03/21/2023 Notes: Electronic Signature(s) Signed: 04/11/2023 12:52:01 PM By: Yevonne Pax RN Entered By: Yevonne Pax on 04/11/2023 12:52:00 -------------------------------------------------------------------------------- Pain Assessment Details Patient Name: Date of Service: [name], [name] 04/11/2023 12:30 PM Medical Record Number: [id number] Patient Account Number: [id number] Date of Birth/Sex: Treating RN: 07-Nov-1970 (52 y.o. [name] Primary Care : [name], [name] Other Clinician: Referring : Treating /Extender: [name] [place], [place] [name] in Treatment: 3 Active Problems Location of Pain Severity and Description of Pain Patient Has Paino No Site Locations [name], [name] ([id number]) [phone].pdf  Page 7 of 9 Pain Management and Medication Current Pain Management: Electronic Signature(s) Signed: 04/11/2023 12:50:16 PM By: Yevonne Pax RN Entered By: Yevonne Pax on 04/11/2023 12:50:16 -------------------------------------------------------------------------------- Patient/Caregiver Education  Details Patient Name: Date of Service: Samantha Lucius Conn 7/16/2024andnbsp12:30 PM Medical Record Number: [id number] Patient Account Number: [id number] Date of Birth/Gender: Treating RN: [birth year] (52 y.o. [name] Primary Care Physician: Bertram Denver Other Clinician: Referring Physician: Treating Physician/Extender: [name] [place], [place] [name] in Treatment: 3 Education Assessment Education Provided To: Patient Education Topics Provided Wound/Skin Impairment: Handouts: Caring for Your Ulcer Methods: Explain/Verbal Responses: State content correctly Electronic Signature(s) Signed: 04/13/2023 3:42:23 PM By: Yevonne Pax RN Entered By: Yevonne Pax on 04/11/2023 12:52:12 -------------------------------------------------------------------------------- Wound Assessment Details Patient Name: Date of Service: [name], [name] 04/11/2023 12:30 PM Medical Record Number: [id number] Patient Account Number: [id number] Date of Birth/Sex: Treating RN: 06/12/1971 (52 y.o. [name] Primary Care : Bertram Denver Other Clinician: Referring : Treating /Extender: [name] [place], [place] [name] in Treatment: 3 Wound Status Wound Number: 1 Primary Etiology: Pressure Ulcer [name], Handler Sharmayne Pratt ([id number]) [phone].pdf Page 8 of 9 Wound Location: Left Gluteus Wound Status: Open Wounding Event: Blister Comorbid History: Neuropathy Date Acquired: 03/07/2023 Weeks Of Treatment: 3 Clustered Wound: No Photos Wound Measurements Length: (cm) 1.5 Width: (cm) 4 Depth: (cm) 1 Area: (cm) 4.712 Volume: (cm) 4.712 % Reduction in Area: -233.2% % Reduction in Volume: -58.7% Epithelialization: None Tunneling: No Undermining: Yes Starting Position (o'clock): 11 Ending Position (o'clock): 3 Maximum Distance: (cm) 3.5 Wound Description Classification: Category/Stage III Exudate Amount: Medium Exudate Type:  Serosanguineous Exudate Color: red, brown Foul Odor After Cleansing: No Slough/Fibrino Yes Wound Bed Granulation Amount: Large (67-100%) Exposed Structure Granulation Quality: Red Fascia Exposed: No Necrotic Amount: Small (1-33%) Fat Layer (Subcutaneous Tissue) Exposed: Yes Necrotic Quality: Adherent Slough Tendon Exposed: No Muscle Exposed: No Joint Exposed: No Bone Exposed: No Electronic Signature(s) Signed: 04/18/2023 9:15:55 AM By: Yevonne Pax RN Previous Signature: 04/11/2023 12:51:05 PM Version By: Yevonne Pax RN Entered By: Yevonne Pax on 04/18/2023 09:15:55 -------------------------------------------------------------------------------- Vitals Details Patient Name: Date of Service: Samantha O Pratt, Sagal Pratt. 04/11/2023 12:30 PM Medical Record Number: [id number] Patient Account Number: [id number] Date of Birth/Sex: Treating RN: 11/28/1970 (52 y.o. [name] Primary Care : Bertram Denver Other Clinician: Referring : Treating /Extender: [name] [place], [place] [name] in Treatment: [address] NATSUE, GRONOWSKI ([id number]) [id number] Page 9 of 9 Vital Signs Time Taken: 12:49 Temperature (F): 98.2 Height (in): 64 Pulse (bpm): 83 Weight (lbs): 166 Respiratory Rate (breaths/min): 18 Body Mass Index (BMI): 28.5 Blood Pressure (mmHg): 124/82 Reference Range: 80 - 120 mg / dl Electronic Signature(s) Signed: 04/11/2023 12:50:10 PM By: Yevonne Pax RN Entered By: Yevonne Pax on 04/11/2023 12:50:10

## 2023-04-11 NOTE — Progress Notes (Addendum)
DENEENE, GURTNER ([id number]) [id number] Page 1 of 8 Visit Report for 04/11/2023 Chief Complaint Document Details Patient Name: Date of Service: [name] 04/11/2023 12:30 PM Medical Record Number: [id number] Patient Account Number: [id number] Date of Birth/Sex: Treating RN: 02-Oct-1970 (52 y.o. [name] Primary Care Provider: [name], [name] Other Clinician: Referring Provider: Treating Provider/Extender: [name] GO [place], [place] [name] in Treatment: 3 Information Obtained from: Patient Chief Complaint Pressure ulcer left gluteal region Electronic Signature(s) Signed: 04/11/2023 12:38:32 PM By: [name] PA-C Entered By: [name] on 04/11/2023 12:38:32 -------------------------------------------------------------------------------- Debridement Details Patient Name: Date of Service: Samantha O Pratt, [name] 04/11/2023 12:30 PM Medical Record Number: [id number] Patient Account Number: [id number] Date of Birth/Sex: Treating RN: [birth year] (52 y.o. [name] Primary Care Provider: [name], [name] Other Clinician: Referring Provider: Treating Provider/Extender: [name] GO [place], [place] [name] in Treatment: 3 Debridement Performed for Assessment: Wound #1 Left Gluteus Performed By: Physician [name], PA-C Debridement Type: Debridement Level of Consciousness (Pre-procedure): Awake and Alert Pre-procedure Verification/Time Out Yes - 13:00 Taken: Start Time: 13:00 Percent of Wound Bed Debrided: 100% T Area Debrided (cm): otal 4.71 Tissue and other material debrided: Viable, Non-Viable, Slough, Subcutaneous, Slough Level: Skin/Subcutaneous Tissue Debridement Description: Excisional Instrument: Curette Bleeding: Minimum Hemostasis Achieved: Pressure End Time: 13:06 Procedural Pain: 0 Post Procedural Pain: 0 Response to Treatment: Procedure was tolerated well Samantha, Pratt ([id number])  [id number] Page 2 of 8 Level of Consciousness (Post- Awake and Alert procedure): Post Debridement Measurements of Total Wound Length: (cm) 1.5 Stage: Category/Stage III Width: (cm) 4 Depth: (cm) 1 Volume: (cm) 4.712 Character of Wound/Ulcer Post Debridement: Improved Post Procedure Diagnosis Same as Pre-procedure Electronic Signature(s) Signed: 04/11/2023 5:59:11 PM By: [name] PA-C Signed: 04/13/2023 3:42:23 PM By: Yevonne Pax RN Entered By: Yevonne Pax on 04/11/2023 13:07:07 -------------------------------------------------------------------------------- HPI Details Patient Name: Date of Service: Samantha [name] Pratt, [name] 04/11/2023 12:30 PM Medical Record Number: [id number] Patient Account Number: [id number] Date of Birth/Sex: Treating RN: [birth year] (52 y.o. [name] Primary Care Provider: [name], [name] Other Clinician: Referring Provider: Treating Provider/Extender: [name] [place], [place] [name] in Treatment: 3 History of Present Illness HPI Description: 03-21-2023 upon evaluation today patient appears for initial evaluation regarding a wound that actually began around 03/07/2023. With that being said she has been having issues here with this area and then subsequently ended up in the hospital due to a high fever. She was discharged from the hospital and then started on oral antibiotics that was on 03-16-2023. The oral antibiotics are doxycycline and Keflex that she is currently taking. With that being said she was diagnosed with multiple sclerosis in 2000 [name] she has been in a well motorized wheelchair since 2014. She is a pleasant individual and unfortunately this is the issue that she is having currently with a pressure ulcer although this is the good news is it is the first time she has had this happen she has an excellent caregiver that takes great care of her and the family is very attentive. 03-28-2023 upon evaluation today  patient appears to be doing worse with regard to the irritation around the edges of her wound. The good news is the wound itself does not appear worse but the irritation is definitely not as good as even what I saw last week. In the interim since last week [name] her aide has actually broken her wrist and is no longer helping with  the dressing changes I think this is part of the issue here. Fortunately I do not see any evidence of infection locally or systemically which is good news. 04-04-2023 upon evaluation today patient's wounds are actually showing signs of significant improvement which is great news. Fortunately I do not see any evidence of worsening overall I think that her family is making excellent progress towards getting this closed she is doing a good job offloading we will try to get the air-fluidized bed but that is just not working out her insurance at this point is denying this. She does have an alternating air mattress at home although it has been apparently on static mode I did advise this to be switched to alternating mode. 04-11-2023 upon evaluation today patient appears to be doing better in regard to her wound. She has been tolerating the dressing changes without complication. Fortunately I do not see any signs of active infection locally or systemically at this time. Her wound does have a lot of blue-green drainage which has made concerned about Pseudomonas hanging out of the area. Electronic Signature(s) Signed: 04/11/2023 1:28:44 PM By: [name] PA-C Entered By: [name] on 04/11/2023 13:28:43 [name] ([id number]) [id number] Page 3 of 8 -------------------------------------------------------------------------------- Physical Exam Details Patient Name: Date of Service: Samantha, Pratt 04/11/2023 12:30 PM Medical Record Number: [id number] Patient Account Number: [id number] Date of Birth/Sex: Treating RN: [birth year] (52 y.o. [name] Primary Care Provider: [name], [name] Other Clinician: Referring Provider: Treating Provider/Extender: [name] [place], [place] [name] in Treatment: 3 Constitutional Obese and well-hydrated in no acute distress. Respiratory normal breathing without difficulty. Psychiatric this patient is able to make decisions and demonstrates good insight into disease process. Alert and Oriented x 3. pleasant and cooperative. Notes Upon inspection patient's wound bed actually showed signs of good granulation epithelization at this point. Fortunately I do not see any signs of worsening overall and I am pleased in that regard. With that being said I do think that she is improving. With that being said I think that we will make sure this does not become infected and to that end I am going to go ahead and see about doing the topical gentamicin to have him do with the dressing changes at home as well. Electronic Signature(s) Signed: 04/11/2023 1:29:00 PM By: [name] PA-C Entered By: [name] on 04/11/2023 13:29:00 -------------------------------------------------------------------------------- Physician Orders Details Patient Name: Date of Service: Samantha O Pratt, [name] 04/11/2023 12:30 PM Medical Record Number: [id number] Patient Account Number: [id number] Date of Birth/Sex: Treating RN: 1971-02-16 (52 y.o. [name] Primary Care Provider: [name], [name] Other Clinician: Referring Provider: Treating Provider/Extender: [name] [place], [place] [name] in Treatment: 3 Verbal / Phone Orders: No Diagnosis Coding ICD-10 Coding Code Description [id number] Pressure ulcer of left buttock, stage 3 G35 Multiple sclerosis Follow-up Appointments Return Appointment in 1 week. [facility] Samantha, Pratt ([id number]) [id number] Page 4 of 8 Home Health Company: - [facility] fax: [phone] [facility] for wound care. May utilize  formulary equivalent dressing for wound treatment orders unless otherwise specified. Home Health Nurse may visit PRN to address patients wound care needs. Scheduled days for dressing changes to be completed; exception, patient has scheduled wound care visit that day. - Monday, Wednesday, Friday **Please direct any NON-WOUND related issues/requests for orders to patient's Primary Care Physician. **If current dressing causes regression in wound condition, may D/C ordered dressing product/s and apply Normal  Saline Moist Dressing daily until next Wound Healing Center or Other MD appointment. **Notify Wound Healing Center of regression in wound condition at [phone]. Bathing/ [employer] wounds with antibacterial soap and water. Anesthetic (Use 'Patient Medications' Section for Anesthetic Order Entry) Lidocaine applied to wound bed Off-Loading Roho cushion for wheelchair Hospital bed/mattress A fluidized (Group 3) - Home Health, please order for patient. ir Turn and reposition every 2 hours - Float heels on pillow when in the bed. Additional Orders / Instructions Follow Nutritious Diet and Increase Protein Intake Wound Treatment Wound #1 - Gluteus Wound Laterality: Left Cleanser: Soap and Water (Home Health) 1 x Per Day/30 Days Discharge Instructions: Gently cleanse wound with antibacterial soap, rinse and pat dry prior to dressing wounds Peri-Wound Care: AandD Ointment 1 x Per Day/30 Days Discharge Instructions: posterior upper thigh , thin layer Topical: Gentamicin 1 x Per Day/30 Days Discharge Instructions: Apply as directed by provider. Prim Dressing: Hydrofera Blue Ready Transfer Foam, 2.5x2.5 (in/in) (Home Health) 1 x Per Day/30 Days ary Discharge Instructions: 1/2 hydrofera blue rope inside wound, then top with foam hydrofera blue Secondary Dressing: ABD Pad 5x9 (in/in) 1 x Per Day/30 Days Discharge Instructions: Cover with ABD pad Secured With: Medipore T - 46M Medipore  H Soft Cloth Surgical T ape ape, 2x2 (in/yd) 1 x Per Day/30 Days Patient Medications llergies: latex A Notifications Medication Indication Start End 04/11/2023 gentamicin DOSE topical 0.1 % cream - cream topical once daily with each dressing change x 30 days Electronic Signature(s) Signed: 04/11/2023 1:30:16 PM By: [name] PA-C Entered By: [name] on 04/11/2023 13:30:16 -------------------------------------------------------------------------------- Problem List Details Patient Name: Date of Service: Samantha O Pratt, [name] 04/11/2023 12:30 PM Medical Record Number: [id number] Patient Account Number: [id number] Date of Birth/Sex: Treating RN: [birth year] (52 y.o. [name] Primary Care Provider: Bertram Denver Other Clinician: Referring Provider: Treating Provider/Extender: [name] [place], [place] [name], [name] ([id number]) [id number] Page 5 of 8 Weeks in Treatment: 3 Active Problems ICD-10 Encounter Code Description Active Date MDM Diagnosis L89.323 Pressure ulcer of left buttock, stage 3 03/21/2023 No Yes G35 Multiple sclerosis 03/21/2023 No Yes Inactive Problems Resolved Problems Electronic Signature(s) Signed: 04/11/2023 12:38:28 PM By: [name] PA-C Entered By: [name] on 04/11/2023 12:38:28 -------------------------------------------------------------------------------- Progress Note Details Patient Name: Date of Service: Samantha O Pratt, [name] 04/11/2023 12:30 PM Medical Record Number: [id number] Patient Account Number: [id number] Date of Birth/Sex: Treating RN: 1971-03-09 (52 y.o. [name] Primary Care Provider: [name], [name] Other Clinician: Referring Provider: Treating Provider/Extender: [name] [place], [place] [name] in Treatment: 3 Subjective Chief Complaint Information obtained from Patient Pressure ulcer left gluteal region History of Present Illness (HPI) 03-21-2023 upon evaluation today  patient appears for initial evaluation regarding a wound that actually began around 03/07/2023. With that being said she has been having issues here with this area and then subsequently ended up in the hospital due to a high fever. She was discharged from the hospital and then started on oral antibiotics that was on 03-16-2023. The oral antibiotics are doxycycline and Keflex that she is currently taking. With that being said she was diagnosed with multiple sclerosis in 2000 [name] she has been in a well motorized wheelchair since 2014. She is a pleasant individual and unfortunately this is the issue that she is having currently with a pressure ulcer although this is the good news is it is the first time she has had this happen  she has an excellent caregiver that takes great care of her and the family is very attentive. 03-28-2023 upon evaluation today patient appears to be doing worse with regard to the irritation around the edges of her wound. The good news is the wound itself does not appear worse but the irritation is definitely not as good as even what I saw last week. In the interim since last week [name] her aide has actually broken her wrist and is no longer helping with the dressing changes I think this is part of the issue here. Fortunately I do not see any evidence of infection locally or systemically which is good news. 04-04-2023 upon evaluation today patient's wounds are actually showing signs of significant improvement which is great news. Fortunately I do not see any evidence of worsening overall I think that her family is making excellent progress towards getting this closed she is doing a good job offloading we will try to get the air-fluidized bed but that is just not working out her insurance at this point is denying this. She does have an alternating air mattress at home although it has been apparently on static mode I did advise this to be switched to alternating mode. 04-11-2023 upon  evaluation today patient appears to be doing better in regard to her wound. She has been tolerating the dressing changes without complication. Fortunately I do not see any signs of active infection locally or systemically at this time. Her wound does have a lot of blue-green drainage which has made concerned about Pseudomonas hanging out of the area. Samantha, Pratt ([id number]) [id number] Page 6 of 8 Objective Constitutional Obese and well-hydrated in no acute distress. Vitals Time Taken: 12:49 PM, Height: 64 in, Weight: 166 lbs, BMI: 28.5, Temperature: 98.2 F, Pulse: 83 bpm, Respiratory Rate: 18 breaths/min, Blood Pressure: 124/82 mmHg. Respiratory normal breathing without difficulty. Psychiatric this patient is able to make decisions and demonstrates good insight into disease process. Alert and Oriented x 3. pleasant and cooperative. General Notes: Upon inspection patient's wound bed actually showed signs of good granulation epithelization at this point. Fortunately I do not see any signs of worsening overall and I am pleased in that regard. With that being said I do think that she is improving. With that being said I think that we will make sure this does not become infected and to that end I am going to go ahead and see about doing the topical gentamicin to have him do with the dressing changes at home as well. Integumentary (Hair, Skin) Wound #1 status is Open. Original cause of wound was Blister. The date acquired was: 03/07/2023. The wound has been in treatment 3 weeks. The wound is located on the Left Gluteus. The wound measures 1.5cm length x 4cm width x 1cm depth; 4.712cm^2 area and 4.712cm^3 volume. There is Fat Layer (Subcutaneous Tissue) exposed. There is no tunneling or undermining noted. There is a medium amount of serosanguineous drainage noted. There is large (67- 100%) red granulation within the wound bed. There is a small (1-33%) amount of  necrotic tissue within the wound bed including Adherent Slough. Assessment Active Problems ICD-10 Pressure ulcer of left buttock, stage 3 Multiple sclerosis Procedures Wound #1 Pre-procedure diagnosis of Wound #1 is a Pressure Ulcer located on the Left Gluteus . There was a Excisional Skin/Subcutaneous Tissue Debridement with a total area of 4.71 sq cm performed by [name], PA-C. With the following instrument(s): Curette to remove Viable and Non-Viable tissue/material. Material removed includes  Subcutaneous Tissue and Slough and. No specimens were taken. A time out was conducted at 13:00, prior to the start of the procedure. A Minimum amount of bleeding was controlled with Pressure. The procedure was tolerated well with a pain level of 0 throughout and a pain level of 0 following the procedure. Post Debridement Measurements: 1.5cm length x 4cm width x 1cm depth; 4.712cm^3 volume. Post debridement Stage noted as Category/Stage III. Character of Wound/Ulcer Post Debridement is improved. Post procedure Diagnosis Wound #1: Same as Pre-Procedure Plan Follow-up Appointments: Return Appointment in 1 week. Home Health: Home Health Company: - [facility] fax: [phone] [facility] for wound care. May utilize formulary equivalent dressing for wound treatment orders unless otherwise specified. Home Health Nurse may visit PRN to address patients wound care needs. Scheduled days for dressing changes to be completed; exception, patient has scheduled wound care visit that day. - Monday, Wednesday, Friday **Please direct any NON-WOUND related issues/requests for orders to patient's Primary Care Physician. **If current dressing causes regression in wound condition, may D/C ordered dressing product/s and apply Normal Saline Moist Dressing daily until next Wound Healing Center or Other MD appointment. **Notify Wound Healing Center of regression in wound condition at [phone]. Bathing/  Shower/ Hygiene: Wash wounds with antibacterial soap and water. Anesthetic (Use 'Patient Medications' Section for Anesthetic Order Entry): Lidocaine applied to wound bed Off-Loading: Roho cushion for wheelchair Hospital bed/mattress Air fluidized (Group 3) - Home Health, please order for patient. Turn and reposition every 2 hours - Float heels on pillow when in the bed. Additional Orders / Instructions: Follow Nutritious Diet and Increase Protein Intake Samantha, Pratt ([id number]) [id number] Page 7 of 8 The following medication(s) was prescribed: gentamicin topical 0.1 % cream cream topical once daily with each dressing change x 30 days starting 04/11/2023 WOUND #1: - Gluteus Wound Laterality: Left Cleanser: Soap and Water (Home Health) 1 x Per Day/30 Days Discharge Instructions: Gently cleanse wound with antibacterial soap, rinse and pat dry prior to dressing wounds Peri-Wound Care: AandD Ointment 1 x Per Day/30 Days Discharge Instructions: posterior upper thigh , thin layer Topical: Gentamicin 1 x Per Day/30 Days Discharge Instructions: Apply as directed by provider. Prim Dressing: Hydrofera Blue Ready Transfer Foam, 2.5x2.5 (in/in) (Home Health) 1 x Per Day/30 Days ary Discharge Instructions: 1/2 hydrofera blue rope inside wound, then top with foam hydrofera blue Secondary Dressing: ABD Pad 5x9 (in/in) 1 x Per Day/30 Days Discharge Instructions: Cover with ABD pad Secured With: Medipore T - 35M Medipore H Soft Cloth Surgical T ape ape, 2x2 (in/yd) 1 x Per Day/30 Days 1. I will go ahead and send in a prescription for the topical gentamicin to be used with each dressing change and I discussed that with the patient today. 2. I am also can recommend she should continue with the dressing changes with the [facility] on a regular basis. That seems to be doing very well and again she is changing this daily at this point. 3. I am also can recommend that she  should continue with appropriate offloading this is still to be of utmost importance. We will see patient back for reevaluation in 1 week here in the clinic. If anything worsens or changes patient will contact our office for additional recommendations. Electronic Signature(s) Signed: 04/11/2023 1:30:27 PM By: [name] PA-C Entered By: [name] on 04/11/2023 13:30:27 -------------------------------------------------------------------------------- SuperBill Details Patient Name: Date of Service: Samantha O Pratt, Zaylei L. 04/11/2023 Medical Record Number: [id number] Patient Account Number: [id number] Date  of Birth/Sex: Treating RN: 04-Dec-1970 (52 y.o. [name] Primary Care Provider: [name], [name] Other Clinician: Referring Provider: Treating Provider/Extender: [name] [place], [place] [name] in Treatment: 3 Diagnosis Coding ICD-10 Codes Code Description [phone] Pressure ulcer of left buttock, stage 3 G35 Multiple sclerosis Facility Procedures : CPT4 Code: [id number] Description: 11042 - DEB SUBQ TISSUE 20 SQ CM/< ICD-10 Diagnosis Description L89.323 Pressure ulcer of left buttock, stage 3 Modifier: Quantity: 1 Physician Procedures : CPT4 Code Description Modifier [id number] 99214 - WC PHYS LEVEL 4 - EST PT 25 ICD-10 Diagnosis Description L89.323 Pressure ulcer of left buttock, stage 3 G35 Multiple sclerosis Quantity: 1 : [id number] 11042 - WC PHYS SUBQ TISS 20 SQ CM ICD-10 Diagnosis Description ELLIE, GOODFELLOW ([id number]) [id number] Page 8 of 8 ICD-10 Diagnosis Description L89.323 Pressure ulcer of left buttock, stage 3 Quantity: 1 Electronic Signature(s) Signed: 04/11/2023 1:30:43 PM By: [name] PA-C Entered By: [name] on 04/11/2023 13:30:43

## 2023-04-13 ENCOUNTER — Ambulatory Visit: Admit: 2023-04-13 | Payer: MEDICARE

## 2023-04-13 ENCOUNTER — Ambulatory Visit: Admit: 2023-04-13 | Discharge: 2023-04-14 | Payer: MEDICARE

## 2023-04-13 DIAGNOSIS — G35 Multiple sclerosis: Principal | ICD-10-CM

## 2023-04-13 DIAGNOSIS — K5909 Other constipation: Principal | ICD-10-CM

## 2023-04-18 ENCOUNTER — Encounter: Payer: 59 | Admitting: Internal Medicine

## 2023-04-18 DIAGNOSIS — L89323 Pressure ulcer of left buttock, stage 3: Secondary | ICD-10-CM | POA: Diagnosis not present

## 2023-04-18 NOTE — Progress Notes (Signed)
DAI, APEL ([id number]) [id number] Page 1 of 6 Visit Report for 04/18/2023 HPI Details Patient Name: Date of Service: [name], [name] 04/18/2023 8:45 A M Medical Record Number: [id number] Patient Account Number: [id number] Date of Birth/Sex: Treating RN: 07/09/1971 (52 y.o. [name] Primary Care Provider: [name], Gilberto Pratt Other Clinician: Referring Provider: Treating Provider/Extender: [name] Weeks in Treatment: 4 History of Present Illness HPI Description: 03-21-2023 upon evaluation today patient appears for initial evaluation regarding a wound that actually began around 03/07/2023. With that being said she has been having issues here with this area and then subsequently ended up in the hospital due to a high fever. She was discharged from the hospital and then started on oral antibiotics that was on 03-16-2023. The oral antibiotics are doxycycline and Keflex that she is currently taking. With that being said she was diagnosed with multiple sclerosis in 2000 [name] she has been in a well motorized wheelchair since 2014. She is a pleasant individual and unfortunately this is the issue that she is having currently with a pressure ulcer although this is the good news is it is the first time she has had this happen she has an excellent caregiver that takes great care of her and the family is very attentive. 03-28-2023 upon evaluation today patient appears to be doing worse with regard to the irritation around the edges of her wound. The good news is the wound itself does not appear worse but the irritation is definitely not as good as even what I saw last week. In the interim since last week [name] her aide has actually broken her wrist and is no longer helping with the dressing changes I think this is part of the issue here. Fortunately I do not see any evidence of infection locally or systemically which is good  news. 04-04-2023 upon evaluation today patient's wounds are actually showing signs of significant improvement which is great news. Fortunately I do not see any evidence of worsening overall I think that her family is making excellent progress towards getting this closed she is doing a good job offloading we will try to get the air-fluidized bed but that is just not working out her insurance at this point is denying this. She does have an alternating air mattress at home although it has been apparently on static mode I did advise this to be switched to alternating mode. 04-11-2023 upon evaluation today patient appears to be doing Pratt in regard to her wound. She has been tolerating the dressing changes without complication. Fortunately I do not see any signs of active infection locally or systemically at this time. Her wound does have a lot of blue-green drainage which has made concerned about Pseudomonas hanging out of the area. 7/23; improved wound which is a pressure ulcer on the left buttock in a patient with advanced MS. She has a surface on her mattress at home as well as a Roho cushion for her wheelchair. We have been using gentamicin and Hydrofera Blue rope. Depth is slightly Pratt today Electronic Signature(s) Signed: 04/18/2023 4:43:46 PM By: Baltazar Najjar MD Entered By: Baltazar Najjar on 04/18/2023 09:47:52 -------------------------------------------------------------------------------- Physical Exam Details Patient Name: Date of Service: [name], [name] 04/18/2023 8:45 A M Medical Record Number: [id number] Patient Account Number: [id number] Date of Birth/Sex: Treating RN: September 24, 1971 (52 y.o. [name] Primary Care Provider: [name], Gilberto Pratt Other Clinician: Referring Provider: Treating Provider/Extender: Samantha  BSO N, MICHA EL G GO LDBECK, [place] Pratt Weeks in Treatment: 4 [name] ([id number]) [id number] Page 2 of 6 Constitutional Sitting  or standing Blood Pressure is within target range for patient.. Pulse regular and within target range for patient.Marland Kitchen Respirations regular, non-labored and within target range.. Temperature is normal and within the target range for the patient.Marland Kitchen appears in no distress. Notes Wound exam left buttock. Wound has improved in terms of size there were satellite lesions at 1 point per our intake nurse not now about 1 cm of depth and some undermining to the left of the small circular wound. There is no obvious soft tissue infection or crepitus no purulent drainage Electronic Signature(s) Signed: 04/18/2023 4:43:46 PM By: Baltazar Najjar MD Entered By: Baltazar Najjar on 04/18/2023 09:48:39 -------------------------------------------------------------------------------- Physician Orders Details Patient Name: Date of Service: [name], [name] 04/18/2023 8:45 A M Medical Record Number: [id number] Patient Account Number: [id number] Date of Birth/Sex: Treating RN: 09-23-1971 (52 y.o. [name] Primary Care Provider: [name], Gilberto Pratt Other Clinician: Referring Provider: Treating Provider/Extender: [name] Weeks in Treatment: 4 Verbal / Phone Orders: No Diagnosis Coding Follow-up Appointments Return Appointment in 1 week. Home Health Home Health Company: - [facility] fax: [phone] [facility] for wound care. May utilize formulary equivalent dressing for wound treatment orders unless otherwise specified. Home Health Nurse may visit PRN to address patients wound care needs. Scheduled days for dressing changes to be completed; exception, patient has scheduled wound care visit that day. - Monday, Wednesday, Friday **Please direct any NON-WOUND related issues/requests for orders to patient's Primary Care Physician. **If current dressing causes regression in wound condition, may D/C ordered dressing product/s and apply Normal Saline Moist Dressing daily  until next Wound Healing Center or Other MD appointment. **Notify Wound Healing Center of regression in wound condition at [phone]. Bathing/ [employer] wounds with antibacterial soap and water. Anesthetic (Use 'Patient Medications' Section for Anesthetic Order Entry) Lidocaine applied to wound bed Off-Loading Roho cushion for wheelchair Hospital bed/mattress A fluidized (Group 3) - Home Health, please order for patient. ir Turn and reposition every 2 hours - Float heels on pillow when in the bed. Additional Orders / Instructions Follow Nutritious Diet and Increase Protein Intake Wound Treatment Wound #1 - Gluteus Wound Laterality: Left Cleanser: Soap and Water (Home Health) 1 x Per Day/30 Days Discharge Instructions: Gently cleanse wound with antibacterial soap, rinse and pat dry prior to dressing wounds Peri-Wound Care: AandD Ointment 1 x Per Day/30 Days Discharge Instructions: posterior upper thigh , thin layer Topical: Gentamicin 1 x Per Day/30 Days Discharge Instructions: Apply as directed by provider. Prim Dressing: Hydrofera Blue Ready Transfer Foam, 2.5x2.5 (in/in) (Home Health) 1 x Per Day/30 Days ary MERIAH, SHANDS ([id number]) [id number] Page 3 of 6 Discharge Instructions: 1/2 hydrofera blue rope inside wound, then top with foam hydrofera blue Secondary Dressing: ABD Pad 5x9 (in/in) 1 x Per Day/30 Days Discharge Instructions: Cover with ABD pad Secured With: Medipore T - 47M Medipore H Soft Cloth Surgical T ape ape, 2x2 (in/yd) 1 x Per Day/30 Days Electronic Signature(s) Signed: 04/18/2023 9:43:42 AM By: Yevonne Pax RN Signed: 04/18/2023 4:43:46 PM By: Baltazar Najjar MD Entered By: Yevonne Pax on 04/18/2023 09:43:42 -------------------------------------------------------------------------------- Problem List Details Patient Name: Date of Service: [name], [name] 04/18/2023 8:45 A M Medical Record Number:  [id number] Patient Account Number: [id number] Date of Birth/Sex: Treating RN: 1970/10/05 (52  y.o. 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, stage 3 03/21/2023 No Yes G35 Multiple sclerosis 03/21/2023 No Yes Inactive Problems Resolved Problems Electronic Signature(s) Signed: 04/18/2023 4:43:46 PM By: Baltazar Najjar MD Entered By: Baltazar Najjar on 04/18/2023 09:46:55 Progress Note Details -------------------------------------------------------------------------------- [name] ([id number]) [id number] Page 4 of 6 Patient Name: Date of Service: [name], [name] 04/18/2023 8:45 A M Medical Record Number: [id number] Patient Account Number: [id number] Date of Birth/Sex: Treating RN: [birth year] (53 y.o. [name] Primary Care Provider: [name], Gilberto Pratt Other Clinician: Referring Provider: Treating Provider/Extender: [name] Weeks in Treatment: 4 Subjective History of Present Illness (HPI) 03-21-2023 upon evaluation today patient appears for initial evaluation regarding a wound that actually began around 03/07/2023. With that being said she has been having issues here with this area and then subsequently ended up in the hospital due to a high fever. She was discharged from the hospital and then started on oral antibiotics that was on 03-16-2023. The oral antibiotics are doxycycline and Keflex that she is currently taking. With that being said she was diagnosed with multiple sclerosis in 2000 [name] she has been in a well motorized wheelchair since 2014. She is a pleasant individual and unfortunately this is the issue that she is having currently with a pressure  ulcer although this is the good news is it is the first time she has had this happen she has an excellent caregiver that takes great care of her and the family is very attentive. 03-28-2023 upon evaluation today patient appears to be doing worse with regard to the irritation around the edges of her wound. The good news is the wound itself does not appear worse but the irritation is definitely not as good as even what I saw last week. In the interim since last week [name] her aide has actually broken her wrist and is no longer helping with the dressing changes I think this is part of the issue here. Fortunately I do not see any evidence of infection locally or systemically which is good news. 04-04-2023 upon evaluation today patient's wounds are actually showing signs of significant improvement which is great news. Fortunately I do not see any evidence of worsening overall I think that her family is making excellent progress towards getting this closed she is doing a good job offloading we will try to get the air-fluidized bed but that is just not working out her insurance at this point is denying this. She does have an alternating air mattress at home although it has been apparently on static mode I did advise this to be switched to alternating mode. 04-11-2023 upon evaluation today patient appears to be doing Pratt in regard to her wound. She has been tolerating the dressing changes without complication. Fortunately I do not see any signs of active infection locally or systemically at this time. Her wound does have a lot of blue-green drainage which has made concerned about Pseudomonas hanging out of the area. 7/23; improved wound which is a pressure ulcer on the left buttock in a patient with advanced MS. She has a surface on her mattress at home as well as a Roho cushion for her wheelchair. We have been using gentamicin  and Hydrofera Blue rope. Depth is slightly Pratt  today Objective Constitutional Sitting or standing Blood Pressure is within target range for patient.. Pulse regular and within target range for patient.Marland Kitchen Respirations regular, non-labored and within target range.. Temperature is normal and within the target range for the patient.Marland Kitchen appears in no distress. Vitals Time Taken: 8:50 AM, Height: 64 in, Weight: 166 lbs, BMI: 28.5, Temperature: 98.2 F, Pulse: 81 bpm, Respiratory Rate: 18 breaths/min, Blood Pressure: 124/80 mmHg. General Notes: Wound exam left buttock. Wound has improved in terms of size there were satellite lesions at 1 point per our intake nurse not now about 1 cm of depth and some undermining to the left of the small circular wound. There is no obvious soft tissue infection or crepitus no purulent drainage Integumentary (Hair, Skin) Wound #1 status is Open. Original cause of wound was Blister. The date acquired was: 03/07/2023. The wound has been in treatment 4 weeks. The wound is located on the Left Gluteus. The wound measures 1.5cm length x 4cm width x 1cm depth; 4.712cm^2 area and 4.712cm^3 volume. There is Fat Layer (Subcutaneous Tissue) exposed. There is no tunneling noted, however, there is undermining starting at 12:00 and ending at 3:00 with a maximum distance of 2.3cm. There is a medium amount of serosanguineous drainage noted. There is large (67-100%) red granulation within the wound bed. There is a small (1-33%) amount of necrotic tissue within the wound bed including Adherent Slough. Assessment Active Problems ICD-10 Pressure ulcer of left buttock, stage 3 Multiple sclerosis Plan Follow-up Appointments: Return Appointment in 1 week. Home Health: Home Health Company: - [facility] fax: [phone] [name], [name] ([id number]) [id number] Page 5 of 6 CONTINUE Home Health for wound care. May utilize formulary equivalent dressing for wound treatment orders unless otherwise specified. Home  Health Nurse may visit PRN to address patients wound care needs. Scheduled days for dressing changes to be completed; exception, patient has scheduled wound care visit that day. - Monday, Wednesday, Friday **Please direct any NON-WOUND related issues/requests for orders to patient's Primary Care Physician. **If current dressing causes regression in wound condition, may D/C ordered dressing product/s and apply Normal Saline Moist Dressing daily until next Wound Healing Center or Other MD appointment. **Notify Wound Healing Center of regression in wound condition at [phone]. Bathing/ Shower/ Hygiene: Wash wounds with antibacterial soap and water. Anesthetic (Use 'Patient Medications' Section for Anesthetic Order Entry): Lidocaine applied to wound bed Off-Loading: Roho cushion for wheelchair Hospital bed/mattress Air fluidized (Group 3) - Home Health, please order for patient. Turn and reposition every 2 hours - Float heels on pillow when in the bed. Additional Orders / Instructions: Follow Nutritious Diet and Increase Protein Intake WOUND #1: - Gluteus Wound Laterality: Left Cleanser: Soap and Water (Home Health) 1 x Per Day/30 Days Discharge Instructions: Gently cleanse wound with antibacterial soap, rinse and pat dry prior to dressing wounds Peri-Wound Care: AandD Ointment 1 x Per Day/30 Days Discharge Instructions: posterior upper thigh , thin layer Topical: Gentamicin 1 x Per Day/30 Days Discharge Instructions: Apply as directed by provider. Prim Dressing: Hydrofera Blue Ready Transfer Foam, 2.5x2.5 (in/in) (Home Health) 1 x Per Day/30 Days ary Discharge Instructions: 1/2 hydrofera blue rope inside wound, then top with foam hydrofera blue Secondary Dressing: ABD Pad 5x9 (in/in) 1 x Per Day/30 Days Discharge Instructions: Cover with ABD pad Secured With: Medipore T - 67M Medipore H Soft Cloth Surgical T ape ape, 2x2 (in/yd) 1 x Per Day/30 Days 1. No need to change  the dressing  here which is gentamicin and Hydrofera Blue. Her drainage has improved per our intake nurse Deb slightly improved as well 2. I emphasized pressure relief even that in setting of a surface on her mattress and a Roho cushion. She apparently spends about 3 hours a day in the wheelchair not much beyond that. 3. She has home health as well as her sister changing the dressings Electronic Signature(s) Signed: 04/18/2023 4:43:46 PM By: Baltazar Najjar MD Entered By: Baltazar Najjar on 04/18/2023 09:49:39 -------------------------------------------------------------------------------- SuperBill Details Patient Name: Date of Service: [name], [name] 04/18/2023 Medical Record Number: [id number] Patient Account Number: [id number] Date of Birth/Sex: Treating RN: October 13, 1970 (52 y.o. [name] Primary Care Provider: [name], Gilberto Pratt Other Clinician: Referring Provider: Treating Provider/Extender: [name] Weeks in Treatment: 4 Diagnosis Coding ICD-10 Codes Code Description [id number] Pressure ulcer of left buttock, stage 3 G35 Multiple sclerosis Facility Procedures : CPT4 Code: [id number] Description: 33295 - WOUND CARE VISIT-LEV 2 EST PT Modifier: Quantity: 1 Physician Procedures : CPT4 Code Description Modifier DEWANNA, HURSTON ([id number]) [id number] - WC PHYS LEVEL 3 - EST PT 1 ICD-10 Diagnosis Description 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

## 2023-04-18 NOTE — Progress Notes (Addendum)
[name] ([id number]) [id number] Page 1 of 9 Visit Report for 04/18/2023 Arrival Information Details Patient Name: Date of Service: [name], [name] 04/18/2023 8:45 A M Medical Record Number: [id number] Patient Account Number: [id number] Date of Birth/Sex: Treating RN: 1971/08/22 (52 y.o. [name] Primary Care : Bertram Denver Other Clinician: Referring : Treating /Extender: RO BSO N, MICHA EL G GO LDBECK, LA UREN Weeks in Treatment: 4 Visit Information History Since Last Visit Added or deleted any medications: No Patient Arrived: Wheel Chair Any new allergies or adverse reactions: No Arrival Time: 08:50 Had a fall or experienced change in No Accompanied By: caregiver activities of daily living that may affect Transfer Assistance: None risk of falls: Patient Identification Verified: Yes Signs or symptoms of abuse/neglect since last visito No Secondary Verification Process Completed: Yes Hospitalized since last visit: No Patient Requires Transmission-Based Precautions: No Implantable device outside of the clinic excluding No Patient Has Alerts: Yes cellular tissue based products placed in the center Patient Alerts: NOT DIABETIC since last visit: Has Dressing in Place as Prescribed: Yes Pain Present Now: No Electronic Signature(s) Signed: 04/18/2023 9:11:10 AM By: Yevonne Pax RN Entered By: Yevonne Pax on 04/18/2023 09:11:10 -------------------------------------------------------------------------------- Clinic Level of Care Assessment Details Patient Name: Date of Service: [name] 04/18/2023 8:45 A M Medical Record Number: [id number] Patient Account Number: [id number] Date of Birth/Sex: Treating RN: 08/17/1971 (52 y.o. [name] Primary Care : [name], [name] Other Clinician: Referring : Treating /Extender: RO BSO N, MICHA EL G GO LDBECK, LA UREN Weeks in  Treatment: 4 Clinic Level of Care Assessment Items TOOL 4 Quantity Score X- 1 0 Use when only an EandM is performed on FOLLOW-UP visit ASSESSMENTS - Nursing Assessment / Reassessment X- 1 10 Reassessment of Co-morbidities (includes updates in patient status) X- 1 5 Reassessment of Adherence to Treatment Plan CALANDRA, MADURA ([id number]) [id number] Page 2 of 9 ASSESSMENTS - Wound and Skin A ssessment / Reassessment X - Simple Wound Assessment / Reassessment - one wound 1 5 []  - 0 Complex Wound Assessment / Reassessment - multiple wounds []  - 0 Dermatologic / Skin Assessment (not related to wound area) ASSESSMENTS - Focused Assessment []  - 0 Circumferential Edema Measurements - multi extremities []  - 0 Nutritional Assessment / Counseling / Intervention []  - 0 Lower Extremity Assessment (monofilament, tuning fork, pulses) []  - 0 Peripheral Arterial Disease Assessment (using hand held doppler) ASSESSMENTS - Ostomy and/or Continence Assessment and Care []  - 0 Incontinence Assessment and Management []  - 0 Ostomy Care Assessment and Management (repouching, etc.) PROCESS - Coordination of Care X - Simple Patient / Family Education for ongoing care 1 15 []  - 0 Complex (extensive) Patient / Family Education for ongoing care []  - 0 Staff obtains Chiropractor, Records, T Results / Process Orders est []  - 0 Staff telephones HHA, Nursing Homes / Clarify orders / etc []  - 0 Routine Transfer to another Facility (non-emergent condition) []  - 0 Routine Hospital Admission (non-emergent condition) []  - 0 New Admissions / Manufacturing engineer / Ordering NPWT Apligraf, etc. , []  - 0 Emergency Hospital Admission (emergent condition) X- 1 10 Simple Discharge Coordination []  - 0 Complex (extensive) Discharge Coordination PROCESS - Special Needs []  - 0 Pediatric / Minor Patient Management []  - 0 Isolation Patient Management []  - 0 Hearing / Language /  Visual special needs []  - 0 Assessment of Community assistance (transportation, D/C planning, etc.) []  - 0 Additional assistance /  Altered mentation []  - 0 Support Surface(s) Assessment (bed, cushion, seat, etc.) INTERVENTIONS - Wound Cleansing / Measurement X - Simple Wound Cleansing - one wound 1 5 []  - 0 Complex Wound Cleansing - multiple wounds X- 1 5 Wound Imaging (photographs - any number of wounds) []  - 0 Wound Tracing (instead of photographs) X- 1 5 Simple Wound Measurement - one wound []  - 0 Complex Wound Measurement - multiple wounds INTERVENTIONS - Wound Dressings X - Small Wound Dressing one or multiple wounds 1 10 []  - 0 Medium Wound Dressing one or multiple wounds []  - 0 Large Wound Dressing one or multiple wounds []  - 0 Application of Medications - topical []  - 0 Application of Medications - injection INTERVENTIONS - Miscellaneous []  - 0 External ear exam ELNOR, RENOVATO ([id number]) [id number] Page 3 of 9 []  - 0 Specimen Collection (cultures, biopsies, blood, body fluids, etc.) []  - 0 Specimen(s) / Culture(s) sent or taken to Lab for analysis []  - 0 Patient Transfer (multiple staff / Michiel Sites Lift / Similar devices) []  - 0 Simple Staple / Suture removal (25 or less) []  - 0 Complex Staple / Suture removal (26 or more) []  - 0 Hypo / Hyperglycemic Management (close monitor of Blood Glucose) []  - 0 Ankle / Brachial Index (ABI) - do not check if billed separately X- 1 5 Vital Signs Has the patient been seen at the hospital within the last three years: Yes Total Score: 75 Level Of Care: New/Established - Level 2 Electronic Signature(s) Signed: 04/18/2023 4:16:14 PM By: Yevonne Pax RN Entered By: Yevonne Pax on 04/18/2023 09:44:27 -------------------------------------------------------------------------------- Encounter Discharge Information Details Patient Name: Date of Service: Samantha O RE, [name] 04/18/2023 8:45 A  M Medical Record Number: [id number] Patient Account Number: [id number] Date of Birth/Sex: Treating RN: August 16, 1971 (52 y.o. [name] Primary Care : Bertram Denver Other Clinician: Referring : Treating /Extender: RO BSO N, MICHA EL G GO LDBECK, LA UREN Weeks in Treatment: 4 Encounter Discharge Information Items Discharge Condition: Stable Ambulatory Status: Wheelchair Discharge Destination: Home Transportation: Private Auto Accompanied By: caregiver Schedule Follow-up Appointment: Yes Clinical Summary of Care: Electronic Signature(s) Signed: 04/18/2023 9:45:16 AM By: Yevonne Pax RN Entered By: Yevonne Pax on 04/18/2023 09:45:16 -------------------------------------------------------------------------------- Lower Extremity Assessment Details Patient Name: Date of Service: Samantha O RE, NYLANI MICHETTI 04/18/2023 8:45 A ZEINAB, RODWELL ([id number]) [id number] Page 4 of 9 Medical Record Number: [id number] Patient Account Number: [id number] Date of Birth/Sex: Treating RN: 1971/02/01 (52 y.o. [name] Primary Care : [name], [name] Other Clinician: Referring : Treating /Extender: RO BSO N, MICHA EL G GO LDBECK, LA UREN Weeks in Treatment: 4 Electronic Signature(s) Signed: 04/18/2023 9:13:08 AM By: Yevonne Pax RN Entered By: Yevonne Pax on 04/18/2023 09:13:08 -------------------------------------------------------------------------------- Multi Wound Chart Details Patient Name: Date of Service: Samantha [name] RE, Amarii L. 04/18/2023 8:45 A M Medical Record Number: [id number] Patient Account Number: [id number] Date of Birth/Sex: Treating RN: [birth year] (52 y.o. [name] Primary Care : [name], [name] Other Clinician: Referring : Treating /Extender: RO BSO N, MICHA EL G GO LDBECK, LA UREN Weeks in Treatment: 4 Vital Signs Height(in): 64 Pulse(bpm): 81 Weight(lbs):  166 Blood Pressure(mmHg): 124/80 Body Mass Index(BMI): 28.5 Temperature(F): 98.2 Respiratory Rate(breaths/min): 18 [1:Photos:] [N/A:N/A] Left Gluteus N/A N/A Wound Location: Blister N/A N/A Wounding Event: Pressure Ulcer N/A N/A Primary Etiology: Neuropathy N/A N/A Comorbid History: 03/07/2023 N/A N/A Date Acquired: 4 N/A N/A Weeks of Treatment: Open N/A N/A  Wound Status: No N/A N/A Wound Recurrence: 1.5x4x1 N/A N/A Measurements L x W x D (cm) 4.712 N/A N/A A (cm) : rea 4.712 N/A N/A Volume (cm) : -233.20% N/A N/A % Reduction in A rea: -58.70% N/A N/A % Reduction in Volume: 12 Starting Position 1 (o'clock): 3 Ending Position 1 (o'clock): 2.3 Maximum Distance 1 (cm): Yes N/A N/A Undermining: Category/Stage III N/A N/A Classification: Medium N/A N/A Exudate A mount: Serosanguineous N/A N/A Exudate Type: red, brown N/A N/A Exudate Color: Large (67-100%) N/A N/A Granulation A mount: Red N/A N/A Granulation Quality: Small (1-33%) N/A N/A Necrotic A mount: Fat Layer (Subcutaneous Tissue): Yes N/A N/A Exposed Structures: Fascia: No ARRYN, TERRONES ([id number]) [id number] Page 5 of 9 Tendon: No Muscle: No Joint: No Bone: No None N/A N/A Epithelialization: Treatment Notes Electronic Signature(s) Signed: 04/18/2023 9:13:13 AM By: Yevonne Pax RN Entered By: Yevonne Pax on 04/18/2023 09:13:13 -------------------------------------------------------------------------------- Multi-Disciplinary Care Plan Details Patient Name: Date of Service: [name], Keren L. 04/18/2023 8:45 A M Medical Record Number: [id number] Patient Account Number: [id number] Date of Birth/Sex: Treating RN: 1970/12/16 (52 y.o. [name] Primary Care : [name], [name] Other Clinician: Referring : Treating /Extender: RO BSO N, MICHA EL G GO LDBECK, LA UREN Weeks in Treatment: 4 Active Inactive Peripheral  Neuropathy Nursing Diagnoses: Knowledge deficit related to disease process and management of peripheral neurovascular dysfunction Potential alteration in peripheral tissue perfusion (select prior to confirmation of diagnosis) Goals: Patient/caregiver will verbalize understanding of disease process and disease management Date Initiated: 03/21/2023 Target Resolution Date: 04/20/2023 Goal Status: Active Interventions: Assess signs and symptoms of neuropathy upon admission and as needed Provide education on Management of Neuropathy and Related Ulcers Provide education on Management of Neuropathy upon discharge from the Wound Center Notes: Pressure Nursing Diagnoses: Knowledge deficit related to causes and risk factors for pressure ulcer development Knowledge deficit related to management of pressures ulcers Potential for impaired tissue integrity related to pressure, friction, moisture, and shear Goals: Patient will remain free from development of additional pressure ulcers Date Initiated: 03/21/2023 Target Resolution Date: 04/20/2023 Goal Status: Active Patient/caregiver will verbalize understanding of pressure ulcer management Date Initiated: 03/21/2023 Target Resolution Date: 04/20/2023 Goal Status: Active Interventions: Assess: immobility, friction, shearing, incontinence upon admission and as needed Assess offloading mechanisms upon admission and as needed DAYANNE, YIU ([id number]) [id number] Page 6 of 9 Assess potential for pressure ulcer upon admission and as needed Provide education on pressure ulcers Treatment Activities: Patient referred for home evaluation of offloading devices/mattresses : 03/21/2023 Notes: Soft Tissue Infection Nursing Diagnoses: Impaired tissue integrity Knowledge deficit related to disease process and management Knowledge deficit related to home infection control: handwashing, handling of soiled dressings, supply  storage Potential for infection: soft tissue Goals: Patient will remain free of wound infection Date Initiated: 03/21/2023 Target Resolution Date: 05/21/2023 Goal Status: Active Patient/caregiver will verbalize understanding of or measures to prevent infection and contamination in the home setting Date Initiated: 03/21/2023 Date Inactivated: 03/28/2023 Target Resolution Date: 03/21/2023 Goal Status: Met Patient's soft tissue infection will resolve Date Initiated: 03/21/2023 Date Inactivated: 04/11/2023 Target Resolution Date: 03/31/2023 Goal Status: Met Signs and symptoms of infection will be recognized early to allow for prompt treatment Date Initiated: 03/21/2023 Date Inactivated: 03/28/2023 Target Resolution Date: 03/21/2023 Goal Status: Met Interventions: Assess signs and symptoms of infection every visit Provide education on infection Treatment Activities: Systemic antibiotics : 03/21/2023 Notes: Wound/Skin Impairment Nursing Diagnoses: Impaired tissue integrity Knowledge deficit related to smoking impact on  wound healing Knowledge deficit related to ulceration/compromised skin integrity Goals: Patient/caregiver will verbalize understanding of skin care regimen Date Initiated: 03/21/2023 Target Resolution Date: 05/21/2023 Goal Status: Active Ulcer/skin breakdown will have a volume reduction of 30% by week 4 Date Initiated: 03/21/2023 Date Inactivated: 04/18/2023 Target Resolution Date: 04/18/2023 Goal Status: Unmet Unmet Reason: comorbidities Ulcer/skin breakdown will have a volume reduction of 50% by week 8 Date Initiated: 03/21/2023 Target Resolution Date: 05/16/2023 Goal Status: Active Ulcer/skin breakdown will have a volume reduction of 80% by week 12 Date Initiated: 03/21/2023 Target Resolution Date: 06/13/2023 Goal Status: Active Ulcer/skin breakdown will heal within 14 weeks Date Initiated: 03/21/2023 Target Resolution Date: 06/27/2023 Goal Status:  Active Interventions: Assess patient/caregiver ability to obtain necessary supplies Assess patient/caregiver ability to perform ulcer/skin care regimen upon admission and as needed Assess ulceration(s) every visit Treatment Activities: Patient referred to home care : 03/21/2023 Skin care regimen initiated : 03/21/2023 Notes: Electronic Signature(s) Signed: 04/18/2023 9:14:50 AM By: Yevonne Pax RN [name], Brynnan L ([id number]) By: Yevonne Pax RN [id number].pdf Page 7 of 9 Signed: 04/18/2023 9:14:50 AM Entered By: Yevonne Pax on 04/18/2023 09:14:50 -------------------------------------------------------------------------------- Pain Assessment Details Patient Name: Date of Service: NASHIKA, COKER 04/18/2023 8:45 A M Medical Record Number: [id number] Patient Account Number: [id number] Date of Birth/Sex: Treating RN: 09-Aug-1971 (52 y.o. [name] Primary Care : Bertram Denver Other Clinician: Referring : Treating /Extender: RO BSO N, MICHA EL G GO LDBECK, LA UREN Weeks in Treatment: 4 Active Problems Location of Pain Severity and Description of Pain Patient Has Paino No Site Locations Pain Management and Medication Current Pain Management: Electronic Signature(s) Signed: 04/18/2023 9:11:50 AM By: Yevonne Pax RN Entered By: Yevonne Pax on 04/18/2023 09:11:49 -------------------------------------------------------------------------------- Patient/Caregiver Education Details Patient Name: Date of Service: Samantha Lucius Conn 7/23/2024andnbsp8:45 A M Medical Record Number: [id number] Patient Account Number: [id number] Date of Birth/Gender: Treating RN: 06-08-1971 (52 y.o. [address], Marcinek, Danielson ([id number]) [phone].pdf Page 8 of 9 Primary Care Physician: Bertram Denver Other Clinician: Referring Physician: Treating Physician/Extender: RO BSO N, MICHA EL G GO LDBECK, LA UREN Weeks in  Treatment: 4 Education Assessment Education Provided To: Patient Education Topics Provided Pressure: Handouts: Pressure Injury: Prevention and Offloading Methods: Explain/Verbal Responses: State content correctly Electronic Signature(s) Signed: 04/18/2023 4:16:14 PM By: Yevonne Pax RN Entered By: Yevonne Pax on 04/18/2023 09:15:04 -------------------------------------------------------------------------------- Wound Assessment Details Patient Name: Date of Service: WILLENA, JEANCHARLES 04/18/2023 8:45 A M Medical Record Number: [id number] Patient Account Number: [id number] Date of Birth/Sex: Treating RN: 10-12-1970 (52 y.o. [name] Primary Care : [name], [name] Other Clinician: Referring : Treating /Extender: RO BSO N, MICHA EL G GO LDBECK, LA UREN Weeks in Treatment: 4 Wound Status Wound Number: 1 Primary Etiology: Pressure Ulcer Wound Location: Left Gluteus Wound Status: Open Wounding Event: Blister Comorbid History: Neuropathy Date Acquired: 03/07/2023 Weeks Of Treatment: 4 Clustered Wound: No Photos Wound Measurements Length: (cm) 1.5 Width: (cm) 4 Depth: (cm) 1 Area: (cm) 4.712 Volume: (cm) 4.712 Stupka, Veora L ([id number]) % Reduction in Area: -233.2% % Reduction in Volume: -58.7% Epithelialization: None Tunneling: No Undermining: Yes Starting Position (o'clock): 12 Ending Position (o'clock): 3 [id number] Page 9 of 9 Maximum Distance: (cm) 2.3 Wound Description Classification: Category/Stage III Exudate Amount: Medium Exudate Type: Serosanguineous Exudate Color: red, brown Foul Odor After Cleansing: No Slough/Fibrino Yes Wound Bed Granulation Amount: Large (67-100%) Exposed Structure Granulation Quality: Red Fascia Exposed: No Necrotic Amount: Small (1-33%)  Fat Layer (Subcutaneous Tissue) Exposed: Yes Necrotic Quality: Adherent Slough Tendon Exposed: No Muscle Exposed: No Joint  Exposed: No Bone Exposed: No Electronic Signature(s) Signed: 04/18/2023 9:12:59 AM By: Yevonne Pax RN Entered By: Yevonne Pax on 04/18/2023 09:12:59 -------------------------------------------------------------------------------- Vitals Details Patient Name: Date of Service: Samantha O RE, Daleyza L. 04/18/2023 8:45 A M Medical Record Number: [id number] Patient Account Number: [id number] Date of Birth/Sex: Treating RN: 02/19/1971 (52 y.o. [name] Primary Care : [name], [name] Other Clinician: Referring : Treating /Extender: RO BSO N, MICHA EL G GO LDBECK, LA UREN Weeks in Treatment: 4 Vital Signs Time Taken: 08:50 Temperature (F): 98.2 Height (in): 64 Pulse (bpm): 81 Weight (lbs): 166 Respiratory Rate (breaths/min): 18 Body Mass Index (BMI): 28.5 Blood Pressure (mmHg): 124/80 Reference Range: 80 - 120 mg / dl Electronic Signature(s) Signed: 04/18/2023 9:11:40 AM By: Yevonne Pax RN Entered By: Yevonne Pax on 04/18/2023 09:11:40

## 2023-04-24 ENCOUNTER — Emergency Department: Payer: 59

## 2023-04-24 ENCOUNTER — Other Ambulatory Visit: Payer: Self-pay

## 2023-04-24 ENCOUNTER — Inpatient Hospital Stay: Payer: 59

## 2023-04-24 ENCOUNTER — Inpatient Hospital Stay
Admission: EM | Admit: 2023-04-24 | Discharge: 2023-05-05 | DRG: 871 | Disposition: A | Discharge status: Home Health | Payer: 59 | Attending: Internal Medicine | Admitting: Internal Medicine

## 2023-04-24 ENCOUNTER — Encounter: Payer: Self-pay | Admitting: *Deleted

## 2023-04-24 DIAGNOSIS — Z91040 Latex allergy status: Secondary | ICD-10-CM | POA: Diagnosis not present

## 2023-04-24 DIAGNOSIS — B964 Proteus (mirabilis) (morganii) as the cause of diseases classified elsewhere: Secondary | ICD-10-CM | POA: Diagnosis not present

## 2023-04-24 DIAGNOSIS — Z5986 Financial insecurity: Secondary | ICD-10-CM

## 2023-04-24 DIAGNOSIS — U071 COVID-19: Secondary | ICD-10-CM | POA: Diagnosis present

## 2023-04-24 DIAGNOSIS — N390 Urinary tract infection, site not specified: Secondary | ICD-10-CM | POA: Diagnosis present

## 2023-04-24 DIAGNOSIS — G8220 Paraplegia, unspecified: Secondary | ICD-10-CM | POA: Diagnosis present

## 2023-04-24 DIAGNOSIS — Z800 Family history of malignant neoplasm of digestive organs: Secondary | ICD-10-CM

## 2023-04-24 DIAGNOSIS — L89329 Pressure ulcer of left buttock, unspecified stage: Secondary | ICD-10-CM | POA: Diagnosis not present

## 2023-04-24 DIAGNOSIS — E669 Obesity, unspecified: Secondary | ICD-10-CM | POA: Diagnosis present

## 2023-04-24 DIAGNOSIS — B965 Pseudomonas (aeruginosa) (mallei) (pseudomallei) as the cause of diseases classified elsewhere: Secondary | ICD-10-CM | POA: Diagnosis not present

## 2023-04-24 DIAGNOSIS — R600 Localized edema: Secondary | ICD-10-CM | POA: Diagnosis present

## 2023-04-24 DIAGNOSIS — M869 Osteomyelitis, unspecified: Secondary | ICD-10-CM | POA: Diagnosis present

## 2023-04-24 DIAGNOSIS — Z7985 Long-term (current) use of injectable non-insulin antidiabetic drugs: Secondary | ICD-10-CM | POA: Diagnosis not present

## 2023-04-24 DIAGNOSIS — J9601 Acute respiratory failure with hypoxia: Secondary | ICD-10-CM | POA: Diagnosis not present

## 2023-04-24 DIAGNOSIS — Z936 Other artificial openings of urinary tract status: Secondary | ICD-10-CM | POA: Diagnosis not present

## 2023-04-24 DIAGNOSIS — L0231 Cutaneous abscess of buttock: Secondary | ICD-10-CM | POA: Diagnosis present

## 2023-04-24 DIAGNOSIS — Z993 Dependence on wheelchair: Secondary | ICD-10-CM

## 2023-04-24 DIAGNOSIS — Z83438 Family history of other disorder of lipoprotein metabolism and other lipidemia: Secondary | ICD-10-CM

## 2023-04-24 DIAGNOSIS — Z90710 Acquired absence of both cervix and uterus: Secondary | ICD-10-CM

## 2023-04-24 DIAGNOSIS — Z8249 Family history of ischemic heart disease and other diseases of the circulatory system: Secondary | ICD-10-CM

## 2023-04-24 DIAGNOSIS — R6520 Severe sepsis without septic shock: Secondary | ICD-10-CM | POA: Diagnosis present

## 2023-04-24 DIAGNOSIS — A4152 Sepsis due to Pseudomonas: Secondary | ICD-10-CM | POA: Diagnosis present

## 2023-04-24 DIAGNOSIS — L0291 Cutaneous abscess, unspecified: Secondary | ICD-10-CM | POA: Diagnosis present

## 2023-04-24 DIAGNOSIS — Z8349 Family history of other endocrine, nutritional and metabolic diseases: Secondary | ICD-10-CM

## 2023-04-24 DIAGNOSIS — M792 Neuralgia and neuritis, unspecified: Secondary | ICD-10-CM | POA: Diagnosis present

## 2023-04-24 DIAGNOSIS — Z825 Family history of asthma and other chronic lower respiratory diseases: Secondary | ICD-10-CM

## 2023-04-24 DIAGNOSIS — I10 Essential (primary) hypertension: Secondary | ICD-10-CM | POA: Diagnosis present

## 2023-04-24 DIAGNOSIS — G35 Multiple sclerosis: Secondary | ICD-10-CM | POA: Diagnosis present

## 2023-04-24 DIAGNOSIS — Z9889 Other specified postprocedural states: Secondary | ICD-10-CM

## 2023-04-24 DIAGNOSIS — A4189 Other specified sepsis: Secondary | ICD-10-CM | POA: Diagnosis present

## 2023-04-24 DIAGNOSIS — L89153 Pressure ulcer of sacral region, stage 3: Secondary | ICD-10-CM | POA: Diagnosis present

## 2023-04-24 DIAGNOSIS — Z6832 Body mass index (BMI) 32.0-32.9, adult: Secondary | ICD-10-CM

## 2023-04-24 DIAGNOSIS — Z9981 Dependence on supplemental oxygen: Secondary | ICD-10-CM

## 2023-04-24 DIAGNOSIS — A4151 Sepsis due to Escherichia coli [E. coli]: Secondary | ICD-10-CM | POA: Diagnosis present

## 2023-04-24 DIAGNOSIS — E876 Hypokalemia: Secondary | ICD-10-CM | POA: Diagnosis present

## 2023-04-24 DIAGNOSIS — Z7951 Long term (current) use of inhaled steroids: Secondary | ICD-10-CM

## 2023-04-24 DIAGNOSIS — A4159 Other Gram-negative sepsis: Principal | ICD-10-CM | POA: Diagnosis present

## 2023-04-24 DIAGNOSIS — N319 Neuromuscular dysfunction of bladder, unspecified: Secondary | ICD-10-CM | POA: Diagnosis present

## 2023-04-24 DIAGNOSIS — Z79899 Other long term (current) drug therapy: Secondary | ICD-10-CM

## 2023-04-24 DIAGNOSIS — A419 Sepsis, unspecified organism: Principal | ICD-10-CM

## 2023-04-24 DIAGNOSIS — Z7409 Other reduced mobility: Secondary | ICD-10-CM | POA: Diagnosis present

## 2023-04-24 LAB — CBC WITH DIFFERENTIAL/PLATELET
Abs Immature Granulocytes: 0.05 10*3/uL (ref 0.00–0.07)
Basophils Absolute: 0.1 10*3/uL (ref 0.0–0.1)
Basophils Relative: 0 %
Eosinophils Absolute: 0.1 10*3/uL (ref 0.0–0.5)
Eosinophils Relative: 1 %
HCT: 44 % (ref 36.0–46.0)
Hemoglobin: 13.4 g/dL (ref 12.0–15.0)
Immature Granulocytes: 0 %
Lymphocytes Relative: 14 %
Lymphs Abs: 1.6 10*3/uL (ref 0.7–4.0)
MCH: 29.5 pg (ref 26.0–34.0)
MCHC: 30.5 g/dL (ref 30.0–36.0)
MCV: 96.7 fL (ref 80.0–100.0)
Monocytes Absolute: 0.8 10*3/uL (ref 0.1–1.0)
Monocytes Relative: 7 %
Neutro Abs: 9.3 10*3/uL — ABNORMAL HIGH (ref 1.7–7.7)
Neutrophils Relative %: 78 %
Platelets: 392 10*3/uL (ref 150–400)
RBC: 4.55 MIL/uL (ref 3.87–5.11)
RDW: 13.9 % (ref 11.5–15.5)
WBC: 11.9 10*3/uL — ABNORMAL HIGH (ref 4.0–10.5)
nRBC: 0 % (ref 0.0–0.2)

## 2023-04-24 LAB — COMPREHENSIVE METABOLIC PANEL
ALT: 11 U/L (ref 0–44)
AST: 15 U/L (ref 15–41)
Albumin: 3.9 g/dL (ref 3.5–5.0)
Alkaline Phosphatase: 56 U/L (ref 38–126)
Anion gap: 11 (ref 5–15)
BUN: 16 mg/dL (ref 6–20)
CO2: 23 mmol/L (ref 22–32)
Calcium: 9.1 mg/dL (ref 8.9–10.3)
Chloride: 101 mmol/L (ref 98–111)
Creatinine, Ser: 0.53 mg/dL (ref 0.44–1.00)
GFR, Estimated: >60 mL/min (ref >60)
Glucose, Bld: 94 mg/dL (ref 70–99)
Potassium: 3.1 mmol/L — ABNORMAL LOW (ref 3.5–5.1)
Sodium: 135 mmol/L (ref 135–145)
Total Bilirubin: 0.8 mg/dL (ref 0.3–1.2)
Total Protein: 8.7 g/dL — ABNORMAL HIGH (ref 6.5–8.1)

## 2023-04-24 LAB — URINALYSIS, W/ REFLEX TO CULTURE (INFECTION SUSPECTED)
Bilirubin Urine: NEGATIVE
Glucose, UA: NEGATIVE mg/dL
Hgb urine dipstick: NEGATIVE
Ketones, ur: NEGATIVE mg/dL
Nitrite: POSITIVE — AB
Protein, ur: NEGATIVE mg/dL
Specific Gravity, Urine: 1.01 (ref 1.005–1.030)
Squamous Epithelial / HPF: NONE SEEN /HPF (ref 0–5)
pH: 8 (ref 5.0–8.0)

## 2023-04-24 LAB — LACTIC ACID, PLASMA: Lactic Acid, Venous: 0.6 mmol/L (ref 0.5–1.9)

## 2023-04-24 LAB — RESP PANEL BY RT-PCR (RSV, FLU A&B, COVID)  RVPGX2
Influenza A by PCR: NEGATIVE
Influenza B by PCR: NEGATIVE
Resp Syncytial Virus by PCR: NEGATIVE
SARS Coronavirus 2 by RT PCR: POSITIVE — AB

## 2023-04-24 MED ORDER — SODIUM CHLORIDE 0.9 % IV SOLN
2.0000 g | Freq: Once | INTRAVENOUS | Status: AC
  Administered 2023-04-24: 2 g via INTRAVENOUS
  Filled 2023-04-24: qty 12.5

## 2023-04-24 MED ORDER — POLYETHYLENE GLYCOL 3350 17 G PO PACK: 17.0000 g | PACK | Freq: Every day | ORAL | Status: DC | PRN

## 2023-04-24 MED ORDER — ENOXAPARIN SODIUM 60 MG/0.6ML IJ SOSY
0.5000 mg/kg | PREFILLED_SYRINGE | INTRAMUSCULAR | Status: DC
  Administered 2023-04-25 – 2023-05-05 (×11): 42.5 mg via SUBCUTANEOUS
  Filled 2023-04-24 (×11): qty 0.6

## 2023-04-24 MED ORDER — IOHEXOL 300 MG/ML  SOLN
100.0000 mL | Freq: Once | INTRAMUSCULAR | Status: AC | PRN
  Administered 2023-04-25: 100 mL via INTRAVENOUS

## 2023-04-24 MED ORDER — HYDRALAZINE HCL 20 MG/ML IJ SOLN: 10.0000 mg | INTRAMUSCULAR | Status: DC | PRN

## 2023-04-24 MED ORDER — SODIUM CHLORIDE 0.9 % IV SOLN: INTRAVENOUS | Status: DC

## 2023-04-24 MED ORDER — ACETAMINOPHEN 650 MG RE SUPP: 650.0000 mg | Freq: Four times a day (QID) | RECTAL | Status: DC | PRN

## 2023-04-24 MED ORDER — LACTATED RINGERS IV BOLUS (SEPSIS)
1000.0000 mL | Freq: Once | INTRAVENOUS | Status: AC
  Administered 2023-04-24: 1000 mL via INTRAVENOUS

## 2023-04-24 MED ORDER — POTASSIUM CHLORIDE 20 MEQ PO PACK
60.0000 meq | PACK | Freq: Once | ORAL | Status: AC
  Administered 2023-04-25: 60 meq via ORAL

## 2023-04-24 MED ORDER — MORPHINE SULFATE (PF) 2 MG/ML IV SOLN
2.0000 mg | INTRAVENOUS | Status: DC | PRN
  Administered 2023-04-25: 2 mg via INTRAVENOUS
  Filled 2023-04-24: qty 1

## 2023-04-24 MED ORDER — SENNOSIDES-DOCUSATE SODIUM 8.6-50 MG PO TABS
2.0000 | ORAL_TABLET | Freq: Two times a day (BID) | ORAL | Status: DC
  Administered 2023-04-25 – 2023-05-05 (×21): 2 via ORAL
  Filled 2023-04-24 (×22): qty 2

## 2023-04-24 MED ORDER — ACETAMINOPHEN 325 MG PO TABS
650.0000 mg | ORAL_TABLET | Freq: Four times a day (QID) | ORAL | Status: DC | PRN
  Administered 2023-04-25 – 2023-04-27 (×2): 650 mg via ORAL
  Filled 2023-04-24: qty 2

## 2023-04-24 MED ORDER — SODIUM CHLORIDE 0.9% FLUSH
3.0000 mL | Freq: Two times a day (BID) | INTRAVENOUS | Status: DC
  Administered 2023-04-26 – 2023-05-05 (×19): 3 mL via INTRAVENOUS

## 2023-04-24 MED ORDER — METRONIDAZOLE 500 MG/100ML IV SOLN
500.0000 mg | Freq: Once | INTRAVENOUS | Status: AC
  Administered 2023-04-24: 500 mg via INTRAVENOUS
  Filled 2023-04-24: qty 100

## 2023-04-24 MED ORDER — VANCOMYCIN HCL IN DEXTROSE 1-5 GM/200ML-% IV SOLN
1000.0000 mg | Freq: Once | INTRAVENOUS | Status: AC
  Administered 2023-04-24: 1000 mg via INTRAVENOUS
  Filled 2023-04-24: qty 200

## 2023-04-24 MED ORDER — MORPHINE SULFATE (PF) 4 MG/ML IV SOLN
4.0000 mg | Freq: Once | INTRAVENOUS | Status: AC
  Administered 2023-04-24: 4 mg via INTRAVENOUS
  Filled 2023-04-24: qty 1

## 2023-04-24 MED ORDER — LABETALOL HCL 5 MG/ML IV SOLN: 20.0000 mg | INTRAVENOUS | Status: DC | PRN

## 2023-04-24 NOTE — Consult Note (Signed)
PHARMACY -  BRIEF ANTIBIOTIC NOTE   Pharmacy has received consult(s) for vancomycin and cefepime from an ED provider. Patient is also ordered metronidazole.  The patient's profile has been reviewed for ht/wt/allergies/indication/available labs.    One time order(s) placed for  --Vancomycin 1 g IV --Cefepime 2 g IV  Further antibiotics/pharmacy consults should be ordered by admitting physician if indicated.                       Thank you, Tressie Ellis 04/24/2023  7:57 PM

## 2023-04-24 NOTE — Assessment & Plan Note (Addendum)
Acute Respiratory Failure with Hypoxia Presented w/ cough and pleuritic chest pain.  No pneumonia on chest x-ray.  Initially not hypoxic, but O2 sats on room air dropped to 86% --Continue Paxlovid --Supportive / symptomatic care per orders --Supplement O2 to keep sats > 90%, wean as tolerated --Start IV decadron 6 mg daily  --Airborne and contact precautions

## 2023-04-24 NOTE — ED Provider Notes (Signed)
[facility] Provider Note    Event Date/Time   First MD Initiated Contact with Patient 04/24/23 1939     (approximate)  History   Chief Complaint: Wound Check  HPI  [name] is a 52 y.o. female with a past medical of multiple sclerosis, presents emergency department for fever to 101 and increased drainage from her sacral ulceration.  According to the patient she has a history of MS is largely bed and wheelchair-bound now.  She has a sacral ulceration that has been present for months.  She has a home health nurse that changes the packing Monday Wednesday and Friday.  They state over the last several days they have noticed some increased draining from the wound, today patient was febrile to 101 at home took Tylenol prior to arrival and is currently 99.6.  Patient is tachycardic and mildly tachypneic meeting sepsis criteria.  Patient also states she has had a slight cough for the past several days.  Patient has a urostomy but denies any abdominal pain.   Physical Exam   Triage Vital Signs: ED Triage Vitals  Encounter Vitals Group     BP 04/24/23 1837 (!) 150/76     Systolic BP Percentile --      Diastolic BP Percentile --      Pulse Rate 04/24/23 1837 (!) 116     Resp 04/24/23 1837 (!) 22     Temp 04/24/23 1837 99.6 F (37.6 C)     Temp Source 04/24/23 1837 Oral     SpO2 04/24/23 1837 94 %     Weight 04/24/23 1841 188 lb (85.3 kg)     Height 04/24/23 1841 5\' 4"  (1.626 m)     Head Circumference --      Peak Flow --      Pain Score 04/24/23 1840 9     Pain Loc --      Pain Education --      Exclude from Growth Chart --     Most recent vital signs: Vitals:   04/24/23 1837  BP: (!) 150/76  Pulse: (!) 116  Resp: (!) 22  Temp: 99.6 F (37.6 C)  SpO2: 94%    General: Awake, no distress.  CV:  Good peripheral perfusion.  Regular rate and rhythm  Resp:  Normal effort.  Equal breath sounds bilaterally.  Abd:  No distention.  Soft, nontender.   No rebound or guarding.  Benign abdomen. Other:  Is a small sacral ulceration however a large area of packing beneath the skin.  There is a mild amount of drainage but no significant drainage noted.   ED Results / Procedures / Treatments   RADIOLOGY  I have reviewed and interpreted chest x-ray images.  No obvious consolidation on my evaluation although small amount of fluid noted in the lower fissure on the right side.  Awaiting radiology read.   MEDICATIONS ORDERED IN ED: Medications  lactated ringers bolus 1,000 mL (has no administration in time range)  ceFEPIme (MAXIPIME) 2 g in sodium chloride 0.9 % 100 mL IVPB (has no administration in time range)  metroNIDAZOLE (FLAGYL) IVPB 500 mg (has no administration in time range)  vancomycin (VANCOCIN) IVPB 1000 mg/200 mL premix (has no administration in time range)     IMPRESSION / MDM / ASSESSMENT AND PLAN / ED COURSE  I reviewed the triage vital signs and the nursing notes.  Patient's presentation is most consistent with acute presentation with potential threat to life or  bodily function.  Patient presents emergency department for fever found to be tachycardic and tachypneic meeting sepsis criteria.  Will check labs, chest x-ray, COVID, urinalysis.  Patient does have a sacral ulceration however does not appear to be significantly infected or at least significant drainage at this time.  Packing is present currently.  Lab work pending.  Patient's labs have resulted showing a mild leukocytosis 11,900 otherwise reassuring CBC chemistry shows no significant findings including normal LFTs, urinalysis does show nitrite positive urine with many bacteria however this is a urostomy sample.  Patient has received broad-spectrum antibiotics we will send a urine culture we will send blood cultures.  Patient's COVID test has resulted positive this is very likely the cause of the patient's fever and weakness.  Given the patient's history of MS and  comorbidities we will admit to the hospital service for continued supportive care and treatment.  CRITICAL CARE Performed by: Minna Antis   Total critical care time: 30 minutes  Critical care time was exclusive of separately billable procedures and treating other patients.  Critical care was necessary to treat or prevent imminent or life-threatening deterioration.  Critical care was time spent personally by me on the following activities: development of treatment plan with patient and/or surrogate as well as nursing, discussions with consultants, evaluation of patient's response to treatment, examination of patient, obtaining history from patient or surrogate, ordering and performing treatments and interventions, ordering and review of laboratory studies, ordering and review of radiographic studies, pulse oximetry and re-evaluation of patient's condition.   FINAL CLINICAL IMPRESSION(S) / ED DIAGNOSES   Sepsis COVID-19 UTI  Note:  This document was prepared using Dragon voice recognition software and may include unintentional dictation errors.   Minna Antis, MD 04/24/23 2300

## 2023-04-24 NOTE — Assessment & Plan Note (Addendum)
Patient has an abscess of the left gluteal area close to the ischial process.  This is markedly undermined.  Grossly infected in terms of purulent drainage.  Culture has been sent.  I will treat with vancomycin.  Given patient having limitation of her sensorium because of multiple sclerosis, it is noted that the patient had a CAT scan abdomen pelvis done on March 15, 2023.  At the time no drainable abscess was seen.  However there was superficial edema close to the left ischial tuberosity.  Therefore I will favor repeat imaging.  Wound care evaluation  S/p vancomycin, cefepime and metronidazole in ER. C.w. vancomycn at this time with futher rx guided by culture and imaging.  Sepsis POA with no lactic acidosis.

## 2023-04-24 NOTE — H&P (Signed)
History and Physical    Patient: [name] MWN:[id number] DOB: 11-May-1971 DOA: 04/24/2023 DOS: the patient was seen and examined on 04/24/2023 PCP: [name], MD  Patient coming from: Home  Chief Complaint:  Chief Complaint  Patient presents with   Wound Check   HPI: [name] is a 52 y.o. female with medical history significant of multiple sclerosis, bedbound status, paraparesis/paraplegia, left upper extremity and marked weakness.  Patient also has sensory deficit below the waist.  Patient was in her usual state of health till approximately 2 weeks ago when she seems to have developed a new ulcer/abscess of the left gluteal area below the ischium.  Patient has been seeing wound care, however for the last for 5 days there has been increased discharge from the site.  Furthermore for the last 2 days patient has felt to be febrile at home up to 101 F.  Patient today had new onset of cough with chest pain with with coughing, otherwise no chest pain.  There is no sputum production no sensation of shortness of breath.  Given the entirety of patient's symptoms, patient was asked to come to the ER by patient primary care provider.  Patient has had abnormal vitals, afebrile in the ER and no hypoxia. Medical eval is sought. Review of Systems: As mentioned in the history of present illness. All other systems reviewed and are negative. 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 She has never used smokeless tobacco. She reports that she does not currently use alcohol. She reports that she does not use drugs.  Allergies  Allergen Reactions   Latex Swelling and Other (See Comments)    Reaction:  Facial swelling     Family History  Problem Relation Age of Onset   Osteoarthritis Mother    Hypertension Mother    Hyperlipidemia Mother    Thyroid disease Mother    Liver cancer Father    Asthma Brother     Prior to Admission medications   Medication Sig Start Date End Date Taking? Authorizing Provider  acetaminophen (TYLENOL) 500 MG tablet Take 500 mg by mouth every 6 (six) hours as needed.    [provider]  amantadine (SYMMETREL) 100 MG capsule Take 200 mg by mouth 2 (two) times daily.    [provider]  baclofen (LIORESAL) 20 MG tablet Take 40 mg by mouth 2 (two) times daily.    [provider]  cholecalciferol (VITAMIN D) 1000 units tablet Take 1,000 Units by mouth daily.    [provider]  Cranberry 400 MG CAPS Take 400 mg by mouth daily.    [provider]  DULoxetine (CYMBALTA) 60 MG capsule Take 60 mg by mouth daily. 01/09/23 01/04/24  [provider]  gabapentin (NEURONTIN) 100 MG capsule Take 200 mg by mouth every morning. 06/17/21   [provider]  gabapentin (NEURONTIN) 800 MG tablet Take 800 mg by mouth at bedtime. 07/08/21   [provider]  OZEMPIC, 1 MG/DOSE, 4 MG/3ML SOPN Inject 1 mg into the skin once a week. On Monday 09/02/21   [provider]  pantoprazole (PROTONIX) 40 MG tablet Take 1 tablet (40 mg total) by mouth 2 (two) times daily. 01/14/23   Sunnie Nielsen, DO  potassium chloride (KLOR-CON) 20 MEQ packet Take 40 mEq by mouth 2 (two) times daily. 01/09/23   [provider]  senna (SENOKOT) 8.6 MG tablet Take 2 tablets by mouth 2 (two) times daily. 06/24/22   [provider]  Silver (AQUACEL AG FOAM) [phone]" PADS Apply 1 each topically  daily in the afternoon. 03/16/23   Wouk, Wilfred Curtis, MD  STIMULANT LAXATIVE 8.6-50 MG tablet Take 1 tablet by mouth at bedtime. 11/07/21   [provider]  Wound Dressings (FOAM DRESSING CIRCULAR BORDER) PADS Apply 1 each topically every 3 (three) days. 03/16/23   Kathrynn Running, MD    Physical Exam: Vitals:   04/24/23 2130 04/24/23 2200 04/24/23 2230 04/24/23 2300  BP: (!) 144/85 134/63 (!) 160/72 (!) 141/71  Pulse: 96 96 (!) 103 (!) 101  Resp: (!) 26 (!) 27 (!) 23 (!) 25  Temp:      TempSrc:      SpO2:      Weight:      Height:       General: Patient is alert awake oriented x 3 no distress is apparent Respiratory exam: Bilateral intravesicular Cardiovascular exam S1-S2 normal Abdomen soft nontender, seems to be slightly distended, Left gluteal abscess is inspected/examined with RN.  No spreading erythema seen.  There is a skin puncture wound approximately a centimeter across.  With easy expression of purulent drainage from abscess, cultures drawn, culture probe undermines the wound easily. extremities: Bilateral paraplegia, with some reflexive movement to tactile stimulation.  Left upper extremity does not demonstrate antigravity strength.  This is all chronic per patient. Data Reviewed:  Labs on Admission:  Results for orders placed or performed during the hospital encounter of 04/24/23 (from the past 24 hour(s))  Comprehensive metabolic panel     Status: Abnormal   Collection Time: 04/24/23  8:33 PM  Result Value Ref Range   Sodium 135 135 - 145 mmol/L   Potassium 3.1 (L) 3.5 - 5.1 mmol/L   Chloride 101 98 - 111 mmol/L   CO2 23 22 - 32 mmol/L   Glucose, Bld 94 70 - 99 mg/dL   BUN 16 6 - 20 mg/dL   Creatinine, Ser 9.14 0.44 - 1.00 mg/dL   Calcium 9.1 8.9 - 78.2 mg/dL   Total Protein 8.7 (H) 6.5 - 8.1 g/dL   Albumin 3.9 3.5 - 5.0 g/dL   AST 15 15 - 41 U/L   ALT 11 0 - 44 U/L   Alkaline Phosphatase 56 38 - 126 U/L   Total Bilirubin 0.8 0.3 - 1.2 mg/dL   GFR,  Estimated >95 >62 mL/min   Anion gap 11 5 - 15  CBC with Differential     Status: Abnormal   Collection Time: 04/24/23  8:33 PM  Result Value Ref Range   WBC 11.9 (H) 4.0 - 10.5 K/uL   RBC 4.55 3.87 - 5.11 MIL/uL   Hemoglobin 13.4 12.0 - 15.0 g/dL   HCT 13.0 86.5 - 78.4 %   MCV 96.7 80.0 - 100.0 fL   MCH 29.5 26.0 - 34.0 pg   MCHC 30.5 30.0 - 36.0 g/dL   RDW 69.6 29.5 - 28.4 %   Platelets  392 150 - 400 K/uL   nRBC 0.0 0.0 - 0.2 %   Neutrophils Relative % 78 %   Neutro Abs 9.3 (H) 1.7 - 7.7 K/uL   Lymphocytes Relative 14 %   Lymphs Abs 1.6 0.7 - 4.0 K/uL   Monocytes Relative 7 %   Monocytes Absolute 0.8 0.1 - 1.0 K/uL   Eosinophils Relative 1 %   Eosinophils Absolute 0.1 0.0 - 0.5 K/uL   Basophils Relative 0 %   Basophils Absolute 0.1 0.0 - 0.1 K/uL   Immature Granulocytes 0 %   Abs Immature Granulocytes 0.05 0.00 - 0.07 K/uL  Urinalysis, w/ Reflex to Culture (Infection Suspected) -Urine, Suprapubic     Status: Abnormal   Collection Time: 04/24/23  8:52 PM  Result Value Ref Range   Specimen Source URINE, RANDOM    Color, Urine YELLOW (A) YELLOW   APPearance CLOUDY (A) CLEAR   Specific Gravity, Urine 1.010 1.005 - 1.030   pH 8.0 5.0 - 8.0   Glucose, UA NEGATIVE NEGATIVE mg/dL   Hgb urine dipstick NEGATIVE NEGATIVE   Bilirubin Urine NEGATIVE NEGATIVE   Ketones, ur NEGATIVE NEGATIVE mg/dL   Protein, ur NEGATIVE NEGATIVE mg/dL   Nitrite POSITIVE (A) NEGATIVE   Leukocytes,Ua LARGE (A) NEGATIVE   RBC / HPF 0-5 0 - 5 RBC/hpf   WBC, UA 6-10 0 - 5 WBC/hpf   Bacteria, UA MANY (A) NONE SEEN   Squamous Epithelial / HPF NONE SEEN 0 - 5 /HPF   Mucus PRESENT    Amorphous Crystal PRESENT   Resp panel by RT-PCR (RSV, Flu A&B, Covid) Anterior Nasal Swab     Status: Abnormal   Collection Time: 04/24/23  9:09 PM   Specimen: Anterior Nasal Swab  Result Value Ref Range   SARS Coronavirus 2 by RT PCR POSITIVE (A) NEGATIVE   Influenza A by PCR NEGATIVE NEGATIVE   Influenza B by PCR  NEGATIVE NEGATIVE   Resp Syncytial Virus by PCR NEGATIVE NEGATIVE  Lactic acid, plasma     Status: None   Collection Time: 04/24/23  9:30 PM  Result Value Ref Range   Lactic Acid, Venous 0.6 0.5 - 1.9 mmol/L   Basic Metabolic Panel: Recent Labs  Lab 04/24/23 2033  NA 135  K 3.1*  CL 101  CO2 23  GLUCOSE 94  BUN 16  CREATININE 0.53  CALCIUM 9.1   Liver Function Tests: Recent Labs  Lab 04/24/23 2033  AST 15  ALT 11  ALKPHOS 56  BILITOT 0.8  PROT 8.7*  ALBUMIN 3.9   No results for input(s): "LIPASE", "AMYLASE" in the last 168 hours. No results for input(s): "AMMONIA" in the last 168 hours. CBC: Recent Labs  Lab 04/24/23 2033  WBC 11.9*  NEUTROABS 9.3*  HGB 13.4  HCT 44.0  MCV 96.7  PLT 392   Cardiac Enzymes: No results for input(s): "CKTOTAL", "CKMB", "CKMBINDEX", "TROPONINIHS" in the last 168 hours.  BNP (last 3 results) No results for input(s): "PROBNP" in the last 8760 hours. CBG: No results for input(s): "GLUCAP" in the last 168 hours.  Radiological Exams on Admission:  DG Chest Port 1 View  Result Date: 04/24/2023 CLINICAL DATA:  Fever, sacral wound EXAM: PORTABLE CHEST 1 VIEW COMPARISON:  03/15/2023 FINDINGS: Single frontal view of the chest demonstrates an unremarkable cardiac silhouette. Minimal hypoventilatory changes at the lung bases. No acute airspace disease, effusion, or pneumothorax. No acute bony abnormality. IMPRESSION: 1. No acute intrathoracic process. Electronically Signed   By: Casimiro Needle  Manson Passey M.D.   On: 04/24/2023 20:30    EKG: Independently reviewed. NSR   Assessment and Plan: COVID-19 virus infection Associated with cough and associated chest pain, that is pleuritic in nature.  No concern of acute coronary syndrome, no finding of pneumonia on chest x-ray.  No hypoxia.  Will treat with remdesivir in the hospital.  Abscess Patient has an abscess of the left gluteal area close to the ischial process.  This is markedly undermined.   Grossly infected in terms of purulent drainage.  Culture has been sent.  I will treat with vancomycin.  Given patient having limitation of her sensorium because of multiple sclerosis, it is noted that the patient had a CAT scan abdomen pelvis done on March 15, 2023.  At the time no drainable abscess was seen.  However there was superficial edema close to the left ischial tuberosity.  Therefore I will favor repeat imaging.  Wound care evaluation  S/p vancomycin, cefepime and metronidazole in ER. C.w. vancomycn at this time with futher rx guided by culture and imaging.  Sepsis POA with no lactic acidosis.      Advance Care Planning:   Code Status: Prior full code per patient.  Consults: none at this time.   Family Communication:  per patient.  Severity of Illness: The appropriate patient status for this patient is INPATIENT. Inpatient status is judged to be reasonable and necessary in order to provide the required intensity of service to ensure the patient's safety. The patient's presenting symptoms, physical exam findings, and initial radiographic and laboratory data in the context of their chronic comorbidities is felt to place them at high risk for further clinical deterioration. Furthermore, it is not anticipated that the patient will be medically stable for discharge from the hospital within 2 midnights of admission.   * I certify that at the point of admission it is my clinical judgment that the patient will require inpatient hospital care spanning beyond 2 midnights from the point of admission due to high intensity of service, high risk for further deterioration and high frequency of surveillance required.*  Author: Nolberto Hanlon, MD 04/24/2023 11:35 PM  For on call review [email or web address].

## 2023-04-24 NOTE — Sepsis Progress Note (Signed)
[facility] following for sepsis protocol. 

## 2023-04-24 NOTE — Assessment & Plan Note (Signed)
Patient does not seem to be on chornic meds for same. Likely HTN due to pain today . Ordered pain meds. Trend BP. Will order Prn Meds.

## 2023-04-24 NOTE — Progress Notes (Signed)
CODE SEPSIS - PHARMACY COMMUNICATION  **Broad Spectrum Antibiotics should be administered within 1 hour of Sepsis diagnosis**  Time Code Sepsis Called/Page Received: 1953  Antibiotics Ordered: Cefepime + vancomycin + metronidazole  Time of 1st antibiotic administration: 2156  Additional action taken by pharmacy: Per discussion with RN, antibiotic administration delayed due to inability to obtain IV access  Tressie Ellis 04/24/2023  7:58 PM

## 2023-04-24 NOTE — ED Notes (Signed)
Attempted x 3 to get IV access, IV team consult placed. MD aware

## 2023-04-24 NOTE — ED Triage Notes (Addendum)
Pt brought in via ems from home per pt.  Pt lives with mother.  Pt has a sacral wound and believes wound is worse.  Pt alert.  Pt on stretcher in triage.  Pt has MS.   Pt also has a urostomy.

## 2023-04-24 NOTE — ED Notes (Signed)
First Nurse Note: Patient to ED via [facility] from group home (EMS unsure of name) for a sacral wound. Pt had wound for months but states its worse today. Reports fever of 100.3 and given 500mg  of tylenol at 3:30pm at group home. Bed bound at baseline.

## 2023-04-25 ENCOUNTER — Ambulatory Visit: Payer: 59 | Admitting: Physician Assistant

## 2023-04-25 ENCOUNTER — Encounter: Payer: Self-pay | Admitting: Internal Medicine

## 2023-04-25 DIAGNOSIS — Z9889 Other specified postprocedural states: Secondary | ICD-10-CM

## 2023-04-25 DIAGNOSIS — L0291 Cutaneous abscess, unspecified: Secondary | ICD-10-CM | POA: Diagnosis not present

## 2023-04-25 LAB — CBG MONITORING, ED
Glucose-Capillary: 117 mg/dL — ABNORMAL HIGH (ref 70–99)
Glucose-Capillary: 130 mg/dL — ABNORMAL HIGH (ref 70–99)
Glucose-Capillary: 133 mg/dL — ABNORMAL HIGH (ref 70–99)
Glucose-Capillary: 138 mg/dL — ABNORMAL HIGH (ref 70–99)
Glucose-Capillary: 98 mg/dL (ref 70–99)

## 2023-04-25 MED ORDER — HYDROCOD POLI-CHLORPHE POLI ER 10-8 MG/5ML PO SUER: 5.0000 mL | Freq: Two times a day (BID) | ORAL | Status: DC | PRN

## 2023-04-25 MED ORDER — INSULIN ASPART 100 UNIT/ML IJ SOLN
0.0000 [IU] | Freq: Three times a day (TID) | INTRAMUSCULAR | Status: DC
  Administered 2023-04-25 (×2): 2 [IU] via SUBCUTANEOUS
  Administered 2023-04-26: 5 [IU] via SUBCUTANEOUS
  Administered 2023-04-26 – 2023-05-04 (×9): 2 [IU] via SUBCUTANEOUS
  Filled 2023-04-25 (×11): qty 1

## 2023-04-25 MED ORDER — VITAMIN D 25 MCG (1000 UNIT) PO TABS
1000.0000 [IU] | ORAL_TABLET | Freq: Every day | ORAL | Status: DC
  Administered 2023-04-25 – 2023-05-05 (×11): 1000 [IU] via ORAL
  Filled 2023-04-25 (×11): qty 1

## 2023-04-25 MED ORDER — GUAIFENESIN ER 600 MG PO TB12
600.0000 mg | ORAL_TABLET | Freq: Two times a day (BID) | ORAL | Status: DC
  Administered 2023-04-25 – 2023-04-28 (×6): 600 mg via ORAL
  Filled 2023-04-25 (×6): qty 1

## 2023-04-25 MED ORDER — DULOXETINE HCL 30 MG PO CPEP
60.0000 mg | ORAL_CAPSULE | Freq: Every day | ORAL | Status: DC
  Administered 2023-04-25 – 2023-05-05 (×11): 60 mg via ORAL
  Filled 2023-04-25 (×6): qty 2
  Filled 2023-04-25: qty 1
  Filled 2023-04-25 (×4): qty 2

## 2023-04-25 MED ORDER — POTASSIUM CHLORIDE 20 MEQ PO PACK
40.0000 meq | PACK | Freq: Every day | ORAL | Status: DC
  Administered 2023-04-25 – 2023-05-05 (×11): 40 meq via ORAL
  Filled 2023-04-25 (×11): qty 2

## 2023-04-25 MED ORDER — INSULIN ASPART 100 UNIT/ML IJ SOLN
0.0000 [IU] | Freq: Every day | INTRAMUSCULAR | Status: DC
  Administered 2023-04-26: 2 [IU] via SUBCUTANEOUS
  Filled 2023-04-25: qty 1

## 2023-04-25 MED ORDER — VANCOMYCIN HCL IN DEXTROSE 1-5 GM/200ML-% IV SOLN
1000.0000 mg | Freq: Two times a day (BID) | INTRAVENOUS | Status: DC
  Administered 2023-04-25 – 2023-04-27 (×5): 1000 mg via INTRAVENOUS
  Filled 2023-04-25 (×5): qty 200

## 2023-04-25 MED ORDER — BACLOFEN 10 MG PO TABS
20.0000 mg | ORAL_TABLET | Freq: Four times a day (QID) | ORAL | Status: DC
  Administered 2023-04-25 – 2023-05-05 (×42): 20 mg via ORAL
  Filled 2023-04-25 (×42): qty 2

## 2023-04-25 MED ORDER — DEXAMETHASONE SODIUM PHOSPHATE 10 MG/ML IJ SOLN
6.0000 mg | INTRAMUSCULAR | Status: DC
  Administered 2023-04-25 – 2023-04-27 (×3): 6 mg via INTRAVENOUS
  Filled 2023-04-25 (×4): qty 1

## 2023-04-25 MED ORDER — GABAPENTIN 100 MG PO CAPS
200.0000 mg | ORAL_CAPSULE | Freq: Four times a day (QID) | ORAL | Status: DC
  Administered 2023-04-25 – 2023-05-05 (×42): 200 mg via ORAL
  Filled 2023-04-25 (×40): qty 2

## 2023-04-25 MED ORDER — VANCOMYCIN HCL 1250 MG/250ML IV SOLN: 1250.0000 mg | INTRAVENOUS | Status: DC

## 2023-04-25 MED ORDER — IPRATROPIUM-ALBUTEROL 0.5-2.5 (3) MG/3ML IN SOLN
3.0000 mL | RESPIRATORY_TRACT | Status: DC | PRN
  Administered 2023-04-25 – 2023-04-27 (×2): 3 mL via RESPIRATORY_TRACT
  Filled 2023-04-25 (×2): qty 3

## 2023-04-25 MED ORDER — NIRMATRELVIR/RITONAVIR (PAXLOVID)TABLET
3.0000 | ORAL_TABLET | Freq: Two times a day (BID) | ORAL | Status: AC
  Administered 2023-04-25 – 2023-04-29 (×9): 3 via ORAL
  Filled 2023-04-25 (×2): qty 30

## 2023-04-25 MED ORDER — AMANTADINE HCL 100 MG PO CAPS
200.0000 mg | ORAL_CAPSULE | Freq: Two times a day (BID) | ORAL | Status: DC
  Administered 2023-04-25 – 2023-05-05 (×21): 200 mg via ORAL
  Filled 2023-04-25 (×23): qty 2

## 2023-04-25 MED ORDER — VANCOMYCIN HCL IN DEXTROSE 1-5 GM/200ML-% IV SOLN
1000.0000 mg | Freq: Once | INTRAVENOUS | Status: AC
  Administered 2023-04-25: 1000 mg via INTRAVENOUS
  Filled 2023-04-25: qty 200

## 2023-04-25 MED ORDER — PANTOPRAZOLE SODIUM 40 MG PO TBEC
40.0000 mg | DELAYED_RELEASE_TABLET | Freq: Two times a day (BID) | ORAL | Status: DC
  Administered 2023-04-25 – 2023-05-05 (×22): 40 mg via ORAL
  Filled 2023-04-25 (×23): qty 1

## 2023-04-25 NOTE — Assessment & Plan Note (Signed)
Presume due to MS --Continue gabapentin and Cymbalta

## 2023-04-25 NOTE — ED Notes (Signed)
Pt SpO2 decreased to 86% on room air. Pt found to be sleeping. Pt aroused and instructed to take deep breaths. Pt placed on oxygen 2lpm via [place] with improvement. Will reassess oxygen needs when pt awakens.

## 2023-04-25 NOTE — Progress Notes (Signed)
Anticoagulation monitoring(Lovenox):  52 yo female ordered Lovenox 40 mg Q24h    Filed Weights   04/24/23 1841  Weight: 85.3 kg (188 lb)   BMI 32.3   Lab Results  Component Value Date   CREATININE 0.53 04/24/2023   CREATININE 0.63 03/16/2023   CREATININE 0.61 03/15/2023   Estimated Creatinine Clearance: 85.9 mL/min (by C-G formula based on SCr of 0.53 mg/dL). Hemoglobin & Hematocrit     Component Value Date/Time   HGB 13.4 04/24/2023 2033   HGB 12.6 10/05/2014 2100   HCT 44.0 04/24/2023 2033   HCT 37.7 10/05/2014 2100     Per Protocol for Patient with estCrcl > 30 ml/min and BMI > 30, will transition to Lovenox 42.5 mg Q24h.

## 2023-04-25 NOTE — Assessment & Plan Note (Addendum)
Seems stable.  Monitor closely, at risk of exacerbation with acute illnesses --continue baclofen --reposition frequently

## 2023-04-25 NOTE — Progress Notes (Addendum)
Progress Note   Patient: [name] UJW:[id number] DOB: [birth year] DOA: 04/24/2023     1 DOS: the patient was seen and examined on 04/25/2023   Brief hospital course: HPI on admission 04/24/23 per H&P ---  "[name] is a 52 y.o. female with medical history significant of multiple sclerosis, bedbound status, paraparesis/paraplegia, left upper extremity and marked weakness.  Patient also has sensory deficit below the waist.  Patient was in her usual state of health till approximately 2 weeks ago when she seems to have developed a new ulcer/abscess of the left gluteal area below the ischium.  Patient has been seeing wound care, however for the last for 5 days there has been increased discharge from the site.  Furthermore for the last 2 days patient has felt to be febrile at home up to 101 F.  Patient today had new onset of cough with chest pain with with coughing, otherwise no chest pain.  There is no sputum production no sensation of shortness of breath.  Given the entirety of patient's symptoms, patient was asked to come to the ER by patient primary care provider. "  Pt tested positive for Covid-19.  Was started on Paxlovid. Started on steroids 7/30 due to O2 sats 86% on room air. No pneumonia seen on chest x-ray.  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  Patient was admitted and started on empiric IV antibiotics. Abscess cultures were obtained on admission, gram strain growing GPC's.  IV vanomycin is continued.    Consults -  Infectious Disease  Assessment and Plan: * Abscess Left gluteal abscess Possible early osteomyelitis, left ischium --culture growing GPC's --treated in ED with vanc/cefepime/flagyl --continue IV vancomycin --consult infectious disease  Sepsis due to abscess and Covid-19 infection - POA w/tachycaria, tachypnea. Normal lactate.  COVID-19 virus infection Acute Respiratory  Failure with Hypoxia Presented w/ cough and pleuritic chest pain.  No pneumonia on chest x-ray.  Initially not hypoxic, but O2 sats on room air dropped to 86% --Continue Paxlovid --Supportive / symptomatic care per orders --Supplement O2 to keep sats > 90%, wean as tolerated --Start IV decadron 6 mg daily  --Airborne and contact precautions  Multiple sclerosis (HCC) Seems stable.  Monitor closely, at risk of exacerbation with acute illnesses --continue baclofen --reposition frequently  Obesity (BMI 30-39.9) Body mass index is 32.27 kg/m.   History of ileal conduit .  Primary hypertension Appears not on meds.  Possibly secondary to pain and acute.  IV hydralazine PRN.  Paraplegia (HCC) .  Neuropathic pain Presume due to MS --Continue gabapentin and Cymbalta        Subjective: Pt seen in ED, holding for a bed this AM.  She feels hot and is diaphoretic.  Denies feeling short of breath.  Reports chest pain with coughing.    Physical Exam: Vitals:   04/25/23 1730 04/25/23 1750 04/25/23 1751 04/25/23 1830  BP: (!) 127/56   134/73  Pulse: (!) 105  97 (!) 103  Resp: 20   16  Temp:      TempSrc:      SpO2:  91%  95%  Weight:      Height:       General exam: awake, alert, no acute distress HEENT: face diaphoretic, moist mucus membranes, hearing grossly normal  Respiratory system: CTAB, no wheezes, rales or rhonchi, mildly increased respiratory effort with mild accessory muscle use. Cardiovascular system: normal S1/S2, tachycardic,  regular rhythm, no JVD, murmurs, rubs, gallops, no pedal edema.   Gastrointestinal system: soft, NT, ND, no HSM felt, +bowel sounds. Central nervous system: A&O x 3. no gross focal neurologic deficits, normal speech Skin: warm, diaphoretic, no rashes seen on visualized skin Psychiatry: normal mood, congruent affect, judgement and insight appear normal   Data Reviewed:  Notable labs --- glucose 133, otherwise normal BMP CBC with WBC 11.3  otherwise normal  Micro - abscess culture - gram stain +GPC's  Family Communication: none present, will attempt to call  Disposition: Status is: Inpatient Remains inpatient appropriate because: severity of illness as above, needing oxygen and IV therapies, ongoing evaluation    Planned Discharge Destination: Home    Time spent: 46 minutes  Author: Pennie Banter, DO 04/25/2023 7:45 PM  For on call review [email or web address].

## 2023-04-25 NOTE — Assessment & Plan Note (Signed)
Body mass index is 32.27 kg/m².

## 2023-04-25 NOTE — Progress Notes (Signed)
Pharmacy Antibiotic Note  [name] is a 52 y.o. female admitted on 04/24/2023 with  wound infection - sacral abscess with purulent drainage.  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  Pharmacy has been consulted for Vancomycin dosing.  Note to be COVID + (new onset cough 2 days prior to presentation)     Today, 04/25/2023 Day 1 vancomycin Renal SCr 0.48 (at baseline) - due to bedbound state and LE paralysis - suspect CrCl will be overestimating true renal functon WBC 11.3 Afebrile COVID + Blood cx pending Wound cx of abscess pending (Stain with GPC)   Plan: Adjust vancomycin to 1000mg  IV q12h Calculated AUC 519.5 (Goal 400-600) Est trough 15 Vd 0.72 and SCr 0.8 - rounding up to adjust for paralysis/bedbound state Check trough as needed based on duration of vancomycin therapy or change in renal function Monitor renal function and culture result  Height: 5\' 4"  (162.6 cm) Weight: 85.3 kg (188 lb) IBW/kg (Calculated) : 54.7  Temp (24hrs), Avg:99 F (37.2 C), Min:98.6 F (37 C), Max:99.6 F (37.6 C)  Recent Labs  Lab 04/24/23 2033 04/24/23 2130 04/25/23 0317  WBC 11.9*  --  11.3*  CREATININE 0.53  --  0.48  LATICACIDVEN  --  0.6  --     Estimated Creatinine Clearance: 85.9 mL/min (by C-G formula based on SCr of 0.48 mg/dL).    Allergies  Allergen Reactions   Latex Swelling and Other (See Comments)    Reaction:  Facial swelling     Antimicrobials this admission:  7/29 Cefepime x 1 7/29 metronidazole x1 7/29 vanco >>    Dose adjustments this admission:   Microbiology results:  7/30 BCx: NGTD 7/29 abscess:  COVID +   Thank you for allowing pharmacy to be a part of this patient's care.  Juliette Alcide, PharmD, BCPS, BCIDP Work Cell: [phone] 04/25/2023 9:07 AM

## 2023-04-25 NOTE — Consult Note (Signed)
NAME: [name]  DOB: [birth year]  MRN: [id number]  Date/Time: 04/26/23  REQUESTING PROVIDER; Dr.griffith Subjective:  REASON FOR CONSULT: fever/covid/sacral wound ? [name] is a 51 y.o.female  with a history of MS, paraplegia, neurogenic bladder s/p ileal conduit , left medial gluteal  decubitus presented from home with fever and increased drainage from the sacral wound- pt is followed at wound clinic for sacral wound and is managed with topical hydrofera and gentamicin She had a new fever of 101 and EMS was called and she was brought to the hospital Vitals in the ED  04/24/23 18:37  BP 150/76 !  Temp 99.6 F (37.6 C)  Pulse Rate 116 !  Resp 22 !  SpO2 94 %  Labs  Latest Reference Range & Units 04/25/23  WBC 4.0 - 10.5 K/uL 11.3 (H)  Hemoglobin 12.0 - 15.0 g/dL 78.2  HCT 95.6 - 21.3 % 38.0  Platelets 150 - 400 K/uL 366  Creatinine 0.44 - 1.00 mg/dL 0.86    She had cough and nasal congestion COVID test was positive CXR No infiltrate CT abd and pelvis Soft tissue inflammatory changes in the left medial gluteal region with interim finding of gas and minimal fluid collection measuring 2.5 x 1.5 cm near the left ischial tuberosity, suspicious for small abscess.  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   Neurogenic bowel 08/12/2012   Neuropathic pain 05/21/2015    Past Surgical History:  Procedure Laterality Date   ABDOMINAL  HYSTERECTOMY  2001   BACK SURGERY  2004   REVISION UROSTOMY CUTANEOUS  06/2012    Social History   Socioeconomic History   Marital status: Single    Spouse name: Not on file   Number of children: Not on file   Years of education: Not on file   Highest education level: Not on file  Occupational History   Not on file  Tobacco Use   Smoking status: Never   Smokeless tobacco: Never  Substance and Sexual Activity   Alcohol use: Not Currently    Comment: socially   Drug use: No   Sexual activity: Not Currently  Other Topics Concern   Not on file  Social History Narrative   Not on file   Social Determinants of Health   Financial Resource Strain: Medium Risk (11/03/2022)   Received from [facility], [facility]   Overall Financial Resource Strain (CARDIA)    Difficulty of Paying Living Expenses: Somewhat hard  Food Insecurity: Patient Declined (04/25/2023)   Hunger Vital Sign    Worried About Running Out of Food in the Last Year: Patient declined    Ran Out of Food in the Last Year: Patient declined  Transportation Needs: Patient Declined (04/25/2023)   PRAPARE - Administrator, Civil Service (Medical): Patient declined    Lack of Transportation (Non-Medical): Patient declined  Physical Activity: Inactive (11/03/2022)   Received from [facility], [facility]   Exercise Vital Sign    Days of Exercise  per Week: 2 days    Minutes of Exercise per Session: 0 min  Stress: No Stress Concern Present (11/03/2022)   Received from [facility], [facility] of Occupational Health - Occupational Stress Questionnaire    Feeling of Stress : Not at all  Social Connections: Moderately Isolated (11/03/2022)   Received from [facility], [facility]   Social Connection and Isolation Panel [NHANES]    Frequency of Communication with Friends and Family: Three times a week    Frequency of Social Gatherings with Friends and Family: More than  three times a week    Attends Religious Services: More than 4 times per year    Active Member of [employer] or Organizations: No    Attends Banker Meetings: Never    Marital Status: Never married  Intimate Partner Violence: Patient Declined (04/25/2023)   Humiliation, Afraid, Rape, and Kick questionnaire    Fear of Current or Ex-Partner: Patient declined    Emotionally Abused: Patient declined    Physically Abused: Patient declined    Sexually Abused: Patient declined    Family History  Problem Relation Age of Onset   Osteoarthritis Mother    Hypertension Mother    Hyperlipidemia Mother    Thyroid disease Mother    Liver cancer Father    Asthma Brother    Allergies  Allergen Reactions   Latex Swelling and Other (See Comments)    Reaction:  Facial swelling    I? Current Facility-Administered Medications  Medication Dose Route Frequency Provider Last Rate Last Admin   0.9 %  sodium chloride infusion   Intravenous Continuous Nolberto Hanlon, MD 75 mL/hr at 04/25/23 1158 Rate Verify at 04/25/23 1158   acetaminophen (TYLENOL) tablet 650 mg  650 mg Oral Q6H PRN Nolberto Hanlon, MD   650 mg at 04/25/23 1005   Or   acetaminophen (TYLENOL) suppository 650 mg  650 mg Rectal Q6H PRN Nolberto Hanlon, MD       amantadine (SYMMETREL) capsule 200 mg  200 mg Oral BID Nolberto Hanlon, MD   200 mg at 04/25/23 1006   baclofen (LIORESAL) tablet 20 mg  20 mg Oral QID Nolberto Hanlon, MD   20 mg at 04/25/23 1356   cholecalciferol (VITAMIN D3) 25 MCG (1000 UNIT) tablet 1,000 Units  1,000 Units Oral Daily Nolberto Hanlon, MD   1,000 Units at 04/25/23 1007   dexamethasone (DECADRON) injection 6 mg  6 mg Intravenous Q24H Esaw Grandchild A, DO   6 mg at 04/25/23 1020   DULoxetine (CYMBALTA) DR capsule 60 mg  60 mg Oral Daily Nolberto Hanlon, MD   60 mg at 04/25/23 1007   enoxaparin (LOVENOX) injection 42.5 mg  0.5 mg/kg Subcutaneous Q24H Nolberto Hanlon, MD   42.5 mg at 04/25/23 1008   gabapentin (NEURONTIN) capsule 200 mg  200  mg Oral QID Nolberto Hanlon, MD   200 mg at 04/25/23 1419   hydrALAZINE (APRESOLINE) injection 10 mg  10 mg Intravenous Q4H PRN Nolberto Hanlon, MD       insulin aspart (novoLOG) injection 0-15 Units  0-15 Units Subcutaneous TID WC Nolberto Hanlon, MD   2 Units at 04/25/23 1022   insulin aspart (novoLOG) injection 0-5 Units  0-5 Units Subcutaneous QHS Nolberto Hanlon, MD       labetalol (NORMODYNE) injection 20 mg  20 mg Intravenous Q2H PRN Nolberto Hanlon, MD       morphine (PF) 2 MG/ML injection 2 mg  2 mg Intravenous Q3H PRN Nolberto Hanlon, MD   2 mg at 04/25/23 0328   nirmatrelvir/ritonavir (PAXLOVID) 3 tablet  3 tablet Oral BID Nolberto Hanlon, MD   3 tablet at 04/25/23 1009   pantoprazole (PROTONIX) EC tablet 40 mg  40 mg Oral BID Nolberto Hanlon, MD   40 mg at 04/25/23 1007   polyethylene glycol (MIRALAX / GLYCOLAX) packet 17 g  17 g Oral Daily PRN Nolberto Hanlon, MD       potassium chloride (KLOR-CON) packet 40 mEq  40 mEq Oral Daily Nolberto Hanlon, MD   40 mEq at 04/25/23 1009   senna-docusate (Senokot-S) tablet 2 tablet  2 tablet Oral BID Nolberto Hanlon, MD   2 tablet at 04/25/23 1007   sodium chloride flush (NS) 0.9 % injection 3 mL  3 mL Intravenous Q12H Nolberto Hanlon, MD       vancomycin (VANCOCIN) IVPB 1000 mg/200 mL premix  1,000 mg Intravenous Q12H Aleda Grana, RPH   Stopped at 04/25/23 1309   Current Outpatient Medications  Medication Sig Dispense Refill   acetaminophen (TYLENOL) 500 MG tablet Take 500 mg by mouth every 6 (six) hours as needed.     amantadine (SYMMETREL) 100 MG capsule Take 200 mg by mouth 2 (two) times daily.  5   baclofen (LIORESAL) 20 MG tablet Take 40 mg by mouth 2 (two) times daily.     cholecalciferol (VITAMIN D) 1000 units tablet Take 1,000 Units by mouth daily.     Cranberry 500 MG CHEW Chew 500 mg by mouth daily.     DULoxetine (CYMBALTA) 60 MG capsule Take 60 mg by mouth daily.     gabapentin (NEURONTIN) 100 MG capsule Take 200 mg by mouth every morning.     gabapentin (NEURONTIN) 800  MG tablet Take 800 mg by mouth at bedtime.     gentamicin cream (GARAMYCIN) 0.1 % Apply 1 Application topically daily.     lactulose (CHRONULAC) 10 GM/15ML solution Take 15 mLs by mouth daily as needed for mild constipation.     pantoprazole (PROTONIX) 40 MG tablet Take 1 tablet (40 mg total) by mouth 2 (two) times daily. 60 tablet 0   senna (SENOKOT) 8.6 MG tablet Take 2 tablets by mouth 2 (two) times daily.     STIMULANT LAXATIVE 8.6-50 MG tablet Take 1 tablet by mouth at bedtime.     OZEMPIC, 1 MG/DOSE, 4 MG/3ML SOPN Inject 1 mg into the skin once a week. On Monday     potassium chloride (KLOR-CON) 20 MEQ packet Take 40 mEq by mouth 2 (two) times daily. (Patient not taking: Reported on 04/25/2023)     Silver (AQUACEL AG FOAM) 6"X6" PADS Apply 1 each topically daily in the afternoon. 30 each 1   Wound Dressings (FOAM DRESSING CIRCULAR BORDER) PADS Apply 1 each topically every 3 (three) days. 30 each 1     Abtx:  Anti-infectives (From admission, onward)    Start     Dose/Rate Route Frequency Ordered Stop   04/25/23 2300  vancomycin (VANCOREADY) IVPB 1250 mg/250 mL  Status:  Discontinued        1,250 mg 166.7 mL/hr over 90 Minutes Intravenous Every 24 hours 04/25/23 0228 04/25/23 0904   04/25/23 1200  vancomycin (VANCOCIN) IVPB 1000 mg/200 mL premix        1,000 mg 200 mL/hr over 60 Minutes Intravenous Every 12 hours 04/25/23 0904     04/25/23 0230  nirmatrelvir/ritonavir (PAXLOVID) 3 tablet  3 tablet Oral 2 times daily 04/25/23 0219 04/29/23 2159   04/25/23 0215  vancomycin (VANCOCIN) IVPB 1000 mg/200 mL premix        1,000 mg 200 mL/hr over 60 Minutes Intravenous  Once 04/25/23 0209 04/25/23 0332   04/24/23 2000  ceFEPIme (MAXIPIME) 2 g in sodium chloride 0.9 % 100 mL IVPB        2 g 200 mL/hr over 30 Minutes Intravenous  Once 04/24/23 1955 04/24/23 2226   04/24/23 2000  metroNIDAZOLE (FLAGYL) IVPB 500 mg        500 mg 100 mL/hr over 60 Minutes Intravenous  Once 04/24/23 1955  04/24/23 2349   04/24/23 2000  vancomycin (VANCOCIN) IVPB 1000 mg/200 mL premix        1,000 mg 200 mL/hr over 60 Minutes Intravenous  Once 04/24/23 1955 04/25/23 0013       REVIEW OF SYSTEMS:  Const: negative fever, negative chills, negative weight loss Eyes: negative diplopia or visual changes, negative eye pain ENT: negative coryza, negative sore throat Resp: negative cough, hemoptysis, dyspnea Cards: negative for chest pain, palpitations, lower extremity edema GU: negative for frequency, dysuria and hematuria GI: Negative for abdominal pain, diarrhea, bleeding, constipation Skin: negative for rash and pruritus Heme: negative for easy bruising and gum/nose bleeding MS: negative for myalgias, arthralgias, back pain and muscle weakness Neurolo:negative for headaches, dizziness, vertigo, memory problems  Psych: negative for feelings of anxiety, depression  Endocrine: negative for thyroid, diabetes Allergy/Immunology- negative for any medication or food allergies ? Pertinent Positives include : Objective:  VITALS:  BP 116/66   Pulse 96   Temp 99.6 F (37.6 C) (Oral)   Resp 18   Ht 5\' 4"  (1.626 m)   Wt 85.3 kg   SpO2 90%   BMI 32.27 kg/m   PHYSICAL EXAM:  General: Alert, cooperative, no distress, appears stated age.  Head: Normocephalic, without obvious abnormality, atraumatic. Eyes: Conjunctivae clear, anicteric sclerae. Pupils are equal ENT did not examine as she has a mask Neck:  symmetrical, no adenopathy, thyroid: non tender no carotid bruit and no JVD. Back: No CVA tenderness. Lungs: b/l air entry Heart: s1s2 Abdomen: Soft, ileal conduit Extremities: edema feet Skin: small gluteal/sacral wound- clean, no surrounding erythema- small opening Lymph: Cervical, supraclavicular normal. Neurologic: paraplegia Left upper extremity paralysis Pertinent Labs Lab Results CBC    Component Value Date/Time   WBC 11.3 (H) 04/25/2023 0317   RBC 4.17 04/25/2023 0317    HGB 12.6 04/25/2023 0317   HGB 12.6 10/05/2014 2100   HCT 38.0 04/25/2023 0317   HCT 37.7 10/05/2014 2100   PLT 366 04/25/2023 0317   PLT 272 10/05/2014 2100   MCV 91.1 04/25/2023 0317   MCV 92 10/05/2014 2100   MCH 30.2 04/25/2023 0317   MCHC 33.2 04/25/2023 0317   RDW 13.6 04/25/2023 0317   RDW 13.8 10/05/2014 2100   LYMPHSABS 1.6 04/24/2023 2033   LYMPHSABS 2.7 09/18/2012 0415   MONOABS 0.8 04/24/2023 2033   MONOABS 0.9 09/18/2012 0415   EOSABS 0.1 04/24/2023 2033   EOSABS 0.1 09/18/2012 0415   BASOSABS 0.1 04/24/2023 2033   BASOSABS 0.0 09/18/2012 0415       Latest Ref Rng & Units 04/25/2023    3:17 AM 04/24/2023    8:33 PM 03/16/2023    5:13 AM  CMP  Glucose 70 - 99 mg/dL 161  94  98   BUN 6 - 20 mg/dL 11  16  10    Creatinine 0.44 -  1.00 mg/dL 9.81  1.91  4.78   Sodium 135 - 145 mmol/L 137  135  139   Potassium 3.5 - 5.1 mmol/L 3.7  3.1  3.3   Chloride 98 - 111 mmol/L 103  101  106   CO2 22 - 32 mmol/L 25  23  25    Calcium 8.9 - 10.3 mg/dL 8.9  9.1  8.8   Total Protein 6.5 - 8.1 g/dL  8.7    Total Bilirubin 0.3 - 1.2 mg/dL  0.8    Alkaline Phos 38 - 126 U/L  56    AST 15 - 41 U/L  15    ALT 0 - 44 U/L  11        Microbiology: Recent Results (from the past 240 hour(s))  Resp panel by RT-PCR (RSV, Flu A&B, Covid) Abscess     Status: Abnormal   Collection Time: 04/24/23  9:09 PM   Specimen: Abscess; Nasal Swab  Result Value Ref Range Status   SARS Coronavirus 2 by RT PCR POSITIVE (A) NEGATIVE Final    Comment: (NOTE) SARS-CoV-2 target nucleic acids are DETECTED.  The SARS-CoV-2 RNA is generally detectable in upper respiratory specimens during the acute phase of infection. Positive results are indicative of the presence of the identified virus, but do not rule out bacterial infection or co-infection with other pathogens not detected by the test. Clinical correlation with patient history and other diagnostic information is necessary to determine  patient infection status. The expected result is Negative.  Fact Sheet for Patients: BloggerCourse.com  Fact Sheet for Healthcare Providers: SeriousBroker.it  This test is not yet approved or cleared by the [place] FDA and  has been authorized for detection and/or diagnosis of SARS-CoV-2 by FDA under an Emergency Use Authorization (EUA).  This EUA will remain in effect (meaning this test can be used) for the duration of  the COVID-19 declaration under Section 564(b)(1) of the A ct, 21 U.S.C. section 360bbb-3(b)(1), unless the authorization is terminated or revoked sooner.     Influenza A by PCR NEGATIVE NEGATIVE Final   Influenza B by PCR NEGATIVE NEGATIVE Final    Comment: (NOTE) The Xpert Xpress SARS-CoV-2/FLU/RSV plus assay is intended as an aid in the diagnosis of influenza from Nasopharyngeal swab specimens and should not be used as a sole basis for treatment. Nasal washings and aspirates are unacceptable for Xpert Xpress SARS-CoV-2/FLU/RSV testing.  Fact Sheet for Patients: BloggerCourse.com  Fact Sheet for Healthcare Providers: SeriousBroker.it  This test is not yet approved or cleared by the [place] FDA and has been authorized for detection and/or diagnosis of SARS-CoV-2 by FDA under an Emergency Use Authorization (EUA). This EUA will remain in effect (meaning this test can be used) for the duration of the COVID-19 declaration under Section 564(b)(1) of the Act, 21 U.S.C. section 360bbb-3(b)(1), unless the authorization is terminated or revoked.     Resp Syncytial Virus by PCR NEGATIVE NEGATIVE Final    Comment: (NOTE) Fact Sheet for Patients: BloggerCourse.com  Fact Sheet for Healthcare Providers: SeriousBroker.it  This test is not yet approved or cleared by the [place] FDA and has been  authorized for detection and/or diagnosis of SARS-CoV-2 by FDA under an Emergency Use Authorization (EUA). This EUA will remain in effect (meaning this test can be used) for the duration of the COVID-19 declaration under Section 564(b)(1) of the Act, 21 U.S.C. section 360bbb-3(b)(1), unless the authorization is terminated or revoked.  Performed at [facility], [address] [place], [place] [address]   Culture, blood (Routine x 2)     Status: None (Preliminary result)   Collection Time: 04/24/23  9:30 PM   Specimen: BLOOD  Result Value Ref Range Status   Specimen Description BLOOD BLOOD LEFT ARM  Final   Special Requests   Final    BOTTLES DRAWN AEROBIC AND ANAEROBIC Blood Culture results may not be optimal due to an inadequate volume of blood received in culture bottles   Culture   Final    NO GROWTH < 12 HOURS Performed at [facility], [address] [place], [place] [address]    Report Status PENDING  Incomplete  Aerobic/Anaerobic Culture w Gram Stain (surgical/deep wound)     Status: None (Preliminary result)   Collection Time: 04/24/23 11:35 PM   Specimen: Abscess  Result Value Ref Range Status   Specimen Description   Final    ABSCESS Performed at [facility], [address], [place], [place] [address]    Special Requests   Final    NONE Performed at [facility], [address], [place], [place] [address]    Gram Stain   Final    RARE WBC PRESENT, PREDOMINANTLY PMN FEW GRAM POSITIVE COCCI Performed at [facility], [address] [place], [place] [address]    Culture PENDING  Incomplete   Report Status PENDING  Incomplete  Culture, blood (Routine x 2)     Status: None (Preliminary result)   Collection Time: 04/25/23  2:44 AM   Specimen: BLOOD  Result Value Ref Range Status   Specimen Description BLOOD LEFT HAND  Final   Special Requests   Final    BOTTLES DRAWN AEROBIC AND ANAEROBIC Blood Culture adequate  volume   Culture   Final    NO GROWTH < 12 HOURS Performed at [facility], [address] [place], [place] [address]    Report Status PENDING  Incomplete    IMAGING RESULTS:  I have personally reviewed the films ?spine hardware L5-s1  Impression/Recommendation ?52 y.o.female  with a history of MS, paraplegia, neurogenic bladder s/p ileal conduit , left medial gluteal  decubitus presented from home with fever and increased drainage from the sacral wound-  Fever- secondary to COVID On paxlovid  Left medial gluteal pressure ulcer- clean , very small and does not look overtly infected- I did not see any drainage on the dressing or when I pressed it Would recommend surgical opinion   I do not think this was the cause of her fever She will follow with wound clinic and continue topical care She does not need IV antibiotic  Multiple sclerosis  Paraplegia  Neurogenic bladder- ileal conduit  HTN  Discussed the management with patient and care team   ? ? ___________________________________________________ Discussed with patient, requesting provider Note:  This document was prepared using Dragon voice recognition software and may include unintentional dictation errors.

## 2023-04-25 NOTE — Hospital Course (Signed)
HPI on admission 04/24/23 per H&P ---  "[name] is a 52 y.o. female with medical history significant of multiple sclerosis, bedbound status, paraparesis/paraplegia, left upper extremity and marked weakness.  Patient also has sensory deficit below the waist.  Patient was in her usual state of health till approximately 2 weeks ago when she seems to have developed a new ulcer/abscess of the left gluteal area below the ischium.  Patient has been seeing wound care, however for the last for 5 days there has been increased discharge from the site.  Furthermore for the last 2 days patient has felt to be febrile at home up to 101 F.  Patient today had new onset of cough with chest pain with with coughing, otherwise no chest pain.  There is no sputum production no sensation of shortness of breath.  Given the entirety of patient's symptoms, patient was asked to come to the ER by patient primary care provider. "  Pt tested positive for Covid-19.  Was started on Paxlovid. Started on steroids 7/30 due to O2 sats 86% on room air. No pneumonia seen on chest x-ray.  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  Patient was admitted and started on empiric IV antibiotics. Abscess cultures were obtained on admission, gram strain growing GPC's.  IV vanomycin is continued.    Consults -  Infectious Disease

## 2023-04-25 NOTE — Consult Note (Signed)
WOC Nurse Consult Note: this patient is familiar to [facility] team from previous admissions; has chronic Stage 3 L buttock last seen by Wound Care Clinic 7/23 and they are utilizing Hydrofera Blue.  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  Cover with silicone foam or ABD pad whichever is preferred. May lift silicone daily to replace Silver.  Change foam dressing q3 days and prn soiling.   Patient should resume Wound Care Clinic dressing orders when discharged from hospital.   Patient should remain on low air loss mattress throughout hospitalization for pressure redistribution and moisture management.   WOC Nurse ostomy consult note; patient has had urostomy since 2013 placed at [facility]  Stoma type/location: urostomy  Stomal assessment/size: 1 1/2 inches above skin level per previous note D. Sylvie Farrier 03/16/2023 Peristomal assessment: unable to assess  Treatment options for stomal/peristomal skin: 2" barrier ring  Output per nursing flowsheet  Ostomy pouching: 2 piece 2 1/4" skin barrier Hart [facility] #[id number] and urostomy pouch Hart [facility] [phone] 2" barrier ring Hart [facility] [phone]  Education provided: none, patient with longstanding urostomy  Enrolled patient in [employer] DC program: no, longstanding urostomy   Secure chat sent to bedside nurse regarding avoe.  WOC team will not follow at this time.  Re-consult if further needs arise.   Thank you,     Priscella Mann MSN, RN-BC, [employer] [phone]

## 2023-04-25 NOTE — ED Notes (Signed)
Pt repositioned in bed with pressure relief provided to L buttock. Pt sitting upright. Pillow placed under L arm. Blankets minimal due to fever. Urostomy bag emptied. Approximately output.

## 2023-04-25 NOTE — ED Notes (Signed)
Family updated as to patient's status.

## 2023-04-25 NOTE — ED Notes (Signed)
Lab at bedside attempting to obtain second set of blood cultures

## 2023-04-25 NOTE — ED Notes (Signed)
Pt awake and willing to eat some of her dinner tray. Pt finished dinner tray and calling family to update them.

## 2023-04-25 NOTE — Progress Notes (Signed)
Pharmacy Antibiotic Note  [name] is a 52 y.o. female admitted on 04/24/2023 with  wound infection .  Pharmacy has been consulted for Vancomycin dosing.  Plan: Vancomycin 1 gm IV X 1 given in ED on 7/29 @ 2302.  Additional Vanc 1 gm IV X 1 ordered to make total loading dose of 2 gm.  Vancomycin 1250 mg IV Q24H ordered to start on 7/30 @ 2300.  AUC = 467.5 Vanc trough = 9.2   Height: 5\' 4"  (162.6 cm) Weight: 85.3 kg (188 lb) IBW/kg (Calculated) : 54.7  Temp (24hrs), Avg:99.1 F (37.3 C), Min:98.6 F (37 C), Max:99.6 F (37.6 C)  Recent Labs  Lab 04/24/23 2033 04/24/23 2130  WBC 11.9*  --   CREATININE 0.53  --   LATICACIDVEN  --  0.6    Estimated Creatinine Clearance: 85.9 mL/min (by C-G formula based on SCr of 0.53 mg/dL).    Allergies  Allergen Reactions   Latex Swelling and Other (See Comments)    Reaction:  Facial swelling     Antimicrobials this admission:   >>    >>   Dose adjustments this admission:   Microbiology results:  BCx:   UCx:    Sputum:    MRSA PCR:   Thank you for allowing pharmacy to be a part of this patient's care.  , D 04/25/2023 2:28 AM

## 2023-04-25 NOTE — ED Notes (Signed)
2nd attempt to call family to update at [phone] with no answer or option to leave voicemail.

## 2023-04-25 NOTE — ED Notes (Signed)
Attempted to call family to provide update. No answer and no voicemail option.

## 2023-04-26 ENCOUNTER — Inpatient Hospital Stay: Payer: 59

## 2023-04-26 ENCOUNTER — Inpatient Hospital Stay: Admit: 2023-04-26 | Discharge: 2023-04-28 | Payer: MEDICARE

## 2023-04-26 DIAGNOSIS — G35 Multiple sclerosis: Principal | ICD-10-CM

## 2023-04-26 DIAGNOSIS — U071 COVID-19: Secondary | ICD-10-CM

## 2023-04-26 DIAGNOSIS — L0231 Cutaneous abscess of buttock: Secondary | ICD-10-CM | POA: Diagnosis not present

## 2023-04-26 DIAGNOSIS — Z9889 Other specified postprocedural states: Secondary | ICD-10-CM

## 2023-04-26 DIAGNOSIS — G8220 Paraplegia, unspecified: Secondary | ICD-10-CM

## 2023-04-26 DIAGNOSIS — L89329 Pressure ulcer of left buttock, unspecified stage: Secondary | ICD-10-CM

## 2023-04-26 LAB — GLUCOSE, CAPILLARY
Glucose-Capillary: 142 mg/dL — ABNORMAL HIGH (ref 70–99)
Glucose-Capillary: 190 mg/dL — ABNORMAL HIGH (ref 70–99)
Glucose-Capillary: 247 mg/dL — ABNORMAL HIGH (ref 70–99)

## 2023-04-26 NOTE — Progress Notes (Addendum)
Progress Note    [name]  XWR:[id number] DOB: 1971/04/13  DOA: 04/24/2023 PCP: [name], MD      Brief Narrative:    Medical records reviewed and are as summarized below:  [name] is a 52 y.o. female  with medical history significant of multiple sclerosis, bedbound status, paraplegia, left upper extremity weakness, s/p urostomy, who presented to the hospital because of a wound on her left buttock that started about 2 weeks prior to admission.  She had been receiving local wound care but she noticed increasing drainage from the left buttock wound.  She developed a fever with temperature up to 101 F at home about 2 days prior to admission.  She also complained of congested cough.  She tested positive for COVID-19 infection and she was started on Paxlovid.  She was started on IV steroids in the emergency department because she was hypoxic (86%) on room air.  CT abdomen pelvis showed left gluteal abscess (2.5 x 1.5 cm) and possible early osteomyelitis of the left ischium.  She was treated with empiric IV antibiotics.    Assessment/Plan:   Principal Problem:   Left buttock abscess Active Problems:   COVID-19 virus infection   Multiple sclerosis (HCC)   Obesity (BMI 30-39.9)   Neuropathic pain   Paraplegia (HCC)   Primary hypertension   Decubitus ulcer of sacral region, stage 3 (HCC)   History of ileal conduit   Body mass index is 32.27 kg/m.   Sepsis secondary to COVID-19 infection and left buttock abscess, possible early osteomyelitis of the left ischium: Gram stain from the left buttock wound showed few gram-positive cocci.  Wound and blood culture cultures.  Continue empiric IV Vanco 7.  Follow-up with ID specialist. Continue Paxlovid. She is on IV dexamethasone. Continue contact and labor precautions.   Acute hypoxic respiratory failure: She was on 3 L/min oxygen via [place] this morning.  She is down to 1 L/min oxygen via [place].  Wean off oxygen as  able. No acute abnormality on chest x-ray.   Hypokalemia: Improved   Multiple sclerosis, paraplegia, neuropathic pain: Continue baclofen, gabapentin and Cymbalta.   History of ileal conduit, hypertension     Diet Order             Diet regular Room service appropriate? Yes; Fluid consistency: Thin  Diet effective now                            Consultants: ID specialist  Procedures: None    Medications:    amantadine  200 mg Oral BID   baclofen  20 mg Oral QID   cholecalciferol  1,000 Units Oral Daily   dexamethasone (DECADRON) injection  6 mg Intravenous Q24H   DULoxetine  60 mg Oral Daily   enoxaparin (LOVENOX) injection  0.5 mg/kg Subcutaneous Q24H   gabapentin  200 mg Oral QID   guaiFENesin  600 mg Oral BID   insulin aspart  0-15 Units Subcutaneous TID WC   insulin aspart  0-5 Units Subcutaneous QHS   nirmatrelvir/ritonavir  3 tablet Oral BID   pantoprazole  40 mg Oral BID   potassium chloride  40 mEq Oral Daily   senna-docusate  2 tablet Oral BID   sodium chloride flush  3 mL Intravenous Q12H   Continuous Infusions:  vancomycin 1,000 mg (04/26/23 1043)     Anti-infectives (From admission, onward)    Start  Dose/Rate Route Frequency Ordered Stop   04/25/23 2300  vancomycin (VANCOREADY) IVPB 1250 mg/250 mL  Status:  Discontinued        1,250 mg 166.7 mL/hr over 90 Minutes Intravenous Every 24 hours 04/25/23 0228 04/25/23 0904   04/25/23 1200  vancomycin (VANCOCIN) IVPB 1000 mg/200 mL premix        1,000 mg 200 mL/hr over 60 Minutes Intravenous Every 12 hours 04/25/23 0904     04/25/23 0230  nirmatrelvir/ritonavir (PAXLOVID) 3 tablet        3 tablet Oral 2 times daily 04/25/23 0219 04/29/23 2159   04/25/23 0215  vancomycin (VANCOCIN) IVPB 1000 mg/200 mL premix        1,000 mg 200 mL/hr over 60 Minutes Intravenous  Once 04/25/23 0209 04/25/23 0332   04/24/23 2000  ceFEPIme (MAXIPIME) 2 g in sodium chloride 0.9 % 100 mL IVPB         2 g 200 mL/hr over 30 Minutes Intravenous  Once 04/24/23 1955 04/24/23 2226   04/24/23 2000  metroNIDAZOLE (FLAGYL) IVPB 500 mg        500 mg 100 mL/hr over 60 Minutes Intravenous  Once 04/24/23 1955 04/24/23 2349   04/24/23 2000  vancomycin (VANCOCIN) IVPB 1000 mg/200 mL premix        1,000 mg 200 mL/hr over 60 Minutes Intravenous  Once 04/24/23 1955 04/25/23 0013              Family Communication/Anticipated D/C date and plan/Code Status   DVT prophylaxis: SCDs Start: 04/24/23 2351     Code Status: Full Code  Family Communication: None Disposition Plan: Plan to discharge to group home   Status is: Inpatient Remains inpatient appropriate because: Left gluteal abscess, COVID-19 infection       Subjective:   Interval events noted.  She complains of cough.  She also complains of drainage from left buttock wound.  Objective:    Vitals:   04/26/23 0548 04/26/23 0600 04/26/23 0950 04/26/23 1100  BP:  (!) 145/94 122/80   Pulse:  86 86   Resp:  (!) 23 17   Temp: 98.7 F (37.1 C)  98.3 F (36.8 C)   TempSrc: Oral  Oral   SpO2:  94% 100% 94%  Weight:      Height:       No data found.   Intake/Output Summary (Last 24 hours) at 04/26/2023 1445 Last data filed at 04/26/2023 1300 Gross per 24 hour  Intake 460 ml  Output 1525 ml  Net -1065 ml   Filed Weights   04/24/23 1841  Weight: 85.3 kg    Exam:  GEN: NAD SKIN: Warm and dry.  Stage III left buttock wound EYES: No pallor or icterus ENT: MMM CV: RRR PULM: CTA B ABD: soft, ND, NT, +BS, ileal conduit in RLQ CNS: AAO x 3, paraplegic EXT: No edema or tenderness     Data Reviewed:   I have personally reviewed following labs and imaging studies:  Labs: Labs show the following:   Basic Metabolic Panel: Recent Labs  Lab 04/24/23 2033 04/25/23 0317  NA 135 137  K 3.1* 3.7  CL 101 103  CO2 23 25  GLUCOSE 94 133*  BUN 16 11  CREATININE 0.53 0.48  CALCIUM 9.1 8.9   GFR Estimated  Creatinine Clearance: 85.9 mL/min (by C-G formula based on SCr of 0.48 mg/dL). Liver Function Tests: Recent Labs  Lab 04/24/23 2033  AST 15  ALT 11  ALKPHOS 56  BILITOT 0.8  PROT 8.7*  ALBUMIN 3.9   No results for input(s): "LIPASE", "AMYLASE" in the last 168 hours. No results for input(s): "AMMONIA" in the last 168 hours. Coagulation profile Recent Labs  Lab 04/25/23 0317  INR 1.3*    CBC: Recent Labs  Lab 04/24/23 2033 04/25/23 0317 04/26/23 0512  WBC 11.9* 11.3* 9.4  NEUTROABS 9.3*  --   --   HGB 13.4 12.6 11.5*  HCT 44.0 38.0 35.1*  MCV 96.7 91.1 89.3  PLT 392 366 336   Cardiac Enzymes: No results for input(s): "CKTOTAL", "CKMB", "CKMBINDEX", "TROPONINI" in the last 168 hours. BNP (last 3 results) No results for input(s): "PROBNP" in the last 8760 hours. CBG: Recent Labs  Lab 04/25/23 1016 04/25/23 1151 04/25/23 1656 04/25/23 2225 04/26/23 1202  GLUCAP 130* 117* 138* 133* 142*   D-Dimer: No results for input(s): "DDIMER" in the last 72 hours. Hgb A1c: Recent Labs    04/25/23 0100  HGBA1C 5.0   Lipid Profile: No results for input(s): "CHOL", "HDL", "LDLCALC", "TRIG", "CHOLHDL", "LDLDIRECT" in the last 72 hours. Thyroid function studies: No results for input(s): "TSH", "T4TOTAL", "T3FREE", "THYROIDAB" in the last 72 hours.  Invalid input(s): "FREET3" Anemia work up: No results for input(s): "VITAMINB12", "FOLATE", "FERRITIN", "TIBC", "IRON", "RETICCTPCT" in the last 72 hours. Sepsis Labs: Recent Labs  Lab 04/24/23 2033 04/24/23 2130 04/25/23 0317 04/26/23 0512  PROCALCITON  --   --   --  0.14  WBC 11.9*  --  11.3* 9.4  LATICACIDVEN  --  0.6  --   --     Microbiology Recent Results (from the past 240 hour(s))  Resp panel by RT-PCR (RSV, Flu A&B, Covid) Abscess     Status: Abnormal   Collection Time: 04/24/23  9:09 PM   Specimen: Abscess; Nasal Swab  Result Value Ref Range Status   SARS Coronavirus 2 by RT PCR POSITIVE (A) NEGATIVE  Final    Comment: (NOTE) SARS-CoV-2 target nucleic acids are DETECTED.  The SARS-CoV-2 RNA is generally detectable in upper respiratory specimens during the acute phase of infection. Positive results are indicative of the presence of the identified virus, but do not rule out bacterial infection or co-infection with other pathogens not detected by the test. Clinical correlation with patient history and other diagnostic information is necessary to determine patient infection status. The expected result is Negative.  Fact Sheet for Patients: BloggerCourse.com  Fact Sheet for Healthcare Providers: SeriousBroker.it  This test is not yet approved or cleared by the [place] FDA and  has been authorized for detection and/or diagnosis of SARS-CoV-2 by FDA under an Emergency Use Authorization (EUA).  This EUA will remain in effect (meaning this test can be used) for the duration of  the COVID-19 declaration under Section 564(b)(1) of the A ct, 21 U.S.C. section 360bbb-3(b)(1), unless the authorization is terminated or revoked sooner.     Influenza A by PCR NEGATIVE NEGATIVE Final   Influenza B by PCR NEGATIVE NEGATIVE Final    Comment: (NOTE) The Xpert Xpress SARS-CoV-2/FLU/RSV plus assay is intended as an aid in the diagnosis of influenza from Nasopharyngeal swab specimens and should not be used as a sole basis for treatment. Nasal washings and aspirates are unacceptable for Xpert Xpress SARS-CoV-2/FLU/RSV testing.  Fact Sheet for Patients: BloggerCourse.com  Fact Sheet for Healthcare Providers: SeriousBroker.it  This test is not yet approved or cleared by the [place] FDA and has been authorized for detection and/or diagnosis of SARS-CoV-2 by FDA  under an Emergency Use Authorization (EUA). This EUA will remain in effect (meaning this test can be used) for the duration of  the COVID-19 declaration under Section 564(b)(1) of the Act, 21 U.S.C. section 360bbb-3(b)(1), unless the authorization is terminated or revoked.     Resp Syncytial Virus by PCR NEGATIVE NEGATIVE Final    Comment: (NOTE) Fact Sheet for Patients: BloggerCourse.com  Fact Sheet for Healthcare Providers: SeriousBroker.it  This test is not yet approved or cleared by the [place] FDA and has been authorized for detection and/or diagnosis of SARS-CoV-2 by FDA under an Emergency Use Authorization (EUA). This EUA will remain in effect (meaning this test can be used) for the duration of the COVID-19 declaration under Section 564(b)(1) of the Act, 21 U.S.C. section 360bbb-3(b)(1), unless the authorization is terminated or revoked.  Performed at [facility], [address] [place], [place] [address]   Culture, blood (Routine x 2)     Status: None (Preliminary result)   Collection Time: 04/24/23  9:30 PM   Specimen: BLOOD  Result Value Ref Range Status   Specimen Description BLOOD BLOOD LEFT ARM  Final   Special Requests   Final    BOTTLES DRAWN AEROBIC AND ANAEROBIC Blood Culture results may not be optimal due to an inadequate volume of blood received in culture bottles   Culture   Final    NO GROWTH 2 DAYS Performed at [facility] [address] [place], [place] [address]    Report Status PENDING  Incomplete  Aerobic/Anaerobic Culture w Gram Stain (surgical/deep wound)     Status: None (Preliminary result)   Collection Time: 04/24/23 11:35 PM   Specimen: Abscess  Result Value Ref Range Status   Specimen Description   Final    ABSCESS Performed at [facility], [address] [place], [place] [address]    Special Requests   Final    NONE Performed at [facility], [address] [place], [place] [address]    Gram Stain   Final    RARE WBC PRESENT, PREDOMINANTLY PMN FEW GRAM  POSITIVE COCCI    Culture   Final    CULTURE REINCUBATED FOR BETTER GROWTH Performed at [facility], [address] [place], [place] [address]    Report Status PENDING  Incomplete  Culture, blood (Routine x 2)     Status: None (Preliminary result)   Collection Time: 04/25/23  2:44 AM   Specimen: BLOOD  Result Value Ref Range Status   Specimen Description BLOOD LEFT HAND  Final   Special Requests   Final    BOTTLES DRAWN AEROBIC AND ANAEROBIC Blood Culture adequate volume   Culture   Final    NO GROWTH 1 DAY Performed at [facility], [address] [place], [place] [address]    Report Status PENDING  Incomplete    Procedures and diagnostic studies:  DG Chest Port 1 View  Result Date: 04/26/2023 CLINICAL DATA:  Cough. EXAM: PORTABLE CHEST 1 VIEW COMPARISON:  Chest radiograph dated 04/24/2023. chest CT dated 12/03/2021. FINDINGS: Linear right lung base atelectasis/scarring. No new consolidation. There is no pleural effusion or pneumothorax. The cardiac silhouette is within normal limits. No acute osseous pathology. IMPRESSION: No active disease. Electronically Signed   By: Elgie Collard M.D.   On: 04/26/2023 01:08   CT ABDOMEN PELVIS W CONTRAST  Result Date: 04/25/2023 CLINICAL DATA:  Sacral wound EXAM: CT ABDOMEN AND PELVIS WITH CONTRAST TECHNIQUE: Multidetector CT imaging  of the abdomen and pelvis was performed using the standard protocol following bolus administration of intravenous contrast. RADIATION DOSE REDUCTION: This exam was performed according to the departmental dose-optimization program which includes automated exposure control, adjustment of the mA and/or kV according to patient size and/or use of iterative reconstruction technique. CONTRAST:  OMNIPAQUE IOHEXOL 300 MG/ML  SOLN COMPARISON:  CT 03/15/2023, 09/03/2021 FINDINGS: Lower chest: Lung bases demonstrate bandlike scarring and chronic consolidation at the right base. No acute airspace disease.  Hepatobiliary: No focal liver abnormality is seen. Gallstones. No biliary dilatation Pancreas: Unremarkable. No pancreatic ductal dilatation or surrounding inflammatory changes. Spleen: Normal in size without focal abnormality. Adrenals/Urinary Tract: Adrenal glands are within normal limits. Multifocal cortical scarring in the kidneys. No hydronephrosis. 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 Contrast fills the ileal conduit in the pelvis. Status post cystectomy. Stomach/Bowel: Stomach nonenlarged. No dilated small bowel. Large stool burden. No acute bowel wall thickening Vascular/Lymphatic: Moderate aortic atherosclerosis. No aneurysm. No suspicious lymph nodes. Reproductive: Hysterectomy.  No adnexal mass Other: Negative for pelvic effusion or free air. Musculoskeletal: No fracture or malalignment. Posterior spinal hardware L5-S1. Soft tissue inflammatory changes within the left medial gluteal region. Interim finding of gas and fluid collection measuring 3 x 1.6 cm near the left ischial tuberosity. There may be some loss of bony cortex, series 7, image 74. IMPRESSION: 1. Status post cystectomy with right pelvic ileal conduit. No hydronephrosis. Multifocal cortical scarring of the kidneys. 2. Soft tissue inflammatory changes in the left medial gluteal region with interim finding of gas and minimal fluid collection measuring 2.5 x 1.5 cm near the left ischial tuberosity, suspicious for small abscess. Possible mild cortical loss at the left ischium, raising possibility of early osteomyelitis. 3. Gallstones. Electronically Signed   By: Jasmine Pang M.D.   On: 04/25/2023 00:45   DG Chest Port 1 View  Result Date: 04/24/2023 CLINICAL DATA:  Fever, sacral wound EXAM: PORTABLE CHEST 1 VIEW COMPARISON:  03/15/2023 FINDINGS: Single frontal view of the chest demonstrates an unremarkable cardiac silhouette. Minimal hypoventilatory changes at the lung bases. No  acute airspace disease, effusion, or pneumothorax. No acute bony abnormality. IMPRESSION: 1. No acute intrathoracic process. Electronically Signed   By: Sharlet Salina M.D.   On: 04/24/2023 20:30               LOS: 2 days      [facility]   Pager on [email or web address]. If 7PM-7AM, please contact night-coverage at [email or web address]     04/26/2023, 2:45 PM

## 2023-04-26 NOTE — Progress Notes (Signed)
Patient admitted from ER in stable condition. Patient w/c bound at baseline. Patient reports living at home with her mother, but has aide services as well as other family support for assistance.

## 2023-04-26 NOTE — ED Notes (Signed)
Pt repositioned in bed.

## 2023-04-27 DIAGNOSIS — L0231 Cutaneous abscess of buttock: Secondary | ICD-10-CM | POA: Diagnosis not present

## 2023-04-27 DIAGNOSIS — G8220 Paraplegia, unspecified: Secondary | ICD-10-CM | POA: Diagnosis not present

## 2023-04-27 DIAGNOSIS — U071 COVID-19: Secondary | ICD-10-CM | POA: Diagnosis not present

## 2023-04-27 DIAGNOSIS — L89153 Pressure ulcer of sacral region, stage 3: Secondary | ICD-10-CM | POA: Diagnosis not present

## 2023-04-27 DIAGNOSIS — G35 Multiple sclerosis: Secondary | ICD-10-CM | POA: Diagnosis not present

## 2023-04-27 LAB — AEROBIC/ANAEROBIC CULTURE W GRAM STAIN (SURGICAL/DEEP WOUND): Gram Stain: NONE SEEN

## 2023-04-27 LAB — GLUCOSE, CAPILLARY
Glucose-Capillary: 116 mg/dL — ABNORMAL HIGH (ref 70–99)
Glucose-Capillary: 124 mg/dL — ABNORMAL HIGH (ref 70–99)
Glucose-Capillary: 135 mg/dL — ABNORMAL HIGH (ref 70–99)
Glucose-Capillary: 137 mg/dL — ABNORMAL HIGH (ref 70–99)

## 2023-04-27 MED ORDER — LIDOCAINE-EPINEPHRINE 1 %-1:100000 IJ SOLN
20.0000 mL | Freq: Once | INTRAMUSCULAR | Status: AC
  Administered 2023-04-27: 20 mL via INTRADERMAL
  Filled 2023-04-27: qty 20

## 2023-04-27 MED ORDER — MORPHINE SULFATE (PF) 2 MG/ML IV SOLN: 2.0000 mg | INTRAVENOUS | Status: DC | PRN

## 2023-04-27 MED ORDER — SODIUM CHLORIDE 0.9 % IV SOLN
2.0000 g | INTRAVENOUS | Status: DC
  Administered 2023-04-27 – 2023-04-30 (×4): 2 g via INTRAVENOUS
  Filled 2023-04-27 (×5): qty 20

## 2023-04-27 MED ORDER — METRONIDAZOLE 500 MG PO TABS
500.0000 mg | ORAL_TABLET | Freq: Two times a day (BID) | ORAL | Status: DC
  Administered 2023-04-27 – 2023-05-05 (×17): 500 mg via ORAL
  Filled 2023-04-27 (×17): qty 1

## 2023-04-27 MED ORDER — VANCOMYCIN HCL 1500 MG/300ML IV SOLN
1500.0000 mg | INTRAVENOUS | Status: DC
  Administered 2023-04-28: 1500 mg via INTRAVENOUS
  Filled 2023-04-27: qty 300

## 2023-04-27 MED ORDER — ACETAMINOPHEN 500 MG PO TABS
1000.0000 mg | ORAL_TABLET | Freq: Four times a day (QID) | ORAL | Status: DC | PRN
  Administered 2023-04-30: 1000 mg via ORAL
  Filled 2023-04-27: qty 2

## 2023-04-27 MED ORDER — OXYCODONE HCL 5 MG PO TABS
5.0000 mg | ORAL_TABLET | ORAL | Status: DC | PRN
  Administered 2023-04-28 – 2023-05-04 (×5): 5 mg via ORAL
  Filled 2023-04-27 (×5): qty 1

## 2023-04-27 NOTE — Progress Notes (Signed)
I&D completed at bedside by Dr. Aleen Campi and Lincoln Brigham, PA-C. Patient tolerated well.

## 2023-04-27 NOTE — Progress Notes (Signed)
Dressing assessed s/p I&D. No signs of active bleeding, dressing clean, dry and intact.

## 2023-04-27 NOTE — Care Management Important Message (Signed)
Important Message  Patient Details  Name: [name] MRN: [id number] Date of Birth: [birth year]   Medicare Important Message Given:  N/A - LOS <3 / Initial given by admissions     Olegario Messier A  04/27/2023, 8:05 AM

## 2023-04-27 NOTE — Progress Notes (Signed)
Date of Admission:  04/24/2023     ID: [name] is a 52 y.o. female  Principal Problem:   Left buttock abscess Active Problems:   Neuropathic pain   Multiple sclerosis (HCC)   Obesity (BMI 30-39.9)   Paraplegia (HCC)   Primary hypertension   Decubitus ulcer of sacral region, stage 3 (HCC)   COVID-19 virus infection   History of ileal conduit    Subjective: Still has cough But feeling a little better Had I&D of the abscess on her sacral area Medications:   amantadine  200 mg Oral BID   baclofen  20 mg Oral QID   cholecalciferol  1,000 Units Oral Daily   dexamethasone (DECADRON) injection  6 mg Intravenous Q24H   DULoxetine  60 mg Oral Daily   enoxaparin (LOVENOX) injection  0.5 mg/kg Subcutaneous Q24H   gabapentin  200 mg Oral QID   guaiFENesin  600 mg Oral BID   insulin aspart  0-15 Units Subcutaneous TID WC   insulin aspart  0-5 Units Subcutaneous QHS   metroNIDAZOLE  500 mg Oral Q12H   nirmatrelvir/ritonavir  3 tablet Oral BID   pantoprazole  40 mg Oral BID   potassium chloride  40 mEq Oral Daily   senna-docusate  2 tablet Oral BID   sodium chloride flush  3 mL Intravenous Q12H    Objective: Vital signs in last 24 hours: Patient Vitals for the past 24 hrs:  BP Temp Temp src Pulse Resp SpO2  04/27/23 1153 -- -- -- -- -- 97 %  04/27/23 0859 128/80 98 F (36.7 C) Oral 80 18 94 %  04/27/23 0450 (!) 151/89 (!) 97.5 F (36.4 C) Oral 81 16 96 %  04/26/23 2021 133/68 98.2 F (36.8 C) Oral 97 18 96 %  04/26/23 1647 115/60 98.8 F (37.1 C) Oral 91 18 99 %      PHYSICAL EXAM:  General: Alert, cooperative, no distress, appears stated age.  Lungs: Clear to auscultation bilaterally. No Wheezing or Rhonchi. No rales. Heart: Regular rate and rhythm, no murmur, rub or gallop. Abdomen: Soft, non-tender,not distended.  Ileal conduit  extremities: Edema legs Skin: No rashes or lesions. Or bruising Lymph: Cervical, supraclavicular normal. Neurologic:  Paraplegia    Lab Results    Latest Ref Rng & Units 04/26/2023    5:12 AM 04/25/2023    3:17 AM 04/24/2023    8:33 PM  CBC  WBC 4.0 - 10.5 K/uL 9.4  11.3  11.9   Hemoglobin 12.0 - 15.0 g/dL 40.9  81.1  91.4   Hematocrit 36.0 - 46.0 % 35.1  38.0  44.0   Platelets 150 - 400 K/uL 336  366  392        Latest Ref Rng & Units 04/27/2023   10:48 AM 04/25/2023    3:17 AM 04/24/2023    8:33 PM  CMP  Glucose 70 - 99 mg/dL  782  94   BUN 6 - 20 mg/dL  11  16   Creatinine 9.56 - 1.00 mg/dL 2.13  0.86  5.78   Sodium 135 - 145 mmol/L  137  135   Potassium 3.5 - 5.1 mmol/L  3.7  3.1   Chloride 98 - 111 mmol/L  103  101   CO2 22 - 32 mmol/L  25  23   Calcium 8.9 - 10.3 mg/dL  8.9  9.1   Total Protein 6.5 - 8.1 g/dL   8.7   Total Bilirubin 0.3 -  1.2 mg/dL   0.8   Alkaline Phos 38 - 126 U/L   56   AST 15 - 41 U/L   15   ALT 0 - 44 U/L   11       Microbiology: Blood culture no growth Abscess culture pending Studies/Results: DG Chest Port 1 View  Result Date: 04/26/2023 CLINICAL DATA:  Cough. EXAM: PORTABLE CHEST 1 VIEW COMPARISON:  Chest radiograph dated 04/24/2023. chest CT dated 12/03/2021. FINDINGS: Linear right lung base atelectasis/scarring. No new consolidation. There is no pleural effusion or pneumothorax. The cardiac silhouette is within normal limits. No acute osseous pathology. IMPRESSION: No active disease. Electronically Signed   By: Elgie Collard M.D.   On: 04/26/2023 01:08     Assessment/Plan: 52 y.o.female  with a history of MS, paraplegia, neurogenic bladder s/p ileal conduit , left medial gluteal  decubitus presented from home with fever and increased drainage from the sacral wound-   Fever- secondary to COVID On paxlovid   Left medial gluteal pressure ulcer- surgery saw her and did I/D to make the opening larger so that the underlying abscess could drain  Gram neg rod in culture- will add ceftriaxone, continue vancomycin and Flagyl very likely on discharge will be  able to switch to p.o. antibiotic Multiple sclerosis  Paraplegia   Neurogenic bladder- ileal conduit   HTN   Discussed the management with patient and care team

## 2023-04-27 NOTE — Consult Note (Signed)
Date of Consultation:  04/27/2023  Requesting Physician:  Lurene Shadow, MD  Reason for Consultation:  Left gluteal abscess  History of Present Illness: [name] is a 52 y.o. female admitted on 7/29 with left gluteal wound concerns and also with fevers at home.  She was found to have COVID infection and is currently on paxlovid.  She had a CT scan of the abdomen and pelvis which showed a small deep left gluteal abscess.  The patient has been reporting some increased pain in the area and drainage.  WOC RN has evaluated the patient as well as Dr. Rivka Safer with ID and general surgery consult was requested to evaluate the wound and abscess.  She has a history of MS and is mostly wheelchair bound, and has a chronic left gluteal wound that is followed at the wound clinic.  Currently the patient is afebrile, her WBC is normalized, but her CRP is elevated.  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 Authorizing Provider  acetaminophen (TYLENOL) 500 MG tablet Take 500 mg by mouth every 6 (six) hours as needed.   Yes [provider]  amantadine (SYMMETREL) 100 MG capsule Take 200 mg by mouth 2 (two) times daily.   Yes [provider]  baclofen (LIORESAL) 20 MG tablet Take 40 mg by mouth 2 (two) times daily.   Yes [provider]  cholecalciferol (VITAMIN D) 1000  units tablet Take 1,000 Units by mouth daily.   Yes [provider]  Cranberry 500 MG CHEW Chew 500 mg by mouth daily.   Yes [provider]  DULoxetine (CYMBALTA) 60 MG capsule Take 60 mg by mouth daily. 01/09/23 01/04/24 Yes [provider]  gabapentin (NEURONTIN) 100 MG capsule Take 200 mg by mouth every morning. 06/17/21  Yes [provider]  gabapentin (NEURONTIN) 800 MG tablet Take 800 mg by mouth at bedtime. 07/08/21  Yes [provider]  gentamicin cream (GARAMYCIN) 0.1 % Apply 1 Application topically daily. 04/11/23  Yes [provider]  lactulose (CHRONULAC) 10 GM/15ML solution Take 15 mLs by mouth daily as needed for mild constipation. 04/10/23  Yes [provider]  pantoprazole (PROTONIX) 40 MG tablet Take 1 tablet (40 mg total) by mouth 2 (two) times daily. 01/14/23  Yes Sunnie Nielsen, DO  senna (SENOKOT) 8.6 MG tablet Take 2 tablets by mouth 2 (two) times daily. 06/24/22  Yes [provider]  STIMULANT LAXATIVE 8.6-50 MG tablet Take 1 tablet by mouth at bedtime. 11/07/21  Yes [provider]  OZEMPIC, 1 MG/DOSE, 4 MG/3ML SOPN Inject 1 mg into the skin once a week. On Monday 09/02/21   [provider]  potassium chloride (KLOR-CON) 20 MEQ packet Take 40 mEq by mouth 2 (two) times daily. Patient not taking: Reported on 04/25/2023 01/09/23   [provider]  Silver (AQUACEL AG FOAM) [id number]" PADS Apply 1 each  topically daily in the afternoon. 03/16/23   Wouk, Wilfred Curtis, MD  Wound Dressings (FOAM DRESSING CIRCULAR BORDER) PADS Apply 1 each topically every 3 (three) days. 03/16/23   Wouk, Wilfred Curtis, MD    Allergies: Allergies  Allergen Reactions   Latex Swelling and Other (See Comments)    Reaction:  Facial swelling     Social History:  reports that she has never smoked. She has never used smokeless tobacco. She reports that she does not currently use alcohol. She reports that she does not  use drugs.   Family History: Family History  Problem Relation Age of Onset   Osteoarthritis Mother    Hypertension Mother    Hyperlipidemia Mother    Thyroid disease Mother    Liver cancer Father    Asthma Brother     Review of Systems: Review of Systems  Constitutional:  Positive for fever. Negative for chills.  Respiratory:  Negative for shortness of breath.   Cardiovascular:  Negative for chest pain.  Gastrointestinal:  Negative for nausea and vomiting.  Skin:        Chronic left gluteal wound    Physical Exam BP (!) 151/89 (BP Location: Left Arm)   Pulse 81   Temp (!) 97.5 F (36.4 C) (Oral)   Resp 16   Ht 5\' 4"  (1.626 m)   Wt 85.3 kg   SpO2 96%   BMI 32.27 kg/m  CONSTITUTIONAL: No acute distress HEENT:  Normocephalic, atraumatic, extraocular motion intact. RESPIRATORY:  Normal respiratory effort without pathologic use of accessory muscles. CARDIOVASCULAR: Regular rhythm and rate. MUSCULOSKELETAL:  Normal muscle strength and tone in all four extremities.  No peripheral edema or cyanosis. SKIN: The patient has a 1.5 cm wound in the medial left gluteal area with surrounding chronic skin changes from a previously healing wound.  When pushing on this area, there is some purulent drainage.  The wound was probed with qtip and there was more purulent fluid that was expressed.  The cavity tracks laterally and deeply. NEUROLOGIC:  Motor and sensation is grossly normal.  Cranial nerves are grossly intact. PSYCH:  Alert and oriented to person, place and time. Affect is normal.  Laboratory Analysis: Results for orders placed or performed during the hospital encounter of 04/24/23 (from the past 24 hour(s))  Glucose, capillary     Status: Abnormal   Collection Time: 04/26/23 12:02 PM  Result Value Ref Range   Glucose-Capillary 142 (H) 70 - 99 mg/dL  Glucose, capillary     Status: Abnormal   Collection Time: 04/26/23  4:50 PM  Result Value Ref Range   Glucose-Capillary 190  (H) 70 - 99 mg/dL  Glucose, capillary     Status: Abnormal   Collection Time: 04/26/23  8:23 PM  Result Value Ref Range   Glucose-Capillary 247 (H) 70 - 99 mg/dL  Glucose, capillary     Status: Abnormal   Collection Time: 04/27/23  7:57 AM  Result Value Ref Range   Glucose-Capillary 116 (H) 70 - 99 mg/dL    Imaging: CT abdomen/pelvis on 04/24/23: IMPRESSION: 1. Status post cystectomy with right pelvic ileal conduit. No hydronephrosis. Multifocal cortical scarring of the kidneys. 2. Soft tissue inflammatory changes in the left medial gluteal region with interim finding of gas and minimal fluid collection measuring 2.5 x 1.5 cm near the left ischial tuberosity, suspicious for small abscess. Possible mild cortical loss at the left ischium, raising possibility of early osteomyelitis. 3. Gallstones.  Assessment and Plan: This is a  52 y.o. female with a left gluteal abscess.  --Discussed with the patient that she does have an abscess in the left gluteal area, with a small open wound that is draining purulent fluid.  However, discussed with her that the wound is small enough that it is difficult for the abscess to drain well.  As such, it would be best if we do a bedside I&D to make the wound bigger so it can drain better.  This would also allow for better packing of the wound with gauze and for better healing of the deeper aspect of the wound.  She's in agreement with this plan.  Discussed minor risks of bleeding, injury, need for further procedures and she's willing to proceed.   Procedure Date:  04/27/2023  Pre-operative Diagnosis:  Left gluteal abscess  Post-operative Diagnosis: Left gluteal abscess  Procedure:  Incision and Drainage of left gluteal abscess  Surgeon:  Howie Ill, MD  Assistant:  Lynden Oxford, PA-C  Anesthesia:  7 ml 1% lidocaine  Estimated Blood Loss:  5 ml  Specimens:  culture swab  Complications:  None  Indications for Procedure:  This is a 52 y.o.  female with diagnosis of a left gluteal abscess, requiring drainage procedure.  The risks of bleeding, abscess or infection, injury to surrounding structures, and need for further procedures were all discussed with the patient and was willing to proceed.  Description of Procedure: The patient was correctly identified at bedside.  Appropriate time-outs were performed prior to procedure.  The patient's left medial buttocks was prepped and draped in usual sterile fashion.  Local anesthetic was infused intradermally.  The patient already had a 1.5 cm wound that was noted on exam.  Scalpel was used to extend this wound to about 3 x 1 cm size.  Culture swab was swabbed around the abscess cavity and sent to micro.  Small Kelly forceps were used to dissect around the abscess tissue to open any remaining pockets of purulent fluid.  After drainage was completed, the cavity was irrigated and cleaned.  There was some oozing from the wound edges which was controlled with manual pressure.  The wound was packed with 1/4 inch iodoform gauze and covered with dry gauze and Mepilex foam dressing.  The patient tolerated the procedure well and all sharps were appropriately disposed of at the end of the case.  --Dressing changes with packing gauze once daily.  --Pain control as needed --Continue antibiotics, cultures pending.  I spent 45 minutes dedicated to the care of this patient on the date of this encounter to include pre-visit review of records, face-to-face time with the patient discussing diagnosis and management, and any post-visit coordination of care.   Howie Ill, MD [facility] Pg:  [phone]

## 2023-04-27 NOTE — Progress Notes (Signed)
Pharmacy Antibiotic Note  [name] is a 52 y.o. female admitted on 04/24/2023 with  wound infection - sacral abscess with purulent drainage.  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  Pharmacy has been consulted for Vancomycin dosing.  Note to be COVID + (new onset cough 2 days prior to presentation)     Today, 04/27/2023 Day 3 vancomycin, D1 ceftriaxone/metronidazole Renal SCr 0.53 (at baseline) - due to bedbound state and LE paralysis - suspect CrCl will be overestimating true renal functon WBC 9.4 Afebrile COVID + (completing paxlovid)   Blood cx NGTD 7/29 Wound cx of abscess: P mirabilis, re-incubated Surgery did bedside I&D 8/1, culture pending  Vancomycin levels:  8/1 on vancomycin 1gm IV q12h prior to 6th dose (previous dose given 7/31 at 22:47 Vancomycin trough = 21 mcg/ml at 10:48a   Plan: Based on trough, adjust vancomycin to 1500mg  IV q24h with next dose due 8/2 at 10a  New estimated AUC 479 (Goal 400-600) Est trough 11 mcg/mL recheck trough as needed based on duration of vancomycin therapy or change in renal function Monitor renal function and culture results  Height: 5\' 4"  (162.6 cm) Weight: 85.3 kg (188 lb) IBW/kg (Calculated) : 54.7  Temp (24hrs), Avg:98.2 F (36.8 C), Min:97.5 F (36.4 C), Max:98.8 F (37.1 C)  Recent Labs  Lab 04/24/23 2033 04/24/23 2130 04/25/23 0317 04/26/23 0512 04/27/23 1048  WBC 11.9*  --  11.3* 9.4  --   CREATININE 0.53  --  0.48  --  0.53  LATICACIDVEN  --  0.6  --   --   --   VANCOTROUGH  --   --   --   --  21*    Estimated Creatinine Clearance: 85.9 mL/min (by C-G formula based on SCr of 0.53 mg/dL).    Allergies  Allergen Reactions   Latex Swelling and Other (See Comments)    Reaction:  Facial swelling     Antimicrobials this admission:  7/29 Cefepime x 1 7/29 metronidazole x1 7/29 vanco >>  8/1 ceftriaxone >> 8/1 metronidazole   Dose adjustments this admission:   Microbiology  results:  7/30 BCx: NGTD 7/29 abscess: P mirabilis 8/1 I&D cx:  COVID +   Thank you for allowing pharmacy to be a part of this patient's care.  Juliette Alcide, PharmD, BCPS, BCIDP Work Cell: [phone] 04/27/2023 4:37 PM

## 2023-04-27 NOTE — Progress Notes (Signed)
Progress Note    [name]  WCB:[id number] DOB: [birth year]  DOA: 04/24/2023 PCP: [name], MD      Brief Narrative:    Medical records reviewed and are as summarized below:  [name] is a 52 y.o. female  with medical history significant of multiple sclerosis, bedbound status, paraplegia, left upper extremity weakness, s/p urostomy, who presented to the hospital because of a wound on her left buttock that started about 2 weeks prior to admission.  She had been receiving local wound care but she noticed increasing drainage from the left buttock wound.  She developed a fever with temperature up to 101 F at home about 2 days prior to admission.  She also complained of congested cough.  She tested positive for COVID-19 infection and she was started on Paxlovid.  She was started on IV steroids in the emergency department because she was hypoxic (86%) on room air.  CT abdomen pelvis showed left gluteal abscess (2.5 x 1.5 cm) and possible early osteomyelitis of the left ischium.  She was treated with empiric IV antibiotics.    Assessment/Plan:   Principal Problem:   Left buttock abscess Active Problems:   COVID-19 virus infection   Multiple sclerosis (HCC)   Obesity (BMI 30-39.9)   Neuropathic pain   Paraplegia (HCC)   Primary hypertension   Decubitus ulcer of sacral region, stage 3 (HCC)   History of ileal conduit   Body mass index is 32.27 kg/m.   Sepsis secondary to COVID-19 infection and left buttock abscess, possible early osteomyelitis of the left ischium: Consulted Dr. Aleen Campi, general surgeon, to assist with management.  I&D of left gluteal abscess was performed on 04/27/2023.  Wound culture from abscess is pending. Gram stain from the left buttock wound (from 04/24/2023) showed few gram-positive cocci.  Follow-up wound and blood culture cultures.  Continue IV vancomycin. Continue Paxlovid and IV dexamethasone for COVID-19 infection. Follow-up  with ID specialist for further recommendations.    Acute hypoxic respiratory failure: She is still requiring 1 L/min oxygen via [place].  Wean off oxygen as able. No acute abnormality on chest x-ray.   Hypokalemia: Improved   Multiple sclerosis, paraplegia, neuropathic pain: Continue baclofen, gabapentin and Cymbalta.   History of ileal conduit, hypertension     Diet Order             Diet regular Room service appropriate? Yes; Fluid consistency: Thin  Diet effective now                            Consultants: ID specialist  Procedures: None    Medications:    amantadine  200 mg Oral BID   baclofen  20 mg Oral QID   cholecalciferol  1,000 Units Oral Daily   dexamethasone (DECADRON) injection  6 mg Intravenous Q24H   DULoxetine  60 mg Oral Daily   enoxaparin (LOVENOX) injection  0.5 mg/kg Subcutaneous Q24H   gabapentin  200 mg Oral QID   guaiFENesin  600 mg Oral BID   insulin aspart  0-15 Units Subcutaneous TID WC   insulin aspart  0-5 Units Subcutaneous QHS   nirmatrelvir/ritonavir  3 tablet Oral BID   pantoprazole  40 mg Oral BID   potassium chloride  40 mEq Oral Daily   senna-docusate  2 tablet Oral BID   sodium chloride flush  3 mL Intravenous Q12H   Continuous Infusions:  vancomycin 1,000 mg (  04/26/23 2247)     Anti-infectives (From admission, onward)    Start     Dose/Rate Route Frequency Ordered Stop   04/25/23 2300  vancomycin (VANCOREADY) IVPB 1250 mg/250 mL  Status:  Discontinued        1,250 mg 166.7 mL/hr over 90 Minutes Intravenous Every 24 hours 04/25/23 0228 04/25/23 0904   04/25/23 1200  vancomycin (VANCOCIN) IVPB 1000 mg/200 mL premix        1,000 mg 200 mL/hr over 60 Minutes Intravenous Every 12 hours 04/25/23 0904     04/25/23 0230  nirmatrelvir/ritonavir (PAXLOVID) 3 tablet        3 tablet Oral 2 times daily 04/25/23 0219 04/29/23 2159   04/25/23 0215  vancomycin (VANCOCIN) IVPB 1000 mg/200 mL premix        1,000 mg 200  mL/hr over 60 Minutes Intravenous  Once 04/25/23 0209 04/25/23 0332   04/24/23 2000  ceFEPIme (MAXIPIME) 2 g in sodium chloride 0.9 % 100 mL IVPB        2 g 200 mL/hr over 30 Minutes Intravenous  Once 04/24/23 1955 04/24/23 2226   04/24/23 2000  metroNIDAZOLE (FLAGYL) IVPB 500 mg        500 mg 100 mL/hr over 60 Minutes Intravenous  Once 04/24/23 1955 04/24/23 2349   04/24/23 2000  vancomycin (VANCOCIN) IVPB 1000 mg/200 mL premix        1,000 mg 200 mL/hr over 60 Minutes Intravenous  Once 04/24/23 1955 04/25/23 0013              Family Communication/Anticipated D/C date and plan/Code Status   DVT prophylaxis: SCDs Start: 04/24/23 2351     Code Status: Full Code  Family Communication: None Disposition Plan: Plan to discharge to group home   Status is: Inpatient Remains inpatient appropriate because: Left gluteal abscess, COVID-19 infection       Subjective:   Interval events noted.  She still has a cough.  No shortness of breath or chest pain.  No pain in the buttock.  Objective:    Vitals:   04/26/23 2021 04/27/23 0450 04/27/23 0859 04/27/23 1153  BP: 133/68 (!) 151/89 128/80   Pulse: 97 81 80   Resp: 18 16 18    Temp: 98.2 F (36.8 C) (!) 97.5 F (36.4 C) 98 F (36.7 C)   TempSrc: Oral Oral Oral   SpO2: 96% 96% 94% 97%  Weight:      Height:       No data found.   Intake/Output Summary (Last 24 hours) at 04/27/2023 1157 Last data filed at 04/27/2023 0518 Gross per 24 hour  Intake 2660 ml  Output --  Net 2660 ml   Filed Weights   04/24/23 1841  Weight: 85.3 kg    Exam:  GEN: NAD SKIN: Left buttock wound EYES: EOMI ENT: MMM CV: RRR PULM: CTA B ABD: soft, ND, NT, +BS, + ileal conduit right lower quadrant CNS: AAO x 3, paraplegic EXT: No edema or tenderness    Data Reviewed:   I have personally reviewed following labs and imaging studies:  Labs: Labs show the following:   Basic Metabolic Panel: Recent Labs  Lab 04/24/23 2033  04/25/23 0317 04/27/23 1048  NA 135 137  --   K 3.1* 3.7  --   CL 101 103  --   CO2 23 25  --   GLUCOSE 94 133*  --   BUN 16 11  --   CREATININE 0.53 0.48 0.53  CALCIUM 9.1 8.9  --    GFR Estimated Creatinine Clearance: 85.9 mL/min (by C-G formula based on SCr of 0.53 mg/dL). Liver Function Tests: Recent Labs  Lab 04/24/23 2033  AST 15  ALT 11  ALKPHOS 56  BILITOT 0.8  PROT 8.7*  ALBUMIN 3.9   No results for input(s): "LIPASE", "AMYLASE" in the last 168 hours. No results for input(s): "AMMONIA" in the last 168 hours. Coagulation profile Recent Labs  Lab 04/25/23 0317  INR 1.3*    CBC: Recent Labs  Lab 04/24/23 2033 04/25/23 0317 04/26/23 0512  WBC 11.9* 11.3* 9.4  NEUTROABS 9.3*  --   --   HGB 13.4 12.6 11.5*  HCT 44.0 38.0 35.1*  MCV 96.7 91.1 89.3  PLT 392 366 336   Cardiac Enzymes: No results for input(s): "CKTOTAL", "CKMB", "CKMBINDEX", "TROPONINI" in the last 168 hours. BNP (last 3 results) No results for input(s): "PROBNP" in the last 8760 hours. CBG: Recent Labs  Lab 04/26/23 1202 04/26/23 1650 04/26/23 2023 04/27/23 0757 04/27/23 1134  GLUCAP 142* 190* 247* 116* 124*   D-Dimer: No results for input(s): "DDIMER" in the last 72 hours. Hgb A1c: Recent Labs    04/25/23 0100  HGBA1C 5.0   Lipid Profile: No results for input(s): "CHOL", "HDL", "LDLCALC", "TRIG", "CHOLHDL", "LDLDIRECT" in the last 72 hours. Thyroid function studies: No results for input(s): "TSH", "T4TOTAL", "T3FREE", "THYROIDAB" in the last 72 hours.  Invalid input(s): "FREET3" Anemia work up: No results for input(s): "VITAMINB12", "FOLATE", "FERRITIN", "TIBC", "IRON", "RETICCTPCT" in the last 72 hours. Sepsis Labs: Recent Labs  Lab 04/24/23 2033 04/24/23 2130 04/25/23 0317 04/26/23 0512  PROCALCITON  --   --   --  0.14  WBC 11.9*  --  11.3* 9.4  LATICACIDVEN  --  0.6  --   --     Microbiology Recent Results (from the past 240 hour(s))  Resp panel by RT-PCR  (RSV, Flu A&B, Covid) Abscess     Status: Abnormal   Collection Time: 04/24/23  9:09 PM   Specimen: Abscess; Nasal Swab  Result Value Ref Range Status   SARS Coronavirus 2 by RT PCR POSITIVE (A) NEGATIVE Final    Comment: (NOTE) SARS-CoV-2 target nucleic acids are DETECTED.  The SARS-CoV-2 RNA is generally detectable in upper respiratory specimens during the acute phase of infection. Positive results are indicative of the presence of the identified virus, but do not rule out bacterial infection or co-infection with other pathogens not detected by the test. Clinical correlation with patient history and other diagnostic information is necessary to determine patient infection status. The expected result is Negative.  Fact Sheet for Patients: BloggerCourse.com  Fact Sheet for Healthcare Providers: SeriousBroker.it  This test is not yet approved or cleared by the [place] FDA and  has been authorized for detection and/or diagnosis of SARS-CoV-2 by FDA under an Emergency Use Authorization (EUA).  This EUA will remain in effect (meaning this test can be used) for the duration of  the COVID-19 declaration under Section 564(b)(1) of the A ct, 21 U.S.C. section 360bbb-3(b)(1), unless the authorization is terminated or revoked sooner.     Influenza A by PCR NEGATIVE NEGATIVE Final   Influenza B by PCR NEGATIVE NEGATIVE Final    Comment: (NOTE) The Xpert Xpress SARS-CoV-2/FLU/RSV plus assay is intended as an aid in the diagnosis of influenza from Nasopharyngeal swab specimens and should not be used as a sole basis for treatment. Nasal washings and aspirates are unacceptable for Xpert  Xpress SARS-CoV-2/FLU/RSV testing.  Fact Sheet for Patients: BloggerCourse.com  Fact Sheet for Healthcare Providers: SeriousBroker.it  This test is not yet approved or cleared by the [place] FDA  and has been authorized for detection and/or diagnosis of SARS-CoV-2 by FDA under an Emergency Use Authorization (EUA). This EUA will remain in effect (meaning this test can be used) for the duration of the COVID-19 declaration under Section 564(b)(1) of the Act, 21 U.S.C. section 360bbb-3(b)(1), unless the authorization is terminated or revoked.     Resp Syncytial Virus by PCR NEGATIVE NEGATIVE Final    Comment: (NOTE) Fact Sheet for Patients: BloggerCourse.com  Fact Sheet for Healthcare Providers: SeriousBroker.it  This test is not yet approved or cleared by the [place] FDA and has been authorized for detection and/or diagnosis of SARS-CoV-2 by FDA under an Emergency Use Authorization (EUA). This EUA will remain in effect (meaning this test can be used) for the duration of the COVID-19 declaration under Section 564(b)(1) of the Act, 21 U.S.C. section 360bbb-3(b)(1), unless the authorization is terminated or revoked.  Performed at [facility], [address] [place], [place] [address]   Culture, blood (Routine x 2)     Status: None (Preliminary result)   Collection Time: 04/24/23  9:30 PM   Specimen: BLOOD  Result Value Ref Range Status   Specimen Description BLOOD BLOOD LEFT ARM  Final   Special Requests   Final    BOTTLES DRAWN AEROBIC AND ANAEROBIC Blood Culture results may not be optimal due to an inadequate volume of blood received in culture bottles   Culture   Final    NO GROWTH 3 DAYS Performed at [facility], [address] [place], [place] [address]    Report Status PENDING  Incomplete  Aerobic/Anaerobic Culture w Gram Stain (surgical/deep wound)     Status: None (Preliminary result)   Collection Time: 04/24/23 11:35 PM   Specimen: Abscess  Result Value Ref Range Status   Specimen Description   Final    ABSCESS Performed at [facility], [address], [place],  [place] [address]    Special Requests   Final    NONE Performed at [facility], [address] [place], [place] [address]    Gram Stain   Final    RARE WBC PRESENT, PREDOMINANTLY PMN FEW GRAM POSITIVE COCCI    Culture   Final    CULTURE REINCUBATED FOR BETTER GROWTH Performed at [facility], [address], [name], [place] [address]    Report Status PENDING  Incomplete  Culture, blood (Routine x 2)     Status: None (Preliminary result)   Collection Time: 04/25/23  2:44 AM   Specimen: BLOOD  Result Value Ref Range Status   Specimen Description BLOOD LEFT HAND  Final   Special Requests   Final    BOTTLES DRAWN AEROBIC AND ANAEROBIC Blood Culture adequate volume   Culture   Final    NO GROWTH 2 DAYS Performed at [facility], [address] [place], [place] [address]    Report Status PENDING  Incomplete    Procedures and diagnostic studies:  DG Chest Port 1 View  Result Date: 04/26/2023 CLINICAL DATA:  Cough. EXAM: PORTABLE CHEST 1 VIEW COMPARISON:  Chest radiograph dated 04/24/2023. chest CT dated 12/03/2021. FINDINGS: Linear right lung base atelectasis/scarring. No new consolidation. There is no pleural effusion or pneumothorax. The cardiac silhouette is within normal limits. No acute osseous pathology. IMPRESSION: No active  disease. Electronically Signed   By: Elgie Collard M.D.   On: 04/26/2023 01:08               LOS: 3 days      [facility]   Pager on [email or web address]. If 7PM-7AM, please contact night-coverage at [email or web address]     04/27/2023, 11:57 AM

## 2023-04-28 DIAGNOSIS — G35 Multiple sclerosis: Secondary | ICD-10-CM | POA: Diagnosis not present

## 2023-04-28 DIAGNOSIS — U071 COVID-19: Secondary | ICD-10-CM | POA: Diagnosis not present

## 2023-04-28 DIAGNOSIS — L89153 Pressure ulcer of sacral region, stage 3: Secondary | ICD-10-CM | POA: Diagnosis not present

## 2023-04-28 DIAGNOSIS — L0231 Cutaneous abscess of buttock: Secondary | ICD-10-CM | POA: Diagnosis not present

## 2023-04-28 LAB — GLUCOSE, CAPILLARY
Glucose-Capillary: 114 mg/dL — ABNORMAL HIGH (ref 70–99)
Glucose-Capillary: 137 mg/dL — ABNORMAL HIGH (ref 70–99)
Glucose-Capillary: 124 mg/dL — ABNORMAL HIGH (ref 70–99)
Glucose-Capillary: 106 mg/dL — ABNORMAL HIGH (ref 70–99)

## 2023-04-28 MED ORDER — ORAL CARE MOUTH RINSE: 15.0000 mL | OROMUCOSAL | Status: DC | PRN

## 2023-04-28 MED ORDER — GUAIFENESIN ER 600 MG PO TB12
600.0000 mg | ORAL_TABLET | Freq: Two times a day (BID) | ORAL | Status: DC | PRN
  Administered 2023-04-29 – 2023-05-01 (×4): 600 mg via ORAL
  Filled 2023-04-28 (×5): qty 1

## 2023-04-28 NOTE — Progress Notes (Addendum)
Date of Admission:  04/24/2023     ID: [name] is a 52 y.o. female  Principal Problem:   Left buttock abscess Active Problems:   Neuropathic pain   Multiple sclerosis (HCC)   Obesity (BMI 30-39.9)   Paraplegia (HCC)   Primary hypertension   Decubitus ulcer of sacral region, stage 3 (HCC)   COVID-19 virus infection   History of ileal conduit   Gluteal abscess    Subjective: Still has cough But feeling better overall Medications:   amantadine  200 mg Oral BID   baclofen  20 mg Oral QID   cholecalciferol  1,000 Units Oral Daily   DULoxetine  60 mg Oral Daily   enoxaparin (LOVENOX) injection  0.5 mg/kg Subcutaneous Q24H   gabapentin  200 mg Oral QID   guaiFENesin  600 mg Oral BID   insulin aspart  0-15 Units Subcutaneous TID WC   insulin aspart  0-5 Units Subcutaneous QHS   metroNIDAZOLE  500 mg Oral Q12H   nirmatrelvir/ritonavir  3 tablet Oral BID   pantoprazole  40 mg Oral BID   potassium chloride  40 mEq Oral Daily   senna-docusate  2 tablet Oral BID   sodium chloride flush  3 mL Intravenous Q12H    Objective: Vital signs in last 24 hours: Patient Vitals for the past 24 hrs:  BP Temp Temp src Pulse Resp SpO2  04/28/23 0757 (!) 140/74 97.8 F (36.6 C) Oral 68 16 98 %  04/28/23 0457 (!) 131/58 97.8 F (36.6 C) Oral 63 16 100 %  04/27/23 2116 93/63 98.2 F (36.8 C) Oral 75 20 99 %  04/27/23 1618 (!) 139/56 98.5 F (36.9 C) -- 68 18 100 %  04/27/23 1153 -- -- -- -- -- 97 %      PHYSICAL EXAM:  General: Alert, cooperative, no distress, Lungs: Bilateral air entry.  Few rhonchi es. Heart: Regular rate and rhythm, no murmur, rub or gallop. Abdomen: Soft, non-tender,not distended.  Ileal conduit  extremities: Edema legs Skin: No rashes or lesions. Or bruising Lymph: Cervical, supraclavicular normal. Neurologic: Paraplegia    Lab Results    Latest Ref Rng & Units 04/26/2023    5:12 AM 04/25/2023    3:17 AM 04/24/2023    8:33 PM  CBC  WBC 4.0 -  10.5 K/uL 9.4  11.3  11.9   Hemoglobin 12.0 - 15.0 g/dL 09.8  11.9  14.7   Hematocrit 36.0 - 46.0 % 35.1  38.0  44.0   Platelets 150 - 400 K/uL 336  366  392        Latest Ref Rng & Units 04/27/2023   10:48 AM 04/25/2023    3:17 AM 04/24/2023    8:33 PM  CMP  Glucose 70 - 99 mg/dL  829  94   BUN 6 - 20 mg/dL  11  16   Creatinine 5.62 - 1.00 mg/dL 1.30  8.65  7.84   Sodium 135 - 145 mmol/L  137  135   Potassium 3.5 - 5.1 mmol/L  3.7  3.1   Chloride 98 - 111 mmol/L  103  101   CO2 22 - 32 mmol/L  25  23   Calcium 8.9 - 10.3 mg/dL  8.9  9.1   Total Protein 6.5 - 8.1 g/dL   8.7   Total Bilirubin 0.3 - 1.2 mg/dL   0.8   Alkaline Phos 38 - 126 U/L   56   AST 15 - 41  U/L   15   ALT 0 - 44 U/L   11       Microbiology: Blood culture no growth Abscess culture equal and Proteus Studies/Results: No results found.   Assessment/Plan: 52 y.o.female  with a history of MS, paraplegia, neurogenic bladder s/p ileal conduit , left medial gluteal  decubitus presented from home with fever and increased drainage from the sacral wound-   Fever-like 3 secondary to COVID has a dissolved On paxlovid   Left medial gluteal pressure ulcer-with surrounding abscess surgery saw her and did I/D to make the opening larger so that the underlying abscess could drain E. coli and Proteus in culture.  Continue ceftriaxone.  DC vancomycin.  Continue Flagyl already pending.  May be able to do p.o. antibiotic on discharge Multiple sclerosis  Paraplegia   Neurogenic bladder- ileal conduit   HTN   Discussed the management with patient and care team [facility] available for urgent issues this weekend.  Call if needed

## 2023-04-28 NOTE — Plan of Care (Signed)
  Problem: Coping: Goal: Ability to adjust to condition or change in health will improve Outcome: Progressing   Problem: Health Behavior/Discharge Planning: Goal: Ability to identify and utilize available resources and services will improve Outcome: Progressing Goal: Ability to manage health-related needs will improve Outcome: Progressing   Problem: Metabolic: Goal: Ability to maintain appropriate glucose levels will improve Outcome: Progressing   Problem: Nutritional: Goal: Maintenance of adequate nutrition will improve Outcome: Progressing

## 2023-04-28 NOTE — Care Management Important Message (Signed)
Important Message  Patient Details  Name: [name] MRN: [id number] Date of Birth: 18-Jun-1971   Medicare Important Message Given:  Yes  Patient is in an isolation room so I reviewed the Important Message from Medicare by phone [phone]). She stated she understood her rights and thanked me for calling. I wished her a speedy recovery.   Olegario Messier A  04/28/2023, 2:18 PM

## 2023-04-28 NOTE — Progress Notes (Addendum)
Pharmacy Antibiotic Note  [name] is a 52 y.o. female admitted on 04/24/2023 with  wound infection - sacral abscess with purulent drainage.  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  Pharmacy has been consulted for Vancomycin dosing.  Note to be COVID + (new onset cough 2 days prior to presentation).  Today, 04/28/2023 Day 2 ceftriaxone/metronidazole Renal SCr 0.53 (at baseline) - due to bedbound state and LE paralysis - suspect CrCl will be overestimating true renal function 7/29 Wound cx of abscess: P mirabilis, E. coli Vancomycin discontinued by provider per culture results Pharmacy will sign off and continue to monitor peripherally  Height: 5\' 4"  (162.6 cm) Weight: 85.3 kg (188 lb) IBW/kg (Calculated) : 54.7  Temp (24hrs), Avg:98.1 F (36.7 C), Min:97.8 F (36.6 C), Max:98.5 F (36.9 C)  Recent Labs  Lab 04/24/23 2033 04/24/23 2130 04/25/23 0317 04/26/23 0512 04/27/23 1048  WBC 11.9*  --  11.3* 9.4  --   CREATININE 0.53  --  0.48  --  0.53  LATICACIDVEN  --  0.6  --   --   --   VANCOTROUGH  --   --   --   --  21*    Estimated Creatinine Clearance: 85.9 mL/min (by C-G formula based on SCr of 0.53 mg/dL).    Allergies  Allergen Reactions   Latex Swelling and Other (See Comments)    Reaction:  Facial swelling     Antimicrobials this admission: 7/29 Cefepime x 1 7/29 metronidazole x1 7/29 vanco >> 8/2 8/1 ceftriaxone >> 8/1 metronidazole >>   Dose adjustments this admission:  Microbiology results: 7/29 Bcx: NGD4  7/30 BCx: NGD3 7/29 abscess: P mirabilis, E coli  8/1 I&D cx: pending 7/29 COVID +   Thank you for allowing pharmacy to be a part of this patient's care.  Harlow Ohms. Juline Patch, PharmD PGY1 Pharmacy Resident 04/28/2023 2:21 PM

## 2023-04-28 NOTE — Progress Notes (Signed)
[facility] SURGICAL PROGRESS NOTE  Hospital Day(s): 4.   Interval History:  Patient seen and examined No acute events or new complaints overnight.  Patient reports she is doing well Some soreness with dressing changes  No new labs Initial Cx from 07/29 with GPC Wound Cx from 08/01 without growth She continues on Rocephin, Flagyl, Vancomcyin  Vital signs in last 24 hours: [min-max] current  Temp:  [97.8 F (36.6 C)-98.5 F (36.9 C)] 97.8 F (36.6 C) (08/02 0457) Pulse Rate:  [63-80] 63 (08/02 0457) Resp:  [16-20] 16 (08/02 0457) BP: (93-139)/(56-80) 131/58 (08/02 0457) SpO2:  [94 %-100 %] 100 % (08/02 0457)     Height: 5\' 4"  (162.6 cm) Weight: 85.3 kg BMI (Calculated): 32.25   Intake/Output last 2 shifts:  08/01 0701 - 08/02 0700 In: 100 [IV Piggyback:100] Out: 750 [Urine:750]   Physical Exam:  Constitutional: alert, cooperative and no distress  Respiratory: breathing non-labored at rest  Cardiovascular: regular rate and sinus rhythm  Integumentary: I&D site to left buttock, no erythema, scant oozing with dressing change, no residual purulence appreciated. Dressing changed without incident    Labs:     Latest Ref Rng & Units 04/26/2023    5:12 AM 04/25/2023    3:17 AM 04/24/2023    8:33 PM  CBC  WBC 4.0 - 10.5 K/uL 9.4  11.3  11.9   Hemoglobin 12.0 - 15.0 g/dL 16.1  09.6  04.5   Hematocrit 36.0 - 46.0 % 35.1  38.0  44.0   Platelets 150 - 400 K/uL 336  366  392       Latest Ref Rng & Units 04/27/2023   10:48 AM 04/25/2023    3:17 AM 04/24/2023    8:33 PM  CMP  Glucose 70 - 99 mg/dL  409  94   BUN 6 - 20 mg/dL  11  16   Creatinine 8.11 - 1.00 mg/dL 9.14  7.82  9.56   Sodium 135 - 145 mmol/L  137  135   Potassium 3.5 - 5.1 mmol/L  3.7  3.1   Chloride 98 - 111 mmol/L  103  101   CO2 22 - 32 mmol/L  25  23   Calcium 8.9 - 10.3 mg/dL  8.9  9.1   Total Protein 6.5 - 8.1 g/dL   8.7   Total Bilirubin 0.3 - 1.2 mg/dL   0.8   Alkaline Phos 38 - 126 U/L    56   AST 15 - 41 U/L   15   ALT 0 - 44 U/L   11      Imaging studies: No new pertinent imaging studies   Assessment/Plan:  52 y.o. female 1 day s/p bedside incision and drainage of left gluteal abscess    - Continue local wound care; Pack daily, cover, secure. Change as needed - completed this morning  - Continue IV Abx (Ceftriaxone, Flagyl, Vancomycin); Follow up Cx - Frequent repositioning, pressure offloading   - Pain control prn   - Will order home health  - Further management per primary service  - Nothing further from surgical perspective; dressing changes as above. Follow up Cx and narrow Abx. She is established with wound care center as an outpatient, recommend continued follow up with them. We will of course be available to assist as needed    All of the above findings and recommendations were discussed with the patient, and the medical team, and all of patient's questions were answered to her  expressed satisfaction.  -- Lynden Oxford, PA-C [facility] 04/28/2023, 7:01 AM M-F: 7am - 4pm

## 2023-04-28 NOTE — Progress Notes (Addendum)
Progress Note    [name]  IHK:[id number] DOB: 1971-09-17  DOA: 04/24/2023 PCP: [name], MD      Brief Narrative:    Medical records reviewed and are as summarized below:  [name] is a 52 y.o. female  with medical history significant of multiple sclerosis, bedbound status, paraplegia, left upper extremity weakness, s/p urostomy, who presented to the hospital because of a wound on her left buttock that started about 2 weeks prior to admission.  She had been receiving local wound care but she noticed increasing drainage from the left buttock wound.  She developed a fever with temperature up to 101 F at home about 2 days prior to admission.  She also complained of congested cough.  She tested positive for COVID-19 infection and she was started on Paxlovid.  She was started on IV steroids in the emergency department because she was hypoxic (86%) on room air.  CT abdomen pelvis showed left gluteal abscess (2.5 x 1.5 cm) and possible early osteomyelitis of the left ischium.  She was treated with empiric IV antibiotics.    Assessment/Plan:   Principal Problem:   Left buttock abscess Active Problems:   COVID-19 virus infection   Multiple sclerosis (HCC)   Obesity (BMI 30-39.9)   Neuropathic pain   Paraplegia (HCC)   Primary hypertension   Decubitus ulcer of sacral region, stage 3 (HCC)   History of ileal conduit   Gluteal abscess   Body mass index is 32.27 kg/m.   Sepsis secondary to COVID-19 infection and left buttock abscess, possible early osteomyelitis of the left ischium: S/p I&D of left gluteal abscess on 04/27/2023.   Gram stain from the left buttock wound (from 04/24/2023) showed few gram-positive cocci, and culture showed Proteus mirabilis and E. coli.  Antibiotics have been switched to IV ceftriaxone and oral Flagyl. Continue Paxlovid through 04/29/2023. Discontinue IV dexamethasone. She will follow-up with our outpatient wound care center  for local wound care.   Acute hypoxic respiratory failure: Resolved.   No acute abnormality on chest x-ray.   Hypokalemia: Improved   Multiple sclerosis, paraplegia, neuropathic pain: Continue baclofen, gabapentin and Cymbalta.   History of ileal conduit, hypertension     Diet Order             Diet regular Room service appropriate? Yes; Fluid consistency: Thin  Diet effective now                            Consultants: ID specialist  Procedures: None    Medications:    amantadine  200 mg Oral BID   baclofen  20 mg Oral QID   cholecalciferol  1,000 Units Oral Daily   DULoxetine  60 mg Oral Daily   enoxaparin (LOVENOX) injection  0.5 mg/kg Subcutaneous Q24H   gabapentin  200 mg Oral QID   guaiFENesin  600 mg Oral BID   insulin aspart  0-15 Units Subcutaneous TID WC   insulin aspart  0-5 Units Subcutaneous QHS   metroNIDAZOLE  500 mg Oral Q12H   nirmatrelvir/ritonavir  3 tablet Oral BID   pantoprazole  40 mg Oral BID   potassium chloride  40 mEq Oral Daily   senna-docusate  2 tablet Oral BID   sodium chloride flush  3 mL Intravenous Q12H   Continuous Infusions:  cefTRIAXone (ROCEPHIN)  IV Stopped (04/27/23 1535)     Anti-infectives (From admission, onward)  Start     Dose/Rate Route Frequency Ordered Stop   04/28/23 1000  vancomycin (VANCOREADY) IVPB 1500 mg/300 mL  Status:  Discontinued        1,500 mg 150 mL/hr over 120 Minutes Intravenous Every 24 hours 04/27/23 1637 04/28/23 1114   04/27/23 1530  cefTRIAXone (ROCEPHIN) 2 g in sodium chloride 0.9 % 100 mL IVPB        2 g 200 mL/hr over 30 Minutes Intravenous Every 24 hours 04/27/23 1423     04/27/23 1515  metroNIDAZOLE (FLAGYL) tablet 500 mg        500 mg Oral Every 12 hours 04/27/23 1423     04/25/23 2300  vancomycin (VANCOREADY) IVPB 1250 mg/250 mL  Status:  Discontinued        1,250 mg 166.7 mL/hr over 90 Minutes Intravenous Every 24 hours 04/25/23 0228 04/25/23 0904   04/25/23  1200  vancomycin (VANCOCIN) IVPB 1000 mg/200 mL premix  Status:  Discontinued        1,000 mg 200 mL/hr over 60 Minutes Intravenous Every 12 hours 04/25/23 0904 04/27/23 1637   04/25/23 0230  nirmatrelvir/ritonavir (PAXLOVID) 3 tablet        3 tablet Oral 2 times daily 04/25/23 0219 04/29/23 2159   04/25/23 0215  vancomycin (VANCOCIN) IVPB 1000 mg/200 mL premix        1,000 mg 200 mL/hr over 60 Minutes Intravenous  Once 04/25/23 0209 04/25/23 0332   04/24/23 2000  ceFEPIme (MAXIPIME) 2 g in sodium chloride 0.9 % 100 mL IVPB        2 g 200 mL/hr over 30 Minutes Intravenous  Once 04/24/23 1955 04/24/23 2226   04/24/23 2000  metroNIDAZOLE (FLAGYL) IVPB 500 mg        500 mg 100 mL/hr over 60 Minutes Intravenous  Once 04/24/23 1955 04/24/23 2349   04/24/23 2000  vancomycin (VANCOCIN) IVPB 1000 mg/200 mL premix        1,000 mg 200 mL/hr over 60 Minutes Intravenous  Once 04/24/23 1955 04/25/23 0013              Family Communication/Anticipated D/C date and plan/Code Status   DVT prophylaxis: SCDs Start: 04/24/23 2351     Code Status: Full Code  Family Communication: None Disposition Plan: Plan to discharge to group home   Status is: Inpatient Remains inpatient appropriate because: Left gluteal abscess, COVID-19 infection       Subjective:   Interval events noted.  No new complaints.  She complains of cough.  No shortness of breath.  Objective:    Vitals:   04/27/23 1618 04/27/23 2116 04/28/23 0457 04/28/23 0757  BP: (!) 139/56 93/63 (!) 131/58 (!) 140/74  Pulse: 68 75 63 68  Resp: 18 20 16 16   Temp: 98.5 F (36.9 C) 98.2 F (36.8 C) 97.8 F (36.6 C) 97.8 F (36.6 C)  TempSrc:  Oral Oral Oral  SpO2: 100% 99% 100% 98%  Weight:      Height:       No data found.   Intake/Output Summary (Last 24 hours) at 04/28/2023 1206 Last data filed at 04/28/2023 1009 Gross per 24 hour  Intake 100 ml  Output 1650 ml  Net -1550 ml   Filed Weights   04/24/23 1841   Weight: 85.3 kg    Exam:  GEN: NAD SKIN: Warm and dry.  Left buttock wound EYES: No pallor or icterus ENT: MMM CV: RRR PULM: CTA B ABD: soft, ND, NT, +BS, +  ileal conduit right lower quadrant CNS: AAO x 3, paraplegic EXT: No edema or tenderness     Data Reviewed:   I have personally reviewed following labs and imaging studies:  Labs: Labs show the following:   Basic Metabolic Panel: Recent Labs  Lab 04/24/23 2033 04/25/23 0317 04/27/23 1048  NA 135 137  --   K 3.1* 3.7  --   CL 101 103  --   CO2 23 25  --   GLUCOSE 94 133*  --   BUN 16 11  --   CREATININE 0.53 0.48 0.53  CALCIUM 9.1 8.9  --    GFR Estimated Creatinine Clearance: 85.9 mL/min (by C-G formula based on SCr of 0.53 mg/dL). Liver Function Tests: Recent Labs  Lab 04/24/23 2033  AST 15  ALT 11  ALKPHOS 56  BILITOT 0.8  PROT 8.7*  ALBUMIN 3.9   No results for input(s): "LIPASE", "AMYLASE" in the last 168 hours. No results for input(s): "AMMONIA" in the last 168 hours. Coagulation profile Recent Labs  Lab 04/25/23 0317  INR 1.3*    CBC: Recent Labs  Lab 04/24/23 2033 04/25/23 0317 04/26/23 0512  WBC 11.9* 11.3* 9.4  NEUTROABS 9.3*  --   --   HGB 13.4 12.6 11.5*  HCT 44.0 38.0 35.1*  MCV 96.7 91.1 89.3  PLT 392 366 336   Cardiac Enzymes: No results for input(s): "CKTOTAL", "CKMB", "CKMBINDEX", "TROPONINI" in the last 168 hours. BNP (last 3 results) No results for input(s): "PROBNP" in the last 8760 hours. CBG: Recent Labs  Lab 04/27/23 0757 04/27/23 1134 04/27/23 1616 04/27/23 2111 04/28/23 0753  GLUCAP 116* 124* 135* 137* 114*   D-Dimer: No results for input(s): "DDIMER" in the last 72 hours. Hgb A1c: No results for input(s): "HGBA1C" in the last 72 hours.  Lipid Profile: No results for input(s): "CHOL", "HDL", "LDLCALC", "TRIG", "CHOLHDL", "LDLDIRECT" in the last 72 hours. Thyroid function studies: No results for input(s): "TSH", "T4TOTAL", "T3FREE",  "THYROIDAB" in the last 72 hours.  Invalid input(s): "FREET3" Anemia work up: No results for input(s): "VITAMINB12", "FOLATE", "FERRITIN", "TIBC", "IRON", "RETICCTPCT" in the last 72 hours. Sepsis Labs: Recent Labs  Lab 04/24/23 2033 04/24/23 2130 04/25/23 0317 04/26/23 0512  PROCALCITON  --   --   --  0.14  WBC 11.9*  --  11.3* 9.4  LATICACIDVEN  --  0.6  --   --     Microbiology Recent Results (from the past 240 hour(s))  Resp panel by RT-PCR (RSV, Flu A&B, Covid) Abscess     Status: Abnormal   Collection Time: 04/24/23  9:09 PM   Specimen: Abscess; Nasal Swab  Result Value Ref Range Status   SARS Coronavirus 2 by RT PCR POSITIVE (A) NEGATIVE Final    Comment: (NOTE) SARS-CoV-2 target nucleic acids are DETECTED.  The SARS-CoV-2 RNA is generally detectable in upper respiratory specimens during the acute phase of infection. Positive results are indicative of the presence of the identified virus, but do not rule out bacterial infection or co-infection with other pathogens not detected by the test. Clinical correlation with patient history and other diagnostic information is necessary to determine patient infection status. The expected result is Negative.  Fact Sheet for Patients: BloggerCourse.com  Fact Sheet for Healthcare Providers: SeriousBroker.it  This test is not yet approved or cleared by the [place] FDA and  has been authorized for detection and/or diagnosis of SARS-CoV-2 by FDA under an Emergency Use Authorization (EUA).  This EUA will  remain in effect (meaning this test can be used) for the duration of  the COVID-19 declaration under Section 564(b)(1) of the A ct, 21 U.S.C. section 360bbb-3(b)(1), unless the authorization is terminated or revoked sooner.     Influenza A by PCR NEGATIVE NEGATIVE Final   Influenza B by PCR NEGATIVE NEGATIVE Final    Comment: (NOTE) The Xpert Xpress SARS-CoV-2/FLU/RSV  plus assay is intended as an aid in the diagnosis of influenza from Nasopharyngeal swab specimens and should not be used as a sole basis for treatment. Nasal washings and aspirates are unacceptable for Xpert Xpress SARS-CoV-2/FLU/RSV testing.  Fact Sheet for Patients: BloggerCourse.com  Fact Sheet for Healthcare Providers: SeriousBroker.it  This test is not yet approved or cleared by the [place] FDA and has been authorized for detection and/or diagnosis of SARS-CoV-2 by FDA under an Emergency Use Authorization (EUA). This EUA will remain in effect (meaning this test can be used) for the duration of the COVID-19 declaration under Section 564(b)(1) of the Act, 21 U.S.C. section 360bbb-3(b)(1), unless the authorization is terminated or revoked.     Resp Syncytial Virus by PCR NEGATIVE NEGATIVE Final    Comment: (NOTE) Fact Sheet for Patients: BloggerCourse.com  Fact Sheet for Healthcare Providers: SeriousBroker.it  This test is not yet approved or cleared by the [place] FDA and has been authorized for detection and/or diagnosis of SARS-CoV-2 by FDA under an Emergency Use Authorization (EUA). This EUA will remain in effect (meaning this test can be used) for the duration of the COVID-19 declaration under Section 564(b)(1) of the Act, 21 U.S.C. section 360bbb-3(b)(1), unless the authorization is terminated or revoked.  Performed at [facility], [address] [place], [place] [address]   Culture, blood (Routine x 2)     Status: None (Preliminary result)   Collection Time: 04/24/23  9:30 PM   Specimen: BLOOD  Result Value Ref Range Status   Specimen Description BLOOD BLOOD LEFT ARM  Final   Special Requests   Final    BOTTLES DRAWN AEROBIC AND ANAEROBIC Blood Culture results may not be optimal due to an inadequate volume of blood received in culture bottles    Culture   Final    NO GROWTH 4 DAYS Performed at [facility], [address] [place], [place] [address]    Report Status PENDING  Incomplete  Aerobic/Anaerobic Culture w Gram Stain (surgical/deep wound)     Status: None (Preliminary result)   Collection Time: 04/24/23 11:35 PM   Specimen: Abscess  Result Value Ref Range Status   Specimen Description   Final    ABSCESS Performed at [facility], [address] [place], [place] [address]    Special Requests   Final    NONE Performed at [facility], [address], [place], [place] [address]    Gram Stain   Final    RARE WBC PRESENT, PREDOMINANTLY PMN FEW GRAM POSITIVE COCCI Performed at [facility], [address] [place], [place] [address]    Culture   Final    MODERATE PROTEUS MIRABILIS MODERATE ESCHERICHIA COLI NO ANAEROBES ISOLATED; CULTURE IN PROGRESS FOR 5 DAYS    Report Status PENDING  Incomplete   Organism ID, Bacteria PROTEUS MIRABILIS  Final   Organism ID, Bacteria ESCHERICHIA COLI  Final      Susceptibility   Escherichia coli - MIC*    AMPICILLIN >=32 RESISTANT Resistant     CEFEPIME <=0.12 SENSITIVE Sensitive  CEFTAZIDIME <=1 SENSITIVE Sensitive     CEFTRIAXONE <=0.25 SENSITIVE Sensitive     CIPROFLOXACIN <=0.25 SENSITIVE Sensitive     GENTAMICIN <=1 SENSITIVE Sensitive     IMIPENEM <=0.25 SENSITIVE Sensitive     TRIMETH/SULFA <=20 SENSITIVE Sensitive     AMPICILLIN/SULBACTAM >=32 RESISTANT Resistant     PIP/TAZO <=4 SENSITIVE Sensitive     * MODERATE ESCHERICHIA COLI   Proteus mirabilis - MIC*    AMPICILLIN <=2 SENSITIVE Sensitive     CEFEPIME <=0.12 SENSITIVE Sensitive     CEFTAZIDIME <=1 SENSITIVE Sensitive     CEFTRIAXONE <=0.25 SENSITIVE Sensitive     CIPROFLOXACIN <=0.25 SENSITIVE Sensitive     GENTAMICIN <=1 SENSITIVE Sensitive     IMIPENEM 2 SENSITIVE Sensitive     TRIMETH/SULFA <=20 SENSITIVE Sensitive     AMPICILLIN/SULBACTAM <=2 SENSITIVE  Sensitive     PIP/TAZO <=4 SENSITIVE Sensitive     * MODERATE PROTEUS MIRABILIS  Culture, blood (Routine x 2)     Status: None (Preliminary result)   Collection Time: 04/25/23  2:44 AM   Specimen: BLOOD  Result Value Ref Range Status   Specimen Description BLOOD LEFT HAND  Final   Special Requests   Final    BOTTLES DRAWN AEROBIC AND ANAEROBIC Blood Culture adequate volume   Culture   Final    NO GROWTH 3 DAYS Performed at [facility], [address] [place], [place] [address]    Report Status PENDING  Incomplete  Aerobic/Anaerobic Culture w Gram Stain (surgical/deep wound)     Status: None (Preliminary result)   Collection Time: 04/27/23  6:27 PM   Specimen: Buttocks; Abscess  Result Value Ref Range Status   Specimen Description   Final    BUTTOCKS Performed at [facility], [address] [place], [place] [address]    Special Requests   Final    NONE Performed at [facility], [address] [place], [place] [address]    Gram Stain NO WBC SEEN NO ORGANISMS SEEN   Final   Culture   Final    TOO YOUNG TO READ Performed at [facility], [address] [place], [place] [address]    Report Status PENDING  Incomplete    Procedures and diagnostic studies:  No results found.             LOS: 4 days      [facility]   Pager on [email or web address]. If 7PM-7AM, please contact night-coverage at [email or web address]     04/28/2023, 12:06 PM

## 2023-04-29 DIAGNOSIS — L0231 Cutaneous abscess of buttock: Secondary | ICD-10-CM | POA: Diagnosis not present

## 2023-04-29 LAB — GLUCOSE, CAPILLARY
Glucose-Capillary: 92 mg/dL (ref 70–99)
Glucose-Capillary: 90 mg/dL (ref 70–99)
Glucose-Capillary: 99 mg/dL (ref 70–99)
Glucose-Capillary: 122 mg/dL — ABNORMAL HIGH (ref 70–99)

## 2023-04-29 MED ORDER — HYDROCODONE BIT-HOMATROP MBR 5-1.5 MG/5ML PO SOLN
5.0000 mL | Freq: Four times a day (QID) | ORAL | Status: AC | PRN
  Administered 2023-04-29 – 2023-04-30 (×2): 5 mL via ORAL
  Filled 2023-04-29 (×2): qty 5

## 2023-04-29 NOTE — Progress Notes (Signed)
Progress Note    [name]  OAC:[id number] DOB: Feb 06, 1971  DOA: 04/24/2023 PCP: [name], MD      Brief Narrative:    Medical records reviewed and are as summarized below:  [name] is a 52 y.o. female  with medical history significant of multiple sclerosis, bedbound status, paraplegia, left upper extremity weakness, s/p urostomy, who presented to the hospital because of a wound on her left buttock that started about 2 weeks prior to admission.  She had been receiving local wound care but she noticed increasing drainage from the left buttock wound.  She developed a fever with temperature up to 101 F at home about 2 days prior to admission.  She also complained of congested cough.  She tested positive for COVID-19 infection and she was started on Paxlovid.  She was started on IV steroids in the emergency department because she was hypoxic (86%) on room air.  CT abdomen pelvis showed left gluteal abscess (2.5 x 1.5 cm) and possible early osteomyelitis of the left ischium.  She was treated with empiric IV antibiotics.    Assessment/Plan:   Principal Problem:   Left buttock abscess Active Problems:   COVID-19   Multiple sclerosis (HCC)   Obesity (BMI 30-39.9)   Neuropathic pain   Paraplegia (HCC)   Primary hypertension   Decubitus ulcer of sacral region, stage 3 (HCC)   History of ileal conduit   Gluteal abscess   Body mass index is 32.27 kg/m.   Sepsis secondary to COVID-19 infection and left buttock abscess, possible early osteomyelitis of the left ischium: S/p I&D of left gluteal abscess on 04/27/2023.   Gram stain from the left buttock wound (from 04/24/2023) showed few gram-positive cocci, and culture showed Proteus mirabilis and E. coli.  Continue IV ceftriaxone and oral Flagyl. Plan to complete 5 days of Paxlovid today.  Hycodan syrup as needed for cough She completed 3 days of IV dexamethasone on 04/28/2023 She will follow-up with our  outpatient wound care center for local wound care.   Acute hypoxic respiratory failure: Resolved.   No acute abnormality on chest x-ray.   Hypokalemia: Improved   Multiple sclerosis, paraplegia, neuropathic pain: Continue baclofen, gabapentin and Cymbalta.   History of ileal conduit, hypertension     Diet Order             Diet regular Room service appropriate? Yes; Fluid consistency: Thin  Diet effective now                            Consultants: ID specialist  Procedures: None    Medications:    amantadine  200 mg Oral BID   baclofen  20 mg Oral QID   cholecalciferol  1,000 Units Oral Daily   DULoxetine  60 mg Oral Daily   enoxaparin (LOVENOX) injection  0.5 mg/kg Subcutaneous Q24H   gabapentin  200 mg Oral QID   insulin aspart  0-15 Units Subcutaneous TID WC   insulin aspart  0-5 Units Subcutaneous QHS   metroNIDAZOLE  500 mg Oral Q12H   nirmatrelvir/ritonavir  3 tablet Oral BID   pantoprazole  40 mg Oral BID   potassium chloride  40 mEq Oral Daily   senna-docusate  2 tablet Oral BID   sodium chloride flush  3 mL Intravenous Q12H   Continuous Infusions:  cefTRIAXone (ROCEPHIN)  IV 2 g (04/28/23 1632)     Anti-infectives (From  admission, onward)    Start     Dose/Rate Route Frequency Ordered Stop   04/28/23 1000  vancomycin (VANCOREADY) IVPB 1500 mg/300 mL  Status:  Discontinued        1,500 mg 150 mL/hr over 120 Minutes Intravenous Every 24 hours 04/27/23 1637 04/28/23 1114   04/27/23 1530  cefTRIAXone (ROCEPHIN) 2 g in sodium chloride 0.9 % 100 mL IVPB        2 g 200 mL/hr over 30 Minutes Intravenous Every 24 hours 04/27/23 1423     04/27/23 1515  metroNIDAZOLE (FLAGYL) tablet 500 mg        500 mg Oral Every 12 hours 04/27/23 1423     04/25/23 2300  vancomycin (VANCOREADY) IVPB 1250 mg/250 mL  Status:  Discontinued        1,250 mg 166.7 mL/hr over 90 Minutes Intravenous Every 24 hours 04/25/23 0228 04/25/23 0904   04/25/23 1200   vancomycin (VANCOCIN) IVPB 1000 mg/200 mL premix  Status:  Discontinued        1,000 mg 200 mL/hr over 60 Minutes Intravenous Every 12 hours 04/25/23 0904 04/27/23 1637   04/25/23 0230  nirmatrelvir/ritonavir (PAXLOVID) 3 tablet        3 tablet Oral 2 times daily 04/25/23 0219 04/29/23 2159   04/25/23 0215  vancomycin (VANCOCIN) IVPB 1000 mg/200 mL premix        1,000 mg 200 mL/hr over 60 Minutes Intravenous  Once 04/25/23 0209 04/25/23 0332   04/24/23 2000  ceFEPIme (MAXIPIME) 2 g in sodium chloride 0.9 % 100 mL IVPB        2 g 200 mL/hr over 30 Minutes Intravenous  Once 04/24/23 1955 04/24/23 2226   04/24/23 2000  metroNIDAZOLE (FLAGYL) IVPB 500 mg        500 mg 100 mL/hr over 60 Minutes Intravenous  Once 04/24/23 1955 04/24/23 2349   04/24/23 2000  vancomycin (VANCOCIN) IVPB 1000 mg/200 mL premix        1,000 mg 200 mL/hr over 60 Minutes Intravenous  Once 04/24/23 1955 04/25/23 0013              Family Communication/Anticipated D/C date and plan/Code Status   DVT prophylaxis: SCDs Start: 04/24/23 2351     Code Status: Full Code  Family Communication: None Disposition Plan: Plan to discharge to group home   Status is: Inpatient Remains inpatient appropriate because: Left gluteal abscess, COVID-19 infection       Subjective:   She complains of cough.  No shortness of breath or chest pain.  No other complaints.  Objective:    Vitals:   04/28/23 0757 04/28/23 1941 04/29/23 0443 04/29/23 0814  BP: (!) 140/74 (!) 141/62 (!) 141/76 (!) 147/73  Pulse: 68 70 70 74  Resp: 16 20 18 16   Temp: 97.8 F (36.6 C) 97.9 F (36.6 C) 97.9 F (36.6 C) 97.9 F (36.6 C)  TempSrc: Oral Oral  Oral  SpO2: 98% 100% 95% 98%  Weight:      Height:       No data found.   Intake/Output Summary (Last 24 hours) at 04/29/2023 1458 Last data filed at 04/29/2023 0500 Gross per 24 hour  Intake --  Output 650 ml  Net -650 ml   Filed Weights   04/24/23 1841  Weight: 85.3 kg     Exam:  GEN: NAD SKIN: Left buttock wound EYES: EOMI ENT: MMM CV: RRR PULM: CTA B ABD: soft, ND, NT, +BS CNS: AAO x 3,  paraplegic EXT: No edema or tenderness    Data Reviewed:   I have personally reviewed following labs and imaging studies:  Labs: Labs show the following:   Basic Metabolic Panel: Recent Labs  Lab 04/24/23 2033 04/25/23 0317 04/27/23 1048 04/29/23 0946  NA 135 137  --  138  K 3.1* 3.7  --  3.7  CL 101 103  --  103  CO2 23 25  --  26  GLUCOSE 94 133*  --  86  BUN 16 11  --  17  CREATININE 0.53 0.48 0.53 0.45  CALCIUM 9.1 8.9  --  9.0  MG  --   --   --  2.2   GFR Estimated Creatinine Clearance: 85.9 mL/min (by C-G formula based on SCr of 0.45 mg/dL). Liver Function Tests: Recent Labs  Lab 04/24/23 2033  AST 15  ALT 11  ALKPHOS 56  BILITOT 0.8  PROT 8.7*  ALBUMIN 3.9   No results for input(s): "LIPASE", "AMYLASE" in the last 168 hours. No results for input(s): "AMMONIA" in the last 168 hours. Coagulation profile Recent Labs  Lab 04/25/23 0317  INR 1.3*    CBC: Recent Labs  Lab 04/24/23 2033 04/25/23 0317 04/26/23 0512 04/29/23 0946  WBC 11.9* 11.3* 9.4 12.3*  NEUTROABS 9.3*  --   --  8.3*  HGB 13.4 12.6 11.5* 14.2  HCT 44.0 38.0 35.1* 42.2  MCV 96.7 91.1 89.3 87.7  PLT 392 366 336 430*   Cardiac Enzymes: No results for input(s): "CKTOTAL", "CKMB", "CKMBINDEX", "TROPONINI" in the last 168 hours. BNP (last 3 results) No results for input(s): "PROBNP" in the last 8760 hours. CBG: Recent Labs  Lab 04/28/23 1246 04/28/23 1734 04/28/23 2232 04/29/23 0811 04/29/23 1226  GLUCAP 137* 124* 106* 92 90   D-Dimer: No results for input(s): "DDIMER" in the last 72 hours. Hgb A1c: No results for input(s): "HGBA1C" in the last 72 hours.  Lipid Profile: No results for input(s): "CHOL", "HDL", "LDLCALC", "TRIG", "CHOLHDL", "LDLDIRECT" in the last 72 hours. Thyroid function studies: No results for input(s): "TSH",  "T4TOTAL", "T3FREE", "THYROIDAB" in the last 72 hours.  Invalid input(s): "FREET3" Anemia work up: No results for input(s): "VITAMINB12", "FOLATE", "FERRITIN", "TIBC", "IRON", "RETICCTPCT" in the last 72 hours. Sepsis Labs: Recent Labs  Lab 04/24/23 2033 04/24/23 2130 04/25/23 0317 04/26/23 0512 04/29/23 0946  PROCALCITON  --   --   --  0.14  --   WBC 11.9*  --  11.3* 9.4 12.3*  LATICACIDVEN  --  0.6  --   --   --     Microbiology Recent Results (from the past 240 hour(s))  Resp panel by RT-PCR (RSV, Flu A&B, Covid) Abscess     Status: Abnormal   Collection Time: 04/24/23  9:09 PM   Specimen: Abscess; Nasal Swab  Result Value Ref Range Status   SARS Coronavirus 2 by RT PCR POSITIVE (A) NEGATIVE Final    Comment: (NOTE) SARS-CoV-2 target nucleic acids are DETECTED.  The SARS-CoV-2 RNA is generally detectable in upper respiratory specimens during the acute phase of infection. Positive results are indicative of the presence of the identified virus, but do not rule out bacterial infection or co-infection with other pathogens not detected by the test. Clinical correlation with patient history and other diagnostic information is necessary to determine patient infection status. The expected result is Negative.  Fact Sheet for Patients: BloggerCourse.com  Fact Sheet for Healthcare Providers: SeriousBroker.it  This test is not yet approved or  cleared by the [place] and  has been authorized for detection and/or diagnosis of SARS-CoV-2 by FDA under an Emergency Use Authorization (EUA).  This EUA will remain in effect (meaning this test can be used) for the duration of  the COVID-19 declaration under Section 564(b)(1) of the A ct, 21 U.S.C. section 360bbb-3(b)(1), unless the authorization is terminated or revoked sooner.     Influenza A by PCR NEGATIVE NEGATIVE Final   Influenza B by PCR NEGATIVE NEGATIVE Final     Comment: (NOTE) The Xpert Xpress SARS-CoV-2/FLU/RSV plus assay is intended as an aid in the diagnosis of influenza from Nasopharyngeal swab specimens and should not be used as a sole basis for treatment. Nasal washings and aspirates are unacceptable for Xpert Xpress SARS-CoV-2/FLU/RSV testing.  Fact Sheet for Patients: BloggerCourse.com  Fact Sheet for Healthcare Providers: SeriousBroker.it  This test is not yet approved or cleared by the [place] FDA and has been authorized for detection and/or diagnosis of SARS-CoV-2 by FDA under an Emergency Use Authorization (EUA). This EUA will remain in effect (meaning this test can be used) for the duration of the COVID-19 declaration under Section 564(b)(1) of the Act, 21 U.S.C. section 360bbb-3(b)(1), unless the authorization is terminated or revoked.     Resp Syncytial Virus by PCR NEGATIVE NEGATIVE Final    Comment: (NOTE) Fact Sheet for Patients: BloggerCourse.com  Fact Sheet for Healthcare Providers: SeriousBroker.it  This test is not yet approved or cleared by the [place] FDA and has been authorized for detection and/or diagnosis of SARS-CoV-2 by FDA under an Emergency Use Authorization (EUA). This EUA will remain in effect (meaning this test can be used) for the duration of the COVID-19 declaration under Section 564(b)(1) of the Act, 21 U.S.C. section 360bbb-3(b)(1), unless the authorization is terminated or revoked.  Performed at [facility], [address] [place], [place] [address]   Culture, blood (Routine x 2)     Status: None   Collection Time: 04/24/23  9:30 PM   Specimen: BLOOD  Result Value Ref Range Status   Specimen Description BLOOD BLOOD LEFT ARM  Final   Special Requests   Final    BOTTLES DRAWN AEROBIC AND ANAEROBIC Blood Culture results may not be optimal due to an inadequate volume of  blood received in culture bottles   Culture   Final    NO GROWTH 5 DAYS Performed at [facility], [address], [place], [place] [address]    Report Status 04/29/2023 FINAL  Final  Aerobic/Anaerobic Culture w Gram Stain (surgical/deep wound)     Status: None (Preliminary result)   Collection Time: 04/24/23 11:35 PM   Specimen: Abscess  Result Value Ref Range Status   Specimen Description   Final    ABSCESS Performed at [facility], [address] [place], [place] [address]    Special Requests   Final    NONE Performed at [facility], [address], [place], [place] [address]    Gram Stain   Final    RARE WBC PRESENT, PREDOMINANTLY PMN FEW GRAM POSITIVE COCCI Performed at [facility], [address], [place], [place] [address]    Culture   Final    MODERATE PROTEUS MIRABILIS MODERATE ESCHERICHIA COLI NO ANAEROBES ISOLATED; CULTURE IN PROGRESS FOR 5 DAYS    Report Status PENDING  Incomplete   Organism ID, Bacteria PROTEUS MIRABILIS  Final   Organism ID, Bacteria ESCHERICHIA COLI  Final  Susceptibility   Escherichia coli - MIC*    AMPICILLIN >=32 RESISTANT Resistant     CEFEPIME <=0.12 SENSITIVE Sensitive     CEFTAZIDIME <=1 SENSITIVE Sensitive     CEFTRIAXONE <=0.25 SENSITIVE Sensitive     CIPROFLOXACIN <=0.25 SENSITIVE Sensitive     GENTAMICIN <=1 SENSITIVE Sensitive     IMIPENEM <=0.25 SENSITIVE Sensitive     TRIMETH/SULFA <=20 SENSITIVE Sensitive     AMPICILLIN/SULBACTAM >=32 RESISTANT Resistant     PIP/TAZO <=4 SENSITIVE Sensitive     * MODERATE ESCHERICHIA COLI   Proteus mirabilis - MIC*    AMPICILLIN <=2 SENSITIVE Sensitive     CEFEPIME <=0.12 SENSITIVE Sensitive     CEFTAZIDIME <=1 SENSITIVE Sensitive     CEFTRIAXONE <=0.25 SENSITIVE Sensitive     CIPROFLOXACIN <=0.25 SENSITIVE Sensitive     GENTAMICIN <=1 SENSITIVE Sensitive     IMIPENEM 2 SENSITIVE Sensitive     TRIMETH/SULFA <=20 SENSITIVE Sensitive      AMPICILLIN/SULBACTAM <=2 SENSITIVE Sensitive     PIP/TAZO <=4 SENSITIVE Sensitive     * MODERATE PROTEUS MIRABILIS  Culture, blood (Routine x 2)     Status: None (Preliminary result)   Collection Time: 04/25/23  2:44 AM   Specimen: BLOOD  Result Value Ref Range Status   Specimen Description BLOOD LEFT HAND  Final   Special Requests   Final    BOTTLES DRAWN AEROBIC AND ANAEROBIC Blood Culture adequate volume   Culture   Final    NO GROWTH 4 DAYS Performed at [facility], [address] [place], [place] [address]    Report Status PENDING  Incomplete  Aerobic/Anaerobic Culture w Gram Stain (surgical/deep wound)     Status: None (Preliminary result)   Collection Time: 04/27/23  6:27 PM   Specimen: Buttocks; Abscess  Result Value Ref Range Status   Specimen Description   Final    BUTTOCKS Performed at [facility], [address], [place], [place] [address]    Special Requests   Final    NONE Performed at [facility], [address] [place], [place] [address]    Gram Stain   Final    NO WBC SEEN NO ORGANISMS SEEN Performed at [facility], [address] [place], [place] [address]    Culture   Final    RARE PSEUDOMONAS AERUGINOSA RARE PROTEUS MIRABILIS SUSCEPTIBILITIES TO FOLLOW NO ANAEROBES ISOLATED; CULTURE IN PROGRESS FOR 5 DAYS    Report Status PENDING  Incomplete    Procedures and diagnostic studies:  No results found.             LOS: 5 days      [facility]   Pager on [email or web address]. If 7PM-7AM, please contact night-coverage at [email or web address]     04/29/2023, 2:58 PM

## 2023-04-29 NOTE — Plan of Care (Signed)

## 2023-04-30 DIAGNOSIS — L0231 Cutaneous abscess of buttock: Secondary | ICD-10-CM | POA: Diagnosis not present

## 2023-04-30 LAB — GLUCOSE, CAPILLARY
Glucose-Capillary: 86 mg/dL (ref 70–99)
Glucose-Capillary: 130 mg/dL — ABNORMAL HIGH (ref 70–99)
Glucose-Capillary: 145 mg/dL — ABNORMAL HIGH (ref 70–99)
Glucose-Capillary: 149 mg/dL — ABNORMAL HIGH (ref 70–99)

## 2023-04-30 MED ORDER — SODIUM CHLORIDE 0.9 % IV SOLN
2.0000 g | Freq: Three times a day (TID) | INTRAVENOUS | Status: AC
  Administered 2023-04-30 – 2023-05-04 (×13): 2 g via INTRAVENOUS
  Filled 2023-04-30 (×13): qty 12.5

## 2023-04-30 NOTE — Plan of Care (Signed)

## 2023-04-30 NOTE — Progress Notes (Signed)
Progress Note    BRIDGIT Pratt  [name]:[id number] DOB: 1970/12/02  DOA: 04/24/2023 PCP: [name], MD      Brief Narrative:    Medical records reviewed and are as summarized below:  [name] is a 52 y.o. female  with medical history significant of multiple sclerosis, bedbound status, paraplegia, left upper extremity weakness, s/p urostomy, who presented to the hospital because of a wound on her left buttock that started about 2 weeks prior to admission.  She had been receiving local wound care but she noticed increasing drainage from the left buttock wound.  She developed a fever with temperature up to 101 F at home about 2 days prior to admission.  She also complained of congested cough.  She tested positive for COVID-19 infection and she was started on Paxlovid.  She was started on IV steroids in the emergency department because she was hypoxic (86%) on room air.  CT abdomen pelvis showed left gluteal abscess (2.5 x 1.5 cm) and possible early osteomyelitis of the left ischium.  She was treated with empiric IV antibiotics.    Assessment/Plan:   Principal Problem:   Left buttock abscess Active Problems:   COVID-19   Multiple sclerosis (HCC)   Obesity (BMI 30-39.9)   Neuropathic pain   Paraplegia (HCC)   Primary hypertension   Decubitus ulcer of sacral region, stage 3 (HCC)   History of ileal conduit   Gluteal abscess   Body mass index is 32.27 kg/m.   Sepsis secondary to COVID-19 infection and left buttock abscess, possible early osteomyelitis of the left ischium: S/p I&D of left gluteal abscess on 04/27/2023.   Gram stain from the left buttock wound (from 04/24/2023) showed few gram-positive cocci, and culture showed Proteus mirabilis and E. coli.  Wound culture from 04/27/2023 showed Pseudomonas aeruginosa. Change IV ceftriaxone to IV cefepime.  Continue oral Flagyl. Plan to complete 5 days of Paxlovid today.  Hycodan syrup as needed for cough She  completed 3 days of IV dexamethasone on 04/28/2023 She will follow-up with our outpatient wound care center for local wound care. Follow-up with ID for further recommendations   Acute hypoxic respiratory failure: Resolved.   No acute abnormality on chest x-ray.   Hypokalemia: Improved   Multiple sclerosis, paraplegia, neuropathic pain: Continue baclofen, gabapentin and Cymbalta.   History of ileal conduit, hypertension     Diet Order             Diet regular Room service appropriate? Yes; Fluid consistency: Thin  Diet effective now                            Consultants: ID specialist  Procedures: None    Medications:    amantadine  200 mg Oral BID   baclofen  20 mg Oral QID   cholecalciferol  1,000 Units Oral Daily   DULoxetine  60 mg Oral Daily   enoxaparin (LOVENOX) injection  0.5 mg/kg Subcutaneous Q24H   gabapentin  200 mg Oral QID   insulin aspart  0-15 Units Subcutaneous TID WC   insulin aspart  0-5 Units Subcutaneous QHS   metroNIDAZOLE  500 mg Oral Q12H   pantoprazole  40 mg Oral BID   potassium chloride  40 mEq Oral Daily   senna-docusate  2 tablet Oral BID   sodium chloride flush  3 mL Intravenous Q12H   Continuous Infusions:  ceFEPime (MAXIPIME) IV  Anti-infectives (From admission, onward)    Start     Dose/Rate Route Frequency Ordered Stop   04/30/23 2300  ceFEPIme (MAXIPIME) 2 g in sodium chloride 0.9 % 100 mL IVPB        2 g 200 mL/hr over 30 Minutes Intravenous Every 8 hours 04/30/23 1427     04/28/23 1000  vancomycin (VANCOREADY) IVPB 1500 mg/300 mL  Status:  Discontinued        1,500 mg 150 mL/hr over 120 Minutes Intravenous Every 24 hours 04/27/23 1637 04/28/23 1114   04/27/23 1530  cefTRIAXone (ROCEPHIN) 2 g in sodium chloride 0.9 % 100 mL IVPB  Status:  Discontinued        2 g 200 mL/hr over 30 Minutes Intravenous Every 24 hours 04/27/23 1423 04/30/23 1427   04/27/23 1515  metroNIDAZOLE (FLAGYL) tablet 500 mg         500 mg Oral Every 12 hours 04/27/23 1423     04/25/23 2300  vancomycin (VANCOREADY) IVPB 1250 mg/250 mL  Status:  Discontinued        1,250 mg 166.7 mL/hr over 90 Minutes Intravenous Every 24 hours 04/25/23 0228 04/25/23 0904   04/25/23 1200  vancomycin (VANCOCIN) IVPB 1000 mg/200 mL premix  Status:  Discontinued        1,000 mg 200 mL/hr over 60 Minutes Intravenous Every 12 hours 04/25/23 0904 04/27/23 1637   04/25/23 0230  nirmatrelvir/ritonavir (PAXLOVID) 3 tablet        3 tablet Oral 2 times daily 04/25/23 0219 04/29/23 2159   04/25/23 0215  vancomycin (VANCOCIN) IVPB 1000 mg/200 mL premix        1,000 mg 200 mL/hr over 60 Minutes Intravenous  Once 04/25/23 0209 04/25/23 0332   04/24/23 2000  ceFEPIme (MAXIPIME) 2 g in sodium chloride 0.9 % 100 mL IVPB        2 g 200 mL/hr over 30 Minutes Intravenous  Once 04/24/23 1955 04/24/23 2226   04/24/23 2000  metroNIDAZOLE (FLAGYL) IVPB 500 mg        500 mg 100 mL/hr over 60 Minutes Intravenous  Once 04/24/23 1955 04/24/23 2349   04/24/23 2000  vancomycin (VANCOCIN) IVPB 1000 mg/200 mL premix        1,000 mg 200 mL/hr over 60 Minutes Intravenous  Once 04/24/23 1955 04/25/23 0013              Family Communication/Anticipated D/C date and plan/Code Status   DVT prophylaxis: SCDs Start: 04/24/23 2351     Code Status: Full Code  Family Communication: None Disposition Plan: Plan to discharge to group home   Status is: Inpatient Remains inpatient appropriate because: Left gluteal abscess, COVID-19 infection       Subjective:   Interval events noted.  She still has a cough but it's better.  She was able to get some sleep last night.  Objective:    Vitals:   04/29/23 0814 04/29/23 1719 04/30/23 0531 04/30/23 0809  BP: (!) 147/73 136/79 136/79 118/64  Pulse: 74 72 75 71  Resp: 16 20 18 18   Temp: 97.9 F (36.6 C) 98 F (36.7 C) 97.8 F (36.6 C) 98 F (36.7 C)  TempSrc: Oral  Oral Oral  SpO2: 98% 96% 97% 99%   Weight:      Height:       No data found.   Intake/Output Summary (Last 24 hours) at 04/30/2023 1446 Last data filed at 04/30/2023 1416 Gross per 24 hour  Intake 560 ml  Output 500 ml  Net 60 ml   Filed Weights   04/24/23 1841  Weight: 85.3 kg    Exam:  GEN: NAD SKIN: Warm and dry.  No drainage from left buttock wound EYES: EOMI ENT: MMM CV: RRR PULM: CTA B ABD: soft, ND, NT, +BS CNS: AAO x 3, paraplegic EXT: No edema or tenderness       Data Reviewed:   I have personally reviewed following labs and imaging studies:  Labs: Labs show the following:   Basic Metabolic Panel: Recent Labs  Lab 04/24/23 2033 04/25/23 0317 04/27/23 1048 04/29/23 0946  NA 135 137  --  138  K 3.1* 3.7  --  3.7  CL 101 103  --  103  CO2 23 25  --  26  GLUCOSE 94 133*  --  86  BUN 16 11  --  17  CREATININE 0.53 0.48 0.53 0.45  CALCIUM 9.1 8.9  --  9.0  MG  --   --   --  2.2   GFR Estimated Creatinine Clearance: 85.9 mL/min (by C-G formula based on SCr of 0.45 mg/dL). Liver Function Tests: Recent Labs  Lab 04/24/23 2033  AST 15  ALT 11  ALKPHOS 56  BILITOT 0.8  PROT 8.7*  ALBUMIN 3.9   No results for input(s): "LIPASE", "AMYLASE" in the last 168 hours. No results for input(s): "AMMONIA" in the last 168 hours. Coagulation profile Recent Labs  Lab 04/25/23 0317  INR 1.3*    CBC: Recent Labs  Lab 04/24/23 2033 04/25/23 0317 04/26/23 0512 04/29/23 0946  WBC 11.9* 11.3* 9.4 12.3*  NEUTROABS 9.3*  --   --  8.3*  HGB 13.4 12.6 11.5* 14.2  HCT 44.0 38.0 35.1* 42.2  MCV 96.7 91.1 89.3 87.7  PLT 392 366 336 430*   Cardiac Enzymes: No results for input(s): "CKTOTAL", "CKMB", "CKMBINDEX", "TROPONINI" in the last 168 hours. BNP (last 3 results) No results for input(s): "PROBNP" in the last 8760 hours. CBG: Recent Labs  Lab 04/29/23 1226 04/29/23 1718 04/29/23 2122 04/30/23 0833 04/30/23 1147  GLUCAP 90 99 122* 86 130*   D-Dimer: No results for  input(s): "DDIMER" in the last 72 hours. Hgb A1c: No results for input(s): "HGBA1C" in the last 72 hours.  Lipid Profile: No results for input(s): "CHOL", "HDL", "LDLCALC", "TRIG", "CHOLHDL", "LDLDIRECT" in the last 72 hours. Thyroid function studies: No results for input(s): "TSH", "T4TOTAL", "T3FREE", "THYROIDAB" in the last 72 hours.  Invalid input(s): "FREET3" Anemia work up: No results for input(s): "VITAMINB12", "FOLATE", "FERRITIN", "TIBC", "IRON", "RETICCTPCT" in the last 72 hours. Sepsis Labs: Recent Labs  Lab 04/24/23 2033 04/24/23 2130 04/25/23 0317 04/26/23 0512 04/29/23 0946  PROCALCITON  --   --   --  0.14  --   WBC 11.9*  --  11.3* 9.4 12.3*  LATICACIDVEN  --  0.6  --   --   --     Microbiology Recent Results (from the past 240 hour(s))  Resp panel by RT-PCR (RSV, Flu A&B, Covid) Abscess     Status: Abnormal   Collection Time: 04/24/23  9:09 PM   Specimen: Abscess; Nasal Swab  Result Value Ref Range Status   SARS Coronavirus 2 by RT PCR POSITIVE (A) NEGATIVE Final    Comment: (NOTE) SARS-CoV-2 target nucleic acids are DETECTED.  The SARS-CoV-2 RNA is generally detectable in upper respiratory specimens during the acute phase of infection. Positive results are indicative of the presence of the identified virus, but  do not rule out bacterial infection or co-infection with other pathogens not detected by the test. Clinical correlation with patient history and other diagnostic information is necessary to determine patient infection status. The expected result is Negative.  Fact Sheet for Patients: BloggerCourse.com  Fact Sheet for Healthcare Providers: SeriousBroker.it  This test is not yet approved or cleared by the [place] FDA and  has been authorized for detection and/or diagnosis of SARS-CoV-2 by FDA under an Emergency Use Authorization (EUA).  This EUA will remain in effect (meaning this test can  be used) for the duration of  the COVID-19 declaration under Section 564(b)(1) of the A ct, 21 U.S.C. section 360bbb-3(b)(1), unless the authorization is terminated or revoked sooner.     Influenza A by PCR NEGATIVE NEGATIVE Final   Influenza B by PCR NEGATIVE NEGATIVE Final    Comment: (NOTE) The Xpert Xpress SARS-CoV-2/FLU/RSV plus assay is intended as an aid in the diagnosis of influenza from Nasopharyngeal swab specimens and should not be used as a sole basis for treatment. Nasal washings and aspirates are unacceptable for Xpert Xpress SARS-CoV-2/FLU/RSV testing.  Fact Sheet for Patients: BloggerCourse.com  Fact Sheet for Healthcare Providers: SeriousBroker.it  This test is not yet approved or cleared by the [place] FDA and has been authorized for detection and/or diagnosis of SARS-CoV-2 by FDA under an Emergency Use Authorization (EUA). This EUA will remain in effect (meaning this test can be used) for the duration of the COVID-19 declaration under Section 564(b)(1) of the Act, 21 U.S.C. section 360bbb-3(b)(1), unless the authorization is terminated or revoked.     Resp Syncytial Virus by PCR NEGATIVE NEGATIVE Final    Comment: (NOTE) Fact Sheet for Patients: BloggerCourse.com  Fact Sheet for Healthcare Providers: SeriousBroker.it  This test is not yet approved or cleared by the [place] FDA and has been authorized for detection and/or diagnosis of SARS-CoV-2 by FDA under an Emergency Use Authorization (EUA). This EUA will remain in effect (meaning this test can be used) for the duration of the COVID-19 declaration under Section 564(b)(1) of the Act, 21 U.S.C. section 360bbb-3(b)(1), unless the authorization is terminated or revoked.  Performed at [facility], [address] [place], [place] [address]   Culture, blood (Routine x 2)     Status:  None   Collection Time: 04/24/23  9:30 PM   Specimen: BLOOD  Result Value Ref Range Status   Specimen Description BLOOD BLOOD LEFT ARM  Final   Special Requests   Final    BOTTLES DRAWN AEROBIC AND ANAEROBIC Blood Culture results may not be optimal due to an inadequate volume of blood received in culture bottles   Culture   Final    NO GROWTH 5 DAYS Performed at [facility], [address] [place], [place] [address]    Report Status 04/29/2023 FINAL  Final  Aerobic/Anaerobic Culture w Gram Stain (surgical/deep wound)     Status: None (Preliminary result)   Collection Time: 04/24/23 11:35 PM   Specimen: Abscess  Result Value Ref Range Status   Specimen Description   Final    ABSCESS Performed at [facility], [address], [place], [place] [address]    Special Requests   Final    NONE Performed at [facility], [address] [place], [place] [address]    Gram Stain   Final    RARE WBC PRESENT, PREDOMINANTLY PMN FEW GRAM POSITIVE COCCI Performed at [facility], [address], [place], [place] [address]    Culture   Final    MODERATE PROTEUS MIRABILIS MODERATE ESCHERICHIA COLI NO ANAEROBES ISOLATED; CULTURE IN PROGRESS FOR 5 DAYS    Report Status PENDING  Incomplete   Organism ID, Bacteria PROTEUS MIRABILIS  Final   Organism ID, Bacteria ESCHERICHIA COLI  Final      Susceptibility   Escherichia coli - MIC*    AMPICILLIN >=32 RESISTANT Resistant     CEFEPIME <=0.12 SENSITIVE Sensitive     CEFTAZIDIME <=1 SENSITIVE Sensitive     CEFTRIAXONE <=0.25 SENSITIVE Sensitive     CIPROFLOXACIN <=0.25 SENSITIVE Sensitive     GENTAMICIN <=1 SENSITIVE Sensitive     IMIPENEM <=0.25 SENSITIVE Sensitive     TRIMETH/SULFA <=20 SENSITIVE Sensitive     AMPICILLIN/SULBACTAM >=32 RESISTANT Resistant     PIP/TAZO <=4 SENSITIVE Sensitive     * MODERATE ESCHERICHIA COLI   Proteus mirabilis - MIC*    AMPICILLIN <=2 SENSITIVE Sensitive     CEFEPIME  <=0.12 SENSITIVE Sensitive     CEFTAZIDIME <=1 SENSITIVE Sensitive     CEFTRIAXONE <=0.25 SENSITIVE Sensitive     CIPROFLOXACIN <=0.25 SENSITIVE Sensitive     GENTAMICIN <=1 SENSITIVE Sensitive     IMIPENEM 2 SENSITIVE Sensitive     TRIMETH/SULFA <=20 SENSITIVE Sensitive     AMPICILLIN/SULBACTAM <=2 SENSITIVE Sensitive     PIP/TAZO <=4 SENSITIVE Sensitive     * MODERATE PROTEUS MIRABILIS  Culture, blood (Routine x 2)     Status: None   Collection Time: 04/25/23  2:44 AM   Specimen: BLOOD  Result Value Ref Range Status   Specimen Description BLOOD LEFT HAND  Final   Special Requests   Final    BOTTLES DRAWN AEROBIC AND ANAEROBIC Blood Culture adequate volume   Culture   Final    NO GROWTH 5 DAYS Performed at [facility], [address] [place], [place] [address]    Report Status 04/30/2023 FINAL  Final  Aerobic/Anaerobic Culture w Gram Stain (surgical/deep wound)     Status: None (Preliminary result)   Collection Time: 04/27/23  6:27 PM   Specimen: Buttocks; Abscess  Result Value Ref Range Status   Specimen Description   Final    BUTTOCKS Performed at [facility], [address], [place], [place] [address]    Special Requests   Final    NONE Performed at [facility], [address] [place], [place] [address]    Gram Stain NO WBC SEEN NO ORGANISMS SEEN   Final   Culture   Final    RARE PSEUDOMONAS AERUGINOSA RARE PROTEUS MIRABILIS NO ANAEROBES ISOLATED; CULTURE IN PROGRESS FOR 5 DAYS CULTURE REINCUBATED FOR BETTER GROWTH Performed at [facility], [address], [place], [place] [address]    Report Status PENDING  Incomplete   Organism ID, Bacteria PSEUDOMONAS AERUGINOSA  Final   Organism ID, Bacteria PROTEUS MIRABILIS  Final      Susceptibility   Pseudomonas aeruginosa - MIC*    CEFTAZIDIME 4 SENSITIVE Sensitive     CIPROFLOXACIN <=0.25 SENSITIVE Sensitive     GENTAMICIN 2 SENSITIVE Sensitive     IMIPENEM 2 SENSITIVE  Sensitive     PIP/TAZO 16 SENSITIVE Sensitive     CEFEPIME 2 SENSITIVE Sensitive     * RARE PSEUDOMONAS AERUGINOSA   Proteus mirabilis - MIC*    AMPICILLIN <=2 SENSITIVE Sensitive     CEFEPIME <=0.12 SENSITIVE Sensitive     CEFTAZIDIME <=1 SENSITIVE  Sensitive     CEFTRIAXONE <=0.25 SENSITIVE Sensitive     CIPROFLOXACIN <=0.25 SENSITIVE Sensitive     GENTAMICIN <=1 SENSITIVE Sensitive     IMIPENEM 2 SENSITIVE Sensitive     TRIMETH/SULFA <=20 SENSITIVE Sensitive     AMPICILLIN/SULBACTAM <=2 SENSITIVE Sensitive     PIP/TAZO <=4 SENSITIVE Sensitive     * RARE PROTEUS MIRABILIS    Procedures and diagnostic studies:  No results found.             LOS: 6 days      [facility]   Pager on [email or web address]. If 7PM-7AM, please contact night-coverage at [email or web address]     04/30/2023, 2:46 PM

## 2023-05-01 DIAGNOSIS — L0231 Cutaneous abscess of buttock: Secondary | ICD-10-CM | POA: Diagnosis not present

## 2023-05-01 LAB — GLUCOSE, CAPILLARY
Glucose-Capillary: 105 mg/dL — ABNORMAL HIGH (ref 70–99)
Glucose-Capillary: 115 mg/dL — ABNORMAL HIGH (ref 70–99)
Glucose-Capillary: 112 mg/dL — ABNORMAL HIGH (ref 70–99)
Glucose-Capillary: 115 mg/dL — ABNORMAL HIGH (ref 70–99)

## 2023-05-01 NOTE — Plan of Care (Signed)

## 2023-05-01 NOTE — Progress Notes (Signed)
Progress Note    Samantha MOSELY  Pratt:[id number] DOB: 19-Jul-1971  DOA: 04/24/2023 PCP: [name], MD      Brief Narrative:    Medical records reviewed and are as summarized below:  [name] is a 52 y.o. female  with medical history significant of multiple sclerosis, bedbound status, paraplegia, left upper extremity weakness, s/p urostomy, who presented to the hospital because of a wound on her left buttock that started about 2 weeks prior to admission.  She had been receiving local wound care but she noticed increasing drainage from the left buttock wound.  She developed a fever with temperature up to 101 F at home about 2 days prior to admission.  She also complained of congested cough.  She tested positive for COVID-19 infection and she was started on Paxlovid.  She was started on IV steroids in the emergency department because she was hypoxic (86%) on room air.  CT abdomen pelvis showed left gluteal abscess (2.5 x 1.5 cm) and possible early osteomyelitis of the left ischium.  She was treated with empiric IV antibiotics.    Assessment/Plan:   Principal Problem:   Left buttock abscess Active Problems:   COVID-19   Multiple sclerosis (HCC)   Obesity (BMI 30-39.9)   Neuropathic pain   Paraplegia (HCC)   Primary hypertension   Decubitus ulcer of sacral region, stage 3 (HCC)   History of ileal conduit   Gluteal abscess   Body mass index is 32.27 kg/m.   Sepsis secondary to COVID-19 infection and left buttock abscess, possible early osteomyelitis of the left ischium: S/p I&D of left gluteal abscess on 04/27/2023.   Gram stain from the left buttock wound (from 04/24/2023) showed few gram-positive cocci, and culture showed Proteus mirabilis and E. coli.  Wound culture from 04/27/2023 showed Pseudomonas aeruginosa. Continue IV cefepime and oral Flagyl. Completed 5-day course of Paxlovid on 04/29/2023.  She completed 3 days of IV dexamethasone on 04/28/2023 Hycodan  as needed for cough. She will follow-up with our outpatient wound care center for local wound care. Follow-up with ID for further recommendations   Acute hypoxic respiratory failure: Resolved.   No acute abnormality on chest x-ray.   Hypokalemia: Improved.  Continue potassium repletion.  Repeat BMP tomorrow.   Multiple sclerosis, paraplegia, neuropathic pain: Continue baclofen, gabapentin and Cymbalta.   History of ileal conduit, hypertension     Diet Order             Diet regular Room service appropriate? Yes; Fluid consistency: Thin  Diet effective now                            Consultants: ID specialist  Procedures: None    Medications:    amantadine  200 mg Oral BID   baclofen  20 mg Oral QID   cholecalciferol  1,000 Units Oral Daily   DULoxetine  60 mg Oral Daily   enoxaparin (LOVENOX) injection  0.5 mg/kg Subcutaneous Q24H   gabapentin  200 mg Oral QID   insulin aspart  0-15 Units Subcutaneous TID WC   insulin aspart  0-5 Units Subcutaneous QHS   metroNIDAZOLE  500 mg Oral Q12H   pantoprazole  40 mg Oral BID   potassium chloride  40 mEq Oral Daily   senna-docusate  2 tablet Oral BID   sodium chloride flush  3 mL Intravenous Q12H   Continuous Infusions:  ceFEPime (MAXIPIME) IV  2 g (05/01/23 0750)     Anti-infectives (From admission, onward)    Start     Dose/Rate Route Frequency Ordered Stop   04/30/23 2300  ceFEPIme (MAXIPIME) 2 g in sodium chloride 0.9 % 100 mL IVPB        2 g 200 mL/hr over 30 Minutes Intravenous Every 8 hours 04/30/23 1427     04/28/23 1000  vancomycin (VANCOREADY) IVPB 1500 mg/300 mL  Status:  Discontinued        1,500 mg 150 mL/hr over 120 Minutes Intravenous Every 24 hours 04/27/23 1637 04/28/23 1114   04/27/23 1530  cefTRIAXone (ROCEPHIN) 2 g in sodium chloride 0.9 % 100 mL IVPB  Status:  Discontinued        2 g 200 mL/hr over 30 Minutes Intravenous Every 24 hours 04/27/23 1423 04/30/23 1427   04/27/23  1515  metroNIDAZOLE (FLAGYL) tablet 500 mg        500 mg Oral Every 12 hours 04/27/23 1423     04/25/23 2300  vancomycin (VANCOREADY) IVPB 1250 mg/250 mL  Status:  Discontinued        1,250 mg 166.7 mL/hr over 90 Minutes Intravenous Every 24 hours 04/25/23 0228 04/25/23 0904   04/25/23 1200  vancomycin (VANCOCIN) IVPB 1000 mg/200 mL premix  Status:  Discontinued        1,000 mg 200 mL/hr over 60 Minutes Intravenous Every 12 hours 04/25/23 0904 04/27/23 1637   04/25/23 0230  nirmatrelvir/ritonavir (PAXLOVID) 3 tablet        3 tablet Oral 2 times daily 04/25/23 0219 04/29/23 2159   04/25/23 0215  vancomycin (VANCOCIN) IVPB 1000 mg/200 mL premix        1,000 mg 200 mL/hr over 60 Minutes Intravenous  Once 04/25/23 0209 04/25/23 0332   04/24/23 2000  ceFEPIme (MAXIPIME) 2 g in sodium chloride 0.9 % 100 mL IVPB        2 g 200 mL/hr over 30 Minutes Intravenous  Once 04/24/23 1955 04/24/23 2226   04/24/23 2000  metroNIDAZOLE (FLAGYL) IVPB 500 mg        500 mg 100 mL/hr over 60 Minutes Intravenous  Once 04/24/23 1955 04/24/23 2349   04/24/23 2000  vancomycin (VANCOCIN) IVPB 1000 mg/200 mL premix        1,000 mg 200 mL/hr over 60 Minutes Intravenous  Once 04/24/23 1955 04/25/23 0013              Family Communication/Anticipated D/C date and plan/Code Status   DVT prophylaxis: SCDs Start: 04/24/23 2351     Code Status: Full Code  Family Communication: None Disposition Plan: Plan to discharge to group home   Status is: Inpatient Remains inpatient appropriate because: Left gluteal abscess, COVID-19 infection       Subjective:   She complains of congested cough.  She was able to get some sleep because she had Hycodan cough syrup.  She has more pain in the left buttock.  Objective:    Vitals:   04/30/23 1714 04/30/23 2006 05/01/23 0540 05/01/23 0747  BP: 119/80 123/76 136/62 130/75  Pulse: (!) 102 81 93 96  Resp: 18 18 16 18   Temp:  98.8 F (37.1 C) 98.7 F (37.1 C)    TempSrc:   Oral   SpO2: 96% 99% 98% 97%  Weight:      Height:       No data found.   Intake/Output Summary (Last 24 hours) at 05/01/2023 0931 Last data filed at 05/01/2023 [phone]  Gross per 24 hour  Intake 920 ml  Output 550 ml  Net 370 ml   Filed Weights   04/24/23 1841  Weight: 85.3 kg    Exam:  GEN: NAD SKIN: Left buttock wound EYES: No pallor or icterus ENT: MMM CV: RRR PULM: Rattling in the lungs ABD: soft, ND, NT, +BS CNS: AAO x 3, paraplegia EXT: No edema or tenderness    Data Reviewed:   I have personally reviewed following labs and imaging studies:  Labs: Labs show the following:   Basic Metabolic Panel: Recent Labs  Lab 04/24/23 2033 04/25/23 0317 04/27/23 1048 04/29/23 0946 05/01/23 0635  NA 135 137  --  138  --   K 3.1* 3.7  --  3.7  --   CL 101 103  --  103  --   CO2 23 25  --  26  --   GLUCOSE 94 133*  --  86  --   BUN 16 11  --  17  --   CREATININE 0.53 0.48 0.53 0.45 0.57  CALCIUM 9.1 8.9  --  9.0  --   MG  --   --   --  2.2  --    GFR Estimated Creatinine Clearance: 85.9 mL/min (by C-G formula based on SCr of 0.57 mg/dL). Liver Function Tests: Recent Labs  Lab 04/24/23 2033  AST 15  ALT 11  ALKPHOS 56  BILITOT 0.8  PROT 8.7*  ALBUMIN 3.9   No results for input(s): "LIPASE", "AMYLASE" in the last 168 hours. No results for input(s): "AMMONIA" in the last 168 hours. Coagulation profile Recent Labs  Lab 04/25/23 0317  INR 1.3*    CBC: Recent Labs  Lab 04/24/23 2033 04/25/23 0317 04/26/23 0512 04/29/23 0946  WBC 11.9* 11.3* 9.4 12.3*  NEUTROABS 9.3*  --   --  8.3*  HGB 13.4 12.6 11.5* 14.2  HCT 44.0 38.0 35.1* 42.2  MCV 96.7 91.1 89.3 87.7  PLT 392 366 336 430*   Cardiac Enzymes: No results for input(s): "CKTOTAL", "CKMB", "CKMBINDEX", "TROPONINI" in the last 168 hours. BNP (last 3 results) No results for input(s): "PROBNP" in the last 8760 hours. CBG: Recent Labs  Lab 04/30/23 0833 04/30/23 1147  04/30/23 1534 04/30/23 2005 05/01/23 0840  GLUCAP 86 130* 145* 149* 105*   D-Dimer: No results for input(s): "DDIMER" in the last 72 hours. Hgb A1c: No results for input(s): "HGBA1C" in the last 72 hours.  Lipid Profile: No results for input(s): "CHOL", "HDL", "LDLCALC", "TRIG", "CHOLHDL", "LDLDIRECT" in the last 72 hours. Thyroid function studies: No results for input(s): "TSH", "T4TOTAL", "T3FREE", "THYROIDAB" in the last 72 hours.  Invalid input(s): "FREET3" Anemia work up: No results for input(s): "VITAMINB12", "FOLATE", "FERRITIN", "TIBC", "IRON", "RETICCTPCT" in the last 72 hours. Sepsis Labs: Recent Labs  Lab 04/24/23 2033 04/24/23 2130 04/25/23 0317 04/26/23 0512 04/29/23 0946  PROCALCITON  --   --   --  0.14  --   WBC 11.9*  --  11.3* 9.4 12.3*  LATICACIDVEN  --  0.6  --   --   --     Microbiology Recent Results (from the past 240 hour(s))  Resp panel by RT-PCR (RSV, Flu A&B, Covid) Abscess     Status: Abnormal   Collection Time: 04/24/23  9:09 PM   Specimen: Abscess; Nasal Swab  Result Value Ref Range Status   SARS Coronavirus 2 by RT PCR POSITIVE (A) NEGATIVE Final    Comment: (NOTE) SARS-CoV-2 target nucleic acids  are DETECTED.  The SARS-CoV-2 RNA is generally detectable in upper respiratory specimens during the acute phase of infection. Positive results are indicative of the presence of the identified virus, but do not rule out bacterial infection or co-infection with other pathogens not detected by the test. Clinical correlation with patient history and other diagnostic information is necessary to determine patient infection status. The expected result is Negative.  Fact Sheet for Patients: BloggerCourse.com  Fact Sheet for Healthcare Providers: SeriousBroker.it  This test is not yet approved or cleared by the [place] FDA and  has been authorized for detection and/or diagnosis of SARS-CoV-2  by FDA under an Emergency Use Authorization (EUA).  This EUA will remain in effect (meaning this test can be used) for the duration of  the COVID-19 declaration under Section 564(b)(1) of the A ct, 21 U.S.C. section 360bbb-3(b)(1), unless the authorization is terminated or revoked sooner.     Influenza A by PCR NEGATIVE NEGATIVE Final   Influenza B by PCR NEGATIVE NEGATIVE Final    Comment: (NOTE) The Xpert Xpress SARS-CoV-2/FLU/RSV plus assay is intended as an aid in the diagnosis of influenza from Nasopharyngeal swab specimens and should not be used as a sole basis for treatment. Nasal washings and aspirates are unacceptable for Xpert Xpress SARS-CoV-2/FLU/RSV testing.  Fact Sheet for Patients: BloggerCourse.com  Fact Sheet for Healthcare Providers: SeriousBroker.it  This test is not yet approved or cleared by the [place] FDA and has been authorized for detection and/or diagnosis of SARS-CoV-2 by FDA under an Emergency Use Authorization (EUA). This EUA will remain in effect (meaning this test can be used) for the duration of the COVID-19 declaration under Section 564(b)(1) of the Act, 21 U.S.C. section 360bbb-3(b)(1), unless the authorization is terminated or revoked.     Resp Syncytial Virus by PCR NEGATIVE NEGATIVE Final    Comment: (NOTE) Fact Sheet for Patients: BloggerCourse.com  Fact Sheet for Healthcare Providers: SeriousBroker.it  This test is not yet approved or cleared by the [place] FDA and has been authorized for detection and/or diagnosis of SARS-CoV-2 by FDA under an Emergency Use Authorization (EUA). This EUA will remain in effect (meaning this test can be used) for the duration of the COVID-19 declaration under Section 564(b)(1) of the Act, 21 U.S.C. section 360bbb-3(b)(1), unless the authorization is terminated or revoked.  Performed at  [facility], [address] [place], [place] [address]   Culture, blood (Routine x 2)     Status: None   Collection Time: 04/24/23  9:30 PM   Specimen: BLOOD  Result Value Ref Range Status   Specimen Description BLOOD BLOOD LEFT ARM  Final   Special Requests   Final    BOTTLES DRAWN AEROBIC AND ANAEROBIC Blood Culture results may not be optimal due to an inadequate volume of blood received in culture bottles   Culture   Final    NO GROWTH 5 DAYS Performed at [facility], [address] [place], [place] [address]    Report Status 04/29/2023 FINAL  Final  Aerobic/Anaerobic Culture w Gram Stain (surgical/deep wound)     Status: None (Preliminary result)   Collection Time: 04/24/23 11:35 PM   Specimen: Abscess  Result Value Ref Range Status   Specimen Description   Final    ABSCESS Performed at [facility], [address] [place], [place] [address]    Special Requests   Final    NONE Performed at [facility], [address] [place],  [place] [address]    Gram Stain   Final    RARE WBC PRESENT, PREDOMINANTLY PMN FEW GRAM POSITIVE COCCI    Culture   Final    MODERATE PROTEUS MIRABILIS MODERATE ESCHERICHIA COLI HOLDING FOR POSSIBLE ANAEROBE Performed at [facility], [address], [place], [place] [address]    Report Status PENDING  Incomplete   Organism ID, Bacteria PROTEUS MIRABILIS  Final   Organism ID, Bacteria ESCHERICHIA COLI  Final      Susceptibility   Escherichia coli - MIC*    AMPICILLIN >=32 RESISTANT Resistant     CEFEPIME <=0.12 SENSITIVE Sensitive     CEFTAZIDIME <=1 SENSITIVE Sensitive     CEFTRIAXONE <=0.25 SENSITIVE Sensitive     CIPROFLOXACIN <=0.25 SENSITIVE Sensitive     GENTAMICIN <=1 SENSITIVE Sensitive     IMIPENEM <=0.25 SENSITIVE Sensitive     TRIMETH/SULFA <=20 SENSITIVE Sensitive     AMPICILLIN/SULBACTAM >=32 RESISTANT Resistant     PIP/TAZO <=4 SENSITIVE Sensitive     * MODERATE ESCHERICHIA  COLI   Proteus mirabilis - MIC*    AMPICILLIN <=2 SENSITIVE Sensitive     CEFEPIME <=0.12 SENSITIVE Sensitive     CEFTAZIDIME <=1 SENSITIVE Sensitive     CEFTRIAXONE <=0.25 SENSITIVE Sensitive     CIPROFLOXACIN <=0.25 SENSITIVE Sensitive     GENTAMICIN <=1 SENSITIVE Sensitive     IMIPENEM 2 SENSITIVE Sensitive     TRIMETH/SULFA <=20 SENSITIVE Sensitive     AMPICILLIN/SULBACTAM <=2 SENSITIVE Sensitive     PIP/TAZO <=4 SENSITIVE Sensitive     * MODERATE PROTEUS MIRABILIS  Culture, blood (Routine x 2)     Status: None   Collection Time: 04/25/23  2:44 AM   Specimen: BLOOD  Result Value Ref Range Status   Specimen Description BLOOD LEFT HAND  Final   Special Requests   Final    BOTTLES DRAWN AEROBIC AND ANAEROBIC Blood Culture adequate volume   Culture   Final    NO GROWTH 5 DAYS Performed at [facility], [address], [place], [place] [address]    Report Status 04/30/2023 FINAL  Final  Aerobic/Anaerobic Culture w Gram Stain (surgical/deep wound)     Status: None (Preliminary result)   Collection Time: 04/27/23  6:27 PM   Specimen: Buttocks; Abscess  Result Value Ref Range Status   Specimen Description   Final    BUTTOCKS Performed at [facility], [address], [place], [place] [address]    Special Requests   Final    NONE Performed at [facility], [address] [place], [place] [address]    Gram Stain NO WBC SEEN NO ORGANISMS SEEN   Final   Culture   Final    RARE PSEUDOMONAS AERUGINOSA RARE PROTEUS MIRABILIS NO ANAEROBES ISOLATED; CULTURE IN PROGRESS FOR 5 DAYS CULTURE REINCUBATED FOR BETTER GROWTH Performed at [facility], [address], [place], [place] [address]    Report Status PENDING  Incomplete   Organism ID, Bacteria PSEUDOMONAS AERUGINOSA  Final   Organism ID, Bacteria PROTEUS MIRABILIS  Final      Susceptibility   Pseudomonas aeruginosa - MIC*    CEFTAZIDIME 4 SENSITIVE Sensitive     CIPROFLOXACIN <=0.25  SENSITIVE Sensitive     GENTAMICIN 2 SENSITIVE Sensitive     IMIPENEM 2 SENSITIVE Sensitive     PIP/TAZO 16 SENSITIVE Sensitive     CEFEPIME 2 SENSITIVE Sensitive     * RARE PSEUDOMONAS AERUGINOSA  Proteus mirabilis - MIC*    AMPICILLIN <=2 SENSITIVE Sensitive     CEFEPIME <=0.12 SENSITIVE Sensitive     CEFTAZIDIME <=1 SENSITIVE Sensitive     CEFTRIAXONE <=0.25 SENSITIVE Sensitive     CIPROFLOXACIN <=0.25 SENSITIVE Sensitive     GENTAMICIN <=1 SENSITIVE Sensitive     IMIPENEM 2 SENSITIVE Sensitive     TRIMETH/SULFA <=20 SENSITIVE Sensitive     AMPICILLIN/SULBACTAM <=2 SENSITIVE Sensitive     PIP/TAZO <=4 SENSITIVE Sensitive     * RARE PROTEUS MIRABILIS    Procedures and diagnostic studies:  No results found.             LOS: 7 days      [facility]   Pager on [email or web address]. If 7PM-7AM, please contact night-coverage at [email or web address]     05/01/2023, 9:31 AM

## 2023-05-01 NOTE — Plan of Care (Signed)
Pt is alert and oriented, VSS, denies c/o pain. Pt receiving IV antibiotics for abscess to buttocks and congestion  and is on Airborne Isolation for positive COVID. Pt is bed bound d/t MS and bilat LE paralysis.  Pt being repositioned and turned Q2 hrs. Bed is low, locked and call light in reach.  Problem: Education: Goal: Ability to describe self-care measures that may prevent or decrease complications (Diabetes Survival Skills Education) will improve Outcome: Not Progressing Goal: Individualized Educational Video(s) Outcome: Not Progressing   Problem: Coping: Goal: Ability to adjust to condition or change in health will improve Outcome: Not Progressing   Problem: Fluid Volume: Goal: Ability to maintain a balanced intake and output will improve Outcome: Not Progressing   Problem: Health Behavior/Discharge Planning: Goal: Ability to identify and utilize available resources and services will improve Outcome: Not Progressing Goal: Ability to manage health-related needs will improve Outcome: Not Progressing   Problem: Metabolic: Goal: Ability to maintain appropriate glucose levels will improve Outcome: Not Progressing   Problem: Nutritional: Goal: Maintenance of adequate nutrition will improve Outcome: Not Progressing Goal: Progress toward achieving an optimal weight will improve Outcome: Not Progressing   Problem: Skin Integrity: Goal: Risk for impaired skin integrity will decrease Outcome: Not Progressing   Problem: Tissue Perfusion: Goal: Adequacy of tissue perfusion will improve Outcome: Not Progressing   Problem: Education: Goal: Knowledge of General Education information will improve Description: Including pain rating scale, medication(s)/side effects and non-pharmacologic comfort measures Outcome: Not Progressing   Problem: Health Behavior/Discharge Planning: Goal: Ability to manage health-related needs will improve Outcome: Not Progressing   Problem: Clinical  Measurements: Goal: Ability to maintain clinical measurements within normal limits will improve Outcome: Not Progressing Goal: Will remain free from infection Outcome: Not Progressing Goal: Diagnostic test results will improve Outcome: Not Progressing Goal: Respiratory complications will improve Outcome: Not Progressing Goal: Cardiovascular complication will be avoided Outcome: Not Progressing   Problem: Activity: Goal: Risk for activity intolerance will decrease Outcome: Not Progressing   Problem: Nutrition: Goal: Adequate nutrition will be maintained Outcome: Not Progressing   Problem: Coping: Goal: Level of anxiety will decrease Outcome: Not Progressing   Problem: Elimination: Goal: Will not experience complications related to bowel motility Outcome: Not Progressing Goal: Will not experience complications related to urinary retention Outcome: Not Progressing   Problem: Pain Managment: Goal: General experience of comfort will improve Outcome: Not Progressing   Problem: Safety: Goal: Ability to remain free from injury will improve Outcome: Not Progressing   Problem: Skin Integrity: Goal: Risk for impaired skin integrity will decrease Outcome: Not Progressing

## 2023-05-02 ENCOUNTER — Ambulatory Visit: Payer: 59 | Admitting: Physician Assistant

## 2023-05-02 ENCOUNTER — Inpatient Hospital Stay: Payer: 59

## 2023-05-02 DIAGNOSIS — L0231 Cutaneous abscess of buttock: Secondary | ICD-10-CM | POA: Diagnosis not present

## 2023-05-02 LAB — GLUCOSE, CAPILLARY
Glucose-Capillary: 101 mg/dL — ABNORMAL HIGH (ref 70–99)
Glucose-Capillary: 103 mg/dL — ABNORMAL HIGH (ref 70–99)
Glucose-Capillary: 126 mg/dL — ABNORMAL HIGH (ref 70–99)
Glucose-Capillary: 82 mg/dL (ref 70–99)

## 2023-05-02 MED ORDER — BENZONATATE 100 MG PO CAPS
200.0000 mg | ORAL_CAPSULE | Freq: Three times a day (TID) | ORAL | Status: DC
  Administered 2023-05-02 – 2023-05-05 (×9): 200 mg via ORAL
  Filled 2023-05-02 (×10): qty 2

## 2023-05-02 MED ORDER — HYDROCODONE BIT-HOMATROP MBR 5-1.5 MG/5ML PO SOLN: 5.0000 mL | Freq: Four times a day (QID) | ORAL | Status: AC | PRN

## 2023-05-02 MED ORDER — LEVALBUTEROL TARTRATE 45 MCG/ACT IN AERO
1.0000 | INHALATION_SPRAY | Freq: Three times a day (TID) | RESPIRATORY_TRACT | Status: DC
  Administered 2023-05-02: 1 via RESPIRATORY_TRACT
  Administered 2023-05-02 – 2023-05-03 (×3): 2 via RESPIRATORY_TRACT
  Administered 2023-05-03: 1 via RESPIRATORY_TRACT
  Administered 2023-05-04 – 2023-05-05 (×4): 2 via RESPIRATORY_TRACT
  Filled 2023-05-02: qty 1

## 2023-05-02 MED ORDER — GUAIFENESIN ER 600 MG PO TB12
600.0000 mg | ORAL_TABLET | Freq: Two times a day (BID) | ORAL | Status: DC
  Administered 2023-05-02 – 2023-05-03 (×3): 600 mg via ORAL
  Filled 2023-05-02 (×3): qty 1

## 2023-05-02 NOTE — Progress Notes (Signed)
PROGRESS NOTE    [name]  [name]:[id number] DOB: [birth year] DOA: 04/24/2023 PCP: [name], MD    Brief Narrative:  [name] is a 52 y.o. female  with medical history significant of multiple sclerosis, bedbound status, paraplegia, left upper extremity weakness, s/p urostomy, who presented to the hospital because of a wound on her left buttock that started about 2 weeks prior to admission.  She had been receiving local wound care but she noticed increasing drainage from the left buttock wound.  She developed a fever with temperature up to 101 F at home about 2 days prior to admission.  She also complained of congested cough.   She tested positive for COVID-19 infection and she was started on Paxlovid.  She was started on IV steroids in the emergency department because she was hypoxic (86%) on room air.  CT abdomen pelvis showed left gluteal abscess (2.5 x 1.5 cm) and possible early osteomyelitis of the left ischium.  She was treated with empiric IV antibiotics.  8/6: Remains on IV antibiotics.  Pending ID follow-up.  Assessment & Plan:   Principal Problem:   Left buttock abscess Active Problems:   COVID-19   Multiple sclerosis (HCC)   Obesity (BMI 30-39.9)   Neuropathic pain   Paraplegia (HCC)   Primary hypertension   Decubitus ulcer of sacral region, stage 3 (HCC)   History of ileal conduit   Gluteal abscess  Sepsis secondary to COVID-19 infection and left buttock abscess, possible early osteomyelitis of the left ischium:  S/p I&D of left gluteal abscess on 04/27/2023.   Gram stain from the left buttock wound (from 04/24/2023) showed few gram-positive cocci, and culture showed Proteus mirabilis and E. coli.  Wound culture from 04/27/2023 showed Pseudomonas aeruginosa. Plan: Continue IV cefepime and oral Flagyl. ID follow-up for antimicrobial management Outpatient follow-up with wound care center     Acute hypoxic respiratory failure COVID-19  infection Cough Patient still dependent on 1 to 2 L nasal cannula Plan: Repeat chest x-ray Xopenex inhaler Tessalon 200 mg p.o. 3 times daily As needed Hycodan   Hypokalemia:  Improved.       Multiple sclerosis, paraplegia, neuropathic pain:  Continue baclofen, gabapentin and Cymbalta.     History of ileal conduit, hypertension   DVT prophylaxis: SQ Lovenox Code Status: Full Family Communication: Family member on phone during my evaluation 8/6 Disposition Plan: Status is: Inpatient Remains inpatient appropriate because: Osteomyelitis on IV antibiotics.  Possible discharge in 24 hours.   Level of care: Med-Surg  Consultants:  ID  Procedures:  Sacral wound debridement  Antimicrobials: Cefepime Metronidazole   Subjective: Seen and examined.  Resting in bed.  No visible distress.  No complaints of pain.  Does complain of cough  Objective: Vitals:   05/01/23 1547 05/01/23 2025 05/02/23 0500 05/02/23 0829  BP: (!) 111/56 (!) 117/58 (!) 120/55 (!) 146/79  Pulse: 95 82 75 72  Resp: 20 20 18 18   Temp: 98.3 F (36.8 C) 98.3 F (36.8 C) 97.8 F (36.6 C) 97.7 F (36.5 C)  TempSrc:  Oral Oral   SpO2: 100% 100% 100% 100%  Weight:      Height:        Intake/Output Summary (Last 24 hours) at 05/02/2023 1024 Last data filed at 05/02/2023 0500 Gross per 24 hour  Intake --  Output 2450 ml  Net -2450 ml   Filed Weights   04/24/23 1841  Weight: 85.3 kg    Examination:  General exam:  Appears calm and comfortable  Respiratory system: Coarse breath sounds bilaterally.  Normal work of breathing.  1 L Cardiovascular system: S1-S2, RRR, no murmurs, no pedal edema Gastrointestinal system: Soft NT/ND, normal bowel sounds Central nervous system: Alert and oriented. No focal neurological deficits. Extremities: Decreased power bilateral lower extremities Skin: Sacral wound Psychiatry: Judgement and insight appear normal. Mood & affect appropriate.     Data Reviewed: I  have personally reviewed following labs and imaging studies  CBC: Recent Labs  Lab 04/26/23 0512 04/29/23 0946  WBC 9.4 12.3*  NEUTROABS  --  8.3*  HGB 11.5* 14.2  HCT 35.1* 42.2  MCV 89.3 87.7  PLT 336 430*   Basic Metabolic Panel: Recent Labs  Lab 04/27/23 1048 04/29/23 0946 05/01/23 0635 05/02/23 0447  NA  --  138  --  140  K  --  3.7  --  4.1  CL  --  103  --  107  CO2  --  26  --  25  GLUCOSE  --  86  --  100*  BUN  --  17  --  12  CREATININE 0.53 0.45 0.57 0.53  CALCIUM  --  9.0  --  8.8*  MG  --  2.2  --   --    GFR: Estimated Creatinine Clearance: 85.9 mL/min (by C-G formula based on SCr of 0.53 mg/dL). Liver Function Tests: No results for input(s): "AST", "ALT", "ALKPHOS", "BILITOT", "PROT", "ALBUMIN" in the last 168 hours. No results for input(s): "LIPASE", "AMYLASE" in the last 168 hours. No results for input(s): "AMMONIA" in the last 168 hours. Coagulation Profile: No results for input(s): "INR", "PROTIME" in the last 168 hours. Cardiac Enzymes: No results for input(s): "CKTOTAL", "CKMB", "CKMBINDEX", "TROPONINI" in the last 168 hours. BNP (last 3 results) No results for input(s): "PROBNP" in the last 8760 hours. HbA1C: No results for input(s): "HGBA1C" in the last 72 hours. CBG: Recent Labs  Lab 05/01/23 0840 05/01/23 1201 05/01/23 1549 05/01/23 2109 05/02/23 0758  GLUCAP 105* 115* 112* 115* 101*   Lipid Profile: No results for input(s): "CHOL", "HDL", "LDLCALC", "TRIG", "CHOLHDL", "LDLDIRECT" in the last 72 hours. Thyroid Function Tests: No results for input(s): "TSH", "T4TOTAL", "FREET4", "T3FREE", "THYROIDAB" in the last 72 hours. Anemia Panel: No results for input(s): "VITAMINB12", "FOLATE", "FERRITIN", "TIBC", "IRON", "RETICCTPCT" in the last 72 hours. Sepsis Labs: Recent Labs  Lab 04/26/23 0512  PROCALCITON 0.14    Recent Results (from the past 240 hour(s))  Resp panel by RT-PCR (RSV, Flu A&B, Covid) Abscess     Status: Abnormal    Collection Time: 04/24/23  9:09 PM   Specimen: Abscess; Nasal Swab  Result Value Ref Range Status   SARS Coronavirus 2 by RT PCR POSITIVE (A) NEGATIVE Final    Comment: (NOTE) SARS-CoV-2 target nucleic acids are DETECTED.  The SARS-CoV-2 RNA is generally detectable in upper respiratory specimens during the acute phase of infection. Positive results are indicative of the presence of the identified virus, but do not rule out bacterial infection or co-infection with other pathogens not detected by the test. Clinical correlation with patient history and other diagnostic information is necessary to determine patient infection status. The expected result is Negative.  Fact Sheet for Patients: BloggerCourse.com  Fact Sheet for Healthcare Providers: SeriousBroker.it  This test is not yet approved or cleared by the [place] FDA and  has been authorized for detection and/or diagnosis of SARS-CoV-2 by FDA under an Emergency Use Authorization (EUA).  This  EUA will remain in effect (meaning this test can be used) for the duration of  the COVID-19 declaration under Section 564(b)(1) of the A ct, 21 U.S.C. section 360bbb-3(b)(1), unless the authorization is terminated or revoked sooner.     Influenza A by PCR NEGATIVE NEGATIVE Final   Influenza B by PCR NEGATIVE NEGATIVE Final    Comment: (NOTE) The Xpert Xpress SARS-CoV-2/FLU/RSV plus assay is intended as an aid in the diagnosis of influenza from Nasopharyngeal swab specimens and should not be used as a sole basis for treatment. Nasal washings and aspirates are unacceptable for Xpert Xpress SARS-CoV-2/FLU/RSV testing.  Fact Sheet for Patients: BloggerCourse.com  Fact Sheet for Healthcare Providers: SeriousBroker.it  This test is not yet approved or cleared by the [place] FDA and has been authorized for detection and/or  diagnosis of SARS-CoV-2 by FDA under an Emergency Use Authorization (EUA). This EUA will remain in effect (meaning this test can be used) for the duration of the COVID-19 declaration under Section 564(b)(1) of the Act, 21 U.S.C. section 360bbb-3(b)(1), unless the authorization is terminated or revoked.     Resp Syncytial Virus by PCR NEGATIVE NEGATIVE Final    Comment: (NOTE) Fact Sheet for Patients: BloggerCourse.com  Fact Sheet for Healthcare Providers: SeriousBroker.it  This test is not yet approved or cleared by the [place] FDA and has been authorized for detection and/or diagnosis of SARS-CoV-2 by FDA under an Emergency Use Authorization (EUA). This EUA will remain in effect (meaning this test can be used) for the duration of the COVID-19 declaration under Section 564(b)(1) of the Act, 21 U.S.C. section 360bbb-3(b)(1), unless the authorization is terminated or revoked.  Performed at [facility], [address] [place], [place] [address]   Culture, blood (Routine x 2)     Status: None   Collection Time: 04/24/23  9:30 PM   Specimen: BLOOD  Result Value Ref Range Status   Specimen Description BLOOD BLOOD LEFT ARM  Final   Special Requests   Final    BOTTLES DRAWN AEROBIC AND ANAEROBIC Blood Culture results may not be optimal due to an inadequate volume of blood received in culture bottles   Culture   Final    NO GROWTH 5 DAYS Performed at [facility], [address], [place], [place] [address]    Report Status 04/29/2023 FINAL  Final  Aerobic/Anaerobic Culture w Gram Stain (surgical/deep wound)     Status: None   Collection Time: 04/24/23 11:35 PM   Specimen: Abscess  Result Value Ref Range Status   Specimen Description   Final    ABSCESS Performed at [facility], [address], [place], [place] [address]    Special Requests   Final    NONE Performed at [facility],  [address], [place], [place] [address]    Gram Stain   Final    RARE WBC PRESENT, PREDOMINANTLY PMN FEW GRAM POSITIVE COCCI    Culture   Final    MODERATE PROTEUS MIRABILIS MODERATE ESCHERICHIA COLI NO ANAEROBES ISOLATED Performed at [facility], [address], [place], [place] [address]    Report Status 05/01/2023 FINAL  Final   Organism ID, Bacteria PROTEUS MIRABILIS  Final   Organism ID, Bacteria ESCHERICHIA COLI  Final      Susceptibility   Escherichia coli - MIC*    AMPICILLIN >=32 RESISTANT Resistant     CEFEPIME <=0.12 SENSITIVE Sensitive     CEFTAZIDIME <=1 SENSITIVE Sensitive  CEFTRIAXONE <=0.25 SENSITIVE Sensitive     CIPROFLOXACIN <=0.25 SENSITIVE Sensitive     GENTAMICIN <=1 SENSITIVE Sensitive     IMIPENEM <=0.25 SENSITIVE Sensitive     TRIMETH/SULFA <=20 SENSITIVE Sensitive     AMPICILLIN/SULBACTAM >=32 RESISTANT Resistant     PIP/TAZO <=4 SENSITIVE Sensitive     * MODERATE ESCHERICHIA COLI   Proteus mirabilis - MIC*    AMPICILLIN <=2 SENSITIVE Sensitive     CEFEPIME <=0.12 SENSITIVE Sensitive     CEFTAZIDIME <=1 SENSITIVE Sensitive     CEFTRIAXONE <=0.25 SENSITIVE Sensitive     CIPROFLOXACIN <=0.25 SENSITIVE Sensitive     GENTAMICIN <=1 SENSITIVE Sensitive     IMIPENEM 2 SENSITIVE Sensitive     TRIMETH/SULFA <=20 SENSITIVE Sensitive     AMPICILLIN/SULBACTAM <=2 SENSITIVE Sensitive     PIP/TAZO <=4 SENSITIVE Sensitive     * MODERATE PROTEUS MIRABILIS  Culture, blood (Routine x 2)     Status: None   Collection Time: 04/25/23  2:44 AM   Specimen: BLOOD  Result Value Ref Range Status   Specimen Description BLOOD LEFT HAND  Final   Special Requests   Final    BOTTLES DRAWN AEROBIC AND ANAEROBIC Blood Culture adequate volume   Culture   Final    NO GROWTH 5 DAYS Performed at [facility], [address] [place], [place] [address]    Report Status 04/30/2023 FINAL  Final  Aerobic/Anaerobic Culture w Gram Stain (surgical/deep  wound)     Status: None (Preliminary result)   Collection Time: 04/27/23  6:27 PM   Specimen: Buttocks; Abscess  Result Value Ref Range Status   Specimen Description   Final    BUTTOCKS Performed at [facility], [address], [place], [place] [address]    Special Requests   Final    NONE Performed at [facility], [address] [place], [place] [address]    Gram Stain   Final    NO WBC SEEN NO ORGANISMS SEEN Performed at [facility], [address] [place], [place] [address]    Culture   Final    RARE PSEUDOMONAS AERUGINOSA RARE PROTEUS MIRABILIS NO ANAEROBES ISOLATED; CULTURE IN PROGRESS FOR 5 DAYS    Report Status PENDING  Incomplete   Organism ID, Bacteria PSEUDOMONAS AERUGINOSA  Final   Organism ID, Bacteria PROTEUS MIRABILIS  Final      Susceptibility   Pseudomonas aeruginosa - MIC*    CEFTAZIDIME 4 SENSITIVE Sensitive     CIPROFLOXACIN <=0.25 SENSITIVE Sensitive     GENTAMICIN 2 SENSITIVE Sensitive     IMIPENEM 2 SENSITIVE Sensitive     PIP/TAZO 16 SENSITIVE Sensitive     CEFEPIME 2 SENSITIVE Sensitive     * RARE PSEUDOMONAS AERUGINOSA   Proteus mirabilis - MIC*    AMPICILLIN <=2 SENSITIVE Sensitive     CEFEPIME <=0.12 SENSITIVE Sensitive     CEFTAZIDIME <=1 SENSITIVE Sensitive     CEFTRIAXONE <=0.25 SENSITIVE Sensitive     CIPROFLOXACIN <=0.25 SENSITIVE Sensitive     GENTAMICIN <=1 SENSITIVE Sensitive     IMIPENEM 2 SENSITIVE Sensitive     TRIMETH/SULFA <=20 SENSITIVE Sensitive     AMPICILLIN/SULBACTAM <=2 SENSITIVE Sensitive     PIP/TAZO <=4 SENSITIVE Sensitive     * RARE PROTEUS MIRABILIS         Radiology Studies: No results found.      Scheduled Meds:  amantadine  200 mg Oral BID   baclofen  20 mg Oral QID   benzonatate  200 mg Oral TID   cholecalciferol  1,000 Units Oral Daily   DULoxetine  60 mg Oral Daily   enoxaparin (LOVENOX) injection  0.5 mg/kg Subcutaneous Q24H   gabapentin  200 mg Oral QID    guaiFENesin  600 mg Oral BID   insulin aspart  0-15 Units Subcutaneous TID WC   insulin aspart  0-5 Units Subcutaneous QHS   levalbuterol  1-2 puff Inhalation Q8H   metroNIDAZOLE  500 mg Oral Q12H   pantoprazole  40 mg Oral BID   potassium chloride  40 mEq Oral Daily   senna-docusate  2 tablet Oral BID   sodium chloride flush  3 mL Intravenous Q12H   Continuous Infusions:  ceFEPime (MAXIPIME) IV 2 g (05/02/23 0815)     LOS: 8 days     Tresa Norem, MD [facility]   If 7PM-7AM, please contact night-coverage  05/02/2023, 10:24 AM

## 2023-05-02 NOTE — Plan of Care (Signed)
Pt is alert and oriented, VSS, denies any c/o pain. Pt has MS with bilateral paralysis to legs and admitted for abscess to buttocks with dressing changes BID. Pt on isolation for positive COVID-airborne. Pt receiving IV antibiotics.  Problem: Education: Goal: Ability to describe self-care measures that may prevent or decrease complications (Diabetes Survival Skills Education) will improve Outcome: Not Progressing Goal: Individualized Educational Video(s) Outcome: Not Progressing   Problem: Coping: Goal: Ability to adjust to condition or change in health will improve Outcome: Not Progressing   Problem: Fluid Volume: Goal: Ability to maintain a balanced intake and output will improve Outcome: Not Progressing   Problem: Health Behavior/Discharge Planning: Goal: Ability to identify and utilize available resources and services will improve Outcome: Not Progressing Goal: Ability to manage health-related needs will improve Outcome: Not Progressing   Problem: Metabolic: Goal: Ability to maintain appropriate glucose levels will improve Outcome: Not Progressing   Problem: Nutritional: Goal: Maintenance of adequate nutrition will improve Outcome: Not Progressing Goal: Progress toward achieving an optimal weight will improve Outcome: Not Progressing   Problem: Skin Integrity: Goal: Risk for impaired skin integrity will decrease Outcome: Not Progressing   Problem: Tissue Perfusion: Goal: Adequacy of tissue perfusion will improve Outcome: Not Progressing   Problem: Education: Goal: Knowledge of General Education information will improve Description: Including pain rating scale, medication(s)/side effects and non-pharmacologic comfort measures Outcome: Not Progressing   Problem: Health Behavior/Discharge Planning: Goal: Ability to manage health-related needs will improve Outcome: Not Progressing   Problem: Clinical Measurements: Goal: Ability to maintain clinical measurements within  normal limits will improve Outcome: Not Progressing Goal: Will remain free from infection Outcome: Not Progressing Goal: Diagnostic test results will improve Outcome: Not Progressing Goal: Respiratory complications will improve Outcome: Not Progressing Goal: Cardiovascular complication will be avoided Outcome: Not Progressing   Problem: Activity: Goal: Risk for activity intolerance will decrease Outcome: Not Progressing   Problem: Nutrition: Goal: Adequate nutrition will be maintained Outcome: Not Progressing   Problem: Coping: Goal: Level of anxiety will decrease Outcome: Not Progressing   Problem: Elimination: Goal: Will not experience complications related to bowel motility Outcome: Not Progressing Goal: Will not experience complications related to urinary retention Outcome: Not Progressing   Problem: Pain Managment: Goal: General experience of comfort will improve Outcome: Not Progressing   Problem: Safety: Goal: Ability to remain free from injury will improve Outcome: Not Progressing   Problem: Skin Integrity: Goal: Risk for impaired skin integrity will decrease Outcome: Not Progressing

## 2023-05-02 NOTE — Plan of Care (Signed)
  Problem: Education: Goal: Ability to describe self-care measures that may prevent or decrease complications (Diabetes Survival Skills Education) will improve Outcome: Progressing   Problem: Coping: Goal: Ability to adjust to condition or change in health will improve Outcome: Progressing   Problem: Metabolic: Goal: Ability to maintain appropriate glucose levels will improve Outcome: Progressing   Problem: Nutritional: Goal: Progress toward achieving an optimal weight will improve Outcome: Progressing

## 2023-05-03 ENCOUNTER — Inpatient Hospital Stay: Payer: 59

## 2023-05-03 DIAGNOSIS — G35 Multiple sclerosis: Secondary | ICD-10-CM | POA: Diagnosis not present

## 2023-05-03 DIAGNOSIS — L0231 Cutaneous abscess of buttock: Secondary | ICD-10-CM | POA: Diagnosis not present

## 2023-05-03 DIAGNOSIS — L89153 Pressure ulcer of sacral region, stage 3: Secondary | ICD-10-CM | POA: Diagnosis not present

## 2023-05-03 DIAGNOSIS — G8220 Paraplegia, unspecified: Secondary | ICD-10-CM | POA: Diagnosis not present

## 2023-05-03 LAB — GLUCOSE, CAPILLARY
Glucose-Capillary: 100 mg/dL — ABNORMAL HIGH (ref 70–99)
Glucose-Capillary: 140 mg/dL — ABNORMAL HIGH (ref 70–99)
Glucose-Capillary: 90 mg/dL (ref 70–99)
Glucose-Capillary: 118 mg/dL — ABNORMAL HIGH (ref 70–99)

## 2023-05-03 MED ORDER — GUAIFENESIN ER 600 MG PO TB12
1200.0000 mg | ORAL_TABLET | Freq: Two times a day (BID) | ORAL | Status: DC
  Administered 2023-05-03 – 2023-05-05 (×4): 1200 mg via ORAL
  Filled 2023-05-03 (×5): qty 2

## 2023-05-03 NOTE — Plan of Care (Signed)

## 2023-05-03 NOTE — Progress Notes (Signed)
Date of Admission:  04/24/2023     ID: [name] is a 52 y.o. female  Principal Problem:   Left buttock abscess Active Problems:   Neuropathic pain   Multiple sclerosis (HCC)   Obesity (BMI 30-39.9)   Paraplegia (HCC)   Primary hypertension   Decubitus ulcer of sacral region, stage 3 (HCC)   COVID-19   History of ileal conduit   Gluteal abscess    Subjective: She has some wheezing Medications:   amantadine  200 mg Oral BID   baclofen  20 mg Oral QID   benzonatate  200 mg Oral TID   cholecalciferol  1,000 Units Oral Daily   DULoxetine  60 mg Oral Daily   enoxaparin (LOVENOX) injection  0.5 mg/kg Subcutaneous Q24H   gabapentin  200 mg Oral QID   guaiFENesin  600 mg Oral BID   insulin aspart  0-15 Units Subcutaneous TID WC   insulin aspart  0-5 Units Subcutaneous QHS   levalbuterol  1-2 puff Inhalation Q8H   metroNIDAZOLE  500 mg Oral Q12H   pantoprazole  40 mg Oral BID   potassium chloride  40 mEq Oral Daily   senna-docusate  2 tablet Oral BID   sodium chloride flush  3 mL Intravenous Q12H    Objective: Vital signs in last 24 hours: Patient Vitals for the past 24 hrs:  BP Temp Temp src Pulse Resp SpO2  05/03/23 0841 106/76 97.9 F (36.6 C) -- 92 18 100 %  05/03/23 0604 129/67 97.9 F (36.6 C) Oral 78 -- (!) 88 %  05/02/23 2111 129/70 99 F (37.2 C) Oral 94 -- 97 %  05/02/23 1556 (!) 115/54 -- -- (!) 108 19 96 %      PHYSICAL EXAM:  General: Alert, cooperative,   Lungs: Bilateral air entry.  Few rhonchi  Heart: s1s2 Abdomen: .  Ileal conduit  extremities: Edema legs Gluteal area- I/D abscess /wound is very clean with granulation tissue packed  Skin: No rashes or lesions. Or bruising Lymph: Cervical, supraclavicular normal. Neurologic: Paraplegia    Lab Results    Latest Ref Rng & Units 04/29/2023    9:46 AM 04/26/2023    5:12 AM 04/25/2023    3:17 AM  CBC  WBC 4.0 - 10.5 K/uL 12.3  9.4  11.3   Hemoglobin 12.0 - 15.0 g/dL 78.4  69.6  29.5    Hematocrit 36.0 - 46.0 % 42.2  35.1  38.0   Platelets 150 - 400 K/uL 430  336  366        Latest Ref Rng & Units 05/02/2023    4:47 AM 05/01/2023    6:35 AM 04/29/2023    9:46 AM  CMP  Glucose 70 - 99 mg/dL 284   86   BUN 6 - 20 mg/dL 12   17   Creatinine 1.32 - 1.00 mg/dL 4.40  1.02  7.25   Sodium 135 - 145 mmol/L 140   138   Potassium 3.5 - 5.1 mmol/L 4.1   3.7   Chloride 98 - 111 mmol/L 107   103   CO2 22 - 32 mmol/L 25   26   Calcium 8.9 - 10.3 mg/dL 8.8   9.0       Microbiology: Blood culture no growth Abscess culture  Proteus, pseudomonas, ecoli Studies/Results: DG Chest Port 1 View  Result Date: 05/02/2023 CLINICAL DATA:  53 year old female with cough. EXAM: PORTABLE CHEST 1 VIEW COMPARISON:  Portable chest 04/26/2023 and  earlier. FINDINGS: Portable AP upright view at 1023 hours. Increased veiling opacity at the right lung base is more suspicious for increasing pleural effusion rather than lower lung consolidation, and no air bronchograms are identified. Upper lungs remain clear. No pneumothorax or pulmonary edema. Mediastinal contours are within normal limits. Visible bowel gas pattern is stable including splenic flexure retained stool. No acute osseous abnormality identified. IMPRESSION: 1. Worsening ventilation at the right lung base, nonspecific but more suggestive of increasing pleural effusion than consolidation. Recommend confirmation with ultrasound prior to any attempted thoracentesis. 2. No other acute cardiopulmonary abnormality. Electronically Signed   By: Odessa Fleming M.D.   On: 05/02/2023 12:49     Assessment/Plan: 52 y.o.female  with a history of MS, paraplegia, neurogenic bladder s/p ileal conduit , left medial gluteal  decubitus presented from home with fever and increased drainage from the sacral wound-   Fever-like 3 secondary to COVID has a dissolved On paxlovid   Left medial gluteal pressure ulcer-with surrounding abscess surgery saw her and did I/D - it looks  clean with granulation tissue- no sign of infection now  E. coli and Proteus and pseudomonas in culture.  On cefepime + flagyl  Can switch to PO ciprofloxacin and PO flagyl until 05/09/23  Multiple sclerosis   Paraplegia   Neurogenic bladder- ileal conduit   HTN   Discussed the management with patient and Hospitalist ID will sign off- call if needed

## 2023-05-03 NOTE — Care Management Important Message (Signed)
Important Message  Patient Details  Name: [name] MRN: [id number] Date of Birth: 08-12-1971   Medicare Important Message Given:  Yes  Patient is in an isolation room so I reviewed the Important Message from Medicare by phone [phone]) and she stated she understood her rights. I wished her a speedy recovery and thanked her for her time.   Olegario Messier A  05/03/2023, 11:37 AM

## 2023-05-03 NOTE — Progress Notes (Signed)
PROGRESS NOTE    [name]  ZOX:[id number] DOB: 27-May-1971 DOA: 04/24/2023 PCP: [name], MD    Brief Narrative:  [name] is a 52 y.o. female  with medical history significant of multiple sclerosis, bedbound status, paraplegia, left upper extremity weakness, s/p urostomy, who presented to the hospital because of a wound on her left buttock that started about 2 weeks prior to admission.  She had been receiving local wound care but she noticed increasing drainage from the left buttock wound.  She developed a fever with temperature up to 101 F at home about 2 days prior to admission.  She also complained of congested cough.   She tested positive for COVID-19 infection and she was started on Paxlovid.  She was started on IV steroids in the emergency department because she was hypoxic (86%) on room air.  CT abdomen pelvis showed left gluteal abscess (2.5 x 1.5 cm) and possible early osteomyelitis of the left ischium.  She was treated with empiric IV antibiotics.  8/6: Remains on IV antibiotics.  Pending ID follow-up. 8/7: Wound looks reassuring.  Appropriate healing.  De-escalated to oral antibiotics.  Case discussed with ID.  Patient still dependent on supplemental oxygen.  Only on 1 L however becomes short of breath without it.  Chest x-ray demonstrates possible right-sided effusion.  Ultrasound ordered for better characterization  Assessment & Plan:   Principal Problem:   Left buttock abscess Active Problems:   COVID-19   Multiple sclerosis (HCC)   Obesity (BMI 30-39.9)   Neuropathic pain   Paraplegia (HCC)   Primary hypertension   Decubitus ulcer of sacral region, stage 3 (HCC)   History of ileal conduit   Gluteal abscess  Sepsis secondary to COVID-19 infection and left buttock abscess, possible early osteomyelitis of the left ischium:  S/p I&D of left gluteal abscess on 04/27/2023.   Gram stain from the left buttock wound (from 04/24/2023) showed few  gram-positive cocci, and culture showed Proteus mirabilis and E. coli.  Wound culture from 04/27/2023 showed Pseudomonas aeruginosa. Plan: De-escalate to oral ciprofloxacin and metronidazole for additional 6 days.  No need for PICC line.  No indication for IV antibiotics to continue.     Acute hypoxic respiratory failure COVID-19 infection Cough Possible right pleural effusion Patient still dependent on 1 to 2 L nasal cannula Plan: Patient still short of breath.  Coarse breath sounds.  Sounds more airway.  Chest x-ray with possible pleural effusion.  Chest ultrasound ordered for better characterization.  Continue Tessalon 3 times daily.  As needed Hycodan.  Xopenex inhaler.  Stressed I-S and flutter use.  Possible thoracentesis pending ultrasound results.  Hypokalemia:  Improved.       Multiple sclerosis, paraplegia, neuropathic pain:  Continue baclofen, gabapentin and Cymbalta.     History of ileal conduit, hypertension   DVT prophylaxis: SQ Lovenox Code Status: Full Family Communication: Family member on phone during my evaluation 8/6 Disposition Plan: Status is: Inpatient Remains inpatient appropriate because: Recovering osteomyelitis.  Patient with shortness of breath and possible right pleural effusion.  Further workup in progress.  Possible discharge in 24 hours.   Level of care: Med-Surg  Consultants:  ID  Procedures:  Sacral wound debridement  Antimicrobials: Metronidazole Ciprofloxacin   Subjective: Patient seen and examined.  Resting in bed.  Reports feeling better.  Still with cough and congestion.  Objective: Vitals:   05/03/23 0604 05/03/23 0841 05/03/23 0900 05/03/23 1230  BP: 129/67 106/76    Pulse:  78 92    Resp:  18    Temp: 97.9 F (36.6 C) 97.9 F (36.6 C)    TempSrc: Oral     SpO2: (!) 88% 100% 99% (!) 88%  Weight:      Height:        Intake/Output Summary (Last 24 hours) at 05/03/2023 1522 Last data filed at 05/03/2023 1413 Gross per 24  hour  Intake 500 ml  Output 2450 ml  Net -1950 ml   Filed Weights   04/24/23 1841  Weight: 85.3 kg    Examination:  General exam: NAD Respiratory system: Coarse breath sounds, worse on the right.  Normal work of breathing.  1 L Cardiovascular system: S1-S2, RRR, no murmurs, no pedal edema Gastrointestinal system: Soft NT/ND, normal bowel sounds Central nervous system: Alert and oriented. No focal neurological deficits. Extremities: Decreased power bilateral lower extremities Skin: Sacral wound Psychiatry: Judgement and insight appear normal. Mood & affect appropriate.     Data Reviewed: I have personally reviewed following labs and imaging studies  CBC: Recent Labs  Lab 04/29/23 0946  WBC 12.3*  NEUTROABS 8.3*  HGB 14.2  HCT 42.2  MCV 87.7  PLT 430*   Basic Metabolic Panel: Recent Labs  Lab 04/27/23 1048 04/29/23 0946 05/01/23 0635 05/02/23 0447  NA  --  138  --  140  K  --  3.7  --  4.1  CL  --  103  --  107  CO2  --  26  --  25  GLUCOSE  --  86  --  100*  BUN  --  17  --  12  CREATININE 0.53 0.45 0.57 0.53  CALCIUM  --  9.0  --  8.8*  MG  --  2.2  --   --    GFR: Estimated Creatinine Clearance: 85.9 mL/min (by C-G formula based on SCr of 0.53 mg/dL). Liver Function Tests: No results for input(s): "AST", "ALT", "ALKPHOS", "BILITOT", "PROT", "ALBUMIN" in the last 168 hours. No results for input(s): "LIPASE", "AMYLASE" in the last 168 hours. No results for input(s): "AMMONIA" in the last 168 hours. Coagulation Profile: No results for input(s): "INR", "PROTIME" in the last 168 hours. Cardiac Enzymes: No results for input(s): "CKTOTAL", "CKMB", "CKMBINDEX", "TROPONINI" in the last 168 hours. BNP (last 3 results) No results for input(s): "PROBNP" in the last 8760 hours. HbA1C: No results for input(s): "HGBA1C" in the last 72 hours. CBG: Recent Labs  Lab 05/02/23 1128 05/02/23 1549 05/02/23 2111 05/03/23 0805 05/03/23 1123  GLUCAP 103* 126* 82  100* 140*   Lipid Profile: No results for input(s): "CHOL", "HDL", "LDLCALC", "TRIG", "CHOLHDL", "LDLDIRECT" in the last 72 hours. Thyroid Function Tests: No results for input(s): "TSH", "T4TOTAL", "FREET4", "T3FREE", "THYROIDAB" in the last 72 hours. Anemia Panel: No results for input(s): "VITAMINB12", "FOLATE", "FERRITIN", "TIBC", "IRON", "RETICCTPCT" in the last 72 hours. Sepsis Labs: No results for input(s): "PROCALCITON", "LATICACIDVEN" in the last 168 hours.   Recent Results (from the past 240 hour(s))  Resp panel by RT-PCR (RSV, Flu A&B, Covid) Abscess     Status: Abnormal   Collection Time: 04/24/23  9:09 PM   Specimen: Abscess; Nasal Swab  Result Value Ref Range Status   SARS Coronavirus 2 by RT PCR POSITIVE (A) NEGATIVE Final    Comment: (NOTE) SARS-CoV-2 target nucleic acids are DETECTED.  The SARS-CoV-2 RNA is generally detectable in upper respiratory specimens during the acute phase of infection. Positive results are indicative of the presence  of the identified virus, but do not rule out bacterial infection or co-infection with other pathogens not detected by the test. Clinical correlation with patient history and other diagnostic information is necessary to determine patient infection status. The expected result is Negative.  Fact Sheet for Patients: BloggerCourse.com  Fact Sheet for Healthcare Providers: SeriousBroker.it  This test is not yet approved or cleared by the [place] FDA and  has been authorized for detection and/or diagnosis of SARS-CoV-2 by FDA under an Emergency Use Authorization (EUA).  This EUA will remain in effect (meaning this test can be used) for the duration of  the COVID-19 declaration under Section 564(b)(1) of the A ct, 21 U.S.C. section 360bbb-3(b)(1), unless the authorization is terminated or revoked sooner.     Influenza A by PCR NEGATIVE NEGATIVE Final   Influenza B by PCR  NEGATIVE NEGATIVE Final    Comment: (NOTE) The Xpert Xpress SARS-CoV-2/FLU/RSV plus assay is intended as an aid in the diagnosis of influenza from Nasopharyngeal swab specimens and should not be used as a sole basis for treatment. Nasal washings and aspirates are unacceptable for Xpert Xpress SARS-CoV-2/FLU/RSV testing.  Fact Sheet for Patients: BloggerCourse.com  Fact Sheet for Healthcare Providers: SeriousBroker.it  This test is not yet approved or cleared by the [place] FDA and has been authorized for detection and/or diagnosis of SARS-CoV-2 by FDA under an Emergency Use Authorization (EUA). This EUA will remain in effect (meaning this test can be used) for the duration of the COVID-19 declaration under Section 564(b)(1) of the Act, 21 U.S.C. section 360bbb-3(b)(1), unless the authorization is terminated or revoked.     Resp Syncytial Virus by PCR NEGATIVE NEGATIVE Final    Comment: (NOTE) Fact Sheet for Patients: BloggerCourse.com  Fact Sheet for Healthcare Providers: SeriousBroker.it  This test is not yet approved or cleared by the [place] FDA and has been authorized for detection and/or diagnosis of SARS-CoV-2 by FDA under an Emergency Use Authorization (EUA). This EUA will remain in effect (meaning this test can be used) for the duration of the COVID-19 declaration under Section 564(b)(1) of the Act, 21 U.S.C. section 360bbb-3(b)(1), unless the authorization is terminated or revoked.  Performed at [facility], [address] [place], [place] [address]   Culture, blood (Routine x 2)     Status: None   Collection Time: 04/24/23  9:30 PM   Specimen: BLOOD  Result Value Ref Range Status   Specimen Description BLOOD BLOOD LEFT ARM  Final   Special Requests   Final    BOTTLES DRAWN AEROBIC AND ANAEROBIC Blood Culture results may not be optimal due  to an inadequate volume of blood received in culture bottles   Culture   Final    NO GROWTH 5 DAYS Performed at [facility], [address], [place], [place] [address]    Report Status 04/29/2023 FINAL  Final  Aerobic/Anaerobic Culture w Gram Stain (surgical/deep wound)     Status: None   Collection Time: 04/24/23 11:35 PM   Specimen: Abscess  Result Value Ref Range Status   Specimen Description   Final    ABSCESS Performed at [facility], [address], [place], [place] [address]    Special Requests   Final    NONE Performed at [facility], [address] [place], [place] [address]    Gram Stain   Final    RARE WBC PRESENT, PREDOMINANTLY PMN FEW GRAM POSITIVE COCCI    Culture  Final    MODERATE PROTEUS MIRABILIS MODERATE ESCHERICHIA COLI NO ANAEROBES ISOLATED Performed at [facility], [address] [place], [place] [address]    Report Status 05/01/2023 FINAL  Final   Organism ID, Bacteria PROTEUS MIRABILIS  Final   Organism ID, Bacteria ESCHERICHIA COLI  Final      Susceptibility   Escherichia coli - MIC*    AMPICILLIN >=32 RESISTANT Resistant     CEFEPIME <=0.12 SENSITIVE Sensitive     CEFTAZIDIME <=1 SENSITIVE Sensitive     CEFTRIAXONE <=0.25 SENSITIVE Sensitive     CIPROFLOXACIN <=0.25 SENSITIVE Sensitive     GENTAMICIN <=1 SENSITIVE Sensitive     IMIPENEM <=0.25 SENSITIVE Sensitive     TRIMETH/SULFA <=20 SENSITIVE Sensitive     AMPICILLIN/SULBACTAM >=32 RESISTANT Resistant     PIP/TAZO <=4 SENSITIVE Sensitive     * MODERATE ESCHERICHIA COLI   Proteus mirabilis - MIC*    AMPICILLIN <=2 SENSITIVE Sensitive     CEFEPIME <=0.12 SENSITIVE Sensitive     CEFTAZIDIME <=1 SENSITIVE Sensitive     CEFTRIAXONE <=0.25 SENSITIVE Sensitive     CIPROFLOXACIN <=0.25 SENSITIVE Sensitive     GENTAMICIN <=1 SENSITIVE Sensitive     IMIPENEM 2 SENSITIVE Sensitive     TRIMETH/SULFA <=20 SENSITIVE Sensitive     AMPICILLIN/SULBACTAM <=2  SENSITIVE Sensitive     PIP/TAZO <=4 SENSITIVE Sensitive     * MODERATE PROTEUS MIRABILIS  Culture, blood (Routine x 2)     Status: None   Collection Time: 04/25/23  2:44 AM   Specimen: BLOOD  Result Value Ref Range Status   Specimen Description BLOOD LEFT HAND  Final   Special Requests   Final    BOTTLES DRAWN AEROBIC AND ANAEROBIC Blood Culture adequate volume   Culture   Final    NO GROWTH 5 DAYS Performed at [facility], [address], [place], [place] [address]    Report Status 04/30/2023 FINAL  Final  Aerobic/Anaerobic Culture w Gram Stain (surgical/deep wound)     Status: None   Collection Time: 04/27/23  6:27 PM   Specimen: Buttocks; Abscess  Result Value Ref Range Status   Specimen Description   Final    BUTTOCKS Performed at [facility], [address], [place], [place] [address]    Special Requests   Final    NONE Performed at [facility], [address] [place], [place] [address]    Gram Stain NO WBC SEEN NO ORGANISMS SEEN   Final   Culture   Final    RARE PSEUDOMONAS AERUGINOSA RARE PROTEUS MIRABILIS NO ANAEROBES ISOLATED Performed at [facility], [address], [place], [place] [address]    Report Status 05/02/2023 FINAL  Final   Organism ID, Bacteria PSEUDOMONAS AERUGINOSA  Final   Organism ID, Bacteria PROTEUS MIRABILIS  Final      Susceptibility   Pseudomonas aeruginosa - MIC*    CEFTAZIDIME 4 SENSITIVE Sensitive     CIPROFLOXACIN <=0.25 SENSITIVE Sensitive     GENTAMICIN 2 SENSITIVE Sensitive     IMIPENEM 2 SENSITIVE Sensitive     PIP/TAZO 16 SENSITIVE Sensitive     CEFEPIME 2 SENSITIVE Sensitive     * RARE PSEUDOMONAS AERUGINOSA   Proteus mirabilis - MIC*    AMPICILLIN <=2 SENSITIVE Sensitive     CEFEPIME <=0.12 SENSITIVE Sensitive     CEFTAZIDIME <=1 SENSITIVE Sensitive     CEFTRIAXONE <=0.25 SENSITIVE Sensitive     CIPROFLOXACIN <=0.25  SENSITIVE Sensitive     GENTAMICIN <=1 SENSITIVE Sensitive      IMIPENEM 2 SENSITIVE Sensitive     TRIMETH/SULFA <=20 SENSITIVE Sensitive     AMPICILLIN/SULBACTAM <=2 SENSITIVE Sensitive     PIP/TAZO <=4 SENSITIVE Sensitive     * RARE PROTEUS MIRABILIS         Radiology Studies: DG Chest Port 1 View  Result Date: 05/02/2023 CLINICAL DATA:  52 year old female with cough. EXAM: PORTABLE CHEST 1 VIEW COMPARISON:  Portable chest 04/26/2023 and earlier. FINDINGS: Portable AP upright view at 1023 hours. Increased veiling opacity at the right lung base is more suspicious for increasing pleural effusion rather than lower lung consolidation, and no air bronchograms are identified. Upper lungs remain clear. No pneumothorax or pulmonary edema. Mediastinal contours are within normal limits. Visible bowel gas pattern is stable including splenic flexure retained stool. No acute osseous abnormality identified. IMPRESSION: 1. Worsening ventilation at the right lung base, nonspecific but more suggestive of increasing pleural effusion than consolidation. Recommend confirmation with ultrasound prior to any attempted thoracentesis. 2. No other acute cardiopulmonary abnormality. Electronically Signed   By: Odessa Fleming M.D.   On: 05/02/2023 12:49        Scheduled Meds:  amantadine  200 mg Oral BID   baclofen  20 mg Oral QID   benzonatate  200 mg Oral TID   cholecalciferol  1,000 Units Oral Daily   DULoxetine  60 mg Oral Daily   enoxaparin (LOVENOX) injection  0.5 mg/kg Subcutaneous Q24H   gabapentin  200 mg Oral QID   guaiFENesin  1,200 mg Oral BID   insulin aspart  0-15 Units Subcutaneous TID WC   insulin aspart  0-5 Units Subcutaneous QHS   levalbuterol  1-2 puff Inhalation Q8H   metroNIDAZOLE  500 mg Oral Q12H   pantoprazole  40 mg Oral BID   potassium chloride  40 mEq Oral Daily   senna-docusate  2 tablet Oral BID   sodium chloride flush  3 mL Intravenous Q12H   Continuous Infusions:  ceFEPime (MAXIPIME) IV 2 g (05/03/23 1413)     LOS: 9 days     Tresa Costilow, MD [facility]   If 7PM-7AM, please contact night-coverage  05/03/2023, 3:22 PM

## 2023-05-03 NOTE — Plan of Care (Signed)
  Problem: Education: Goal: Ability to describe self-care measures that may prevent or decrease complications (Diabetes Survival Skills Education) will improve Outcome: Progressing   Problem: Coping: Goal: Ability to adjust to condition or change in health will improve Outcome: Progressing   Problem: Metabolic: Goal: Ability to maintain appropriate glucose levels will improve Outcome: Progressing   Problem: Nutritional: Goal: Progress toward achieving an optimal weight will improve Outcome: Progressing

## 2023-05-04 DIAGNOSIS — L0231 Cutaneous abscess of buttock: Secondary | ICD-10-CM | POA: Diagnosis not present

## 2023-05-04 LAB — GLUCOSE, CAPILLARY
Glucose-Capillary: 101 mg/dL — ABNORMAL HIGH (ref 70–99)
Glucose-Capillary: 128 mg/dL — ABNORMAL HIGH (ref 70–99)
Glucose-Capillary: 129 mg/dL — ABNORMAL HIGH (ref 70–99)
Glucose-Capillary: 114 mg/dL — ABNORMAL HIGH (ref 70–99)

## 2023-05-04 MED ORDER — FUROSEMIDE 10 MG/ML IJ SOLN
40.0000 mg | Freq: Once | INTRAMUSCULAR | Status: AC
  Administered 2023-05-04: 40 mg via INTRAVENOUS
  Filled 2023-05-04: qty 4

## 2023-05-04 MED ORDER — CIPROFLOXACIN HCL 500 MG PO TABS
500.0000 mg | ORAL_TABLET | Freq: Two times a day (BID) | ORAL | Status: DC
  Administered 2023-05-05: 500 mg via ORAL
  Filled 2023-05-04: qty 1

## 2023-05-04 NOTE — Plan of Care (Signed)
  Problem: Coping: Goal: Ability to adjust to condition or change in health will improve Outcome: Progressing   Problem: Metabolic: Goal: Ability to maintain appropriate glucose levels will improve Outcome: Progressing   Problem: Nutritional: Goal: Maintenance of adequate nutrition will improve Outcome: Progressing Goal: Progress toward achieving an optimal weight will improve Outcome: Progressing   Problem: Education: Goal: Knowledge of General Education information will improve Description: Including pain rating scale, medication(s)/side effects and non-pharmacologic comfort measures Outcome: Progressing   Problem: Clinical Measurements: Goal: Ability to maintain clinical measurements within normal limits will improve Outcome: Progressing Goal: Diagnostic test results will improve Outcome: Progressing Goal: Respiratory complications will improve Outcome: Progressing Goal: Cardiovascular complication will be avoided Outcome: Progressing   Problem: Coping: Goal: Level of anxiety will decrease Outcome: Progressing   Problem: Elimination: Goal: Will not experience complications related to bowel motility Outcome: Progressing Goal: Will not experience complications related to urinary retention Outcome: Progressing   Problem: Pain Managment: Goal: General experience of comfort will improve Outcome: Progressing   Problem: Safety: Goal: Ability to remain free from injury will improve Outcome: Progressing

## 2023-05-04 NOTE — Progress Notes (Signed)
PROGRESS NOTE    [name]  WGN:[id number] DOB: 06-23-1971 DOA: 04/24/2023 PCP: [name], MD    Brief Narrative:  [name] is a 52 y.o. female  with medical history significant of multiple sclerosis, bedbound status, paraplegia, left upper extremity weakness, s/p urostomy, who presented to the hospital because of a wound on her left buttock that started about 2 weeks prior to admission.  She had been receiving local wound care but she noticed increasing drainage from the left buttock wound.  She developed a fever with temperature up to 101 F at home about 2 days prior to admission.  She also complained of congested cough.   She tested positive for COVID-19 infection and she was started on Paxlovid.  She was started on IV steroids in the emergency department because she was hypoxic (86%) on room air.  CT abdomen pelvis showed left gluteal abscess (2.5 x 1.5 cm) and possible early osteomyelitis of the left ischium.  She was treated with empiric IV antibiotics.  8/6: Remains on IV antibiotics.  Pending ID follow-up.  8/7: Wound looks reassuring.  Appropriate healing.  De-escalated to oral antibiotics.  Case discussed with ID.  Patient still dependent on supplemental oxygen.  Only on 1 L however becomes short of breath without it.  Chest x-ray demonstrates possible right-sided effusion.  Ultrasound ordered for better characterization  8/8: Patient doing well overall.  Remains on 1 to 2 L nasal cannula.  Ultrasound negative for effusion.  No indication for thoracentesis  Assessment & Plan:   Principal Problem:   Left buttock abscess Active Problems:   COVID-19   Multiple sclerosis (HCC)   Obesity (BMI 30-39.9)   Neuropathic pain   Paraplegia (HCC)   Primary hypertension   Decubitus ulcer of sacral region, stage 3 (HCC)   History of ileal conduit   Gluteal abscess  Sepsis secondary to COVID-19 infection and left buttock abscess, possible early osteomyelitis of the  left ischium:  S/p I&D of left gluteal abscess on 04/27/2023.   Gram stain from the left buttock wound (from 04/24/2023) showed few gram-positive cocci, and culture showed Proteus mirabilis and E. coli.  Wound culture from 04/27/2023 showed Pseudomonas aeruginosa. Plan: Can continue IV cefepime and p.o. Flagyl for today.  Last dose of IV cefepime will be tonight.  Initiate p.o. ciprofloxacin 8/9.  No indication for PICC line.     Acute hypoxic respiratory failure COVID-19 infection Cough Possible right pleural effusion Patient still dependent on 1 to 2 L nasal cannula Chest ultrasound negative for effusion Plan: As patient continues to remain short of breath though improving will continue guaifenesin 1200 mg twice daily.  Rechallenge with Lasix 40 mg IV once.  Continue to stress I-S and flutter use.  Continue 3 times daily Tessalon.  As needed Hycodan.  If able to wean from oxygen anticipate discharge 8/9.  Hypokalemia:  Improved.       Multiple sclerosis, paraplegia, neuropathic pain:  Continue baclofen, gabapentin and Cymbalta.     History of ileal conduit, hypertension   DVT prophylaxis: SQ Lovenox Code Status: Full Family Communication: Family member on phone during my evaluation 8/6 Disposition Plan: Status is: Inpatient Remains inpatient appropriate because: Recovering osteomyelitis.  Continue dependency on nasal cannula.  Anticipate discharge 8/9   Level of care: Med-Surg  Consultants:  ID  Procedures:  Sacral wound debridement  Antimicrobials: Metronidazole Ciprofloxacin   Subjective: Patient seen and examined.  Resting in bed.  Reports feeling better.  Cough  and congestion improving  Objective: Vitals:   05/03/23 1624 05/03/23 2111 05/04/23 0558 05/04/23 0733  BP: (!) 116/54 125/65 113/61 133/64  Pulse: (!) 106 99 83 78  Resp: 16 18 20 20   Temp: 98.7 F (37.1 C) 98.6 F (37 C) 97.9 F (36.6 C) 98.2 F (36.8 C)  TempSrc: Oral Oral Oral   SpO2: 96%  100% 100% 100%  Weight:      Height:        Intake/Output Summary (Last 24 hours) at 05/04/2023 1012 Last data filed at 05/04/2023 0559 Gross per 24 hour  Intake 240 ml  Output 2125 ml  Net -1885 ml   Filed Weights   04/24/23 1841  Weight: 85.3 kg    Examination:  General exam: No acute distress Respiratory system: Improved air entry.  Normal work of breathing.  2 L Cardiovascular system: S1-S2, RRR, no murmurs, no pedal edema Gastrointestinal system: Soft NT/ND, normal bowel sounds Central nervous system: Alert and oriented. No focal neurological deficits. Extremities: Decreased power bilateral lower extremities Skin: Sacral wound Psychiatry: Judgement and insight appear normal. Mood & affect appropriate.     Data Reviewed: I have personally reviewed following labs and imaging studies  CBC: Recent Labs  Lab 04/29/23 0946  WBC 12.3*  NEUTROABS 8.3*  HGB 14.2  HCT 42.2  MCV 87.7  PLT 430*   Basic Metabolic Panel: Recent Labs  Lab 04/27/23 1048 04/29/23 0946 05/01/23 0635 05/02/23 0447  NA  --  138  --  140  K  --  3.7  --  4.1  CL  --  103  --  107  CO2  --  26  --  25  GLUCOSE  --  86  --  100*  BUN  --  17  --  12  CREATININE 0.53 0.45 0.57 0.53  CALCIUM  --  9.0  --  8.8*  MG  --  2.2  --   --    GFR: Estimated Creatinine Clearance: 85.9 mL/min (by C-G formula based on SCr of 0.53 mg/dL). Liver Function Tests: No results for input(s): "AST", "ALT", "ALKPHOS", "BILITOT", "PROT", "ALBUMIN" in the last 168 hours. No results for input(s): "LIPASE", "AMYLASE" in the last 168 hours. No results for input(s): "AMMONIA" in the last 168 hours. Coagulation Profile: No results for input(s): "INR", "PROTIME" in the last 168 hours. Cardiac Enzymes: No results for input(s): "CKTOTAL", "CKMB", "CKMBINDEX", "TROPONINI" in the last 168 hours. BNP (last 3 results) No results for input(s): "PROBNP" in the last 8760 hours. HbA1C: No results for input(s): "HGBA1C" in  the last 72 hours. CBG: Recent Labs  Lab 05/03/23 0805 05/03/23 1123 05/03/23 1626 05/03/23 2110 05/04/23 0732  GLUCAP 100* 140* 90 118* 101*   Lipid Profile: No results for input(s): "CHOL", "HDL", "LDLCALC", "TRIG", "CHOLHDL", "LDLDIRECT" in the last 72 hours. Thyroid Function Tests: No results for input(s): "TSH", "T4TOTAL", "FREET4", "T3FREE", "THYROIDAB" in the last 72 hours. Anemia Panel: No results for input(s): "VITAMINB12", "FOLATE", "FERRITIN", "TIBC", "IRON", "RETICCTPCT" in the last 72 hours. Sepsis Labs: No results for input(s): "PROCALCITON", "LATICACIDVEN" in the last 168 hours.   Recent Results (from the past 240 hour(s))  Resp panel by RT-PCR (RSV, Flu A&B, Covid) Abscess     Status: Abnormal   Collection Time: 04/24/23  9:09 PM   Specimen: Abscess; Nasal Swab  Result Value Ref Range Status   SARS Coronavirus 2 by RT PCR POSITIVE (A) NEGATIVE Final    Comment: (NOTE) SARS-CoV-2 target  nucleic acids are DETECTED.  The SARS-CoV-2 RNA is generally detectable in upper respiratory specimens during the acute phase of infection. Positive results are indicative of the presence of the identified virus, but do not rule out bacterial infection or co-infection with other pathogens not detected by the test. Clinical correlation with patient history and other diagnostic information is necessary to determine patient infection status. The expected result is Negative.  Fact Sheet for Patients: BloggerCourse.com  Fact Sheet for Healthcare Providers: SeriousBroker.it  This test is not yet approved or cleared by the [place] FDA and  has been authorized for detection and/or diagnosis of SARS-CoV-2 by FDA under an Emergency Use Authorization (EUA).  This EUA will remain in effect (meaning this test can be used) for the duration of  the COVID-19 declaration under Section 564(b)(1) of the A ct, 21 U.S.C. section  360bbb-3(b)(1), unless the authorization is terminated or revoked sooner.     Influenza A by PCR NEGATIVE NEGATIVE Final   Influenza B by PCR NEGATIVE NEGATIVE Final    Comment: (NOTE) The Xpert Xpress SARS-CoV-2/FLU/RSV plus assay is intended as an aid in the diagnosis of influenza from Nasopharyngeal swab specimens and should not be used as a sole basis for treatment. Nasal washings and aspirates are unacceptable for Xpert Xpress SARS-CoV-2/FLU/RSV testing.  Fact Sheet for Patients: BloggerCourse.com  Fact Sheet for Healthcare Providers: SeriousBroker.it  This test is not yet approved or cleared by the [place] FDA and has been authorized for detection and/or diagnosis of SARS-CoV-2 by FDA under an Emergency Use Authorization (EUA). This EUA will remain in effect (meaning this test can be used) for the duration of the COVID-19 declaration under Section 564(b)(1) of the Act, 21 U.S.C. section 360bbb-3(b)(1), unless the authorization is terminated or revoked.     Resp Syncytial Virus by PCR NEGATIVE NEGATIVE Final    Comment: (NOTE) Fact Sheet for Patients: BloggerCourse.com  Fact Sheet for Healthcare Providers: SeriousBroker.it  This test is not yet approved or cleared by the [place] FDA and has been authorized for detection and/or diagnosis of SARS-CoV-2 by FDA under an Emergency Use Authorization (EUA). This EUA will remain in effect (meaning this test can be used) for the duration of the COVID-19 declaration under Section 564(b)(1) of the Act, 21 U.S.C. section 360bbb-3(b)(1), unless the authorization is terminated or revoked.  Performed at [facility], [address] [place], [place] [address]   Culture, blood (Routine x 2)     Status: None   Collection Time: 04/24/23  9:30 PM   Specimen: BLOOD  Result Value Ref Range Status   Specimen  Description BLOOD BLOOD LEFT ARM  Final   Special Requests   Final    BOTTLES DRAWN AEROBIC AND ANAEROBIC Blood Culture results may not be optimal due to an inadequate volume of blood received in culture bottles   Culture   Final    NO GROWTH 5 DAYS Performed at [facility], [address], [place], [place] [address]    Report Status 04/29/2023 FINAL  Final  Aerobic/Anaerobic Culture w Gram Stain (surgical/deep wound)     Status: None   Collection Time: 04/24/23 11:35 PM   Specimen: Abscess  Result Value Ref Range Status   Specimen Description   Final    ABSCESS Performed at [facility], [address], [place], [place] [address]    Special Requests   Final    NONE Performed at [facility], [address] [place],  [place] [address]    Gram Stain   Final    RARE WBC PRESENT, PREDOMINANTLY PMN FEW GRAM POSITIVE COCCI    Culture   Final    MODERATE PROTEUS MIRABILIS MODERATE ESCHERICHIA COLI NO ANAEROBES ISOLATED Performed at [facility], [address], [place], [place] [address]    Report Status 05/01/2023 FINAL  Final   Organism ID, Bacteria PROTEUS MIRABILIS  Final   Organism ID, Bacteria ESCHERICHIA COLI  Final      Susceptibility   Escherichia coli - MIC*    AMPICILLIN >=32 RESISTANT Resistant     CEFEPIME <=0.12 SENSITIVE Sensitive     CEFTAZIDIME <=1 SENSITIVE Sensitive     CEFTRIAXONE <=0.25 SENSITIVE Sensitive     CIPROFLOXACIN <=0.25 SENSITIVE Sensitive     GENTAMICIN <=1 SENSITIVE Sensitive     IMIPENEM <=0.25 SENSITIVE Sensitive     TRIMETH/SULFA <=20 SENSITIVE Sensitive     AMPICILLIN/SULBACTAM >=32 RESISTANT Resistant     PIP/TAZO <=4 SENSITIVE Sensitive     * MODERATE ESCHERICHIA COLI   Proteus mirabilis - MIC*    AMPICILLIN <=2 SENSITIVE Sensitive     CEFEPIME <=0.12 SENSITIVE Sensitive     CEFTAZIDIME <=1 SENSITIVE Sensitive     CEFTRIAXONE <=0.25 SENSITIVE Sensitive     CIPROFLOXACIN <=0.25 SENSITIVE Sensitive      GENTAMICIN <=1 SENSITIVE Sensitive     IMIPENEM 2 SENSITIVE Sensitive     TRIMETH/SULFA <=20 SENSITIVE Sensitive     AMPICILLIN/SULBACTAM <=2 SENSITIVE Sensitive     PIP/TAZO <=4 SENSITIVE Sensitive     * MODERATE PROTEUS MIRABILIS  Culture, blood (Routine x 2)     Status: None   Collection Time: 04/25/23  2:44 AM   Specimen: BLOOD  Result Value Ref Range Status   Specimen Description BLOOD LEFT HAND  Final   Special Requests   Final    BOTTLES DRAWN AEROBIC AND ANAEROBIC Blood Culture adequate volume   Culture   Final    NO GROWTH 5 DAYS Performed at [facility], [address], [place], [place] [address]    Report Status 04/30/2023 FINAL  Final  Aerobic/Anaerobic Culture w Gram Stain (surgical/deep wound)     Status: None   Collection Time: 04/27/23  6:27 PM   Specimen: Buttocks; Abscess  Result Value Ref Range Status   Specimen Description   Final    BUTTOCKS Performed at [facility], [address] [place], [place] [address]    Special Requests   Final    NONE Performed at [facility], [address] [place], [place] [address]    Gram Stain NO WBC SEEN NO ORGANISMS SEEN   Final   Culture   Final    RARE PSEUDOMONAS AERUGINOSA RARE PROTEUS MIRABILIS NO ANAEROBES ISOLATED Performed at [facility], [address] [place], [place] [address]    Report Status 05/02/2023 FINAL  Final   Organism ID, Bacteria PSEUDOMONAS AERUGINOSA  Final   Organism ID, Bacteria PROTEUS MIRABILIS  Final      Susceptibility   Pseudomonas aeruginosa - MIC*    CEFTAZIDIME 4 SENSITIVE Sensitive     CIPROFLOXACIN <=0.25 SENSITIVE Sensitive     GENTAMICIN 2 SENSITIVE Sensitive     IMIPENEM 2 SENSITIVE Sensitive     PIP/TAZO 16 SENSITIVE Sensitive     CEFEPIME 2 SENSITIVE Sensitive     * RARE PSEUDOMONAS AERUGINOSA   Proteus mirabilis - MIC*    AMPICILLIN <=2 SENSITIVE Sensitive  CEFEPIME <=0.12 SENSITIVE Sensitive     CEFTAZIDIME <=1  SENSITIVE Sensitive     CEFTRIAXONE <=0.25 SENSITIVE Sensitive     CIPROFLOXACIN <=0.25 SENSITIVE Sensitive     GENTAMICIN <=1 SENSITIVE Sensitive     IMIPENEM 2 SENSITIVE Sensitive     TRIMETH/SULFA <=20 SENSITIVE Sensitive     AMPICILLIN/SULBACTAM <=2 SENSITIVE Sensitive     PIP/TAZO <=4 SENSITIVE Sensitive     * RARE PROTEUS MIRABILIS         Radiology Studies: [place] CHEST (PLEURAL EFFUSION)  Result Date: 05/04/2023 CLINICAL DATA:  Pleural fluid evaluation EXAM: CHEST ULTRASOUND COMPARISON:  None Available. FINDINGS: Limited sonographic exam of the bilateral chest was performed for fluid assessment. Images demonstrate no pleural effusion on either side. IMPRESSION: No pleural effusion identified bilaterally. Electronically Signed   By: Olive Bass M.D.   On: 05/04/2023 08:31   DG Chest Port 1 View  Result Date: 05/02/2023 CLINICAL DATA:  52 year old female with cough. EXAM: PORTABLE CHEST 1 VIEW COMPARISON:  Portable chest 04/26/2023 and earlier. FINDINGS: Portable AP upright view at 1023 hours. Increased veiling opacity at the right lung base is more suspicious for increasing pleural effusion rather than lower lung consolidation, and no air bronchograms are identified. Upper lungs remain clear. No pneumothorax or pulmonary edema. Mediastinal contours are within normal limits. Visible bowel gas pattern is stable including splenic flexure retained stool. No acute osseous abnormality identified. IMPRESSION: 1. Worsening ventilation at the right lung base, nonspecific but more suggestive of increasing pleural effusion than consolidation. Recommend confirmation with ultrasound prior to any attempted thoracentesis. 2. No other acute cardiopulmonary abnormality. Electronically Signed   By: Odessa Fleming M.D.   On: 05/02/2023 12:49        Scheduled Meds:  amantadine  200 mg Oral BID   baclofen  20 mg Oral QID   benzonatate  200 mg Oral TID   cholecalciferol  1,000 Units Oral Daily   DULoxetine   60 mg Oral Daily   enoxaparin (LOVENOX) injection  0.5 mg/kg Subcutaneous Q24H   furosemide  40 mg Intravenous Once   gabapentin  200 mg Oral QID   guaiFENesin  1,200 mg Oral BID   insulin aspart  0-15 Units Subcutaneous TID WC   insulin aspart  0-5 Units Subcutaneous QHS   levalbuterol  1-2 puff Inhalation Q8H   metroNIDAZOLE  500 mg Oral Q12H   pantoprazole  40 mg Oral BID   potassium chloride  40 mEq Oral Daily   senna-docusate  2 tablet Oral BID   sodium chloride flush  3 mL Intravenous Q12H   Continuous Infusions:  ceFEPime (MAXIPIME) IV 2 g (05/04/23 0618)     LOS: 10 days     Tresa Haymaker, MD [facility]   If 7PM-7AM, please contact night-coverage  05/04/2023, 10:12 AM

## 2023-05-04 NOTE — TOC Initial Note (Addendum)
Transition of Care [facility]) - Initial/Assessment Note    Patient Details  Name: [name] MRN: [id number] Date of Birth: [birth year]  Transition of Care [facility]) CM/SW Contact:    Garret Reddish, RN Phone Number: 05/04/2023, 11:10 AM  Clinical Narrative:    Chart reviewed. Noted that patient was admitted with Sepsis secondary to COVID-19 infection and left buttock abscess, possible early osteomyelitis of the left ischium.  Patient has PMH of Multiple sclerosis, paraplegia, neuropathic pain. Patient has been treated with Empiric IV antibiotics. Patient continues to have need o2.  Will try and wean off 02 prior to discharge.    I have spoken with [name].  She informs me that prior to admission she lived at home with her mother.  She reports that prior to her being diagnosed with MS she was working a full time job and 2 part time jobs.  She informs me that she now not able to walk and confined to a the bed.  She informs me that she does receive a disability check.  She reports that her mother is in her 59's and she has siblings and nieces that also assist with her care at home.  Patient reports that she has an aid the assist with her care from 9 am to 2 pm daily.   [name] reports that she is connected with an Research scientist (medical) agency that pays for the in home aid and she is able to hire the aid that cares for her. [name] reports that she is active with [facility] for wound care.  She reports that nurse from [facility] goes out on M-W-F.  Her family and in home aid assist with the dressing changes when Home Health nurse is not able too.  [name] reports that he sister works at the wound care clinic and she goes their every Tuesday.   [name] reported that she does have a handicapped [name] and her aide takes her to appointments.  She reports she has also uses transport van from [employer] if her [name] needs work done to it.  She reports that her brother usually assist with  her transport [name] if she needs mechanical work done to her [name].    [name] uses [employer] for medications that are received via mail order.  [facility] uses [facility] if medications are needed right away.    [name] informs me that she will need assistance with her hospital bill as she is on a fixed income.  I have sent email to [name] with financial department that patient has requested assisted with hospital bill.    I have informed [name] with [facility] that patient in currently in the hospital. [name] reports that patient is active for home health RN.  [name] reports that on discharge she will need EMS transport.   TOC will continue to follow for discharge planning.                     Expected Discharge Plan: Home w Home Health Services Barriers to Discharge: No Barriers Identified   Patient Goals and CMS Choice   CMS Medicare.gov Compare Post Acute Care list provided to:: Patient Choice offered to / list presented to : Patient      Expected Discharge Plan and Services   Discharge Planning Services: CM Consult Post Acute Care Choice: Home Health (Patient is active with [facility] for Home Health RN) Living arrangements for the past 2 months:  Skilled Nursing Facility                           HH Arranged: RN [facility] Agency: [facility] Date HH Agency Contacted: 05/04/23 Time HH Agency Contacted: 1000 Representative spoke with at [facility] Agency: [name]  Prior Living Arrangements/Services Living arrangements for the past 2 months: Skilled Nursing Facility Lives with:: Parents (Patient lives with her mother.) Patient language and need for interpreter reviewed:: Yes Do you feel safe going back to the place where you live?: Yes      Need for Family Participation in Patient Care: No (Comment) (Patient has supportive mother and siblings.) Care giver support system in place?: Yes (comment) Current home services: DME, Home RN, Homehealth aide (Patient has a  Nurse, adult, Power Chair, Hospital bed, shower chair, Bars in shower.  Patient has in home aid from 9am -2pm daily.  [facility] provides home health nurse for wound care.)    Activities of Daily Living Home Assistive Devices/Equipment: Hospital bed, Eyeglasses ADL Screening (condition at time of admission) Patient's cognitive ability adequate to safely complete daily activities?: Yes Is the patient deaf or have difficulty hearing?: No Does the patient have difficulty seeing, even when wearing glasses/contacts?: No Does the patient have difficulty concentrating, remembering, or making decisions?: No Patient able to express need for assistance with ADLs?: Yes Does the patient have difficulty dressing or bathing?: Yes Independently performs ADLs?: No Communication: Independent Dressing (OT): Needs assistance Is this a change from baseline?: Pre-admission baseline Grooming: Needs assistance Is this a change from baseline?: Pre-admission baseline Feeding: Independent Bathing: Needs assistance Is this a change from baseline?: Pre-admission baseline Toileting: Needs assistance Is this a change from baseline?: Pre-admission baseline In/Out Bed: Needs assistance Is this a change from baseline?: Pre-admission baseline Walks in Home: Needs assistance Is this a change from baseline?: Pre-admission baseline Does the patient have difficulty walking or climbing stairs?: Yes Weakness of Legs: Both Weakness of Arms/Hands: Both  Permission Sought/Granted                  Emotional Assessment   Attitude/Demeanor/Rapport: Engaged Affect (typically observed): Appropriate Orientation: : Oriented to Self, Oriented to Place, Oriented to  Time, Oriented to Situation      Admission diagnosis:  Abscess [L02.91] Urinary tract infection without hematuria, site unspecified [N39.0] Sepsis, due to unspecified organism, unspecified whether acute organ dysfunction present (HCC) [A41.9] COVID-19  [U07.1] Patient Active Problem List   Diagnosis Date Noted   Gluteal abscess 04/27/2023   History of ileal conduit 04/25/2023   Left buttock abscess 04/24/2023   COVID-19 04/24/2023   Decubitus ulcer of sacral region, stage 3 (HCC) 03/16/2023   Cellulitis 03/15/2023   Complicated UTI (urinary tract infection) 01/11/2023   Acute metabolic encephalopathy 01/11/2023   Chronic diastolic CHF (congestive heart failure) (HCC) 01/11/2023   Obesity (BMI 30-39.9) 01/11/2023   Depression 01/11/2023   Constipation 01/11/2023   Primary hypertension 02/07/2022   Pressure injury of skin 12/05/2021   Nausea and vomiting 12/04/2021   Community acquired pneumonia 12/03/2021   Difficult intravenous access 12/03/2021   Weakness of upper extremity 09/03/2021   Sepsis (HCC) 09/02/2021   Paraplegia (HCC) 02/15/2018   Altered mental status 04/10/2017   Lower urinary tract infectious disease 08/09/2015   Exacerbation of multiple sclerosis (HCC) 08/09/2015   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 [place] [address] Phone: [phone] Fax: [phone]  [facility] [address], [place] - [address] [place] [address] Phone: [phone] Fax: [phone]     Social Determinants of Health (SDOH) Social History: SDOH Screenings   Food Insecurity: Patient Declined (04/25/2023)  Housing: Patient Declined (04/25/2023)  Transportation Needs: Patient Declined (04/25/2023)  Utilities: Patient  Declined (04/25/2023)  Financial Resource Strain: Medium Risk (11/03/2022)   Received from [facility], [facility]  Physical Activity: Inactive (11/03/2022)   Received from [facility], [facility]  Social Connections: Moderately Isolated (11/03/2022)   Received from [facility], [facility]  Stress: No Stress Concern Present (11/03/2022)   Received from [facility], [facility]  Tobacco Use: Low Risk  (04/25/2023)  Health Literacy: Medium Risk (11/03/2022)   Received from [facility], [facility]   SDOH Interventions:     Readmission Risk Interventions     No data to display

## 2023-05-05 DIAGNOSIS — L0231 Cutaneous abscess of buttock: Secondary | ICD-10-CM | POA: Diagnosis not present

## 2023-05-05 LAB — CBC WITH DIFFERENTIAL/PLATELET
Abs Immature Granulocytes: 0.29 10*3/uL — ABNORMAL HIGH (ref 0.00–0.07)
Basophils Absolute: 0 10*3/uL (ref 0.0–0.1)
Basophils Relative: 0 %
Eosinophils Absolute: 0.2 10*3/uL (ref 0.0–0.5)
Eosinophils Relative: 1 %
HCT: 38.1 % (ref 36.0–46.0)
Hemoglobin: 12.4 g/dL (ref 12.0–15.0)
Immature Granulocytes: 2 %
Lymphocytes Relative: 19 %
Lymphs Abs: 2.5 10*3/uL (ref 0.7–4.0)
MCH: 29.7 pg (ref 26.0–34.0)
MCHC: 32.5 g/dL (ref 30.0–36.0)
MCV: 91.1 fL (ref 80.0–100.0)
Monocytes Absolute: 1.9 10*3/uL — ABNORMAL HIGH (ref 0.1–1.0)
Monocytes Relative: 15 %
Neutro Abs: 8.3 10*3/uL — ABNORMAL HIGH (ref 1.7–7.7)
Neutrophils Relative %: 63 %
Platelets: 422 10*3/uL — ABNORMAL HIGH (ref 150–400)
RBC: 4.18 MIL/uL (ref 3.87–5.11)
RDW: 14.6 % (ref 11.5–15.5)
WBC: 13.2 10*3/uL — ABNORMAL HIGH (ref 4.0–10.5)
nRBC: 0 % (ref 0.0–0.2)

## 2023-05-05 LAB — BASIC METABOLIC PANEL
Anion gap: 8 (ref 5–15)
BUN: 14 mg/dL (ref 6–20)
CO2: 26 mmol/L (ref 22–32)
Calcium: 8.9 mg/dL (ref 8.9–10.3)
Chloride: 103 mmol/L (ref 98–111)
Creatinine, Ser: 0.52 mg/dL (ref 0.44–1.00)
GFR, Estimated: >60 mL/min (ref >60)
Glucose, Bld: 104 mg/dL — ABNORMAL HIGH (ref 70–99)
Potassium: 3.8 mmol/L (ref 3.5–5.1)
Sodium: 137 mmol/L (ref 135–145)

## 2023-05-05 LAB — GLUCOSE, CAPILLARY
Glucose-Capillary: 113 mg/dL — ABNORMAL HIGH (ref 70–99)
Glucose-Capillary: 93 mg/dL (ref 70–99)

## 2023-05-05 MED ORDER — ALBUTEROL SULFATE HFA 108 (90 BASE) MCG/ACT IN AERS: 2.0000 | INHALATION_SPRAY | Freq: Four times a day (QID) | RESPIRATORY_TRACT | 2 refills | Status: DC | PRN

## 2023-05-05 MED ORDER — METRONIDAZOLE 500 MG PO TABS: 500.0000 mg | ORAL_TABLET | Freq: Two times a day (BID) | ORAL | 0 refills | Status: AC

## 2023-05-05 MED ORDER — GUAIFENESIN ER 600 MG PO TB12: 1200.0000 mg | ORAL_TABLET | Freq: Two times a day (BID) | ORAL | 0 refills | Status: DC

## 2023-05-05 MED ORDER — ONDANSETRON HCL 4 MG PO TABS: 4.0000 mg | ORAL_TABLET | Freq: Three times a day (TID) | ORAL | Status: DC | PRN

## 2023-05-05 MED ORDER — CIPROFLOXACIN HCL 500 MG PO TABS: 500.0000 mg | ORAL_TABLET | Freq: Two times a day (BID) | ORAL | 0 refills | Status: AC

## 2023-05-05 MED ORDER — BENZONATATE 200 MG PO CAPS: 200.0000 mg | ORAL_CAPSULE | Freq: Three times a day (TID) | ORAL | 0 refills | Status: DC

## 2023-05-05 NOTE — Plan of Care (Signed)

## 2023-05-05 NOTE — TOC Transition Note (Signed)
Transition of Care [facility]) - CM/SW Discharge Note   Patient Details  Name: [name] MRN: [id number] Date of Birth: 1971-09-04  Transition of Care [facility]) CM/SW Contact:  Garret Reddish, RN Phone Number: 05/05/2023, 11:59 AM   Clinical Narrative:   Chart reviewed.  Noted that patient has orders for discharge for today.   I have informed [name] with [facility] that patient will be a discharge for today.  [facility] will be providing home health services for home health PT.     I have arranged ambulance transport with [facility] for transport home today.    I have informed staff nurse of the above information.      Final next level of care: Home w Home Health Services Barriers to Discharge: No Barriers Identified   Patient Goals and CMS Choice CMS Medicare.gov Compare Post Acute Care list provided to:: Patient Choice offered to / list presented to : Patient  Discharge Placement                  Patient to be transferred to facility by: [facility]   Patient and family notified of of transfer: 05/05/23  Discharge Plan and Services Additional resources added to the After Visit Summary for     Discharge Planning Services: CM Consult Post Acute Care Choice: Home Health (Patient is active with [facility] for Home Health RN)                    HH Arranged: RN [facility] Agency: [facility] Date HH Agency Contacted: 05/05/23 Time HH Agency Contacted: 1100 Representative spoke with at [facility] Agency: [name]  Social Determinants of Health (SDOH) Interventions SDOH Screenings   Food Insecurity: Patient Declined (04/25/2023)  Housing: Patient Declined (04/25/2023)  Transportation Needs: Patient Declined (04/25/2023)  Utilities: Patient Declined (04/25/2023)  Financial Resource Strain: Medium Risk (11/03/2022)   Received from [facility], [facility]  Physical Activity: Inactive (11/03/2022)   Received from [facility], [facility]   Social Connections: Moderately Isolated (11/03/2022)   Received from [facility], [facility]  Stress: No Stress Concern Present (11/03/2022)   Received from [facility], [facility]  Tobacco Use: Low Risk  (04/25/2023)  Health Literacy: Medium Risk (11/03/2022)   Received from [facility], [facility] Care     Readmission Risk Interventions     No data to display

## 2023-05-05 NOTE — TOC Progression Note (Signed)
Transition of Care [facility]) - Progression Note    Patient Details  Name: [name] MRN: [id number] Date of Birth: 1971/05/12  Transition of Care [facility]) CM/SW Contact  Garret Reddish, RN Phone Number: 05/05/2023, 1:51 PM  Clinical Narrative:   Informed by [name] with [facility] that Home Health nursing will be able to resume home health nursing care on August 12 th 2024.  I have made provider aware of the above information.  Patient has family and caregivers that will assist with dressing changes prior to Monday August 12th 2024.    Expected Discharge Plan: Home w Home Health Services Barriers to Discharge: No Barriers Identified  Expected Discharge Plan and Services   Discharge Planning Services: CM Consult Post Acute Care Choice: Home Health (Patient is active with [facility] for Home Health RN) Living arrangements for the past 2 months: Skilled Nursing Facility Expected Discharge Date: 05/05/23                         HH Arranged: RN HH Agency: [facility] Date HH Agency Contacted: 05/05/23 Time HH Agency Contacted: 1100 Representative spoke with at [facility] Agency: [name]   Social Determinants of Health (SDOH) Interventions SDOH Screenings   Food Insecurity: Patient Declined (04/25/2023)  Housing: Patient Declined (04/25/2023)  Transportation Needs: Patient Declined (04/25/2023)  Utilities: Patient Declined (04/25/2023)  Financial Resource Strain: Medium Risk (11/03/2022)   Received from [facility], [facility]  Physical Activity: Inactive (11/03/2022)   Received from [facility], [facility]  Social Connections: Moderately Isolated (11/03/2022)   Received from [facility], [facility]  Stress: No Stress Concern Present (11/03/2022)   Received from [facility], [facility]  Tobacco Use: Low Risk  (04/25/2023)  Health Literacy: Medium Risk (11/03/2022)   Received from [facility], [facility] Care    Readmission Risk  Interventions     No data to display

## 2023-05-05 NOTE — Discharge Summary (Signed)
Physician Discharge Summary  [name] ZYS:[id number] DOB: 08/18/1971 DOA: 04/24/2023  PCP: [name], MD  Admit date: 04/24/2023 Discharge date: 05/05/2023  Admitted From: Home Disposition:  Home with home health  Recommendations for Outpatient Follow-up:  Follow up with PCP in 1-2 weeks Follow-up with wound care  Home Health: Yes RN, aide Equipment/Devices: None  Discharge Condition: Stable CODE STATUS: Full Diet recommendation: Regular  Brief/Interim Summary:  [name] is a 52 y.o. female  with medical history significant of multiple sclerosis, bedbound status, paraplegia, left upper extremity weakness, s/p urostomy, who presented to the hospital because of a wound on her left buttock that started about 2 weeks prior to admission.  She had been receiving local wound care but she noticed increasing drainage from the left buttock wound.  She developed a fever with temperature up to 101 F at home about 2 days prior to admission.  She also complained of congested cough.   She tested positive for COVID-19 infection and she was started on Paxlovid.  She was started on IV steroids in the emergency department because she was hypoxic (86%) on room air.  CT abdomen pelvis showed left gluteal abscess (2.5 x 1.5 cm) and possible early osteomyelitis of the left ischium.  She was treated with empiric IV antibiotics.   8/6: Remains on IV antibiotics.  Pending ID follow-up.   8/7: Wound looks reassuring.  Appropriate healing.  De-escalated to oral antibiotics.  Case discussed with ID.  Patient still dependent on supplemental oxygen.  Only on 1 L however becomes short of breath without it.  Chest x-ray demonstrates possible right-sided effusion.  Ultrasound ordered for better characterization   8/8: Patient doing well overall.  Remains on 1 to 2 L nasal cannula.  Ultrasound negative for effusion.  No indication for thoracentesis  Patient weaned to room air.  Respiratory status  improved.  Still with some cough and congestion.  No indication for continued hospitalization.  Wound appears good.  Seen by ID.  No indication for PICC line and IV antibiotics.  Will complete course of p.o. antibiotics ciprofloxacin and metronidazole on 8/13.   Discharge Diagnoses:  Principal Problem:   Left buttock abscess Active Problems:   COVID-19   Multiple sclerosis (HCC)   Obesity (BMI 30-39.9)   Neuropathic pain   Paraplegia (HCC)   Primary hypertension   Decubitus ulcer of sacral region, stage 3 (HCC)   History of ileal conduit   Gluteal abscess epsis secondary to COVID-19 infection and left buttock abscess, possible early osteomyelitis of the left ischium:  S/p I&D of left gluteal abscess on 04/27/2023.   Gram stain from the left buttock wound (from 04/24/2023) showed few gram-positive cocci, and culture showed Proteus mirabilis and E. coli.  Wound culture from 04/27/2023 showed Pseudomonas aeruginosa. Plan: Was maintained on cefepime and Flagyl intravenously in the hospital.  At time of discharge will de-escalate to ciprofloxacin and metronidazole orally.  Continue course of treatment through 8/13.     Acute hypoxic respiratory failure COVID-19 infection Cough Possible right pleural effusion Patient still dependent on 1 to 2 L nasal cannula Chest ultrasound negative for effusion Plan: Patient at time of discharge.  Will recommend guaifenesin 1200 mg twice daily at time of DC.  Albuterol MDI as needed.  Cough suppression.   Discharge Instructions  Discharge Instructions     Diet - low sodium heart healthy   Complete by: As directed    Discharge wound care:   Complete by:  As directed    Wound care  Daily      Comments: Left Gluteal Wound: pack wound daily with 1/2 inch packing, cover with gauze, and secure. Okay to use Mediplex dressing. Change daily and as needed   Increase activity slowly   Complete by: As directed       Allergies as of 05/05/2023        Reactions   Latex Swelling, Other (See Comments)   Reaction:  Facial swelling         Medication List     STOP taking these medications    potassium chloride 20 MEQ packet Commonly known as: KLOR-CON       TAKE these medications    acetaminophen 500 MG tablet Commonly known as: TYLENOL Take 500 mg by mouth every 6 (six) hours as needed.   albuterol 108 (90 Base) MCG/ACT inhaler Commonly known as: VENTOLIN HFA Inhale 2 puffs into the lungs every 6 (six) hours as needed for wheezing or shortness of breath.   amantadine 100 MG capsule Commonly known as: SYMMETREL Take 200 mg by mouth 2 (two) times daily.   Aquacel Ag Foam 6"X6" Pads Apply 1 each topically daily in the afternoon.   baclofen 20 MG tablet Commonly known as: LIORESAL Take 40 mg by mouth 2 (two) times daily.   benzonatate 200 MG capsule Commonly known as: TESSALON Take 1 capsule (200 mg total) by mouth 3 (three) times daily.   cholecalciferol 1000 units tablet Commonly known as: VITAMIN D Take 1,000 Units by mouth daily.   ciprofloxacin 500 MG tablet Commonly known as: CIPRO Take 1 tablet (500 mg total) by mouth 2 (two) times daily for 4 days.   Cranberry 500 MG Chew Chew 500 mg by mouth daily.   DULoxetine 60 MG capsule Commonly known as: CYMBALTA Take 60 mg by mouth daily.   Foam Dressing Circular Border Pads Apply 1 each topically every 3 (three) days.   gabapentin 100 MG capsule Commonly known as: NEURONTIN Take 200 mg by mouth every morning.   gabapentin 800 MG tablet Commonly known as: NEURONTIN Take 800 mg by mouth at bedtime.   gentamicin cream 0.1 % Commonly known as: GARAMYCIN Apply 1 Application topically daily.   guaiFENesin 600 MG 12 hr tablet Commonly known as: MUCINEX Take 2 tablets (1,200 mg total) by mouth 2 (two) times daily.   lactulose 10 GM/15ML solution Commonly known as: CHRONULAC Take 15 mLs by mouth daily as needed for mild constipation.   metroNIDAZOLE  500 MG tablet Commonly known as: FLAGYL Take 1 tablet (500 mg total) by mouth every 12 (twelve) hours for 4 days.   Ozempic (1 MG/DOSE) 4 MG/3ML Sopn Generic drug: Semaglutide (1 MG/DOSE) Inject 1 mg into the skin once a week. On Monday   pantoprazole 40 MG tablet Commonly known as: PROTONIX Take 1 tablet (40 mg total) by mouth 2 (two) times daily.   senna 8.6 MG tablet Commonly known as: SENOKOT Take 2 tablets by mouth 2 (two) times daily.   Stimulant Laxative 8.6-50 MG tablet Generic drug: senna-docusate Take 1 tablet by mouth at bedtime.               Discharge Care Instructions  (From admission, onward)           Start     Ordered   05/05/23 0000  Discharge wound care:       Comments: Wound care  Daily      Comments: Left Gluteal  Wound: pack wound daily with 1/2 inch packing, cover with gauze, and secure. Okay to use Mediplex dressing. Change daily and as needed   05/05/23 1610            Follow-up Information     [name], MD. Go on 05/19/2023.   Specialty: Internal Medicine Why: @9 :30am Contact information: [address] [place] [address] [phone]                Allergies  Allergen Reactions   Latex Swelling and Other (See Comments)    Reaction:  Facial swelling     Consultations: ID General Surgery   Procedures/Studies: [place] CHEST (PLEURAL EFFUSION)  Result Date: 05/04/2023 CLINICAL DATA:  Pleural fluid evaluation EXAM: CHEST ULTRASOUND COMPARISON:  None Available. FINDINGS: Limited sonographic exam of the bilateral chest was performed for fluid assessment. Images demonstrate no pleural effusion on either side. IMPRESSION: No pleural effusion identified bilaterally. Electronically Signed   By: Olive Bass M.D.   On: 05/04/2023 08:31   DG Chest Port 1 View  Result Date: 05/02/2023 CLINICAL DATA:  52 year old female with cough. EXAM: PORTABLE CHEST 1 VIEW COMPARISON:  Portable chest 04/26/2023 and earlier.  FINDINGS: Portable AP upright view at 1023 hours. Increased veiling opacity at the right lung base is more suspicious for increasing pleural effusion rather than lower lung consolidation, and no air bronchograms are identified. Upper lungs remain clear. No pneumothorax or pulmonary edema. Mediastinal contours are within normal limits. Visible bowel gas pattern is stable including splenic flexure retained stool. No acute osseous abnormality identified. IMPRESSION: 1. Worsening ventilation at the right lung base, nonspecific but more suggestive of increasing pleural effusion than consolidation. Recommend confirmation with ultrasound prior to any attempted thoracentesis. 2. No other acute cardiopulmonary abnormality. Electronically Signed   By: Odessa Fleming M.D.   On: 05/02/2023 12:49   DG Chest Port 1 View  Result Date: 04/26/2023 CLINICAL DATA:  Cough. EXAM: PORTABLE CHEST 1 VIEW COMPARISON:  Chest radiograph dated 04/24/2023. chest CT dated 12/03/2021. FINDINGS: Linear right lung base atelectasis/scarring. No new consolidation. There is no pleural effusion or pneumothorax. The cardiac silhouette is within normal limits. No acute osseous pathology. IMPRESSION: No active disease. Electronically Signed   By: Elgie Collard M.D.   On: 04/26/2023 01:08   CT ABDOMEN PELVIS W CONTRAST  Result Date: 04/25/2023 CLINICAL DATA:  Sacral wound EXAM: CT ABDOMEN AND PELVIS WITH CONTRAST TECHNIQUE: Multidetector CT imaging of the abdomen and pelvis was performed using the standard protocol following bolus administration of intravenous contrast. RADIATION DOSE REDUCTION: This exam was performed according to the departmental dose-optimization program which includes automated exposure control, adjustment of the mA and/or kV according to patient size and/or use of iterative reconstruction technique. CONTRAST:  OMNIPAQUE IOHEXOL 300 MG/ML  SOLN COMPARISON:  CT 03/15/2023, 09/03/2021 FINDINGS: Lower chest: Lung bases  demonstrate bandlike scarring and chronic consolidation at the right base. No acute airspace disease. Hepatobiliary: No focal liver abnormality is seen. Gallstones. No biliary dilatation Pancreas: Unremarkable. No pancreatic ductal dilatation or surrounding inflammatory changes. Spleen: Normal in size without focal abnormality. Adrenals/Urinary Tract: Adrenal glands are within normal limits. Multifocal cortical scarring in the kidneys. No hydronephrosis. 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 Contrast fills the ileal conduit in the pelvis. Status post cystectomy. Stomach/Bowel: Stomach nonenlarged. No dilated small bowel. Large stool burden. No acute bowel wall thickening Vascular/Lymphatic: Moderate aortic atherosclerosis. No aneurysm. No  suspicious lymph nodes. Reproductive: Hysterectomy.  No adnexal mass Other: Negative for pelvic effusion or free air. Musculoskeletal: No fracture or malalignment. Posterior spinal hardware L5-S1. Soft tissue inflammatory changes within the left medial gluteal region. Interim finding of gas and fluid collection measuring 3 x 1.6 cm near the left ischial tuberosity. There may be some loss of bony cortex, series 7, image 74. IMPRESSION: 1. Status post cystectomy with right pelvic ileal conduit. No hydronephrosis. Multifocal cortical scarring of the kidneys. 2. Soft tissue inflammatory changes in the left medial gluteal region with interim finding of gas and minimal fluid collection measuring 2.5 x 1.5 cm near the left ischial tuberosity, suspicious for small abscess. Possible mild cortical loss at the left ischium, raising possibility of early osteomyelitis. 3. Gallstones. Electronically Signed   By: Jasmine Pang M.D.   On: 04/25/2023 00:45   DG Chest Port 1 View  Result Date: 04/24/2023 CLINICAL DATA:  Fever, sacral wound EXAM: PORTABLE CHEST 1 VIEW COMPARISON:  03/15/2023 FINDINGS: Single frontal view of the chest  demonstrates an unremarkable cardiac silhouette. Minimal hypoventilatory changes at the lung bases. No acute airspace disease, effusion, or pneumothorax. No acute bony abnormality. IMPRESSION: 1. No acute intrathoracic process. Electronically Signed   By: Sharlet Salina M.D.   On: 04/24/2023 20:30      Subjective: Seen and examined the day of discharge.  Stable no distress.  Appropriate for discharge home.  Discharge Exam: Vitals:   05/05/23 0516 05/05/23 0842  BP: 111/70 119/60  Pulse: 92 85  Resp: 19 18  Temp: 98.5 F (36.9 C) 98.3 F (36.8 C)  SpO2: 94% 99%   Vitals:   05/04/23 2014 05/04/23 2200 05/05/23 0516 05/05/23 0842  BP: (!) 102/50  111/70 119/60  Pulse: (!) 102  92 85  Resp: 18  19 18   Temp: 98.4 F (36.9 C)  98.5 F (36.9 C) 98.3 F (36.8 C)  TempSrc:      SpO2: 100% 97% 94% 99%  Weight:      Height:        General: Pt is alert, awake, not in acute distress Cardiovascular: RRR, S1/S2 +, no rubs, no gallops Respiratory: CTA bilaterally, no wheezing, no rhonchi Abdominal: Soft, NT, ND, bowel sounds + Extremities: Lower extremity wasting.  Paraplegia    The results of significant diagnostics from this hospitalization (including imaging, microbiology, ancillary and laboratory) are listed below for reference.     Microbiology: Recent Results (from the past 240 hour(s))  Aerobic/Anaerobic Culture w Gram Stain (surgical/deep wound)     Status: None   Collection Time: 04/27/23  6:27 PM   Specimen: Buttocks; Abscess  Result Value Ref Range Status   Specimen Description   Final    BUTTOCKS Performed at [facility], [address], [place], [place] [address]    Special Requests   Final    NONE Performed at [facility], [address] [place], [place] [address]    Gram Stain NO WBC SEEN NO ORGANISMS SEEN   Final   Culture   Final    RARE PSEUDOMONAS AERUGINOSA RARE PROTEUS MIRABILIS NO ANAEROBES ISOLATED Performed at [facility], [address], [place], [place] [address]    Report Status 05/02/2023 FINAL  Final   Organism ID, Bacteria PSEUDOMONAS AERUGINOSA  Final   Organism ID, Bacteria PROTEUS MIRABILIS  Final      Susceptibility   Pseudomonas aeruginosa - MIC*    CEFTAZIDIME 4 SENSITIVE Sensitive  CIPROFLOXACIN <=0.25 SENSITIVE Sensitive     GENTAMICIN 2 SENSITIVE Sensitive     IMIPENEM 2 SENSITIVE Sensitive     PIP/TAZO 16 SENSITIVE Sensitive     CEFEPIME 2 SENSITIVE Sensitive     * RARE PSEUDOMONAS AERUGINOSA   Proteus mirabilis - MIC*    AMPICILLIN <=2 SENSITIVE Sensitive     CEFEPIME <=0.12 SENSITIVE Sensitive     CEFTAZIDIME <=1 SENSITIVE Sensitive     CEFTRIAXONE <=0.25 SENSITIVE Sensitive     CIPROFLOXACIN <=0.25 SENSITIVE Sensitive     GENTAMICIN <=1 SENSITIVE Sensitive     IMIPENEM 2 SENSITIVE Sensitive     TRIMETH/SULFA <=20 SENSITIVE Sensitive     AMPICILLIN/SULBACTAM <=2 SENSITIVE Sensitive     PIP/TAZO <=4 SENSITIVE Sensitive     * RARE PROTEUS MIRABILIS     Labs: BNP (last 3 results) Recent Labs    01/11/23 1351  BNP 97.6   Basic Metabolic Panel: Recent Labs  Lab 04/29/23 0946 05/01/23 0635 05/02/23 0447 05/05/23 0943  NA 138  --  140 137  K 3.7  --  4.1 3.8  CL 103  --  107 103  CO2 26  --  25 26  GLUCOSE 86  --  100* 104*  BUN 17  --  12 14  CREATININE 0.45 0.57 0.53 0.52  CALCIUM 9.0  --  8.8* 8.9  MG 2.2  --   --   --    Liver Function Tests: No results for input(s): "AST", "ALT", "ALKPHOS", "BILITOT", "PROT", "ALBUMIN" in the last 168 hours. No results for input(s): "LIPASE", "AMYLASE" in the last 168 hours. No results for input(s): "AMMONIA" in the last 168 hours. CBC: Recent Labs  Lab 04/29/23 0946 05/05/23 0943  WBC 12.3* 13.2*  NEUTROABS 8.3* 8.3*  HGB 14.2 12.4  HCT 42.2 38.1  MCV 87.7 91.1  PLT 430* 422*   Cardiac Enzymes: No results for input(s): "CKTOTAL", "CKMB", "CKMBINDEX", "TROPONINI" in the last 168 hours. BNP: Invalid  input(s): "POCBNP" CBG: Recent Labs  Lab 05/04/23 0732 05/04/23 1133 05/04/23 1627 05/04/23 2018 05/05/23 0844  GLUCAP 101* 128* 129* 114* 113*   D-Dimer No results for input(s): "DDIMER" in the last 72 hours. Hgb A1c No results for input(s): "HGBA1C" in the last 72 hours. Lipid Profile No results for input(s): "CHOL", "HDL", "LDLCALC", "TRIG", "CHOLHDL", "LDLDIRECT" in the last 72 hours. Thyroid function studies No results for input(s): "TSH", "T4TOTAL", "T3FREE", "THYROIDAB" in the last 72 hours.  Invalid input(s): "FREET3" Anemia work up No results for input(s): "VITAMINB12", "FOLATE", "FERRITIN", "TIBC", "IRON", "RETICCTPCT" in the last 72 hours. Urinalysis    Component Value Date/Time   COLORURINE YELLOW (A) 04/24/2023 2052   APPEARANCEUR CLOUDY (A) 04/24/2023 2052   APPEARANCEUR Cloudy 10/05/2014 2100   LABSPEC 1.010 04/24/2023 2052   LABSPEC 1.009 10/05/2014 2100   PHURINE 8.0 04/24/2023 2052   GLUCOSEU NEGATIVE 04/24/2023 2052   GLUCOSEU Negative 10/05/2014 2100   HGBUR NEGATIVE 04/24/2023 2052   BILIRUBINUR NEGATIVE 04/24/2023 2052   BILIRUBINUR Negative 10/05/2014 2100   KETONESUR NEGATIVE 04/24/2023 2052   PROTEINUR NEGATIVE 04/24/2023 2052   NITRITE POSITIVE (A) 04/24/2023 2052   LEUKOCYTESUR LARGE (A) 04/24/2023 2052   LEUKOCYTESUR 3+ 10/05/2014 2100   Sepsis Labs Recent Labs  Lab 04/29/23 0946 05/05/23 0943  WBC 12.3* 13.2*   Microbiology Recent Results (from the past 240 hour(s))  Aerobic/Anaerobic Culture w Gram Stain (surgical/deep wound)     Status: None   Collection Time: 04/27/23  6:27 PM  Specimen: Buttocks; Abscess  Result Value Ref Range Status   Specimen Description   Final    BUTTOCKS Performed at [facility], [address] [place], [place] [address]    Special Requests   Final    NONE Performed at [facility], [address] [place], [place] [address]    Gram Stain NO WBC SEEN NO ORGANISMS SEEN    Final   Culture   Final    RARE PSEUDOMONAS AERUGINOSA RARE PROTEUS MIRABILIS NO ANAEROBES ISOLATED Performed at [facility], [address] [place], [place] [address]    Report Status 05/02/2023 FINAL  Final   Organism ID, Bacteria PSEUDOMONAS AERUGINOSA  Final   Organism ID, Bacteria PROTEUS MIRABILIS  Final      Susceptibility   Pseudomonas aeruginosa - MIC*    CEFTAZIDIME 4 SENSITIVE Sensitive     CIPROFLOXACIN <=0.25 SENSITIVE Sensitive     GENTAMICIN 2 SENSITIVE Sensitive     IMIPENEM 2 SENSITIVE Sensitive     PIP/TAZO 16 SENSITIVE Sensitive     CEFEPIME 2 SENSITIVE Sensitive     * RARE PSEUDOMONAS AERUGINOSA   Proteus mirabilis - MIC*    AMPICILLIN <=2 SENSITIVE Sensitive     CEFEPIME <=0.12 SENSITIVE Sensitive     CEFTAZIDIME <=1 SENSITIVE Sensitive     CEFTRIAXONE <=0.25 SENSITIVE Sensitive     CIPROFLOXACIN <=0.25 SENSITIVE Sensitive     GENTAMICIN <=1 SENSITIVE Sensitive     IMIPENEM 2 SENSITIVE Sensitive     TRIMETH/SULFA <=20 SENSITIVE Sensitive     AMPICILLIN/SULBACTAM <=2 SENSITIVE Sensitive     PIP/TAZO <=4 SENSITIVE Sensitive     * RARE PROTEUS MIRABILIS     Time coordinating discharge: Over 30 minutes  SIGNED:   Tresa Sinopoli, MD  [facility] 05/05/2023, 11:02 AM Pager   If 7PM-7AM, please contact night-coverage

## 2023-05-05 NOTE — Care Management Important Message (Signed)
Important Message  Patient Details  Name: [name] MRN: [id number] Date of Birth: [birth year]   Medicare Important Message Given:  Yes     Olegario Messier A  05/05/2023, 9:40 AM

## 2023-05-09 ENCOUNTER — Encounter: Payer: 59 | Attending: Physician Assistant | Admitting: Physician Assistant

## 2023-05-09 DIAGNOSIS — G35 Multiple sclerosis: Principal | ICD-10-CM

## 2023-05-09 DIAGNOSIS — G8220 Paraplegia, unspecified: Principal | ICD-10-CM

## 2023-05-09 DIAGNOSIS — Z789 Other specified health status: Principal | ICD-10-CM

## 2023-05-09 DIAGNOSIS — E669 Obesity, unspecified: Principal | ICD-10-CM

## 2023-05-09 DIAGNOSIS — L89324 Pressure ulcer of left buttock, stage 4: Secondary | ICD-10-CM | POA: Insufficient documentation

## 2023-05-09 NOTE — Progress Notes (Addendum)
ATTIE, LEVINE ([id number]) [id number] Page 1 of 6 Visit Report for 05/09/2023 Chief Complaint Document Details Patient Name: Date of Service: [name], [name] 05/09/2023 7:45 A M Medical Record Number: [id number] Patient Account Number: [id number] Date of Birth/Sex: Treating RN: 22-Nov-1970 (52 y.o. Skip Mayer Primary Care Provider: Ventura Pratt, [name] Other Clinician: Referring Provider: Treating Provider/Extender: [name] [name], [name] [name] in Treatment: 7 Information Obtained from: Patient Chief Complaint Pressure ulcer left gluteal region Electronic Signature(s) Signed: 05/09/2023 7:52:50 AM By: Samantha Derry PA-C Entered By: [name] on 05/09/2023 07:52:49 -------------------------------------------------------------------------------- HPI Details Patient Name: Date of Service: [name], [name] 05/09/2023 7:45 A M Medical Record Number: [id number] Patient Account Number: [id number] Date of Birth/Sex: Treating RN: 30-May-1971 (52 y.o. Skip Mayer Primary Care Provider: Ventura Pratt, [name] Other Clinician: Referring Provider: Treating Provider/Extender: [name] [name], [name] [name] in Treatment: 7 History of Present Illness HPI Description: 03-21-2023 upon evaluation today patient appears for initial evaluation regarding a wound that actually began around 03/07/2023. With that being said she has been having issues here with this area and then subsequently ended up in the hospital due to a high fever. She was discharged from the hospital and then started on oral antibiotics that was on 03-16-2023. The oral antibiotics are doxycycline and Keflex that she is currently taking. With that being said she was diagnosed with multiple sclerosis in 2000 [name] she has been in a well motorized wheelchair since 2014. She is a pleasant individual and unfortunately this is the issue that she is having currently with a pressure ulcer although this  is the good news is it is the first time she has had this happen she has an excellent caregiver that takes great care of her and the family is very attentive. 03-28-2023 upon evaluation today patient appears to be doing worse with regard to the irritation around the edges of her wound. The good news is the wound itself does not appear worse but the irritation is definitely not as good as even what I saw last week. In the interim since last week [name] her aide has actually broken her wrist and is no longer helping with the dressing changes I think this is part of the issue here. Fortunately I do not see any evidence of infection locally or systemically which is good news. 04-04-2023 upon evaluation today patient's wounds are actually showing signs of significant improvement which is great news. Fortunately I do not see any evidence of worsening overall I think that her family is making excellent progress towards getting this closed she is doing a good job offloading we will try to get the air-fluidized bed but that is just not working out her insurance at this point is denying this. She does have an alternating air mattress at home although it has been apparently on static mode I did advise this to be switched to alternating mode. [name], [name] ([id number]) [id number] Page 2 of 6 04-11-2023 upon evaluation today patient appears to be doing Pratt in regard to her wound. She has been tolerating the dressing changes without complication. Fortunately I do not see any signs of active infection locally or systemically at this time. Her wound does have a lot of blue-green drainage which has made concerned about Pseudomonas hanging out of the area. 7/23; improved wound which is a pressure ulcer on the left buttock in a patient with advanced MS. She has a surface on her  mattress at home as well as a Roho cushion for her wheelchair. We have been using gentamicin and Hydrofera Blue  rope. Depth is slightly Pratt today 05-09-2023 upon evaluation today patient presents for initial inspection in our clinic following her hospital admission she was in the hospital for just a little over a week having just gotten out on Friday of this past week. Fortunately she seems to be doing a lot Pratt although it is suspected that she may have possible early osteomyelitis though it was not definitive on CT scan. This CT scan was performed on 04-24-2023. She is on antibiotics at this point for that. She also did have an abscess that was cleaned out to the wound is deeper at this time. Fortunately I do not see any evidence of worsening overall which is great news. The wound is deeper but again I think it was already deep and this way it was just hidden underneath and we had not accessed the full depth of the wound previous. The patient's hospital admission was actually from 04-24-2023 through 05-05-2023. It looks like that on discharge she was placed on antibiotics due to having Pseudomonas, Proteus, and E. coli at 1 point during the admission. She did also have COVID during the admission. It looks like she was discharged with Cipro 500 mg on 05-05-2023. Electronic Signature(s) Signed: 05/09/2023 8:27:18 AM By: Samantha Derry PA-C Entered By: [name] on 05/09/2023 08:27:18 -------------------------------------------------------------------------------- Physical Exam Details Patient Name: Date of Service: [name], Samantha HATHEWAY. 05/09/2023 7:45 A M Medical Record Number: [id number] Patient Account Number: [id number] Date of Birth/Sex: Treating RN: July 29, 1971 (52 y.o. Skip Mayer Primary Care Provider: Ventura Pratt, [name] Other Clinician: Referring Provider: Treating Provider/Extender: [name] [name], [name] [name] in Treatment: 7 Constitutional Well-nourished and well-hydrated in no acute distress. Respiratory normal breathing without difficulty. Psychiatric this patient is able to make  decisions and demonstrates good insight into disease process. Alert and Oriented x 3. pleasant and cooperative. Notes Upon inspection patient's wound bed actually showed signs of good granulation epithelization at this point. Fortunately I do not see any signs of worsening overall and patient does seem to be doing well currently in regard to her wound. I am going to recommend that we Samantha ahead and have her use the [facility] this is can be packed to be bring those because it is late on top at this point I think this is the best way to Samantha currently. We may consider a wound VAC although the location is coming if we do a wound wound VAC she is probably enough to stay out of her chair in her bed most of the time for the time being. Electronic Signature(s) Signed: 05/09/2023 8:27:54 AM By: Samantha Derry PA-C Entered By: [name] on 05/09/2023 08:27:53 Physician Orders Details -------------------------------------------------------------------------------- [name] ([id number]) [id number] Page 3 of 6 Patient Name: Date of Service: [name], [name] 05/09/2023 7:45 A M Medical Record Number: [id number] Patient Account Number: [id number] Date of Birth/Sex: Treating RN: May 22, 1971 (52 y.o. 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 Home Health Home Health Company: - [facility] fax: [phone] [facility] for wound care. May utilize formulary equivalent dressing  for wound treatment orders unless otherwise specified. Home Health Nurse may visit PRN to address patients wound care needs. Scheduled days for dressing changes  to be completed; exception, patient has scheduled wound care visit that day. - Monday, Wednesday, Friday **Please direct any NON-WOUND related issues/requests for orders to patient's Primary Care Physician. **If current dressing causes regression in wound condition, may D/C ordered dressing product/s and apply Normal Saline Moist Dressing daily until next Wound Healing Center or Other MD appointment. **Notify Wound Healing Center of regression in wound condition at [phone]. Bathing/ [employer] wounds with antibacterial soap and water. Off-Loading Roho cushion for wheelchair Hospital bed/mattress A fluidized (Group 3) - Home Health, please order for patient. ir Turn and reposition every 2 hours - Float heels on pillow when in the bed. Additional Orders / Instructions Follow Nutritious Diet and Increase Protein Intake Wound Treatment Wound #1 - Gluteus Wound Laterality: Left Cleanser: Soap and Water (Home Health) 3 x Per Week/30 Days Discharge Instructions: Gently cleanse wound with antibacterial soap, rinse and pat dry prior to dressing wounds Prim Dressing: Hydrofera Blue Ready Transfer Foam, 2.5x2.5 (in/in) (Home Health) 3 x Per Week/30 Days ary Discharge Instructions: Pack undermining 9-3 lightly Secondary Dressing: ABD Pad 5x9 (in/in) 3 x Per Week/30 Days Discharge Instructions: Cover with ABD pad Secured With: Medipore T - 40M Medipore H Soft Cloth Surgical T ape ape, 2x2 (in/yd) 3 x Per Week/30 Days Patient Medications llergies: latex A Notifications Medication Indication Start End 05/11/2023 metronidazole DOSE 1 - oral 500 mg tablet - 1 tablet oral twice a day x 14 days 05/11/2023 Cipro DOSE 1 - oral 500 mg tablet - 1 tablet oral twice a day x 14 days Electronic Signature(s) Signed: 05/11/2023 10:02:59 AM By: Samantha Derry PA-C Previous Signature: 05/09/2023 3:44:51 PM Version By: Elliot Gurney, BSN, RN, CWS, Kim RN, BSN Previous Signature: 05/09/2023 4:30:30 PM Version  By: Samantha Derry PA-C Entered By: [name] on 05/11/2023 10:02:58 [name] ([id number]) [id number] Page 4 of 6 -------------------------------------------------------------------------------- Problem List Details Patient Name: Date of Service: [name], [name] 05/09/2023 7:45 A M Medical Record Number: [id number] Patient Account Number: [id number] Date of Birth/Sex: Treating RN: [birth year] (52 y.o. 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 05/09/2023 7:45 A M Medical Record Number: [id number] Patient Account Number: [id number] Date of Birth/Sex: Treating RN: 11/17/1970 (52 y.o. Skip Mayer Primary Care Provider: Ventura Pratt, [name] Other Clinician: Referring Provider: Treating Provider/Extender: [name] [name], [name] [name] in Treatment: 7 Subjective Chief Complaint Information obtained from Patient Pressure ulcer left gluteal region History of Present Illness (HPI) [name], [name] ([id number]) [id number] Page 5 of 6 03-21-2023 upon evaluation today patient appears for initial evaluation regarding a wound that actually began around 03/07/2023. With that being said she has been having issues here with this area and  then subsequently ended up in the hospital due to a high fever. She was discharged from the hospital and  then started on oral antibiotics that was on 03-16-2023. The oral antibiotics are doxycycline and Keflex that she is currently taking. With that being said she was diagnosed with multiple sclerosis in 2000 [name] she has been in a well motorized wheelchair since 2014. She is a pleasant individual and unfortunately this is the issue that she is having currently with a pressure ulcer although this is the good news is it is the first time she has had this happen she has an excellent caregiver that takes great care of her and the family is very attentive. 03-28-2023 upon evaluation today patient appears to be doing worse with regard to the irritation around the edges of her wound. The good news is the wound itself does not appear worse but the irritation is definitely not as good as even what I saw last week. In the interim since last week [name] her aide has actually broken her wrist and is no longer helping with the dressing changes I think this is part of the issue here. Fortunately I do not see any evidence of infection locally or systemically which is good news. 04-04-2023 upon evaluation today patient's wounds are actually showing signs of significant improvement which is great news. Fortunately I do not see any evidence of worsening overall I think that her family is making excellent progress towards getting this closed she is doing a good job offloading we will try to get the air-fluidized bed but that is just not working out her insurance at this point is denying this. She does have an alternating air mattress at home although it has been apparently on static mode I did advise this to be switched to alternating mode. 04-11-2023 upon evaluation today patient appears to be doing Pratt in regard to her wound. She has been tolerating the dressing changes without complication. Fortunately I do not see  any signs of active infection locally or systemically at this time. Her wound does have a lot of blue-green drainage which has made concerned about Pseudomonas hanging out of the area. 7/23; improved wound which is a pressure ulcer on the left buttock in a patient with advanced MS. She has a surface on her mattress at home as well as a Roho cushion for her wheelchair. We have been using gentamicin and Hydrofera Blue rope. Depth is slightly Pratt today 05-09-2023 upon evaluation today patient presents for initial inspection in our clinic following her hospital admission she was in the hospital for just a little over a week having just gotten out on Friday of this past week. Fortunately she seems to be doing a lot Pratt although it is suspected that she may have possible early osteomyelitis though it was not definitive on CT scan. This CT scan was performed on 04-24-2023. She is on antibiotics at this point for that. She also did have an abscess that was cleaned out to the wound is deeper at this time. Fortunately I do not see any evidence of worsening overall which is great news. The wound is deeper but again I think it was already deep and this way it was just hidden underneath and we had not accessed the full depth of the wound previous. The patient's hospital admission was actually from 04-24-2023 through 05-05-2023. It looks like that on discharge she was placed on antibiotics due to having Pseudomonas, Proteus, and E. coli at 1 point during the admission. She did also have COVID during the admission. It looks like she was discharged with Cipro 500 mg  on 05-05-2023. Objective Constitutional Well-nourished and well-hydrated in no acute distress. Vitals Time Taken: 8:06 AM, Height: 64 in, Weight: 166 lbs, BMI: 28.5, Temperature: 98.3 F, Pulse: 99 bpm, Respiratory Rate: 18 breaths/min, Blood Pressure: 131/78 mmHg. Respiratory normal breathing without difficulty. Psychiatric this patient is able to  make decisions and demonstrates good insight into disease process. Alert and Oriented x 3. pleasant and cooperative. General Notes: Upon inspection patient's wound bed actually showed signs of good granulation epithelization at this point. Fortunately I do not see any signs of worsening overall and patient does seem to be doing well currently in regard to her wound. I am going to recommend that we Samantha ahead and have her use the [facility] this is can be packed to be bring those because it is late on top at this point I think this is the best way to Samantha currently. We may consider a wound VAC although the location is coming if we do a wound wound VAC she is probably enough to stay out of her chair in her bed most of the time for the time being. Integumentary (Hair, Skin) Wound #1 status is Open. Original cause of wound was Blister. The date acquired was: 03/07/2023. The wound has been in treatment 7 weeks. The wound is located on the Left Gluteus. The wound measures 3cm length x 0.5cm width x 2.8cm depth; 1.178cm^2 area and 3.299cm^3 volume. There is Fat Layer (Subcutaneous Tissue) exposed. There is no tunneling noted, however, there is undermining starting at 9:00 and ending at 3:00 with a maximum distance of 4.5cm. There is a medium amount of serosanguineous drainage noted. There is large (67-100%) red granulation within the wound bed. There is a small (1-33%) amount of necrotic tissue within the wound bed including Adherent Slough. Assessment Active Problems ICD-10 Pressure ulcer of left buttock, stage 4 Multiple sclerosis Plan [name], [name] ([id number]) [id number] Page 6 of 6 1. I would recommend that we have the patient continue with the [facility] that we packed in the send at this point. 2 will continue bordered foam to cover. 3. I did discuss with her that she needs to try to stay off of this is much as possible she voiced understanding. 4. I am also  going to recommend the patient should continue to monitor for any signs of infection or worsening such as increased pain, fever, chills, nausea, vomiting, or diarrhea if any this occurs she should let us know. 5. With regard to antibiotics I think she should probably stay on these for the time being and not sure what the regimen is of not to delve into this and figure that out we may have to extend medications if need be. We will see patient back for reevaluation in 1 week here in the clinic. If anything worsens or changes patient will contact our office for additional recommendations. Electronic Signature(s) Signed: 05/09/2023 8:29:42 AM By: Samantha Derry PA-C Previous Signature: 05/09/2023 8:28:40 AM Version By: Samantha Derry PA-C Entered By: [name] on 05/09/2023 08:29:42 -------------------------------------------------------------------------------- SuperBill Details Patient Name: Date of Service: [name], [name] 05/09/2023 Medical Record Number: [id number] Patient Account Number: [id number] Date of Birth/Sex: Treating RN: Aug 19, 1971 (52 y.o. 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

## 2023-05-09 NOTE — Progress Notes (Addendum)
ANJOLIE, MCWHINNEY ([id number]) [id number] Page 1 of 9 Visit Report for 05/09/2023 Arrival Information Details Patient Name: Date of Service: Samantha, Pratt 05/09/2023 7:45 A M Medical Record Number: [id number] Patient Account Number: [id number] Date of Birth/Sex: Treating RN: 25-Apr-1971 (52 y.o. Skip Mayer Primary Care : [name], [name] Other Clinician: Referring : Treating /Extender: [name] GO [place], [place] [name] in Treatment: 7 Visit Information History Since Last Visit Added or deleted any medications: No Patient Arrived: Wheel Chair Has Dressing in Place as Prescribed: Yes Arrival Time: 08:03 Pain Present Now: No Accompanied By: [name] caregiver Transfer Assistance: Michiel Sites Lift Patient Identification Verified: Yes Secondary Verification Process Completed: Yes Patient Requires Transmission-Based Precautions: No Patient Has Alerts: Yes Patient Alerts: NOT DIABETIC Electronic Signature(s) Signed: 05/10/2023 2:28:05 PM By: Elliot Gurney, BSN, RN, CWS, Kim RN, BSN Entered By: Elliot Gurney, BSN, RN, CWS, Kim on 05/09/2023 08:04:08 -------------------------------------------------------------------------------- Clinic Level of Care Assessment Details Patient Name: Date of Service: [name], [name]. 05/09/2023 7:45 A M Medical Record Number: [id number] Patient Account Number: [id number] Date of Birth/Sex: Treating RN: 17-Jan-1971 (52 y.o. Skip Mayer Primary Care : [name], [name] Other Clinician: Referring : Treating /Extender: [name] [place], [place] [name] in Treatment: 7 Clinic Level of Care Assessment Items TOOL 4 Quantity Score []  - 0 Use when only an EandM is performed on FOLLOW-UP visit ASSESSMENTS - Nursing Assessment / Reassessment X- 1 10 Reassessment of Co-morbidities (includes updates in patient status) X- 1 5 Reassessment of Adherence to Treatment Plan ASSESSMENTS - Wound and  Skin A ssessment / Reassessment X - Simple Wound Assessment / Reassessment - one wound 1 5 [name], [name] ([id number]) [id number] Page 2 of 9 []  - 0 Complex Wound Assessment / Reassessment - multiple wounds []  - 0 Dermatologic / Skin Assessment (not related to wound area) ASSESSMENTS - Focused Assessment []  - 0 Circumferential Edema Measurements - multi extremities []  - 0 Nutritional Assessment / Counseling / Intervention []  - 0 Lower Extremity Assessment (monofilament, tuning fork, pulses) []  - 0 Peripheral Arterial Disease Assessment (using hand held doppler) ASSESSMENTS - Ostomy and/or Continence Assessment and Care []  - 0 Incontinence Assessment and Management []  - 0 Ostomy Care Assessment and Management (repouching, etc.) PROCESS - Coordination of Care X - Simple Patient / Family Education for ongoing care 1 15 []  - 0 Complex (extensive) Patient / Family Education for ongoing care X- 1 10 Staff obtains Chiropractor, Records, T Results / Process Orders est []  - 0 Staff telephones HHA, Nursing Homes / Clarify orders / etc []  - 0 Routine Transfer to another Facility (non-emergent condition) []  - 0 Routine Hospital Admission (non-emergent condition) []  - 0 New Admissions / Manufacturing engineer / Ordering NPWT Apligraf, etc. , []  - 0 Emergency Hospital Admission (emergent condition) X- 1 10 Simple Discharge Coordination []  - 0 Complex (extensive) Discharge Coordination PROCESS - Special Needs []  - 0 Pediatric / Minor Patient Management []  - 0 Isolation Patient Management []  - 0 Hearing / Language / Visual special needs []  - 0 Assessment of Community assistance (transportation, D/C planning, etc.) []  - 0 Additional assistance / Altered mentation []  - 0 Support Surface(s) Assessment (bed, cushion, seat, etc.) INTERVENTIONS - Wound Cleansing / Measurement X - Simple Wound Cleansing - one wound 1 5 []  - 0 Complex Wound Cleansing -  multiple wounds X- 1 5 Wound Imaging (photographs - any number of wounds) []  - 0 Wound Tracing (instead of photographs)  X- 1 5 Simple Wound Measurement - one wound []  - 0 Complex Wound Measurement - multiple wounds INTERVENTIONS - Wound Dressings []  - 0 Small Wound Dressing one or multiple wounds X- 1 15 Medium Wound Dressing one or multiple wounds []  - 0 Large Wound Dressing one or multiple wounds []  - 0 Application of Medications - topical []  - 0 Application of Medications - injection INTERVENTIONS - Miscellaneous []  - 0 External ear exam []  - 0 Specimen Collection (cultures, biopsies, blood, body fluids, etc.) []  - 0 Specimen(s) / Culture(s) sent or taken to Lab for analysis Samantha, Pratt ([id number]) [id number] Page 3 of 9 X- 1 10 Patient Transfer (multiple staff / Nurse, adult / Similar devices) []  - 0 Simple Staple / Suture removal (25 or less) []  - 0 Complex Staple / Suture removal (26 or more) []  - 0 Hypo / Hyperglycemic Management (close monitor of Blood Glucose) []  - 0 Ankle / Brachial Index (ABI) - do not check if billed separately X- 1 5 Vital Signs Has the patient been seen at the hospital within the last three years: Yes Total Score: 100 Level Of Care: New/Established - Level 3 Electronic Signature(s) Signed: 05/10/2023 2:28:05 PM By: Elliot Gurney, BSN, RN, CWS, Kim RN, BSN Entered By: Elliot Gurney, BSN, RN, CWS, Kim on 05/09/2023 15:45:33 -------------------------------------------------------------------------------- Encounter Discharge Information Details Patient Name: Date of Service: [name], [name]. 05/09/2023 7:45 A M Medical Record Number: [id number] Patient Account Number: [id number] Date of Birth/Sex: Treating RN: Apr 28, 1971 (52 y.o. Skip Mayer Primary Care : [name], [name] Other Clinician: Referring : Treating /Extender: [name] [place], [place] [name] in Treatment: 7 Encounter  Discharge Information Items Discharge Condition: Stable Ambulatory Status: Wheelchair Discharge Destination: Home Transportation: Private Auto Accompanied By: caregiver Schedule Follow-up Appointment: Yes Clinical Summary of Care: Electronic Signature(s) Signed: 05/09/2023 3:50:57 PM By: Elliot Gurney, BSN, RN, CWS, Kim RN, BSN Entered By: Elliot Gurney, BSN, RN, CWS, Kim on 05/09/2023 15:50:57 -------------------------------------------------------------------------------- Lower Extremity Assessment Details Patient Name: Date of Service: [name], [name]. 05/09/2023 7:45 A M Medical Record Number: [id number] Patient Account Number: [id number] Date of Birth/Sex: Treating RN: 01/29/1971 (52 y.o. Skip Mayer Primary Care : [name], [name] Other Clinician: OTHELLA, KANGAS ([id number]) [id number] Page 4 of 9 Referring : Treating /Extender: [name] [place], [place] [name] in Treatment: 7 Electronic Signature(s) Signed: 05/10/2023 2:28:05 PM By: Elliot Gurney, BSN, RN, CWS, Kim RN, BSN Entered By: Elliot Gurney, BSN, RN, CWS, Kim on 05/09/2023 08:09:32 -------------------------------------------------------------------------------- Multi Wound Chart Details Patient Name: Date of Service: [name], [name]. 05/09/2023 7:45 A M Medical Record Number: [id number] Patient Account Number: [id number] Date of Birth/Sex: Treating RN: 09-15-1971 (52 y.o. Skip Mayer Primary Care : [name], [name] Other Clinician: Referring : Treating /Extender: [name] [place], [place] [name] in Treatment: 7 Vital Signs Height(in): 64 Pulse(bpm): 99 Weight(lbs): 166 Blood Pressure(mmHg): 131/78 Body Mass Index(BMI): 28.5 Temperature(F): 98.3 Respiratory Rate(breaths/min): 18 [1:Photos:] [N/A:N/A] Left Gluteus N/A N/A Wound Location: Blister N/A N/A Wounding Event: Pressure Ulcer N/A N/A Primary Etiology: Neuropathy N/A N/A Comorbid  History: 03/07/2023 N/A N/A Date Acquired: 7 N/A N/A Weeks of Treatment: Open N/A N/A Wound Status: No N/A N/A Wound Recurrence: 3x0.5x2.8 N/A N/A Measurements Pratt x W x D (cm) 1.178 N/A N/A A (cm) : rea 3.299 N/A N/A Volume (cm) : 16.70% N/A N/A % Reduction in A rea: -11.10% N/A N/A % Reduction in Volume: 9 Starting Position  1 (o'clock): 3 Ending Position 1 (o'clock): 4.5 Maximum Distance 1 (cm): Yes N/A N/A Undermining: Category/Stage IV N/A N/A Classification: Medium N/A N/A Exudate A mount: Serosanguineous N/A N/A Exudate Type: red, brown N/A N/A Exudate Color: Large (67-100%) N/A N/A Granulation A mount: Red N/A N/A Granulation Quality: Small (1-33%) N/A N/A Necrotic A mount: Fat Layer (Subcutaneous Tissue): Yes N/A N/A Exposed Structures: Fascia: No Tendon: No Muscle: No Joint: No [name], [name] ([id number]) [phone].pdf Page 5 of 9 Bone: No None N/A N/A Epithelialization: Treatment Notes Electronic Signature(s) Signed: 05/09/2023 3:42:15 PM By: Elliot Gurney, BSN, RN, CWS, Kim RN, BSN Entered By: Elliot Gurney, BSN, RN, CWS, Kim on 05/09/2023 15:42:15 -------------------------------------------------------------------------------- Multi-Disciplinary Care Plan Details Patient Name: Date of Service: [name], [name]. 05/09/2023 7:45 A M Medical Record Number: [id number] Patient Account Number: [id number] Date of Birth/Sex: Treating RN: [birth year] (52 y.o. Skip Mayer Primary Care : [name], [name] Other Clinician: Referring : Treating /Extender: [name] [place], [place] [name] in Treatment: 7 Active Inactive Pressure Nursing Diagnoses: Knowledge deficit related to causes and risk factors for pressure ulcer development Knowledge deficit related to management of pressures ulcers Potential for impaired tissue integrity related to pressure, friction, moisture, and shear Goals: Patient will remain free  from development of additional pressure ulcers Date Initiated: 03/21/2023 Target Resolution Date: 04/20/2023 Goal Status: Active Patient/caregiver will verbalize understanding of pressure ulcer management Date Initiated: 03/21/2023 Date Inactivated: 05/09/2023 Target Resolution Date: 04/20/2023 Goal Status: Met Interventions: Assess: immobility, friction, shearing, incontinence upon admission and as needed Assess offloading mechanisms upon admission and as needed Assess potential for pressure ulcer upon admission and as needed Provide education on pressure ulcers Treatment Activities: Patient referred for home evaluation of offloading devices/mattresses : 03/21/2023 Notes: Soft Tissue Infection Nursing Diagnoses: Impaired tissue integrity Knowledge deficit related to disease process and management Knowledge deficit related to home infection control: handwashing, handling of soiled dressings, supply storage Potential for infection: soft tissue Goals: Patient will remain free of wound infection Date Initiated: 03/21/2023 Target Resolution Date: 05/21/2023 Goal Status: Active Patient/caregiver will verbalize understanding of or measures to prevent infection and contamination in the home setting ANISTEN, GULYAS ([id number]) [id number] Page 6 of 9 Date Initiated: 03/21/2023 Date Inactivated: 03/28/2023 Target Resolution Date: 03/21/2023 Goal Status: Met Patient's soft tissue infection will resolve Date Initiated: 03/21/2023 Date Inactivated: 04/11/2023 Target Resolution Date: 03/31/2023 Goal Status: Met Signs and symptoms of infection will be recognized early to allow for prompt treatment Date Initiated: 03/21/2023 Date Inactivated: 03/28/2023 Target Resolution Date: 03/21/2023 Goal Status: Met Interventions: Assess signs and symptoms of infection every visit Provide education on infection Treatment Activities: Systemic antibiotics : 03/21/2023 Notes: Wound/Skin  Impairment Nursing Diagnoses: Impaired tissue integrity Knowledge deficit related to smoking impact on wound healing Knowledge deficit related to ulceration/compromised skin integrity Goals: Patient/caregiver will verbalize understanding of skin care regimen Date Initiated: 03/21/2023 Target Resolution Date: 05/21/2023 Goal Status: Active Ulcer/skin breakdown will have a volume reduction of 30% by week 4 Date Initiated: 03/21/2023 Date Inactivated: 04/18/2023 Target Resolution Date: 04/18/2023 Goal Status: Unmet Unmet Reason: comorbidities Ulcer/skin breakdown will have a volume reduction of 50% by week 8 Date Initiated: 03/21/2023 Target Resolution Date: 05/16/2023 Goal Status: Active Ulcer/skin breakdown will have a volume reduction of 80% by week 12 Date Initiated: 03/21/2023 Target Resolution Date: 06/13/2023 Goal Status: Active Ulcer/skin breakdown will heal within 14 weeks Date Initiated: 03/21/2023 Target Resolution Date: 06/27/2023 Goal Status: Active Interventions: Assess patient/caregiver ability to obtain necessary supplies  Assess patient/caregiver ability to perform ulcer/skin care regimen upon admission and as needed Assess ulceration(s) every visit Treatment Activities: Patient referred to home care : 03/21/2023 Skin care regimen initiated : 03/21/2023 Notes: Electronic Signature(s) Signed: 05/09/2023 3:46:41 PM By: Elliot Gurney, BSN, RN, CWS, Kim RN, BSN Entered By: Elliot Gurney, BSN, RN, CWS, Kim on 05/09/2023 15:46:40 -------------------------------------------------------------------------------- Pain Assessment Details Patient Name: Date of Service: [name], [name]. 05/09/2023 7:45 A M Medical Record Number: [id number] Patient Account Number: [id number] Date of Birth/Sex: Treating RN: 05-23-1971 (52 y.o. [address], [address] [place], [place] ([phone].pdf Page 7 of 9 Primary Care : [name], [name] Other Clinician: Referring  : Treating /Extender: [name] [place], [place] [name] in Treatment: 7 Active Problems Location of Pain Severity and Description of Pain Patient Has Paino No Site Locations Pain Management and Medication Current Pain Management: Electronic Signature(s) Signed: 05/10/2023 2:28:05 PM By: Elliot Gurney, BSN, RN, CWS, Kim RN, BSN Entered By: Elliot Gurney, BSN, RN, CWS, Kim on 05/09/2023 08:07:46 -------------------------------------------------------------------------------- Patient/Caregiver Education Details Patient Name: Date of Service: [name], [name] 8/13/2024andnbsp7:45 A M Medical Record Number: [id number] Patient Account Number: [id number] Date of Birth/Gender: Treating RN: 1971-02-04 (52 y.o. Skip Mayer Primary Care Physician: [name], [name] Other Clinician: Referring Physician: Treating Physician/Extender: [name] [place], [place] [name] in Treatment: 7 Education Assessment Education Provided To: Patient Education Topics Provided Pressure: Handouts: Other: Limit time in your wheel chair. Keep pressure off of area Methods: Explain/Verbal Responses: State content correctly Wound/Skin Impairment: Handouts: Caring for Your Ulcer, Other: continue wound care as prescribed. Methods: Demonstration, Explain/Verbal Responses: State content correctly [name], [name] ([id number]) [id number] Page 8 of 9 Electronic Signature(s) Signed: 05/10/2023 2:28:05 PM By: Elliot Gurney, BSN, RN, CWS, Kim RN, BSN Entered By: Elliot Gurney, BSN, RN, CWS, Kim on 05/09/2023 15:48:06 -------------------------------------------------------------------------------- Wound Assessment Details Patient Name: Date of Service: [name], [name]. 05/09/2023 7:45 A M Medical Record Number: [id number] Patient Account Number: [id number] Date of Birth/Sex: Treating RN: 1971-07-20 (52 y.o. Skip Mayer Primary Care : [name], [name] Other Clinician: Referring  : Treating /Extender: [name] [place], [place] [name] in Treatment: 7 Wound Status Wound Number: 1 Primary Etiology: Pressure Ulcer Wound Location: Left Gluteus Wound Status: Open Wounding Event: Blister Comorbid History: Neuropathy Date Acquired: 03/07/2023 Weeks Of Treatment: 7 Clustered Wound: No Photos Wound Measurements Length: (cm) 3 Width: (cm) 0.5 Depth: (cm) 2.8 Area: (cm) 1.178 Volume: (cm) 3.299 % Reduction in Area: 16.7% % Reduction in Volume: -11.1% Epithelialization: None Tunneling: No Undermining: Yes Starting Position (o'clock): 9 Ending Position (o'clock): 3 Maximum Distance: (cm) 4.5 Wound Description Classification: Category/Stage IV Exudate Amount: Medium Exudate Type: Serosanguineous Exudate Color: red, brown Foul Odor After Cleansing: No Slough/Fibrino Yes Wound Bed Granulation Amount: Large (67-100%) Exposed Structure Granulation Quality: Red Fascia Exposed: No Necrotic Amount: Small (1-33%) Fat Layer (Subcutaneous Tissue) Exposed: Yes Necrotic Quality: Adherent Slough Tendon Exposed: No Muscle Exposed: No KYNSLEY, TARVIN Pratt ([id number]) [id number] Page 9 of 9 Joint Exposed: No Bone Exposed: No Treatment Notes Wound #1 (Gluteus) Wound Laterality: Left Cleanser Soap and Water Discharge Instruction: Gently cleanse wound with antibacterial soap, rinse and pat dry prior to dressing wounds Peri-Wound Care Topical Primary Dressing Hydrofera Blue Ready Transfer Foam, 2.5x2.5 (in/in) Discharge Instruction: Pack undermining 9-3 lightly Secondary Dressing ABD Pad 5x9 (in/in) Discharge Instruction: Cover with ABD pad Secured With Medipore T - 33M Medipore H Soft Cloth Surgical T ape ape, 2x2 (in/yd) Compression  Wrap Compression Stockings Add-Ons Electronic Signature(s) Signed: 05/09/2023 8:29:29 AM By: [name] PA-C Signed: 05/10/2023 2:28:05 PM By: Elliot Gurney, BSN, RN, CWS, Kim RN, BSN Entered  By: [name] on 05/09/2023 40:98:11 -------------------------------------------------------------------------------- Vitals Details Patient Name: Date of Service: Samantha [name] Pratt, Francella Pratt. 05/09/2023 7:45 A M Medical Record Number: [id number] Patient Account Number: [id number] Date of Birth/Sex: Treating RN: 23-Aug-1971 (52 y.o. Skip Mayer Primary Care : [name], [name] Other Clinician: Referring : Treating /Extender: [name] [place], [place] [name] in Treatment: 7 Vital Signs Time Taken: 08:06 Temperature (F): 98.3 Height (in): 64 Pulse (bpm): 99 Weight (lbs): 166 Respiratory Rate (breaths/min): 18 Body Mass Index (BMI): 28.5 Blood Pressure (mmHg): 131/78 Reference Range: 80 - 120 mg / dl Electronic Signature(s) Signed: 05/10/2023 2:28:05 PM By: Elliot Gurney, BSN, RN, CWS, Kim RN, BSN Entered By: Elliot Gurney, BSN, RN, CWS, Kim on 05/09/2023 08:07:40

## 2023-05-11 DIAGNOSIS — Z09 Encounter for follow-up examination after completed treatment for conditions other than malignant neoplasm: Principal | ICD-10-CM

## 2023-05-16 ENCOUNTER — Encounter: Payer: 59 | Admitting: Physician Assistant

## 2023-05-16 DIAGNOSIS — L89324 Pressure ulcer of left buttock, stage 4: Secondary | ICD-10-CM | POA: Diagnosis not present

## 2023-05-16 NOTE — Progress Notes (Addendum)
ZIQI, REIMER ([id number]) [id number] Page 1 of 9 Visit Report for 05/16/2023 Arrival Information Details Patient Name: Date of Service: [name], [name] 05/16/2023 1:30 PM Medical Record Number: [id number] Patient Account Number: [id number] Date of Birth/Sex: Treating RN: [birth year] (52 y.o. [name] Primary Care Seith Aikey: [name], [name] Other Clinician: Referring Emalina Dubreuil: Treating Shila Kruczek/Extender: [name] [place] [name] in Treatment: 8 Visit Information History Since Last Visit Added or deleted any medications: No Patient Arrived: Wheel Chair Any new allergies or adverse reactions: No Arrival Time: 13:40 Had a fall or experienced change in No Accompanied By: caregiver activities of daily living that may affect Transfer Assistance: None risk of falls: Patient Identification Verified: Yes Signs or symptoms of abuse/neglect since last visito No Secondary Verification Process Completed: Yes Hospitalized since last visit: No Patient Requires Transmission-Based Precautions: No Implantable device outside of the clinic excluding No Patient Has Alerts: Yes cellular tissue based products placed in the center Patient Alerts: NOT DIABETIC since last visit: Has Dressing in Place as Prescribed: Yes Pain Present Now: No Electronic Signature(s) Signed: 05/16/2023 1:41:14 PM By: Yevonne Pax RN Entered By: Yevonne Pax on 05/16/2023 10:41:14 -------------------------------------------------------------------------------- Clinic Level of Care Assessment Details Patient Name: Date of Service: Samantha, Pratt 05/16/2023 1:30 PM Medical Record Number: [id number] Patient Account Number: [id number] Date of Birth/Sex: Treating RN: 1970/10/09 (52 y.o. [name] Primary Care Sterling Mondo: Bertram Denver Other Clinician: Referring Alynah Schone: Treating Yisroel Mullendore/Extender: [name] [place] [name] in Treatment: 8 Clinic  Level of Care Assessment Items TOOL 4 Quantity Score X- 1 0 Use when only an EandM is performed on FOLLOW-UP visit ASSESSMENTS - Nursing Assessment / Reassessment X- 1 10 Reassessment of Co-morbidities (includes updates in patient status) X- 1 5 Reassessment of Adherence to Treatment Plan [name], [name] ([id number]) [id number] Page 2 of 9 ASSESSMENTS - Wound and Skin A ssessment / Reassessment X - Simple Wound Assessment / Reassessment - one wound 1 5 []  - 0 Complex Wound Assessment / Reassessment - multiple wounds []  - 0 Dermatologic / Skin Assessment (not related to wound area) ASSESSMENTS - Focused Assessment []  - 0 Circumferential Edema Measurements - multi extremities []  - 0 Nutritional Assessment / Counseling / Intervention []  - 0 Lower Extremity Assessment (monofilament, tuning fork, pulses) []  - 0 Peripheral Arterial Disease Assessment (using hand held doppler) ASSESSMENTS - Ostomy and/or Continence Assessment and Care []  - 0 Incontinence Assessment and Management []  - 0 Ostomy Care Assessment and Management (repouching, etc.) PROCESS - Coordination of Care X - Simple Patient / Family Education for ongoing care 1 15 []  - 0 Complex (extensive) Patient / Family Education for ongoing care []  - 0 Staff obtains Chiropractor, Records, T Results / Process Orders est []  - 0 Staff telephones HHA, Nursing Homes / Clarify orders / etc []  - 0 Routine Transfer to another Facility (non-emergent condition) []  - 0 Routine Hospital Admission (non-emergent condition) []  - 0 New Admissions / Manufacturing engineer / Ordering NPWT Apligraf, etc. , []  - 0 Emergency Hospital Admission (emergent condition) X- 1 10 Simple Discharge Coordination []  - 0 Complex (extensive) Discharge Coordination PROCESS - Special Needs []  - 0 Pediatric / Minor Patient Management []  - 0 Isolation Patient Management []  - 0 Hearing / Language / Visual special needs []   - 0 Assessment of Community assistance (transportation, D/C planning, etc.) []  - 0 Additional assistance / Altered mentation []  - 0 Support Surface(s) Assessment (bed,  cushion, seat, etc.) INTERVENTIONS - Wound Cleansing / Measurement X - Simple Wound Cleansing - one wound 1 5 []  - 0 Complex Wound Cleansing - multiple wounds X- 1 5 Wound Imaging (photographs - any number of wounds) []  - 0 Wound Tracing (instead of photographs) X- 1 5 Simple Wound Measurement - one wound []  - 0 Complex Wound Measurement - multiple wounds INTERVENTIONS - Wound Dressings X - Small Wound Dressing one or multiple wounds 1 10 []  - 0 Medium Wound Dressing one or multiple wounds []  - 0 Large Wound Dressing one or multiple wounds []  - 0 Application of Medications - topical []  - 0 Application of Medications - injection INTERVENTIONS - Miscellaneous []  - 0 External ear exam [name], [name] ([id number]) [id number] Page 3 of 9 []  - 0 Specimen Collection (cultures, biopsies, blood, body fluids, etc.) []  - 0 Specimen(s) / Culture(s) sent or taken to Lab for analysis []  - 0 Patient Transfer (multiple staff / Michiel Sites Lift / Similar devices) []  - 0 Simple Staple / Suture removal (25 or less) []  - 0 Complex Staple / Suture removal (26 or more) []  - 0 Hypo / Hyperglycemic Management (close monitor of Blood Glucose) []  - 0 Ankle / Brachial Index (ABI) - do not check if billed separately X- 1 5 Vital Signs Has the patient been seen at the hospital within the last three years: Yes Total Score: 75 Level Of Care: New/Established - Level 2 Electronic Signature(s) Signed: 05/26/2023 11:59:03 AM By: Yevonne Pax RN Entered By: Yevonne Pax on 05/16/2023 12:09:30 -------------------------------------------------------------------------------- Encounter Discharge Information Details Patient Name: Date of Service: [name], [name] 05/16/2023 1:30 PM Medical Record Number:  [id number] Patient Account Number: [id number] Date of Birth/Sex: Treating RN: 10-22-1970 (52 y.o. [name] Primary Care Breylan Lefevers: [name], [name] Other Clinician: Referring Tashari Schoenfelder: Treating Jozette Castrellon/Extender: [name] [place] [name] in Treatment: 8 Encounter Discharge Information Items Discharge Condition: Stable Ambulatory Status: Wheelchair Discharge Destination: Home Transportation: Private Auto Accompanied By: caregiver Schedule Follow-up Appointment: Yes Clinical Summary of Care: Electronic Signature(s) Signed: 05/16/2023 3:05:07 PM By: Yevonne Pax RN Entered By: Yevonne Pax on 05/16/2023 12:05:06 -------------------------------------------------------------------------------- Lower Extremity Assessment Details Patient Name: Date of Service: [name], Samantha DEBOE. 05/16/2023 1:30 PM SKYLAR, MAROLDA L ([id number]) [id number] Page 4 of 9 Medical Record Number: [id number] Patient Account Number: [id number] Date of Birth/Sex: Treating RN: 21-Sep-1971 (52 y.o. [name] Primary Care Braniya Farrugia: Bertram Denver Other Clinician: Referring Keion Neels: Treating Averey Koning/Extender: [name] [place] [name] in Treatment: 8 Electronic Signature(s) Signed: 05/16/2023 1:42:58 PM By: Yevonne Pax RN Entered By: Yevonne Pax on 05/16/2023 10:42:58 -------------------------------------------------------------------------------- Multi Wound Chart Details Patient Name: Date of Service: [name], [name] 05/16/2023 1:30 PM Medical Record Number: [id number] Patient Account Number: [id number] Date of Birth/Sex: Treating RN: [birth year] (52 y.o. [name] Primary Care Bambi Fehnel: [name], [name] Other Clinician: Referring Akon Reinoso: Treating Yvonnia Tango/Extender: [name] [place] [name] in Treatment: 8 Vital Signs Height(in): 64 Pulse(bpm): 80 Weight(lbs): 166 Blood Pressure(mmHg): 114/77 Body Mass Index(BMI):  28.5 Temperature(F): 98.1 Respiratory Rate(breaths/min): 18 [1:Photos:] [N/A:N/A] Left Gluteus N/A N/A Wound Location: Blister N/A N/A Wounding Event: Pressure Ulcer N/A N/A Primary Etiology: Neuropathy N/A N/A Comorbid History: 03/07/2023 N/A N/A Date Acquired: 8 N/A N/A Weeks of Treatment: Open N/A N/A Wound Status: No N/A N/A Wound Recurrence: 1.6x0.4x3.5 N/A N/A Measurements L x W x D (cm) 0.503 N/A N/A A (cm) : rea  1.759 N/A N/A Volume (cm) : 64.40% N/A N/A % Reduction in A rea: 40.80% N/A N/A % Reduction in Volume: 9 Starting Position 1 (o'clock): 11 Ending Position 1 (o'clock): 5.5 Maximum Distance 1 (cm): Yes N/A N/A Undermining: Category/Stage IV N/A N/A Classification: Medium N/A N/A Exudate A mount: Serosanguineous N/A N/A Exudate Type: red, brown N/A N/A Exudate Color: Large (67-100%) N/A N/A Granulation A mount: Red N/A N/A Granulation Quality: None Present (0%) N/A N/A Necrotic A mount: Fat Layer (Subcutaneous Tissue): Yes N/A N/A Exposed Structures: Fascia: No MARCELE, AVILEZ ([id number]) [id number] Page 5 of 9 Tendon: No Muscle: No Joint: No Bone: No None N/A N/A Epithelialization: Treatment Notes Electronic Signature(s) Signed: 05/16/2023 1:43:03 PM By: Yevonne Pax RN Entered By: Yevonne Pax on 05/16/2023 10:43:03 -------------------------------------------------------------------------------- Multi-Disciplinary Care Plan Details Patient Name: Date of Service: [name], [name] 05/16/2023 1:30 PM Medical Record Number: [id number] Patient Account Number: [id number] Date of Birth/Sex: Treating RN: Feb 23, 1971 (52 y.o. [name] Primary Care Aldena Worm: [name], [name] Other Clinician: Referring Teofila Bowery: Treating Deniro Laymon/Extender: [name] [place] [name] in Treatment: 8 Active Inactive Soft Tissue Infection Nursing Diagnoses: Impaired tissue integrity Knowledge deficit  related to disease process and management Knowledge deficit related to home infection control: handwashing, handling of soiled dressings, supply storage Potential for infection: soft tissue Goals: Patient will remain free of wound infection Date Initiated: 03/21/2023 Target Resolution Date: 05/21/2023 Goal Status: Active Patient/caregiver will verbalize understanding of or measures to prevent infection and contamination in the home setting Date Initiated: 03/21/2023 Date Inactivated: 03/28/2023 Target Resolution Date: 03/21/2023 Goal Status: Met Patient's soft tissue infection will resolve Date Initiated: 03/21/2023 Date Inactivated: 04/11/2023 Target Resolution Date: 03/31/2023 Goal Status: Met Signs and symptoms of infection will be recognized early to allow for prompt treatment Date Initiated: 03/21/2023 Date Inactivated: 03/28/2023 Target Resolution Date: 03/21/2023 Goal Status: Met Interventions: Assess signs and symptoms of infection every visit Provide education on infection Treatment Activities: Systemic antibiotics : 03/21/2023 Notes: Wound/Skin Impairment Nursing Diagnoses: Impaired tissue integrity Knowledge deficit related to smoking impact on wound healing GARNER, ZEPPIERI ([id number]) [id number] Page 6 of 9 Knowledge deficit related to ulceration/compromised skin integrity Goals: Patient/caregiver will verbalize understanding of skin care regimen Date Initiated: 03/21/2023 Target Resolution Date: 05/21/2023 Goal Status: Active Ulcer/skin breakdown will have a volume reduction of 30% by week 4 Date Initiated: 03/21/2023 Date Inactivated: 04/18/2023 Target Resolution Date: 04/18/2023 Goal Status: Unmet Unmet Reason: comorbidities Ulcer/skin breakdown will have a volume reduction of 50% by week 8 Date Initiated: 03/21/2023 Date Inactivated: 05/16/2023 Target Resolution Date: 05/16/2023 Goal Status: Unmet Unmet Reason: comorbidities Ulcer/skin  breakdown will have a volume reduction of 80% by week 12 Date Initiated: 03/21/2023 Target Resolution Date: 06/13/2023 Goal Status: Active Ulcer/skin breakdown will heal within 14 weeks Date Initiated: 03/21/2023 Target Resolution Date: 06/27/2023 Goal Status: Active Interventions: Assess patient/caregiver ability to obtain necessary supplies Assess patient/caregiver ability to perform ulcer/skin care regimen upon admission and as needed Assess ulceration(s) every visit Treatment Activities: Patient referred to home care : 03/21/2023 Skin care regimen initiated : 03/21/2023 Notes: Electronic Signature(s) Signed: 05/16/2023 1:44:37 PM By: Yevonne Pax RN Entered By: Yevonne Pax on 05/16/2023 10:44:37 -------------------------------------------------------------------------------- Pain Assessment Details Patient Name: Date of Service: [name], [name] 05/16/2023 1:30 PM Medical Record Number: [id number] Patient Account Number: [id number] Date of Birth/Sex: Treating RN: July 04, 1971 (52 y.o. [name] Primary Care Elizabelle Fite: [name], [name] Other Clinician: Referring Mekhi Sonn: Treating Angelea Penny/Extender: [name]  [place], [place] UREN Weeks in Treatment: 8 Active Problems Location of Pain Severity and Description of Pain Patient Has Paino No Site Locations [place], [place] L ([id number]) [id number] Page 7 of 9 Pain Management and Medication Current Pain Management: Electronic Signature(s) Signed: 05/26/2023 11:59:03 AM By: Yevonne Pax RN Entered By: Yevonne Pax on 05/16/2023 10:41:44 -------------------------------------------------------------------------------- Patient/Caregiver Education Details Patient Name: Date of Service: Samantha Lucius Conn 8/20/2024andnbsp1:30 PM Medical Record Number: [id number] Patient Account Number: [id number] Date of Birth/Gender: Treating RN: 14-May-1971 (52 y.o. [name] Primary Care Physician: Bertram Denver Other Clinician: Referring Physician: Treating Physician/Extender: [name] [place], [place] [name] in Treatment: 8 Education Assessment Education Provided To: Patient Education Topics Provided Pressure: Handouts: Pressure Injury: Prevention and Offloading Methods: Explain/Verbal Responses: State content correctly Electronic Signature(s) Signed: 05/26/2023 11:59:03 AM By: Yevonne Pax RN Entered By: Yevonne Pax on 05/16/2023 10:44:53 [name] ([id number]) [id number] Page 8 of 9 -------------------------------------------------------------------------------- Wound Assessment Details Patient Name: Date of Service: [name], [name] 05/16/2023 1:30 PM Medical Record Number: [id number] Patient Account Number: [id number] Date of Birth/Sex: Treating RN: [birth year] (52 y.o. [name] Primary Care Ihor Meinzer: [name], [name] Other Clinician: Referring Kaire Stary: Treating Kellon Chalk/Extender: [name] [place] [name] in Treatment: 8 Wound Status Wound Number: 1 Primary Etiology: Pressure Ulcer Wound Location: Left Gluteus Wound Status: Open Wounding Event: Blister Comorbid History: Neuropathy Date Acquired: 03/07/2023 Weeks Of Treatment: 8 Clustered Wound: No Photos Wound Measurements Length: (cm) 1.6 Width: (cm) 0.4 Depth: (cm) 3.5 Area: (cm) 0.50 Volume: (cm) 1.75 % Reduction in Area: 64.4% % Reduction in Volume: 40.8% Epithelialization: None 3 Tunneling: No 9 Undermining: Yes Starting Position (o'clock): 9 Ending Position (o'clock): 11 Maximum Distance: (cm) 5.5 Wound Description Classification: Category/Stage IV Exudate Amount: Medium Exudate Type: Serosanguineous Exudate Color: red, brown Foul Odor After Cleansing: No Slough/Fibrino No Wound Bed Granulation Amount: Large (67-100%) Exposed Structure Granulation Quality: Red Fascia Exposed: No Necrotic Amount: None Present (0%) Fat Layer  (Subcutaneous Tissue) Exposed: Yes Tendon Exposed: No Muscle Exposed: No Joint Exposed: No Bone Exposed: No Treatment Notes Wound #1 (Gluteus) Wound Laterality: Left Cleanser ELIOTTE, MIDDENDORF L ([id number]) [id number] Page 9 of 9 Soap and Water Discharge Instruction: Gently cleanse wound with antibacterial soap, rinse and pat dry prior to dressing wounds Peri-Wound Care Topical Primary Dressing Prisma 4.34 (in) Discharge Instruction: Moisten w/normal saline or sterile water; Cover wound as directed. Do not remove from wound bed. Secondary Dressing ABD Pad 5x9 (in/in) Discharge Instruction: Cover bolster with ABD pad Gauze Discharge Instruction: bolster to fill opening of wound to prevent wound closure until wound has filled with granulation tissue Secured With Medipore T - 39M Medipore H Soft Cloth Surgical T ape ape, 2x2 (in/yd) Compression Wrap Compression Stockings Add-Ons Electronic Signature(s) Signed: 05/16/2023 1:42:47 PM By: Yevonne Pax RN Entered By: Yevonne Pax on 05/16/2023 10:42:47 -------------------------------------------------------------------------------- Vitals Details Patient Name: Date of Service: [name], Nhi L. 05/16/2023 1:30 PM Medical Record Number: [id number] Patient Account Number: [id number] Date of Birth/Sex: Treating RN: 02/07/1971 (52 y.o. [name] Primary Care Dusan Lipford: [name], [name] Other Clinician: Referring Valgene Deloatch: Treating Meliza Kage/Extender: [name] [place] [name] in Treatment: 8 Vital Signs Time Taken: 13:33 Temperature (F): 98.1 Height (in): 64 Pulse (bpm): 80 Weight (lbs): 166 Respiratory Rate (breaths/min): 18 Body Mass Index (BMI): 28.5 Blood Pressure (mmHg): 114/77 Reference Range: 80 - 120 mg / dl Electronic Signature(s) Signed: 05/16/2023 1:41:36 PM By:  Yevonne Pax RN Entered By: Yevonne Pax on 05/16/2023 10:41:36

## 2023-05-17 ENCOUNTER — Ambulatory Visit: Admit: 2023-05-17 | Discharge: 2023-05-18 | Payer: MEDICARE

## 2023-05-17 DIAGNOSIS — Z09 Encounter for follow-up examination after completed treatment for conditions other than malignant neoplasm: Principal | ICD-10-CM

## 2023-05-17 DIAGNOSIS — L0231 Cutaneous abscess of buttock: Principal | ICD-10-CM

## 2023-05-17 DIAGNOSIS — G8220 Paraplegia, unspecified: Principal | ICD-10-CM

## 2023-05-17 DIAGNOSIS — L89323 Pressure ulcer of left buttock, stage 3: Principal | ICD-10-CM

## 2023-05-18 ENCOUNTER — Ambulatory Visit: Admit: 2023-05-18 | Discharge: 2023-05-24 | Disposition: A | Discharge status: Home Health | Payer: MEDICARE

## 2023-05-23 ENCOUNTER — Ambulatory Visit: Payer: 59 | Admitting: Internal Medicine

## 2023-05-24 MED ORDER — PANTOPRAZOLE 40 MG TABLET,DELAYED RELEASE
ORAL_TABLET | Freq: Every day | ORAL | 0 refills | 30 days | Status: CP
Start: 2023-05-24 — End: 2023-06-23

## 2023-05-25 DIAGNOSIS — Z09 Encounter for follow-up examination after completed treatment for conditions other than malignant neoplasm: Principal | ICD-10-CM

## 2023-05-25 DIAGNOSIS — Z789 Other specified health status: Principal | ICD-10-CM

## 2023-05-25 DIAGNOSIS — E669 Obesity, unspecified: Principal | ICD-10-CM

## 2023-05-25 DIAGNOSIS — G35 Multiple sclerosis: Principal | ICD-10-CM

## 2023-05-25 DIAGNOSIS — G8220 Paraplegia, unspecified: Principal | ICD-10-CM

## 2023-05-25 DIAGNOSIS — M86152 Other acute osteomyelitis, left femur: Principal | ICD-10-CM

## 2023-05-26 NOTE — Progress Notes (Signed)
[name], WOTRING ([id number]) [id number] Page 1 of 7 Visit Report for 05/16/2023 Chief Complaint Document Details Patient Name: Date of Service: [name], Samantha 05/16/2023 1:30 PM Medical Record Number: [id number] Patient Account Number: [id number] Date of Birth/Sex: Treating RN: [birth year] (52 y.o. [name] Primary Care Provider: [name], [name] Other Clinician: Referring Provider: Treating Provider/Extender: [name] GO LDBECK, [place] [name] in Treatment: 8 Information Obtained from: Patient Chief Complaint Pressure ulcer left gluteal region Electronic Signature(s) Signed: 05/16/2023 1:46:20 PM By: [name] PA-C Entered By: [name] on 05/16/2023 10:46:20 -------------------------------------------------------------------------------- HPI Details Patient Name: Date of Service: Samantha O Pratt, [name] 05/16/2023 1:30 PM Medical Record Number: [id number] Patient Account Number: [id number] Date of Birth/Sex: Treating RN: 1970-12-11 (52 y.o. [name] Primary Care Provider: [name], [name] Other Clinician: Referring Provider: Treating Provider/Extender: [name] GO LDBECK, [place] [name] in Treatment: 8 History of Present Illness HPI Description: 03-21-2023 upon evaluation today patient appears for initial evaluation regarding a wound that actually began around 03/07/2023. With that being said she has been having issues here with this area and then subsequently ended up in the hospital due to a high fever. She was discharged from the hospital and then started on oral antibiotics that was on 03-16-2023. The oral antibiotics are doxycycline and Keflex that she is currently taking. With that being said she was diagnosed with multiple sclerosis in 2000 [name] she has been in a well motorized wheelchair since 2014. She is a pleasant individual and unfortunately this is the issue that she is having currently with a pressure ulcer although  this is the good news is it is the first time she has had this happen she has an excellent caregiver that takes great care of her and the family is very attentive. 03-28-2023 upon evaluation today patient appears to be doing worse with regard to the irritation around the edges of her wound. The good news is the wound itself does not appear worse but the irritation is definitely not as good as even what I saw last week. In the interim since last week [name] her aide has actually broken her wrist and is no longer helping with the dressing changes I think this is part of the issue here. Fortunately I do not see any evidence of infection locally or systemically which is good news. 04-04-2023 upon evaluation today patient's wounds are actually showing signs of significant improvement which is great news. Fortunately I do not see any evidence of worsening overall I think that her family is making excellent progress towards getting this closed she is doing a good job offloading we will try to get the air-fluidized bed but that is just not working out her insurance at this point is denying this. She does have an alternating air mattress at home although it has been apparently on static mode I did advise this to be switched to alternating mode. Samantha, Pratt ([id number]) [id number] Page 2 of 7 04-11-2023 upon evaluation today patient appears to be doing better in regard to her wound. She has been tolerating the dressing changes without complication. Fortunately I do not see any signs of active infection locally or systemically at this time. Her wound does have a lot of blue-green drainage which has made concerned about Pseudomonas hanging out of the area. 7/23; improved wound which is a pressure ulcer on the left buttock in a patient with advanced MS. She has a surface on her mattress at  home as well as a Roho cushion for her wheelchair. We have been using gentamicin and Hydrofera  Blue rope. Depth is slightly better today 05-09-2023 upon evaluation today patient presents for initial inspection in our clinic following her hospital admission she was in the hospital for just a little over a week having just gotten out on Friday of this past week. Fortunately she seems to be doing a lot better although it is suspected that she may have possible early osteomyelitis though it was not definitive on CT scan. This CT scan was performed on 04-24-2023. She is on antibiotics at this point for that. She also did have an abscess that was cleaned out to the wound is deeper at this time. Fortunately I do not see any evidence of worsening overall which is great news. The wound is deeper but again I think it was already deep and this way it was just hidden underneath and we had not accessed the full depth of the wound previous. The patient's hospital admission was actually from 04-24-2023 through 05-05-2023. It looks like that on discharge she was placed on antibiotics due to having Pseudomonas, Proteus, and E. coli at 1 point during the admission. She did also have COVID during the admission. It looks like she was discharged with Cipro 500 mg on 05-05-2023. 05-16-2023 upon evaluation today patient appears to be doing decently well in regard to her wound although the external opening is trying to close much faster than the internal is feeling in which is not what I want to see. Electronic Signature(s) Signed: 05/16/2023 6:11:52 PM By: [name] PA-C Entered By: [name] on 05/16/2023 15:11:52 -------------------------------------------------------------------------------- Physical Exam Details Patient Name: Date of Service: [name], [name]. 05/16/2023 1:30 PM Medical Record Number: [id number] Patient Account Number: [id number] Date of Birth/Sex: Treating RN: [birth year] (52 y.o. [name] Primary Care Provider: [name], [name] Other Clinician: Referring Provider: Treating  Provider/Extender: [name] GO [place], [place] [name] in Treatment: 8 Constitutional Well-nourished and well-hydrated in no acute distress. Respiratory normal breathing without difficulty. Psychiatric this patient is able to make decisions and demonstrates good insight into disease process. Alert and Oriented x 3. pleasant and cooperative. Notes Upon inspection patient's wound bed actually showed signs of good granulation and epithelization at this point. Fortunately I do not see any signs of active infection at this time which is great news and in general I do believe that we are making good headway towards healing although I think we need to switch her for dressing a little bit here. Electronic Signature(s) Signed: 05/16/2023 6:12:09 PM By: [name] PA-C Entered By: [name] on 05/16/2023 15:12:09 [name] ([id number]) [id number] Page 3 of 7 -------------------------------------------------------------------------------- Physician Orders Details Patient Name: Date of Service: KAHDIJAH, WARSTLER 05/16/2023 1:30 PM Medical Record Number: [id number] Patient Account Number: [id number] Date of Birth/Sex: Treating RN: August 12, 1971 (52 y.o. [name] Primary Care Provider: [name], [name] Other Clinician: Referring Provider: Treating Provider/Extender: [name] GO [place], [place] [name] in Treatment: 8 Verbal / Phone Orders: No Diagnosis Coding Follow-up Appointments Return Appointment in 1 week. Home Health Home Health Company: - [facility] fax: [phone] [facility] for wound care. May utilize formulary equivalent dressing for wound treatment orders unless otherwise specified. Home Health Nurse may visit PRN to address patients wound care needs. Scheduled days for dressing changes to be completed; exception, patient has scheduled wound care visit that day. - Monday, Wednesday, Friday **  Please direct any NON-WOUND  related issues/requests for orders to patient's Primary Care Physician. **If current dressing causes regression in wound condition, may D/C ordered dressing product/s and apply Normal Saline Moist Dressing daily until next Wound Healing Center or Other MD appointment. **Notify Wound Healing Center of regression in wound condition at [phone]. Bathing/ [employer] wounds with antibacterial soap and water. Off-Loading Roho cushion for wheelchair Hospital bed/mattress A fluidized (Group 3) - Home Health, please order for patient. ir Turn and reposition every 2 hours - Float heels on pillow when in the bed. Additional Orders / Instructions Follow Nutritious Diet and Increase Protein Intake Wound Treatment Wound #1 - Gluteus Wound Laterality: Left Cleanser: Soap and Water [facility]) Every Other Day/30 Days Discharge Instructions: Gently cleanse wound with antibacterial soap, rinse and pat dry prior to dressing wounds Prim Dressing: Prisma 4.34 (in) Every Other Day/30 Days ary Discharge Instructions: Moisten w/normal saline or sterile water; Cover wound as directed. Do not remove from wound bed. Secondary Dressing: ABD Pad 5x9 (in/in) Every Other Day/30 Days Discharge Instructions: Cover bolster with ABD pad Secondary Dressing: Gauze Every Other Day/30 Days Discharge Instructions: bolster to fill opening of wound to prevent wound closure until wound has filled with granulation tissue Secured With: Medipore T - 46M Medipore H Soft Cloth Surgical T ape ape, 2x2 (in/yd) Every Other Day/30 Days Electronic Signature(s) Signed: 05/16/2023 6:45:54 PM By: [name] PA-C Signed: 05/26/2023 11:59:03 AM By: Yevonne Pax RN Previous Signature: 05/16/2023 1:46:05 PM Version By: Yevonne Pax RN Entered By: Yevonne Pax on 05/16/2023 12:09:15 -------------------------------------------------------------------------------- Problem List Details Patient Name: Date of Service: Samantha O Pratt,  Venecia L. 05/16/2023 1:30 PM [name] ([id number]) [id number] Page 4 of 7 Medical Record Number: [id number] Patient Account Number: [id number] Date of Birth/Sex: Treating RN: 1971-09-11 (51 y.o. [name] Primary Care Provider: [name], [name] Other Clinician: Referring Provider: Treating Provider/Extender: [name] GO [place], [place] [name] in Treatment: 8 Active Problems ICD-10 Encounter Code Description Active Date MDM Diagnosis L89.324 Pressure ulcer of left buttock, stage 4 03/21/2023 No Yes G35 Multiple sclerosis 03/21/2023 No Yes Inactive Problems Resolved Problems Electronic Signature(s) Signed: 05/16/2023 1:46:16 PM By: [name] PA-C Entered By: [name] on 05/16/2023 10:46:15 -------------------------------------------------------------------------------- Progress Note Details Patient Name: Date of Service: Samantha O Pratt, Graceanne L. 05/16/2023 1:30 PM Medical Record Number: [id number] Patient Account Number: [id number] Date of Birth/Sex: Treating RN: Jun 13, 1971 (52 y.o. [name] Primary Care Provider: [name], [name] Other Clinician: Referring Provider: Treating Provider/Extender: [name] GO [place], [place] [name] in Treatment: 8 Subjective Chief Complaint Information obtained from Patient Pressure ulcer left gluteal region History of Present Illness (HPI) 03-21-2023 upon evaluation today patient appears for initial evaluation regarding a wound that actually began around 03/07/2023. With that being said she has been having issues here with this area and then subsequently ended up in the hospital due to a high fever. She was discharged from the hospital and then started on oral antibiotics that was on 03-16-2023. The oral antibiotics are doxycycline and Keflex that she is currently taking. With that being said she was diagnosed with multiple sclerosis in 2000 [name] she has been in a well motorized wheelchair since  2014. She is a pleasant individual and unfortunately this is the issue that she is having currently with a pressure ulcer although this is the good news is it is the first time she has had this happen she has an excellent caregiver  that takes great care of her and the family is very attentive. 03-28-2023 upon evaluation today patient appears to be doing worse with regard to the irritation around the edges of her wound. The good news is the wound itself does not appear worse but the irritation is definitely not as good as even what I saw last week. In the interim since last week [name] her aide has actually broken her wrist and is no longer helping with the dressing changes I think this is part of the issue here. Fortunately I do not see any evidence of infection locally or systemically which is good news. 04-04-2023 upon evaluation today patient's wounds are actually showing signs of significant improvement which is great news. Fortunately I do not see any evidence of worsening overall I think that her family is making excellent progress towards getting this closed she is doing a good job offloading we will try to get the air-fluidized bed but that is just not working out her insurance at this point is denying this. She does have an alternating air mattress at home although it has been apparently on static mode I did advise this to be switched to alternating mode. 04-11-2023 upon evaluation today patient appears to be doing better in regard to her wound. She has been tolerating the dressing changes without complication. Fortunately I do not see any signs of active infection locally or systemically at this time. Her wound does have a lot of blue-green drainage which has made RAYELLA, JUNGE ([id number]) [id number].pdf Page 5 of 7 concerned about Pseudomonas hanging out of the area. 7/23; improved wound which is a pressure ulcer on the left buttock in a patient with advanced MS.  She has a surface on her mattress at home as well as a Roho cushion for her wheelchair. We have been using gentamicin and Hydrofera Blue rope. Depth is slightly better today 05-09-2023 upon evaluation today patient presents for initial inspection in our clinic following her hospital admission she was in the hospital for just a little over a week having just gotten out on Friday of this past week. Fortunately she seems to be doing a lot better although it is suspected that she may have possible early osteomyelitis though it was not definitive on CT scan. This CT scan was performed on 04-24-2023. She is on antibiotics at this point for that. She also did have an abscess that was cleaned out to the wound is deeper at this time. Fortunately I do not see any evidence of worsening overall which is great news. The wound is deeper but again I think it was already deep and this way it was just hidden underneath and we had not accessed the full depth of the wound previous. The patient's hospital admission was actually from 04-24-2023 through 05-05-2023. It looks like that on discharge she was placed on antibiotics due to having Pseudomonas, Proteus, and E. coli at 1 point during the admission. She did also have COVID during the admission. It looks like she was discharged with Cipro 500 mg on 05-05-2023. 05-16-2023 upon evaluation today patient appears to be doing decently well in regard to her wound although the external opening is trying to close much faster than the internal is feeling in which is not what I want to see. Objective Constitutional Well-nourished and well-hydrated in no acute distress. Vitals Time Taken: 1:33 PM, Height: 64 in, Weight: 166 lbs, BMI: 28.5, Temperature: 98.1 F, Pulse: 80 bpm, Respiratory Rate: 18 breaths/min, Blood Pressure: 114/77  mmHg. Respiratory normal breathing without difficulty. Psychiatric this patient is able to make decisions and demonstrates good insight into disease  process. Alert and Oriented x 3. pleasant and cooperative. General Notes: Upon inspection patient's wound bed actually showed signs of good granulation and epithelization at this point. Fortunately I do not see any signs of active infection at this time which is great news and in general I do believe that we are making good headway towards healing although I think we need to switch her for dressing a little bit here. Integumentary (Hair, Skin) Wound #1 status is Open. Original cause of wound was Blister. The date acquired was: 03/07/2023. The wound has been in treatment 8 weeks. The wound is located on the Left Gluteus. The wound measures 1.6cm length x 0.4cm width x 3.5cm depth; 0.503cm^2 area and 1.759cm^3 volume. There is Fat Layer (Subcutaneous Tissue) exposed. There is no tunneling noted, however, there is undermining starting at 9:00 and ending at 11:00 with a maximum distance of 5.5cm. There is a medium amount of serosanguineous drainage noted. There is large (67-100%) red granulation within the wound bed. There is no necrotic tissue within the wound bed. Assessment Active Problems ICD-10 Pressure ulcer of left buttock, stage 4 Multiple sclerosis Plan Follow-up Appointments: Return Appointment in 1 week. Home Health: Home Health Company: - [facility] fax: [phone] [facility] for wound care. May utilize formulary equivalent dressing for wound treatment orders unless otherwise specified. Home Health Nurse may visit PRN to address patients wound care needs. Scheduled days for dressing changes to be completed; exception, patient has scheduled wound care visit that day. - Monday, Wednesday, Friday **Please direct any NON-WOUND related issues/requests for orders to patient's Primary Care Physician. **If current dressing causes regression in wound condition, may D/C ordered dressing product/s and apply Normal Saline Moist Dressing daily until next Wound Healing Center or Other MD  appointment. **Notify Wound Healing Center of regression in wound condition at [phone]. Bathing/ Shower/ Hygiene: Wash wounds with antibacterial soap and water. Off-Loading: Roho cushion for wheelchair Hospital bed/mattress Air fluidized (Group 3) - Home Health, please order for patient. Turn and reposition every 2 hours - Float heels on pillow when in the bed. Additional Orders / Instructions: LAFONDA, BAIRES ([id number]) [id number].pdf Page 6 of 7 Follow Nutritious Diet and Increase Protein Intake WOUND #1: - Gluteus Wound Laterality: Left Cleanser: Soap and Water [facility]) Every Other Day/30 Days Discharge Instructions: Gently cleanse wound with antibacterial soap, rinse and pat dry prior to dressing wounds Prim Dressing: Prisma 4.34 (in) Every Other Day/30 Days ary Discharge Instructions: Moisten w/normal saline or sterile water; Cover wound as directed. Do not remove from wound bed. Secondary Dressing: ABD Pad 5x9 (in/in) Every Other Day/30 Days Discharge Instructions: Cover bolster with ABD pad Secondary Dressing: Gauze Every Other Day/30 Days Discharge Instructions: bolster to fill opening of wound to prevent wound closure until wound has filled with granulation tissue Secured With: Medipore T - 53M Medipore H Soft Cloth Surgical T ape ape, 2x2 (in/yd) Every Other Day/30 Days 1. I do not think a wound VAC is necessarily good to be the right thing to do but I do believe using collagen in the base of the wound to try to help this heal is good to be good. We can do packed this with collagen as much as we can and then regular use a gauze plug in order to try to keep the external portion open. I think this is good to be  of utmost importance. 2. I am going to recommend as well that the patient should continue with the ABD pad secured with tape. 3. With regard to wound VAC my concern is that it may not actually do what needs to be done as far as keeping  the external opening where it needs to be regular tried a few something with gauze and a Tegaderm in order to try to keep this open a little bit better and the patient is in agreement with the plan. This is more or less a plugged to try to keep the external area open so that the internal can heal appropriately. We will see patient back for reevaluation in 1 week here in the clinic. If anything worsens or changes patient will contact our office for additional recommendations. Electronic Signature(s) Signed: 05/16/2023 6:13:10 PM By: [name] PA-C Entered By: [name] on 05/16/2023 15:13:10 -------------------------------------------------------------------------------- SuperBill Details Patient Name: Date of Service: Samantha O Pratt, MEILANI ROETHLISBERGER 05/16/2023 Medical Record Number: [id number] Patient Account Number: [id number] Date of Birth/Sex: Treating RN: [birth year] (52 y.o. 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

## 2023-05-30 ENCOUNTER — Encounter: Payer: 59 | Attending: Physician Assistant | Admitting: Physician Assistant

## 2023-05-30 DIAGNOSIS — L89324 Pressure ulcer of left buttock, stage 4: Secondary | ICD-10-CM | POA: Diagnosis present

## 2023-05-30 DIAGNOSIS — Z993 Dependence on wheelchair: Secondary | ICD-10-CM | POA: Diagnosis not present

## 2023-05-30 DIAGNOSIS — G35 Multiple sclerosis: Secondary | ICD-10-CM | POA: Insufficient documentation

## 2023-05-30 DIAGNOSIS — Z792 Long term (current) use of antibiotics: Secondary | ICD-10-CM | POA: Insufficient documentation

## 2023-05-30 NOTE — Progress Notes (Addendum)
DENISSA, Pratt ([id number]) [id number] Page 1 of 7 Visit Report for 05/30/2023 Chief Complaint Document Details Patient Name: Date of Service: [name], ROESE 05/30/2023 12:30 PM Medical Record Number: [id number] Patient Account Number: [id number] Date of Birth/Sex: Treating RN: [birth year] (52 y.o. [name] Primary Care Provider: [name], [name] Other Clinician: Referring Provider: Treating Provider/Extender: [name] GO LDBECK, [place] [name] in Treatment: 10 Information Obtained from: Patient Chief Complaint Pressure ulcer left gluteal region Electronic Signature(s) Signed: 05/30/2023 12:45:10 PM By: [name] PA-C Entered By: [name] on 05/30/2023 09:45:10 -------------------------------------------------------------------------------- HPI Details Patient Name: Date of Service: [name], [name] 05/30/2023 12:30 PM Medical Record Number: [id number] Patient Account Number: [id number] Date of Birth/Sex: Treating RN: 02/18/1971 (52 y.o. [name] Primary Care Provider: [name], [name] Other Clinician: Referring Provider: Treating Provider/Extender: [name] GO LDB[place]K, [place] [name] in Treatment: 10 History of Present Illness HPI Description: 03-21-2023 upon evaluation today patient appears for initial evaluation regarding a wound that actually began around 03/07/2023. With that being said she has been having issues here with this area and then subsequently ended up in the hospital due to a high fever. She was discharged from the hospital and then started on oral antibiotics that was on 03-16-2023. The oral antibiotics are doxycycline and Keflex that she is currently taking. With that being said she was diagnosed with multiple sclerosis in 2000 [name] she has been in a well motorized wheelchair since 2014. She is a pleasant individual and unfortunately this is the issue that she is having currently with a pressure ulcer although  this is the good news is it is the first time she has had this happen she has an excellent caregiver that takes great care of her and the family is very attentive. 03-28-2023 upon evaluation today patient appears to be doing worse with regard to the irritation around the edges of her wound. The good news is the wound itself does not appear worse but the irritation is definitely not as good as even what I saw last week. In the interim since last week [name] her aide has actually broken her wrist and is no longer helping with the dressing changes I think this is part of the issue here. Fortunately I do not see any evidence of infection locally or systemically which is good news. 04-04-2023 upon evaluation today patient's wounds are actually showing signs of significant improvement which is great news. Fortunately I do not see any evidence of worsening overall I think that her family is making excellent progress towards getting this closed she is doing a good job offloading we will try to get the air-fluidized bed but that is just not working out her insurance at this point is denying this. She does have an alternating air mattress at home although it has been apparently on static mode I did advise this to be switched to alternating mode. Samantha, Pratt ([id number]) [id number] Page 2 of 7 04-11-2023 upon evaluation today patient appears to be doing better in regard to her wound. She has been tolerating the dressing changes without complication. Fortunately I do not see any signs of active infection locally or systemically at this time. Her wound does have a lot of blue-green drainage which has made concerned about Pseudomonas hanging out of the area. 7/23; improved wound which is a pressure ulcer on the left buttock in a patient with advanced MS. She has a surface on her mattress at  home as well as a Roho cushion for her wheelchair. We have been using gentamicin and Hydrofera  Blue rope. Depth is slightly better today 05-09-2023 upon evaluation today patient presents for initial inspection in our clinic following her hospital admission she was in the hospital for just a little over a week having just gotten out on Friday of this past week. Fortunately she seems to be doing a lot better although it is suspected that she may have possible early osteomyelitis though it was not definitive on CT scan. This CT scan was performed on 04-24-2023. She is on antibiotics at this point for that. She also did have an abscess that was cleaned out to the wound is deeper at this time. Fortunately I do not see any evidence of worsening overall which is great news. The wound is deeper but again I think it was already deep and this way it was just hidden underneath and we had not accessed the full depth of the wound previous. The patient's hospital admission was actually from 04-24-2023 through 05-05-2023. It looks like that on discharge she was placed on antibiotics due to having Pseudomonas, Proteus, and E. coli at 1 point during the admission. She did also have COVID during the admission. It looks like she was discharged with Cipro 500 mg on 05-05-2023. 05-16-2023 upon evaluation today patient appears to be doing decently well in regard to her wound although the external opening is trying to close much faster than the internal is feeling in which is not what I want to see. 05-30-2023 upon evaluation today patient was actually just discharged from the hospital she was actually in the hospital from May 18, 2023 through May 24, 2023. During the course of her stay it was noted that her primary issue was acute osteomyelitis of the left pelvic region. Subsequently she does have noted a left gluteal abscess with osteomyelitis of the left ischial tuberosity. Again this wound has been present since around March 07, 2023. She was also previously admitted from July 29 through August 9 for worsening  drainage. At that point she was found to have a 1.5 x 2.5 cm left ischial fluid collection and abscess status post IandD. The cultures at that time grew the Pseudomonas, E. coli, and Proteus for which patient was treated with ciprofloxacin and metronidazole. It was following that time that I saw her here in the wound care center and again the wound actually appeared to be doing a little bit better at that time. However when she went to see her primary care provider they were concerned about the fact that she may indeed have osteomyelitis and wondered to come down to imaging at [facility]. That is subsequently when she had the MRI on May 18, 2023 which showed the osteomyelitis of the left ischial tuberosity. There was no fluid collection this is good news. The superficial cultures grew skin flora which were not overly convincing according to what I am being told in reviewing at this point in the record. General surgery and orthopedics were consulted and did not recommend surgical intervention. PICC line was placed May 20, 2023 where the patient was treated with daptomycin and ertapenem which she is currently taking as well as for MRSA coverage. Those antibiotics are set to continue until June 30, 2023. 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 We discussed the possibility of a wound  VAC although I have not made that decision as of yet. Patient does have a follow-up appointment infectious disease on June 01, 2023 this will be a video visit. Electronic Signature(s) Signed: 06/06/2023 1:00:58 PM By: [name] PA-C Entered By: [name] on 06/06/2023 10:00:57 -------------------------------------------------------------------------------- Physical Exam Details Patient Name: Date of Service: Samantha [name] Pratt, Samantha MARUSKA. 05/30/2023 12:30 PM Medical Record Number:  [id number] Patient Account Number: [id number] Date of Birth/Sex: Treating RN: March 16, 1971 (52 y.o. [name] Primary Care Provider: [name], [name] Other Clinician: Referring Provider: Treating Provider/Extender: [name] GO [place], [place] [name] in Treatment: 10 Constitutional Well-nourished and well-hydrated in no acute distress. Respiratory normal breathing without difficulty. Psychiatric this patient is able to make decisions and demonstrates good insight into disease process. Alert and Oriented x 3. pleasant and cooperative. Notes Upon inspection patient's wound does seem to be still having quite a bit of drainage unfortunately. I think that the IV antibiotics hopefully will help to continue to treat this and she should improve but nonetheless hyperbarics is something to consider I discussed that with her she is gena think about it and let me know. Electronic Signature(s) Signed: 06/06/2023 5:13:17 PM By: [name] PA-C Entered By: [name] on 06/06/2023 14:13:17 Suann Pratt ([id number]) [id number] Page 3 of 7 -------------------------------------------------------------------------------- Physician Orders Details Patient Name: Date of Service: MERVE, PELLICER 05/30/2023 12:30 PM Medical Record Number: [id number] Patient Account Number: [id number] Date of Birth/Sex: Treating RN: [birth year] (52 y.o. 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 Home Health Home Health Company: - [facility] fax: [phone] [facility] for wound care. May utilize formulary equivalent dressing for wound  treatment orders unless otherwise specified. Home Health Nurse may visit PRN to address patients wound care needs. Scheduled days for dressing changes to be completed; exception, patient has scheduled wound care visit that day. - Monday, Wednesday, Friday **Please direct any NON-WOUND related issues/requests for orders to patient's Primary Care Physician. **If current dressing causes regression in wound condition, may D/C ordered dressing product/s and apply Normal Saline Moist Dressing daily until next Wound Healing Center or Other MD appointment. **Notify Wound Healing Center of regression in wound condition at [phone]. Bathing/ [employer] wounds with antibacterial soap and water. Off-Loading Roho cushion for wheelchair Hospital bed/mattress A fluidized (Group 3) - Home Health, please order for patient. ir Turn and reposition every 2 hours - Float heels on pillow when in the bed. Additional Orders / Instructions Follow Nutritious Diet and Increase Protein Intake Wound Treatment Wound #1 - Gluteus Wound Laterality: Left Cleanser: Soap and Water (Home Health) 1 x Per Day/30 Days Discharge Instructions: Gently cleanse wound with antibacterial soap, rinse and pat dry prior to dressing wounds Prim Dressing: Hydrofera Blue Ready Transfer Foam, 4x5 (in/in) 1 x Per Day/30 Days ary Discharge Instructions: Apply Hydrofera Blue Ready to wound bed as directed Secondary Dressing: (BORDER) Zetuvit Plus SILICONE BORDER Dressing 4x4 (in/in) 1 x Per Day/30 Days Discharge Instructions: Please do not put silicone bordered dressings under wraps. Use non-bordered dressing only. Electronic Signature(s) Signed: 05/30/2023 1:30:45 PM By: Yevonne Pax RN Signed: 06/02/2023 2:02:35 PM By: [name] PA-C Entered By: Yevonne Pax on 05/30/2023 10:30:44 Hakala,  Rindy L ([id number]) [id number] Page 4 of  7 -------------------------------------------------------------------------------- Problem List Details Patient Name: Date of Service: Pratt, Samantha 05/30/2023 12:30 PM Medical Record Number: [id number] Patient Account Number: [id number] Date of Birth/Sex: Treating RN: [birth year] (52 y.o. 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 05/30/2023 12:30 PM Medical Record Number: [id number] Patient Account Number: [id number] Date of Birth/Sex: Treating RN: 01/24/1971 (52 y.o. [name] Primary Care Provider: [name], [name] Other Clinician: Referring Provider: Treating Provider/Extender: [name] GO [place], [place] [name] in Treatment: 10 Subjective Chief Complaint Information obtained from Patient Pressure ulcer left gluteal region History of Present Illness (HPI) 03-21-2023 upon evaluation today patient appears for initial evaluation regarding a wound that actually began around 03/07/2023. With that being said she has PECOLA, CROOKS ([id number]) [id number] Page 5 of 7 been having issues here with this area and then subsequently ended up in the hospital due to a high fever. She was discharged from the hospital and then started on oral antibiotics that was on 03-16-2023. The oral antibiotics are doxycycline and Keflex that she  is currently taking. With that being said she was diagnosed with multiple sclerosis in 2000 [name] she has been in a well motorized wheelchair since 2014. She is a pleasant individual and unfortunately this is the issue that she is having currently with a pressure ulcer although this is the good news is it is the first time she has had this happen she has an excellent caregiver that takes great care of her and the family is very attentive. 03-28-2023 upon evaluation today patient appears to be doing worse with regard to the irritation around the edges of her wound. The good news is the wound itself does not appear worse but the irritation is definitely not as good as even what I saw last week. In the interim since last week [name] her aide has actually broken her wrist and is no longer helping with the dressing changes I think this is part of the issue here. Fortunately I do not see any evidence of infection locally or systemically which is good news. 04-04-2023 upon evaluation today patient's wounds are actually showing signs of significant improvement which is great news. Fortunately I do not see any evidence of worsening overall I think that her family is making excellent progress towards getting this closed she is doing a good job offloading we will try to get the air-fluidized bed but that is just not working out her insurance at this point is denying this. She does have an alternating air mattress at home although it has been apparently on static mode I did advise this to be switched to alternating mode. 04-11-2023 upon evaluation today patient appears to be doing better in regard to her wound. She has been tolerating the dressing changes without complication. Fortunately I do not see any signs of active infection locally or systemically at this time. Her wound does have a lot of blue-green drainage which has made concerned about Pseudomonas hanging out of  the area. 7/23; improved wound which is a  pressure ulcer on the left buttock in a patient with advanced MS. She has a surface on her mattress at home as well as a Roho cushion for her wheelchair. We have been using gentamicin and Hydrofera Blue rope. Depth is slightly better today 05-09-2023 upon evaluation today patient presents for initial inspection in our clinic following her hospital admission she was in the hospital for just a little over a week having just gotten out on Friday of this past week. Fortunately she seems to be doing a lot better although it is suspected that she may have possible early osteomyelitis though it was not definitive on CT scan. This CT scan was performed on 04-24-2023. She is on antibiotics at this point for that. She also did have an abscess that was cleaned out to the wound is deeper at this time. Fortunately I do not see any evidence of worsening overall which is great news. The wound is deeper but again I think it was already deep and this way it was just hidden underneath and we had not accessed the full depth of the wound previous. The patient's hospital admission was actually from 04-24-2023 through 05-05-2023. It looks like that on discharge she was placed on antibiotics due to having Pseudomonas, Proteus, and E. coli at 1 point during the admission. She did also have COVID during the admission. It looks like she was discharged with Cipro 500 mg on 05-05-2023. 05-16-2023 upon evaluation today patient appears to be doing decently well in regard to her wound although the external opening is trying to close much faster than the internal is feeling in which is not what I want to see. 05-30-2023 upon evaluation today patient was actually just discharged from the hospital she was actually in the hospital from May 18, 2023 through May 24, 2023. During the course of her stay it was noted that her primary issue was acute osteomyelitis of the left pelvic region. Subsequently she does have noted a left gluteal abscess  with osteomyelitis of the left ischial tuberosity. Again this wound has been present since around March 07, 2023. She was also previously admitted from July 29 through August 9 for worsening drainage. At that point she was found to have a 1.5 x 2.5 cm left ischial fluid collection and abscess status post IandD. The cultures at that time grew the Pseudomonas, E. coli, and Proteus for which patient was treated with ciprofloxacin and metronidazole. It was following that time that I saw her here in the wound care center and again the wound actually appeared to be doing a little bit better at that time. However when she went to see her primary care provider they were concerned about the fact that she may indeed have osteomyelitis and wondered to come down to imaging at [facility]. That is subsequently when she had the MRI on May 18, 2023 which showed the osteomyelitis of the left ischial tuberosity. There was no fluid collection this is good news. The superficial cultures grew skin flora which were not overly convincing according to what I am being told in reviewing at this point in the record. General surgery and orthopedics were consulted and did not recommend surgical intervention. PICC line was placed May 20, 2023 where the patient was treated with daptomycin and ertapenem which she is currently taking as well as for MRSA coverage. Those antibiotics are set to continue until June 30, 2023. Subsequently the patient does have multiple sclerosis  followed by paraplegia and she is wheelchair/bedbound as far as her status is concerned and of course this complicates the healing. We discussed the possibility of a wound VAC although I have not made that decision as of yet. Patient does have a follow-up appointment infectious disease on June 01, 2023 this will be a video visit. Objective Constitutional Well-nourished and well-hydrated in no acute distress. Vitals Time Taken: 12:55 PM, Height: 64 in, Weight:  166 lbs, BMI: 28.5, Temperature: 97.9 F, Pulse: 73 bpm, Respiratory Rate: 18 breaths/min, Blood Pressure: 127/80 mmHg. Respiratory normal breathing without difficulty. Psychiatric this patient is able to make decisions and demonstrates good insight into disease process. Alert and Oriented x 3. pleasant and cooperative. General Notes: Upon inspection patient's wound does seem to be still having quite a bit of drainage unfortunately. I think that the IV antibiotics hopefully will help to continue to treat this and she should improve but nonetheless hyperbarics is something to consider I discussed that with her she is gena think about it and let me know. Integumentary (Hair, Skin) Wound #1 status is Open. Original cause of wound was Blister. The date acquired was: 03/07/2023. The wound has been in treatment 10 weeks. The wound is located on the Left Gluteus. The wound measures 1.4cm length x 0.7cm width x 4.3cm depth; 0.77cm^2 area and 3.31cm^3 volume. There is Fat Layer (Subcutaneous Tissue) exposed. There is no undermining noted, however, there is tunneling at 5:00 with a maximum distance of 6cm. There is a medium amount of serosanguineous drainage noted. There is large (67-100%) red granulation within the wound bed. There is no necrotic tissue within the wound bed. Assessment Pratt, Samantha ([id number]) [id number] Page 6 of 7 Active Problems ICD-10 Pressure ulcer of left buttock, stage 4 Multiple sclerosis Plan Follow-up Appointments: Return Appointment in 1 week. Home Health: Home Health Company: - [facility] fax: [phone] [facility] for wound care. May utilize formulary equivalent dressing for wound treatment orders unless otherwise specified. Home Health Nurse may visit PRN to address patients wound care needs. Scheduled days for dressing changes to be completed; exception, patient has scheduled wound care visit that day. - Monday, Wednesday,  Friday **Please direct any NON-WOUND related issues/requests for orders to patient's Primary Care Physician. **If current dressing causes regression in wound condition, may D/C ordered dressing product/s and apply Normal Saline Moist Dressing daily until next Wound Healing Center or Other MD appointment. **Notify Wound Healing Center of regression in wound condition at [phone]. Bathing/ Shower/ Hygiene: Wash wounds with antibacterial soap and water. Off-Loading: Roho cushion for wheelchair Hospital bed/mattress Air fluidized (Group 3) - Home Health, please order for patient. Turn and reposition every 2 hours - Float heels on pillow when in the bed. Additional Orders / Instructions: Follow Nutritious Diet and Increase Protein Intake WOUND #1: - Gluteus Wound Laterality: Left Cleanser: Soap and Water (Home Health) 1 x Per Day/30 Days Discharge Instructions: Gently cleanse wound with antibacterial soap, rinse and pat dry prior to dressing wounds Prim Dressing: Hydrofera Blue Ready Transfer Foam, 4x5 (in/in) 1 x Per Day/30 Days ary Discharge Instructions: Apply Hydrofera Blue Ready to wound bed as directed Secondary Dressing: (BORDER) Zetuvit Plus SILICONE BORDER Dressing 4x4 (in/in) 1 x Per Day/30 Days Discharge Instructions: Please do not put silicone bordered dressings under wraps. Use non-bordered dressing only. 1. Based on what I am seeing I do believe that the patient would benefit from potentially going forward with hyperbaric oxygen therapy she is going to consider it  and let me know what she would like to do. 2. I am also going to recommend that the patient should continue with appropriate offloading she seems to be doing very well with that. 3. We will continue with the [facility] for now I think this is good to be the best option better than the gauze. We will see patient back for reevaluation in 1 week here in the clinic. If anything worsens or changes patient will contact  our office for additional recommendations. Electronic Signature(s) Signed: 06/06/2023 5:13:46 PM By: [name] PA-C Entered By: [name] on 06/06/2023 14:13:46 -------------------------------------------------------------------------------- SuperBill Details Patient Name: Date of Service: [name], Raynetta L. 05/30/2023 Medical Record Number: [id number] Patient Account Number: [id number] Date of Birth/Sex: Treating RN: 1971/04/30 (52 y.o. 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

## 2023-05-30 NOTE — Progress Notes (Signed)
MERAL, MORAVEC ([id number]) [id number] Page 1 of 9 Visit Report for 05/30/2023 Arrival Information Details Patient Name: Date of Service: [name], [name] 05/30/2023 12:30 PM Medical Record Number: [id number] Patient Account Number: [id number] Date of Birth/Sex: Treating RN: 1971-04-19 (52 y.o. [name] Primary Care Chrystopher Stangl: [name], [name] Other Clinician: Referring Chihiro Frey: Treating Seryna Marek/Extender: [name] [place] [name] in Treatment: 10 Visit Information History Since Last Visit Added or deleted any medications: No Patient Arrived: Wheel Chair Any new allergies or adverse reactions: No Arrival Time: 12:34 Had a fall or experienced change in No Accompanied By: caregiver activities of daily living that may affect Transfer Assistance: None risk of falls: Patient Identification Verified: Yes Signs or symptoms of abuse/neglect since last visito No Secondary Verification Process Completed: Yes Hospitalized since last visit: No Patient Requires Transmission-Based Precautions: No Implantable device outside of the clinic excluding No Patient Has Alerts: Yes cellular tissue based products placed in the center Patient Alerts: NOT DIABETIC since last visit: Has Dressing in Place as Prescribed: Yes Pain Present Now: No Electronic Signature(s) Signed: 05/30/2023 12:55:15 PM By: Yevonne Pax RN Entered By: Yevonne Pax on 05/30/2023 12:55:15 -------------------------------------------------------------------------------- Clinic Level of Care Assessment Details Patient Name: Date of Service: [name], [name] 05/30/2023 12:30 PM Medical Record Number: [id number] Patient Account Number: [id number] Date of Birth/Sex: Treating RN: 04/26/1971 (52 y.o. [name] Primary Care Manroop Jakubowicz: Bertram Denver Other Clinician: Referring Tesa Meadors: Treating Getsemani Lindon/Extender: [name] [place] [name] in Treatment: 10 Clinic  Level of Care Assessment Items TOOL 4 Quantity Score X- 1 0 Use when only an EandM is performed on FOLLOW-UP visit ASSESSMENTS - Nursing Assessment / Reassessment X- 1 10 Reassessment of Co-morbidities (includes updates in patient status) X- 1 5 Reassessment of Adherence to Treatment Plan BURNICE, KURZAWA ([id number]) [phone] Page 2 of 9 ASSESSMENTS - Wound and Skin A ssessment / Reassessment X - Simple Wound Assessment / Reassessment - one wound 1 5 []  - 0 Complex Wound Assessment / Reassessment - multiple wounds []  - 0 Dermatologic / Skin Assessment (not related to wound area) ASSESSMENTS - Focused Assessment []  - 0 Circumferential Edema Measurements - multi extremities []  - 0 Nutritional Assessment / Counseling / Intervention []  - 0 Lower Extremity Assessment (monofilament, tuning fork, pulses) []  - 0 Peripheral Arterial Disease Assessment (using hand held doppler) ASSESSMENTS - Ostomy and/or Continence Assessment and Care []  - 0 Incontinence Assessment and Management []  - 0 Ostomy Care Assessment and Management (repouching, etc.) PROCESS - Coordination of Care X - Simple Patient / Family Education for ongoing care 1 15 []  - 0 Complex (extensive) Patient / Family Education for ongoing care []  - 0 Staff obtains Chiropractor, Records, T Results / Process Orders est []  - 0 Staff telephones HHA, Nursing Homes / Clarify orders / etc []  - 0 Routine Transfer to another Facility (non-emergent condition) []  - 0 Routine Hospital Admission (non-emergent condition) []  - 0 New Admissions / Manufacturing engineer / Ordering NPWT Apligraf, etc. , []  - 0 Emergency Hospital Admission (emergent condition) X- 1 10 Simple Discharge Coordination []  - 0 Complex (extensive) Discharge Coordination PROCESS - Special Needs []  - 0 Pediatric / Minor Patient Management []  - 0 Isolation Patient Management []  - 0 Hearing / Language / Visual special needs []   - 0 Assessment of Community assistance (transportation, D/C planning, etc.) []  - 0 Additional assistance / Altered mentation []  - 0 Support Surface(s) Assessment (bed,  cushion, seat, etc.) INTERVENTIONS - Wound Cleansing / Measurement X - Simple Wound Cleansing - one wound 1 5 []  - 0 Complex Wound Cleansing - multiple wounds X- 1 5 Wound Imaging (photographs - any number of wounds) []  - 0 Wound Tracing (instead of photographs) X- 1 5 Simple Wound Measurement - one wound []  - 0 Complex Wound Measurement - multiple wounds INTERVENTIONS - Wound Dressings X - Small Wound Dressing one or multiple wounds 1 10 []  - 0 Medium Wound Dressing one or multiple wounds []  - 0 Large Wound Dressing one or multiple wounds []  - 0 Application of Medications - topical []  - 0 Application of Medications - injection INTERVENTIONS - Miscellaneous []  - 0 External ear exam TYQUESHA, MCCONATHY ([id number]) [id number] Page 3 of 9 []  - 0 Specimen Collection (cultures, biopsies, blood, body fluids, etc.) []  - 0 Specimen(s) / Culture(s) sent or taken to Lab for analysis []  - 0 Patient Transfer (multiple staff / Michiel Sites Lift / Similar devices) []  - 0 Simple Staple / Suture removal (25 or less) []  - 0 Complex Staple / Suture removal (26 or more) []  - 0 Hypo / Hyperglycemic Management (close monitor of Blood Glucose) []  - 0 Ankle / Brachial Index (ABI) - do not check if billed separately X- 1 5 Vital Signs Has the patient been seen at the hospital within the last three years: Yes Total Score: 75 Level Of Care: New/Established - Level 2 Electronic Signature(s) Unsigned Entered ByYevonne Pax on 05/30/2023 13:31:13 -------------------------------------------------------------------------------- Encounter Discharge Information Details Patient Name: Date of Service: [name], [name] 05/30/2023 12:30 PM Medical Record Number: [id number] Patient Account Number:  [id number] Date of Birth/Sex: Treating RN: 04/02/1971 (52 y.o. [name] Primary Care Rekisha Welling: Bertram Denver Other Clinician: Referring Parish Augustine: Treating Carlisle Torgeson/Extender: [name] [place] [name] in Treatment: 10 Encounter Discharge Information Items Discharge Condition: Stable Ambulatory Status: Wheelchair Discharge Destination: Home Transportation: Private Auto Accompanied By: caregiver Schedule Follow-up Appointment: Yes Clinical Summary of Care: Electronic Signature(s) Signed: 05/30/2023 1:53:53 PM By: Yevonne Pax RN Entered By: Yevonne Pax on 05/30/2023 13:53:53 Lower Extremity Assessment Details -------------------------------------------------------------------------------- [name] ([id number]) [id number] Page 4 of 9 Patient Name: Date of Service: [name], [name] 05/30/2023 12:30 PM Medical Record Number: [id number] Patient Account Number: [id number] Date of Birth/Sex: Treating RN: Apr 17, 1971 (52 y.o. [name] Primary Care Mixtli Reno: Bertram Denver Other Clinician: Referring Kennard Fildes: Treating Shereen Marton/Extender: [name] [place] [name] in Treatment: 10 Electronic Signature(s) Signed: 05/30/2023 12:57:38 PM By: Yevonne Pax RN Entered By: Yevonne Pax on 05/30/2023 12:57:38 -------------------------------------------------------------------------------- Multi Wound Chart Details Patient Name: Date of Service: [name], [name] 05/30/2023 12:30 PM Medical Record Number: [id number] Patient Account Number: [id number] Date of Birth/Sex: Treating RN: 03-Jun-1971 (52 y.o. [name] Primary Care Monifah Freehling: [name], [name] Other Clinician: Referring Jovahn Breit: Treating Loron Weimer/Extender: [name] [place] [name] in Treatment: 10 Vital Signs Height(in): 64 Pulse(bpm): 73 Weight(lbs): 166 Blood Pressure(mmHg): 127/80 Body Mass Index(BMI): 28.5 Temperature(F):  97.9 Respiratory Rate(breaths/min): 18 [1:Photos:] [N/A:N/A] Left Gluteus N/A N/A Wound Location: Blister N/A N/A Wounding Event: Pressure Ulcer N/A N/A Primary Etiology: Neuropathy N/A N/A Comorbid History: 03/07/2023 N/A N/A Date Acquired: 10 N/A N/A Weeks of Treatment: Open N/A N/A Wound Status: No N/A N/A Wound Recurrence: 1.4x0.7x4.3 N/A N/A Measurements L x W x D (cm) 0.77 N/A N/A A (cm) : rea 3.31 N/A N/A Volume (cm) : 45.50%  N/A N/A % Reduction in A rea: -11.50% N/A N/A % Reduction in Volume: 5 Position 1 (o'clock): 6 Maximum Distance 1 (cm): Yes N/A N/A Tunneling: Category/Stage IV N/A N/A Classification: Medium N/A N/A Exudate A mount: Serosanguineous N/A N/A Exudate Type: red, brown N/A N/A Exudate Color: Large (67-100%) N/A N/A Granulation A mount: Red N/A N/A Granulation Quality: None Present (0%) N/A N/A Necrotic A mount: Fat Layer (Subcutaneous Tissue): Yes N/A N/A Exposed Structures: [name], [name] ([id number]) [id number] Page 5 of 9 Fascia: No Tendon: No Muscle: No Joint: No Bone: No None N/A N/A Epithelialization: Treatment Notes Electronic Signature(s) Signed: 05/30/2023 12:57:45 PM By: Yevonne Pax RN Entered By: Yevonne Pax on 05/30/2023 12:57:44 -------------------------------------------------------------------------------- Multi-Disciplinary Care Plan Details Patient Name: Date of Service: [name], Memori L. 05/30/2023 12:30 PM Medical Record Number: [id number] Patient Account Number: [id number] Date of Birth/Sex: Treating RN: 04/13/1971 (52 y.o. [name] Primary Care Radwan Cowley: [name], [name] Other Clinician: Referring Amari Burnsworth: Treating Joanette Silveria/Extender: [name] [place] [name] in Treatment: 10 Active Inactive Wound/Skin Impairment Nursing Diagnoses: Impaired tissue integrity Knowledge deficit related to smoking impact on wound healing Knowledge deficit related to  ulceration/compromised skin integrity Goals: Patient/caregiver will verbalize understanding of skin care regimen Date Initiated: 03/21/2023 Target Resolution Date: 06/21/2023 Goal Status: Active Ulcer/skin breakdown will have a volume reduction of 30% by week 4 Date Initiated: 03/21/2023 Date Inactivated: 04/18/2023 Target Resolution Date: 04/18/2023 Goal Status: Unmet Unmet Reason: comorbidities Ulcer/skin breakdown will have a volume reduction of 50% by week 8 Date Initiated: 03/21/2023 Date Inactivated: 05/16/2023 Target Resolution Date: 05/16/2023 Goal Status: Unmet Unmet Reason: comorbidities Ulcer/skin breakdown will have a volume reduction of 80% by week 12 Date Initiated: 03/21/2023 Target Resolution Date: 06/13/2023 Goal Status: Active Ulcer/skin breakdown will heal within 14 weeks Date Initiated: 03/21/2023 Target Resolution Date: 06/27/2023 Goal Status: Active Interventions: Assess patient/caregiver ability to obtain necessary supplies Assess patient/caregiver ability to perform ulcer/skin care regimen upon admission and as needed Assess ulceration(s) every visit Treatment Activities: Patient referred to home care : 03/21/2023 Skin care regimen initiated : 03/21/2023 Notes: [name], [name] ([id number]) [id number] Page 6 of 9 Electronic Signature(s) Signed: 05/30/2023 12:58:16 PM By: Yevonne Pax RN Entered By: Yevonne Pax on 05/30/2023 12:58:14 -------------------------------------------------------------------------------- Pain Assessment Details Patient Name: Date of Service: [name], [name] 05/30/2023 12:30 PM Medical Record Number: [id number] Patient Account Number: [id number] Date of Birth/Sex: Treating RN: Nov 27, 1970 (52 y.o. [name] Primary Care Ezell Melikian: [name], [name] Other Clinician: Referring Tikita Mabee: Treating Finnley Larusso/Extender: [name] [place] [name] in Treatment: 10 Active Problems Location of Pain  Severity and Description of Pain Patient Has Paino No Site Locations Pain Management and Medication Current Pain Management: Electronic Signature(s) Signed: 05/30/2023 12:55:59 PM By: Yevonne Pax RN Entered By: Yevonne Pax on 05/30/2023 12:55:58 -------------------------------------------------------------------------------- Patient/Caregiver Education Details Patient Name: Date of Service: [name], [name] 9/3/2024andnbsp12:30 PM [name], [name] ([id number]) [id number] Page 7 of 9 Medical Record Number: [id number] Patient Account Number: [id number] Date of Birth/Gender: Treating RN: 20-Apr-1971 (52 y.o. [name] Primary Care Physician: Bertram Denver Other Clinician: Referring Physician: Treating Physician/Extender: [name] [place] [name] in Treatment: 10 Education Assessment Education Provided To: Patient Education Topics Provided Wound/Skin Impairment: Handouts: Caring for Your Ulcer Methods: Explain/Verbal Responses: State content correctly Electronic Signature(s) Unsigned Entered ByYevonne Pax on 05/30/2023 12:58:29 -------------------------------------------------------------------------------- Wound Assessment Details Patient Name: Date of Service: [name], [name]  05/30/2023 12:30 PM Medical Record Number: [id number] Patient Account Number: [id number] Date of Birth/Sex: Treating RN: 12/26/1970 (52 y.o. [name] Primary Care Keiston Manley: [name], [name] Other Clinician: Referring Castiel Lauricella: Treating Charleen Madera/Extender: [name] [place], [place] UREN Weeks in Treatment: 10 Wound Status Wound Number: 1 Primary Etiology: Pressure Ulcer Wound Location: Left Gluteus Wound Status: Open Wounding Event: Blister Comorbid History: Neuropathy Date Acquired: 03/07/2023 Weeks Of Treatment: 10 Clustered Wound: No Photos Wound Measurements Length: (cm) 1.4 Width: (cm) 0.7 Depth: (cm) 4.3 Farino, Starletta L  ([id number]) Area: (cm) Volume: (cm) % Reduction in Area: 45.5% % Reduction in Volume: -11.5% Epithelialization: None [id number] Page 8 of 9 0.77 Tunneling: Yes 3.31 Position (o'clock): 5 Maximum Distance: (cm) 6 Undermining: No Wound Description Classification: Category/Stage IV Exudate Amount: Medium Exudate Type: Serosanguineous Exudate Color: red, brown Foul Odor After Cleansing: No Slough/Fibrino No Wound Bed Granulation Amount: Large (67-100%) Exposed Structure Granulation Quality: Red Fascia Exposed: No Necrotic Amount: None Present (0%) Fat Layer (Subcutaneous Tissue) Exposed: Yes Tendon Exposed: No Muscle Exposed: No Joint Exposed: No Bone Exposed: No Treatment Notes Wound #1 (Gluteus) Wound Laterality: Left Cleanser Soap and Water Discharge Instruction: Gently cleanse wound with antibacterial soap, rinse and pat dry prior to dressing wounds Peri-Wound Care Topical Primary Dressing Hydrofera Blue Ready Transfer Foam, 4x5 (in/in) Discharge Instruction: Apply Hydrofera Blue Ready to wound bed as directed Secondary Dressing (BORDER) Zetuvit Plus SILICONE BORDER Dressing 4x4 (in/in) Discharge Instruction: Please do not put silicone bordered dressings under wraps. Use non-bordered dressing only. Secured With Compression Wrap Compression Stockings Facilities manager) Signed: 05/30/2023 12:57:24 PM By: Yevonne Pax RN Previous Signature: 05/30/2023 12:56:46 PM Version By: Yevonne Pax RN Entered By: Yevonne Pax on 05/30/2023 12:57:24 -------------------------------------------------------------------------------- Vitals Details Patient Name: Date of Service: [name], Shauntelle L. 05/30/2023 12:30 PM Medical Record Number: [id number] Patient Account Number: [id number] Date of Birth/Sex: Treating RN: October 07, 1970 (52 y.o. [name] Primary Care Chaka Jefferys: Bertram Denver Other Clinician: Referring Jaryiah Mehlman: Treating  Kwaku Mostafa/Extender: [name] [place] [name] in Treatment: [address] ([id number]) [id number] Page 9 of 9 Vital Signs Time Taken: 12:55 Temperature (F): 97.9 Height (in): 64 Pulse (bpm): 73 Weight (lbs): 166 Respiratory Rate (breaths/min): 18 Body Mass Index (BMI): 28.5 Blood Pressure (mmHg): 127/80 Reference Range: 80 - 120 mg / dl Electronic Signature(s) Signed: 05/30/2023 12:55:44 PM By: Yevonne Pax RN Entered By: Yevonne Pax on 05/30/2023 12:55:43

## 2023-06-01 ENCOUNTER — Telehealth: Admit: 2023-06-01 | Discharge: 2023-06-02 | Payer: MEDICARE | Attending: Family | Primary: Family

## 2023-06-01 DIAGNOSIS — Z792 Long term (current) use of antibiotics: Principal | ICD-10-CM

## 2023-06-01 DIAGNOSIS — L0231 Cutaneous abscess of buttock: Principal | ICD-10-CM

## 2023-06-01 DIAGNOSIS — M86152 Other acute osteomyelitis, left femur: Principal | ICD-10-CM

## 2023-06-06 ENCOUNTER — Encounter: Payer: 59 | Admitting: Physician Assistant

## 2023-06-06 DIAGNOSIS — L89324 Pressure ulcer of left buttock, stage 4: Secondary | ICD-10-CM | POA: Diagnosis not present

## 2023-06-06 NOTE — Progress Notes (Addendum)
Signed: 06/06/2023 12:55:49 PM By: Yevonne Pax RN Entered By: Yevonne Pax on 06/06/2023 12:55:49 -------------------------------------------------------------------------------- Multi-Disciplinary Care Plan Details Patient Name: Date of Service: [name], [name] 06/06/2023 12:30 PM Medical Record Number: [id number] Patient Account Number: [id number] Date of Birth/Sex: Treating RN: [birth year] (52 y.o. [name] Primary Care Leanard Dimaio: Bertram Denver Other Clinician: Referring Taje Littler: Treating Burech Mcfarland/Extender: [name] GO [place], [place] [name] in Treatment: [address], [place] L ([id number]) [id number] Page 5 of 8 Active Inactive Wound/Skin Impairment Nursing Diagnoses: Impaired tissue integrity Knowledge deficit related to smoking impact on wound healing Knowledge deficit related to ulceration/compromised skin integrity Goals: Patient/caregiver will verbalize understanding of skin care regimen Date Initiated: 03/21/2023 Target Resolution Date: 06/21/2023 Goal Status: Active Ulcer/skin breakdown will have a volume reduction of 30% by week 4 Date Initiated: 03/21/2023 Date Inactivated: 04/18/2023 Target Resolution Date: 04/18/2023 Goal Status: Unmet Unmet Reason: comorbidities Ulcer/skin breakdown will have a volume reduction of 50% by week 8 Date Initiated: 03/21/2023 Date Inactivated: 05/16/2023 Target Resolution Date: 05/16/2023 Goal Status: Unmet Unmet Reason: comorbidities Ulcer/skin breakdown will have a volume reduction of 80% by week 12 Date Initiated: 03/21/2023 Target Resolution Date: 06/13/2023 Goal Status: Active Ulcer/skin breakdown will heal within 14 weeks Date Initiated:  03/21/2023 Target Resolution Date: 06/27/2023 Goal Status: Active Interventions: Assess patient/caregiver ability to obtain necessary supplies Assess patient/caregiver ability to perform ulcer/skin care regimen upon admission and as needed Assess ulceration(s) every visit Treatment Activities: Patient referred to home care : 03/21/2023 Skin care regimen initiated : 03/21/2023 Notes: Electronic Signature(s) Signed: 06/06/2023 12:56:01 PM By: Yevonne Pax RN Entered By: Yevonne Pax on 06/06/2023 12:56:01 -------------------------------------------------------------------------------- Pain Assessment Details Patient Name: Date of Service: [name], [name] 06/06/2023 12:30 PM Medical Record Number: [id number] Patient Account Number: [id number] Date of Birth/Sex: Treating RN: [birth year] (52 y.o. [name] Primary Care Annisha Baar: Bertram Denver Other Clinician: Referring Rease Wence: Treating Charlene Detter/Extender: [name] [place], [place] [name] in Treatment: 11 Active Problems Location of Pain Severity and Description of Pain Patient Has Paino No Site Locations [place], [place] L ([id number]) [phone].pdf Page 6 of 8 Pain Management and Medication Current Pain Management: Electronic Signature(s) Signed: 06/06/2023 12:54:42 PM By: Yevonne Pax RN Entered By: Yevonne Pax on 06/06/2023 12:54:42 -------------------------------------------------------------------------------- Patient/Caregiver Education Details Patient Name: Date of Service: MO [name] 9/10/2024andnbsp12:30 PM Medical Record Number: [id number] Patient Account Number: [id number] Date of Birth/Gender: Treating RN: 1970-10-25 (52 y.o. [name] Primary Care Physician: Bertram Denver Other Clinician: Referring Physician: Treating Physician/Extender: [name] [place], [place] [name] in Treatment: 11 Education Assessment Education Provided To: Patient Education Topics  Provided Pressure: Handouts: Pressure Injury: Prevention and Offloading Methods: Explain/Verbal Responses: State content correctly Electronic Signature(s) Signed: 06/09/2023 1:15:19 PM By: Yevonne Pax RN Entered By: Yevonne Pax on 06/06/2023 12:56:16 [name] ([id number]) [id number] Page 7 of 8 -------------------------------------------------------------------------------- Wound Assessment Details Patient Name: Date of Service: Pratt, Samantha 06/06/2023 12:30 PM Medical Record Number: [id number] Patient Account Number: [id number] Date of Birth/Sex: Treating RN: 25-Jan-1971 (52 y.o. [name] Primary Care Sumayyah Custodio: [name], [name] Other Clinician: Referring Chanel Mcadams: Treating Harim Bi/Extender: [name] [place], [place] UREN Weeks in Treatment: 11 Wound Status Wound Number: 1 Primary Etiology: Pressure Ulcer Wound Location: Left Gluteus Wound Status: Open Wounding Event: Blister Comorbid History: Neuropathy Date Acquired: 03/07/2023 Weeks Of Treatment: 11 Clustered Wound: No Photos Wound Measurements Length: (cm) 1.4 Width: (cm) 0.5 Depth: (cm) 4  Area: (cm) 0.55 Volume: (cm) 2.199 % Reduction in Area: 61.1% % Reduction in Volume: 25.9% Epithelialization: None Tunneling: Yes Position (o'clock): 5 Maximum Distance: (cm) 5 Wound Description Classification: Category/Stage IV Exudate Amount: Medium Exudate Type: Serosanguineous Exudate Color: red, brown Foul Odor After Cleansing: No Slough/Fibrino No Wound Bed Granulation Amount: Large (67-100%) Exposed Structure Granulation Quality: Red Fascia Exposed: No Necrotic Amount: None Present (0%) Fat Layer (Subcutaneous Tissue) Exposed: Yes Tendon Exposed: No Muscle Exposed: No Joint Exposed: No Bone Exposed: No Electronic Signature(s) Signed: 06/06/2023 12:55:26 PM By: Yevonne Pax RN Entered By: Yevonne Pax on 06/06/2023 12:55:26 [name] ([id number])  [id number] Page 8 of 8 -------------------------------------------------------------------------------- Vitals Details Patient Name: Date of Service: Pratt, Samantha 06/06/2023 12:30 PM Medical Record Number: [id number] Patient Account Number: [id number] Date of Birth/Sex: Treating RN: 05-15-1971 (52 y.o. [name] Primary Care Alma Mohiuddin: [name], [name] Other Clinician: Referring Orland Visconti: Treating [place]/Extender: [name] [place], [place] [name] in Treatment: 11 Vital Signs Time Taken: 12:35 Temperature (F): 97.7 Height (in): 64 Pulse (bpm): 77 Weight (lbs): 166 Respiratory Rate (breaths/min): 18 Body Mass Index (BMI): 28.5 Blood Pressure (mmHg): 120/58 Reference Range: 80 - 120 mg / dl Electronic Signature(s) Signed: 06/06/2023 12:54:35 PM By: Yevonne Pax RN Entered By: Yevonne Pax on 06/06/2023 12:54:35  Signed: 06/06/2023 12:55:49 PM By: Yevonne Pax RN Entered By: Yevonne Pax on 06/06/2023 12:55:49 -------------------------------------------------------------------------------- Multi-Disciplinary Care Plan Details Patient Name: Date of Service: [name], [name] 06/06/2023 12:30 PM Medical Record Number: [id number] Patient Account Number: [id number] Date of Birth/Sex: Treating RN: [birth year] (52 y.o. [name] Primary Care Leanard Dimaio: Bertram Denver Other Clinician: Referring Taje Littler: Treating Burech Mcfarland/Extender: [name] [place], [place] [name] in Treatment: [address], Aamya L ([id number]) [id number] Page 5 of 8 Active Inactive Wound/Skin Impairment Nursing Diagnoses: Impaired tissue integrity Knowledge deficit related to smoking impact on wound healing Knowledge deficit related to ulceration/compromised skin integrity Goals: Patient/caregiver will verbalize understanding of skin care regimen Date Initiated: 03/21/2023 Target Resolution Date: 06/21/2023 Goal Status: Active Ulcer/skin breakdown will have a volume reduction of 30% by week 4 Date Initiated: 03/21/2023 Date Inactivated: 04/18/2023 Target Resolution Date: 04/18/2023 Goal Status: Unmet Unmet Reason: comorbidities Ulcer/skin breakdown will have a volume reduction of 50% by week 8 Date Initiated: 03/21/2023 Date Inactivated: 05/16/2023 Target Resolution Date: 05/16/2023 Goal Status: Unmet Unmet Reason: comorbidities Ulcer/skin breakdown will have a volume reduction of 80% by week 12 Date Initiated: 03/21/2023 Target Resolution Date: 06/13/2023 Goal Status: Active Ulcer/skin breakdown will heal within 14 weeks Date Initiated:  03/21/2023 Target Resolution Date: 06/27/2023 Goal Status: Active Interventions: Assess patient/caregiver ability to obtain necessary supplies Assess patient/caregiver ability to perform ulcer/skin care regimen upon admission and as needed Assess ulceration(s) every visit Treatment Activities: Patient referred to home care : 03/21/2023 Skin care regimen initiated : 03/21/2023 Notes: Electronic Signature(s) Signed: 06/06/2023 12:56:01 PM By: Yevonne Pax RN Entered By: Yevonne Pax on 06/06/2023 12:56:01 -------------------------------------------------------------------------------- Pain Assessment Details Patient Name: Date of Service: [name], [name] 06/06/2023 12:30 PM Medical Record Number: [id number] Patient Account Number: [id number] Date of Birth/Sex: Treating RN: [birth year] (52 y.o. [name] Primary Care Annisha Baar: Bertram Denver Other Clinician: Referring Rease Wence: Treating Charlene Detter/Extender: [name] [place], [place] [name] in Treatment: 11 Active Problems Location of Pain Severity and Description of Pain Patient Has Paino No Site Locations [place], [place] L ([id number]) [phone].pdf Page 6 of 8 Pain Management and Medication Current Pain Management: Electronic Signature(s) Signed: 06/06/2023 12:54:42 PM By: Yevonne Pax RN Entered By: Yevonne Pax on 06/06/2023 12:54:42 -------------------------------------------------------------------------------- Patient/Caregiver Education Details Patient Name: Date of Service: MO [name] 9/10/2024andnbsp12:30 PM Medical Record Number: [id number] Patient Account Number: [id number] Date of Birth/Gender: Treating RN: 1970-10-25 (52 y.o. [name] Primary Care Physician: Bertram Denver Other Clinician: Referring Physician: Treating Physician/Extender: [name] [place], [place] [name] in Treatment: 11 Education Assessment Education Provided To: Patient Education Topics  Provided Pressure: Handouts: Pressure Injury: Prevention and Offloading Methods: Explain/Verbal Responses: State content correctly Electronic Signature(s) Signed: 06/09/2023 1:15:19 PM By: Yevonne Pax RN Entered By: Yevonne Pax on 06/06/2023 12:56:16 [name] ([id number]) [id number] Page 7 of 8 -------------------------------------------------------------------------------- Wound Assessment Details Patient Name: Date of Service: Pratt, Samantha 06/06/2023 12:30 PM Medical Record Number: [id number] Patient Account Number: [id number] Date of Birth/Sex: Treating RN: 25-Jan-1971 (52 y.o. [name] Primary Care Sumayyah Custodio: [name], [name] Other Clinician: Referring Chanel Mcadams: Treating Harim Bi/Extender: [name] [place], [place] UREN Weeks in Treatment: 11 Wound Status Wound Number: 1 Primary Etiology: Pressure Ulcer Wound Location: Left Gluteus Wound Status: Open Wounding Event: Blister Comorbid History: Neuropathy Date Acquired: 03/07/2023 Weeks Of Treatment: 11 Clustered Wound: No Photos Wound Measurements Length: (cm) 1.4 Width: (cm) 0.5 Depth: (cm) 4  JANIYLA, DONDLINGER ([id number]) [phone].pdf Page 1 of 8 Visit Report for 06/06/2023 Arrival Information Details Patient Name: Date of Service: JEWELDINE, THURNAU 06/06/2023 12:30 PM Medical Record Number: [id number] Patient Account Number: [id number] Date of Birth/Sex: Treating RN: 06-Dec-1970 (52 y.o. [name] Primary Care Jasean Ambrosia: [name], [name] Other Clinician: Referring Seanpatrick Maisano: Treating Zya Finkle/Extender: [name] [place], [place] [name] in Treatment: 11 Visit Information History Since Last Visit Added or deleted any medications: No Patient Arrived: Wheel Chair Any new allergies or adverse reactions: No Arrival Time: 12:33 Had a fall or experienced change in No Accompanied By: caregiver activities of daily living that may affect Transfer Assistance: Michiel Sites Lift risk of falls: Patient Identification Verified: Yes Signs or symptoms of abuse/neglect since last visito No Secondary Verification Process Completed: Yes Hospitalized since last visit: No Patient Requires Transmission-Based Precautions: No Implantable device outside of the clinic excluding No Patient Has Alerts: Yes cellular tissue based products placed in the center Patient Alerts: NOT DIABETIC since last visit: Has Dressing in Place as Prescribed: Yes Pain Present Now: No Electronic Signature(s) Signed: 06/06/2023 12:54:16 PM By: Yevonne Pax RN Entered By: Yevonne Pax on 06/06/2023 12:54:16 -------------------------------------------------------------------------------- Clinic Level of Care Assessment Details Patient Name: Date of Service: EVANI, BURDI 06/06/2023 12:30 PM Medical Record Number: [id number] Patient Account Number: [id number] Date of Birth/Sex: Treating RN: 1971-01-27 (52 y.o. [name] Primary Care Alastair Hennes: Bertram Denver Other Clinician: Referring Esmond Hinch: Treating Dorathy Stallone/Extender: [name] [place], [place] [name] in Treatment:  11 Clinic Level of Care Assessment Items TOOL 4 Quantity Score X- 1 0 Use when only an EandM is performed on FOLLOW-UP visit ASSESSMENTS - Nursing Assessment / Reassessment X- 1 10 Reassessment of Co-morbidities (includes updates in patient status) X- 1 5 Reassessment of Adherence to Treatment Plan WEALTHA, DOMINGO ([id number]) [id number].pdf Page 2 of 8 ASSESSMENTS - Wound and Skin A ssessment / Reassessment X - Simple Wound Assessment / Reassessment - one wound 1 5 []  - 0 Complex Wound Assessment / Reassessment - multiple wounds []  - 0 Dermatologic / Skin Assessment (not related to wound area) ASSESSMENTS - Focused Assessment []  - 0 Circumferential Edema Measurements - multi extremities []  - 0 Nutritional Assessment / Counseling / Intervention []  - 0 Lower Extremity Assessment (monofilament, tuning fork, pulses) []  - 0 Peripheral Arterial Disease Assessment (using hand held doppler) ASSESSMENTS - Ostomy and/or Continence Assessment and Care []  - 0 Incontinence Assessment and Management []  - 0 Ostomy Care Assessment and Management (repouching, etc.) PROCESS - Coordination of Care X - Simple Patient / Family Education for ongoing care 1 15 []  - 0 Complex (extensive) Patient / Family Education for ongoing care []  - 0 Staff obtains Chiropractor, Records, T Results / Process Orders est []  - 0 Staff telephones HHA, Nursing Homes / Clarify orders / etc []  - 0 Routine Transfer to another Facility (non-emergent condition) []  - 0 Routine Hospital Admission (non-emergent condition) []  - 0 New Admissions / Manufacturing engineer / Ordering NPWT Apligraf, etc. , []  - 0 Emergency Hospital Admission (emergent condition) X- 1 10 Simple Discharge Coordination []  - 0 Complex (extensive) Discharge Coordination PROCESS - Special Needs []  - 0 Pediatric / Minor Patient Management []  - 0 Isolation Patient Management []  - 0 Hearing / Language / Visual special  needs []  - 0 Assessment of Community assistance (transportation, D/C planning, etc.) []  - 0 Additional assistance / Altered mentation []  - 0 Support Surface(s) Assessment (

## 2023-06-06 NOTE — Progress Notes (Signed)
ANNISSIA, CANTALUPO ([id number]) [id number] Page 1 of 1 Visit Report for 06/06/2023 HBO Patient Questionnaire Details Patient Name: Date of Service: [name], [name] 06/06/2023 12:30 PM Medical Record Number: [id number] Patient Account Number: [id number] Date of Birth/Sex: Treating RN: 1971/07/27 (52 y.o. [name] Primary Care Mehgan Santmyer: [name], [name] Other Clinician: Referring Jersie Beel: Treating Zoe Goonan/Extender: [name] [place], [place] [name] in Treatment: 11 HBO Patient Questionnaire Items Answer A "Yes" answers must be brought to the hyperbaric physician's attention. ny Breathing or Lung problemso No Currently use tobacco productso No Used tobacco products in the pasto No Heart problemso No Do you take water pills (diuretic)o No Diabeteso No Dialysiso No Eye problems like glaucomao No Ear problems or surgeryo No Sinus Problemso No Cancero No Confinement Anxiety (Claustrophobia- fear of confined places)o Yes Any medical implants/devices that are fully or partially implanted or attached to your bodyo No Pregnanto No Seizureso No Date of last: Chest x-ray: 05/20/2023 EKG: 05/20/2023 CBC: 05/20/2023 Electronic Signature(s) Signed: 06/06/2023 1:39:37 PM By: Yevonne Pax RN Entered By: Yevonne Pax on 06/06/2023 10:39:37

## 2023-06-06 NOTE — Progress Notes (Addendum)
Health, please order for patient. ir Turn and reposition every 2 hours - Float heels on pillow when in the bed. Additional Orders / Instructions Follow Nutritious Diet and Increase Protein Intake Negative Pressure Wound Therapy Wound VAC settings at continuous pressure.  Use foam to wound cavity. Please order WHITE foam to fill any tunnel/s and/or undermining when necessary. Change VAC dressing 2 X WEEK. Change canister as indicated when full. Hyperbaric Oxygen Therapy Evaluate for HBO Therapy Indication and location: - osteomylitis left ishium If appropriate for treatment, begin HBOT per protocol: 2.0 ATA for 90 Minutes without A Breaks ir One treatment per day (delivered Monday through Friday unless otherwise specified in Special Instructions below): Total # of Treatments: - 40 A ntihistamine 30 minutes prior to HBO Treatment, difficulty clearing ears. Wound Treatment Wound #1 - Gluteus Wound Laterality: Left Cleanser: Soap and Water (Home Health) 1 x Per Day/30 Days Discharge Instructions: Gently cleanse wound with antibacterial soap, rinse and pat dry prior to dressing wounds LAAIBAH, HOVELL ([id number]) [phone].pdf Page 4 of 7 Prim Dressing: Hydrofera Blue Ready Transfer Foam, 4x5 (in/in) 1 x Per Day/30 Days ary Discharge Instructions: Apply Hydrofera Blue Ready to wound bed as directed Secondary Dressing: (BORDER) Zetuvit Plus SILICONE BORDER Dressing 4x4 (in/in) 1 x Per Day/30 Days Discharge Instructions: Please do not put silicone bordered dressings under wraps. Use non-bordered dressing only. Electronic Signature(s) Signed: 06/08/2023 4:31:22 PM By: [name] PA-C Signed: 06/09/2023 1:15:19 PM By: Yevonne Pax RN Previous Signature: 06/06/2023 12:58:48 PM Version By: Yevonne Pax RN Entered By: Yevonne Pax on 06/06/2023 13:32:49 -------------------------------------------------------------------------------- Problem List Details Patient Name: Date of Service: [place] [name] RE, [name] 06/06/2023 12:30 PM Medical Record Number: [id number] Patient Account Number: [id number] Date of Birth/Sex: Treating RN: 10-Jun-1971 (52 y.o. 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, stage 4 03/21/2023 No Yes G35 Multiple sclerosis 03/21/2023 No Yes Inactive Problems Resolved Problems Electronic Signature(s) Signed: 06/08/2023 1:42:59 PM By: [name] PA-C Previous Signature: 06/06/2023 12:38:06 PM Version By: [name] PA-C Entered By: [name] on 06/08/2023 13:42:59 Klooster, Drucie Opitz ([id number]) [id number] Page 5 of 7 -------------------------------------------------------------------------------- Progress Note Details Patient Name: Date of Service: [name], [name] 06/06/2023 12:30 PM Medical Record Number: [id number] Patient Account Number: [id number] Date of Birth/Sex: Treating RN: [birth year] (52 y.o. [name] Primary Care Provider: [name], [name] Other Clinician: Referring Provider: Treating Provider/Extender: [name] GO [place]DB[place]K, [place] [name] in Treatment: 11 Subjective Chief Complaint Information obtained from Patient Pressure ulcer left gluteal region History of Present Illness (HPI) 03-21-2023 upon evaluation today patient appears for initial evaluation regarding a wound that actually began around 03/07/2023. With that being said she has been having issues here with this area and then subsequently ended up in the hospital due to a high fever. She was discharged from the hospital and then started on oral antibiotics that was on 03-16-2023. The oral antibiotics are doxycycline and Keflex that she is currently taking. With that being said she was diagnosed with multiple sclerosis in 2000 [name] she has been in a well motorized wheelchair since 2014. She is a pleasant individual and unfortunately this is the issue that she is  having currently with a pressure  Health, please order for patient. ir Turn and reposition every 2 hours - Float heels on pillow when in the bed. Additional Orders / Instructions Follow Nutritious Diet and Increase Protein Intake Negative Pressure Wound Therapy Wound VAC settings at continuous pressure.  Use foam to wound cavity. Please order WHITE foam to fill any tunnel/s and/or undermining when necessary. Change VAC dressing 2 X WEEK. Change canister as indicated when full. Hyperbaric Oxygen Therapy Evaluate for HBO Therapy Indication and location: - osteomylitis left ishium If appropriate for treatment, begin HBOT per protocol: 2.0 ATA for 90 Minutes without A Breaks ir One treatment per day (delivered Monday through Friday unless otherwise specified in Special Instructions below): Total # of Treatments: - 40 A ntihistamine 30 minutes prior to HBO Treatment, difficulty clearing ears. Wound Treatment Wound #1 - Gluteus Wound Laterality: Left Cleanser: Soap and Water (Home Health) 1 x Per Day/30 Days Discharge Instructions: Gently cleanse wound with antibacterial soap, rinse and pat dry prior to dressing wounds LAAIBAH, HOVELL ([id number]) [phone].pdf Page 4 of 7 Prim Dressing: Hydrofera Blue Ready Transfer Foam, 4x5 (in/in) 1 x Per Day/30 Days ary Discharge Instructions: Apply Hydrofera Blue Ready to wound bed as directed Secondary Dressing: (BORDER) Zetuvit Plus SILICONE BORDER Dressing 4x4 (in/in) 1 x Per Day/30 Days Discharge Instructions: Please do not put silicone bordered dressings under wraps. Use non-bordered dressing only. Electronic Signature(s) Signed: 06/08/2023 4:31:22 PM By: [name] PA-C Signed: 06/09/2023 1:15:19 PM By: Yevonne Pax RN Previous Signature: 06/06/2023 12:58:48 PM Version By: Yevonne Pax RN Entered By: Yevonne Pax on 06/06/2023 13:32:49 -------------------------------------------------------------------------------- Problem List Details Patient Name: Date of Service: [place] [name] RE, [name] 06/06/2023 12:30 PM Medical Record Number: [id number] Patient Account Number: [id number] Date of Birth/Sex: Treating RN: 10-Jun-1971 (52 y.o. 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, stage 4 03/21/2023 No Yes G35 Multiple sclerosis 03/21/2023 No Yes Inactive Problems Resolved Problems Electronic Signature(s) Signed: 06/08/2023 1:42:59 PM By: [name] PA-C Previous Signature: 06/06/2023 12:38:06 PM Version By: [name] PA-C Entered By: [name] on 06/08/2023 13:42:59 Klooster, Drucie Opitz ([id number]) [id number] Page 5 of 7 -------------------------------------------------------------------------------- Progress Note Details Patient Name: Date of Service: [name], [name] 06/06/2023 12:30 PM Medical Record Number: [id number] Patient Account Number: [id number] Date of Birth/Sex: Treating RN: [birth year] (52 y.o. [name] Primary Care Provider: [name], [name] Other Clinician: Referring Provider: Treating Provider/Extender: [name] GO [place], [place] [name] in Treatment: 11 Subjective Chief Complaint Information obtained from Patient Pressure ulcer left gluteal region History of Present Illness (HPI) 03-21-2023 upon evaluation today patient appears for initial evaluation regarding a wound that actually began around 03/07/2023. With that being said she has been having issues here with this area and then subsequently ended up in the hospital due to a high fever. She was discharged from the hospital and then started on oral antibiotics that was on 03-16-2023. The oral antibiotics are doxycycline and Keflex that she is currently taking. With that being said she was diagnosed with multiple sclerosis in 2000 [name] she has been in a well motorized wheelchair since 2014. She is a pleasant individual and unfortunately this is the issue that she is  having currently with a pressure  TEMPRANCE, BENTANCOURT ([id number]) [phone].pdf Page 1 of 7 Visit Report for 06/06/2023 Chief Complaint Document Details Patient Name: Date of Service: COSSETTE, GATSON 06/06/2023 12:30 PM Medical Record Number: [id number] Patient Account Number: [id number] Date of Birth/Sex: Treating RN: 01-17-1971 (51 y.o. [name] Primary Care Provider: [name], [name] Other Clinician: Referring Provider: Treating Provider/Extender: [name] GO [place]DB[place]K, LA [name] in Treatment: 11 Information Obtained from: Patient Chief Complaint Pressure ulcer left gluteal region Electronic Signature(s) Signed: 06/06/2023 12:38:14 PM By: [name] PA-C Entered By: [name] on 06/06/2023 12:38:14 -------------------------------------------------------------------------------- HPI Details Patient Name: Date of Service: [name], Zakari L. 06/06/2023 12:30 PM Medical Record Number: [id number] Patient Account Number: [id number] Date of Birth/Sex: Treating RN: 11-27-1970 (52 y.o. [name] Primary Care Provider: [name], [name] Other Clinician: Referring Provider: Treating Provider/Extender: [name] GO [place]K, [place] [name] in Treatment: 11 History of Present Illness HPI Description: 03-21-2023 upon evaluation today patient appears for initial evaluation regarding a wound that actually began around 03/07/2023. With that being said she has been having issues here with this area and then subsequently ended up in the hospital due to a high fever. She was discharged from the hospital and then started on oral antibiotics that was on 03-16-2023. The oral antibiotics are doxycycline and Keflex that she is currently taking. With that being said she was diagnosed with multiple sclerosis in 2000 [name] she has been in a well motorized wheelchair since 2014. She is a pleasant individual and unfortunately this is the issue that she is having currently with a pressure ulcer  although this is the good news is it is the first time she has had this happen she has an excellent caregiver that takes great care of her and the family is very attentive. 03-28-2023 upon evaluation today patient appears to be doing worse with regard to the irritation around the edges of her wound. The good news is the wound itself does not appear worse but the irritation is definitely not as good as even what I saw last week. In the interim since last week [name] her aide has actually broken her wrist and is no longer helping with the dressing changes I think this is part of the issue here. Fortunately I do not see any evidence of infection locally or systemically which is good news. 04-04-2023 upon evaluation today patient's wounds are actually showing signs of significant improvement which is great news. Fortunately I do not see any evidence of worsening overall I think that her family is making excellent progress towards getting this closed she is doing a good job offloading we will try to get the air-fluidized bed but that is just not working out her insurance at this point is denying this. She does have an alternating air mattress at home although it has been apparently on static mode I did advise this to be switched to alternating mode. SHALESE, GEDNEY ([id number]) [phone].pdf Page 2 of 7 04-11-2023 upon evaluation today patient appears to be doing better in regard to her wound. She has been tolerating the dressing changes without complication. Fortunately I do not see any signs of active infection locally or systemically at this time. Her wound does have a lot of blue-green drainage which has made concerned about Pseudomonas hanging out of the area. 7/23; improved wound which is a pressure ulcer on the left buttock in a patient with advanced MS. She has a surface on her mattress at  Health, please order for patient. ir Turn and reposition every 2 hours - Float heels on pillow when in the bed. Additional Orders / Instructions Follow Nutritious Diet and Increase Protein Intake Negative Pressure Wound Therapy Wound VAC settings at continuous pressure.  Use foam to wound cavity. Please order WHITE foam to fill any tunnel/s and/or undermining when necessary. Change VAC dressing 2 X WEEK. Change canister as indicated when full. Hyperbaric Oxygen Therapy Evaluate for HBO Therapy Indication and location: - osteomylitis left ishium If appropriate for treatment, begin HBOT per protocol: 2.0 ATA for 90 Minutes without A Breaks ir One treatment per day (delivered Monday through Friday unless otherwise specified in Special Instructions below): Total # of Treatments: - 40 A ntihistamine 30 minutes prior to HBO Treatment, difficulty clearing ears. Wound Treatment Wound #1 - Gluteus Wound Laterality: Left Cleanser: Soap and Water (Home Health) 1 x Per Day/30 Days Discharge Instructions: Gently cleanse wound with antibacterial soap, rinse and pat dry prior to dressing wounds LAAIBAH, HOVELL ([id number]) [phone].pdf Page 4 of 7 Prim Dressing: Hydrofera Blue Ready Transfer Foam, 4x5 (in/in) 1 x Per Day/30 Days ary Discharge Instructions: Apply Hydrofera Blue Ready to wound bed as directed Secondary Dressing: (BORDER) Zetuvit Plus SILICONE BORDER Dressing 4x4 (in/in) 1 x Per Day/30 Days Discharge Instructions: Please do not put silicone bordered dressings under wraps. Use non-bordered dressing only. Electronic Signature(s) Signed: 06/08/2023 4:31:22 PM By: [name] PA-C Signed: 06/09/2023 1:15:19 PM By: Yevonne Pax RN Previous Signature: 06/06/2023 12:58:48 PM Version By: Yevonne Pax RN Entered By: Yevonne Pax on 06/06/2023 13:32:49 -------------------------------------------------------------------------------- Problem List Details Patient Name: Date of Service: [place] [name] RE, [name] 06/06/2023 12:30 PM Medical Record Number: [id number] Patient Account Number: [id number] Date of Birth/Sex: Treating RN: 10-Jun-1971 (52 y.o. 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, stage 4 03/21/2023 No Yes G35 Multiple sclerosis 03/21/2023 No Yes Inactive Problems Resolved Problems Electronic Signature(s) Signed: 06/08/2023 1:42:59 PM By: [name] PA-C Previous Signature: 06/06/2023 12:38:06 PM Version By: [name] PA-C Entered By: [name] on 06/08/2023 13:42:59 Klooster, Drucie Opitz ([id number]) [id number] Page 5 of 7 -------------------------------------------------------------------------------- Progress Note Details Patient Name: Date of Service: [name], [name] 06/06/2023 12:30 PM Medical Record Number: [id number] Patient Account Number: [id number] Date of Birth/Sex: Treating RN: [birth year] (52 y.o. [name] Primary Care Provider: [name], [name] Other Clinician: Referring Provider: Treating Provider/Extender: [name] GO [place]DB[place]K, [place] [name] in Treatment: 11 Subjective Chief Complaint Information obtained from Patient Pressure ulcer left gluteal region History of Present Illness (HPI) 03-21-2023 upon evaluation today patient appears for initial evaluation regarding a wound that actually began around 03/07/2023. With that being said she has been having issues here with this area and then subsequently ended up in the hospital due to a high fever. She was discharged from the hospital and then started on oral antibiotics that was on 03-16-2023. The oral antibiotics are doxycycline and Keflex that she is currently taking. With that being said she was diagnosed with multiple sclerosis in 2000 [name] she has been in a well motorized wheelchair since 2014. She is a pleasant individual and unfortunately this is the issue that she is  having currently with a pressure  to proceed with. I reviewed her EKG, chest x-ray, and CBC and everything appears to be fairly stable at this point which is good news. I think that she is appropriate for hyperbaric oxygen therapy. I am going to go ahead and see about getting approval and the patient is in agreement with the plan. Will see where things stand at follow-up next week if we can get approval we will try to get this started next week. We will see patient back for reevaluation in 1 week here in the clinic. If anything worsens or changes patient will contact our office for additional recommendations. My goal is to try and be aggressive as possible to try to get the wounds closed as quickly as possible. Electronic Signature(s) Signed: 06/08/2023  1:43:09 PM By: [name] PA-C Previous Signature: 06/06/2023 5:12:43 PM Version By: [name] PA-C Entered By: [name] on 06/08/2023 13:43:09 -------------------------------------------------------------------------------- SuperBill Details Patient Name: Date of Service: [place] [name] RE, Nadiya L. 06/06/2023 Medical Record Number: [id number] Patient Account Number: [id number] Date of Birth/Sex: Treating RN: 09-Nov-1970 (52 y.o. 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, stage 4 G35 Multiple sclerosis Facility Procedures : CPT4 Code: 40981191 Description: 47829 - WOUND CARE VISIT-LEV 2 EST PT Modifier: Quantity: 1 Physician Procedures : CPT4 Code Description Modifier [id number] 99214 - WC PHYS LEVEL 4 - EST PT ICD-10 Diagnosis Description 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  TEMPRANCE, BENTANCOURT ([id number]) [phone].pdf Page 1 of 7 Visit Report for 06/06/2023 Chief Complaint Document Details Patient Name: Date of Service: COSSETTE, GATSON 06/06/2023 12:30 PM Medical Record Number: [id number] Patient Account Number: [id number] Date of Birth/Sex: Treating RN: 01-17-1971 (51 y.o. [name] Primary Care Provider: [name], [name] Other Clinician: Referring Provider: Treating Provider/Extender: [name] GO [place]DBECK, [place] [name] in Treatment: 11 Information Obtained from: Patient Chief Complaint Pressure ulcer left gluteal region Electronic Signature(s) Signed: 06/06/2023 12:38:14 PM By: [name] PA-C Entered By: [name] on 06/06/2023 12:38:14 -------------------------------------------------------------------------------- HPI Details Patient Name: Date of Service: [name], Zakari L. 06/06/2023 12:30 PM Medical Record Number: [id number] Patient Account Number: [id number] Date of Birth/Sex: Treating RN: 11-27-1970 (52 y.o. [name] Primary Care Provider: [name], [name] Other Clinician: Referring Provider: Treating Provider/Extender: [name] GO [place]DBECK, [place] [name] in Treatment: 11 History of Present Illness HPI Description: 03-21-2023 upon evaluation today patient appears for initial evaluation regarding a wound that actually began around 03/07/2023. With that being said she has been having issues here with this area and then subsequently ended up in the hospital due to a high fever. She was discharged from the hospital and then started on oral antibiotics that was on 03-16-2023. The oral antibiotics are doxycycline and Keflex that she is currently taking. With that being said she was diagnosed with multiple sclerosis in 2000 [name] she has been in a well motorized wheelchair since 2014. She is a pleasant individual and unfortunately this is the issue that she is having currently with a pressure ulcer  although this is the good news is it is the first time she has had this happen she has an excellent caregiver that takes great care of her and the family is very attentive. 03-28-2023 upon evaluation today patient appears to be doing worse with regard to the irritation around the edges of her wound. The good news is the wound itself does not appear worse but the irritation is definitely not as good as even what I saw last week. In the interim since last week [name] her aide has actually broken her wrist and is no longer helping with the dressing changes I think this is part of the issue here. Fortunately I do not see any evidence of infection locally or systemically which is good news. 04-04-2023 upon evaluation today patient's wounds are actually showing signs of significant improvement which is great news. Fortunately I do not see any evidence of worsening overall I think that her family is making excellent progress towards getting this closed she is doing a good job offloading we will try to get the air-fluidized bed but that is just not working out her insurance at this point is denying this. She does have an alternating air mattress at home although it has been apparently on static mode I did advise this to be switched to alternating mode. SHALESE, GEDNEY ([id number]) [phone].pdf Page 2 of 7 04-11-2023 upon evaluation today patient appears to be doing better in regard to her wound. She has been tolerating the dressing changes without complication. Fortunately I do not see any signs of active infection locally or systemically at this time. Her wound does have a lot of blue-green drainage which has made concerned about Pseudomonas hanging out of the area. 7/23; improved wound which is a pressure ulcer on the left buttock in a patient with advanced MS. She has a surface on her mattress at  to proceed with. I reviewed her EKG, chest x-ray, and CBC and everything appears to be fairly stable at this point which is good news. I think that she is appropriate for hyperbaric oxygen therapy. I am going to go ahead and see about getting approval and the patient is in agreement with the plan. Will see where things stand at follow-up next week if we can get approval we will try to get this started next week. We will see patient back for reevaluation in 1 week here in the clinic. If anything worsens or changes patient will contact our office for additional recommendations. My goal is to try and be aggressive as possible to try to get the wounds closed as quickly as possible. Electronic Signature(s) Signed: 06/08/2023  1:43:09 PM By: [name] PA-C Previous Signature: 06/06/2023 5:12:43 PM Version By: [name] PA-C Entered By: [name] on 06/08/2023 13:43:09 -------------------------------------------------------------------------------- SuperBill Details Patient Name: Date of Service: [place] [name] RE, Nadiya L. 06/06/2023 Medical Record Number: [id number] Patient Account Number: [id number] Date of Birth/Sex: Treating RN: 09-Nov-1970 (52 y.o. 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, stage 4 G35 Multiple sclerosis Facility Procedures : CPT4 Code: [id number] Description: 47829 - WOUND CARE VISIT-LEV 2 EST PT Modifier: Quantity: 1 Physician Procedures : CPT4 Code Description Modifier [id number] - WC PHYS LEVEL 4 - EST PT ICD-10 Diagnosis Description 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 I reviewed her EKG, chest x-ray, and CBC and everything appears to be fairly stable at this point which is good news. I think that she is appropriate for hyperbaric oxygen therapy. I am going to go ahead and see about getting approval and the patient is in agreement with the plan. Will see where things stand at follow-up next week if we can get approval we will try to get this started next week. We will see patient back for reevaluation in 1 week here in the clinic. If anything worsens or changes patient will contact our office for additional recommendations. My goal is to try and be aggressive as possible to try to get the wounds closed as quickly as possible. Electronic Signature(s) Signed: 06/08/2023  1:43:09 PM By: [name] PA-C Previous Signature: 06/06/2023 5:12:43 PM Version By: [name] PA-C Entered By: [name] on 06/08/2023 13:43:09 -------------------------------------------------------------------------------- SuperBill Details Patient Name: Date of Service: [name] RE, Nadiya L. 06/06/2023 Medical Record Number: [id number] Patient Account Number: [id number] Date of Birth/Sex: Treating RN: 09-Nov-1970 (52 y.o. 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, stage 4 G35 Multiple sclerosis Facility Procedures : CPT4 Code: [id number] Description: 47829 - WOUND CARE VISIT-LEV 2 EST PT Modifier: Quantity: 1 Physician Procedures : CPT4 Code Description Modifier [id number] - WC PHYS LEVEL 4 - EST PT ICD-10 Diagnosis Description 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

## 2023-06-08 ENCOUNTER — Ambulatory Visit: Admit: 2023-06-08 | Discharge: 2023-06-09 | Payer: MEDICARE

## 2023-06-08 DIAGNOSIS — G35 Multiple sclerosis: Principal | ICD-10-CM

## 2023-06-08 DIAGNOSIS — M86152 Other acute osteomyelitis, left femur: Principal | ICD-10-CM

## 2023-06-08 DIAGNOSIS — Z452 Encounter for adjustment and management of vascular access device: Principal | ICD-10-CM

## 2023-06-08 DIAGNOSIS — G8220 Paraplegia, unspecified: Principal | ICD-10-CM

## 2023-06-08 DIAGNOSIS — Z09 Encounter for follow-up examination after completed treatment for conditions other than malignant neoplasm: Principal | ICD-10-CM

## 2023-06-08 DIAGNOSIS — E876 Hypokalemia: Principal | ICD-10-CM

## 2023-06-08 MED ORDER — POTASSIUM CHLORIDE ER 20 MEQ TABLET,EXTENDED RELEASE(PART/CRYST)
ORAL_TABLET | Freq: Every day | ORAL | 0 refills | 5 days | Status: CP
Start: 2023-06-08 — End: 2023-06-13

## 2023-06-08 MED ORDER — ARM BRACE
0 refills | 0 days | Status: CN
Start: 2023-06-08 — End: ?

## 2023-06-11 ENCOUNTER — Inpatient Hospital Stay: Admit: 2023-06-11 | Discharge: 2023-06-15 | Payer: MEDICARE

## 2023-06-13 ENCOUNTER — Encounter: Payer: 59 | Admitting: Physician Assistant

## 2023-06-13 DIAGNOSIS — L89324 Pressure ulcer of left buttock, stage 4: Secondary | ICD-10-CM | POA: Diagnosis not present

## 2023-06-13 NOTE — Progress Notes (Addendum)
[name], [name] ([id number]) [phone].pdf Page 1 of 7 Visit Report for 06/13/2023 Chief Complaint Document Details Patient Name: Date of Service: [name], [name] 06/13/2023 9:00 A M Medical Record Number: [id number] Patient Account Number: [id number] Date of Birth/Sex: Treating RN: 1971-05-07 (52 y.o. [name] Primary Care Provider: [name], [name] Other Clinician: [name] Referring Provider: Treating Provider/Extender: [name] [place], [place] [name] in Treatment: 12 Information Obtained from: Patient Chief Complaint Pressure ulcer left gluteal region Electronic Signature(s) Signed: 06/13/2023 9:03:00 AM By: [name] PA-C Entered By: [name] on 06/13/2023 09:03:00 -------------------------------------------------------------------------------- HPI Details Patient Name: Date of Service: [name], [name] 06/13/2023 9:00 A M Medical Record Number: [id number] Patient Account Number: [id number] Date of Birth/Sex: Treating RN: [birth year] (52 y.o. [name] Primary Care Provider: [name], [name] Other Clinician: [name] Referring Provider: Treating Provider/Extender: [name] [place], [place] [name] in Treatment: 12 History of Present Illness HPI Description: 03-21-2023 upon evaluation today patient appears for initial evaluation regarding a wound that actually began around 03/07/2023. With that being said she has been having issues here with this area and then subsequently ended up in the hospital due to a high fever. She was discharged from the hospital and then started on oral antibiotics that was on 03-16-2023. The oral antibiotics are doxycycline and Keflex that she is currently taking. With that being said she was diagnosed with multiple sclerosis in 2000 [name] she has been in a well motorized wheelchair since 2014. She is a pleasant individual and unfortunately this is the issue that she is having  currently with a pressure ulcer although this is the good news is it is the first time she has had this happen she has an excellent caregiver that takes great care of her and the family is very attentive. 03-28-2023 upon evaluation today patient appears to be doing worse with regard to the irritation around the edges of her wound. The good news is the wound itself does not appear worse but the irritation is definitely not as good as even what I saw last week. In the interim since last week [name] her aide has actually broken her wrist and is no longer helping with the dressing changes I think this is part of the issue here. Fortunately I do not see any evidence of infection locally or systemically which is good news. 04-04-2023 upon evaluation today patient's wounds are actually showing signs of significant improvement which is great news. Fortunately I do not see any evidence of worsening overall I think that her family is making excellent progress towards getting this closed she is doing a good job offloading we will try to get the air-fluidized bed but that is just not working out her insurance at this point is denying this. She does have an alternating air mattress at home although it has been apparently on static mode I did advise this to be switched to alternating mode. [name], [name] ([id number]) [phone].pdf Page 2 of 7 04-11-2023 upon evaluation today patient appears to be doing better in regard to her wound. She has been tolerating the dressing changes without complication. Fortunately I do not see any signs of active infection locally or systemically at this time. Her wound does have a lot of blue-green drainage which has made concerned about Pseudomonas hanging out of the area. 7/23; improved wound which is a pressure ulcer on the left buttock in a patient with advanced MS. She has  [name], [name] ([id number]) [phone].pdf Page 1 of 7 Visit Report for 06/13/2023 Chief Complaint Document Details Patient Name: Date of Service: [name], [name] 06/13/2023 9:00 A M Medical Record Number: [id number] Patient Account Number: [id number] Date of Birth/Sex: Treating RN: 1971-05-07 (52 y.o. [name] Primary Care Provider: [name], [name] Other Clinician: [name] Referring Provider: Treating Provider/Extender: [name] [place], [place] [name] in Treatment: 12 Information Obtained from: Patient Chief Complaint Pressure ulcer left gluteal region Electronic Signature(s) Signed: 06/13/2023 9:03:00 AM By: [name] PA-C Entered By: [name] on 06/13/2023 09:03:00 -------------------------------------------------------------------------------- HPI Details Patient Name: Date of Service: [name], [name] 06/13/2023 9:00 A M Medical Record Number: [id number] Patient Account Number: [id number] Date of Birth/Sex: Treating RN: [birth year] (52 y.o. [name] Primary Care Provider: [name], [name] Other Clinician: [name] Referring Provider: Treating Provider/Extender: [name] [place], [place] [name] in Treatment: 12 History of Present Illness HPI Description: 03-21-2023 upon evaluation today patient appears for initial evaluation regarding a wound that actually began around 03/07/2023. With that being said she has been having issues here with this area and then subsequently ended up in the hospital due to a high fever. She was discharged from the hospital and then started on oral antibiotics that was on 03-16-2023. The oral antibiotics are doxycycline and Keflex that she is currently taking. With that being said she was diagnosed with multiple sclerosis in 2000 [name] she has been in a well motorized wheelchair since 2014. She is a pleasant individual and unfortunately this is the issue that she is having  currently with a pressure ulcer although this is the good news is it is the first time she has had this happen she has an excellent caregiver that takes great care of her and the family is very attentive. 03-28-2023 upon evaluation today patient appears to be doing worse with regard to the irritation around the edges of her wound. The good news is the wound itself does not appear worse but the irritation is definitely not as good as even what I saw last week. In the interim since last week [name] her aide has actually broken her wrist and is no longer helping with the dressing changes I think this is part of the issue here. Fortunately I do not see any evidence of infection locally or systemically which is good news. 04-04-2023 upon evaluation today patient's wounds are actually showing signs of significant improvement which is great news. Fortunately I do not see any evidence of worsening overall I think that her family is making excellent progress towards getting this closed she is doing a good job offloading we will try to get the air-fluidized bed but that is just not working out her insurance at this point is denying this. She does have an alternating air mattress at home although it has been apparently on static mode I did advise this to be switched to alternating mode. [name], [name] ([id number]) [phone].pdf Page 2 of 7 04-11-2023 upon evaluation today patient appears to be doing better in regard to her wound. She has been tolerating the dressing changes without complication. Fortunately I do not see any signs of active infection locally or systemically at this time. Her wound does have a lot of blue-green drainage which has made concerned about Pseudomonas hanging out of the area. 7/23; improved wound which is a pressure ulcer on the left buttock in a patient with advanced MS. She has  without Air Breaks One treatment per day (delivered Monday through Friday unless otherwise specified in Special Instructions below): T # of Treatments: - [address] Zanylah L ([id number]) [phone].pdf Page 7 of 7 Antihistamine 30 minutes prior to HBO Treatment, difficulty clearing ears. WOUND #1: - Gluteus Wound Laterality: Left Cleanser: Soap and Water (Home Health) 1 x Per Day/30 Days Discharge Instructions: Gently cleanse wound with antibacterial soap, rinse and pat dry prior to dressing wounds Prim Dressing: Prisma 4.34 (in) 1 x Per Day/30 Days ary Discharge Instructions: tuck into undermined area dry 1. I would recommend that we have the patient continue to monitor for any signs of infection or worsening. Based on what I am seeing I think the wound VAC is gena do well to help get this to feeling. 2. I am good recommend as well that the patient should  continue with the wound care measures as before. Will go using the Prisma in the base of the wound and then subsequently we are going to go ahead and initiate the wound VAC which was ordered but again today is the initiation of that. My hope is that this will allow the area to granulate in faster and get this closed as quickly as possible. We will see patient back for reevaluation in 1 week here in the clinic. If anything worsens or changes patient will contact our office for additional recommendations. Electronic Signature(s) Signed: 06/13/2023 11:07:02 AM By: [name] PA-C Entered By: [name] on 06/13/2023 11:07:02 -------------------------------------------------------------------------------- SuperBill Details Patient Name: Date of Service: [name], Jamile L. 06/13/2023 Medical Record Number: [id number] Patient Account Number: [id number] Date of Birth/Sex: Treating RN: 05/04/1971 (52 y.o. 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, stage 4 G35 Multiple sclerosis Facility Procedures : CPT4 Code: 07371062 Description: 69485 - WOUND VAC-50 SQ CM OR LESS Modifier: Quantity: 1 Physician Procedures : CPT4 Code Description Modifier [id number] 99214 - WC PHYS LEVEL 4 - EST PT ICD-10 Diagnosis Description 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, 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 WOUND #1: - Gluteus Wound Laterality: Left Cleanser: Soap and Water (Home Health) 1 x Per Day/30 Days Discharge Instructions: Gently cleanse wound with antibacterial soap, rinse and pat dry prior to dressing wounds Prim Dressing: Prisma 4.34 (in) 1 x Per Day/30 Days ary Discharge Instructions: tuck into undermined area dry 1. I would recommend that we have the patient continue to monitor for any signs of infection or worsening. Based on what I am seeing I think the wound VAC is gena do well to help get this to feeling. 2. I am good recommend as well that the patient should  continue with the wound care measures as before. Will go using the Prisma in the base of the wound and then subsequently we are going to go ahead and initiate the wound VAC which was ordered but again today is the initiation of that. My hope is that this will allow the area to granulate in faster and get this closed as quickly as possible. We will see patient back for reevaluation in 1 week here in the clinic. If anything worsens or changes patient will contact our office for additional recommendations. Electronic Signature(s) Signed: 06/13/2023 11:07:02 AM By: [name] PA-C Entered By: [name] on 06/13/2023 11:07:02 -------------------------------------------------------------------------------- SuperBill Details Patient Name: Date of Service: [name], Jamile L. 06/13/2023 Medical Record Number: [id number] Patient Account Number: [id number] Date of Birth/Sex: Treating RN: 05/04/1971 (52 y.o. 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, stage 4 G35 Multiple sclerosis Facility Procedures : CPT4 Code: 07371062 Description: 69485 - WOUND VAC-50 SQ CM OR LESS Modifier: Quantity: 1 Physician Procedures : CPT4 Code Description Modifier [id number] 99214 - WC PHYS LEVEL 4 - EST PT ICD-10 Diagnosis Description 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, 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, stage 4 G35 Multiple sclerosis Quantity: 1 Electronic Signature(s) Signed: 06/13/2023 5:02:15 PM By: [name] Signed: 06/13/2023 5:07:51 PM By: [name] PA-C Previous Signature: 06/13/2023 11:07:20 AM Version By: [name] PA-C Entered By: [name] on 06/13/2023 11:18:54  [name], [name] ([id number]) [phone].pdf Page 1 of 7 Visit Report for 06/13/2023 Chief Complaint Document Details Patient Name: Date of Service: [name], [name] 06/13/2023 9:00 A M Medical Record Number: [id number] Patient Account Number: [id number] Date of Birth/Sex: Treating RN: 1971-05-07 (52 y.o. [name] Primary Care Provider: [name], [name] Other Clinician: [name] Referring Provider: Treating Provider/Extender: [name] [place], [place] [name] in Treatment: 12 Information Obtained from: Patient Chief Complaint Pressure ulcer left gluteal region Electronic Signature(s) Signed: 06/13/2023 9:03:00 AM By: [name] PA-C Entered By: [name] on 06/13/2023 09:03:00 -------------------------------------------------------------------------------- HPI Details Patient Name: Date of Service: [name], [name] 06/13/2023 9:00 A M Medical Record Number: [id number] Patient Account Number: [id number] Date of Birth/Sex: Treating RN: [birth year] (52 y.o. [name] Primary Care Provider: [name], [name] Other Clinician: [name] Referring Provider: Treating Provider/Extender: [name] [place], [place] [name] in Treatment: 12 History of Present Illness HPI Description: 03-21-2023 upon evaluation today patient appears for initial evaluation regarding a wound that actually began around 03/07/2023. With that being said she has been having issues here with this area and then subsequently ended up in the hospital due to a high fever. She was discharged from the hospital and then started on oral antibiotics that was on 03-16-2023. The oral antibiotics are doxycycline and Keflex that she is currently taking. With that being said she was diagnosed with multiple sclerosis in 2000 [name] she has been in a well motorized wheelchair since 2014. She is a pleasant individual and unfortunately this is the issue that she is having  currently with a pressure ulcer although this is the good news is it is the first time she has had this happen she has an excellent caregiver that takes great care of her and the family is very attentive. 03-28-2023 upon evaluation today patient appears to be doing worse with regard to the irritation around the edges of her wound. The good news is the wound itself does not appear worse but the irritation is definitely not as good as even what I saw last week. In the interim since last week [name] her aide has actually broken her wrist and is no longer helping with the dressing changes I think this is part of the issue here. Fortunately I do not see any evidence of infection locally or systemically which is good news. 04-04-2023 upon evaluation today patient's wounds are actually showing signs of significant improvement which is great news. Fortunately I do not see any evidence of worsening overall I think that her family is making excellent progress towards getting this closed she is doing a good job offloading we will try to get the air-fluidized bed but that is just not working out her insurance at this point is denying this. She does have an alternating air mattress at home although it has been apparently on static mode I did advise this to be switched to alternating mode. [name], [name] ([id number]) [phone].pdf Page 2 of 7 04-11-2023 upon evaluation today patient appears to be doing better in regard to her wound. She has been tolerating the dressing changes without complication. Fortunately I do not see any signs of active infection locally or systemically at this time. Her wound does have a lot of blue-green drainage which has made concerned about Pseudomonas hanging out of the area. 7/23; improved wound which is a pressure ulcer on the left buttock in a patient with advanced MS. She has  visit that day. - Wednesday, Friday **Please direct any NON-WOUND related issues/requests for orders to patient's Primary Care Physician. **If current dressing causes regression in wound condition, may D/C ordered dressing product/s and apply Normal Saline Moist Dressing daily until next Wound Healing Center or Other MD appointment. **Notify Wound Healing Center of regression in wound condition at  [phone]. Bathing/ [employer] wounds with antibacterial soap and water. Off-Loading Roho cushion for wheelchair Hospital bed/mattress A fluidized (Group 3) - Home Health, please order for patient. ir Turn and reposition every 2 hours - Float heels on pillow when in the bed. Additional Orders / Instructions Follow Nutritious Diet and Increase Protein Intake Negative Pressure Wound Therapy Wound VAC settings at continuous pressure. Use foam to wound cavity. Please order WHITE foam to fill any tunnel/s and/or undermining when necessary. Change VAC dressing 2 X WEEK. Change canister as indicated when full. - Home Health to change wound vac 2 x week ( Wednesday and Fridays), then patient to see wound center on Mondays Place Duoderm to windowpane around wound Other: - Place Prisma into wound dry under wound vac. Cut black foam into inverted mushroom so that the mushroom top fills in the undermined area at 11-1, Stem is out and visible. Then bridge to left flank hip Hyperbaric Oxygen Therapy Evaluate for HBO Therapy Indication and location: - osteomylitis left ishium If appropriate for treatment, begin HBOT per protocol: 2.0 ATA for 90 Minutes without A Breaks ir One treatment per day (delivered Monday through Friday unless otherwise specified in Special Instructions below): Total # of Treatments: - 40 A ntihistamine 30 minutes prior to HBO Treatment, difficulty clearing ears. [name], [name] ([id number]) [phone].pdf Page 4 of 7 Wound Treatment Wound #1 - Gluteus Wound Laterality: Left Cleanser: Soap and Water (Home Health) 1 x Per Day/30 Days Discharge Instructions: Gently cleanse wound with antibacterial soap, rinse and pat dry prior to dressing wounds Prim Dressing: Prisma 4.34 (in) 1 x Per Day/30 Days ary Discharge Instructions: tuck into undermined area dry under wound vac Electronic Signature(s) Signed: 06/19/2023 5:11:42 PM By:  [name] Signed: 06/26/2023 4:50:01 PM By: [name] PA-C Previous Signature: 06/13/2023 5:02:15 PM Version By: [name] Previous Signature: 06/13/2023 5:07:51 PM Version By: [name] PA-C Entered By: [name] on 06/19/2023 17:08:26 -------------------------------------------------------------------------------- Problem List Details Patient Name: Date of Service: [name], [name] 06/13/2023 9:00 A M Medical Record Number: [id number] Patient Account Number: [id number] Date of Birth/Sex: Treating RN: 1971-01-14 (52 y.o. 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, stage 4 03/21/2023 No Yes G35 Multiple sclerosis 03/21/2023 No Yes Inactive Problems Resolved Problems Electronic Signature(s) Signed: 06/13/2023 9:02:57 AM By: [name] PA-C Entered By: [name] on 06/13/2023 09:02:56 [name] ([id number]) [id number] Page 5 of 7 -------------------------------------------------------------------------------- Progress Note Details Patient Name: Date of Service: [name], [name] 06/13/2023 9:00 A M Medical Record Number: [id number] Patient Account Number: [id number] Date of Birth/Sex: Treating RN: 1971-02-06 (52 y.o. [name] Primary Care Provider: [name], [name] Other Clinician: [name] Referring Provider: Treating Provider/Extender: [name] [place], [place] [name] in Treatment: 12 Subjective Chief Complaint Information obtained from Patient Pressure ulcer left gluteal region History  visit that day. - Wednesday, Friday **Please direct any NON-WOUND related issues/requests for orders to patient's Primary Care Physician. **If current dressing causes regression in wound condition, may D/C ordered dressing product/s and apply Normal Saline Moist Dressing daily until next Wound Healing Center or Other MD appointment. **Notify Wound Healing Center of regression in wound condition at  [phone]. Bathing/ [employer] wounds with antibacterial soap and water. Off-Loading Roho cushion for wheelchair Hospital bed/mattress A fluidized (Group 3) - Home Health, please order for patient. ir Turn and reposition every 2 hours - Float heels on pillow when in the bed. Additional Orders / Instructions Follow Nutritious Diet and Increase Protein Intake Negative Pressure Wound Therapy Wound VAC settings at continuous pressure. Use foam to wound cavity. Please order WHITE foam to fill any tunnel/s and/or undermining when necessary. Change VAC dressing 2 X WEEK. Change canister as indicated when full. - Home Health to change wound vac 2 x week ( Wednesday and Fridays), then patient to see wound center on Mondays Place Duoderm to windowpane around wound Other: - Place Prisma into wound dry under wound vac. Cut black foam into inverted mushroom so that the mushroom top fills in the undermined area at 11-1, Stem is out and visible. Then bridge to left flank hip Hyperbaric Oxygen Therapy Evaluate for HBO Therapy Indication and location: - osteomylitis left ishium If appropriate for treatment, begin HBOT per protocol: 2.0 ATA for 90 Minutes without A Breaks ir One treatment per day (delivered Monday through Friday unless otherwise specified in Special Instructions below): Total # of Treatments: - 40 A ntihistamine 30 minutes prior to HBO Treatment, difficulty clearing ears. [name], [name] ([id number]) [phone].pdf Page 4 of 7 Wound Treatment Wound #1 - Gluteus Wound Laterality: Left Cleanser: Soap and Water (Home Health) 1 x Per Day/30 Days Discharge Instructions: Gently cleanse wound with antibacterial soap, rinse and pat dry prior to dressing wounds Prim Dressing: Prisma 4.34 (in) 1 x Per Day/30 Days ary Discharge Instructions: tuck into undermined area dry under wound vac Electronic Signature(s) Signed: 06/19/2023 5:11:42 PM By:  [name] Signed: 06/26/2023 4:50:01 PM By: [name] PA-C Previous Signature: 06/13/2023 5:02:15 PM Version By: [name] Previous Signature: 06/13/2023 5:07:51 PM Version By: [name] PA-C Entered By: [name] on 06/19/2023 17:08:26 -------------------------------------------------------------------------------- Problem List Details Patient Name: Date of Service: [name], [name] 06/13/2023 9:00 A M Medical Record Number: [id number] Patient Account Number: [id number] Date of Birth/Sex: Treating RN: 1971-01-14 (52 y.o. 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, stage 4 03/21/2023 No Yes G35 Multiple sclerosis 03/21/2023 No Yes Inactive Problems Resolved Problems Electronic Signature(s) Signed: 06/13/2023 9:02:57 AM By: [name] PA-C Entered By: [name] on 06/13/2023 09:02:56 [name] ([id number]) [id number] Page 5 of 7 -------------------------------------------------------------------------------- Progress Note Details Patient Name: Date of Service: [name], [name] 06/13/2023 9:00 A M Medical Record Number: [id number] Patient Account Number: [id number] Date of Birth/Sex: Treating RN: 1971-02-06 (52 y.o. [name] Primary Care Provider: [name], [name] Other Clinician: [name] Referring Provider: Treating Provider/Extender: [name] [place] [name] in Treatment: 12 Subjective Chief Complaint Information obtained from Patient Pressure ulcer left gluteal region History  discussed the possibility of a wound VAC although I have not made that decision as of yet. Patient does have a follow-up appointment infectious disease on June 01, 2023 this will be a video visit. 06-06-2023 upon evaluation today patient appears to be doing about the same in regard to her wound. Fortunately I do not see any evidence of worsening overall. I do believe that she is tolerating the dressing changes without complication. The good news is that she really does seem to be  stable and I feel like the infection is getting under better control which is good news. The wound itself though is really unchanged as far as depth is concerned this is what has me most concerned at this point. I think that we may want to consider a wound VAC and to be honest I think hyperbaric oxygen therapy would also be a potential option here for her if we can gain approval. 06-13-2023 upon evaluation today patient appears to be doing well currently in regard to her wound which is actually showing some signs of improvement this is good news. Fortunately I do not see any evidence of worsening I think that the depth is improving and I think that the wound VAC may help to get this to feeling much more rapidly which will be awesome as well. Overall I am extremely pleased with where we stand. Electronic Signature(s) Signed: 06/13/2023 11:05:38 AM By: [name] PA-C Entered By: [name] on 06/13/2023 11:05:37 -------------------------------------------------------------------------------- Physical Exam Details Patient Name: Date of Service: [name], [name]. 06/13/2023 9:00 A M Medical Record Number: [id number] Patient Account Number: [id number] Date of Birth/Sex: Treating RN: 12-24-1970 (52 y.o. [name] Primary Care Provider: [name], [name] Other Clinician: [name] Referring Provider: Treating Provider/Extender: [name] [place] [name] in Treatment: 12 Constitutional Well-nourished and well-hydrated in no acute distress. Respiratory normal breathing without difficulty. Psychiatric this patient is able to make decisions and demonstrates good insight into disease process. Alert and Oriented x 3. pleasant and cooperative. Notes Upon inspection patient's wound bed actually showed signs of good granulation and epithelization at this point. Fortunately I do not see any signs of worsening PRAGYA, LOFASO ([id number]) [phone].pdf  Page 3 of 7 overall and I believe that the patient is making excellent headway towards complete closure which is great news. I think the wound VAC which way to get started today should help to speed this up as well. Electronic Signature(s) Signed: 06/13/2023 11:06:26 AM By: [name] PA-C Entered By: [name] on 06/13/2023 11:06:25 -------------------------------------------------------------------------------- Physician Orders Details Patient Name: Date of Service: [name], Mikhala L. 06/13/2023 9:00 A M Medical Record Number: [id number] Patient Account Number: [id number] Date of Birth/Sex: Treating RN: 02-22-1971 (52 y.o. 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 Home Health Home Health Company: - [facility] fax: [phone] [facility] for wound care. May utilize formulary equivalent dressing for wound treatment orders unless otherwise specified. Home Health Nurse may visit PRN to address patients wound care needs. Scheduled days for dressing changes to be completed; exception, patient has scheduled wound care

## 2023-06-13 NOTE — Progress Notes (Addendum)
0 Ankle / Brachial Index (ABI) - do not check if billed separately Has the patient been seen at the hospital within the last three years: Yes Total Score: 0 Level Of Care: ____ Electronic Signature(s) Signed: 06/13/2023 5:02:15 PM By: [name] Entered By: [name] on 06/13/2023 08:18:39 -------------------------------------------------------------------------------- Encounter Discharge Information Details Patient Name: Date of Service: [name], [name]. 06/13/2023 9:00 A M Medical Record Number: [id number] Patient Account Number: [id number] Date of Birth/Sex: Treating RN: August 25, 1971 (52 y.o. [name] Primary Care Buffie Herne: [name], [name] Other Clinician: [name] Referring Haset Oaxaca: Treating Gilbert Manolis/Extender: [name] [place], [place] Ladon Applebaum, [place] Pratt ([id number]) [id number] Page 3 of 9 Weeks in Treatment: 12 Encounter Discharge Information Items Discharge Condition: Stable Ambulatory Status: Wheelchair Discharge Destination: Home Transportation: Private Auto Accompanied By: niece Schedule Follow-up Appointment: Yes Clinical Summary of Care: Electronic Signature(s) Signed: 06/13/2023 5:02:15 PM By: [name] Entered By: [name] on 06/13/2023 08:20:49 -------------------------------------------------------------------------------- Lower Extremity Assessment Details Patient Name: Date of Service: [name], [name]. 06/13/2023 9:00 A M Medical Record Number: [id number] Patient Account Number: [id number] Date of Birth/Sex: Treating RN: 05-17-1971 (52 y.o. [name] Primary Care Kyrstal Monterrosa: [name], [name] Other Clinician: [name] Referring Helix Lafontaine: Treating Aniken Monestime/Extender: [name] [place] [name] in Treatment: 12 Electronic Signature(s) Signed:  06/13/2023 5:02:15 PM By: [name] Signed: 06/14/2023 5:31:20 PM By: Midge Aver MSN RN CNS WTA Entered By: [name] on 06/13/2023 06:43:06 -------------------------------------------------------------------------------- Multi Wound Chart Details Patient Name: Date of Service: [name], [name]. 06/13/2023 9:00 A M Medical Record Number: [id number] Patient Account Number: [id number] Date of Birth/Sex: Treating RN: 1971-08-10 (52 y.o. [name] Primary Care Yamina Lenis: [name], [name] Other Clinician: [name] Referring Tamika Shropshire: Treating Smith Mcnicholas/Extender: [name] [place] [name] in Treatment: 12 Vital Signs Height(in): 64 Pulse(bpm): 89 Weight(lbs): 166 Blood Pressure(mmHg): 139/86 Body Mass Index(BMI): 28.5 Temperature(F): 98.2 Respiratory Rate(breaths/min): 18 [[name], [name]:] [[id number].pdf Page 4 of 9:1 N/A N/A N/A N/A] Left Gluteus N/A N/A Wound Location: Blister N/A N/A Wounding Event: Pressure Ulcer N/A N/A Primary Etiology: Neuropathy N/A N/A Comorbid History: 03/07/2023 N/A N/A Date Acquired: 12 N/A N/A Weeks of Treatment: Open N/A N/A Wound Status: No N/A N/A Wound Recurrence: 0.5x0.5x1.3 N/A N/A Measurements Pratt x W x D (cm) 0.196 N/A N/A A (cm) : rea 0.255 N/A N/A Volume (cm) : 86.10% N/A N/A % Reduction in A rea: 91.40% N/A N/A % Reduction in Volume: Category/Stage IV N/A N/A Classification: Medium N/A N/A Exudate A mount: Serosanguineous N/A N/A Exudate Type: red, brown N/A N/A Exudate Color: Large (67-100%) N/A N/A Granulation A mount: Red N/A N/A Granulation Quality: None Present (0%) N/A N/A Necrotic A mount: Fat Layer (Subcutaneous Tissue): Yes N/A N/A Exposed Structures: Fascia: No Tendon: No Muscle: No Joint: No Bone: No None N/A N/A Epithelialization: Treatment Notes Electronic Signature(s) Signed: 06/13/2023 5:02:15 PM By: [name] Entered By: [name] on 06/13/2023 06:43:10 -------------------------------------------------------------------------------- Multi-Disciplinary Care Plan Details Patient Name: Date of Service: [name], [name]. 06/13/2023 9:00 A M Medical Record Number: [id number] Patient Account Number: [id number] Date of Birth/Sex: Treating RN: Jun 14, 1971 (52 y.o. [name] Primary Care Mayari Matus: [name], [name] Other Clinician: [name] Referring Kamia Insalaco: Treating Selicia Windom/Extender: [name] [place] [name] in Treatment: 12 Active Inactive Wound/Skin Impairment Nursing Diagnoses: Impaired tissue integrity Knowledge deficit related to smoking impact on wound healing Knowledge deficit  0 Ankle / Brachial Index (ABI) - do not check if billed separately Has the patient been seen at the hospital within the last three years: Yes Total Score: 0 Level Of Care: ____ Electronic Signature(s) Signed: 06/13/2023 5:02:15 PM By: [name] Entered By: [name] on 06/13/2023 08:18:39 -------------------------------------------------------------------------------- Encounter Discharge Information Details Patient Name: Date of Service: [name], [name]. 06/13/2023 9:00 A M Medical Record Number: [id number] Patient Account Number: [id number] Date of Birth/Sex: Treating RN: August 25, 1971 (52 y.o. [name] Primary Care Buffie Herne: [name], [name] Other Clinician: [name] Referring Haset Oaxaca: Treating Gilbert Manolis/Extender: [name] [place], [place] Ladon Applebaum, [place] Pratt ([id number]) [id number] Page 3 of 9 Weeks in Treatment: 12 Encounter Discharge Information Items Discharge Condition: Stable Ambulatory Status: Wheelchair Discharge Destination: Home Transportation: Private Auto Accompanied By: niece Schedule Follow-up Appointment: Yes Clinical Summary of Care: Electronic Signature(s) Signed: 06/13/2023 5:02:15 PM By: [name] Entered By: [name] on 06/13/2023 08:20:49 -------------------------------------------------------------------------------- Lower Extremity Assessment Details Patient Name: Date of Service: [name], [name]. 06/13/2023 9:00 A M Medical Record Number: [id number] Patient Account Number: [id number] Date of Birth/Sex: Treating RN: 05-17-1971 (52 y.o. [name] Primary Care Kyrstal Monterrosa: [name], [name] Other Clinician: [name] Referring Helix Lafontaine: Treating Aniken Monestime/Extender: [name] [place], [place] [name] in Treatment: 12 Electronic Signature(s) Signed:  06/13/2023 5:02:15 PM By: [name] Signed: 06/14/2023 5:31:20 PM By: Midge Aver MSN RN CNS WTA Entered By: [name] on 06/13/2023 06:43:06 -------------------------------------------------------------------------------- Multi Wound Chart Details Patient Name: Date of Service: [name], [name]. 06/13/2023 9:00 A M Medical Record Number: [id number] Patient Account Number: [id number] Date of Birth/Sex: Treating RN: 1971-08-10 (52 y.o. [name] Primary Care Yamina Lenis: [name], [name] Other Clinician: [name] Referring Tamika Shropshire: Treating Smith Mcnicholas/Extender: [name] [place], [place] [name] in Treatment: 12 Vital Signs Height(in): 64 Pulse(bpm): 89 Weight(lbs): 166 Blood Pressure(mmHg): 139/86 Body Mass Index(BMI): 28.5 Temperature(F): 98.2 Respiratory Rate(breaths/min): 18 [[name], [name]:] [[id number].pdf Page 4 of 9:1 N/A N/A N/A N/A] Left Gluteus N/A N/A Wound Location: Blister N/A N/A Wounding Event: Pressure Ulcer N/A N/A Primary Etiology: Neuropathy N/A N/A Comorbid History: 03/07/2023 N/A N/A Date Acquired: 12 N/A N/A Weeks of Treatment: Open N/A N/A Wound Status: No N/A N/A Wound Recurrence: 0.5x0.5x1.3 N/A N/A Measurements Pratt x W x D (cm) 0.196 N/A N/A A (cm) : rea 0.255 N/A N/A Volume (cm) : 86.10% N/A N/A % Reduction in A rea: 91.40% N/A N/A % Reduction in Volume: Category/Stage IV N/A N/A Classification: Medium N/A N/A Exudate A mount: Serosanguineous N/A N/A Exudate Type: red, brown N/A N/A Exudate Color: Large (67-100%) N/A N/A Granulation A mount: Red N/A N/A Granulation Quality: None Present (0%) N/A N/A Necrotic A mount: Fat Layer (Subcutaneous Tissue): Yes N/A N/A Exposed Structures: Fascia: No Tendon: No Muscle: No Joint: No Bone: No None N/A N/A Epithelialization: Treatment Notes Electronic Signature(s) Signed: 06/13/2023 5:02:15 PM By: [name] Entered By: [name] on 06/13/2023 06:43:10 -------------------------------------------------------------------------------- Multi-Disciplinary Care Plan Details Patient Name: Date of Service: [name], [name]. 06/13/2023 9:00 A M Medical Record Number: [id number] Patient Account Number: [id number] Date of Birth/Sex: Treating RN: Jun 14, 1971 (52 y.o. [name] Primary Care Mayari Matus: [name], [name] Other Clinician: [name] Referring Kamia Insalaco: Treating Selicia Windom/Extender: [name] [place], [place] [name] in Treatment: 12 Active Inactive Wound/Skin Impairment Nursing Diagnoses: Impaired tissue integrity Knowledge deficit related to smoking impact on wound healing Knowledge deficit  0 Ankle / Brachial Index (ABI) - do not check if billed separately Has the patient been seen at the hospital within the last three years: Yes Total Score: 0 Level Of Care: ____ Electronic Signature(s) Signed: 06/13/2023 5:02:15 PM By: [name] Entered By: [name] on 06/13/2023 08:18:39 -------------------------------------------------------------------------------- Encounter Discharge Information Details Patient Name: Date of Service: [name], [name]. 06/13/2023 9:00 A M Medical Record Number: [id number] Patient Account Number: [id number] Date of Birth/Sex: Treating RN: August 25, 1971 (52 y.o. [name] Primary Care Buffie Herne: [name], [name] Other Clinician: [name] Referring Haset Oaxaca: Treating Gilbert Manolis/Extender: [name] [place], [place] Ladon Applebaum, [place] Pratt ([id number]) [id number] Page 3 of 9 Weeks in Treatment: 12 Encounter Discharge Information Items Discharge Condition: Stable Ambulatory Status: Wheelchair Discharge Destination: Home Transportation: Private Auto Accompanied By: niece Schedule Follow-up Appointment: Yes Clinical Summary of Care: Electronic Signature(s) Signed: 06/13/2023 5:02:15 PM By: [name] Entered By: [name] on 06/13/2023 08:20:49 -------------------------------------------------------------------------------- Lower Extremity Assessment Details Patient Name: Date of Service: [name], [name]. 06/13/2023 9:00 A M Medical Record Number: [id number] Patient Account Number: [id number] Date of Birth/Sex: Treating RN: 05-17-1971 (52 y.o. [name] Primary Care Kyrstal Monterrosa: [name], [name] Other Clinician: [name] Referring Helix Lafontaine: Treating Aniken Monestime/Extender: [name] [place], [place] [name] in Treatment: 12 Electronic Signature(s) Signed:  06/13/2023 5:02:15 PM By: [name] Signed: 06/14/2023 5:31:20 PM By: Midge Aver MSN RN CNS WTA Entered By: [name] on 06/13/2023 06:43:06 -------------------------------------------------------------------------------- Multi Wound Chart Details Patient Name: Date of Service: [name], [name]. 06/13/2023 9:00 A M Medical Record Number: [id number] Patient Account Number: [id number] Date of Birth/Sex: Treating RN: 1971-08-10 (52 y.o. [name] Primary Care Yamina Lenis: [name], [name] Other Clinician: [name] Referring Tamika Shropshire: Treating Smith Mcnicholas/Extender: [name] [place], [place] [name] in Treatment: 12 Vital Signs Height(in): 64 Pulse(bpm): 89 Weight(lbs): 166 Blood Pressure(mmHg): 139/86 Body Mass Index(BMI): 28.5 Temperature(F): 98.2 Respiratory Rate(breaths/min): 18 [[name], [name]:] [[id number].pdf Page 4 of 9:1 N/A N/A N/A N/A] Left Gluteus N/A N/A Wound Location: Blister N/A N/A Wounding Event: Pressure Ulcer N/A N/A Primary Etiology: Neuropathy N/A N/A Comorbid History: 03/07/2023 N/A N/A Date Acquired: 12 N/A N/A Weeks of Treatment: Open N/A N/A Wound Status: No N/A N/A Wound Recurrence: 0.5x0.5x1.3 N/A N/A Measurements Pratt x W x D (cm) 0.196 N/A N/A A (cm) : rea 0.255 N/A N/A Volume (cm) : 86.10% N/A N/A % Reduction in A rea: 91.40% N/A N/A % Reduction in Volume: Category/Stage IV N/A N/A Classification: Medium N/A N/A Exudate A mount: Serosanguineous N/A N/A Exudate Type: red, brown N/A N/A Exudate Color: Large (67-100%) N/A N/A Granulation A mount: Red N/A N/A Granulation Quality: None Present (0%) N/A N/A Necrotic A mount: Fat Layer (Subcutaneous Tissue): Yes N/A N/A Exposed Structures: Fascia: No Tendon: No Muscle: No Joint: No Bone: No None N/A N/A Epithelialization: Treatment Notes Electronic Signature(s) Signed: 06/13/2023 5:02:15 PM By: [name] Entered By: [name] on 06/13/2023 06:43:10 -------------------------------------------------------------------------------- Multi-Disciplinary Care Plan Details Patient Name: Date of Service: [name], [name]. 06/13/2023 9:00 A M Medical Record Number: [id number] Patient Account Number: [id number] Date of Birth/Sex: Treating RN: Jun 14, 1971 (52 y.o. [name] Primary Care Mayari Matus: [name], [name] Other Clinician: [name] Referring Kamia Insalaco: Treating Selicia Windom/Extender: [name] [place], [place] [name] in Treatment: 12 Active Inactive Wound/Skin Impairment Nursing Diagnoses: Impaired tissue integrity Knowledge deficit related to smoking impact on wound healing Knowledge deficit  Medical Record Number: [id number] Patient Account Number: [id number] Date of Birth/Sex: Treating RN: 09/22/1971 (52 y.o. [name] Primary Care Amarya Kuehl: [name], [name] Other Clinician: [name] Referring Sylvio Weatherall: Treating [name]/Extender: [name] [place], [place] [name] in Treatment: 12 Wound Status Wound Number:  1 Primary Etiology: Pressure Ulcer Wound Location: Left Gluteus Wound Status: Open Wounding Event: Blister Comorbid History: Neuropathy Date Acquired: 03/07/2023 Weeks Of Treatment: 12 Clustered Wound: No Photos Wound Measurements Length: (cm) 0.5 Width: (cm) 0.5 Depth: (cm) 1.3 Area: (cm) 0.1 Volume: (cm) 0.2 [name], [name] ([id number]) % Reduction in Area: 86.1% % Reduction in Volume: 91.4% Epithelialization: None 96 Tunneling: No 55 Undermining: Yes Starting Position (o'clock): 11 [id number] Page 8 of 9 Ending Position (o'clock): 1 Maximum Distance: (cm) 4.3 Wound Description Classification: Category/Stage IV Exudate Amount: Medium Exudate Type: Serosanguineous Exudate Color: red, brown Foul Odor After Cleansing: No Slough/Fibrino No Wound Bed Granulation Amount: Large (67-100%) Exposed Structure Granulation Quality: Red Fascia Exposed: No Necrotic Amount: None Present (0%) Fat Layer (Subcutaneous Tissue) Exposed: Yes Tendon Exposed: No Muscle Exposed: No Joint Exposed: No Bone Exposed: No Treatment Notes Wound #1 (Gluteus) Wound Laterality: Left Cleanser Soap and Water Discharge Instruction: Gently cleanse wound with antibacterial soap, rinse and pat dry prior to dressing wounds Peri-Wound Care Topical Primary Dressing Prisma 4.34 (in) Discharge Instruction: tuck into undermined area dry Secondary Dressing Secured With Compression Wrap Compression Stockings Add-Ons Electronic Signature(s) Signed: 06/13/2023 5:02:15 PM By: [name] Signed: 06/14/2023 5:31:20 PM By: Midge Aver MSN RN CNS WTA Entered By: [name] on 06/13/2023 06:53:23 -------------------------------------------------------------------------------- Vitals Details Patient Name: Date of Service: [name], [name]. 06/13/2023 9:00 A M Medical Record Number: [id number] Patient Account Number: [id number] Date of Birth/Sex: Treating RN: 1970-10-01  (52 y.o. [name] Primary Care Kazue Cerro: [name], [name] Other Clinician: [name] Referring Nisaiah Bechtol: Treating Loraine Bhullar/Extender: [name] [place], [place] [name] in Treatment: 12 Vital Signs Time Taken: 09:35 Temperature (F): 98.2 Height (in): 64 Pulse (bpm): 89 Larue, [name] ([id number]) [id number] Page 9 of 9 Weight (lbs): 166 Respiratory Rate (breaths/min): 18 Body Mass Index (BMI): 28.5 Blood Pressure (mmHg): 139/86 Reference Range: 80 - 120 mg / dl Electronic Signature(s) Signed: 06/13/2023 5:02:15 PM By: [name] Entered By: [name] on 06/13/2023 06:42:18

## 2023-06-15 DIAGNOSIS — G8220 Paraplegia, unspecified: Principal | ICD-10-CM

## 2023-06-19 ENCOUNTER — Encounter: Payer: 59 | Admitting: Physician Assistant

## 2023-06-19 ENCOUNTER — Ambulatory Visit: Payer: 59 | Admitting: Physician Assistant

## 2023-06-21 ENCOUNTER — Encounter: Payer: 59 | Admitting: Internal Medicine

## 2023-06-23 ENCOUNTER — Encounter: Payer: 59 | Admitting: Physician Assistant

## 2023-06-23 ENCOUNTER — Telehealth: Admit: 2023-06-23 | Discharge: 2023-06-24 | Payer: MEDICARE

## 2023-06-23 DIAGNOSIS — Z792 Long term (current) use of antibiotics: Principal | ICD-10-CM

## 2023-06-23 DIAGNOSIS — M86152 Other acute osteomyelitis, left femur: Principal | ICD-10-CM

## 2023-06-26 ENCOUNTER — Ambulatory Visit: Payer: 59 | Admitting: Physician Assistant

## 2023-06-26 ENCOUNTER — Encounter: Payer: 59 | Admitting: Physician Assistant

## 2023-06-27 ENCOUNTER — Encounter: Payer: 59 | Attending: Physician Assistant | Admitting: Physician Assistant

## 2023-06-27 ENCOUNTER — Encounter: Payer: 59 | Admitting: Physician Assistant

## 2023-06-27 DIAGNOSIS — M8668 Other chronic osteomyelitis, other site: Secondary | ICD-10-CM | POA: Diagnosis present

## 2023-06-27 DIAGNOSIS — L89324 Pressure ulcer of left buttock, stage 4: Secondary | ICD-10-CM | POA: Diagnosis not present

## 2023-06-27 DIAGNOSIS — G35 Multiple sclerosis: Secondary | ICD-10-CM | POA: Insufficient documentation

## 2023-06-27 NOTE — Progress Notes (Addendum)
[name], [name] ([id number]) [id number] Page 1 of 8 Visit Report for 06/27/2023 Chief Complaint Document Details Patient Name: Date of Service: [name], [name] 06/27/2023 2:30 PM Medical Record Number: [id number] Patient Account Number: [id number] Date of Birth/Sex: Treating RN: 07-Sep-1971 (52 y.o. [name] Primary Care Provider: [name], [name] Other Clinician: Referring Provider: Treating Provider/Extender: [name] GO LDBECK, [place] [name] in Treatment: 14 Information Obtained from: Patient Chief Complaint Pressure ulcer left gluteal region Electronic Signature(s) Signed: 06/27/2023 2:50:15 PM By: [name] PA-C Entered By: [name] on 06/27/2023 11:50:15 -------------------------------------------------------------------------------- HPI Details Patient Name: Date of Service: [name], [name] 06/27/2023 2:30 PM Medical Record Number: [id number] Patient Account Number: [id number] Date of Birth/Sex: Treating RN: 02/18/1971 (52 y.o. [name] Primary Care Provider: [name], [name] Other Clinician: Referring Provider: Treating Provider/Extender: [name] GO [place], [place] [name] in Treatment: 14 History of Present Illness HPI Description: 03-21-2023 upon evaluation today patient appears for initial evaluation regarding a wound that actually began around 03/07/2023. With that being said she has been having issues here with this area and then subsequently ended up in the hospital due to a high fever. She was discharged from the hospital and then started on oral antibiotics that was on 03-16-2023. The oral antibiotics are doxycycline and Keflex that she is currently taking. With that being said she was diagnosed with multiple sclerosis in 2000 [name] she has been in a well motorized wheelchair since 2014. She is a pleasant individual and unfortunately this is the issue that she is having currently with a pressure ulcer although  this is the good news is it is the first time she has had this happen she has an excellent caregiver that takes great care of her and the family is very attentive. 03-28-2023 upon evaluation today patient appears to be doing worse with regard to the irritation around the edges of her wound. The good news is the wound itself does not appear worse but the irritation is definitely not as good as even what I saw last week. In the interim since last week [name] her aide has actually broken her wrist and is no longer helping with the dressing changes I think this is part of the issue here. Fortunately I do not see any evidence of infection locally or systemically which is good news. 04-04-2023 upon evaluation today patient's wounds are actually showing signs of significant improvement which is great news. Fortunately I do not see any evidence of worsening overall I think that her family is making excellent progress towards getting this closed she is doing a good job offloading we will try to get the air-fluidized bed but that is just not working out her insurance at this point is denying this. She does have an alternating air mattress at home although it has been apparently on static mode I did advise this to be switched to alternating mode. HEATER, OSUCH ([id number]) [id number] Page 2 of 8 04-11-2023 upon evaluation today patient appears to be doing better in regard to her wound. She has been tolerating the dressing changes without complication. Fortunately I do not see any signs of active infection locally or systemically at this time. Her wound does have a lot of blue-green drainage which has made concerned about Pseudomonas hanging out of the area. 7/23; improved wound which is a pressure ulcer on the left buttock in a patient with advanced MS. She has a surface on her mattress at  VAC although I have not made that decision as of yet. Patient does have a follow-up appointment infectious disease on June 01, 2023 this will be a video visit. 06-06-2023 upon evaluation today patient appears to be doing about the same in regard to her wound. Fortunately I do not see any evidence of worsening overall. I do believe that she is tolerating the dressing changes without complication. The good news is that she really does seem to be stable and I feel like the infection  is getting under better control which is good news. The wound itself though is really unchanged as far as depth is concerned this is what has me most concerned at this point. I think that we may want to consider a wound VAC and to be honest I think hyperbaric oxygen therapy would also be a potential option here for her if we can gain approval. 06-13-2023 upon evaluation today patient appears to be doing well currently in regard to her wound which is actually showing some signs of improvement this is good news. Fortunately I do not see any evidence of worsening I think that the depth is improving and I think that the wound VAC may help to get this to feeling much more rapidly which will be awesome as well. Overall I am extremely pleased with where we stand. 06-27-2023 upon evaluation today patient appears to be doing okay in regard to her wound unfortunately though the wound VAC is not going to be able to continue to utilize due to how small the opening of the wound is. There is really not much that I can do about this at this point as again we cannot really make the opening any larger here in the clinic. She still has a lot of healing to do inside there is no direct bone exposure there is connective tissue that has not covered over with granulation yet but again the big thing is trying to get the granulation filling in. There are few options that we have some of which are not actually hospital approved yet although we can work on getting approval for this in the meantime I think that hyperbarics is something that we need to strongly consider I did discuss that again with her today she is already actually been approved we just been holding for when and if she was ready to go forward with this. Electronic Signature(s) Signed: 06/27/2023 4:17:40 PM By: [name] PA-C Entered By: [name] on 06/27/2023 13:17:40 -------------------------------------------------------------------------------- Physical  Exam Details Patient Name: Date of Service: [name], [name] 06/27/2023 2:30 PM Medical Record Number: [id number] Patient Account Number: [id number] Date of Birth/Sex: Treating RN: [birth year] (53 y.o. [name] Primary Care Provider: [name], [name] Other Clinician: Referring Provider: Treating Provider/Extender: [name] GO [place], [place] [name] in Treatment: 14 Constitutional Well-nourished and well-hydrated in no acute distress. Respiratory normal breathing without difficulty. [name], [name] ([id number]) [id number] Page 3 of 8 Psychiatric this patient is able to make decisions and demonstrates good insight into disease process. Alert and Oriented x 3. pleasant and cooperative. Notes Upon inspection there does not appear to be any specific signs of infection at this point. Fortunately I do not see any signs of purulent drainage in general and I think overall the biggest thing is gena be that she is getting need to have this changed more frequently externally although what we put inside I think can stay in between for when Angelica Chessman comes out 3 times per  VAC although I have not made that decision as of yet. Patient does have a follow-up appointment infectious disease on June 01, 2023 this will be a video visit. 06-06-2023 upon evaluation today patient appears to be doing about the same in regard to her wound. Fortunately I do not see any evidence of worsening overall. I do believe that she is tolerating the dressing changes without complication. The good news is that she really does seem to be stable and I feel like the infection  is getting under better control which is good news. The wound itself though is really unchanged as far as depth is concerned this is what has me most concerned at this point. I think that we may want to consider a wound VAC and to be honest I think hyperbaric oxygen therapy would also be a potential option here for her if we can gain approval. 06-13-2023 upon evaluation today patient appears to be doing well currently in regard to her wound which is actually showing some signs of improvement this is good news. Fortunately I do not see any evidence of worsening I think that the depth is improving and I think that the wound VAC may help to get this to feeling much more rapidly which will be awesome as well. Overall I am extremely pleased with where we stand. 06-27-2023 upon evaluation today patient appears to be doing okay in regard to her wound unfortunately though the wound VAC is not going to be able to continue to utilize due to how small the opening of the wound is. There is really not much that I can do about this at this point as again we cannot really make the opening any larger here in the clinic. She still has a lot of healing to do inside there is no direct bone exposure there is connective tissue that has not covered over with granulation yet but again the big thing is trying to get the granulation filling in. There are few options that we have some of which are not actually hospital approved yet although we can work on getting approval for this in the meantime I think that hyperbarics is something that we need to strongly consider I did discuss that again with her today she is already actually been approved we just been holding for when and if she was ready to go forward with this. Electronic Signature(s) Signed: 06/27/2023 4:17:40 PM By: [name] PA-C Entered By: [name] on 06/27/2023 13:17:40 -------------------------------------------------------------------------------- Physical  Exam Details Patient Name: Date of Service: [name], [name] 06/27/2023 2:30 PM Medical Record Number: [id number] Patient Account Number: [id number] Date of Birth/Sex: Treating RN: [birth year] (53 y.o. [name] Primary Care Provider: [name], [name] Other Clinician: Referring Provider: Treating Provider/Extender: [name] GO [place], [place] [name] in Treatment: 14 Constitutional Well-nourished and well-hydrated in no acute distress. Respiratory normal breathing without difficulty. [name], [name] ([id number]) [id number] Page 3 of 8 Psychiatric this patient is able to make decisions and demonstrates good insight into disease process. Alert and Oriented x 3. pleasant and cooperative. Notes Upon inspection there does not appear to be any specific signs of infection at this point. Fortunately I do not see any signs of purulent drainage in general and I think overall the biggest thing is gena be that she is getting need to have this changed more frequently externally although what we put inside I think can stay in between for when Angelica Chessman comes out 3 times per  [name], [name] ([id number]) [id number] Page 1 of 8 Visit Report for 06/27/2023 Chief Complaint Document Details Patient Name: Date of Service: [name], [name] 06/27/2023 2:30 PM Medical Record Number: [id number] Patient Account Number: [id number] Date of Birth/Sex: Treating RN: 07-Sep-1971 (52 y.o. [name] Primary Care Provider: [name], [name] Other Clinician: Referring Provider: Treating Provider/Extender: [name] GO [place], [place] [name] in Treatment: 14 Information Obtained from: Patient Chief Complaint Pressure ulcer left gluteal region Electronic Signature(s) Signed: 06/27/2023 2:50:15 PM By: [name] PA-C Entered By: [name] on 06/27/2023 11:50:15 -------------------------------------------------------------------------------- HPI Details Patient Name: Date of Service: [name], [name] 06/27/2023 2:30 PM Medical Record Number: [id number] Patient Account Number: [id number] Date of Birth/Sex: Treating RN: 02/18/1971 (52 y.o. [name] Primary Care Provider: [name], [name] Other Clinician: Referring Provider: Treating Provider/Extender: [name] GO [place], [place] [name] in Treatment: 14 History of Present Illness HPI Description: 03-21-2023 upon evaluation today patient appears for initial evaluation regarding a wound that actually began around 03/07/2023. With that being said she has been having issues here with this area and then subsequently ended up in the hospital due to a high fever. She was discharged from the hospital and then started on oral antibiotics that was on 03-16-2023. The oral antibiotics are doxycycline and Keflex that she is currently taking. With that being said she was diagnosed with multiple sclerosis in 2000 [name] she has been in a well motorized wheelchair since 2014. She is a pleasant individual and unfortunately this is the issue that she is having currently with a pressure ulcer although  this is the good news is it is the first time she has had this happen she has an excellent caregiver that takes great care of her and the family is very attentive. 03-28-2023 upon evaluation today patient appears to be doing worse with regard to the irritation around the edges of her wound. The good news is the wound itself does not appear worse but the irritation is definitely not as good as even what I saw last week. In the interim since last week [name] her aide has actually broken her wrist and is no longer helping with the dressing changes I think this is part of the issue here. Fortunately I do not see any evidence of infection locally or systemically which is good news. 04-04-2023 upon evaluation today patient's wounds are actually showing signs of significant improvement which is great news. Fortunately I do not see any evidence of worsening overall I think that her family is making excellent progress towards getting this closed she is doing a good job offloading we will try to get the air-fluidized bed but that is just not working out her insurance at this point is denying this. She does have an alternating air mattress at home although it has been apparently on static mode I did advise this to be switched to alternating mode. HEATER, OSUCH ([id number]) [id number] Page 2 of 8 04-11-2023 upon evaluation today patient appears to be doing better in regard to her wound. She has been tolerating the dressing changes without complication. Fortunately I do not see any signs of active infection locally or systemically at this time. Her wound does have a lot of blue-green drainage which has made concerned about Pseudomonas hanging out of the area. 7/23; improved wound which is a pressure ulcer on the left buttock in a patient with advanced MS. She has a surface on her mattress at  [name] PA-C Signed: 07/24/2023 8:01:26 AM By: Yevonne Pax RN Previous Signature: 06/27/2023 3:34:05 PM Version By: Yevonne Pax RN Previous Signature: 06/27/2023 4:56:48 PM Version By: [name] PA-C Entered By: Yevonne Pax on 06/28/2023 07:08:32 -------------------------------------------------------------------------------- Problem List Details Patient Name: Date of Service: [name], Destina L. 06/27/2023 2:30 PM Medical Record Number: [id number] Patient Account Number: [id number] Date of Birth/Sex: Treating RN: Oct 19, 1970 (52 y.o. 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 06/27/2023 2:30 PM Medical Record Number: [id number] Patient Account Number: [id number] Date of Birth/Sex: Treating RN: [birth year] (52 y.o. [name] Primary Care Provider: [name], [name] Other Clinician: Referring Provider: Treating Provider/Extender: [name] GO [place], [place] [name] in Treatment: 14 Subjective Chief Complaint Information obtained from Patient Pressure ulcer left gluteal region History of Present Illness (HPI) 03-21-2023 upon evaluation today patient appears for initial evaluation regarding a wound that actually began around 03/07/2023. With that being said she has been having issues here with this area and then subsequently ended up in the hospital due to a high fever. She was discharged from the hospital and then started on oral antibiotics that was on 03-16-2023. The oral antibiotics are doxycycline and Keflex that she is currently taking. With that being said she was diagnosed with multiple sclerosis in 2000 [name] she has been in a well motorized wheelchair since 2014. She is a pleasant individual and unfortunately this is the issue that she is having currently with a pressure ulcer although this is the good news is it is the first time she has had this happen  she has an excellent caregiver that takes great care of her and the family is very attentive. 03-28-2023 upon evaluation today patient appears to be doing worse with regard to the irritation around the edges of her wound. The good news is the wound itself does not appear worse but the irritation is definitely not as good as even what I saw last week. In the interim since last week [name] her aide has actually broken her wrist and is no longer helping with the dressing changes I think this is part of the issue here. Fortunately I do not see any evidence of infection locally or systemically which is good news. 04-04-2023 upon evaluation today patient's wounds are actually showing signs of significant improvement which is great news. Fortunately I do not see any evidence of worsening overall I think that her family is making excellent progress towards getting this closed she is doing a good job offloading we will try to get the air-fluidized bed but that is just not working out her insurance at this point is denying this. She does have an alternating air mattress at home although it has been apparently on static mode I did advise this to be switched to alternating mode. 04-11-2023 upon evaluation today patient appears to be doing better in regard to her wound. She has  week. It is just the border foam dressing external that can be changed as needed. Electronic Signature(s) Signed: 06/27/2023 4:18:07 PM By: [name] PA-C Entered By: [name] on 06/27/2023 13:18:07 -------------------------------------------------------------------------------- Physician Orders Details Patient Name: Date of Service: [name], [name] 06/27/2023 2:30 PM Medical Record Number: [id number] Patient Account Number: [id number] Date of Birth/Sex: Treating RN: 01-Dec-1970 (52 y.o. 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 Home Health Home Health Company: - [facility] fax: [phone] [facility] for wound care. May utilize formulary equivalent dressing for wound treatment orders unless otherwise specified. Home Health Nurse may visit PRN to address patients wound care needs. Scheduled days for dressing changes to be completed; exception, patient has scheduled wound care visit that day. - Wednesday, Friday **Please direct any NON-WOUND related issues/requests for orders to patient's Primary Care Physician. **If current dressing causes regression in wound condition, may D/C ordered dressing product/s and apply Normal Saline Moist Dressing daily until next Wound Healing Center or Other MD appointment. **Notify Wound Healing Center of regression in wound condition at [phone]. Bathing/ [employer] wounds with antibacterial soap and water. Off-Loading Roho cushion for wheelchair Hospital bed/mattress A fluidized (Group 3) - Home Health, please order for patient. ir Turn and reposition every 2 hours - Float heels on pillow when in the bed. Additional Orders / Instructions Follow Nutritious Diet and Increase Protein Intake Negative Pressure Wound Therapy Wound VAC settings at continuous pressure. Use foam to wound cavity. Please order WHITE foam to fill any tunnel/s and/or undermining when necessary. Change VAC dressing 2 X WEEK. Change canister as indicated when full. - Home Health to change wound vac 2 x week ( Wednesday and Fridays), then patient to see wound center on Mondays Place Duoderm to windowpane around wound ON HOLD Other: - Place Prisma into wound dry under wound vac. Cut black foam into inverted mushroom so that the mushroom top fills in the undermined area at 11-1, Stem is out and visible. Then bridge to left flank hip ON LEAYAH SHARP,  Jeffery L ([id number]) [id number] Page 4 of 8 Hyperbaric Oxygen Therapy Evaluate for HBO Therapy Indication and location: - osteomylitis left ishium If appropriate for treatment, begin HBOT per protocol: 2.0 ATA for 90 Minutes without A Breaks ir One treatment per day (delivered Monday through Friday unless otherwise specified in Special Instructions below): Total # of Treatments: - 40 A ntihistamine 30 minutes prior to HBO Treatment, difficulty clearing ears. Wound Treatment Wound #1 - Gluteus Wound Laterality: Left Cleanser: Soap and Water (Home Health) 3 x Per Week/30 Days Discharge Instructions: Gently cleanse wound with antibacterial soap, rinse and pat dry prior to dressing wounds Prim Dressing: Prisma 4.34 (in) 3 x Per Week/30 Days ary Discharge Instructions: tuck into undermined area dry Secondary Dressing: ABD Pad 5x9 (in/in) 3 x Per Week/30 Days Discharge Instructions: Cover with ABD pad Secondary Dressing: hydrofera blue rope 3 x Per Week/30 Days Discharge Instructions: place into wound dry and up and into tunnel Secured With: Medipore T - 74M Medipore H Soft Cloth Surgical T ape ape, 2x2 (in/yd) 3 x Per Week/30 Days Electronic Signature(s) Signed: 06/30/2023 2:08:40 PM By:  VAC although I have not made that decision as of yet. Patient does have a follow-up appointment infectious disease on June 01, 2023 this will be a video visit. 06-06-2023 upon evaluation today patient appears to be doing about the same in regard to her wound. Fortunately I do not see any evidence of worsening overall. I do believe that she is tolerating the dressing changes without complication. The good news is that she really does seem to be stable and I feel like the infection  is getting under better control which is good news. The wound itself though is really unchanged as far as depth is concerned this is what has me most concerned at this point. I think that we may want to consider a wound VAC and to be honest I think hyperbaric oxygen therapy would also be a potential option here for her if we can gain approval. 06-13-2023 upon evaluation today patient appears to be doing well currently in regard to her wound which is actually showing some signs of improvement this is good news. Fortunately I do not see any evidence of worsening I think that the depth is improving and I think that the wound VAC may help to get this to feeling much more rapidly which will be awesome as well. Overall I am extremely pleased with where we stand. 06-27-2023 upon evaluation today patient appears to be doing okay in regard to her wound unfortunately though the wound VAC is not going to be able to continue to utilize due to how small the opening of the wound is. There is really not much that I can do about this at this point as again we cannot really make the opening any larger here in the clinic. She still has a lot of healing to do inside there is no direct bone exposure there is connective tissue that has not covered over with granulation yet but again the big thing is trying to get the granulation filling in. There are few options that we have some of which are not actually hospital approved yet although we can work on getting approval for this in the meantime I think that hyperbarics is something that we need to strongly consider I did discuss that again with her today she is already actually been approved we just been holding for when and if she was ready to go forward with this. Electronic Signature(s) Signed: 06/27/2023 4:17:40 PM By: [name] PA-C Entered By: [name] on 06/27/2023 13:17:40 -------------------------------------------------------------------------------- Physical  Exam Details Patient Name: Date of Service: [name], [name] 06/27/2023 2:30 PM Medical Record Number: [id number] Patient Account Number: [id number] Date of Birth/Sex: Treating RN: [birth year] (53 y.o. [name] Primary Care Provider: [name], [name] Other Clinician: Referring Provider: Treating Provider/Extender: [name] GO LDBECK, [place] [name] in Treatment: 14 Constitutional Well-nourished and well-hydrated in no acute distress. Respiratory normal breathing without difficulty. [name], [name] ([id number]) [id number] Page 3 of 8 Psychiatric this patient is able to make decisions and demonstrates good insight into disease process. Alert and Oriented x 3. pleasant and cooperative. Notes Upon inspection there does not appear to be any specific signs of infection at this point. Fortunately I do not see any signs of purulent drainage in general and I think overall the biggest thing is gena be that she is getting need to have this changed more frequently externally although what we put inside I think can stay in between for when Angelica Chessman comes out 3 times per  [name] PA-C Signed: 07/24/2023 8:01:26 AM By: Yevonne Pax RN Previous Signature: 06/27/2023 3:34:05 PM Version By: Yevonne Pax RN Previous Signature: 06/27/2023 4:56:48 PM Version By: [name] PA-C Entered By: Yevonne Pax on 06/28/2023 07:08:32 -------------------------------------------------------------------------------- Problem List Details Patient Name: Date of Service: [name], Destina L. 06/27/2023 2:30 PM Medical Record Number: [id number] Patient Account Number: [id number] Date of Birth/Sex: Treating RN: Oct 19, 1970 (52 y.o. 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 06/27/2023 2:30 PM Medical Record Number: [id number] Patient Account Number: [id number] Date of Birth/Sex: Treating RN: [birth year] (52 y.o. [name] Primary Care Provider: [name], [name] Other Clinician: Referring Provider: Treating Provider/Extender: [name] GO [place], [place] [name] in Treatment: 14 Subjective Chief Complaint Information obtained from Patient Pressure ulcer left gluteal region History of Present Illness (HPI) 03-21-2023 upon evaluation today patient appears for initial evaluation regarding a wound that actually began around 03/07/2023. With that being said she has been having issues here with this area and then subsequently ended up in the hospital due to a high fever. She was discharged from the hospital and then started on oral antibiotics that was on 03-16-2023. The oral antibiotics are doxycycline and Keflex that she is currently taking. With that being said she was diagnosed with multiple sclerosis in 2000 [name] she has been in a well motorized wheelchair since 2014. She is a pleasant individual and unfortunately this is the issue that she is having currently with a pressure ulcer although this is the good news is it is the first time she has had this happen  she has an excellent caregiver that takes great care of her and the family is very attentive. 03-28-2023 upon evaluation today patient appears to be doing worse with regard to the irritation around the edges of her wound. The good news is the wound itself does not appear worse but the irritation is definitely not as good as even what I saw last week. In the interim since last week [name] her aide has actually broken her wrist and is no longer helping with the dressing changes I think this is part of the issue here. Fortunately I do not see any evidence of infection locally or systemically which is good news. 04-04-2023 upon evaluation today patient's wounds are actually showing signs of significant improvement which is great news. Fortunately I do not see any evidence of worsening overall I think that her family is making excellent progress towards getting this closed she is doing a good job offloading we will try to get the air-fluidized bed but that is just not working out her insurance at this point is denying this. She does have an alternating air mattress at home although it has been apparently on static mode I did advise this to be switched to alternating mode. 04-11-2023 upon evaluation today patient appears to be doing better in regard to her wound. She has  week. It is just the border foam dressing external that can be changed as needed. Electronic Signature(s) Signed: 06/27/2023 4:18:07 PM By: [name] PA-C Entered By: [name] on 06/27/2023 13:18:07 -------------------------------------------------------------------------------- Physician Orders Details Patient Name: Date of Service: [name], [name] 06/27/2023 2:30 PM Medical Record Number: [id number] Patient Account Number: [id number] Date of Birth/Sex: Treating RN: 01-Dec-1970 (52 y.o. 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 Home Health Home Health Company: - [facility] fax: [phone] [facility] for wound care. May utilize formulary equivalent dressing for wound treatment orders unless otherwise specified. Home Health Nurse may visit PRN to address patients wound care needs. Scheduled days for dressing changes to be completed; exception, patient has scheduled wound care visit that day. - Wednesday, Friday **Please direct any NON-WOUND related issues/requests for orders to patient's Primary Care Physician. **If current dressing causes regression in wound condition, may D/C ordered dressing product/s and apply Normal Saline Moist Dressing daily until next Wound Healing Center or Other MD appointment. **Notify Wound Healing Center of regression in wound condition at [phone]. Bathing/ [employer] wounds with antibacterial soap and water. Off-Loading Roho cushion for wheelchair Hospital bed/mattress A fluidized (Group 3) - Home Health, please order for patient. ir Turn and reposition every 2 hours - Float heels on pillow when in the bed. Additional Orders / Instructions Follow Nutritious Diet and Increase Protein Intake Negative Pressure Wound Therapy Wound VAC settings at continuous pressure. Use foam to wound cavity. Please order WHITE foam to fill any tunnel/s and/or undermining when necessary. Change VAC dressing 2 X WEEK. Change canister as indicated when full. - Home Health to change wound vac 2 x week ( Wednesday and Fridays), then patient to see wound center on Mondays Place Duoderm to windowpane around wound ON HOLD Other: - Place Prisma into wound dry under wound vac. Cut black foam into inverted mushroom so that the mushroom top fills in the undermined area at 11-1, Stem is out and visible. Then bridge to left flank hip ON LEAYAH SHARP,  Jeffery L ([id number]) [id number] Page 4 of 8 Hyperbaric Oxygen Therapy Evaluate for HBO Therapy Indication and location: - osteomylitis left ishium If appropriate for treatment, begin HBOT per protocol: 2.0 ATA for 90 Minutes without A Breaks ir One treatment per day (delivered Monday through Friday unless otherwise specified in Special Instructions below): Total # of Treatments: - 40 A ntihistamine 30 minutes prior to HBO Treatment, difficulty clearing ears. Wound Treatment Wound #1 - Gluteus Wound Laterality: Left Cleanser: Soap and Water (Home Health) 3 x Per Week/30 Days Discharge Instructions: Gently cleanse wound with antibacterial soap, rinse and pat dry prior to dressing wounds Prim Dressing: Prisma 4.34 (in) 3 x Per Week/30 Days ary Discharge Instructions: tuck into undermined area dry Secondary Dressing: ABD Pad 5x9 (in/in) 3 x Per Week/30 Days Discharge Instructions: Cover with ABD pad Secondary Dressing: hydrofera blue rope 3 x Per Week/30 Days Discharge Instructions: place into wound dry and up and into tunnel Secured With: Medipore T - 74M Medipore H Soft Cloth Surgical T ape ape, 2x2 (in/yd) 3 x Per Week/30 Days Electronic Signature(s) Signed: 06/30/2023 2:08:40 PM By:

## 2023-06-27 NOTE — Progress Notes (Addendum)
0 Support Surface(s) Assessment (bed, cushion, seat, etc.) INTERVENTIONS - Wound Cleansing / Measurement X - Simple Wound Cleansing - one wound 1 5 []  - 0 Complex Wound Cleansing - multiple wounds X- 1 5 Wound Imaging (photographs - any number of wounds) []  - 0 Wound Tracing (instead of photographs) X- 1 5 Simple Wound Measurement - one wound []  - 0 Complex Wound Measurement - multiple wounds INTERVENTIONS - Wound Dressings X - Small Wound Dressing one or multiple wounds 1 10 []  - 0 Medium Wound Dressing one or multiple wounds []  - 0 Large Wound Dressing one or multiple wounds []  - 0 Application of Medications - topical []  - 0 Application of Medications - injection INTERVENTIONS - Miscellaneous []  - 0 External ear exam ADALAIDE, GUIDERA ([id number]) [id number] Page 3 of 8 []  - 0 Specimen Collection (cultures, biopsies, blood, body fluids, etc.) []  - 0 Specimen(s) / Culture(s) sent or taken to Lab for analysis []  - 0 Patient Transfer (multiple staff / Michiel Sites Lift / Similar devices) []  - 0 Simple Staple / Suture removal (25 or less) []  - 0 Complex Staple / Suture removal (26 or more) []  - 0 Hypo / Hyperglycemic Management (close monitor of Blood Glucose) []  - 0 Ankle / Brachial Index (ABI) - do not check if billed separately X- 1 5 Vital Signs Has the patient been seen at the hospital within the last three years: Yes Total Score: 75 Level Of Care: New/Established - Level 2 Electronic Signature(s) Signed: 07/24/2023 8:01:26 AM By: Yevonne Pax RN Entered By: Yevonne Pax on 06/27/2023 12:35:44 -------------------------------------------------------------------------------- Encounter Discharge Information Details Patient Name: Date of Service: [name], Devaeh L.  06/27/2023 2:30 PM Medical Record Number: [id number] Patient Account Number: [id number] Date of Birth/Sex: Treating RN: 04/13/1971 (52 y.o. [name] Primary Care Briyonna Omara: Bertram Denver Other Clinician: Referring Darleth Eustache: Treating Reneshia Zuccaro/Extender: [name] [place], [place] [name] in Treatment: 14 Encounter Discharge Information Items Discharge Condition: Stable Ambulatory Status: Wheelchair Discharge Destination: Home Transportation: Private Auto Accompanied By: caregiver Schedule Follow-up Appointment: Yes Clinical Summary of Care: Electronic Signature(s) Signed: 06/27/2023 3:40:21 PM By: Yevonne Pax RN Entered By: Yevonne Pax on 06/27/2023 12:40:21 Lower Extremity Assessment Details -------------------------------------------------------------------------------- [name] ([id number]) [id number] Page 4 of 8 Patient Name: Date of Service: DELANI, RAMALHO 06/27/2023 2:30 PM Medical Record Number: [id number] Patient Account Number: [id number] Date of Birth/Sex: Treating RN: 23-Oct-1970 (52 y.o. [name] Primary Care Amilliana Hayworth: Bertram Denver Other Clinician: Referring Maron Stanzione: Treating Candee Hoon/Extender: [name] [place], [place] [name] in Treatment: 14 Electronic Signature(s) Signed: 06/27/2023 3:26:43 PM By: Yevonne Pax RN Entered By: Yevonne Pax on 06/27/2023 12:26:43 -------------------------------------------------------------------------------- Multi Wound Chart Details Patient Name: Date of Service: MO [name] RE, Kirat L. 06/27/2023 2:30 PM Medical Record Number: [id number] Patient Account Number: [id number] Date of Birth/Sex: Treating RN: Nov 04, 1970 (52 y.o. [name] Primary Care Ilya Neely: [name], [name] Other Clinician: Referring Jamarius Saha: Treating Myrl Lazarus/Extender: [name] [place], [place] [name] in Treatment: 14 Vital Signs Height(in): 64 Pulse(bpm): 104 Weight(lbs): 166 Blood  Pressure(mmHg): 110/67 Body Mass Index(BMI): 28.5 Temperature(F): 98.3 Respiratory Rate(breaths/min): 18 [1:Photos: No Photos Left Gluteus Wound Location: Blister Wounding Event: Pressure Ulcer Primary Etiology: Neuropathy Comorbid History: 03/07/2023 Date Acquired: 14 Weeks of Treatment: Open Wound Status: No Wound Recurrence: 0.7x0.4x3.5 Measurements L x W x D (cm)  0.22 A (cm) : rea 0.77 Volume (cm) : 84.40% % Reduction in A rea: 74.10% % Reduction  GENE, NISKA ([id number]) [id number] Page 1 of 8 Visit Report for 06/27/2023 Arrival Information Details Patient Name: Date of Service: MITSUKO, MASTROGIOVANNI 06/27/2023 2:30 PM Medical Record Number: [id number] Patient Account Number: [id number] Date of Birth/Sex: Treating RN: 03-24-1971 (52 y.o. [name] Primary Care Rockie Schnoor: [name], [name] Other Clinician: Referring Tykiera Raven: Treating Zayneb Baucum/Extender: [name] [place], [place] [name] in Treatment: 14 Visit Information History Since Last Visit Added or deleted any medications: No Patient Arrived: Wheel Chair Any new allergies or adverse reactions: No Arrival Time: 14:53 Had a fall or experienced change in No Accompanied By: self activities of daily living that may affect Transfer Assistance: None risk of falls: Patient Identification Verified: Yes Signs or symptoms of abuse/neglect since last visito No Secondary Verification Process Completed: Yes Hospitalized since last visit: No Patient Requires Transmission-Based Precautions: No Implantable device outside of the clinic excluding No Patient Has Alerts: Yes cellular tissue based products placed in the center Patient Alerts: NOT DIABETIC since last visit: Has Dressing in Place as Prescribed: Yes Has Compression in Place as Prescribed: Yes Pain Present Now: No Electronic Signature(s) Signed: 06/27/2023 2:53:50 PM By: Yevonne Pax RN Entered By: Yevonne Pax on 06/27/2023 11:53:50 -------------------------------------------------------------------------------- Clinic Level of Care Assessment Details Patient Name: Date of Service: EMANII, KANNING 06/27/2023 2:30 PM Medical Record Number: [id number] Patient Account Number: [id number] Date of Birth/Sex: Treating RN: 1971/05/22 (52 y.o. [name] Primary Care Kiesha Ensey: [name], [name] Other Clinician: Referring Melainie Krinsky: Treating Gladyes Kudo/Extender: [name] [place],  [place] [name] in Treatment: 14 Clinic Level of Care Assessment Items TOOL 4 Quantity Score X- 1 0 Use when only an EandM is performed on FOLLOW-UP visit ASSESSMENTS - Nursing Assessment / Reassessment X- 1 10 Reassessment of Co-morbidities (includes updates in patient status) X- 1 5 Reassessment of Adherence to Treatment Plan LEI, MICHELA ([id number]) [phone].pdf Page 2 of 8 ASSESSMENTS - Wound and Skin A ssessment / Reassessment X - Simple Wound Assessment / Reassessment - one wound 1 5 []  - 0 Complex Wound Assessment / Reassessment - multiple wounds []  - 0 Dermatologic / Skin Assessment (not related to wound area) ASSESSMENTS - Focused Assessment []  - 0 Circumferential Edema Measurements - multi extremities []  - 0 Nutritional Assessment / Counseling / Intervention []  - 0 Lower Extremity Assessment (monofilament, tuning fork, pulses) []  - 0 Peripheral Arterial Disease Assessment (using hand held doppler) ASSESSMENTS - Ostomy and/or Continence Assessment and Care []  - 0 Incontinence Assessment and Management []  - 0 Ostomy Care Assessment and Management (repouching, etc.) PROCESS - Coordination of Care X - Simple Patient / Family Education for ongoing care 1 15 []  - 0 Complex (extensive) Patient / Family Education for ongoing care []  - 0 Staff obtains Chiropractor, Records, T Results / Process Orders est []  - 0 Staff telephones HHA, Nursing Homes / Clarify orders / etc []  - 0 Routine Transfer to another Facility (non-emergent condition) []  - 0 Routine Hospital Admission (non-emergent condition) []  - 0 New Admissions / Manufacturing engineer / Ordering NPWT Apligraf, etc. , []  - 0 Emergency Hospital Admission (emergent condition) X- 1 10 Simple Discharge Coordination []  - 0 Complex (extensive) Discharge Coordination PROCESS - Special Needs []  - 0 Pediatric / Minor Patient Management []  - 0 Isolation Patient Management []  -  0 Hearing / Language / Visual special needs []  - 0 Assessment of Community assistance (transportation, D/C planning, etc.) []  - 0 Additional assistance / Altered mentation []  -  GENE, NISKA ([id number]) [id number] Page 1 of 8 Visit Report for 06/27/2023 Arrival Information Details Patient Name: Date of Service: MITSUKO, MASTROGIOVANNI 06/27/2023 2:30 PM Medical Record Number: [id number] Patient Account Number: [id number] Date of Birth/Sex: Treating RN: 03-24-1971 (52 y.o. [name] Primary Care Rockie Schnoor: [name], [name] Other Clinician: Referring Tykiera Raven: Treating Zayneb Baucum/Extender: [name] [place], [place] [name] in Treatment: 14 Visit Information History Since Last Visit Added or deleted any medications: No Patient Arrived: Wheel Chair Any new allergies or adverse reactions: No Arrival Time: 14:53 Had a fall or experienced change in No Accompanied By: self activities of daily living that may affect Transfer Assistance: None risk of falls: Patient Identification Verified: Yes Signs or symptoms of abuse/neglect since last visito No Secondary Verification Process Completed: Yes Hospitalized since last visit: No Patient Requires Transmission-Based Precautions: No Implantable device outside of the clinic excluding No Patient Has Alerts: Yes cellular tissue based products placed in the center Patient Alerts: NOT DIABETIC since last visit: Has Dressing in Place as Prescribed: Yes Has Compression in Place as Prescribed: Yes Pain Present Now: No Electronic Signature(s) Signed: 06/27/2023 2:53:50 PM By: Yevonne Pax RN Entered By: Yevonne Pax on 06/27/2023 11:53:50 -------------------------------------------------------------------------------- Clinic Level of Care Assessment Details Patient Name: Date of Service: EMANII, KANNING 06/27/2023 2:30 PM Medical Record Number: [id number] Patient Account Number: [id number] Date of Birth/Sex: Treating RN: 1971/05/22 (52 y.o. [name] Primary Care Kiesha Ensey: [name], [name] Other Clinician: Referring Melainie Krinsky: Treating Gladyes Kudo/Extender: [name] [place],  [place] [name] in Treatment: 14 Clinic Level of Care Assessment Items TOOL 4 Quantity Score X- 1 0 Use when only an EandM is performed on FOLLOW-UP visit ASSESSMENTS - Nursing Assessment / Reassessment X- 1 10 Reassessment of Co-morbidities (includes updates in patient status) X- 1 5 Reassessment of Adherence to Treatment Plan LEI, MICHELA ([id number]) [phone].pdf Page 2 of 8 ASSESSMENTS - Wound and Skin A ssessment / Reassessment X - Simple Wound Assessment / Reassessment - one wound 1 5 []  - 0 Complex Wound Assessment / Reassessment - multiple wounds []  - 0 Dermatologic / Skin Assessment (not related to wound area) ASSESSMENTS - Focused Assessment []  - 0 Circumferential Edema Measurements - multi extremities []  - 0 Nutritional Assessment / Counseling / Intervention []  - 0 Lower Extremity Assessment (monofilament, tuning fork, pulses) []  - 0 Peripheral Arterial Disease Assessment (using hand held doppler) ASSESSMENTS - Ostomy and/or Continence Assessment and Care []  - 0 Incontinence Assessment and Management []  - 0 Ostomy Care Assessment and Management (repouching, etc.) PROCESS - Coordination of Care X - Simple Patient / Family Education for ongoing care 1 15 []  - 0 Complex (extensive) Patient / Family Education for ongoing care []  - 0 Staff obtains Chiropractor, Records, T Results / Process Orders est []  - 0 Staff telephones HHA, Nursing Homes / Clarify orders / etc []  - 0 Routine Transfer to another Facility (non-emergent condition) []  - 0 Routine Hospital Admission (non-emergent condition) []  - 0 New Admissions / Manufacturing engineer / Ordering NPWT Apligraf, etc. , []  - 0 Emergency Hospital Admission (emergent condition) X- 1 10 Simple Discharge Coordination []  - 0 Complex (extensive) Discharge Coordination PROCESS - Special Needs []  - 0 Pediatric / Minor Patient Management []  - 0 Isolation Patient Management []  -  0 Hearing / Language / Visual special needs []  - 0 Assessment of Community assistance (transportation, D/C planning, etc.) []  - 0 Additional assistance / Altered mentation []  -  Carrie on 06/27/2023 12:36:40 -------------------------------------------------------------------------------- Wound Assessment Details Patient Name: Date of Service: CARMENLITA, MALLORY 06/27/2023 2:30 PM Medical Record Number: [id number] Patient Account Number: [id number] Date of Birth/Sex: Treating RN: 1971-08-21 (52 y.o. [name] Primary Care Roselinda Bahena: [name], [name] Other Clinician: Referring Gawain Crombie: Treating Shelina Luo/Extender: [name] [place], [place] [name] in Treatment: 14 Wound Status Wound Number: 1 Primary Etiology: Pressure Ulcer Wound Location: Left Gluteus Wound Status: Open Wounding Event: Blister Comorbid History: Neuropathy Date Acquired:  03/07/2023 Weeks Of Treatment: 14 Clustered Wound: No Wound Measurements Length: (cm) 0.7 Width: (cm) 0.4 Depth: (cm) 3.5 Area: (cm) 0.22 Volume: (cm) 0.77 % Reduction in Area: 84.4% % Reduction in Volume: 74.1% Epithelialization: None Tunneling: Yes Position (o'clock): 1 Undermining: No Wound Description Classification: Category/Stage IV Exudate Amount: Medium Exudate Type: Serosanguineous Exudate Color: red, brown Foul Odor After Cleansing: No Slough/Fibrino No Wound Bed Granulation Amount: Large (67-100%) Exposed Structure Granulation Quality: Red Fascia Exposed: No Necrotic Amount: None Present (0%) Fat Layer (Subcutaneous Tissue) Exposed: Yes Tendon Exposed: No Muscle Exposed: No Joint Exposed: No Bone Exposed: No Daysi, Cayce Jerris L ([id number]) [id number] Page 8 of 8 Electronic Signature(s) Signed: 06/27/2023 2:57:33 PM By: Yevonne Pax RN Entered By: Yevonne Pax on 06/27/2023 11:57:32 -------------------------------------------------------------------------------- Vitals Details Patient Name: Date of Service: MO [name] RE, Lilya L. 06/27/2023 2:30 PM Medical Record Number: [id number] Patient Account Number: [id number] Date of Birth/Sex: Treating RN: September 07, 1971 (52 y.o. [name] Primary Care Drew Lips: [name], [name] Other Clinician: Referring Bransen Fassnacht: Treating Laddie Math/Extender: [name] [place], [place] [name] in Treatment: 14 Vital Signs Time Taken: 14:30 Temperature (F): 98.3 Height (in): 64 Pulse (bpm): 104 Weight (lbs): 166 Respiratory Rate (breaths/min): 18 Body Mass Index (BMI): 28.5 Blood Pressure (mmHg): 110/67 Reference Range: 80 - 120 mg / dl Electronic Signature(s) Signed: 06/27/2023 3:23:37 PM By: Yevonne Pax RN Entered By: Yevonne Pax on 06/27/2023 12:23:37

## 2023-06-28 ENCOUNTER — Encounter: Payer: 59 | Admitting: Internal Medicine

## 2023-06-29 ENCOUNTER — Encounter: Payer: 59 | Admitting: Physician Assistant

## 2023-06-29 ENCOUNTER — Ambulatory Visit: Admit: 2023-06-29 | Discharge: 2023-06-29 | Payer: MEDICARE

## 2023-06-29 ENCOUNTER — Inpatient Hospital Stay: Admit: 2023-06-29 | Discharge: 2023-07-03 | Payer: MEDICARE

## 2023-06-29 DIAGNOSIS — G35 Multiple sclerosis: Principal | ICD-10-CM

## 2023-06-29 DIAGNOSIS — Z1231 Encounter for screening mammogram for malignant neoplasm of breast: Principal | ICD-10-CM

## 2023-06-29 DIAGNOSIS — G2581 Restless legs syndrome: Principal | ICD-10-CM

## 2023-06-29 DIAGNOSIS — R799 Abnormal finding of blood chemistry, unspecified: Principal | ICD-10-CM

## 2023-06-29 DIAGNOSIS — Z1211 Encounter for screening for malignant neoplasm of colon: Principal | ICD-10-CM

## 2023-06-29 DIAGNOSIS — G8220 Paraplegia, unspecified: Principal | ICD-10-CM

## 2023-06-29 DIAGNOSIS — E876 Hypokalemia: Principal | ICD-10-CM

## 2023-06-30 ENCOUNTER — Encounter: Payer: 59 | Admitting: Physician Assistant

## 2023-07-03 ENCOUNTER — Encounter: Payer: 59 | Admitting: Physician Assistant

## 2023-07-03 DIAGNOSIS — M8668 Other chronic osteomyelitis, other site: Secondary | ICD-10-CM | POA: Diagnosis not present

## 2023-07-03 NOTE — Progress Notes (Addendum)
cushion, seat, etc.) INTERVENTIONS - Wound Cleansing / Measurement X - Simple Wound Cleansing - one wound 1 5 []  - 0 Complex Wound Cleansing - multiple wounds X- 1 5 Wound Imaging (photographs - any number of wounds) []  - 0 Wound Tracing (instead of photographs) X- 1 5 Simple Wound Measurement - one wound []  - 0 Complex Wound Measurement - multiple wounds INTERVENTIONS - Wound Dressings X - Small Wound Dressing one or multiple wounds 1 10 []  - 0 Medium Wound Dressing one or multiple wounds []  - 0 Large Wound Dressing one or multiple wounds []  - 0 Application of Medications - topical []  - 0 Application of Medications - injection INTERVENTIONS - Miscellaneous []  - 0 External ear exam [name], [name] ([id number]) [phone].pdf Page 3 of 8 []  - 0 Specimen Collection (cultures, biopsies, blood, body fluids, etc.) []  - 0 Specimen(s) / Culture(s) sent or taken to Lab for analysis X- 1 10 Patient Transfer (multiple staff / [name] / Similar devices) []  - 0 Simple Staple / Suture removal (25 or less) []  - 0 Complex Staple / Suture removal (26 or more) []  - 0 Hypo / Hyperglycemic Management (close monitor of Blood Glucose) []  - 0 Ankle / Brachial Index (ABI) - do not check if billed separately X- 1 5 Vital Signs Has the patient been seen at the hospital within the last three years: Yes Total Score: 85 Level Of Care: New/Established - Level 3 Electronic Signature(s) Signed: 07/24/2023 8:01:26 AM By: [name] Entered By: [name] on 07/03/2023 12:37:12 -------------------------------------------------------------------------------- Encounter Discharge Information Details Patient Name: Date of Service: [name], [name]. 07/03/2023 12:30 PM Medical Record  Number: [id number] Patient Account Number: [id number] Date of Birth/Sex: Treating Pratt: 1971/05/11 (52 y.o. [name] Primary Care [name]: [name] Other Clinician: Referring [name]: Treating [name]: [name] in Treatment: 14 Encounter Discharge Information Items Discharge Condition: Stable Ambulatory Status: Wheelchair Discharge Destination: Home Transportation: Private Auto Accompanied By: caregiver Schedule Follow-up Appointment: Yes Clinical Summary of Care: Electronic Signature(s) Signed: 07/03/2023 1:20:35 PM By: [name] Entered By: [name] on 07/03/2023 10:20:34 -------------------------------------------------------------------------------- Lower Extremity Assessment Details Patient Name: Date of Service: [name], [name]. 07/03/2023 12:30 PM [name] ([id number]) [id number].pdf Page 4 of 8 Medical Record Number: [id number] Patient Account Number: [id number] Date of Birth/Sex: Treating Pratt: May 22, 1971 (52 y.o. [name] Primary Care [place] Franson: [name] Other Clinician: Referring Aniyah Nobis: Treating Janis Sol/Extender: [name] in Treatment: 14 Electronic Signature(s) Signed: 07/03/2023 12:49:31 PM By: [name] Entered By: [name] on 07/03/2023 09:49:31 -------------------------------------------------------------------------------- Multi Wound Chart Details Patient Name: Date of Service: [name], [name]. 07/03/2023 12:30 PM Medical Record Number: [id number] Patient Account Number: [id number] Date of Birth/Sex: Treating Pratt: 04-Jul-1971 (52 y.o. [name] Primary Care Clavin Ruhlman: [name], [name] Other Clinician: Referring Ellison Leisure: Treating Assia Meanor/Extender: [name] in Treatment: 14 Vital Signs Height(in): 64 Pulse(bpm): 94 Weight(lbs): 166 Blood Pressure(mmHg): 157/99 Body Mass  Index(BMI): 28.5 Temperature(F): 98.2 Respiratory Rate(breaths/min): 16 [1:Photos:] [N/A:N/A] Left Gluteus N/A N/A Wound Location: Blister N/A N/A Wounding Event: Pressure Ulcer N/A N/A Primary Etiology: Neuropathy N/A N/A Comorbid History: 03/07/2023 N/A N/A Date Acquired: 14 N/A N/A Weeks of Treatment: Open N/A N/A Wound Status: No N/A N/A Wound Recurrence: 1x5x3 N/A N/A Measurements Pratt x W x D (cm) 3.927 N/A N/A A (cm) : rea  [name] ([id number]) [phone].pdf Page 1 of 8 Visit Report for 07/03/2023 Arrival Information Details Patient Name: Date of Service: [name], Pratt 07/03/2023 12:30 PM Medical Record Number: [id number] Patient Account Number: [id number] Date of Birth/Sex: Treating Pratt: [birth year] (52 y.o. [name] Primary Care Lylie Blacklock: [name] Other Clinician: Referring [name]: Treating [name]: [name] in Treatment: 14 Visit Information History Since Last Visit Added or deleted any medications: No Patient Arrived: Wheel Chair Any new allergies or adverse reactions: No Arrival Time: 12:33 Had a fall or experienced change in No Accompanied By: caregiver activities of daily living that may affect Transfer Assistance: None risk of falls: Patient Identification Verified: Yes Signs or symptoms of abuse/neglect since last visito No Secondary Verification Process Completed: Yes Hospitalized since last visit: No Patient Requires Transmission-Based Precautions: No Implantable device outside of the clinic excluding No Patient Has Alerts: Yes cellular tissue based products placed in the center Patient Alerts: NOT DIABETIC since last visit: Has Dressing in Place as Prescribed: Yes Pain Present Now: No Electronic Signature(s) Signed: 07/03/2023 12:48:07 PM By: [name] Entered By: [name] on 07/03/2023 09:48:06 -------------------------------------------------------------------------------- Clinic Level of Care Assessment Details Patient Name: Date of Service: [name] 07/03/2023 12:30 PM Medical Record Number: [id number] Patient Account Number: [id number] Date of Birth/Sex: Treating Pratt: July 27, 1971 (52 y.o. [name] Primary Care Hassaan Crite: [name] Other Clinician: Referring Shavone Nevers: Treating [name]/Extender: [name] in Treatment:  14 Clinic Level of Care Assessment Items TOOL 4 Quantity Score X- 1 0 Use when only an EandM is performed on FOLLOW-UP visit ASSESSMENTS - Nursing Assessment / Reassessment X- 1 10 Reassessment of Co-morbidities (includes updates in patient status) X- 1 5 Reassessment of Adherence to Treatment Plan GINNI, IXTA ([id number]) [phone].pdf Page 2 of 8 ASSESSMENTS - Wound and Skin A ssessment / Reassessment X - Simple Wound Assessment / Reassessment - one wound 1 5 []  - 0 Complex Wound Assessment / Reassessment - multiple wounds []  - 0 Dermatologic / Skin Assessment (not related to wound area) ASSESSMENTS - Focused Assessment []  - 0 Circumferential Edema Measurements - multi extremities []  - 0 Nutritional Assessment / Counseling / Intervention []  - 0 Lower Extremity Assessment (monofilament, tuning fork, pulses) []  - 0 Peripheral Arterial Disease Assessment (using hand held doppler) ASSESSMENTS - Ostomy and/or Continence Assessment and Care []  - 0 Incontinence Assessment and Management []  - 0 Ostomy Care Assessment and Management (repouching, etc.) PROCESS - Coordination of Care X - Simple Patient / Family Education for ongoing care 1 15 []  - 0 Complex (extensive) Patient / Family Education for ongoing care []  - 0 Staff obtains Chiropractor, Records, T Results / Process Orders est []  - 0 Staff telephones HHA, Nursing Homes / Clarify orders / etc []  - 0 Routine Transfer to another Facility (non-emergent condition) []  - 0 Routine Hospital Admission (non-emergent condition) []  - 0 New Admissions / Manufacturing engineer / Ordering NPWT Apligraf, etc. , []  - 0 Emergency Hospital Admission (emergent condition) X- 1 10 Simple Discharge Coordination []  - 0 Complex (extensive) Discharge Coordination PROCESS - Special Needs []  - 0 Pediatric / Minor Patient Management []  - 0 Isolation Patient Management []  - 0 Hearing / Language / Visual special  needs []  - 0 Assessment of Community assistance (transportation, D/C planning, etc.) []  - 0 Additional assistance / Altered mentation []  - 0 Support Surface(s) Assessment (bed,  cushion, seat, etc.) INTERVENTIONS - Wound Cleansing / Measurement X - Simple Wound Cleansing - one wound 1 5 []  - 0 Complex Wound Cleansing - multiple wounds X- 1 5 Wound Imaging (photographs - any number of wounds) []  - 0 Wound Tracing (instead of photographs) X- 1 5 Simple Wound Measurement - one wound []  - 0 Complex Wound Measurement - multiple wounds INTERVENTIONS - Wound Dressings X - Small Wound Dressing one or multiple wounds 1 10 []  - 0 Medium Wound Dressing one or multiple wounds []  - 0 Large Wound Dressing one or multiple wounds []  - 0 Application of Medications - topical []  - 0 Application of Medications - injection INTERVENTIONS - Miscellaneous []  - 0 External ear exam [name], [name] ([id number]) [phone].pdf Page 3 of 8 []  - 0 Specimen Collection (cultures, biopsies, blood, body fluids, etc.) []  - 0 Specimen(s) / Culture(s) sent or taken to Lab for analysis X- 1 10 Patient Transfer (multiple staff / [name] / Similar devices) []  - 0 Simple Staple / Suture removal (25 or less) []  - 0 Complex Staple / Suture removal (26 or more) []  - 0 Hypo / Hyperglycemic Management (close monitor of Blood Glucose) []  - 0 Ankle / Brachial Index (ABI) - do not check if billed separately X- 1 5 Vital Signs Has the patient been seen at the hospital within the last three years: Yes Total Score: 85 Level Of Care: New/Established - Level 3 Electronic Signature(s) Signed: 07/24/2023 8:01:26 AM By: [name] Entered By: [name] on 07/03/2023 12:37:12 -------------------------------------------------------------------------------- Encounter Discharge Information Details Patient Name: Date of Service: [name], [name]. 07/03/2023 12:30 PM Medical Record  Number: [id number] Patient Account Number: [id number] Date of Birth/Sex: Treating Pratt: 1971/05/11 (52 y.o. [name] Primary Care [name]: [name] Other Clinician: Referring [name]: Treating [name]: [name] in Treatment: 14 Encounter Discharge Information Items Discharge Condition: Stable Ambulatory Status: Wheelchair Discharge Destination: Home Transportation: Private Auto Accompanied By: caregiver Schedule Follow-up Appointment: Yes Clinical Summary of Care: Electronic Signature(s) Signed: 07/03/2023 1:20:35 PM By: [name] Entered By: [name] on 07/03/2023 10:20:34 -------------------------------------------------------------------------------- Lower Extremity Assessment Details Patient Name: Date of Service: [name], [name]. 07/03/2023 12:30 PM [name] ([id number]) [id number].pdf Page 4 of 8 Medical Record Number: [id number] Patient Account Number: [id number] Date of Birth/Sex: Treating Pratt: May 22, 1971 (52 y.o. [name] Primary Care [place] Franson: [name] Other Clinician: Referring Aniyah Nobis: Treating Janis Sol/Extender: [name] in Treatment: 14 Electronic Signature(s) Signed: 07/03/2023 12:49:31 PM By: [name] Entered By: [name] on 07/03/2023 09:49:31 -------------------------------------------------------------------------------- Multi Wound Chart Details Patient Name: Date of Service: [name], [name]. 07/03/2023 12:30 PM Medical Record Number: [id number] Patient Account Number: [id number] Date of Birth/Sex: Treating Pratt: 04-Jul-1971 (52 y.o. [name] Primary Care Clavin Ruhlman: [name], [name] Other Clinician: Referring Ellison Leisure: Treating Assia Meanor/Extender: [name] in Treatment: 14 Vital Signs Height(in): 64 Pulse(bpm): 94 Weight(lbs): 166 Blood Pressure(mmHg): 157/99 Body Mass  Index(BMI): 28.5 Temperature(F): 98.2 Respiratory Rate(breaths/min): 16 [1:Photos:] [N/A:N/A] Left Gluteus N/A N/A Wound Location: Blister N/A N/A Wounding Event: Pressure Ulcer N/A N/A Primary Etiology: Neuropathy N/A N/A Comorbid History: 03/07/2023 N/A N/A Date Acquired: 14 N/A N/A Weeks of Treatment: Open N/A N/A Wound Status: No N/A N/A Wound Recurrence: 1x5x3 N/A N/A Measurements Pratt x W x D (cm) 3.927 N/A N/A A (cm) : rea  Explain/Verbal Responses: State content correctly Electronic Signature(s) Signed: 07/24/2023 8:01:26 AM By: [name] Entered By: [name] on 07/03/2023 09:51:32 -------------------------------------------------------------------------------- Wound Assessment Details Patient Name: Date of Service: [name], [name] 07/03/2023 12:30 PM Medical Record Number: [id number] Patient Account Number: [id number] Date of Birth/Sex: Treating Pratt: 1971/02/05 (52 y.o. [name] Primary Care Roena Sassaman: [name], [name] Other Clinician: Referring Ritika Hellickson: Treating Romelia Bromell/Extender: [name] in Treatment: 14 Wound Status Wound Number: 1 Primary Etiology: Pressure Ulcer Wound  Location: Left Gluteus Wound Status: Open Wounding Event: Blister Comorbid History: Neuropathy Date Acquired: 03/07/2023 Weeks Of Treatment: 14 Clustered Wound: No Photos Wound Measurements Length: (cm) 1 Width: (cm) 0.5 Depth: (cm) 3 Area: (cm) 0.393 Volume: (cm) 1.178 [name], [name] ([id number]) % Reduction in Area: 72.2% % Reduction in Volume: 60.3% Epithelialization: None Tunneling: Yes Position (o'clock): 1 Maximum Distance: (cm) 5.7 [id number] Page 8 of 8 Undermining: No Wound Description Classification: Category/Stage IV Exudate Amount: Medium Exudate Type: Serosanguineous Exudate Color: red, brown Foul Odor After Cleansing: No Slough/Fibrino No Wound Bed Granulation Amount: Large (67-100%) Exposed Structure Granulation Quality: Red Fascia Exposed: No Necrotic Amount: None Present (0%) Fat Layer (Subcutaneous Tissue) Exposed: Yes Tendon Exposed: No Muscle Exposed: No Joint Exposed: No Bone Exposed: No Electronic Signature(s) Signed: 07/24/2023 8:01:26 AM By: [name] Previous Signature: 07/03/2023 12:49:15 PM Version By: [name] Entered By: [name] on 07/10/2023 10:20:44 -------------------------------------------------------------------------------- Vitals Details Patient Name: Date of Service: [name], [name]. 07/03/2023 12:30 PM Medical Record Number: [id number] Patient Account Number: [id number] Date of Birth/Sex: Treating Pratt: 09/17/1971 (52 y.o. [name] Primary Care Burley Kopka: [name], [name] Other Clinician: Referring Aadi Bordner: Treating Tramon Crescenzo/Extender: [name] in Treatment: 14 Vital Signs Time Taken: 12:34 Temperature (F): 98.2 Height (in): 64 Pulse (bpm): 94 Weight (lbs): 166 Respiratory Rate (breaths/min): 16 Body Mass Index (BMI): 28.5 Blood Pressure (mmHg): 157/99 Reference Range: 80 - 120 mg / dl Electronic Signature(s) Signed: 07/03/2023  12:48:25 PM By: [name] Entered By: [name] on 07/03/2023 09:48:25

## 2023-07-03 NOTE — Progress Notes (Addendum)
[id number] Date of Birth/Sex: Treating RN: 1970-11-20 (52 y.o. [name] Primary Care Provider: Bertram Denver Other Clinician: Referring Provider: Treating Provider/Extender: [name] GO [place]K, [place] [name] in Treatment: [address], [place] ([id number]) [id number] Page 3 of 8 Constitutional Obese and well-hydrated in no acute distress. Respiratory normal breathing without difficulty. Psychiatric this patient is able to make decisions and demonstrates good insight into disease process. Alert and Oriented x 3. pleasant and cooperative. Notes Upon inspection patient's wound bed actually showed signs of still having an fairly deep open wound. I do believe that the Prisma still probably one of her best way to go or try to do what we can to get this area to feeling. Fortunately there does not appear to be any signs of infection locally or systemically which is great news. Electronic Signature(s) Signed: 07/03/2023 1:12:01 PM  By: [name] PA-C Entered By: [name] on 07/03/2023 10:12:01 -------------------------------------------------------------------------------- Physician Orders Details Patient Name: Date of Service: [name], [name] 07/03/2023 12:30 PM Medical Record Number: [id number] Patient Account Number: [id number] Date of Birth/Sex: Treating RN: [birth year] (52 y.o. 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 Home Health Home Health Company: - [facility] fax: [phone] [facility] for wound care. May utilize formulary equivalent dressing for wound treatment orders unless otherwise specified. Home Health Nurse may visit PRN to address patients wound care needs. Scheduled days for dressing changes to be completed; exception, patient has scheduled wound care visit that day. - Wednesday, Friday **Please direct any NON-WOUND related issues/requests for orders to patient's Primary Care Physician. **If current dressing causes regression in wound condition, may D/C ordered dressing product/s and apply Normal Saline Moist Dressing daily until next Wound Healing Center or Other MD appointment. **Notify Wound Healing Center of regression in wound condition at [phone]. Bathing/ [employer] wounds with antibacterial soap and water. Off-Loading Roho cushion for wheelchair Hospital bed/mattress A fluidized (Group 3) - Home Health, please order for patient. ir Turn and reposition every 2 hours - Float heels on pillow when in the bed. Additional Orders / Instructions Follow Nutritious Diet  and Increase Protein Intake Hyperbaric Oxygen Therapy Evaluate for HBO Therapy Indication and location: - osteomylitis left ishium TOMORROW, DEHAAS ([id number]) [id number] Page 4 of 8 If appropriate for treatment, begin HBOT per protocol: 2.0 ATA for 90 Minutes without A Breaks ir One treatment per day (delivered Monday through Friday unless otherwise specified in Special Instructions below): Total # of Treatments: - 40 A ntihistamine 30 minutes prior to HBO Treatment, difficulty clearing ears. Wound Treatment Wound #1 - Gluteus Wound Laterality: Left Cleanser: Soap and Water (Home Health) 3 x Per Week/30 Days Discharge Instructions: Gently cleanse wound with antibacterial soap, rinse and pat dry prior to dressing wounds Prim Dressing: Prisma 4.34 (in) 3 x Per Week/30 Days ary Discharge Instructions: tuck into undermined area dry Secondary Dressing: ABD Pad 5x9 (in/in) 3 x Per Week/30 Days Discharge Instructions: Cover with ABD pad Secondary Dressing: hydrofera blue rope 3 x Per Week/30 Days Discharge Instructions: place into wound dry and up and into tunnel Secured With: Medipore T - 55M Medipore H Soft Cloth Surgical  [id number] Date of Birth/Sex: Treating RN: 1970-11-20 (52 y.o. [name] Primary Care Provider: Bertram Denver Other Clinician: Referring Provider: Treating Provider/Extender: [name] GO [place], [place] [name] in Treatment: [address], [place] ([id number]) [id number] Page 3 of 8 Constitutional Obese and well-hydrated in no acute distress. Respiratory normal breathing without difficulty. Psychiatric this patient is able to make decisions and demonstrates good insight into disease process. Alert and Oriented x 3. pleasant and cooperative. Notes Upon inspection patient's wound bed actually showed signs of still having an fairly deep open wound. I do believe that the Prisma still probably one of her best way to go or try to do what we can to get this area to feeling. Fortunately there does not appear to be any signs of infection locally or systemically which is great news. Electronic Signature(s) Signed: 07/03/2023 1:12:01 PM  By: [name] PA-C Entered By: [name] on 07/03/2023 10:12:01 -------------------------------------------------------------------------------- Physician Orders Details Patient Name: Date of Service: [name], [name] 07/03/2023 12:30 PM Medical Record Number: [id number] Patient Account Number: [id number] Date of Birth/Sex: Treating RN: [birth year] (52 y.o. 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 Home Health Home Health Company: - [facility] fax: [phone] [facility] for wound care. May utilize formulary equivalent dressing for wound treatment orders unless otherwise specified. Home Health Nurse may visit PRN to address patients wound care needs. Scheduled days for dressing changes to be completed; exception, patient has scheduled wound care visit that day. - Wednesday, Friday **Please direct any NON-WOUND related issues/requests for orders to patient's Primary Care Physician. **If current dressing causes regression in wound condition, may D/C ordered dressing product/s and apply Normal Saline Moist Dressing daily until next Wound Healing Center or Other MD appointment. **Notify Wound Healing Center of regression in wound condition at [phone]. Bathing/ [employer] wounds with antibacterial soap and water. Off-Loading Roho cushion for wheelchair Hospital bed/mattress A fluidized (Group 3) - Home Health, please order for patient. ir Turn and reposition every 2 hours - Float heels on pillow when in the bed. Additional Orders / Instructions Follow Nutritious Diet  and Increase Protein Intake Hyperbaric Oxygen Therapy Evaluate for HBO Therapy Indication and location: - osteomylitis left ishium TOMORROW, DEHAAS ([id number]) [id number] Page 4 of 8 If appropriate for treatment, begin HBOT per protocol: 2.0 ATA for 90 Minutes without A Breaks ir One treatment per day (delivered Monday through Friday unless otherwise specified in Special Instructions below): Total # of Treatments: - 40 A ntihistamine 30 minutes prior to HBO Treatment, difficulty clearing ears. Wound Treatment Wound #1 - Gluteus Wound Laterality: Left Cleanser: Soap and Water (Home Health) 3 x Per Week/30 Days Discharge Instructions: Gently cleanse wound with antibacterial soap, rinse and pat dry prior to dressing wounds Prim Dressing: Prisma 4.34 (in) 3 x Per Week/30 Days ary Discharge Instructions: tuck into undermined area dry Secondary Dressing: ABD Pad 5x9 (in/in) 3 x Per Week/30 Days Discharge Instructions: Cover with ABD pad Secondary Dressing: hydrofera blue rope 3 x Per Week/30 Days Discharge Instructions: place into wound dry and up and into tunnel Secured With: Medipore T - 55M Medipore H Soft Cloth Surgical  [id number] Date of Birth/Sex: Treating RN: 1970-11-20 (52 y.o. [name] Primary Care Provider: Bertram Denver Other Clinician: Referring Provider: Treating Provider/Extender: [name] [place], [place] [name] in Treatment: [address], [place] ([id number]) [id number] Page 3 of 8 Constitutional Obese and well-hydrated in no acute distress. Respiratory normal breathing without difficulty. Psychiatric this patient is able to make decisions and demonstrates good insight into disease process. Alert and Oriented x 3. pleasant and cooperative. Notes Upon inspection patient's wound bed actually showed signs of still having an fairly deep open wound. I do believe that the Prisma still probably one of her best way to go or try to do what we can to get this area to feeling. Fortunately there does not appear to be any signs of infection locally or systemically which is great news. Electronic Signature(s) Signed: 07/03/2023 1:12:01 PM  By: [name] PA-C Entered By: [name] on 07/03/2023 10:12:01 -------------------------------------------------------------------------------- Physician Orders Details Patient Name: Date of Service: [name], [name] 07/03/2023 12:30 PM Medical Record Number: [id number] Patient Account Number: [id number] Date of Birth/Sex: Treating RN: [birth year] (52 y.o. 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 Home Health Home Health Company: - [facility] fax: [phone] [facility] for wound care. May utilize formulary equivalent dressing for wound treatment orders unless otherwise specified. Home Health Nurse may visit PRN to address patients wound care needs. Scheduled days for dressing changes to be completed; exception, patient has scheduled wound care visit that day. - Wednesday, Friday **Please direct any NON-WOUND related issues/requests for orders to patient's Primary Care Physician. **If current dressing causes regression in wound condition, may D/C ordered dressing product/s and apply Normal Saline Moist Dressing daily until next Wound Healing Center or Other MD appointment. **Notify Wound Healing Center of regression in wound condition at [phone]. Bathing/ [employer] wounds with antibacterial soap and water. Off-Loading Roho cushion for wheelchair Hospital bed/mattress A fluidized (Group 3) - Home Health, please order for patient. ir Turn and reposition every 2 hours - Float heels on pillow when in the bed. Additional Orders / Instructions Follow Nutritious Diet  and Increase Protein Intake Hyperbaric Oxygen Therapy Evaluate for HBO Therapy Indication and location: - osteomylitis left ishium TOMORROW, DEHAAS ([id number]) [id number] Page 4 of 8 If appropriate for treatment, begin HBOT per protocol: 2.0 ATA for 90 Minutes without A Breaks ir One treatment per day (delivered Monday through Friday unless otherwise specified in Special Instructions below): Total # of Treatments: - 40 A ntihistamine 30 minutes prior to HBO Treatment, difficulty clearing ears. Wound Treatment Wound #1 - Gluteus Wound Laterality: Left Cleanser: Soap and Water (Home Health) 3 x Per Week/30 Days Discharge Instructions: Gently cleanse wound with antibacterial soap, rinse and pat dry prior to dressing wounds Prim Dressing: Prisma 4.34 (in) 3 x Per Week/30 Days ary Discharge Instructions: tuck into undermined area dry Secondary Dressing: ABD Pad 5x9 (in/in) 3 x Per Week/30 Days Discharge Instructions: Cover with ABD pad Secondary Dressing: hydrofera blue rope 3 x Per Week/30 Days Discharge Instructions: place into wound dry and up and into tunnel Secured With: Medipore T - 55M Medipore H Soft Cloth Surgical  pad Secondary Dressing: hydrofera blue rope 3 x Per Week/30 Days Discharge Instructions: place into wound dry and up and into tunnel Secured With: Medipore T - 8M Medipore H Soft Cloth Surgical T ape ape, 2x2 (in/yd) 3 x Per Week/30 Days 1. I would recommend that we have the patient continue to monitor for any evidence of infection or worsening. Based on what I am seeing I do believe that we will make an excellent headway as far as keeping the area nice and clean we need to make sure that the [facility] Blue rope is being utilized and the in the absence of that the [facility] classic would also be a possibility cut into a rope type form. 2. I am would recommend as well the patient should continue with ABD pad to cover. 3. She does want to proceed with hyperbaric oxygen therapy which I think provides the best option for her as far as healing is concerned we just need to figure out the scheduling on this she does have transportation through her insurance. My plan is to start her on HBO therapy on Monday if we can get everything figured out from a scheduling perspective. We will see patient back for reevaluation in 1 week here in the clinic. If anything worsens or changes patient will contact our office for additional recommendations. Electronic Signature(s) Signed: 07/03/2023 1:13:37 PM By: [name] PA-C Previous Signature: 07/03/2023 1:12:56 PM Version By: [name] PA-C Entered By: [name] on 07/03/2023 10:13:37 -------------------------------------------------------------------------------- SuperBill Details Patient Name: Date of Service: [name], [name] 07/03/2023 Medical Record Number: [id number] Patient Account Number:  [id number] Date of Birth/Sex: Treating RN: Jan 16, 1971 (52 y.o. 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, stage 4 G35 Multiple sclerosis Quantity: 1 Electronic Signature(s) Signed: 07/03/2023 3:38:59 PM By: Yevonne Pax RN Signed: 07/03/2023 3:41:41 PM By: [name] PA-C Previous Signature: 07/03/2023 1:19:29 PM Version By: Yevonne Pax RN Previous Signature: 07/03/2023 1:13:45 PM Version By: [name] PA-C Previous Signature: 07/03/2023 1:13:08 PM Version By: [name] PA-C Entered By: Yevonne Pax on 07/03/2023 12:38:58  pad Secondary Dressing: hydrofera blue rope 3 x Per Week/30 Days Discharge Instructions: place into wound dry and up and into tunnel Secured With: Medipore T - 8M Medipore H Soft Cloth Surgical T ape ape, 2x2 (in/yd) 3 x Per Week/30 Days 1. I would recommend that we have the patient continue to monitor for any evidence of infection or worsening. Based on what I am seeing I do believe that we will make an excellent headway as far as keeping the area nice and clean we need to make sure that the [facility] Blue rope is being utilized and the in the absence of that the [facility] classic would also be a possibility cut into a rope type form. 2. I am would recommend as well the patient should continue with ABD pad to cover. 3. She does want to proceed with hyperbaric oxygen therapy which I think provides the best option for her as far as healing is concerned we just need to figure out the scheduling on this she does have transportation through her insurance. My plan is to start her on HBO therapy on Monday if we can get everything figured out from a scheduling perspective. We will see patient back for reevaluation in 1 week here in the clinic. If anything worsens or changes patient will contact our office for additional recommendations. Electronic Signature(s) Signed: 07/03/2023 1:13:37 PM By: [name] PA-C Previous Signature: 07/03/2023 1:12:56 PM Version By: [name] PA-C Entered By: [name] on 07/03/2023 10:13:37 -------------------------------------------------------------------------------- SuperBill Details Patient Name: Date of Service: [name], [name] 07/03/2023 Medical Record Number: [id number] Patient Account Number:  [id number] Date of Birth/Sex: Treating RN: Jan 16, 1971 (52 y.o. 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, stage 4 G35 Multiple sclerosis Quantity: 1 Electronic Signature(s) Signed: 07/03/2023 3:38:59 PM By: Yevonne Pax RN Signed: 07/03/2023 3:41:41 PM By: [name] PA-C Previous Signature: 07/03/2023 1:19:29 PM Version By: Yevonne Pax RN Previous Signature: 07/03/2023 1:13:45 PM Version By: [name] PA-C Previous Signature: 07/03/2023 1:13:08 PM Version By: [name] PA-C Entered By: Yevonne Pax on 07/03/2023 12:38:58  T ape ape, 2x2 (in/yd) 3 x Per Week/30 Days Electronic Signature(s) Signed: 07/03/2023 12:51:08 PM By: Yevonne Pax RN Signed: 07/03/2023 3:41:41 PM By: [name] PA-C Entered By: Yevonne Pax on 07/03/2023 09:51:07 -------------------------------------------------------------------------------- Problem List Details Patient Name: Date of Service: MO [name] RE, Sagal L. 07/03/2023 12:30 PM Medical Record Number: [id number] Patient Account Number: [id number] Date of Birth/Sex: Treating RN: 03-14-1971 (52 y.o. 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 07/03/2023 12:30 PM Medical Record Number: [id number] Patient Account Number: [id number] Date of Birth/Sex: Treating RN: Jun 24, 1971 (52 y.o. [name] Primary Care Provider: [name], [name] Other Clinician: Referring Provider: Treating Provider/Extender: [name] [place], [place] [name] in Treatment: 14 Subjective Chief Complaint Information obtained from Patient Pressure ulcer left gluteal region History of Present Illness (HPI) 03-21-2023 upon evaluation today patient appears for initial evaluation regarding a wound that actually began around 03/07/2023. With that being said she has been having issues here with this area and then subsequently ended up in the hospital due to a high fever. She was discharged from the hospital and then started on oral antibiotics that was on 03-16-2023. The oral antibiotics are doxycycline and Keflex that she is currently taking. With that being said she was diagnosed with multiple sclerosis in 2000 [name] she has been in a well motorized wheelchair since 2014. She is a pleasant individual and unfortunately this is the issue that she is having currently with a pressure ulcer although this is the good news is it is the first time she has had this happen she has an excellent caregiver that takes great care of her and the family is very attentive. 03-28-2023 upon  evaluation today patient appears to be doing worse with regard to the irritation around the edges of her wound. The good news is the wound itself does not appear worse but the irritation is definitely not as good as even what I saw last week. In the interim since last week [name] her aide has actually broken her wrist and is no longer helping with the dressing changes I think this is part of the issue here. Fortunately I do not see any evidence of infection locally or systemically which is good news. 04-04-2023 upon evaluation today patient's wounds are actually showing signs of significant improvement which is great news. Fortunately I do not see any evidence of worsening overall I think that her family is making excellent progress towards getting this closed she is doing a good job offloading we will try to get the air-fluidized bed but that is just not working out her insurance at this point is denying this. She does have an alternating air mattress at home although it has been apparently on static mode I did advise this to be switched to alternating mode. 04-11-2023 upon evaluation today patient appears to be doing better in regard to her wound. She has been tolerating the  T ape ape, 2x2 (in/yd) 3 x Per Week/30 Days Electronic Signature(s) Signed: 07/03/2023 12:51:08 PM By: Yevonne Pax RN Signed: 07/03/2023 3:41:41 PM By: [name] PA-C Entered By: Yevonne Pax on 07/03/2023 09:51:07 -------------------------------------------------------------------------------- Problem List Details Patient Name: Date of Service: MO [name] RE, Sagal L. 07/03/2023 12:30 PM Medical Record Number: [id number] Patient Account Number: [id number] Date of Birth/Sex: Treating RN: 03-14-1971 (52 y.o. 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 07/03/2023 12:30 PM Medical Record Number: [id number] Patient Account Number: [id number] Date of Birth/Sex: Treating RN: Jun 24, 1971 (52 y.o. [name] Primary Care Provider: [name], [name] Other Clinician: Referring Provider: Treating Provider/Extender: [name] GO [place], [place] [name] in Treatment: 14 Subjective Chief Complaint Information obtained from Patient Pressure ulcer left gluteal region History of Present Illness (HPI) 03-21-2023 upon evaluation today patient appears for initial evaluation regarding a wound that actually began around 03/07/2023. With that being said she has been having issues here with this area and then subsequently ended up in the hospital due to a high fever. She was discharged from the hospital and then started on oral antibiotics that was on 03-16-2023. The oral antibiotics are doxycycline and Keflex that she is currently taking. With that being said she was diagnosed with multiple sclerosis in 2000 [name] she has been in a well motorized wheelchair since 2014. She is a pleasant individual and unfortunately this is the issue that she is having currently with a pressure ulcer although this is the good news is it is the first time she has had this happen she has an excellent caregiver that takes great care of her and the family is very attentive. 03-28-2023 upon  evaluation today patient appears to be doing worse with regard to the irritation around the edges of her wound. The good news is the wound itself does not appear worse but the irritation is definitely not as good as even what I saw last week. In the interim since last week [name] her aide has actually broken her wrist and is no longer helping with the dressing changes I think this is part of the issue here. Fortunately I do not see any evidence of infection locally or systemically which is good news. 04-04-2023 upon evaluation today patient's wounds are actually showing signs of significant improvement which is great news. Fortunately I do not see any evidence of worsening overall I think that her family is making excellent progress towards getting this closed she is doing a good job offloading we will try to get the air-fluidized bed but that is just not working out her insurance at this point is denying this. She does have an alternating air mattress at home although it has been apparently on static mode I did advise this to be switched to alternating mode. 04-11-2023 upon evaluation today patient appears to be doing better in regard to her wound. She has been tolerating the  pad Secondary Dressing: hydrofera blue rope 3 x Per Week/30 Days Discharge Instructions: place into wound dry and up and into tunnel Secured With: Medipore T - 8M Medipore H Soft Cloth Surgical T ape ape, 2x2 (in/yd) 3 x Per Week/30 Days 1. I would recommend that we have the patient continue to monitor for any evidence of infection or worsening. Based on what I am seeing I do believe that we will make an excellent headway as far as keeping the area nice and clean we need to make sure that the [facility] Blue rope is being utilized and the in the absence of that the [facility] classic would also be a possibility cut into a rope type form. 2. I am would recommend as well the patient should continue with ABD pad to cover. 3. She does want to proceed with hyperbaric oxygen therapy which I think provides the best option for her as far as healing is concerned we just need to figure out the scheduling on this she does have transportation through her insurance. My plan is to start her on HBO therapy on Monday if we can get everything figured out from a scheduling perspective. We will see patient back for reevaluation in 1 week here in the clinic. If anything worsens or changes patient will contact our office for additional recommendations. Electronic Signature(s) Signed: 07/03/2023 1:13:37 PM By: [name] PA-C Previous Signature: 07/03/2023 1:12:56 PM Version By: [name] PA-C Entered By: [name] on 07/03/2023 10:13:37 -------------------------------------------------------------------------------- SuperBill Details Patient Name: Date of Service: [name], [name] 07/03/2023 Medical Record Number: [id number] Patient Account Number:  [id number] Date of Birth/Sex: Treating RN: Jan 16, 1971 (52 y.o. 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 [name] Primary Care Provider: Bertram Denver Other Clinician: Referring Provider: Treating Provider/Extender: [name] [place], [place] [name] in Treatment: [address], [place]zy L ([id number]) [id number] Page 3 of 8 Constitutional Obese and well-hydrated in no acute distress. Respiratory normal breathing without difficulty. Psychiatric this patient is able to make decisions and demonstrates good insight into disease process. Alert and Oriented x 3. pleasant and cooperative. Notes Upon inspection patient's wound bed actually showed signs of still having an fairly deep open wound. I do believe that the Prisma still probably one of her best way to go or try to do what we can to get this area to feeling. Fortunately there does not appear to be any signs of infection locally or systemically which is great news. Electronic Signature(s) Signed: 07/03/2023 1:12:01 PM  By: [name] PA-C Entered By: [name] on 07/03/2023 10:12:01 -------------------------------------------------------------------------------- Physician Orders Details Patient Name: Date of Service: [name], [name] 07/03/2023 12:30 PM Medical Record Number: [id number] Patient Account Number: [id number] Date of Birth/Sex: Treating RN: [birth year] (52 y.o. 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 Home Health Home Health Company: - [facility] fax: [phone] [facility] for wound care. May utilize formulary equivalent dressing for wound treatment orders unless otherwise specified. Home Health Nurse may visit PRN to address patients wound care needs. Scheduled days for dressing changes to be completed; exception, patient has scheduled wound care visit that day. - Wednesday, Friday **Please direct any NON-WOUND related issues/requests for orders to patient's Primary Care Physician. **If current dressing causes regression in wound condition, may D/C ordered dressing product/s and apply Normal Saline Moist Dressing daily until next Wound Healing Center or Other MD appointment. **Notify Wound Healing Center of regression in wound condition at [phone]. Bathing/ [employer] wounds with antibacterial soap and water. Off-Loading Roho cushion for wheelchair Hospital bed/mattress A fluidized (Group 3) - Home Health, please order for patient. ir Turn and reposition every 2 hours - Float heels on pillow when in the bed. Additional Orders / Instructions Follow Nutritious Diet  and Increase Protein Intake Hyperbaric Oxygen Therapy Evaluate for HBO Therapy Indication and location: - osteomylitis left ishium TOMORROW, DEHAAS ([id number]) [id number] Page 4 of 8 If appropriate for treatment, begin HBOT per protocol: 2.0 ATA for 90 Minutes without A Breaks ir One treatment per day (delivered Monday through Friday unless otherwise specified in Special Instructions below): Total # of Treatments: - 40 A ntihistamine 30 minutes prior to HBO Treatment, difficulty clearing ears. Wound Treatment Wound #1 - Gluteus Wound Laterality: Left Cleanser: Soap and Water (Home Health) 3 x Per Week/30 Days Discharge Instructions: Gently cleanse wound with antibacterial soap, rinse and pat dry prior to dressing wounds Prim Dressing: Prisma 4.34 (in) 3 x Per Week/30 Days ary Discharge Instructions: tuck into undermined area dry Secondary Dressing: ABD Pad 5x9 (in/in) 3 x Per Week/30 Days Discharge Instructions: Cover with ABD pad Secondary Dressing: hydrofera blue rope 3 x Per Week/30 Days Discharge Instructions: place into wound dry and up and into tunnel Secured With: Medipore T - 55M Medipore H Soft Cloth Surgical

## 2023-07-04 ENCOUNTER — Encounter: Payer: 59 | Admitting: Physician Assistant

## 2023-07-05 ENCOUNTER — Encounter: Payer: 59 | Admitting: Internal Medicine

## 2023-07-06 ENCOUNTER — Encounter: Payer: 59 | Admitting: Physician Assistant

## 2023-07-07 ENCOUNTER — Encounter: Payer: 59 | Admitting: Physician Assistant

## 2023-07-10 ENCOUNTER — Encounter: Payer: 59 | Admitting: Physician Assistant

## 2023-07-10 DIAGNOSIS — M8668 Other chronic osteomyelitis, other site: Secondary | ICD-10-CM | POA: Diagnosis not present

## 2023-07-10 NOTE — Progress Notes (Signed)
ELECIA, SERAFIN ([id number]) [phone].pdf Page 1 of 2 Visit Report for 07/10/2023 Arrival Information Details Patient Name: Date of Service: SONAL, DORWART 07/10/2023 10:00 A M Medical Record Number: [id number] Patient Account Number: [id number] Date of Birth/Sex: Treating RN: May 07, 1971 (52 y.o. F) Primary Care Alishea Beaudin: Bertram Denver Other Clinician: Referring Kyian Obst: Treating Lemmie Vanlanen/Extender: [name] [place], [place] [name] in Treatment: 15 Visit Information History Since Last Visit Added or deleted any medications: No Patient Arrived: Ambulatory Any new allergies or adverse reactions: No Arrival Time: 10:00 Had a fall or experienced change in No Accompanied By: self activities of daily living that may affect Transfer Assistance: Michiel Sites Lift risk of falls: Patient Identification Verified: Yes Signs or symptoms of abuse/neglect since last visito No Secondary Verification Process Completed: Yes Hospitalized since last visit: No Patient Requires Transmission-Based Precautions: No Implantable device outside of the clinic excluding No Patient Has Alerts: Yes cellular tissue based products placed in the center Patient Alerts: NOT DIABETIC since last visit: Pain Present Now: No Electronic Signature(s) Signed: 07/10/2023 3:53:32 PM By: Demetria Pore Entered By: Demetria Pore on 07/10/2023 09:26:09 -------------------------------------------------------------------------------- Encounter Discharge Information Details Patient Name: Date of Service: [name], Janett L. 07/10/2023 10:00 A M Medical Record Number: [id number] Patient Account Number: [id number] Date of Birth/Sex: Treating RN: 1971/09/20 (52 y.o. F) Primary Care Larson Limones: Bertram Denver Other Clinician: Referring Velicia Dejager: Treating Kristapher [place]/Extender: [name] [place], [place] [name] in Treatment: 15 Encounter Discharge Information Items Discharge Condition:  Stable Ambulatory Status: Other Discharge Destination: Home Transportation: Private Auto Accompanied By: self Schedule Follow-up Appointment: No Clinical Summary of Care: NANCE, MCCOMBS ([id number]) [phone].pdf Page 2 of 2 Electronic Signature(s) Signed: 07/10/2023 3:53:32 PM By: Demetria Pore Entered By: Demetria Pore on 07/10/2023 09:27:06 -------------------------------------------------------------------------------- Vitals Details Patient Name: Date of Service: [name], Avantika L. 07/10/2023 10:00 A M Medical Record Number: [id number] Patient Account Number: [id number] Date of Birth/Sex: Treating RN: 1970-12-24 (52 y.o. F) Primary Care Narada Uzzle: Bertram Denver Other Clinician: Referring Chanice Brenton: Treating Chela Sutphen/Extender: [name] [place], [place] [name] in Treatment: 15 Vital Signs Time Taken: 10:00 Temperature (F): 98.0 Height (in): 64 Pulse (bpm): 82 Weight (lbs): 166 Respiratory Rate (breaths/min): 16 Body Mass Index (BMI): 28.5 Blood Pressure (mmHg): 128/80 Reference Range: 80 - 120 mg / dl Airway Pulse Oximetry (%): 98 Electronic Signature(s) Signed: 07/10/2023 3:53:32 PM By: Demetria Pore Entered By: Demetria Pore on 07/10/2023 09:26:12

## 2023-07-10 NOTE — Progress Notes (Addendum)
EBBIE, MATHERLY ([id number]) [id number] Page 1 of 10 Visit Report for 07/10/2023 Arrival Information Details Patient Name: Date of Service: [name], [name] 07/10/2023 12:30 PM Medical Record Number: [id number] Patient Account Number: [id number] Date of Birth/Sex: Treating RN: 1971-09-20 (52 y.o. [name] Primary Care Angeline Trick: Bertram [place] Other Clinician: Referring Antonios Ostrow: Treating Nickola Lenig/Extender: [name] [place] [name] in Treatment: 15 Visit Information History Since Last Visit Added or deleted any medications: No Patient Arrived: Wheel Chair Any new allergies or adverse reactions: No Arrival Time: 12:38 Had a fall or experienced change in No Accompanied By: caregiver activities of daily living that may affect Transfer Assistance: None risk of falls: Patient Identification Verified: Yes Signs or symptoms of abuse/neglect since last visito No Secondary Verification Process Completed: Yes Hospitalized since last visit: No Patient Requires Transmission-Based Precautions: No Implantable device outside of the clinic excluding No Patient Has Alerts: Yes cellular tissue based products placed in the center Patient Alerts: NOT DIABETIC since last visit: Has Dressing in Place as Prescribed: Yes Pain Present Now: No Electronic Signature(s) Signed: 07/10/2023 12:39:12 PM By: Yevonne Pax RN Entered By: Yevonne Pax on 07/10/2023 12:39:12 -------------------------------------------------------------------------------- Clinic Level of Care Assessment Details Patient Name: Date of Service: [name], [name] 07/10/2023 12:30 PM Medical Record Number: [id number] Patient Account Number: [id number] Date of Birth/Sex: Treating RN: May 02, 1971 (52 y.o. [name] Primary Care Barth Trella: Bertram Denver Other Clinician: Referring Kalev Temme: Treating Greenly Rarick/Extender: [name] [place] [name] in Treatment:  15 Clinic Level of Care Assessment Items TOOL 4 Quantity Score X- 1 0 Use when only an EandM is performed on FOLLOW-UP visit ASSESSMENTS - Nursing Assessment / Reassessment X- 1 10 Reassessment of Co-morbidities (includes updates in patient status) X- 1 5 Reassessment of Adherence to Treatment Plan [name], [name] ([id number]) [id number] Page 2 of 10 ASSESSMENTS - Wound and Skin A ssessment / Reassessment X - Simple Wound Assessment / Reassessment - one wound 1 5 []  - 0 Complex Wound Assessment / Reassessment - multiple wounds []  - 0 Dermatologic / Skin Assessment (not related to wound area) ASSESSMENTS - Focused Assessment X- 1 5 Circumferential Edema Measurements - multi extremities X- 1 10 Nutritional Assessment / Counseling / Intervention X- 1 5 Lower Extremity Assessment (monofilament, tuning fork, pulses) X- 1 10 Peripheral Arterial Disease Assessment (using hand held doppler) ASSESSMENTS - Ostomy and/or Continence Assessment and Care []  - 0 Incontinence Assessment and Management []  - 0 Ostomy Care Assessment and Management (repouching, etc.) PROCESS - Coordination of Care X - Simple Patient / Family Education for ongoing care 1 15 []  - 0 Complex (extensive) Patient / Family Education for ongoing care []  - 0 Staff obtains Chiropractor, Records, T Results / Process Orders est []  - 0 Staff telephones HHA, Nursing Homes / Clarify orders / etc []  - 0 Routine Transfer to another Facility (non-emergent condition) []  - 0 Routine Hospital Admission (non-emergent condition) []  - 0 New Admissions / Manufacturing engineer / Ordering NPWT Apligraf, etc. , []  - 0 Emergency Hospital Admission (emergent condition) X- 1 10 Simple Discharge Coordination []  - 0 Complex (extensive) Discharge Coordination PROCESS - Special Needs []  - 0 Pediatric / Minor Patient Management []  - 0 Isolation Patient Management []  - 0 Hearing / Language / Visual  special needs []  - 0 Assessment of Community assistance (transportation, D/C planning, etc.) []  - 0 Additional assistance / Altered mentation []  - 0 Support Surface(s) Assessment (bed,  cushion, seat, etc.) INTERVENTIONS - Wound Cleansing / Measurement X - Simple Wound Cleansing - one wound 1 5 []  - 0 Complex Wound Cleansing - multiple wounds X- 1 5 Wound Imaging (photographs - any number of wounds) []  - 0 Wound Tracing (instead of photographs) X- 1 5 Simple Wound Measurement - one wound []  - 0 Complex Wound Measurement - multiple wounds INTERVENTIONS - Wound Dressings X - Small Wound Dressing one or multiple wounds 1 10 []  - 0 Medium Wound Dressing one or multiple wounds []  - 0 Large Wound Dressing one or multiple wounds []  - 0 Application of Medications - topical []  - 0 Application of Medications - injection INTERVENTIONS - Miscellaneous []  - 0 External ear exam SHRESHTA, VISAYA ([id number]) [id number] Page 3 of 10 []  - 0 Specimen Collection (cultures, biopsies, blood, body fluids, etc.) []  - 0 Specimen(s) / Culture(s) sent or taken to Lab for analysis []  - 0 Patient Transfer (multiple staff / Michiel Sites Lift / Similar devices) []  - 0 Simple Staple / Suture removal (25 or less) []  - 0 Complex Staple / Suture removal (26 or more) []  - 0 Hypo / Hyperglycemic Management (close monitor of Blood Glucose) []  - 0 Ankle / Brachial Index (ABI) - do not check if billed separately X- 1 5 Vital Signs Has the patient been seen at the hospital within the last three years: Yes Total Score: 105 Level Of Care: New/Established - Level 3 Electronic Signature(s) Signed: 07/14/2023 11:41:18 AM By: Yevonne Pax RN Entered By: Yevonne Pax on 07/10/2023 13:15:42 -------------------------------------------------------------------------------- Complex / Palliative Patient Assessment Details Patient Name: Date of Service: [name], [name] 07/10/2023 12:30  PM Medical Record Number: [id number] Patient Account Number: [id number] Date of Birth/Sex: Treating RN: 1971/08/12 (52 y.o. [name] Primary Care Guilianna Mckoy: [name], [name] Other Clinician: Referring Kasandra Fehr: Treating Bethsaida Siegenthaler/Extender: [name] [place] [name] in Treatment: 15 Complex Wound Management Criteria Patient has remarkable or complex co-morbidities requiring medications or treatments that extend wound healing times. Examples: Diabetes mellitus with chronic renal failure or end stage renal disease requiring dialysis Advanced or poorly controlled rheumatoid arthritis Diabetes mellitus and end stage chronic obstructive pulmonary disease Active cancer with current chemo- or radiation therapy MS, CHF, paraplegia, Palliative Wound Management Criteria Care Approach Wound Care Plan: Complex Wound Management Electronic Signature(s) Signed: 08/02/2023 9:57:59 AM By: Yevonne Pax RN Signed: 08/10/2023 7:58:12 AM By: [name] PA-C Entered By: Yevonne Pax on 08/02/2023 09:57:58 Dubie, Drucie Opitz ([id number]) [id number] Page 4 of 10 -------------------------------------------------------------------------------- Encounter Discharge Information Details Patient Name: Date of Service: [name], [name] 07/10/2023 12:30 PM Medical Record Number: [id number] Patient Account Number: [id number] Date of Birth/Sex: Treating RN: 16-Sep-1971 (52 y.o. [name] Primary Care Cathrine Krizan: [name], [name] Other Clinician: Referring Keawe Marcello: Treating Cadan Maggart/Extender: [name] [place] [name] in Treatment: 15 Encounter Discharge Information Items Discharge Condition: Stable Ambulatory Status: Wheelchair Discharge Destination: Home Transportation: Private Auto Accompanied By: self Schedule Follow-up Appointment: Yes Clinical Summary of Care: Electronic Signature(s) Signed: 07/10/2023 1:16:52 PM By: Yevonne Pax RN Entered By:  Yevonne Pax on 07/10/2023 13:16:52 -------------------------------------------------------------------------------- Lower Extremity Assessment Details Patient Name: Date of Service: [name], [name] 07/10/2023 12:30 PM Medical Record Number: [id number] Patient Account Number: [id number] Date of Birth/Sex: Treating RN: 12/24/1970 (52 y.o. [name] Primary Care Berline Semrad: [name], [name] Other Clinician: Referring Tanairi Cypert: Treating Arael Piccione/Extender: [name] [place] [name] in Treatment: 15 Electronic  Signature(s) Signed: 07/10/2023 12:42:01 PM By: Yevonne Pax RN Entered By: Yevonne Pax on 07/10/2023 12:42:01 [name] ([id number]) [id number] Page 5 of 10 -------------------------------------------------------------------------------- Multi Wound Chart Details Patient Name: Date of Service: [name], [name] 07/10/2023 12:30 PM Medical Record Number: [id number] Patient Account Number: [id number] Date of Birth/Sex: Treating RN: 08-11-1971 (52 y.o. [name] Primary Care Camillia Marcy: [name], [name] Other Clinician: Referring Shellee Streng: Treating Shafter Jupin/Extender: [name] [place] [name] in Treatment: 15 Vital Signs Height(in): 64 Pulse(bpm): 66 Weight(lbs): 166 Blood Pressure(mmHg): 122/80 Body Mass Index(BMI): 28.5 Temperature(F): 98 Respiratory Rate(breaths/min): 16 [1:Photos:] [N/A:N/A] Left Gluteus N/A N/A Wound Location: Blister N/A N/A Wounding Event: Pressure Ulcer N/A N/A Primary Etiology: Neuropathy N/A N/A Comorbid History: 03/07/2023 N/A N/A Date Acquired: 15 N/A N/A Weeks of Treatment: Open N/A N/A Wound Status: No N/A N/A Wound Recurrence: 0.2x0.6x2.4 N/A N/A Measurements L x W x D (cm) 0.094 N/A N/A A (cm) : rea 0.226 N/A N/A Volume (cm) : 93.40% N/A N/A % Reduction in A rea: 92.40% N/A N/A % Reduction in Volume: 1 Starting Position 1 (o'clock): 3 Ending  Position 1 (o'clock): 3.7 Maximum Distance 1 (cm): Yes N/A N/A Undermining: Category/Stage IV N/A N/A Classification: Medium N/A N/A Exudate A mount: Serosanguineous N/A N/A Exudate Type: red, brown N/A N/A Exudate Color: Large (67-100%) N/A N/A Granulation A mount: Red N/A N/A Granulation Quality: None Present (0%) N/A N/A Necrotic A mount: Fat Layer (Subcutaneous Tissue): Yes N/A N/A Exposed Structures: Fascia: No Tendon: No Muscle: No Joint: No Bone: No None N/A N/A Epithelialization: Treatment Notes Electronic Signature(s) Signed: 07/10/2023 12:42:05 PM By: Yevonne Pax RN Entered By: Yevonne Pax on 07/10/2023 12:42:05 [name] ([id number]) [id number] Page 6 of 10 -------------------------------------------------------------------------------- Multi-Disciplinary Care Plan Details Patient Name: Date of Service: [name], [name] 07/10/2023 12:30 PM Medical Record Number: [id number] Patient Account Number: [id number] Date of Birth/Sex: Treating RN: 1971/06/10 (52 y.o. [name] Primary Care Glenyce Randle: [name], [name] Other Clinician: Referring Tan Clopper: Treating Angellee Cohill/Extender: [name] [place] [name] in Treatment: 15 Active Inactive Wound/Skin Impairment Nursing Diagnoses: Impaired tissue integrity Knowledge deficit related to smoking impact on wound healing Knowledge deficit related to ulceration/compromised skin integrity Goals: Patient/caregiver will verbalize understanding of skin care regimen Date Initiated: 03/21/2023 Target Resolution Date: 07/21/2023 Goal Status: Active Ulcer/skin breakdown will have a volume reduction of 30% by week 4 Date Initiated: 03/21/2023 Date Inactivated: 04/18/2023 Target Resolution Date: 04/18/2023 Goal Status: Unmet Unmet Reason: comorbidities Ulcer/skin breakdown will have a volume reduction of 50% by week 8 Date Initiated: 03/21/2023 Date Inactivated:  05/16/2023 Target Resolution Date: 05/16/2023 Goal Status: Unmet Unmet Reason: comorbidities Ulcer/skin breakdown will have a volume reduction of 80% by week 12 Date Initiated: 03/21/2023 Date Inactivated: 06/27/2023 Target Resolution Date: 06/13/2023 Goal Status: Unmet Unmet Reason: comorbidities Ulcer/skin breakdown will heal within 14 weeks Date Initiated: 03/21/2023 Date Inactivated: 06/27/2023 Target Resolution Date: 06/27/2023 Goal Status: Unmet Unmet Reason: comorbidities Interventions: Assess patient/caregiver ability to obtain necessary supplies Assess patient/caregiver ability to perform ulcer/skin care regimen upon admission and as needed Assess ulceration(s) every visit Treatment Activities: Patient referred to home care : 03/21/2023 Skin care regimen initiated : 03/21/2023 Notes: Electronic Signature(s) Signed: 07/10/2023 12:42:12 PM By: Yevonne Pax RN Entered By: Yevonne Pax on 07/10/2023 12:42:12 [name] ([id number]) [id number] Page 7 of 10 -------------------------------------------------------------------------------- Pain Assessment Details Patient Name: Date of Service: [name], [name] 07/10/2023 12:30 PM Medical Record Number:  [id number] Patient Account Number: [id number] Date of Birth/Sex: Treating RN: 05-27-1971 (52 y.o. [name] Primary Care Velmer Woelfel: [name], [name] Other Clinician: Referring Elizjah Noblet: Treating Nilani Hugill/Extender: [name] [place] [name] in Treatment: 15 Active Problems Location of Pain Severity and Description of Pain Patient Has Paino No Site Locations Pain Management and Medication Current Pain Management: Electronic Signature(s) Signed: 07/10/2023 12:39:45 PM By: Yevonne Pax RN Entered By: Yevonne Pax on 07/10/2023 12:39:45 -------------------------------------------------------------------------------- Patient/Caregiver Education Details Patient Name: Date of  Service: [name] 10/14/2024andnbsp12:30 PM Medical Record Number: [id number] Patient Account Number: [id number] Date of Birth/Gender: Treating RN: [birth year] (52 y.o. [name] Primary Care Physician: Bertram Denver Other Clinician: Referring Physician: Treating Physician/Extender: [name] [place] [name] in Treatment: [address] [place] L ([id number]) [id number] Page 8 of 10 Education Assessment Education Provided To: Patient Education Topics Provided Wound/Skin Impairment: Handouts: Caring for Your Ulcer Methods: Explain/Verbal Responses: State content correctly Electronic Signature(s) Signed: 07/14/2023 11:41:18 AM By: Yevonne Pax RN Entered By: Yevonne Pax on 07/10/2023 12:42:23 -------------------------------------------------------------------------------- Wound Assessment Details Patient Name: Date of Service: [name], [name] 07/10/2023 12:30 PM Medical Record Number: [id number] Patient Account Number: [id number] Date of Birth/Sex: Treating RN: 01/10/1971 (52 y.o. [name] Primary Care Jordyan Hardiman: [name], [name] Other Clinician: Referring Dellamae Rosamilia: Treating Haley Fuerstenberg/Extender: [name] [place] [name] in Treatment: 15 Wound Status Wound Number: 1 Primary Etiology: Pressure Ulcer Wound Location: Left Gluteus Wound Status: Open Wounding Event: Blister Comorbid History: Neuropathy Date Acquired: 03/07/2023 Weeks Of Treatment: 15 Clustered Wound: No Photos Wound Measurements Length: (cm) 0.2 Width: (cm) 0.6 Depth: (cm) 2.4 Area: (cm) 0.094 Volume: (cm) 0.226 % Reduction in Area: 93.4% % Reduction in Volume: 92.4% Epithelialization: None Tunneling: Yes Position (o'clock): 1 Maximum Distance: (cm) 3.7 Undermining: Yes TECIA, REITNAUER L ([id number]) [id number] Page 9 of 10 Wound Description Classification: Category/Stage IV Exudate Amount:  Medium Exudate Type: Serosanguineous Exudate Color: red, brown Foul Odor After Cleansing: No Slough/Fibrino No Wound Bed Granulation Amount: Large (67-100%) Exposed Structure Granulation Quality: Red Fascia Exposed: No Necrotic Amount: None Present (0%) Fat Layer (Subcutaneous Tissue) Exposed: Yes Tendon Exposed: No Muscle Exposed: No Joint Exposed: No Bone Exposed: No Treatment Notes Wound #1 (Gluteus) Wound Laterality: Left Cleanser Soap and Water Discharge Instruction: Gently cleanse wound with antibacterial soap, rinse and pat dry prior to dressing wounds Peri-Wound Care Topical Primary Dressing Prisma 4.34 (in) Discharge Instruction: tuck into undermined area dry Secondary Dressing ABD Pad 5x9 (in/in) Discharge Instruction: Cover with ABD pad hydrofera blue rope Discharge Instruction: 1/2 stick place into wound dry and up and into tunnel Secured With Medipore T - 77M Medipore H Soft Cloth Surgical T ape ape, 2x2 (in/yd) Compression Wrap Compression Stockings Add-Ons Electronic Signature(s) Signed: 07/10/2023 1:23:44 PM By: Yevonne Pax RN Previous Signature: 07/10/2023 12:41:49 PM Version By: Yevonne Pax RN Entered By: Yevonne Pax on 07/10/2023 13:23:44 -------------------------------------------------------------------------------- Vitals Details Patient Name: Date of Service: [name], [name] 07/10/2023 12:30 PM Medical Record Number: [id number] Patient Account Number: [id number] Date of Birth/Sex: Treating RN: March 26, 1971 (52 y.o. [name] Primary Care Adair Lauderback: Bertram Denver Other Clinician: Referring Salaya Holtrop: Treating Saphronia Ozdemir/Extender: [name] [place] [name] in Treatment: 15 Vital Signs [name], [place] L ([id number]) [id number] Page 10 of 10 Time Taken: 12:39 Temperature (F): 98 Height (in): 64 Pulse (bpm): 66 Weight (lbs): 166 Respiratory Rate (breaths/min): 16 Body Mass Index (BMI):  28.5  Blood Pressure (mmHg): 122/80 Reference Range: 80 - 120 mg / dl Electronic Signature(s) Signed: 07/10/2023 12:39:36 PM By: Yevonne Pax RN Entered By: Yevonne Pax on 07/10/2023 12:39:36

## 2023-07-10 NOTE — Progress Notes (Signed)
[name], [name] ([id number]) [id number] Page 1 of 3 Visit Report for 07/10/2023 HBO Details Patient Name: Date of Service: [name], [name] 07/10/2023 10:00 A M Medical Record Number: [id number] Patient Account Number: [id number] Date of Birth/Sex: Treating RN: 10-03-1970 (52 y.o. 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, HBO Treatment Details Treatment Number: 1 Patient Type: Outpatient Chamber Type: Monoplace Chamber Serial #: [id number] Treatment Protocol: 2.0 ATA with 90 minutes oxygen, and no air breaks Treatment Details Compression Rate Down: 2.0 psi / minute De-Compression Rate Up: 1.5 psi / minute Air breaks and breathing Decompress Decompress Compress Tx Pressure Begins Reached periods Begins Ends (leave unused spaces blank) Chamber Pressure (ATA 1 2 ------2 1 ) Clock Time (24 hr) 10:22 10:33 - - - - - - 12:06 12:16 Treatment Length: 114 (minutes) Treatment Segments: 4 Vital Signs Capillary Blood Glucose Reference Range: 80 - 120 mg / dl HBO Diabetic Blood Glucose Intervention Range: <131 mg/dl or >528 mg/dl Time Vitals Blood Respiratory Capillary Blood Glucose Pulse Action Type: Pulse: Temperature: Taken: Pressure: Rate: Glucose (mg/dl): Meter #: Oximetry (%) Taken: Pre 10:00 128/80 82 16 98 98 Treatment Response Treatment Toleration: Well Treatment Completion Status: Treatment Completed without Adverse Event Electronic Signature(s) Signed: 07/10/2023 3:53:32 PM By: Demetria Pore Signed: 07/10/2023 4:48:00 PM By: [name] PA-C Entered By: Demetria Pore on  07/10/2023 09:26:31 [name] ([id number]) [id number] Page 2 of 3 -------------------------------------------------------------------------------- HBO Patient Questionnaire Details Patient Name: Date of Service: [name], [name] 07/10/2023 10:00 A M Medical Record Number: [id number] Patient Account Number: [id number] Date of Birth/Sex: Treating RN: [birth year] (52 y.o. F) Primary Care Ariz Terrones: Bertram Denver Other Clinician: Referring Nary Sneed: Treating Sybil Shrader/Extender: [name] [place] [name] in Treatment: 15 HBO Patient Questionnaire Items Answer A "Yes" answers must be brought to the hyperbaric physician's attention. ny Breathing or Lung problemso No Currently use tobacco productso No Used tobacco products in the pasto No Heart problemso No Do you take water pills (diuretic)o No Diabeteso No Dialysiso No Eye problems like glaucomao No Ear problems or surgeryo No Sinus Problemso No Cancero No Confinement Anxiety (Claustrophobia- fear of confined places)o No Any medical implants/devices that are fully or partially implanted or attached to your bodyo No Pregnanto No Seizureso No Date of last: Chest x-ray: 05/03/23 EKG: 04/24/2023 CBC: 06/29/2023 Electronic Signature(s) Signed: 07/10/2023 3:53:32 PM By: Demetria Pore Entered By: Demetria Pore on 07/10/2023 08:07:21 -------------------------------------------------------------------------------- HBO Safety Checklist Details Patient Name: Date of Service: [name], [name] 07/10/2023 10:00 A M Medical Record Number: [id number] Patient Account Number: [id number] Date of Birth/Sex: Treating RN: 22-Jan-1971 (52 y.o. F) Primary Care Zaylynn Rickett: Bertram Denver Other Clinician: Referring Nada Godley: Treating Virgie Chery/Extender: [name] [place] [name] in Treatment: [address], [place] L ([id number]) [id number] Page 3 of 3 HBO Safety Checklist  Items Safety Checklist Consent Form Signed Patient voided / foley secured and emptied When did you last eato 07/10/2023 Last dose of injectable or oral agent Ostomy pouch emptied and vented if applicable NA All implantable devices assessed, documented and approved NA Intravenous access site secured and place NA Valuables secured Linens and cotton and cotton/polyester blend (less than 51%  polyester) Personal oil-based products / skin lotions / body lotions removed Wigs or hairpieces removed Smoking or tobacco materials removed NA Books / newspapers / magazines / loose paper removed Cologne, aftershave, perfume and deodorant removed Jewelry removed (may wrap wedding band) Make-up removed Hair care products removed Battery operated devices (external) removed NA Heating patches and chemical warmers removed NA Titanium eyewear removed NA Nail polish cured greater than 10 hours NA Casting material cured greater than 10 hours NA Hearing aids removed NA Loose dentures or partials removed NA Prosthetics have been removed NA Patient demonstrates correct use of air break device (if applicable) Patient concerns have been addressed Patient grounding bracelet on and cord attached to chamber Specifics for Inpatients (complete in addition to above) Medication sheet sent with patient NA Intravenous medications needed or due during therapy sent with patient NA Drainage tubes (e.g. nasogastric tube or chest tube secured and vented) NA Endotracheal or Tracheotomy tube secured NA Cuff deflated of air and inflated with saline NA Airway suctioned NA Electronic Signature(s) Signed: 07/10/2023 3:53:32 PM By: Demetria Pore Entered By: Demetria Pore on 07/10/2023 09:26:15

## 2023-07-11 ENCOUNTER — Encounter: Payer: 59 | Admitting: Physician Assistant

## 2023-07-11 DIAGNOSIS — E66811 Obesity (BMI 30.0-34.9): Principal | ICD-10-CM

## 2023-07-11 DIAGNOSIS — Z789 Other specified health status: Principal | ICD-10-CM

## 2023-07-11 DIAGNOSIS — G35 Multiple sclerosis: Principal | ICD-10-CM

## 2023-07-11 DIAGNOSIS — M8668 Other chronic osteomyelitis, other site: Secondary | ICD-10-CM | POA: Diagnosis not present

## 2023-07-11 NOTE — Progress Notes (Signed)
GERTRUDE, BUCKS ([id number]) [phone].pdf Page 1 of 2 Visit Report for 07/11/2023 Arrival Information Details Patient Name: Date of Service: [name], [name] 07/11/2023 10:00 A M Medical Record Number: [id number] Patient Account Number: [id number] Date of Birth/Sex: Treating RN: 1971/05/21 (52 y.o. F) Primary Care Leoda Smithhart: Bertram Denver Other Clinician: Referring Morris Longenecker: Treating Hanz Winterhalter/Extender: [name] [place] [name] in Treatment: 16 Visit Information History Since Last Visit Added or deleted any medications: No Patient Arrived: Wheel Chair Any new allergies or adverse reactions: No Arrival Time: 10:00 Had a fall or experienced change in No Accompanied By: neice activities of daily living that may affect Transfer Assistance: Michiel Sites Lift risk of falls: Patient Identification Verified: Yes Signs or symptoms of abuse/neglect since last visito No Secondary Verification Process Completed: Yes Hospitalized since last visit: No Patient Requires Transmission-Based Precautions: No Implantable device outside of the clinic excluding No Patient Has Alerts: Yes cellular tissue based products placed in the center Patient Alerts: NOT DIABETIC since last visit: Pain Present Now: No Electronic Signature(s) Signed: 07/11/2023 3:37:04 PM By: Demetria Pore Entered By: Demetria Pore on 07/11/2023 15:21:10 -------------------------------------------------------------------------------- Encounter Discharge Information Details Patient Name: Date of Service: [name], [name] 07/11/2023 10:00 A M Medical Record Number: [id number] Patient Account Number: [id number] Date of Birth/Sex: Treating RN: 10/29/1970 (52 y.o. F) Primary Care Aryahna Spagna: Bertram Denver Other Clinician: Referring Tyshaun Vinzant: Treating Geordie Nooney/Extender: [name] [place] [name] in Treatment: 95 Encounter Discharge Information Items Discharge Condition:  Stable Ambulatory Status: Wheelchair Discharge Destination: Home Transportation: Private Auto Accompanied By: self Schedule Follow-up Appointment: Yes Clinical Summary of Care: [name], [name] ([id number]) [id number] Page 2 of 2 Electronic Signature(s) Signed: 07/11/2023 3:22:20 PM By: Demetria Pore Entered By: Demetria Pore on 07/11/2023 15:22:20 -------------------------------------------------------------------------------- Vitals Details Patient Name: Date of Service: [name], [name] 07/11/2023 10:00 A M Medical Record Number: [id number] Patient Account Number: [id number] Date of Birth/Sex: Treating RN: 10/16/1970 (52 y.o. F) Primary Care Emoni Yang: Bertram Denver Other Clinician: Referring Marla Pouliot: Treating Devery Murgia/Extender: [name] [place] [name] in Treatment: 16 Vital Signs Time Taken: 10:00 Temperature (F): 98.0 Height (in): 64 Pulse (bpm): 68 Weight (lbs): 166 Respiratory Rate (breaths/min): 16 Body Mass Index (BMI): 28.5 Blood Pressure (mmHg): 120/78 Reference Range: 80 - 120 mg / dl Airway Pulse Oximetry (%): 98 Electronic Signature(s) Signed: 07/11/2023 3:37:04 PM By: Demetria Pore Entered By: Demetria Pore on 07/11/2023 15:21:13

## 2023-07-12 ENCOUNTER — Encounter: Payer: 59 | Admitting: Internal Medicine

## 2023-07-12 ENCOUNTER — Encounter: Payer: 59 | Admitting: Physician Assistant

## 2023-07-12 DIAGNOSIS — M8668 Other chronic osteomyelitis, other site: Secondary | ICD-10-CM | POA: Diagnosis not present

## 2023-07-12 NOTE — Progress Notes (Signed)
MILAYA, NOVOSEL ([id number]) [id number] Page 1 of 2 Visit Report for 07/11/2023 HBO Details Patient Name: Date of Service: [name], [name] 07/11/2023 10:00 A M Medical Record Number: [id number] Patient Account Number: [id number] Date of Birth/Sex: Treating RN: 08/18/1971 (53 y.o. 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, HBO Treatment Details Treatment Number: 2 Patient Type: Outpatient Chamber Type: Monoplace Chamber Serial #: [id number] Treatment Protocol: 2.0 ATA with 90 minutes oxygen, and no air breaks Treatment Details Compression Rate Down: 2.0 psi / minute De-Compression Rate Up: 1.5 psi / minute Air breaks and breathing Decompress Decompress Compress Tx Pressure Begins Reached periods Begins Ends (leave unused spaces blank) Chamber Pressure (ATA 1 2 ------2 1 ) Clock Time (24 hr) 10:11 10:22 - - - - - - 11:52 12:02 Treatment Length: 111 (minutes) Treatment Segments: 4 Vital Signs Capillary Blood Glucose Reference Range: 80 - 120 mg / dl HBO Diabetic Blood Glucose Intervention Range: <131 mg/dl or >272 mg/dl Time Vitals Blood Respiratory Capillary Blood Glucose Pulse Action Type: Pulse: Temperature: Taken: Pressure: Rate: Glucose (mg/dl): Meter #: Oximetry (%) Taken: Pre 10:00 120/78 68 16 98 98 Post 12:02 118/80 68 16 97.8 Treatment Response Treatment Toleration: Well Treatment Completion Status: Treatment Completed without Adverse Event Electronic Signature(s) Signed: 07/11/2023 3:37:04 PM By: Demetria Pore Signed: 07/11/2023 6:10:25 PM By: [name]  PA-C Entered By: Demetria Pore on 07/11/2023 15:21:45 [name] ([id number]) [id number] Page 2 of 2 -------------------------------------------------------------------------------- HBO Safety Checklist Details Patient Name: Date of Service: [name], [name] 07/11/2023 10:00 A M Medical Record Number: [id number] Patient Account Number: [id number] Date of Birth/Sex: Treating RN: 1971/07/24 (52 y.o. F) Primary Care Peggie Hornak: Bertram Denver Other Clinician: Referring Holleigh Crihfield: Treating Aijah Lattner/Extender: [name] [place] [name] in Treatment: 16 HBO Safety Checklist Items Safety Checklist Consent Form Signed Patient voided / foley secured and emptied When did you last eato 07/11/23 Last dose of injectable or oral agent Ostomy pouch emptied and vented if applicable NA All implantable devices assessed, documented and approved NA Intravenous access site secured and place NA Valuables secured Linens and cotton and cotton/polyester blend (less than 51% polyester) Personal oil-based products / skin lotions / body lotions removed Wigs or hairpieces removed NA Smoking or tobacco materials removed NA Books / newspapers / magazines / loose paper removed Cologne, aftershave, perfume and deodorant removed Jewelry removed (may wrap wedding band) Make-up removed Hair care products removed Battery operated devices (external) removed NA Heating patches and chemical warmers removed NA Titanium eyewear removed NA Nail polish cured greater than 10 hours NA Casting material cured greater than 10 hours NA Hearing aids removed NA Loose dentures or partials removed NA Prosthetics have been removed NA Patient demonstrates correct use of air break device (if applicable) Patient concerns have been addressed Patient grounding bracelet on and cord attached to chamber Specifics for Inpatients (complete in addition to above) Medication sheet sent  with patient NA Intravenous medications needed or due during therapy sent with patient NA Drainage tubes (e.g. nasogastric tube or chest tube secured and vented) NA Endotracheal or Tracheotomy tube secured NA Cuff deflated of air and inflated with saline NA Airway suctioned NA Electronic Signature(s)  Signed: 07/11/2023 3:37:04 PM By: Demetria Pore Entered By: Demetria Pore on 07/11/2023 15:21:16

## 2023-07-13 ENCOUNTER — Encounter: Payer: 59 | Admitting: Physician Assistant

## 2023-07-13 DIAGNOSIS — M8668 Other chronic osteomyelitis, other site: Secondary | ICD-10-CM | POA: Diagnosis not present

## 2023-07-13 NOTE — Progress Notes (Signed)
Samantha, Pratt ([id number]) [id number] Page 1 of 2 Visit Report for 07/12/2023 HBO Details Patient Name: Date of Service: [name], [name] 07/12/2023 10:00 A M Medical Record Number: [id number] Patient Account Number: [id number] Date of Birth/Sex: Treating RN: 1971/08/17 (52 y.o. 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, HBO Treatment Details Treatment Number: 3 Patient Type: Outpatient Chamber Type: Monoplace Chamber Serial #: [id number] Treatment Protocol: 2.0 ATA with 90 minutes oxygen, and no air breaks Treatment Details Compression Rate Down: 2.0 psi / minute De-Compression Rate Up: 1.5 psi / minute Air breaks and breathing Decompress Decompress Compress Tx Pressure Begins Reached periods Begins Ends (leave unused spaces blank) Chamber Pressure (ATA 1 2 ------2 1 ) Clock Time (24 hr) 10:11 10:23 - - - - - - 11:54 12:04 Treatment Length: 113 (minutes) Treatment Segments: 4 Vital Signs Capillary Blood Glucose Reference Range: 80 - 120 mg / dl HBO Diabetic Blood Glucose Intervention Range: <131 mg/dl or >938 mg/dl Time Vitals Blood Respiratory Capillary Blood Glucose Pulse Action Type: Pulse: Temperature: Taken: Pressure: Rate: Glucose (mg/dl): Meter #: Oximetry (%) Taken: Pre 10:00 124/68 74 16 98 98 Post 12:04 122/68 75 16 98 Treatment Response Treatment Toleration: Well Treatment Completion Status: Treatment Completed without Adverse Event Shyan Scalisi Notes No concerns with treatment given HBO Attestation I certify that I supervised this HBO treatment in  accordance with Medicare guidelines. A trained emergency response team is readily available per Yes hospital policies and procedures. Continue HBOT as ordered. Yes Electronic Signature(s) JASHANTI, BURLEIGH ([id number]) [id number] Page 2 of 2 Signed: 07/12/2023 4:42:07 PM By: Baltazar Najjar MD Entered By: Baltazar Najjar on 07/12/2023 16:40:42 -------------------------------------------------------------------------------- HBO Safety Checklist Details Patient Name: Date of Service: [name], Shemeka L. 07/12/2023 10:00 A M Medical Record Number: [id number] Patient Account Number: [id number] Date of Birth/Sex: Treating RN: 06/21/1971 (52 y.o. F) Primary Care Lorry Anastasi: Bertram Denver Other Clinician: Referring Aztlan Coll: Treating Florentina Marquart/Extender: RO BSO N, MICHA EL G [place], [place] UREN Weeks in Treatment: 16 HBO Safety Checklist Items Safety Checklist Consent Form Signed Patient voided / foley secured and emptied When did you last eato 07/12/2023 Last dose of injectable or oral agent Ostomy pouch emptied and vented if applicable All implantable devices assessed, documented and approved NA Intravenous access site secured and place NA Valuables secured Linens and cotton and cotton/polyester blend (less than 51% polyester) Personal oil-based products / skin lotions / body lotions removed Wigs or hairpieces removed NA Smoking or tobacco materials removed NA Books / newspapers / magazines / loose paper removed Cologne, aftershave, perfume and deodorant removed Jewelry removed (may wrap wedding band) Make-up removed Hair care products removed Battery operated devices (external) removed NA Heating patches and chemical warmers removed NA Titanium eyewear removed NA Nail polish cured greater than 10 hours NA Casting material cured greater than 10 hours NA Hearing aids removed NA Loose dentures or partials removed NA Prosthetics have been  removed NA Patient demonstrates correct use of air break device (if applicable) Patient concerns have been addressed Patient grounding bracelet on and cord attached to chamber Specifics for Inpatients (complete in addition to above) Medication sheet sent with patient NA Intravenous medications  needed or due during therapy sent with patient NA Drainage tubes (e.g. nasogastric tube or chest tube secured and vented) NA Endotracheal or Tracheotomy tube secured NA Cuff deflated of air and inflated with saline NA Airway suctioned NA Electronic Signature(s) Signed: 07/13/2023 3:58:20 PM By: Demetria Pore Entered By: Demetria Pore on 07/12/2023 15:39:32

## 2023-07-13 NOTE — Progress Notes (Signed)
HILARY, MILKS ([id number]) [phone].pdf Page 1 of 2 Visit Report for 07/13/2023 Arrival Information Details Patient Name: Date of Service: [name], [name] 07/13/2023 10:00 A M Medical Record Number: [id number] Patient Account Number: [id number] Date of Birth/Sex: Treating RN: 1971/03/28 (52 y.o. F) Primary Care Tkeyah Burkman: Bertram Denver Other Clinician: Referring Vance Belcourt: Treating Jaquane Boughner/Extender: [name] [place] [name] in Treatment: 16 Visit Information History Since Last Visit Added or deleted any medications: No Patient Arrived: Wheel Chair Any new allergies or adverse reactions: No Arrival Time: 10:12 Had a fall or experienced change in No Accompanied By: neice activities of daily living that may affect Transfer Assistance: Michiel Sites Lift risk of falls: Patient Identification Verified: Yes Signs or symptoms of abuse/neglect since last visito No Secondary Verification Process Completed: Yes Hospitalized since last visit: No Patient Requires Transmission-Based Precautions: No Implantable device outside of the clinic excluding No Patient Has Alerts: Yes cellular tissue based products placed in the center Patient Alerts: NOT DIABETIC since last visit: Pain Present Now: No Electronic Signature(s) Signed: 07/13/2023 3:58:20 PM By: Demetria Pore Entered By: Demetria Pore on 07/13/2023 14:50:51 -------------------------------------------------------------------------------- Encounter Discharge Information Details Patient Name: Date of Service: [name], [name] 07/13/2023 10:00 A M Medical Record Number: [id number] Patient Account Number: [id number] Date of Birth/Sex: Treating RN: 1971/03/05 (52 y.o. F) Primary Care Corena Tilson: Bertram Denver Other Clinician: Referring Joniel Graumann: Treating Kolston Lacount/Extender: [name] [place] [name] in Treatment: 97 Encounter Discharge Information Items Discharge Condition:  Stable Ambulatory Status: Ambulatory Discharge Destination: Home Transportation: Private Auto Accompanied By: neice Schedule Follow-up Appointment: Yes Clinical Summary of Care: [name], [name] ([id number]) [phone].pdf Page 2 of 2 Electronic Signature(s) Signed: 07/13/2023 3:58:20 PM By: Demetria Pore Entered By: Demetria Pore on 07/13/2023 14:52:54 -------------------------------------------------------------------------------- Vitals Details Patient Name: Date of Service: [name], [name] 07/13/2023 10:00 A M Medical Record Number: [id number] Patient Account Number: [id number] Date of Birth/Sex: Treating RN: 1971/03/25 (52 y.o. F) Primary Care Shanequa Whitenight: Bertram Denver Other Clinician: Referring Lasharon Dunivan: Treating Valma Rotenberg/Extender: [name] [place] [name] in Treatment: 16 Vital Signs Time Taken: 09:54 Temperature (F): 98.0 Height (in): 64 Pulse (bpm): 68 Weight (lbs): 166 Respiratory Rate (breaths/min): 16 Body Mass Index (BMI): 28.5 Blood Pressure (mmHg): 120/68 Reference Range: 80 - 120 mg / dl Airway Pulse Oximetry (%): 99 Electronic Signature(s) Signed: 07/13/2023 3:58:20 PM By: Demetria Pore Entered By: Demetria Pore on 07/13/2023 14:50:59

## 2023-07-13 NOTE — Progress Notes (Signed)
ALEISA, HOWK ([id number]) [phone].pdf Page 1 of 2 Visit Report for 07/12/2023 Arrival Information Details Patient Name: Date of Service: STAR, RESLER 07/12/2023 10:00 A M Medical Record Number: [id number] Patient Account Number: [id number] Date of Birth/Sex: Treating RN: [birth year] (52 y.o. F) Primary Care Chavonne Sforza: Bertram Denver Other Clinician: Referring Karyssa Amaral: Treating Soua Lenk/Extender: RO BSO N, MICHA EL G [place], [place] UREN Weeks in Treatment: 16 Visit Information History Since Last Visit Added or deleted any medications: No Patient Arrived: Wheel Chair Any new allergies or adverse reactions: No Arrival Time: 10:00 Had a fall or experienced change in No Accompanied By: self activities of daily living that may affect Transfer Assistance: None risk of falls: Patient Identification Verified: Yes Signs or symptoms of abuse/neglect since last visito No Secondary Verification Process Completed: Yes Hospitalized since last visit: No Patient Requires Transmission-Based Precautions: No Implantable device outside of the clinic excluding No Patient Has Alerts: Yes cellular tissue based products placed in the center Patient Alerts: NOT DIABETIC since last visit: Pain Present Now: No Electronic Signature(s) Signed: 07/13/2023 3:58:20 PM By: Demetria Pore Entered By: Demetria Pore on 07/12/2023 15:39:26 -------------------------------------------------------------------------------- Encounter Discharge Information Details Patient Name: Date of Service: [name], Fannye L. 07/12/2023 10:00 A M Medical Record Number: [id number] Patient Account Number: [id number] Date of Birth/Sex: Treating RN: 09-09-1971 (52 y.o. F) Primary Care Rishav Rockefeller: Bertram Denver Other Clinician: Referring Kihanna Kamiya: Treating Chade Pitner/Extender: RO BSO N, MICHA EL G [place], [place] UREN Weeks in Treatment: 16 Encounter Discharge Information Items Discharge Condition:  Stable Ambulatory Status: Wheelchair Discharge Destination: Home Transportation: Private Auto Accompanied By: neice Schedule Follow-up Appointment: No Clinical Summary of Care: MEENAKSHI, SAZAMA ([id number]) [phone].pdf Page 2 of 2 Electronic Signature(s) Signed: 07/13/2023 3:58:20 PM By: Demetria Pore Entered By: Demetria Pore on 07/12/2023 15:40:17 -------------------------------------------------------------------------------- Vitals Details Patient Name: Date of Service: [name], Docia L. 07/12/2023 10:00 A M Medical Record Number: [id number] Patient Account Number: [id number] Date of Birth/Sex: Treating RN: 04/19/1971 (52 y.o. F) Primary Care Juno Bozard: GO Quintella Reichert, [name] Other Clinician: Referring Shavy Beachem: Treating Kaleth Koy/Extender: RO BSO N, MICHA EL G [place], [place] UREN Weeks in Treatment: 16 Vital Signs Time Taken: 10:00 Temperature (F): 98.0 Height (in): 64 Pulse (bpm): 74 Weight (lbs): 166 Respiratory Rate (breaths/min): 16 Body Mass Index (BMI): 28.5 Blood Pressure (mmHg): 124/68 Reference Range: 80 - 120 mg / dl Airway Pulse Oximetry (%): 98 Electronic Signature(s) Signed: 07/13/2023 3:58:20 PM By: Demetria Pore Entered By: Demetria Pore on 07/12/2023 15:39:28

## 2023-07-14 ENCOUNTER — Encounter: Payer: 59 | Admitting: Physician Assistant

## 2023-07-14 NOTE — Progress Notes (Signed)
Samantha, Pratt ([id number]) [id number] Page 1 of 7 Visit Report for 07/10/2023 Chief Complaint Document Details Patient Name: Date of Service: [name], [name] 07/10/2023 12:30 PM Medical Record Number: [id number] Patient Account Number: [id number] Date of Birth/Sex: Treating RN: May 05, 1971 (52 y.o. [name] Primary Care Provider: [name], [name] Other Clinician: Referring Provider: Treating Provider/Extender: [name] GO LDBECK, [place] [name] in Treatment: 15 Information Obtained from: Patient Chief Complaint Pressure ulcer left gluteal region Electronic Signature(s) Signed: 07/10/2023 12:44:44 PM By: [name] PA-C Entered By: [name] on 07/10/2023 09:44:44 -------------------------------------------------------------------------------- HPI Details Patient Name: Date of Service: [name], [name] 07/10/2023 12:30 PM Medical Record Number: [id number] Patient Account Number: [id number] Date of Birth/Sex: Treating RN: 1971/03/18 (52 y.o. [name] Primary Care Provider: [name], [name] Other Clinician: Referring Provider: Treating Provider/Extender: [name] GO [place]DB[place]K, [place] [name] in Treatment: 15 History of Present Illness HPI Description: 03-21-2023 upon evaluation today patient appears for initial evaluation regarding a wound that actually began around 03/07/2023. With that being said she has been having issues here with this area and then subsequently ended up in the hospital due to a high fever. She was discharged from the hospital and then started on oral antibiotics that was on 03-16-2023. The oral antibiotics are doxycycline and Keflex that she is currently taking. With that being said she was diagnosed with multiple sclerosis in 2000 [name] she has been in a well motorized wheelchair since 2014. She is a pleasant individual and unfortunately this is the issue that she is having currently with a pressure ulcer  although this is the good news is it is the first time she has had this happen she has an excellent caregiver that takes great care of her and the family is very attentive. 05-09-2023 upon evaluation today patient presents for initial inspection in our clinic following her hospital admission she was in the hospital for just a little over a week having just gotten out on Friday of this past week. Fortunately she seems to be doing a lot better although it is suspected that she may have possible early osteomyelitis though it was not definitive on CT scan. This CT scan was performed on 04-24-2023. She is on antibiotics at this point for that. She also did have an abscess that was cleaned out to the wound is deeper at this time. Fortunately I do not see any evidence of worsening overall which is great news. The wound is deeper but again I think it was already deep and this way it was just hidden underneath and we had not accessed the full depth of the wound previous. The patient's hospital admission was actually from 04-24-2023 through 05-05-2023. It looks like that on discharge she was placed on antibiotics due to having Pseudomonas, Proteus, and E. coli at 1 point during the admission. She did also have COVID during the admission. It looks like she was discharged with Cipro 500 mg on 05-05-2023. 05-30-2023 upon evaluation today patient was actually just discharged from the hospital she was actually in the hospital from May 18, 2023 through August 28, LYKES, Samantha L ([id number]) [phone].pdf Page 2 of 7 2024. During the course of her stay it was noted that her primary issue was acute osteomyelitis of the left pelvic region. Subsequently she does have noted a left gluteal abscess with osteomyelitis of the left ischial tuberosity. Again this wound has been present since around March 07, 2023. She was also  difficulty clearing ears. Wound Treatment Wound #1 - Gluteus Wound Laterality: Left Cleanser: Soap and Water (Home Health) 3 x Per Week/30 Days Discharge Instructions: Gently cleanse wound with antibacterial soap, rinse and pat dry prior to dressing wounds Prim Dressing: Prisma 4.34  (in) 3 x Per Week/30 Days ary Discharge Instructions: tuck into undermined area dry Secondary Dressing: ABD Pad 5x9 (in/in) 3 x Per Week/30 Days Discharge Instructions: Cover with ABD pad Secondary Dressing: hydrofera blue rope 3 x Per Week/30 Days Discharge Instructions: 1/2 stick place into wound dry and up and into tunnel Secured With: Medipore T - 63M Medipore H Soft Cloth Surgical T ape ape, 2x2 (in/yd) 3 x Per Week/30 Days Electronic Signature(s) Signed: 07/10/2023 4:48:00 PM By: [name] PA-C Signed: 07/14/2023 11:41:18 AM By: Yevonne Pax RN Previous Signature: 07/10/2023 12:43:05 PM Version By: Yevonne Pax RN [name], Averie L ([id number]) [id number] Page 4 of 7 Previous Signature: 07/10/2023 12:43:05 PM Version By: Yevonne Pax RN Entered By: Yevonne Pax on 07/10/2023 10:14:35 -------------------------------------------------------------------------------- Problem List Details Patient Name: Date of Service: MO [name], [name] 07/10/2023 12:30 PM Medical Record Number: [id number] Patient Account Number: [id number] Date of Birth/Sex: Treating RN: 1971/03/30 (52 y.o. 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 07/10/2023 12:30 PM Medical  Record Number: [id number] Patient Account Number: [id number] Date of Birth/Sex: Treating RN: 1971/02/15 (52 y.o. [name] Primary Care Provider: Bertram Denver Other Clinician: Referring Provider: Treating Provider/Extender: [name] GO [place], [place] [name] in Treatment: [address] Dash[place] L ([id number]) [id number] Page 5 of 7 Subjective Chief Complaint Information obtained from Patient Pressure ulcer left gluteal region History of Present Illness (HPI) 03-21-2023 upon evaluation today patient appears for initial evaluation regarding a wound that actually began around 03/07/2023. With that being said she has been having issues here with this area and then subsequently ended up in the hospital due to a high fever. She was discharged from the hospital and then started on oral antibiotics that was on 03-16-2023. The oral antibiotics are doxycycline and Keflex that she is currently taking. With that being said she was diagnosed with multiple sclerosis in 2000 [name] she has been in a well motorized wheelchair since 2014. She is a pleasant individual and unfortunately this is the issue that she is having currently with a pressure ulcer although this is the good news is it is the first time she has had this happen she has an excellent caregiver that takes great care of her and the family is very attentive. 05-09-2023 upon evaluation today patient presents for initial inspection in our clinic following her hospital admission she was in the hospital for just a little over a week having just gotten out on Friday of this past week. Fortunately she seems to be doing a lot better although it is suspected that she may have possible early osteomyelitis though it was not definitive on CT scan. This CT scan was performed on 04-24-2023. She is  difficulty clearing ears. Wound Treatment Wound #1 - Gluteus Wound Laterality: Left Cleanser: Soap and Water (Home Health) 3 x Per Week/30 Days Discharge Instructions: Gently cleanse wound with antibacterial soap, rinse and pat dry prior to dressing wounds Prim Dressing: Prisma 4.34  (in) 3 x Per Week/30 Days ary Discharge Instructions: tuck into undermined area dry Secondary Dressing: ABD Pad 5x9 (in/in) 3 x Per Week/30 Days Discharge Instructions: Cover with ABD pad Secondary Dressing: hydrofera blue rope 3 x Per Week/30 Days Discharge Instructions: 1/2 stick place into wound dry and up and into tunnel Secured With: Medipore T - 63M Medipore H Soft Cloth Surgical T ape ape, 2x2 (in/yd) 3 x Per Week/30 Days Electronic Signature(s) Signed: 07/10/2023 4:48:00 PM By: [name] PA-C Signed: 07/14/2023 11:41:18 AM By: Yevonne Pax RN Previous Signature: 07/10/2023 12:43:05 PM Version By: Yevonne Pax RN [name], Averie L ([id number]) [id number] Page 4 of 7 Previous Signature: 07/10/2023 12:43:05 PM Version By: Yevonne Pax RN Entered By: Yevonne Pax on 07/10/2023 10:14:35 -------------------------------------------------------------------------------- Problem List Details Patient Name: Date of Service: MO [name], [name] 07/10/2023 12:30 PM Medical Record Number: [id number] Patient Account Number: [id number] Date of Birth/Sex: Treating RN: 1971/03/30 (52 y.o. 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 07/10/2023 12:30 PM Medical  Record Number: [id number] Patient Account Number: [id number] Date of Birth/Sex: Treating RN: 1971/02/15 (52 y.o. [name] Primary Care Provider: Bertram Denver Other Clinician: Referring Provider: Treating Provider/Extender: [name] GO [place], [place] [name] in Treatment: [address] [place] L ([id number]) [id number] Page 5 of 7 Subjective Chief Complaint Information obtained from Patient Pressure ulcer left gluteal region History of Present Illness (HPI) 03-21-2023 upon evaluation today patient appears for initial evaluation regarding a wound that actually began around 03/07/2023. With that being said she has been having issues here with this area and then subsequently ended up in the hospital due to a high fever. She was discharged from the hospital and then started on oral antibiotics that was on 03-16-2023. The oral antibiotics are doxycycline and Keflex that she is currently taking. With that being said she was diagnosed with multiple sclerosis in 2000 [name] she has been in a well motorized wheelchair since 2014. She is a pleasant individual and unfortunately this is the issue that she is having currently with a pressure ulcer although this is the good news is it is the first time she has had this happen she has an excellent caregiver that takes great care of her and the family is very attentive. 05-09-2023 upon evaluation today patient presents for initial inspection in our clinic following her hospital admission she was in the hospital for just a little over a week having just gotten out on Friday of this past week. Fortunately she seems to be doing a lot better although it is suspected that she may have possible early osteomyelitis though it was not definitive on CT scan. This CT scan was performed on 04-24-2023. She is  which shows so that I can hopefully determine what the best treatment would be going forward. 07-10-2023 upon evaluation today patient appears to be actually be doing decently well currently in regard to her wound. She has been tolerating the dressing changes without complication. Fortunately I do not see any evidence of worsening overall I believe that she is making pretty good headway here towards closure although the wound is not significantly smaller from a depth perspective this is actually closing externally a little bit we will still try to keep this as open as possible using Hydrofera Blue at this  point. Electronic Signature(s) Signed: 07/10/2023 12:56:54 PM By: [name] PA-C Entered By: [name] on 07/10/2023 09:56:53 -------------------------------------------------------------------------------- Physical Exam Details Patient Name: Date of Service: [name], ZAINEB PALAS. 07/10/2023 12:30 PM Medical Record Number: [id number] Patient Account Number: [id number] Date of Birth/Sex: Treating RN: [birth year] (52 y.o. [name] Primary Care Provider: [name], [name] Other Clinician: Referring Provider: Treating Provider/Extender: [name] GO [place], [place] [name] in Treatment: 15 Constitutional Well-nourished and well-hydrated in no acute distress. Respiratory normal breathing without difficulty. Psychiatric this patient is able to make decisions and demonstrates good insight into disease process. Alert and Oriented x 3. pleasant and cooperative. Notes Upon evaluation patient's wound actually showed signs of good improvement. Fortunately I do not see any evidence of worsening overall. I believe that the patient is making headway towards closure which is good news. I really do not see anything draining significantly at the moment which is also good news. Will see where things stand at follow-up. Electronic Signature(s) Signed: 07/10/2023 12:57:40 PM By: [name] PA-C Entered By: [name] on 07/10/2023 09:57:40 [name], Drucie Opitz ([id number]) [id number] Page 3 of 7 -------------------------------------------------------------------------------- Physician Orders Details Patient Name: Date of Service: LATISH, UMBAUGH 07/10/2023 12:30 PM Medical Record Number: [id number] Patient Account Number: [id number] Date of Birth/Sex: Treating RN: May 27, 1971 (52 y.o. [name] Primary Care Provider: [name], [name] Other Clinician: Referring Provider: Treating Provider/Extender: [name] GO [place], [place] [name] in Treatment:  15 Verbal / Phone Orders: No Diagnosis Coding Follow-up Appointments Return Appointment in 1 week. Home Health Home Health Company: - [facility] fax: [phone] [facility] for wound care. May utilize formulary equivalent dressing for wound treatment orders unless otherwise specified. Home Health Nurse may visit PRN to address patients wound care needs. Scheduled days for dressing changes to be completed; exception, patient has scheduled wound care visit that day. - Wednesday, Friday **Please direct any NON-WOUND related issues/requests for orders to patient's Primary Care Physician. **If current dressing causes regression in wound condition, may D/C ordered dressing product/s and apply Normal Saline Moist Dressing daily until next Wound Healing Center or Other MD appointment. **Notify Wound Healing Center of regression in wound condition at [phone]. Bathing/ [employer] wounds with antibacterial soap and water. Off-Loading Roho cushion for wheelchair Hospital bed/mattress A fluidized (Group 3) - Home Health, please order for patient. ir Turn and reposition every 2 hours - Float heels on pillow when in the bed. Additional Orders / Instructions Follow Nutritious Diet and Increase Protein Intake Hyperbaric Oxygen Therapy Evaluate for HBO Therapy Indication and location: - osteomylitis left ishium If appropriate for treatment, begin HBOT per protocol: 2.0 ATA for 90 Minutes without A Breaks ir One treatment per day (delivered Monday through Friday unless otherwise specified in Special Instructions below): Total # of Treatments: - 40 A ntihistamine 30 minutes prior to HBO Treatment,  otherwise specified. Home Health Nurse may visit PRN to address patients wound care needs. Scheduled days for dressing changes to be completed; exception, patient has scheduled wound care visit that day. - Wednesday, Friday **Please direct any NON-WOUND related issues/requests for orders to patient's Primary Care Physician. **If current dressing causes regression in wound condition, may D/C ordered dressing product/s and apply Normal Saline Moist Dressing daily until next Wound Healing Center or Other MD appointment. **Notify Wound Healing Center of regression in wound condition at [phone]. Bathing/ Shower/ Hygiene: Wash wounds with antibacterial soap and water. Off-Loading: Roho cushion for wheelchair Hospital bed/mattress Air fluidized (Group 3) - Home Health, please  order for patient. Turn and reposition every 2 hours - Float heels on pillow when in the bed. Additional Orders / Instructions: Follow Nutritious Diet and Increase Protein Intake Hyperbaric Oxygen Therapy: Evaluate for HBO Therapy Indication and location: - osteomylitis left ishium If appropriate for treatment, begin HBOT per protocol: 2.0 ATA for 90 Minutes without Air Breaks One treatment per day (delivered Monday through Friday unless otherwise specified in Special Instructions below): T # of Treatments: - 40 otal Antihistamine 30 minutes prior to HBO Treatment, difficulty clearing ears. WOUND #1: - Gluteus Wound Laterality: Left Cleanser: Soap and Water (Home Health) 3 x Per Week/30 Days Discharge Instructions: Gently cleanse wound with antibacterial soap, rinse and pat dry prior to dressing wounds Prim Dressing: Prisma 4.34 (in) 3 x Per Week/30 Days ary Discharge Instructions: tuck into undermined area dry Secondary Dressing: ABD Pad 5x9 (in/in) 3 x Per Week/30 Days Discharge Instructions: Cover with ABD pad Secondary Dressing: hydrofera blue rope 3 x Per Week/30 Days Discharge Instructions: place into wound dry and up and into tunnel Secured With: Medipore T - 18M Medipore H Soft Cloth Surgical T ape ape, 2x2 (in/yd) 3 x Per Week/30 Days 1. I am going to recommend that we have the patient continue to monitor for any signs of infection or worsening. Based on what I am seeing I do believe that we are making good headway towards complete closure. 2. I am going to recommend that we go ahead and initiate HBO therapy this started today I am hopeful this is going actually allow this area to heal much more effectively and quickly. The patient voiced understanding. With that being said that external is closed and we still have some depth to the wound however. 3. She did tolerate her first HBO treatment in an excellent fashion. We will see patient back for reevaluation in 1 week here in the  clinic. If anything worsens or changes patient will contact our office for additional recommendations. Electronic Signature(s) Signed: 07/10/2023 12:59:04 PM By: [name] PA-C Entered By: [name] on 07/10/2023 09:59:04 [name] ([id number]) [id number] Page 7 of 7 -------------------------------------------------------------------------------- SuperBill Details Patient Name: Date of Service: MO PEARLY, LABIANCA 07/10/2023 Medical Record Number: [id number] Patient Account Number: [id number] Date of Birth/Sex: Treating RN: Jun 09, 1971 (52 y.o. 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 Wound Treatment Wound #1 - Gluteus Wound Laterality: Left Cleanser: Soap and Water (Home Health) 3 x Per Week/30 Days Discharge Instructions: Gently cleanse wound with antibacterial soap, rinse and pat dry prior to dressing wounds Prim Dressing: Prisma 4.34  (in) 3 x Per Week/30 Days ary Discharge Instructions: tuck into undermined area dry Secondary Dressing: ABD Pad 5x9 (in/in) 3 x Per Week/30 Days Discharge Instructions: Cover with ABD pad Secondary Dressing: hydrofera blue rope 3 x Per Week/30 Days Discharge Instructions: 1/2 stick place into wound dry and up and into tunnel Secured With: Medipore T - 63M Medipore H Soft Cloth Surgical T ape ape, 2x2 (in/yd) 3 x Per Week/30 Days Electronic Signature(s) Signed: 07/10/2023 4:48:00 PM By: [name] PA-C Signed: 07/14/2023 11:41:18 AM By: Yevonne Pax RN Previous Signature: 07/10/2023 12:43:05 PM Version By: Yevonne Pax RN [name], Averie L ([id number]) [id number] Page 4 of 7 Previous Signature: 07/10/2023 12:43:05 PM Version By: Yevonne Pax RN Entered By: Yevonne Pax on 07/10/2023 10:14:35 -------------------------------------------------------------------------------- Problem List Details Patient Name: Date of Service: MO [name], [name] 07/10/2023 12:30 PM Medical Record Number: [id number] Patient Account Number: [id number] Date of Birth/Sex: Treating RN: 1971/03/30 (52 y.o. 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 07/10/2023 12:30 PM Medical  Record Number: [id number] Patient Account Number: [id number] Date of Birth/Sex: Treating RN: 1971/02/15 (52 y.o. [name] Primary Care Provider: Bertram Denver Other Clinician: Referring Provider: Treating Provider/Extender: [name] GO [place], [place] [name] in Treatment: [address] [place] L ([id number]) [id number] Page 5 of 7 Subjective Chief Complaint Information obtained from Patient Pressure ulcer left gluteal region History of Present Illness (HPI) 03-21-2023 upon evaluation today patient appears for initial evaluation regarding a wound that actually began around 03/07/2023. With that being said she has been having issues here with this area and then subsequently ended up in the hospital due to a high fever. She was discharged from the hospital and then started on oral antibiotics that was on 03-16-2023. The oral antibiotics are doxycycline and Keflex that she is currently taking. With that being said she was diagnosed with multiple sclerosis in 2000 [name] she has been in a well motorized wheelchair since 2014. She is a pleasant individual and unfortunately this is the issue that she is having currently with a pressure ulcer although this is the good news is it is the first time she has had this happen she has an excellent caregiver that takes great care of her and the family is very attentive. 05-09-2023 upon evaluation today patient presents for initial inspection in our clinic following her hospital admission she was in the hospital for just a little over a week having just gotten out on Friday of this past week. Fortunately she seems to be doing a lot better although it is suspected that she may have possible early osteomyelitis though it was not definitive on CT scan. This CT scan was performed on 04-24-2023. She is  difficulty clearing ears. Wound Treatment Wound #1 - Gluteus Wound Laterality: Left Cleanser: Soap and Water (Home Health) 3 x Per Week/30 Days Discharge Instructions: Gently cleanse wound with antibacterial soap, rinse and pat dry prior to dressing wounds Prim Dressing: Prisma 4.34  (in) 3 x Per Week/30 Days ary Discharge Instructions: tuck into undermined area dry Secondary Dressing: ABD Pad 5x9 (in/in) 3 x Per Week/30 Days Discharge Instructions: Cover with ABD pad Secondary Dressing: hydrofera blue rope 3 x Per Week/30 Days Discharge Instructions: 1/2 stick place into wound dry and up and into tunnel Secured With: Medipore T - 63M Medipore H Soft Cloth Surgical T ape ape, 2x2 (in/yd) 3 x Per Week/30 Days Electronic Signature(s) Signed: 07/10/2023 4:48:00 PM By: [name] PA-C Signed: 07/14/2023 11:41:18 AM By: Yevonne Pax RN Previous Signature: 07/10/2023 12:43:05 PM Version By: Yevonne Pax RN [name], Averie L ([id number]) [id number] Page 4 of 7 Previous Signature: 07/10/2023 12:43:05 PM Version By: Yevonne Pax RN Entered By: Yevonne Pax on 07/10/2023 10:14:35 -------------------------------------------------------------------------------- Problem List Details Patient Name: Date of Service: MO [name], [name] 07/10/2023 12:30 PM Medical Record Number: [id number] Patient Account Number: [id number] Date of Birth/Sex: Treating RN: 1971/03/30 (52 y.o. 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 07/10/2023 12:30 PM Medical  Record Number: [id number] Patient Account Number: [id number] Date of Birth/Sex: Treating RN: 1971/02/15 (52 y.o. [name] Primary Care Provider: Bertram Denver Other Clinician: Referring Provider: Treating Provider/Extender: [name] GO LDBECK, [place] [name] in Treatment: [address] [place] L ([id number]) [id number] Page 5 of 7 Subjective Chief Complaint Information obtained from Patient Pressure ulcer left gluteal region History of Present Illness (HPI) 03-21-2023 upon evaluation today patient appears for initial evaluation regarding a wound that actually began around 03/07/2023. With that being said she has been having issues here with this area and then subsequently ended up in the hospital due to a high fever. She was discharged from the hospital and then started on oral antibiotics that was on 03-16-2023. The oral antibiotics are doxycycline and Keflex that she is currently taking. With that being said she was diagnosed with multiple sclerosis in 2000 [name] she has been in a well motorized wheelchair since 2014. She is a pleasant individual and unfortunately this is the issue that she is having currently with a pressure ulcer although this is the good news is it is the first time she has had this happen she has an excellent caregiver that takes great care of her and the family is very attentive. 05-09-2023 upon evaluation today patient presents for initial inspection in our clinic following her hospital admission she was in the hospital for just a little over a week having just gotten out on Friday of this past week. Fortunately she seems to be doing a lot better although it is suspected that she may have possible early osteomyelitis though it was not definitive on CT scan. This CT scan was performed on 04-24-2023. She is  which shows so that I can hopefully determine what the best treatment would be going forward. 07-10-2023 upon evaluation today patient appears to be actually be doing decently well currently in regard to her wound. She has been tolerating the dressing changes without complication. Fortunately I do not see any evidence of worsening overall I believe that she is making pretty good headway here towards closure although the wound is not significantly smaller from a depth perspective this is actually closing externally a little bit we will still try to keep this as open as possible using Hydrofera Blue at this  point. Electronic Signature(s) Signed: 07/10/2023 12:56:54 PM By: [name] PA-C Entered By: [name] on 07/10/2023 09:56:53 -------------------------------------------------------------------------------- Physical Exam Details Patient Name: Date of Service: [name], ZAINEB PALAS. 07/10/2023 12:30 PM Medical Record Number: [id number] Patient Account Number: [id number] Date of Birth/Sex: Treating RN: [birth year] (52 y.o. [name] Primary Care Provider: [name], [name] Other Clinician: Referring Provider: Treating Provider/Extender: [name] GO LDBECK, [place] [name] in Treatment: 15 Constitutional Well-nourished and well-hydrated in no acute distress. Respiratory normal breathing without difficulty. Psychiatric this patient is able to make decisions and demonstrates good insight into disease process. Alert and Oriented x 3. pleasant and cooperative. Notes Upon evaluation patient's wound actually showed signs of good improvement. Fortunately I do not see any evidence of worsening overall. I believe that the patient is making headway towards closure which is good news. I really do not see anything draining significantly at the moment which is also good news. Will see where things stand at follow-up. Electronic Signature(s) Signed: 07/10/2023 12:57:40 PM By: [name] PA-C Entered By: [name] on 07/10/2023 09:57:40 [name], Drucie Opitz ([id number]) [id number] Page 3 of 7 -------------------------------------------------------------------------------- Physician Orders Details Patient Name: Date of Service: LATISH, UMBAUGH 07/10/2023 12:30 PM Medical Record Number: [id number] Patient Account Number: [id number] Date of Birth/Sex: Treating RN: May 27, 1971 (52 y.o. [name] Primary Care Provider: [name], [name] Other Clinician: Referring Provider: Treating Provider/Extender: [name] GO LDBECK, [place] [name] in Treatment:  15 Verbal / Phone Orders: No Diagnosis Coding Follow-up Appointments Return Appointment in 1 week. Home Health Home Health Company: - [facility] fax: [phone] [facility] for wound care. May utilize formulary equivalent dressing for wound treatment orders unless otherwise specified. Home Health Nurse may visit PRN to address patients wound care needs. Scheduled days for dressing changes to be completed; exception, patient has scheduled wound care visit that day. - Wednesday, Friday **Please direct any NON-WOUND related issues/requests for orders to patient's Primary Care Physician. **If current dressing causes regression in wound condition, may D/C ordered dressing product/s and apply Normal Saline Moist Dressing daily until next Wound Healing Center or Other MD appointment. **Notify Wound Healing Center of regression in wound condition at [phone]. Bathing/ [employer] wounds with antibacterial soap and water. Off-Loading Roho cushion for wheelchair Hospital bed/mattress A fluidized (Group 3) - Home Health, please order for patient. ir Turn and reposition every 2 hours - Float heels on pillow when in the bed. Additional Orders / Instructions Follow Nutritious Diet and Increase Protein Intake Hyperbaric Oxygen Therapy Evaluate for HBO Therapy Indication and location: - osteomylitis left ishium If appropriate for treatment, begin HBOT per protocol: 2.0 ATA for 90 Minutes without A Breaks ir One treatment per day (delivered Monday through Friday unless otherwise specified in Special Instructions below): Total # of Treatments: - 40 A ntihistamine 30 minutes prior to HBO Treatment,

## 2023-07-14 NOTE — Progress Notes (Signed)
[name], [name] ([id number]) [id number] Page 1 of 2 Visit Report for 07/13/2023 HBO Details Patient Name: Date of Service: [name], [name] 07/13/2023 10:00 A M Medical Record Number: [id number] Patient Account Number: [id number] Date of Birth/Sex: Treating RN: 08/10/1971 (52 y.o. 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, HBO Treatment Details Treatment Number: 4 Patient Type: Outpatient Chamber Type: Monoplace Chamber Serial #: [id number] Treatment Protocol: 2.0 ATA with 90 minutes oxygen, and no air breaks Treatment Details Compression Rate Down: 2.0 psi / minute De-Compression Rate Up: 1.5 psi / minute Air breaks and breathing Decompress Decompress Compress Tx Pressure Begins Reached periods Begins Ends (leave unused spaces blank) Chamber Pressure (ATA 1 2 ------2 1 ) Clock Time (24 hr) 9:59 10:12 - - - - - - 11:42 11:52 Treatment Length: 113 (minutes) Treatment Segments: 4 Vital Signs Capillary Blood Glucose Reference Range: 80 - 120 mg / dl HBO Diabetic Blood Glucose Intervention Range: <131 mg/dl or >440 mg/dl Time Vitals Blood Respiratory Capillary Blood Glucose Pulse Action Type: Pulse: Temperature: Taken: Pressure: Rate: Glucose (mg/dl): Meter #: Oximetry (%) Taken: Pre 09:54 120/68 68 16 98 99 Treatment Response Treatment Toleration: Well Treatment Completion Status: Treatment Completed without Adverse Event Electronic Signature(s) Signed: 07/13/2023 3:58:20 PM By: Demetria Pore Signed: 07/13/2023 5:31:22 PM By: [name] PA-C Entered By: Demetria Pore on  07/13/2023 14:52:11 Kassebaum, Drucie Opitz ([id number]) [id number] Page 2 of 2 -------------------------------------------------------------------------------- HBO Safety Checklist Details Patient Name: Date of Service: [name], [name] 07/13/2023 10:00 A M Medical Record Number: [id number] Patient Account Number: [id number] Date of Birth/Sex: Treating RN: 1970-09-29 (52 y.o. F) Primary Care Bronnie Vasseur: Bertram Denver Other Clinician: Referring Winslow Ederer: Treating Feleica Fulmore/Extender: [name] GO [place]K, [place] [name] in Treatment: 16 HBO Safety Checklist Items Safety Checklist Consent Form Signed Patient voided / foley secured and emptied When did you last eato 07/13/23 Last dose of injectable or oral agent Ostomy pouch emptied and vented if applicable NA All implantable devices assessed, documented and approved NA Intravenous access site secured and place NA Valuables secured Linens and cotton and cotton/polyester blend (less than 51% polyester) Personal oil-based products / skin lotions / body lotions removed Wigs or hairpieces removed NA Smoking or tobacco materials removed Books / newspapers / magazines / loose paper removed Cologne, aftershave, perfume and deodorant removed Jewelry removed (may wrap wedding band) Make-up removed NA Hair care products removed Battery operated devices (external) removed NA Heating patches and chemical warmers removed NA Titanium eyewear removed NA Nail polish cured greater than 10 hours NA Casting material cured greater than 10 hours NA Hearing aids removed NA Loose dentures or partials removed NA Prosthetics have been removed NA Patient demonstrates correct use of air break device (if applicable) Patient concerns have been addressed Patient grounding bracelet on and cord attached to chamber Specifics for Inpatients (complete in addition to above) Medication sheet sent with patient NA Intravenous  medications needed or due during therapy sent with patient NA Drainage tubes (e.g. nasogastric tube or chest tube secured and vented) NA Endotracheal or Tracheotomy tube secured NA Cuff deflated of air and inflated with saline NA Airway suctioned NA Electronic Signature(s) Signed: 07/13/2023 3:58:20 PM By: Lynnell Jude,  Kim Entered By: Demetria Pore on 07/13/2023 14:51:06

## 2023-07-14 NOTE — Progress Notes (Signed)
RENNE, PITA ([id number]) [phone].pdf Page 1 of 2 Visit Report for 07/13/2023 Problem List Details Patient Name: Date of Service: [name], [name] 07/13/2023 10:00 A M Medical Record Number: [id number] Patient Account Number: [id number] Date of Birth/Sex: Treating RN: 08-12-1971 (52 y.o. 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 07/13/2023 Medical Record Number: [id number] Patient Account Number: [id number] Date of Birth/Sex: Treating RN: [birth year] (52 y.o. 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, stage 4 G35 Multiple sclerosis Facility Procedures : CPT4 Code: [id number] Description: (Facility Use Only) HBOT full body chamber, , Modifier: Quantity: 4 Physician Procedures : CPT4: Description Modifier Code [id number] [id number] Physician attendance and supervision of  hyperbaric oxygen therapy, per sessionsupervision of hyperbaric Oxygen therapy, per session ICD-10 Diagnosis Description 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

## 2023-07-17 ENCOUNTER — Encounter: Payer: 59 | Admitting: Physician Assistant

## 2023-07-17 ENCOUNTER — Ambulatory Visit
Admit: 2023-07-17 | Discharge: 2023-08-01 | Disposition: A | Discharge status: Skilled Nursing Facility | Payer: MEDICARE | Admitting: Infectious Disease

## 2023-07-17 ENCOUNTER — Ambulatory Visit: Admit: 2023-07-17 | Payer: MEDICARE

## 2023-07-18 ENCOUNTER — Encounter: Payer: 59 | Admitting: Physician Assistant

## 2023-07-19 ENCOUNTER — Encounter: Payer: 59 | Admitting: Internal Medicine

## 2023-07-20 ENCOUNTER — Encounter: Payer: 59 | Admitting: Physician Assistant

## 2023-07-20 MED ORDER — CIPROFLOXACIN 750 MG TABLET
ORAL_TABLET | Freq: Two times a day (BID) | ORAL | 0 refills | 14 days
Start: 2023-07-20 — End: 2023-08-03

## 2023-07-21 ENCOUNTER — Encounter: Payer: 59 | Admitting: Physician Assistant

## 2023-07-24 ENCOUNTER — Encounter: Payer: 59 | Admitting: Physician Assistant

## 2023-07-25 ENCOUNTER — Encounter: Payer: 59 | Admitting: Physician Assistant

## 2023-07-26 ENCOUNTER — Encounter: Payer: 59 | Admitting: Internal Medicine

## 2023-07-27 ENCOUNTER — Encounter: Payer: 59 | Admitting: Physician Assistant

## 2023-07-28 ENCOUNTER — Encounter: Payer: 59 | Admitting: Physician Assistant

## 2023-07-31 ENCOUNTER — Encounter: Payer: 59 | Admitting: Physician Assistant

## 2023-08-01 ENCOUNTER — Encounter: Payer: 59 | Admitting: Physician Assistant

## 2023-08-02 ENCOUNTER — Encounter: Payer: 59 | Admitting: Internal Medicine

## 2023-08-03 ENCOUNTER — Encounter: Payer: 59 | Admitting: Physician Assistant

## 2023-08-04 ENCOUNTER — Encounter: Payer: 59 | Admitting: Physician Assistant

## 2023-08-07 ENCOUNTER — Encounter: Payer: 59 | Admitting: Physician Assistant

## 2023-08-07 ENCOUNTER — Ambulatory Visit: Admit: 2023-08-07 | Discharge: 2023-08-07 | Payer: MEDICARE

## 2023-08-07 ENCOUNTER — Ambulatory Visit
Admit: 2023-08-07 | Discharge: 2023-08-07 | Payer: MEDICARE | Attending: Infectious Disease | Primary: Infectious Disease

## 2023-08-07 DIAGNOSIS — Z792 Long term (current) use of antibiotics: Principal | ICD-10-CM

## 2023-08-07 DIAGNOSIS — L89323 Pressure ulcer of left buttock, stage 3: Principal | ICD-10-CM

## 2023-08-08 ENCOUNTER — Encounter: Payer: 59 | Admitting: Physician Assistant

## 2023-08-09 ENCOUNTER — Encounter: Payer: 59 | Admitting: Internal Medicine

## 2023-08-10 ENCOUNTER — Encounter: Payer: 59 | Admitting: Physician Assistant

## 2023-08-11 ENCOUNTER — Encounter: Payer: 59 | Admitting: Physician Assistant

## 2023-08-14 ENCOUNTER — Encounter: Payer: 59 | Admitting: Physician Assistant

## 2023-08-14 ENCOUNTER — Ambulatory Visit: Payer: 59 | Admitting: Physician Assistant

## 2023-08-15 ENCOUNTER — Encounter: Payer: 59 | Admitting: Physician Assistant

## 2023-08-16 ENCOUNTER — Encounter: Payer: 59 | Admitting: Internal Medicine

## 2023-08-17 ENCOUNTER — Encounter: Payer: 59 | Admitting: Physician Assistant

## 2023-08-18 ENCOUNTER — Encounter: Payer: 59 | Admitting: Physician Assistant

## 2023-08-21 ENCOUNTER — Ambulatory Visit: Payer: 59 | Admitting: Physician Assistant

## 2023-08-21 ENCOUNTER — Encounter: Payer: 59 | Admitting: Physician Assistant

## 2023-08-22 ENCOUNTER — Encounter: Payer: 59 | Admitting: Physician Assistant

## 2023-08-25 ENCOUNTER — Encounter: Payer: 59 | Admitting: Physician Assistant

## 2023-08-28 ENCOUNTER — Encounter: Payer: 59 | Admitting: Physician Assistant

## 2023-08-29 ENCOUNTER — Encounter: Payer: 59 | Admitting: Physician Assistant

## 2023-08-31 ENCOUNTER — Encounter: Payer: 59 | Admitting: Physician Assistant

## 2023-08-31 ENCOUNTER — Telehealth
Admit: 2023-08-31 | Discharge: 2023-09-01 | Payer: MEDICARE | Attending: Physician Assistant | Primary: Physician Assistant

## 2023-08-31 DIAGNOSIS — G35 Multiple sclerosis: Principal | ICD-10-CM

## 2023-08-31 DIAGNOSIS — Z90710 Acquired absence of both cervix and uterus: Principal | ICD-10-CM

## 2023-08-31 MED ORDER — AMANTADINE HCL 100 MG CAPSULE
ORAL_CAPSULE | Freq: Two times a day (BID) | ORAL | 3 refills | 90 days | Status: CP
Start: 2023-08-31 — End: ?

## 2023-08-31 MED ORDER — BACLOFEN 20 MG TABLET
ORAL_TABLET | Freq: Four times a day (QID) | ORAL | 3 refills | 90 days | Status: CP
Start: 2023-08-31 — End: ?

## 2023-08-31 MED ORDER — GABAPENTIN 100 MG CAPSULE
ORAL_CAPSULE | 3 refills | 0 days | Status: CP
Start: 2023-08-31 — End: ?

## 2023-08-31 MED ORDER — GABAPENTIN 800 MG TABLET
ORAL_TABLET | Freq: Every evening | ORAL | 3 refills | 90 days | Status: CP
Start: 2023-08-31 — End: ?

## 2023-09-01 ENCOUNTER — Encounter: Payer: 59 | Admitting: Physician Assistant

## 2023-09-03 MED ORDER — PANTOPRAZOLE 40 MG TABLET,DELAYED RELEASE
ORAL_TABLET | Freq: Every day | ORAL | 0 refills | 0 days
Start: 2023-09-03 — End: ?

## 2023-09-08 ENCOUNTER — Ambulatory Visit: Admit: 2023-09-08 | Discharge: 2023-09-09 | Payer: MEDICARE

## 2023-09-08 DIAGNOSIS — M8668 Other chronic osteomyelitis, other site: Principal | ICD-10-CM

## 2023-09-08 DIAGNOSIS — L89323 Pressure ulcer of left buttock, stage 3: Principal | ICD-10-CM

## 2023-09-12 MED ORDER — PANTOPRAZOLE 40 MG TABLET,DELAYED RELEASE
ORAL_TABLET | Freq: Every day | ORAL | 0 refills | 30.00 days
Start: 2023-09-12 — End: ?

## 2023-09-26 DIAGNOSIS — G35 Multiple sclerosis: Principal | ICD-10-CM

## 2023-09-26 DIAGNOSIS — G8220 Paraplegia, unspecified: Principal | ICD-10-CM

## 2023-09-26 DIAGNOSIS — L89323 Pressure ulcer of left buttock, stage 3: Principal | ICD-10-CM

## 2023-09-26 DIAGNOSIS — L0231 Cutaneous abscess of buttock: Principal | ICD-10-CM

## 2023-10-01 MED ORDER — PANTOPRAZOLE 40 MG TABLET,DELAYED RELEASE
ORAL_TABLET | Freq: Every day | ORAL | 0 refills | 0.00 days
Start: 2023-10-01 — End: ?

## 2023-10-02 MED ORDER — PANTOPRAZOLE 40 MG TABLET,DELAYED RELEASE
ORAL_TABLET | Freq: Every day | ORAL | 3 refills | 90.00 days | Status: CP
Start: 2023-10-02 — End: ?

## 2023-12-08 ENCOUNTER — Emergency Department

## 2023-12-08 ENCOUNTER — Other Ambulatory Visit: Payer: Self-pay

## 2023-12-08 ENCOUNTER — Observation Stay
Admission: EM | Admit: 2023-12-08 | Discharge: 2023-12-09 | Disposition: A | Discharge status: Home / Self Care | Attending: Obstetrics and Gynecology | Admitting: Obstetrics and Gynecology

## 2023-12-08 DIAGNOSIS — E669 Obesity, unspecified: Secondary | ICD-10-CM | POA: Diagnosis present

## 2023-12-08 DIAGNOSIS — Z79899 Other long term (current) drug therapy: Secondary | ICD-10-CM | POA: Diagnosis not present

## 2023-12-08 DIAGNOSIS — R4781 Slurred speech: Secondary | ICD-10-CM | POA: Diagnosis present

## 2023-12-08 DIAGNOSIS — Z91040 Latex allergy status: Secondary | ICD-10-CM | POA: Diagnosis not present

## 2023-12-08 DIAGNOSIS — Z936 Other artificial openings of urinary tract status: Secondary | ICD-10-CM | POA: Insufficient documentation

## 2023-12-08 DIAGNOSIS — G8220 Paraplegia, unspecified: Secondary | ICD-10-CM | POA: Diagnosis present

## 2023-12-08 DIAGNOSIS — I5032 Chronic diastolic (congestive) heart failure: Secondary | ICD-10-CM | POA: Diagnosis present

## 2023-12-08 DIAGNOSIS — R29818 Other symptoms and signs involving the nervous system: Secondary | ICD-10-CM | POA: Diagnosis not present

## 2023-12-08 DIAGNOSIS — N390 Urinary tract infection, site not specified: Secondary | ICD-10-CM | POA: Diagnosis not present

## 2023-12-08 DIAGNOSIS — G35 Multiple sclerosis: Secondary | ICD-10-CM | POA: Diagnosis not present

## 2023-12-08 DIAGNOSIS — Z9889 Other specified postprocedural states: Secondary | ICD-10-CM

## 2023-12-08 DIAGNOSIS — Z87440 Personal history of urinary (tract) infections: Secondary | ICD-10-CM | POA: Diagnosis not present

## 2023-12-08 DIAGNOSIS — N319 Neuromuscular dysfunction of bladder, unspecified: Secondary | ICD-10-CM | POA: Diagnosis present

## 2023-12-08 DIAGNOSIS — Z6830 Body mass index (BMI) 30.0-30.9, adult: Secondary | ICD-10-CM | POA: Diagnosis not present

## 2023-12-08 LAB — CBC
HCT: 52.4 % — ABNORMAL HIGH (ref 36.0–46.0)
Hemoglobin: 17.4 g/dL — ABNORMAL HIGH (ref 12.0–15.0)
MCH: 28.7 pg (ref 26.0–34.0)
MCHC: 33.2 g/dL (ref 30.0–36.0)
MCV: 86.5 fL (ref 80.0–100.0)
Platelets: 366 10*3/uL (ref 150–400)
RBC: 6.06 MIL/uL — ABNORMAL HIGH (ref 3.87–5.11)
RDW: 14.7 % (ref 11.5–15.5)
WBC: 7.5 10*3/uL (ref 4.0–10.5)
nRBC: 0 % (ref 0.0–0.2)

## 2023-12-08 LAB — DIFFERENTIAL
Abs Immature Granulocytes: 0.02 10*3/uL (ref 0.00–0.07)
Basophils Absolute: 0.1 10*3/uL (ref 0.0–0.1)
Basophils Relative: 1 %
Eosinophils Absolute: 0.1 10*3/uL (ref 0.0–0.5)
Eosinophils Relative: 1 %
Immature Granulocytes: 0 %
Lymphocytes Relative: 45 %
Lymphs Abs: 3.4 10*3/uL (ref 0.7–4.0)
Monocytes Absolute: 0.4 10*3/uL (ref 0.1–1.0)
Monocytes Relative: 5 %
Neutro Abs: 3.6 10*3/uL (ref 1.7–7.7)
Neutrophils Relative %: 48 %

## 2023-12-08 LAB — COMPREHENSIVE METABOLIC PANEL
ALT: 18 U/L (ref 0–44)
AST: 16 U/L (ref 15–41)
Albumin: 5.5 g/dL — ABNORMAL HIGH (ref 3.5–5.0)
Alkaline Phosphatase: 65 U/L (ref 38–126)
Anion gap: 8 (ref 5–15)
BUN: 12 mg/dL (ref 6–20)
CO2: 24 mmol/L (ref 22–32)
Calcium: 10.4 mg/dL — ABNORMAL HIGH (ref 8.9–10.3)
Chloride: 105 mmol/L (ref 98–111)
Creatinine, Ser: 0.61 mg/dL (ref 0.44–1.00)
GFR, Estimated: >60 mL/min (ref >60)
Glucose, Bld: 92 mg/dL (ref 70–99)
Potassium: 4.3 mmol/L (ref 3.5–5.1)
Sodium: 137 mmol/L (ref 135–145)
Total Bilirubin: 1.1 mg/dL (ref 0.0–1.2)
Total Protein: 10.3 g/dL — ABNORMAL HIGH (ref 6.5–8.1)

## 2023-12-08 LAB — URINALYSIS, ROUTINE W REFLEX MICROSCOPIC
Bilirubin Urine: NEGATIVE
Glucose, UA: NEGATIVE mg/dL
Ketones, ur: 5 mg/dL — AB
Nitrite: POSITIVE — AB
Protein, ur: 100 mg/dL — AB
Specific Gravity, Urine: 1.012 (ref 1.005–1.030)
Squamous Epithelial / HPF: 0 /HPF (ref 0–5)
WBC, UA: >50 WBC/hpf (ref 0–5)
pH: 6 (ref 5.0–8.0)

## 2023-12-08 LAB — APTT: aPTT: 31 s (ref 24–36)

## 2023-12-08 LAB — ETHANOL: Alcohol, Ethyl (B): <10 mg/dL (ref <10)

## 2023-12-08 LAB — PROTIME-INR
INR: 1 (ref 0.8–1.2)
Prothrombin Time: 13.3 s (ref 11.4–15.2)

## 2023-12-08 MED ORDER — GADOBUTROL 1 MMOL/ML IV SOLN
7.5000 mL | Freq: Once | INTRAVENOUS | Status: AC | PRN
  Administered 2023-12-08: 7.5 mL via INTRAVENOUS

## 2023-12-08 MED ORDER — SODIUM CHLORIDE 0.9% FLUSH: 3.0000 mL | Freq: Once | INTRAVENOUS | Status: DC

## 2023-12-08 MED ORDER — SODIUM CHLORIDE 0.9 % IV SOLN
2.0000 g | Freq: Once | INTRAVENOUS | Status: AC
  Administered 2023-12-08: 2 g via INTRAVENOUS
  Filled 2023-12-08: qty 12.5

## 2023-12-08 MED ORDER — SODIUM CHLORIDE 0.9 % IV BOLUS
500.0000 mL | Freq: Once | INTRAVENOUS | Status: AC
  Administered 2023-12-08: 500 mL via INTRAVENOUS

## 2023-12-08 NOTE — H&P (Signed)
 History and Physical    Patient: [name] JXB:[id number] DOB: 03-14-1971 DOA: 12/08/2023 DOS: the patient was seen and examined on 12/08/2023 PCP: [name], MD  Patient coming from: Home  Chief Complaint:  Chief Complaint  Patient presents with   Aphasia   Chills    HPI: [name] is a 53 y.o. female with medical history significant for Multiple sclerosis with paraplegia and neurogenic bladder s/p ileal conduit, diastolic CHF, neuropathic pain, depression and prior UTIs (Klebsiella, Pseudomonas, Proteus), who presents to the ED with a 1 day history of slurred speech and dysarthria .  She also has chills but no fever.  States she has presented similarly in the past when she had a UTI.  Has chronic left upper extremity weakness with no recent worsening.  Otherwise at baseline except for new dysarthria ED course and data review: Vitals within normal limits Labs with normal WBC but with urine consistent with UTI.  Other labs unremarkable EKG, personally viewed and interpreted showing sinus at 73 with no acute ST-T wave changes MRI brain showing stable findings since 12/2022 consistent with known multiple sclerosis and a 6 mm meningioma at the left frontal convexity unchanged Patient was treated with an IV fluid bolus given a dose of cefepime for UTI possibly causing recrudescence of MS symptoms versus MS flare, similar presentation to prior admissions for UTI. Hospitalist consulted for admission..   Review of Systems: As mentioned in the history of present illness. All other systems reviewed and are negative.  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 She has never used smokeless tobacco. She reports that she does not currently use alcohol. She reports that she does not use drugs.  Allergies  Allergen Reactions   Latex Swelling and Other (See Comments)    Reaction:  Facial swelling     Family History  Problem Relation Age of Onset   Osteoarthritis Mother    Hypertension Mother    Hyperlipidemia Mother    Thyroid disease Mother    Liver cancer Father    Asthma Brother     Prior to Admission medications   Medication Sig Start Date End Date Taking? Authorizing Provider  amantadine (SYMMETREL) 100 MG capsule Take 200 mg by mouth 2 (two) times daily.   Yes [provider]  baclofen (LIORESAL) 20 MG tablet Take 20 mg by mouth 4 (four) times daily. 08/31/23  Yes [provider]  DULoxetine (CYMBALTA) 60 MG capsule Take 60 mg by mouth daily. 01/09/23 01/04/24 Yes [provider]  gabapentin (NEURONTIN) 100 MG capsule Take 200 mg by mouth every morning. 06/17/21  Yes [provider]  gabapentin (NEURONTIN) 800 MG tablet Take 800 mg by mouth at bedtime. 07/08/21  Yes [provider]  pantoprazole (PROTONIX) 40 MG tablet Take 1 tablet by mouth daily. 10/02/23  Yes [provider]  acetaminophen (TYLENOL) 500 MG tablet Take 500 mg by mouth every 6 (six) hours as needed.  [provider]  albuterol (VENTOLIN HFA) 108 (90 Base) MCG/ACT inhaler Inhale 2 puffs into the lungs every 6 (six) hours as needed for wheezing or shortness of breath. 05/05/23   Tresa Virella, MD  benzonatate (TESSALON) 200 MG capsule Take 1 capsule (200 mg total) by mouth 3 (three) times daily. Patient not taking: Reported on 12/08/2023 05/05/23   Tresa Sibilia, MD  cholecalciferol (VITAMIN D) 1000 units  tablet Take 1,000 Units by mouth daily.    [provider]  Cranberry 500 MG CHEW Chew 500 mg by mouth daily.    [provider]  gentamicin cream (GARAMYCIN) 0.1 % Apply 1 Application topically daily. Patient not taking: Reported on 12/08/2023 04/11/23   [provider]  guaiFENesin (MUCINEX) 600 MG 12 hr tablet Take 2 tablets (1,200 mg total) by mouth 2 (two) times daily. 05/05/23   Tresa Mccullum, MD  lactulose (CHRONULAC) 10 GM/15ML solution Take 15 mLs by mouth daily as needed for mild constipation. Patient not taking: Reported on 12/08/2023 04/10/23   [provider]  OZEMPIC, 1 MG/DOSE, 4 MG/3ML SOPN Inject 1 mg into the skin once a week. On Monday 09/02/21   [provider]  pantoprazole (PROTONIX) 40 MG tablet Take 1 tablet (40 mg total) by mouth 2 (two) times daily. 01/14/23   Sunnie Nielsen, DO  senna (SENOKOT) 8.6 MG tablet Take 2 tablets by mouth 2 (two) times daily. 06/24/22   [provider]  Silver (AQUACEL AG FOAM) [phone]" PADS Apply 1 each topically daily in the afternoon. 03/16/23   Wouk, Wilfred Curtis, MD  STIMULANT LAXATIVE 8.6-50 MG tablet Take 1 tablet by mouth at bedtime. 11/07/21   [provider]  Wound Dressings (FOAM DRESSING CIRCULAR BORDER) PADS Apply 1 each topically every 3 (three) days. 03/16/23   Kathrynn Running, MD    Physical Exam: Vitals:   12/08/23 1600 12/08/23 1740 12/08/23 1910 12/08/23 2051  BP:  137/75  129/67  Pulse: 72 75  77  Resp:  17  16  Temp:   98.1 F (36.7 C) (!) 97.5 F (36.4 C)  TempSrc:   Oral Oral  SpO2: 100% 100%  100%   Physical Exam Vitals and nursing note reviewed.  Constitutional:      General: She is not in acute distress. HENT:     Head: Normocephalic and atraumatic.  Cardiovascular:     Rate and Rhythm: Normal rate and regular rhythm.     Heart sounds: Normal heart sounds.  Pulmonary:     Effort: Pulmonary effort is normal.     Breath sounds: Normal breath  sounds.  Abdominal:     Palpations: Abdomen is soft.     Tenderness: There is no abdominal tenderness.  Neurological:     Cranial Nerves: Dysarthria present.     Labs on Admission: I have personally reviewed following labs and imaging studies  CBC: Recent Labs  Lab 12/08/23 1510  WBC 7.5  NEUTROABS 3.6  HGB 17.4*  HCT 52.4*  MCV 86.5  PLT 366   Basic Metabolic Panel: Recent Labs  Lab 12/08/23 1510  NA 137  K 4.3  CL 105  CO2 24  GLUCOSE 92  BUN 12  CREATININE 0.61  CALCIUM 10.4*   GFR: CrCl cannot be calculated (Unknown ideal weight.). Liver Function Tests: Recent Labs  Lab 12/08/23 1510  AST 16  ALT 18  ALKPHOS 65  BILITOT 1.1  PROT 10.3*  ALBUMIN 5.5*   No results  for input(s): "LIPASE", "AMYLASE" in the last 168 hours. No results for input(s): "AMMONIA" in the last 168 hours. Coagulation Profile: Recent Labs  Lab 12/08/23 1510  INR 1.0   Cardiac Enzymes: No results for input(s): "CKTOTAL", "CKMB", "CKMBINDEX", "TROPONINI" in the last 168 hours. BNP (last 3 results) No results for input(s): "PROBNP" in the last 8760 hours. HbA1C: No results for input(s): "HGBA1C" in the last 72 hours. CBG: No results for input(s): "GLUCAP" in the last 168 hours. Lipid Profile: No results for input(s): "CHOL", "HDL", "LDLCALC", "TRIG", "CHOLHDL", "LDLDIRECT" in the last 72 hours. Thyroid Function Tests: No results for input(s): "TSH", "T4TOTAL", "FREET4", "T3FREE", "THYROIDAB" in the last 72 hours. Anemia Panel: No results for input(s): "VITAMINB12", "FOLATE", "FERRITIN", "TIBC", "IRON", "RETICCTPCT" in the last 72 hours. Urine analysis:    Component Value Date/Time   COLORURINE YELLOW (A) 12/08/2023 2102   APPEARANCEUR TURBID (A) 12/08/2023 2102   APPEARANCEUR Cloudy 10/05/2014 2100   LABSPEC 1.012 12/08/2023 2102   LABSPEC 1.009 10/05/2014 2100   PHURINE 6.0 12/08/2023 2102   GLUCOSEU NEGATIVE 12/08/2023 2102   GLUCOSEU Negative 10/05/2014 2100    HGBUR SMALL (A) 12/08/2023 2102   BILIRUBINUR NEGATIVE 12/08/2023 2102   BILIRUBINUR Negative 10/05/2014 2100   KETONESUR 5 (A) 12/08/2023 2102   PROTEINUR 100 (A) 12/08/2023 2102   NITRITE POSITIVE (A) 12/08/2023 2102   LEUKOCYTESUR MODERATE (A) 12/08/2023 2102   LEUKOCYTESUR 3+ 10/05/2014 2100    Radiological Exams on Admission: MR BRAIN W WO CONTRAST Result Date: 12/08/2023 CLINICAL DATA:  Neuro deficit, acute, stroke suspected. Slurred speech and left upper extremity weakness. History of multiple sclerosis. EXAM: MRI HEAD WITHOUT AND WITH CONTRAST TECHNIQUE: Multiplanar, multiecho pulse sequences of the brain and surrounding structures were obtained without and with intravenous contrast. CONTRAST:  7.65mL GADAVIST GADOBUTROL 1 MMOL/ML IV SOLN COMPARISON:  Head CT same day.  MRI 01/11/2023 FINDINGS: Brain: Diffusion imaging does not show any acute or subacute infarction or other cause of restricted diffusion. Small foci of involvement of the brainstem appears stable. Chronic involvement of the right thalamus is stable. Chronic foci of the cerebral hemispheric deep more than subcortical white matter are stable. No new or progressive lesions. No lesions show restricted diffusion or contrast enhancement. 6 mm meningioma at the left frontal convexity is unchanged. No intra-axial mass lesion. No hemorrhage, hydrocephalus or extra-axial fluid collection. Vascular: Major vessels at the base of the brain show flow. Skull and upper cervical spine: Negative Sinuses/Orbits: Clear/normal Other: None IMPRESSION: 1. No acute or reversible finding. Stable appearance of the brain since 01/11/2023. Chronic foci of involvement of the brainstem, right thalamus and cerebral hemispheric deep more than subcortical white matter consistent with the history of multiple sclerosis. No new or progressive lesions. No lesions show restricted diffusion or contrast enhancement. 2. 6 mm meningioma at the left frontal convexity,  unchanged. Electronically Signed   By: Paulina Fusi M.D.   On: 12/08/2023 20:48   CT HEAD WO CONTRAST Result Date: 12/08/2023 CLINICAL DATA:  Slurred speech. EXAM: CT HEAD WITHOUT CONTRAST TECHNIQUE: Contiguous axial images were obtained from the base of the skull through the vertex without intravenous contrast. RADIATION DOSE REDUCTION: This exam was performed according to the departmental dose-optimization program which includes automated exposure control, adjustment of the mA and/or kV according to patient size and/or use of iterative reconstruction technique. COMPARISON:  Head CT 01/11/2023. FINDINGS: Brain: No acute intracranial hemorrhage. Gray-white differentiation is preserved. No hydrocephalus or extra-axial collection. No mass effect or midline  shift. Vascular: No hyperdense vessel or unexpected calcification. Skull: No calvarial fracture or suspicious bone lesion. Skull base is unremarkable. Sinuses/Orbits: No acute finding. Other: None. IMPRESSION: No acute intracranial abnormality. Electronically Signed   By: Orvan Falconer M.D.   On: 12/08/2023 13:00     Data Reviewed: Relevant notes from primary care and specialist visits, past discharge summaries as available in EHR, including Care Everywhere. Prior diagnostic testing as pertinent to current admission diagnoses Updated medications and problem lists for reconciliation ED course, including vitals, labs, imaging, treatment and response to treatment Triage notes, nursing and pharmacy notes and ED provider's notes Notable results as noted in HPI   Assessment and Plan: * Acute focal neurological deficit Patient with new dysarthria, possible MS flare MRI negative for stroke Will get neurology consult and speech eval  Complicated UTI (urinary tract infection) Neurogenic bladder s/p ileal conduit History of frequent UTIs History of Pseudomonas UTI sensitive to cefepime August 2024 also has history of Klebsiella and Proteus Continue  cefepime based on past sensitivities Follow cultures  Multiple sclerosis, primary progressive (HCC) Paraplegia and left upper extremity weakness secondary to MS Patient otherwise at baseline Neurology consult for evaluation for flare given new dysarthria Decubitus precautions   Chronic diastolic CHF (congestive heart failure) (HCC) Clinically euvolemic     DVT prophylaxis: Lovenox  Consults: neurology, Dr Iver Nestle  Advance Care Planning:   Code Status: Prior   Family Communication: none  Disposition Plan: Back to previous home environment  Severity of Illness: The appropriate patient status for this patient is OBSERVATION. Observation status is judged to be reasonable and necessary in order to provide the required intensity of service to ensure the patient's safety. The patient's presenting symptoms, physical exam findings, and initial radiographic and laboratory data in the context of their medical condition is felt to place them at decreased risk for further clinical deterioration. Furthermore, it is anticipated that the patient will be medically stable for discharge from the hospital within 2 midnights of admission.   Author: Andris Baumann, MD 12/08/2023 11:15 PM  For on call review [email or web address].

## 2023-12-08 NOTE — ED Notes (Signed)
 Samantha Roys, MD at bedside

## 2023-12-08 NOTE — ED Triage Notes (Addendum)
 Pt comes with c/o slurred speech that started yesterday. Pt states she has also been having chills. Pt states she has MS. Pt states if she looks at something for long periods of time she gets blurry vision. Pt states some dizziness and more weakness in left arm.  Pt thinks it was about 10am when she went into living room and started talking that she noticed the  slurred speech and trouble getting her words out. Pt does have some continued slurred speech today and pausing in between speaking.

## 2023-12-08 NOTE — ED Notes (Signed)
 This RN asked pt if she had her blood drawn and pt states "no nobody has" Pt left triage area before phlebotomy arrival, lab called again and pt educated to stay in triage middle so lab can obtain blood draw.

## 2023-12-08 NOTE — ED Notes (Signed)
 Pt's family member left bedside at this time, plans to return later. Pt given warm blanket, lights dimmed for comfort, denies needs at this time. Awaiting results.

## 2023-12-08 NOTE — ED Notes (Signed)
 IV team at bedside

## 2023-12-08 NOTE — ED Notes (Signed)
 Lab called to ask status on labs that were drawn due to them not being in process due to delay in care, phlebotomy drew these labs.

## 2023-12-08 NOTE — ED Notes (Signed)
 MRI called, ready for pt @1930 . Pt moved from personal WC to stretcher via hoyer lift. Warm blankets provided. Pt to MRI at this time.  Family at bedside.

## 2023-12-08 NOTE — Hospital Course (Signed)
 Multiple sclerosis with paraplegia and neurogenic bladder s/p ileal conduit, diastolic CHF, neuropathic pain, depression and prior UTIs (Klebsiella, Pseudomonas, Proteus), who presents to the ED with a 1 day history of slurred speech and dysarthria with left upper extremity weakness.  She also has chills but no fever.  No history of strokes ED course and data review: Vitals within normal limits Labs with normal WBC but with urine consistent with UTI.  Other labs unremarkable EKG, personally viewed and interpreted showing sinus at 73 with no acute ST-T wave changes MRI brain showing stable findings since 12/2022 consistent with known multiple sclerosis and a 6 mm meningioma at the left frontal convexity unchanged Patient was treated with an IV fluid bolus given a dose of cefepime for UTI possibly causing recrudescence of MS symptoms versus MS flare, similar presentation to prior admissions for UTI. Hospitalist consulted for admission.Marland Kitchen

## 2023-12-08 NOTE — ED Notes (Signed)
Attempted IV access.  

## 2023-12-08 NOTE — ED Provider Notes (Signed)
 [facility] Provider Note    Event Date/Time   First MD Initiated Contact with Patient 12/08/23 1530     (approximate)   History   Aphasia and Chills   HPI  [name] is a 53 y.o. female history of primary progressive MS hemorrhoids wheelchair-bound presents to the ER for evaluation of slurred speech as well as left upper extremity weakness starting yesterday.  Denies any pain.  For that she has had some chills but no measured temperature.  No history of stroke.  Does not smoke.  Does have family history of CVA but she denies any history of hypertension hypercholesterolemia or diabetes.     Physical Exam   Triage Vital Signs: ED Triage Vitals  Encounter Vitals Group     BP 12/08/23 1216 129/76     Systolic BP Percentile --      Diastolic BP Percentile --      Pulse Rate 12/08/23 1216 70     Resp 12/08/23 1216 18     Temp 12/08/23 1216 98.4 F (36.9 C)     Temp src --      SpO2 12/08/23 1216 100 %     Weight --      Height --      Head Circumference --      Peak Flow --      Pain Score 12/08/23 1214 0     Pain Loc --      Pain Education --      Exclude from Growth Chart --     Most recent vital signs: Vitals:   12/08/23 1910 12/08/23 2051  BP:  129/67  Pulse:  77  Resp:  16  Temp: 98.1 F (36.7 C) (!) 97.5 F (36.4 C)  SpO2:  100%     Constitutional: Alert  Eyes: Conjunctivae are normal.  Head: Atraumatic. Nose: No congestion/rhinnorhea. Mouth/Throat: Mucous membranes are moist.   Neck: Painless ROM.  Cardiovascular:   Good peripheral circulation. Respiratory: Normal respiratory effort.  No retractions.  Gastrointestinal: Soft and nontender.  Musculoskeletal:  no deformity Neurologic: Dysarthric speech.  No receptive aphasia.  Some chronic appearing spasticity of the left upper extremity with decreased strength as compared to baseline per patient. Skin:  Skin is warm, dry and intact. No rash noted. Psychiatric: Mood  and affect are normal. Speech and behavior are normal.    ED Results / Procedures / Treatments   Labs (all labs ordered are listed, but only abnormal results are displayed) Labs Reviewed  CBC - Abnormal; Notable for the following components:      Result Value   RBC 6.06 (*)    Hemoglobin 17.4 (*)    HCT 52.4 (*)    All other components within normal limits  COMPREHENSIVE METABOLIC PANEL - Abnormal; Notable for the following components:   Calcium 10.4 (*)    Total Protein 10.3 (*)    Albumin 5.5 (*)    All other components within normal limits  URINALYSIS, ROUTINE W REFLEX MICROSCOPIC - Abnormal; Notable for the following components:   Color, Urine YELLOW (*)    APPearance TURBID (*)    Hgb urine dipstick SMALL (*)    Ketones, ur 5 (*)    Protein, ur 100 (*)    Nitrite POSITIVE (*)    Leukocytes,Ua MODERATE (*)    Bacteria, UA FEW (*)    All other components within normal limits  URINE CULTURE  PROTIME-INR  APTT  DIFFERENTIAL  ETHANOL  EKG  ED ECG REPORT I, Willy Eddy, the attending physician, personally viewed and interpreted this ECG.   Date: 12/08/2023  EKG Time: 12:16  Rate: 70  Rhythm: sinus  Axis: normal  Intervals: normal  ST&T Change: no stemi,    RADIOLOGY Please see ED Course for my review and interpretation.  I personally reviewed all radiographic images ordered to evaluate for the above acute complaints and reviewed radiology reports and findings.  These findings were personally discussed with the patient.  Please see medical record for radiology report.    PROCEDURES:  Critical Care performed: No  Procedures   MEDICATIONS ORDERED IN ED: Medications  sodium chloride flush (NS) 0.9 % injection 3 mL ( Intravenous Canceled Entry 12/08/23 1325)  ceFEPIme (MAXIPIME) 2 g in sodium chloride 0.9 % 100 mL IVPB (has no administration in time range)  sodium chloride 0.9 % bolus 500 mL (0 mLs Intravenous Stopped 12/08/23 1940)  gadobutrol  (GADAVIST) 1 MMOL/ML injection 7.5 mL (7.5 mLs Intravenous Contrast Given 12/08/23 2018)     IMPRESSION / MDM / ASSESSMENT AND PLAN / ED COURSE  I reviewed the triage vital signs and the nursing notes.                              Differential diagnosis includes, but is not limited to, cva, tia, hypoglycemia, dehydration, electrolyte abnormality, MS flare  Patient presenting to the ER for evaluation of symptoms as described above.  Based on symptoms, risk factors and considered above differential, this presenting complaint could reflect a potentially life-threatening illness therefore the patient will be placed on continuous pulse oximetry and telemetry for monitoring.  Laboratory evaluation will be sent to evaluate for the above complaints.  Patient with findings that are concerning for the above differential and given her history of MS certainly concerning for MS flare.  CT head on my review and interpretation without evidence of acute IPH or mass effect.  I do believe that she clinically warrants MRI to further evaluate.  Does not meet criteria for code stroke.    Clinical Course as of 12/08/23 2212  Fri Dec 08, 2023  2101 Discussed results of MRI with patient.  Patient states that she is having some dysuria now and would like to have her urine checked. [PR]  2151 Urine is consistent with UTI.  Given patient's weakness and likely recrudescence of previous MS neurosymptoms in the setting of her UTI will discuss case in consultation with hospitalist as he does not feel like she is able to care care of herself at home currently.  On review of chart she has had similar admissions in the past and responded well to antibiotics and subsequent discharge after admission. [PR]    Clinical Course User Index [PR] Willy Eddy, MD     FINAL CLINICAL IMPRESSION(S) / ED DIAGNOSES   Final diagnoses:  Complicated UTI (urinary tract infection)     Rx / DC Orders   ED Discharge Orders      None        Note:  This document was prepared using Dragon voice recognition software and may include unintentional dictation errors.    Willy Eddy, MD 12/08/23 2212

## 2023-12-09 DIAGNOSIS — R29818 Other symptoms and signs involving the nervous system: Secondary | ICD-10-CM | POA: Diagnosis not present

## 2023-12-09 MED ORDER — DULOXETINE HCL 30 MG PO CPEP
60.0000 mg | ORAL_CAPSULE | Freq: Every day | ORAL | Status: DC
Start: 2023-12-09 — End: 2023-12-09
  Administered 2023-12-09: 60 mg via ORAL
  Filled 2023-12-09: qty 2

## 2023-12-09 MED ORDER — INFLUENZA VIRUS VACC SPLIT PF (FLUZONE) 0.5 ML IM SUSY: 0.5000 mL | PREFILLED_SYRINGE | INTRAMUSCULAR | Status: DC

## 2023-12-09 MED ORDER — SENNA 8.6 MG PO TABS
2.0000 | ORAL_TABLET | Freq: Two times a day (BID) | ORAL | Status: DC
  Administered 2023-12-09: 17.2 mg via ORAL
  Filled 2023-12-09: qty 2

## 2023-12-09 MED ORDER — DEXTROSE-SODIUM CHLORIDE 5-0.9 % IV SOLN: INTRAVENOUS | Status: DC

## 2023-12-09 MED ORDER — SODIUM CHLORIDE 0.9 % IV SOLN
2.0000 g | Freq: Two times a day (BID) | INTRAVENOUS | Status: DC
  Administered 2023-12-09: 2 g via INTRAVENOUS
  Filled 2023-12-09: qty 12.5

## 2023-12-09 MED ORDER — ALBUTEROL SULFATE (2.5 MG/3ML) 0.083% IN NEBU
2.5000 mg | INHALATION_SOLUTION | Freq: Four times a day (QID) | RESPIRATORY_TRACT | Status: DC | PRN
Start: 2023-12-09 — End: 2023-12-09

## 2023-12-09 MED ORDER — GABAPENTIN 100 MG PO CAPS
200.0000 mg | ORAL_CAPSULE | Freq: Every morning | ORAL | Status: DC
  Administered 2023-12-09: 200 mg via ORAL
  Filled 2023-12-09: qty 2

## 2023-12-09 MED ORDER — ONDANSETRON HCL 4 MG/2ML IJ SOLN: 4.0000 mg | Freq: Four times a day (QID) | INTRAMUSCULAR | Status: DC | PRN

## 2023-12-09 MED ORDER — ENOXAPARIN SODIUM 40 MG/0.4ML IJ SOSY
40.0000 mg | PREFILLED_SYRINGE | INTRAMUSCULAR | Status: DC
  Administered 2023-12-09: 40 mg via SUBCUTANEOUS
  Filled 2023-12-09: qty 0.4

## 2023-12-09 MED ORDER — ACETAMINOPHEN 650 MG RE SUPP: 650.0000 mg | Freq: Four times a day (QID) | RECTAL | Status: DC | PRN

## 2023-12-09 MED ORDER — AMANTADINE HCL 100 MG PO CAPS
200.0000 mg | ORAL_CAPSULE | Freq: Two times a day (BID) | ORAL | Status: DC
  Administered 2023-12-09: 200 mg via ORAL
  Filled 2023-12-09: qty 2

## 2023-12-09 MED ORDER — BACLOFEN 10 MG PO TABS
20.0000 mg | ORAL_TABLET | Freq: Four times a day (QID) | ORAL | Status: DC
  Administered 2023-12-09 (×2): 20 mg via ORAL
  Filled 2023-12-09 (×2): qty 2

## 2023-12-09 MED ORDER — ONDANSETRON HCL 4 MG PO TABS: 4.0000 mg | ORAL_TABLET | Freq: Four times a day (QID) | ORAL | Status: DC | PRN

## 2023-12-09 MED ORDER — ACETAMINOPHEN 325 MG PO TABS
650.0000 mg | ORAL_TABLET | Freq: Four times a day (QID) | ORAL | Status: DC | PRN
  Administered 2023-12-09: 650 mg via ORAL

## 2023-12-09 MED ORDER — ACETAMINOPHEN 325 MG PO TABS
650.0000 mg | ORAL_TABLET | Freq: Four times a day (QID) | ORAL | Status: DC | PRN
  Administered 2023-12-09: 650 mg via ORAL
  Filled 2023-12-09 (×2): qty 2

## 2023-12-09 MED ORDER — PANTOPRAZOLE SODIUM 40 MG PO TBEC
40.0000 mg | DELAYED_RELEASE_TABLET | Freq: Two times a day (BID) | ORAL | Status: DC
  Administered 2023-12-09: 40 mg via ORAL
  Filled 2023-12-09: qty 1

## 2023-12-09 NOTE — Plan of Care (Signed)
 Discussed with Dr. Ashok Pall, patient presented with slurred speech in the setting of UTI which has resolved with treatment of her UTI with a negative MRI brain.  Not felt to be a primarily neurological issue --he will reach out if any additional neurological concerns arise

## 2023-12-09 NOTE — Assessment & Plan Note (Deleted)
 Paraplegia and left upper extremity weakness secondary to MS Patient otherwise at baseline Neurology consult for evaluation for flare given new dysarthria

## 2023-12-09 NOTE — Assessment & Plan Note (Signed)
 Patient with new dysarthria, possible MS flare MRI negative for stroke Will get neurology consult and speech eval

## 2023-12-09 NOTE — Plan of Care (Signed)
  Problem: Education: Goal: Knowledge of General Education information will improve Description: Including pain rating scale, medication(s)/side effects and non-pharmacologic comfort measures Outcome: Progressing   Problem: Health Behavior/Discharge Planning: Goal: Ability to manage health-related needs will improve Outcome: Not Progressing   Problem: Clinical Measurements: Goal: Ability to maintain clinical measurements within normal limits will improve Outcome: Progressing Goal: Diagnostic test results will improve Outcome: Progressing Goal: Respiratory complications will improve Outcome: Progressing Goal: Cardiovascular complication will be avoided Outcome: Progressing   Problem: Activity: Goal: Risk for activity intolerance will decrease Outcome: Not Progressing   Problem: Nutrition: Goal: Adequate nutrition will be maintained Outcome: Progressing   Problem: Coping: Goal: Level of anxiety will decrease Outcome: Progressing   Problem: Pain Managment: Goal: General experience of comfort will improve and/or be controlled Outcome: Progressing   Problem: Safety: Goal: Ability to remain free from injury will improve Outcome: Progressing   Problem: Skin Integrity: Goal: Risk for impaired skin integrity will decrease Outcome: Progressing

## 2023-12-09 NOTE — Assessment & Plan Note (Addendum)
 Paraplegia and left upper extremity weakness secondary to MS Patient otherwise at baseline Neurology consult for evaluation for flare given new dysarthria Decubitus precautions

## 2023-12-09 NOTE — Progress Notes (Signed)
 Pharmacy Antibiotic Note  [name] is a 53 y.o. female admitted on 12/08/2023 with UTI without sepsis. PMH includes MS with paraplegia and neurogenic bladder s/p ileal conduit, diastolic CHF, neuropathic pain, depression and prior UTIs (Klebsiella, Pseudomonas, Proteus).  Pharmacy has been consulted for cefepime dosing.  Plan: Continue cefepime 2gm IV q 12hours Follow up urine culture results and renal function to adjust therapy as needed.  Height: 5\' 4"  (162.6 cm) Weight: 80.7 kg (178 lb) IBW/kg (Calculated) : 54.7  Temp (24hrs), Avg:98.3 F (36.8 C), Min:97.5 F (36.4 C), Max:98.9 F (37.2 C)  Recent Labs  Lab 12/08/23 1510  WBC 7.5  CREATININE 0.61    Estimated Creatinine Clearance: 82.6 mL/min (by C-G formula based on SCr of 0.61 mg/dL).    Allergies  Allergen Reactions   Latex Swelling and Other (See Comments)    Reaction:  Facial swelling     Antimicrobials this admission:  3/15 Cefepime >> >>   Dose adjustments this admission: n/a   Microbiology results: 3/14 UCx:  in process   Ceira Hoeschen Rodriguez-Guzman PharmD, BCPS 12/09/2023 8:20 AM

## 2023-12-09 NOTE — Assessment & Plan Note (Addendum)
 Neurogenic bladder s/p ileal conduit History of frequent UTIs History of Pseudomonas UTI sensitive to cefepime August 2024 also has history of Klebsiella and Proteus Continue cefepime based on past sensitivities Follow cultures

## 2023-12-09 NOTE — Progress Notes (Signed)
 Pharmacy Antibiotic Note  [name] is a 53 y.o. female admitted on 12/08/2023 with UTI.  Pharmacy has been consulted for cefepime dosing.  Plan: Cefepime 2 gm IV X 1 given in ED on 3/14 @ 2226. Cefepime 2 gm IV Q12H ordered to start on 3/15 @ 1000.   Height: 5\' 4"  (162.6 cm) Weight: 80.7 kg (178 lb) IBW/kg (Calculated) : 54.7  Temp (24hrs), Avg:98 F (36.7 C), Min:97.5 F (36.4 C), Max:98.4 F (36.9 C)  Recent Labs  Lab 12/08/23 1510  WBC 7.5  CREATININE 0.61    Estimated Creatinine Clearance: 82.6 mL/min (by C-G formula based on SCr of 0.61 mg/dL).    Allergies  Allergen Reactions   Latex Swelling and Other (See Comments)    Reaction:  Facial swelling     Antimicrobials this admission:   >>    >>   Dose adjustments this admission:   Microbiology results:  BCx:   UCx:    Sputum:    MRSA PCR:   Thank you for allowing pharmacy to be a part of this patient's care.  Nafis Farnan D 12/09/2023 2:01 AM

## 2023-12-09 NOTE — ED Notes (Signed)
 Repositioned pt with pillows. Gave pt a cup of water.,

## 2023-12-09 NOTE — Discharge Summary (Signed)
 [name]:[id number] DOB: [birth year] DOA: 12/08/2023  PCP: [name], MD  Admit date: 12/08/2023 Discharge date: 12/09/2023  Time spent: 35 minutes  Recommendations for Outpatient Follow-up:  Pcp and neurology f/u     Discharge Diagnoses:  Principal Problem:   Acute focal neurological deficit Active Problems:   Neurogenic bladder   History of ileal conduit   Multiple sclerosis, primary progressive (HCC)   Paraplegia (HCC)   Chronic diastolic CHF (congestive heart failure) (HCC)   Obesity (BMI 30-39.9)   Discharge Condition: stable  Diet recommendation: heart healthy  Filed Weights   12/09/23 0157  Weight: 80.7 kg    History of present illness:  From admission h and p [name] is a 53 y.o. female with medical history significant for Multiple sclerosis with paraplegia and neurogenic bladder s/p ileal conduit, diastolic CHF, neuropathic pain, depression and prior UTIs (Klebsiella, Pseudomonas, Proteus), who presents to the ED with a 1 day history of slurred speech and dysarthria .  She also has chills but no fever.  States she has presented similarly in the past when she had a UTI.  Has chronic left upper extremity weakness with no recent worsening.  Otherwise at baseline except for new dysarthria   Hospital Course:  Patient presents with transient dysarthria. She reports no new focal neurologic symptoms. MRI of brain showed no stroke, signs of ms flare, or other acute findings. Urinalysis suggestive of bacteriuria (has ileal conduit) but patient denies pain in the region of the conduit, denies fever, has no leukocytosis. Was started on cefepime for UTI but this appears to be asymptomatic bacteriuria, does not warrant treatment. Discussed case with Dr. Iver Nestle of neurology, given MRI findings and resolution of symptoms, no additional workup indicated.  Procedures: none   Consultations: Reviewed with neurology  Discharge Exam: Vitals:   12/09/23 0500  12/09/23 0754  BP: 116/66 138/76  Pulse: 79 86  Resp: 18   Temp: 98.9 F (37.2 C) 98.6 F (37 C)  SpO2: 100% 97%    General: NAD Cardiovascular: RRR Respiratory: CTAB Neuro: paraplegic, left arm weakness  Discharge Instructions   Discharge Instructions     Diet general   Complete by: As directed    Increase activity slowly   Complete by: As directed       Allergies as of 12/09/2023       Reactions   Latex Swelling, Other (See Comments)   Reaction:  Facial swelling         Medication List     STOP taking these medications    benzonatate 200 MG capsule Commonly known as: TESSALON   gentamicin cream 0.1 % Commonly known as: GARAMYCIN   lactulose 10 GM/15ML solution Commonly known as: CHRONULAC       TAKE these medications    acetaminophen 500 MG tablet Commonly known as: TYLENOL Take 500 mg by mouth every 6 (six) hours as needed.   albuterol 108 (90 Base) MCG/ACT inhaler Commonly known as: VENTOLIN HFA Inhale 2 puffs into the lungs every 6 (six) hours as needed for wheezing or shortness of breath.   amantadine 100 MG capsule Commonly known as: SYMMETREL Take 200 mg by mouth 2 (two) times daily.   Aquacel Ag Foam 6"X6" Pads Apply 1 each topically daily in the afternoon.   baclofen 20 MG tablet Commonly known as: LIORESAL Take 20 mg by mouth 4 (four) times daily.   cholecalciferol 1000 units tablet Commonly known as: VITAMIN D Take 1,000 Units  by mouth daily.   Cranberry 500 MG Chew Chew 500 mg by mouth daily.   DULoxetine 60 MG capsule Commonly known as: CYMBALTA Take 60 mg by mouth daily.   Foam Dressing Circular Border Pads Apply 1 each topically every 3 (three) days.   gabapentin 100 MG capsule Commonly known as: NEURONTIN Take 200 mg by mouth every morning.   gabapentin 800 MG tablet Commonly known as: NEURONTIN Take 800 mg by mouth at bedtime.   guaiFENesin 600 MG 12 hr tablet Commonly known as: MUCINEX Take 2 tablets  (1,200 mg total) by mouth 2 (two) times daily.   Ozempic (1 MG/DOSE) 4 MG/3ML Sopn Generic drug: Semaglutide (1 MG/DOSE) Inject 1 mg into the skin once a week. On Monday   pantoprazole 40 MG tablet Commonly known as: PROTONIX Take 1 tablet (40 mg total) by mouth 2 (two) times daily.   pantoprazole 40 MG tablet Commonly known as: PROTONIX Take 1 tablet by mouth daily.   senna 8.6 MG tablet Commonly known as: SENOKOT Take 2 tablets by mouth 2 (two) times daily.   Stimulant Laxative 8.6-50 MG tablet Generic drug: senna-docusate Take 1 tablet by mouth at bedtime.       Allergies  Allergen Reactions   Latex Swelling and Other (See Comments)    Reaction:  Facial swelling     Follow-up Information     [name], MD Follow up.   Specialty: Internal Medicine Contact information: [address] [place] [address] [phone]         Your neurologist Follow up.                   The results of significant diagnostics from this hospitalization (including imaging, microbiology, ancillary and laboratory) are listed below for reference.    Significant Diagnostic Studies: MR BRAIN W WO CONTRAST Result Date: 12/08/2023 CLINICAL DATA:  Neuro deficit, acute, stroke suspected. Slurred speech and left upper extremity weakness. History of multiple sclerosis. EXAM: MRI HEAD WITHOUT AND WITH CONTRAST TECHNIQUE: Multiplanar, multiecho pulse sequences of the brain and surrounding structures were obtained without and with intravenous contrast. CONTRAST:  7.39mL GADAVIST GADOBUTROL 1 MMOL/ML IV SOLN COMPARISON:  Head CT same day.  MRI 01/11/2023 FINDINGS: Brain: Diffusion imaging does not show any acute or subacute infarction or other cause of restricted diffusion. Small foci of involvement of the brainstem appears stable. Chronic involvement of the right thalamus is stable. Chronic foci of the cerebral hemispheric deep more than subcortical white matter are stable. No  new or progressive lesions. No lesions show restricted diffusion or contrast enhancement. 6 mm meningioma at the left frontal convexity is unchanged. No intra-axial mass lesion. No hemorrhage, hydrocephalus or extra-axial fluid collection. Vascular: Major vessels at the base of the brain show flow. Skull and upper cervical spine: Negative Sinuses/Orbits: Clear/normal Other: None IMPRESSION: 1. No acute or reversible finding. Stable appearance of the brain since 01/11/2023. Chronic foci of involvement of the brainstem, right thalamus and cerebral hemispheric deep more than subcortical white matter consistent with the history of multiple sclerosis. No new or progressive lesions. No lesions show restricted diffusion or contrast enhancement. 2. 6 mm meningioma at the left frontal convexity, unchanged. Electronically Signed   By: Paulina Fusi M.D.   On: 12/08/2023 20:48   CT HEAD WO CONTRAST Result Date: 12/08/2023 CLINICAL DATA:  Slurred speech. EXAM: CT HEAD WITHOUT CONTRAST TECHNIQUE: Contiguous axial images were obtained from the base of the skull through the vertex without  intravenous contrast. RADIATION DOSE REDUCTION: This exam was performed according to the departmental dose-optimization program which includes automated exposure control, adjustment of the mA and/or kV according to patient size and/or use of iterative reconstruction technique. COMPARISON:  Head CT 01/11/2023. FINDINGS: Brain: No acute intracranial hemorrhage. Gray-white differentiation is preserved. No hydrocephalus or extra-axial collection. No mass effect or midline shift. Vascular: No hyperdense vessel or unexpected calcification. Skull: No calvarial fracture or suspicious bone lesion. Skull base is unremarkable. Sinuses/Orbits: No acute finding. Other: None. IMPRESSION: No acute intracranial abnormality. Electronically Signed   By: Orvan Falconer M.D.   On: 12/08/2023 13:00    Microbiology: No results found for this or any previous  visit (from the past 240 hours).   Labs: Basic Metabolic Panel: Recent Labs  Lab 12/08/23 1510  NA 137  K 4.3  CL 105  CO2 24  GLUCOSE 92  BUN 12  CREATININE 0.61  CALCIUM 10.4*   Liver Function Tests: Recent Labs  Lab 12/08/23 1510  AST 16  ALT 18  ALKPHOS 65  BILITOT 1.1  PROT 10.3*  ALBUMIN 5.5*   No results for input(s): "LIPASE", "AMYLASE" in the last 168 hours. No results for input(s): "AMMONIA" in the last 168 hours. CBC: Recent Labs  Lab 12/08/23 1510  WBC 7.5  NEUTROABS 3.6  HGB 17.4*  HCT 52.4*  MCV 86.5  PLT 366   Cardiac Enzymes: No results for input(s): "CKTOTAL", "CKMB", "CKMBINDEX", "TROPONINI" in the last 168 hours. BNP: BNP (last 3 results) Recent Labs    01/11/23 1351  BNP 97.6    ProBNP (last 3 results) No results for input(s): "PROBNP" in the last 8760 hours.  CBG: No results for input(s): "GLUCAP" in the last 168 hours.     Signed:  Silvano Bilis MD.  [facility] 12/09/2023, 10:13 AM

## 2023-12-09 NOTE — Evaluation (Signed)
 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 female with medical history significant for Multiple sclerosis with paraplegia and neurogenic bladder s/p ileal conduit, diastolic CHF, neuropathic pain, depression and prior UTIs (Klebsiella, Pseudomonas, Proteus), who presents to the ED with a 1 day history of slurred speech and dysarthria .  She also has chills but no fever.  States she has presented similarly in the past when she had a UTI.  Has chronic left upper extremity weakness with no recent worsening.  Otherwise at baseline except for new dysarthria." Pt is currently NPO and on room air and thin liquids.   MRI 12/08/23: No acute or reversible finding. Stable appearance of the brain since 01/11/2023. Chronic foci of involvement of the brainstem, right thalamus and cerebral hemispheric deep more than subcortical white matter consistent with the history of multiple sclerosis. No new or progressive lesions. No lesions show restricted diffusion or contrast enhancement. 6 mm meningioma at the left frontal convexity, unchanged.   Assessment / Plan / Recommendation   Clinical Impression  Pt seen for clinical swallow assessment and motor speech evaluation in the setting of acute onset of dysarthria with baseline MS. Last swallow assessment on file completed in 2023 with recommendations for regular solids. Pt reports difficulty swallowing related to feeling of drainage vs globus sensation. Reportedly EGD completed at OSH revealing a "normal esophagus." If drainage sensation persists, pt likely to benefit from ENT consult.  Regarding clinical swallow assessment, pt presents with clinically intact oropharyngeal swallow function. Pt seen with trials of thin liquids, puree solids, and regular solids. With allowance of extended time, pt able to self feed with set up provided. No overt or subtle s/sx pharyngeal dysphagia noted. No change to vocal quality across trials. Vitals stable for duration of trials. Oral phase was mildly prolonged for mastication and clearance. Compensatory strategy training completed for alternating solids and liquids to aid oral clearance and reduce sensation of residual in the throat. Pt reported understanding. Pt with self-awareness regarding tendency for large bites with when eating and need to maintain aspiration precautions (specifically small bites).   Based on existing MS, pt is at increased risk for aspiration, recommend aspiration precautions (slow rate, small bites, elevated HOB, and alert for PO intake). Regular solids (with cut meats to aid mastication and clearance) and thin liquids allowed.   Regarding motor speech assessment, pt presents with mild dysarthria, c/b imprecise articulation, irregular rate, and inconsistent volume. Increased challenge noted with motor coordination for  diadochokinetic intervals and challenges for increased rate of speech. Pt noted to pause prior to re-trial in the setting of communication breakdown. Nasality grossly and oral motor movements intact and  speech was intelligible at conversational level.  Perceptually, pt reports that speech is more effortful and frustrating. Introduction provided for compensatory strategies to aid motor speech. Chart review revealing order placed for SLP services in December 2024, but pt reports not seeing SLP recently. Recommend follow up SLP services for intervention for dysarthria and monitoring for dysphagia.    SLP Visit Diagnosis: Dysarthria and anarthria (R47.1);Dysphagia, unspecified (R13.10)    Aspiration Risk  Moderate aspiration risk    Diet Recommendation   Age appropriate regular;Thin (cut meats)  Medication Administration: Whole meds with liquid    Other  Recommendations Recommended Consults: Consider ENT evaluation (if "drainage" mucus presents in back of throat) Oral Care Recommendations: Oral care BID    Recommendations for follow up therapy are one component of a multi-disciplinary discharge planning process, led by the attending physician.  Recommendations may be updated based on patient status, additional functional criteria and insurance authorization.  Follow up Recommendations Follow physician's recommendations for discharge plan and follow up therapies      Assistance Recommended at Discharge    Functional Status Assessment Patient has not had a recent decline in their functional status (in regards to oropharyngeal swallow function- recommend follow up for dysarthria and monitoring for dysphagia symptoms)    Swallow Study   General Date of Onset: 12/09/23 HPI: Per H&P "[name] is a 53 y.o. female with medical history significant for Multiple sclerosis with paraplegia and neurogenic bladder s/p ileal conduit, diastolic CHF, neuropathic pain, depression and prior UTIs (Klebsiella, Pseudomonas, Proteus), who presents to the ED with a 1 day history of slurred speech and dysarthria .  She also has chills but no fever.  States she has presented similarly in the past when she had a UTI.  Has chronic left upper extremity weakness  with no recent worsening.  Otherwise at baseline except for new dysarthria." Pt is currently NPO and on room air and thin liquids.   Type of Study: Bedside Swallow Evaluation Previous Swallow Assessment: March 2023- recommending regular solids and thin liquids Diet Prior to this Study: NPO Temperature Spikes Noted: No Respiratory Status: Room air History of Recent Intubation: No Behavior/Cognition: Alert;Cooperative Oral Cavity Assessment: Within Functional Limits Oral Care Completed by SLP: Recent completion by staff Oral Cavity - Dentition: Adequate natural dentition Vision: Functional for self-feeding Self-Feeding Abilities: Able to feed self (with extended time- baseline uses built up utensils) Patient Positioning: Upright in bed Baseline Vocal Quality: Normal Volitional Cough: Weak (mildly reduced) Volitional Swallow: Able to elicit    Oral/Motor/Sensory Function Overall Oral Motor/Sensory Function: Within functional limits   Ice Chips Ice chips: Not tested   Thin Liquid Thin Liquid: Within functional limits Presentation: Straw    Nectar Thick Nectar Thick Liquid: Not tested   Honey Thick Honey Thick Liquid: Not tested   Puree Puree: Within functional limits   Solid     Solid: Within functional limits     [place] Lior Cartelli Clapp, MS, CCC-SLP Speech Language Pathologist Re[facility] Services; [facility] [phone] ([email or web address])   [place] J Clapp 12/09/2023,10:31 AM

## 2023-12-09 NOTE — Plan of Care (Signed)

## 2023-12-09 NOTE — Assessment & Plan Note (Signed)
Clinically euvolemic 

## 2023-12-14 LAB — URINE CULTURE: Culture: >=100000 — AB

## 2024-01-08 ENCOUNTER — Ambulatory Visit: Admit: 2024-01-08 | Discharge: 2024-01-09 | Payer: Medicare (Managed Care)

## 2024-01-08 DIAGNOSIS — L89323 Pressure ulcer of left buttock, stage 3: Principal | ICD-10-CM

## 2024-01-08 DIAGNOSIS — G35 Multiple sclerosis: Principal | ICD-10-CM

## 2024-01-08 DIAGNOSIS — Z8619 Personal history of other infectious and parasitic diseases: Principal | ICD-10-CM

## 2024-01-08 DIAGNOSIS — N390 Urinary tract infection, site not specified: Principal | ICD-10-CM

## 2024-01-08 DIAGNOSIS — G8220 Paraplegia, unspecified: Principal | ICD-10-CM

## 2024-01-08 DIAGNOSIS — R8271 Bacteriuria: Principal | ICD-10-CM

## 2024-01-22 ENCOUNTER — Ambulatory Visit
Admission: TF | Admit: 2024-01-22 | Discharge: 2024-01-25 | Disposition: A | Discharge status: Home / Self Care | Payer: Medicare (Managed Care) | Source: Intra-hospital | Admitting: Student in an Organized Health Care Education/Training Program

## 2024-01-22 ENCOUNTER — Inpatient Hospital Stay
Admission: TF | Admit: 2024-01-22 | Discharge: 2024-01-25 | Disposition: A | Discharge status: Home / Self Care | Payer: Medicare (Managed Care) | Source: Intra-hospital | Admitting: Student in an Organized Health Care Education/Training Program

## 2024-01-22 ENCOUNTER — Encounter
Admission: TF | Admit: 2024-01-22 | Discharge: 2024-01-25 | Disposition: A | Discharge status: Home / Self Care | Payer: Medicare (Managed Care) | Source: Intra-hospital | Admitting: Student in an Organized Health Care Education/Training Program

## 2024-01-22 ENCOUNTER — Inpatient Hospital Stay: Admit: 2024-01-22 | Discharge: 2024-01-23 | Payer: Medicare (Managed Care)

## 2024-01-22 DIAGNOSIS — Z1231 Encounter for screening mammogram for malignant neoplasm of breast: Principal | ICD-10-CM

## 2024-01-25 MED ORDER — DULOXETINE 60 MG CAPSULE,DELAYED RELEASE
ORAL_CAPSULE | Freq: Every day | ORAL | 0 refills | 30.00000 days | Status: CP
Start: 2024-01-25 — End: 2024-02-24

## 2024-01-25 MED ORDER — LACTULOSE 20 GRAM/30 ML ORAL SOLUTION
Freq: Every day | ORAL | 0 refills | 30.00000 days | Status: CP | PRN
Start: 2024-01-25 — End: 2024-02-24

## 2024-01-26 MED ORDER — LEVOFLOXACIN 500 MG TABLET
ORAL_TABLET | Freq: Every day | ORAL | 0 refills | 2.00000 days | Status: CP
Start: 2024-01-26 — End: ?

## 2024-01-29 DIAGNOSIS — Z09 Encounter for follow-up examination after completed treatment for conditions other than malignant neoplasm: Principal | ICD-10-CM

## 2024-02-29 ENCOUNTER — Emergency Department
Admit: 2024-02-29 | Discharge: 2024-03-01 | Disposition: A | Discharge status: Home / Self Care | Payer: Medicare (Managed Care)

## 2024-02-29 ENCOUNTER — Ambulatory Visit: Admit: 2024-02-29 | Discharge: 2024-03-01 | Payer: Medicare (Managed Care)

## 2024-02-29 DIAGNOSIS — Z09 Encounter for follow-up examination after completed treatment for conditions other than malignant neoplasm: Principal | ICD-10-CM

## 2024-02-29 MED ORDER — POLYETHYLENE GLYCOL 3350 17 GRAM ORAL POWDER PACKET
PACK | Freq: Every day | ORAL | 3 refills | 90.00000 days | Status: CP
Start: 2024-02-29 — End: ?

## 2024-02-29 MED ORDER — LACTULOSE 20 GRAM/30 ML ORAL SOLUTION
Freq: Every day | ORAL | 0 refills | 30.00000 days | Status: CN | PRN
Start: 2024-02-29 — End: 2024-03-30

## 2024-03-06 MED ORDER — DULOXETINE 60 MG CAPSULE,DELAYED RELEASE
ORAL_CAPSULE | Freq: Every day | ORAL | 0 refills | 30.00000 days | Status: CP
Start: 2024-03-06 — End: 2024-04-05

## 2024-03-18 ENCOUNTER — Emergency Department
Admission: EM | Admit: 2024-03-18 | Discharge: 2024-03-18 | Disposition: A | Discharge status: Home / Self Care | Attending: Emergency Medicine | Admitting: Emergency Medicine

## 2024-03-18 ENCOUNTER — Emergency Department

## 2024-03-18 ENCOUNTER — Encounter: Payer: Self-pay | Admitting: Emergency Medicine

## 2024-03-18 DIAGNOSIS — G35 Multiple sclerosis: Secondary | ICD-10-CM | POA: Diagnosis not present

## 2024-03-18 DIAGNOSIS — I5030 Unspecified diastolic (congestive) heart failure: Secondary | ICD-10-CM | POA: Insufficient documentation

## 2024-03-18 DIAGNOSIS — R109 Unspecified abdominal pain: Secondary | ICD-10-CM | POA: Diagnosis present

## 2024-03-18 DIAGNOSIS — D72829 Elevated white blood cell count, unspecified: Secondary | ICD-10-CM | POA: Insufficient documentation

## 2024-03-18 DIAGNOSIS — K5900 Constipation, unspecified: Secondary | ICD-10-CM | POA: Insufficient documentation

## 2024-03-18 DIAGNOSIS — M545 Low back pain, unspecified: Secondary | ICD-10-CM | POA: Diagnosis present

## 2024-03-18 LAB — COMPREHENSIVE METABOLIC PANEL WITH GFR
ALT: 18 U/L (ref 0–44)
AST: 14 U/L — ABNORMAL LOW (ref 15–41)
Albumin: 4.8 g/dL (ref 3.5–5.0)
Alkaline Phosphatase: 55 U/L (ref 38–126)
Anion gap: 15 (ref 5–15)
BUN: 14 mg/dL (ref 6–20)
CO2: 21 mmol/L — ABNORMAL LOW (ref 22–32)
Calcium: 9.9 mg/dL (ref 8.9–10.3)
Chloride: 100 mmol/L (ref 98–111)
Creatinine, Ser: 0.46 mg/dL (ref 0.44–1.00)
GFR, Estimated: >60 mL/min (ref >60)
Glucose, Bld: 86 mg/dL (ref 70–99)
Potassium: 3.9 mmol/L (ref 3.5–5.1)
Sodium: 136 mmol/L (ref 135–145)
Total Bilirubin: 2.1 mg/dL — ABNORMAL HIGH (ref 0.0–1.2)
Total Protein: 9.4 g/dL — ABNORMAL HIGH (ref 6.5–8.1)

## 2024-03-18 LAB — CBC WITH DIFFERENTIAL/PLATELET
Abs Immature Granulocytes: 0.03 10*3/uL (ref 0.00–0.07)
Basophils Absolute: 0.1 10*3/uL (ref 0.0–0.1)
Basophils Relative: 0 %
Eosinophils Absolute: 0.1 10*3/uL (ref 0.0–0.5)
Eosinophils Relative: 0 %
HCT: 46 % (ref 36.0–46.0)
Hemoglobin: 15.2 g/dL — ABNORMAL HIGH (ref 12.0–15.0)
Immature Granulocytes: 0 %
Lymphocytes Relative: 9 %
Lymphs Abs: 1.3 10*3/uL (ref 0.7–4.0)
MCH: 29.9 pg (ref 26.0–34.0)
MCHC: 33 g/dL (ref 30.0–36.0)
MCV: 90.4 fL (ref 80.0–100.0)
Monocytes Absolute: 1.1 10*3/uL — ABNORMAL HIGH (ref 0.1–1.0)
Monocytes Relative: 8 %
Neutro Abs: 11.9 10*3/uL — ABNORMAL HIGH (ref 1.7–7.7)
Neutrophils Relative %: 83 %
Platelets: 264 10*3/uL (ref 150–400)
RBC: 5.09 MIL/uL (ref 3.87–5.11)
RDW: 13.7 % (ref 11.5–15.5)
WBC: 14.4 10*3/uL — ABNORMAL HIGH (ref 4.0–10.5)
nRBC: 0 % (ref 0.0–0.2)

## 2024-03-18 LAB — URINALYSIS, ROUTINE W REFLEX MICROSCOPIC
Bilirubin Urine: NEGATIVE
Glucose, UA: NEGATIVE mg/dL
Hgb urine dipstick: NEGATIVE
Ketones, ur: 20 mg/dL — AB
Nitrite: NEGATIVE
Protein, ur: 30 mg/dL — AB
Specific Gravity, Urine: 1.012 (ref 1.005–1.030)
WBC, UA: >50 WBC/hpf (ref 0–5)
pH: 6 (ref 5.0–8.0)

## 2024-03-18 LAB — LIPASE, BLOOD: Lipase: 25 U/L (ref 11–51)

## 2024-03-18 MED ORDER — ONDANSETRON 4 MG PO TBDP
4.0000 mg | ORAL_TABLET | Freq: Once | ORAL | Status: AC
  Administered 2024-03-18: 4 mg via ORAL
  Filled 2024-03-18: qty 1

## 2024-03-18 MED ORDER — ONDANSETRON 4 MG PO TBDP: 4.0000 mg | ORAL_TABLET | Freq: Three times a day (TID) | ORAL | 0 refills | Status: AC | PRN

## 2024-03-18 MED ORDER — DULCOLAX 5 MG PO TBEC: 5.0000 mg | DELAYED_RELEASE_TABLET | Freq: Every day | ORAL | 0 refills | Status: AC | PRN

## 2024-03-18 MED ORDER — MORPHINE SULFATE (PF) 4 MG/ML IV SOLN
4.0000 mg | Freq: Once | INTRAVENOUS | Status: AC
  Administered 2024-03-18: 4 mg via INTRAMUSCULAR
  Filled 2024-03-18: qty 1

## 2024-03-18 MED ORDER — LACTATED RINGERS IV BOLUS: 1000.0000 mL | Freq: Once | INTRAVENOUS | Status: DC

## 2024-03-18 MED ORDER — ONDANSETRON HCL 4 MG/2ML IJ SOLN: 4.0000 mg | Freq: Once | INTRAMUSCULAR | Status: DC

## 2024-03-18 MED ORDER — MORPHINE SULFATE (PF) 4 MG/ML IV SOLN: 4.0000 mg | Freq: Once | INTRAVENOUS | Status: DC

## 2024-03-18 NOTE — ED Triage Notes (Signed)
 Pt here from home with c/o back pain and thinks that she may have an UTI ,pt has a urostomy and is normally in a power chair

## 2024-03-18 NOTE — ED Provider Notes (Signed)
 [facility] Provider Note    Event Date/Time   First MD Initiated Contact with Patient 03/18/24 1026     (approximate)   History   Chief Complaint Back Pain   HPI  [name] is a 53 y.o. female with past medical history of multiple sclerosis, neurogenic bladder status post ileal conduit diversion, neurogenic pain, and diastolic CHF who presents to the ED complaining back pain.  Patient reports that she has had increasing pain in the middle of her lower back coming around into both sides of her abdomen over the past 2 days.  She denies any falls or other trauma to her back, has chronic weakness to her lower extremities that is unchanged.  She denies any associated fevers or flank pain, has not had any nausea or vomiting.  She does report concern that she has a UTI as she has had similar symptoms with prior UTIs.     Physical Exam   Triage Vital Signs: ED Triage Vitals  Encounter Vitals Group     BP 03/18/24 1032 116/69     Girls Systolic BP Percentile --      Girls Diastolic BP Percentile --      Boys Systolic BP Percentile --      Boys Diastolic BP Percentile --      Pulse Rate 03/18/24 1032 70     Resp 03/18/24 1032 (!) 24     Temp 03/18/24 1032 98.4 F (36.9 C)     Temp src --      SpO2 03/18/24 1032 99 %     Weight --      Height --      Head Circumference --      Peak Flow --      Pain Score 03/18/24 1031 10     Pain Loc --      Pain Education --      Exclude from Growth Chart --     Most recent vital signs: Vitals:   03/18/24 1032  BP: 116/69  Pulse: 70  Resp: (!) 24  Temp: 98.4 F (36.9 C)  SpO2: 99%    Constitutional: Alert and oriented. Eyes: Conjunctivae are normal. Head: Atraumatic. Nose: No congestion/rhinnorhea. Mouth/Throat: Mucous membranes are moist.  Cardiovascular: Normal rate, regular rhythm. Grossly normal heart sounds.  2+ radial pulses bilaterally. Respiratory: Normal respiratory effort.  No  retractions. Lungs CTAB. Gastrointestinal: Soft and tender to palpation diffusely with no rebound or guarding.  No CVA tenderness bilaterally. No distention.  Urostomy in place with clear yellow urine. Musculoskeletal: No lower extremity tenderness nor edema.  Neurologic:  Normal speech and language. No gross focal neurologic deficits are appreciated.    ED Results / Procedures / Treatments   Labs (all labs ordered are listed, but only abnormal results are displayed) Labs Reviewed  CBC WITH DIFFERENTIAL/PLATELET - Abnormal; Notable for the following components:      Result Value   WBC 14.4 (*)    Hemoglobin 15.2 (*)    Neutro Abs 11.9 (*)    Monocytes Absolute 1.1 (*)    All other components within normal limits  COMPREHENSIVE METABOLIC PANEL WITH GFR - Abnormal; Notable for the following components:   CO2 21 (*)    Total Protein 9.4 (*)    AST 14 (*)    Total Bilirubin 2.1 (*)    All other components within normal limits  URINALYSIS, ROUTINE W REFLEX MICROSCOPIC - Abnormal; Notable for the following components:  Color, Urine YELLOW (*)    APPearance CLOUDY (*)    Ketones, ur 20 (*)    Protein, ur 30 (*)    Leukocytes,Ua LARGE (*)    Bacteria, UA FEW (*)    All other components within normal limits  URINE CULTURE  LIPASE, BLOOD     EKG  ED ECG REPORT I, Carlin Palin, the attending physician, personally viewed and interpreted this ECG.   Date: 03/18/2024  EKG Time: 11:34  Rate: 71  Rhythm: normal sinus rhythm  Axis: Normal  Intervals:none  ST&T Change: None  RADIOLOGY CT abdomen/pelvis reviewed and interpreted by me with no inflammatory changes, focal fluid collections, or dilated bowel loops.  PROCEDURES:  Critical Care performed: No  Procedures  ------------------------------------------------------------------------------------------------------------------- Fecal Disimpaction Procedure Note:  Performed by me:  Patient placed in the lateral  recumbent position with knees drawn towards chest. Nurse present for patient support. Large amount of hard brown stool removed. No complications during procedure.   ------------------------------------------------------------------------------------------------------------------   MEDICATIONS ORDERED IN ED: Medications  lactated ringers  bolus 1,000 mL (has no administration in time range)  ondansetron  (ZOFRAN -ODT) disintegrating tablet 4 mg (4 mg Oral Given 03/18/24 1237)  morphine  (PF) 4 MG/ML injection 4 mg (4 mg Intramuscular Given 03/18/24 1238)     IMPRESSION / MDM / ASSESSMENT AND PLAN / ED COURSE  I reviewed the triage vital signs and the nursing notes.                              53 y.o. female with past medical history of multiple sclerosis, neurogenic bladder status post ileal conduit diversion, neuropathic pain, and diastolic CHF who presents to the ED complaining of increasing back pain radiating around to both sides of her lower abdomen over the past 2 days.  Patient's presentation is most consistent with acute presentation with potential threat to life or bodily function.  Differential diagnosis includes, but is not limited to, UTI, pyelonephritis, bowel obstruction, appendicitis, diverticulitis, cholecystitis, biliary colic, hepatitis, pancreatitis, gastritis, chronic pain.  Patient nontoxic-appearing and in no acute distress, vital signs are unremarkable and do not appear concerning for sepsis.  She has diffuse abdominal tenderness but no CVA tenderness noted on exam.  We will check labs and perform CT imaging of her abdomen/pelvis.  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  Plan to treat symptomatically with IV morphine  and Zofran , hydrate with IV fluids and reassess.  Patient with difficult access and we were unable to obtain IV, she was given IM pain medication and ODT Zofran .  CT imaging without contrast shows constipation and  rectal stool ball, which are likely contributing to her discomfort, but no other acute findings noted.  Manual disimpaction performed, urinalysis difficult to interpret but was sent for culture.  With no CVA tenderness, low suspicion for pyelonephritis or other UTI at this time.  Patient is appropriate for discharge home with outpatient follow-up, was counseled to return to the ED for new or worsening symptoms.  Patient and family agree with plan.      FINAL CLINICAL IMPRESSION(S) / ED DIAGNOSES   Final diagnoses:  Acute midline low back pain without sciatica  Constipation, unspecified constipation type     Rx / DC Orders   ED Discharge Orders          Ordered    bisacodyl  (DULCOLAX) 5 MG EC tablet  Daily PRN  03/18/24 1520             Note:  This document was prepared using Dragon voice recognition software and may include unintentional dictation errors.   Willo Dunnings, MD 03/18/24 [phone]

## 2024-03-19 LAB — URINE CULTURE

## 2024-03-23 ENCOUNTER — Inpatient Hospital Stay
Admission: EM | Admit: 2024-03-23 | Discharge: 2024-03-25 | DRG: 699 | Disposition: A | Discharge status: Home Health | Attending: Student | Admitting: Student

## 2024-03-23 ENCOUNTER — Encounter: Payer: Self-pay | Admitting: Internal Medicine

## 2024-03-23 ENCOUNTER — Other Ambulatory Visit: Payer: Self-pay

## 2024-03-23 DIAGNOSIS — K592 Neurogenic bowel, not elsewhere classified: Secondary | ICD-10-CM | POA: Diagnosis present

## 2024-03-23 DIAGNOSIS — N3000 Acute cystitis without hematuria: Secondary | ICD-10-CM | POA: Diagnosis not present

## 2024-03-23 DIAGNOSIS — Z79899 Other long term (current) drug therapy: Secondary | ICD-10-CM

## 2024-03-23 DIAGNOSIS — Z7985 Long-term (current) use of injectable non-insulin antidiabetic drugs: Secondary | ICD-10-CM

## 2024-03-23 DIAGNOSIS — N3001 Acute cystitis with hematuria: Secondary | ICD-10-CM | POA: Diagnosis not present

## 2024-03-23 DIAGNOSIS — Z90710 Acquired absence of both cervix and uterus: Secondary | ICD-10-CM | POA: Diagnosis not present

## 2024-03-23 DIAGNOSIS — I5032 Chronic diastolic (congestive) heart failure: Secondary | ICD-10-CM | POA: Diagnosis present

## 2024-03-23 DIAGNOSIS — T83518A Infection and inflammatory reaction due to other urinary catheter, initial encounter: Principal | ICD-10-CM | POA: Diagnosis present

## 2024-03-23 DIAGNOSIS — Z87440 Personal history of urinary (tract) infections: Secondary | ICD-10-CM

## 2024-03-23 DIAGNOSIS — E876 Hypokalemia: Secondary | ICD-10-CM | POA: Diagnosis present

## 2024-03-23 DIAGNOSIS — Z936 Other artificial openings of urinary tract status: Secondary | ICD-10-CM | POA: Diagnosis not present

## 2024-03-23 DIAGNOSIS — G35 Multiple sclerosis: Secondary | ICD-10-CM | POA: Diagnosis present

## 2024-03-23 DIAGNOSIS — F32A Depression, unspecified: Secondary | ICD-10-CM | POA: Diagnosis present

## 2024-03-23 DIAGNOSIS — Y848 Other medical procedures as the cause of abnormal reaction of the patient, or of later complication, without mention of misadventure at the time of the procedure: Secondary | ICD-10-CM | POA: Diagnosis present

## 2024-03-23 DIAGNOSIS — N319 Neuromuscular dysfunction of bladder, unspecified: Secondary | ICD-10-CM | POA: Diagnosis present

## 2024-03-23 DIAGNOSIS — Z993 Dependence on wheelchair: Secondary | ICD-10-CM | POA: Diagnosis not present

## 2024-03-23 DIAGNOSIS — N390 Urinary tract infection, site not specified: Secondary | ICD-10-CM | POA: Diagnosis present

## 2024-03-23 DIAGNOSIS — G8220 Paraplegia, unspecified: Secondary | ICD-10-CM | POA: Diagnosis present

## 2024-03-23 DIAGNOSIS — Z8249 Family history of ischemic heart disease and other diseases of the circulatory system: Secondary | ICD-10-CM

## 2024-03-23 LAB — COMPREHENSIVE METABOLIC PANEL WITH GFR
ALT: 16 U/L (ref 0–44)
AST: 13 U/L — ABNORMAL LOW (ref 15–41)
Albumin: 4.2 g/dL (ref 3.5–5.0)
Alkaline Phosphatase: 56 U/L (ref 38–126)
Anion gap: 10 (ref 5–15)
BUN: 10 mg/dL (ref 6–20)
CO2: 25 mmol/L (ref 22–32)
Calcium: 9.5 mg/dL (ref 8.9–10.3)
Chloride: 102 mmol/L (ref 98–111)
Creatinine, Ser: 0.52 mg/dL (ref 0.44–1.00)
GFR, Estimated: >60 mL/min (ref >60)
Glucose, Bld: 91 mg/dL (ref 70–99)
Potassium: 3.9 mmol/L (ref 3.5–5.1)
Sodium: 137 mmol/L (ref 135–145)
Total Bilirubin: 0.9 mg/dL (ref 0.0–1.2)
Total Protein: 8.2 g/dL — ABNORMAL HIGH (ref 6.5–8.1)

## 2024-03-23 LAB — URINALYSIS, W/ REFLEX TO CULTURE (INFECTION SUSPECTED)
Bilirubin Urine: NEGATIVE
Glucose, UA: NEGATIVE mg/dL
Ketones, ur: 5 mg/dL — AB
Nitrite: POSITIVE — AB
Protein, ur: 100 mg/dL — AB
RBC / HPF: >50 RBC/hpf (ref 0–5)
Specific Gravity, Urine: 1.01 (ref 1.005–1.030)
Squamous Epithelial / HPF: 0 /HPF (ref 0–5)
WBC, UA: >50 WBC/hpf (ref 0–5)
pH: 7 (ref 5.0–8.0)

## 2024-03-23 LAB — CBC
HCT: 42.6 % (ref 36.0–46.0)
Hemoglobin: 13.6 g/dL (ref 12.0–15.0)
MCH: 29.1 pg (ref 26.0–34.0)
MCHC: 31.9 g/dL (ref 30.0–36.0)
MCV: 91 fL (ref 80.0–100.0)
Platelets: 315 10*3/uL (ref 150–400)
RBC: 4.68 MIL/uL (ref 3.87–5.11)
RDW: 13.2 % (ref 11.5–15.5)
WBC: 10.3 10*3/uL (ref 4.0–10.5)
nRBC: 0 % (ref 0.0–0.2)

## 2024-03-23 LAB — LACTIC ACID, PLASMA
Lactic Acid, Venous: 0.8 mmol/L (ref 0.5–1.9)
Lactic Acid, Venous: 1 mmol/L (ref 0.5–1.9)

## 2024-03-23 MED ORDER — BACLOFEN 10 MG PO TABS
20.0000 mg | ORAL_TABLET | Freq: Four times a day (QID) | ORAL | Status: DC
  Administered 2024-03-23 – 2024-03-25 (×9): 20 mg via ORAL
  Filled 2024-03-23 (×9): qty 2

## 2024-03-23 MED ORDER — AMANTADINE HCL 100 MG PO CAPS
200.0000 mg | ORAL_CAPSULE | Freq: Two times a day (BID) | ORAL | Status: DC
  Administered 2024-03-23 – 2024-03-25 (×4): 200 mg via ORAL
  Filled 2024-03-23 (×5): qty 2

## 2024-03-23 MED ORDER — SODIUM CHLORIDE 0.9 % IV SOLN
2.0000 g | INTRAVENOUS | Status: DC
  Administered 2024-03-24 – 2024-03-25 (×2): 2 g via INTRAVENOUS
  Filled 2024-03-23 (×2): qty 20

## 2024-03-23 MED ORDER — ACETAMINOPHEN 500 MG PO TABS
500.0000 mg | ORAL_TABLET | Freq: Four times a day (QID) | ORAL | Status: DC | PRN
  Administered 2024-03-23 – 2024-03-24 (×2): 500 mg via ORAL
  Filled 2024-03-23 (×2): qty 1

## 2024-03-23 MED ORDER — SODIUM CHLORIDE 0.9 % IV SOLN: Freq: Once | INTRAVENOUS | Status: AC

## 2024-03-23 MED ORDER — PHENOL 1.4 % MT LIQD: 1.0000 | OROMUCOSAL | Status: DC | PRN

## 2024-03-23 MED ORDER — HEPARIN SODIUM (PORCINE) 5000 UNIT/ML IJ SOLN
5000.0000 [IU] | Freq: Three times a day (TID) | INTRAMUSCULAR | Status: DC
  Administered 2024-03-23 – 2024-03-24 (×3): 5000 [IU] via SUBCUTANEOUS
  Filled 2024-03-23 (×3): qty 1

## 2024-03-23 MED ORDER — GABAPENTIN 100 MG PO CAPS
200.0000 mg | ORAL_CAPSULE | Freq: Three times a day (TID) | ORAL | Status: DC
  Administered 2024-03-23 – 2024-03-25 (×7): 200 mg via ORAL
  Filled 2024-03-23 (×7): qty 2

## 2024-03-23 MED ORDER — SENNA 8.6 MG PO TABS
2.0000 | ORAL_TABLET | Freq: Two times a day (BID) | ORAL | Status: DC
  Administered 2024-03-23 – 2024-03-25 (×4): 17.2 mg via ORAL
  Filled 2024-03-23 (×4): qty 2

## 2024-03-23 MED ORDER — PANTOPRAZOLE SODIUM 40 MG PO TBEC
40.0000 mg | DELAYED_RELEASE_TABLET | Freq: Every day | ORAL | Status: DC
  Administered 2024-03-24 – 2024-03-25 (×2): 40 mg via ORAL
  Filled 2024-03-23 (×2): qty 1

## 2024-03-23 MED ORDER — POLYETHYLENE GLYCOL 3350 17 G PO PACK
17.0000 g | PACK | Freq: Every day | ORAL | Status: DC
  Administered 2024-03-24 – 2024-03-25 (×2): 17 g via ORAL
  Filled 2024-03-23 (×2): qty 1

## 2024-03-23 MED ORDER — LACTULOSE 10 GM/15ML PO SOLN
10.0000 g | Freq: Every day | ORAL | Status: DC
  Administered 2024-03-24 – 2024-03-25 (×2): 10 g via ORAL
  Filled 2024-03-23 (×2): qty 30

## 2024-03-23 MED ORDER — ALBUTEROL SULFATE (2.5 MG/3ML) 0.083% IN NEBU
2.5000 mg | INHALATION_SOLUTION | RESPIRATORY_TRACT | Status: DC | PRN
Start: 2024-03-23 — End: 2024-03-26

## 2024-03-23 MED ORDER — BISACODYL 5 MG PO TBEC: 5.0000 mg | DELAYED_RELEASE_TABLET | Freq: Every day | ORAL | Status: DC | PRN

## 2024-03-23 MED ORDER — ONDANSETRON HCL 4 MG/2ML IJ SOLN: 4.0000 mg | Freq: Four times a day (QID) | INTRAMUSCULAR | Status: DC | PRN

## 2024-03-23 MED ORDER — SODIUM CHLORIDE 0.9 % IV SOLN
1.0000 g | Freq: Once | INTRAVENOUS | Status: AC
  Administered 2024-03-23: 1 g via INTRAVENOUS
  Filled 2024-03-23: qty 10

## 2024-03-23 MED ORDER — GABAPENTIN 400 MG PO CAPS
800.0000 mg | ORAL_CAPSULE | Freq: Every day | ORAL | Status: DC
  Administered 2024-03-23 – 2024-03-24 (×2): 800 mg via ORAL
  Filled 2024-03-23 (×2): qty 2

## 2024-03-23 MED ORDER — DULOXETINE HCL 30 MG PO CPEP
60.0000 mg | ORAL_CAPSULE | Freq: Every day | ORAL | Status: DC
  Administered 2024-03-24 – 2024-03-25 (×2): 60 mg via ORAL
  Filled 2024-03-23 (×2): qty 2

## 2024-03-23 MED ORDER — ONDANSETRON HCL 4 MG PO TABS: 4.0000 mg | ORAL_TABLET | Freq: Four times a day (QID) | ORAL | Status: DC | PRN

## 2024-03-23 NOTE — ED Notes (Signed)
IV team arrival

## 2024-03-23 NOTE — ED Notes (Signed)
 ED Provider at bedside.

## 2024-03-23 NOTE — ED Provider Notes (Signed)
 [facility] Provider Note    Event Date/Time   First MD Initiated Contact with Patient 03/23/24 1333     (approximate)   History   Nausea   HPI  [name] is a 53 y.o. female with history of multiple sclerosis, sepsis, neurogenic bladder, urostomy/ileal conduit who presents with complaints of nausea and feeling warm.  Patient reports this is typically precedes urinary tract infection which she states generally requires admission     Physical Exam   Triage Vital Signs: ED Triage Vitals  Encounter Vitals Group     BP --      Girls Systolic BP Percentile --      Girls Diastolic BP Percentile --      Boys Systolic BP Percentile --      Boys Diastolic BP Percentile --      Pulse Rate 03/23/24 1336 93     Resp 03/23/24 1336 18     Temp 03/23/24 1336 99.9 F (37.7 C)     Temp Source 03/23/24 1336 Oral     SpO2 03/23/24 1333 99 %     Weight 03/23/24 1338 72.6 kg (160 lb)     Height 03/23/24 1338 1.626 m (5' 4)     Head Circumference --      Peak Flow --      Pain Score 03/23/24 1337 7     Pain Loc --      Pain Education --      Exclude from Growth Chart --     Most recent vital signs: Vitals:   03/23/24 1336 03/23/24 1430  BP:  (!) 132/113  Pulse: 93 80  Resp: 18 17  Temp: 99.9 F (37.7 C)   SpO2: 99% 99%     General: Awake, no distress.  CV:  Good peripheral perfusion.  Resp:  Normal effort.  Abd:  No distention.  Other:     ED Results / Procedures / Treatments   Labs (all labs ordered are listed, but only abnormal results are displayed) Labs Reviewed  COMPREHENSIVE METABOLIC PANEL WITH GFR - Abnormal; Notable for the following components:      Result Value   Total Protein 8.2 (*)    AST 13 (*)    All other components within normal limits  URINALYSIS, W/ REFLEX TO CULTURE (INFECTION SUSPECTED) - Abnormal; Notable for the following components:   Color, Urine YELLOW (*)    APPearance TURBID (*)    Hgb urine dipstick  SMALL (*)    Ketones, ur 5 (*)    Protein, ur 100 (*)    Nitrite POSITIVE (*)    Leukocytes,Ua LARGE (*)    Bacteria, UA MANY (*)    All other components within normal limits  CULTURE, BLOOD (ROUTINE X 2)  CULTURE, BLOOD (ROUTINE X 2)  URINE CULTURE  CBC  LACTIC ACID, PLASMA  LACTIC ACID, PLASMA     EKG  ED ECG REPORT I, Lamar Price, the attending physician, personally viewed and interpreted this ECG.  Date: 03/23/2024  Rhythm: normal sinus rhythm QRS Axis: normal Intervals: normal ST/T Wave abnormalities: normal Narrative Interpretation: no evidence of acute ischemia    RADIOLOGY     PROCEDURES:  Critical Care performed:   Procedures   MEDICATIONS ORDERED IN ED: Medications  0.9 %  sodium chloride  infusion ( Intravenous New Bag/Given 03/23/24 1423)  cefTRIAXone  (ROCEPHIN ) 1 g in sodium chloride  0.9 % 100 mL IVPB (1 g Intravenous New Bag/Given 03/23/24 1458)  IMPRESSION / MDM / ASSESSMENT AND PLAN / ED COURSE  I reviewed the triage vital signs and the nursing notes. Patient's presentation is most consistent with acute presentation with potential threat to life or bodily function.  Patient presents with symptoms as above, she is at high risk for UTI, has had many times in the past and has developed sepsis from UTIs.  Review of records demonstrates she was here 5 days ago, at that time urine culture was likely contaminated.  We will obtain labs including lactic, blood cultures, urinalysis  Urinalysis is concerning for UTI, lactic acid is normal, white blood cell count is normal.  Patient does have a temperature of 99.9 and significant comorbidities.  She states she would feel more comfortable being treated in the hospital, will consult the hospitalist.  She is received a dose of IV Rocephin  here in the emergency department.        FINAL CLINICAL IMPRESSION(S) / ED DIAGNOSES   Final diagnoses:  Lower urinary tract infectious disease     Rx / DC  Orders   ED Discharge Orders     None        Note:  This document was prepared using Dragon voice recognition software and may include unintentional dictation errors.   Arlander Charleston, MD 03/23/24 [phone]

## 2024-03-23 NOTE — ED Notes (Signed)
 Attempts x3 made for IV placement with no success. IV team order placed. MD Kinner notified.

## 2024-03-23 NOTE — H&P (Signed)
 History and Physical    [name] FMW:[id number] DOB: 12-20-1970 DOA: 03/23/2024  PCP: Eino Tinnie BIRCH, MD  Patient coming from: home  I have personally briefly reviewed patient's old medical records in [facility]  Chief Complaint: concern for UTI  HPI: [name] is a 53 y.o. female with medical history significant of  Multiple sclerosis,complicated by left-sided weakness, neurogenic bladder s/p ileal conduit, diastolic CHF, neuropathic pain, depression and who has history of recurrent MDR UTI. Patient presents to ED BIB  EMS with nausea and concern for UTI. Patient notes no fever or chills, but states she felt more lethargic than usual and this typically occurs when she gets UTI. In addition to nausea and vomiting.  ED Course:  IN ED patient found to have labs within normal limits but + UA.  Patient admitted for further treatment   Labs: T 99.9, hr 93, rr 18, sat 99% bp 132/113 EKG: nsr, LVH, q in inferior leads Wbc 10.3, hgb 13.6, plt 315 UA: + wbc>50, bacteria many  Na 137, K 3.9, cr 0.52,  AST 13, Alkphos56 Lactic 0.8 Tx ctx  Review of Systems: As per HPI otherwise 10 point review of systems negative.   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 She has never used smokeless tobacco. She reports that she does not currently use alcohol. She reports that she does not use drugs.  Allergies  Allergen Reactions   Latex Swelling and Other (See Comments)     Reaction:  Facial swelling     Family History  Problem Relation Age of Onset   Osteoarthritis Mother    Hypertension Mother    Hyperlipidemia Mother    Thyroid disease Mother    Liver cancer Father    Asthma Brother     Prior to Admission medications   Medication Sig Start Date End Date Taking? Authorizing Provider  DULoxetine  (CYMBALTA ) 60 MG capsule Take 60 mg by mouth daily. 08/02/23 04/05/24 Yes [provider]  lactulose  (CHRONULAC ) 10 GM/15ML solution Take 10 g by mouth daily. 01/25/24  Yes [provider]  levofloxacin  (LEVAQUIN ) 500 MG tablet Take 500 mg by mouth daily. 01/26/24  Yes [provider]  polyethylene glycol (MIRALAX  / GLYCOLAX ) 17 g packet Take 17 g by mouth daily. 02/29/24  Yes [provider]  acetaminophen  (TYLENOL ) 500 MG tablet Take 500 mg by mouth every 6 (six) hours as needed.    [provider]  albuterol  (VENTOLIN  HFA) 108 (90 Base) MCG/ACT inhaler Inhale 2 puffs into the lungs every 6 (six) hours as needed for wheezing or shortness of breath. 05/05/23   Jhonny Calvin NOVAK, MD  amantadine  (SYMMETREL ) 100 MG capsule Take 200 mg by mouth 2 (two) times daily.    [provider]  baclofen  (LIORESAL ) 20 MG tablet Take 20 mg by mouth 4 (four) times daily. 08/31/23   [provider]  bisacodyl  (DULCOLAX) 5 MG EC tablet Take 1 tablet (5 mg  total) by mouth daily as needed for moderate constipation. 03/18/24 03/18/25  Willo Dunnings, MD  cholecalciferol  (VITAMIN D ) 1000 units tablet Take 1,000 Units by mouth daily.    [provider]  Cranberry 500 MG CHEW Chew 500 mg by mouth daily.    [provider]  gabapentin  (NEURONTIN ) 100 MG capsule Take 200 mg by mouth every morning. 06/17/21   [provider]  gabapentin  (NEURONTIN ) 800 MG tablet Take 800 mg by mouth at bedtime. 07/08/21   [provider]  guaiFENesin  (MUCINEX ) 600 MG 12 hr tablet Take 2 tablets (1,200 mg total) by mouth 2  (two) times daily. 05/05/23   Jhonny Calvin NOVAK, MD  ondansetron  (ZOFRAN -ODT) 4 MG disintegrating tablet Take 1 tablet (4 mg total) by mouth every 8 (eight) hours as needed. 03/18/24   Claudene Rover, MD  OZEMPIC, 1 MG/DOSE, 4 MG/3ML SOPN Inject 1 mg into the skin once a week. On Monday 09/02/21   [provider]  pantoprazole  (PROTONIX ) 40 MG tablet Take 1 tablet by mouth daily. 10/02/23   [provider]  senna (SENOKOT) 8.6 MG tablet Take 2 tablets by mouth 2 (two) times daily. 06/24/22   [provider]  Silver (AQUACEL AG FOAM) [phone] PADS Apply 1 each topically daily in the afternoon. 03/16/23   Wouk, Devaughn Sayres, MD  STIMULANT LAXATIVE 8.6-50 MG tablet Take 1 tablet by mouth at bedtime. 11/07/21   [provider]  Wound Dressings (FOAM DRESSING CIRCULAR BORDER) PADS Apply 1 each topically every 3 (three) days. 03/16/23   Kandis Devaughn Sayres, MD    Physical Exam: Vitals:   03/23/24 1333 03/23/24 1336 03/23/24 1338 03/23/24 1430  BP:    (!) 132/113  Pulse:  93  80  Resp:  18  17  Temp:  99.9 F (37.7 C)    TempSrc:  Oral    SpO2: 99% 99%  99%  Weight:   72.6 kg   Height:   5' 4 (1.626 m)     Constitutional: NAD, calm, comfortable Vitals:   03/23/24 1333 03/23/24 1336 03/23/24 1338 03/23/24 1430  BP:    (!) 132/113  Pulse:  93  80  Resp:  18  17  Temp:  99.9 F (37.7 C)    TempSrc:  Oral    SpO2: 99% 99%  99%  Weight:   72.6 kg   Height:   5' 4 (1.626 m)    Eyes: pupils equal lids and conjunctivae normal ENMT: Mucous membranes are dry.  Neck: normal, supple, no masses, no thyromegaly Respiratory: clear to auscultation bilaterally, no wheezing, no crackles. Normal respiratory effort. No accessory muscle use.  Cardiovascular: Regular rate and rhythm, no murmurs / rubs / gallops. No extremity edema. 2+ pedal pulses.  Abdomen: no tenderness, no masses palpated. No hepatosplenomegaly. Bowel sounds positive.  Musculoskeletal: no clubbing / cyanosis.  No joint deformity upper and lower extremities. Good ROM, no contractures. Normal muscle tone.  Skin: no rashes, lesions, ulcers. No induration Neurologic: CN 2-12 grossly intact. Sensation intact,.upper left extremity with contracture ,3/5 in lower extremity , RUE 4/5 Psychiatric: Normal judgment and insight. Alert and oriented x 3. Normal mood.    Labs on Admission: I have personally reviewed following labs and imaging studies  CBC: Recent Labs  Lab 03/18/24 1138 03/23/24 1418  WBC 14.4* 10.3  NEUTROABS 11.9*  --   HGB 15.2* 13.6  HCT 46.0 42.6  MCV 90.4 91.0  PLT 264 315   Basic Metabolic Panel:  Recent Labs  Lab 03/18/24 1138 03/23/24 1418  NA 136 137  K 3.9 3.9  CL 100 102  CO2 21* 25  GLUCOSE 86 91  BUN 14 10  CREATININE 0.46 0.52  CALCIUM 9.9 9.5   GFR: Estimated Creatinine Clearance: 78.6 mL/min (by C-G formula based on SCr of 0.52 mg/dL). Liver Function Tests: Recent Labs  Lab 03/18/24 1138 03/23/24 1418  AST 14* 13*  ALT 18 16  ALKPHOS 55 56  BILITOT 2.1* 0.9  PROT 9.4* 8.2*  ALBUMIN 4.8 4.2   Recent Labs  Lab 03/18/24 1138  LIPASE 25   No results for input(s): AMMONIA in the last 168 hours. Coagulation Profile: No results for input(s): INR, PROTIME in the last 168 hours. Cardiac Enzymes: No results for input(s): CKTOTAL, CKMB, CKMBINDEX, TROPONINI in the last 168 hours. BNP (last 3 results) No results for input(s): PROBNP in the last 8760 hours. HbA1C: No results for input(s): HGBA1C in the last 72 hours. CBG: No results for input(s): GLUCAP in the last 168 hours. Lipid Profile: No results for input(s): CHOL, HDL, LDLCALC, TRIG, CHOLHDL, LDLDIRECT in the last 72 hours. Thyroid Function Tests: No results for input(s): TSH, T4TOTAL, FREET4, T3FREE, THYROIDAB in the last 72 hours. Anemia Panel: No results for input(s): VITAMINB12, FOLATE, FERRITIN, TIBC, IRON, RETICCTPCT in the last 72  hours. Urine analysis:    Component Value Date/Time   COLORURINE YELLOW (A) 03/23/2024 1418   APPEARANCEUR TURBID (A) 03/23/2024 1418   APPEARANCEUR Cloudy 10/05/2014 2100   LABSPEC 1.010 03/23/2024 1418   LABSPEC 1.009 10/05/2014 2100   PHURINE 7.0 03/23/2024 1418   GLUCOSEU NEGATIVE 03/23/2024 1418   GLUCOSEU Negative 10/05/2014 2100   HGBUR SMALL (A) 03/23/2024 1418   BILIRUBINUR NEGATIVE 03/23/2024 1418   BILIRUBINUR Negative 10/05/2014 2100   KETONESUR 5 (A) 03/23/2024 1418   PROTEINUR 100 (A) 03/23/2024 1418   NITRITE POSITIVE (A) 03/23/2024 1418   LEUKOCYTESUR LARGE (A) 03/23/2024 1418   LEUKOCYTESUR 3+ 10/05/2014 2100    Radiological Exams on Admission: No results found.  EKG: Independently reviewed. See above  Assessment/Plan  UTI -hx of Neurogenic bladder s/p ilea conduit -hx of recurrent UTI with  MDRO  -last culture in system noted Klebsiella/proteus , sensitive to CTX  -will start on CTX  -f/u with urine culture -de-escalate as needed    Multiple Sclerosis  Paraplegic  Left upper extremity weakness  Neurogenic bladder Neurogenic bowel  -wheel chair bound  -resume home regimen  CHF pef -well compensated   DVT prophylaxis: heparin  Code Status: full/ as discussed per patient wishes in event of cardiac arrest  Family Communication: full/ as discussed per patient wishes in event of cardiac arrest  Disposition Plan:  patient  expected to be admitted greater than 2 midnights  Consults called:  none Admission status: med tele   Camila DELENA Ned MD [facility]   If 7PM-7AM, please contact night-coverage [email or web address] Password [facility]  03/23/2024, 3:58 PM

## 2024-03-23 NOTE — Plan of Care (Signed)

## 2024-03-23 NOTE — ED Notes (Signed)
 Prior to ABX administration multiple stick by this nurse for 2nd [facility] draw and no success.

## 2024-03-23 NOTE — ED Triage Notes (Addendum)
 Patient BIB EMS for nausea and concern for UTI. Pt states she feels like she has a UTI r/t nausea. Pt has a Urostomy. Pt alert and oriented at triage in no distress.

## 2024-03-24 DIAGNOSIS — N3000 Acute cystitis without hematuria: Secondary | ICD-10-CM

## 2024-03-24 LAB — COMPREHENSIVE METABOLIC PANEL WITH GFR
ALT: 13 U/L (ref 0–44)
AST: 13 U/L — ABNORMAL LOW (ref 15–41)
Albumin: 3.8 g/dL (ref 3.5–5.0)
Alkaline Phosphatase: 50 U/L (ref 38–126)
Anion gap: 10 (ref 5–15)
BUN: 11 mg/dL (ref 6–20)
CO2: 25 mmol/L (ref 22–32)
Calcium: 9.4 mg/dL (ref 8.9–10.3)
Chloride: 105 mmol/L (ref 98–111)
Creatinine, Ser: 0.52 mg/dL (ref 0.44–1.00)
GFR, Estimated: >60 mL/min (ref >60)
Glucose, Bld: 92 mg/dL (ref 70–99)
Potassium: 3.4 mmol/L — ABNORMAL LOW (ref 3.5–5.1)
Sodium: 140 mmol/L (ref 135–145)
Total Bilirubin: 0.7 mg/dL (ref 0.0–1.2)
Total Protein: 7.6 g/dL (ref 6.5–8.1)

## 2024-03-24 LAB — CBC
HCT: 38.5 % (ref 36.0–46.0)
Hemoglobin: 12.7 g/dL (ref 12.0–15.0)
MCH: 29.9 pg (ref 26.0–34.0)
MCHC: 33 g/dL (ref 30.0–36.0)
MCV: 90.6 fL (ref 80.0–100.0)
Platelets: 317 10*3/uL (ref 150–400)
RBC: 4.25 MIL/uL (ref 3.87–5.11)
RDW: 13.3 % (ref 11.5–15.5)
WBC: 10.4 10*3/uL (ref 4.0–10.5)
nRBC: 0 % (ref 0.0–0.2)

## 2024-03-24 LAB — HIV ANTIBODY (ROUTINE TESTING W REFLEX): HIV Screen 4th Generation wRfx: NONREACTIVE

## 2024-03-24 MED ORDER — ENOXAPARIN SODIUM 40 MG/0.4ML IJ SOSY
40.0000 mg | PREFILLED_SYRINGE | Freq: Every evening | INTRAMUSCULAR | Status: DC
  Administered 2024-03-24 – 2024-03-25 (×2): 40 mg via SUBCUTANEOUS
  Filled 2024-03-24: qty 0.4

## 2024-03-24 MED ORDER — MENTHOL 3 MG MT LOZG
1.0000 | LOZENGE | Freq: Once | OROMUCOSAL | Status: AC
  Administered 2024-03-24: 3 mg via ORAL
  Filled 2024-03-24: qty 9

## 2024-03-24 MED ORDER — POTASSIUM CHLORIDE CRYS ER 20 MEQ PO TBCR
40.0000 meq | EXTENDED_RELEASE_TABLET | Freq: Once | ORAL | Status: AC
  Administered 2024-03-24: 40 meq via ORAL
  Filled 2024-03-24: qty 2

## 2024-03-24 MED ORDER — MENTHOL 3 MG MT LOZG: 1.0000 | LOZENGE | OROMUCOSAL | Status: DC | PRN

## 2024-03-24 NOTE — Plan of Care (Signed)
  Problem: Education: Goal: Knowledge of General Education information will improve Description: Including pain rating scale, medication(s)/side effects and non-pharmacologic comfort measures Outcome: Progressing   Problem: Health Behavior/Discharge Planning: Goal: Ability to manage health-related needs will improve Outcome: Progressing   Problem: Nutrition: Goal: Adequate nutrition will be maintained Outcome: Progressing   Problem: Coping: Goal: Level of anxiety will decrease Outcome: Progressing   Problem: Activity: Goal: Risk for activity intolerance will decrease Outcome: Not Progressing

## 2024-03-24 NOTE — Progress Notes (Signed)
 [facility] Progress Note  Patient: [name]    FMW:[id number]  DOA: 03/23/2024     Date of Service: the patient was seen and examined on 03/24/2024  Chief Complaint  Patient presents with   Nausea   Brief hospital course: [name] is a 53 y.o. female with medical history significant of  Multiple sclerosis,complicated by left-sided weakness, neurogenic bladder s/p ileal conduit, diastolic CHF, neuropathic pain, depression and who has history of recurrent MDR UTI. Patient presents to ED BIB  EMS with nausea and concern for UTI. Patient notes no fever or chills, but states she felt more lethargic than usual and this typically occurs when she gets UTI. In addition to nausea and vomiting.   ED Course:  IN ED patient found to have labs within normal limits but + UA.  Patient admitted for further treatment    Labs: T 99.9, hr 93, rr 18, sat 99% bp 132/113 EKG: nsr, LVH, q in inferior leads Wbc 10.3, hgb 13.6, plt 315 UA: + wbc>50, bacteria many  Na 137, K 3.9, cr 0.52,  AST 13, Alkphos56 Lactic 0.8 Tx ctx  Assessment and Plan: UTI -hx of Neurogenic bladder s/p ilea conduit -hx of recurrent UTI with  MDRO  -last culture in system noted Klebsiella/proteus , sensitive to CTX  -will start on CTX  -f/u with urine culture -de-escalate as needed      Multiple Sclerosis  Paraplegic  Left upper extremity weakness  Neurogenic bladder Neurogenic bowel  -wheel chair bound  -resume home regimen   CHF pef -well compensated  Hypokalemia, mild, potassium repleted. Monitor electrolytes and replete as needed.   Body mass index is 27.46 kg/m.  Interventions:  Diet: Regular diet DVT Prophylaxis: Subcutaneous Lovenox    Advance goals of care discussion: Full code  Family Communication: family was not present at bedside, at the time of interview.  The pt provided permission to discuss medical plan with the family. Opportunity was given to ask question and all  questions were answered satisfactorily.   Disposition:  Pt is from Home, admitted with UTI, still on IV antibiotics, urine culture pending, which precludes a safe discharge. Discharge to home, when stable, most likely tomorrow a.m.  Subjective: No significant overnight events, patient was feeling some scratchy throat after eating something otherwise denied any complaints.  Patient is willing to try lozenges,   Physical Exam: General: NAD, lying comfortably Appear in no distress, affect appropriate Eyes: PERRLA ENT: Oral Mucosa Clear, moist  Neck: no JVD,  Cardiovascular: S1 and S2 Present, no Murmur,  Respiratory: good respiratory effort, Bilateral Air entry equal and Decreased, no Crackles, no wheezes Abdomen: Bowel Sound present, Soft and no tenderness,  Skin: no rashes Extremities: no Pedal edema, no calf tenderness Neurologic: without any new focal findings Gait not checked due to patient safety concerns  Vitals:   03/23/24 1621 03/23/24 1946 03/24/24 0409 03/24/24 0739  BP: (!) 159/67 138/72 136/69 (!) 120/54  Pulse: 70 95 82 80  Resp: 18 16 16 17   Temp: 97.8 F (36.6 C) 98.2 F (36.8 C) 98.1 F (36.7 C) 98.5 F (36.9 C)  TempSrc:  Oral Oral Oral  SpO2: 97% 100% 97% 98%  Weight:      Height:        Intake/Output Summary (Last 24 hours) at 03/24/2024 1256 Last data filed at 03/23/2024 1700 Gross per 24 hour  Intake 343.33 ml  Output --  Net 343.33 ml   [employer]   03/23/24  1338  Weight: 72.6 kg    Data Reviewed: I have personally reviewed and interpreted daily labs, tele strips, imagings as discussed above. I reviewed all nursing notes, pharmacy notes, vitals, pertinent old records I have discussed plan of care as described above with RN and patient/family.  CBC: Recent Labs  Lab 03/18/24 1138 03/23/24 1418 03/24/24 0523  WBC 14.4* 10.3 10.4  NEUTROABS 11.9*  --   --   HGB 15.2* 13.6 12.7  HCT 46.0 42.6 38.5  MCV 90.4 91.0 90.6  PLT 264 315 317    Basic Metabolic Panel: Recent Labs  Lab 03/18/24 1138 03/23/24 1418 03/24/24 0523  NA 136 137 140  K 3.9 3.9 3.4*  CL 100 102 105  CO2 21* 25 25  GLUCOSE 86 91 92  BUN 14 10 11   CREATININE 0.46 0.52 0.52  CALCIUM 9.9 9.5 9.4    Studies: No results found.  Scheduled Meds:  amantadine   200 mg Oral BID   baclofen   20 mg Oral QID   DULoxetine   60 mg Oral Daily   gabapentin   200 mg Oral TID   gabapentin   800 mg Oral QHS   heparin   5,000 Units Subcutaneous Q8H   lactulose   10 g Oral Daily   menthol-cetylpyridinium  1 lozenge Oral Once   pantoprazole   40 mg Oral Daily   polyethylene glycol  17 g Oral Daily   senna  2 tablet Oral BID   Continuous Infusions:  cefTRIAXone  (ROCEPHIN )  IV 2 g (03/24/24 0941)   PRN Meds: acetaminophen , albuterol , bisacodyl , menthol-cetylpyridinium **FOLLOWED BY** menthol-cetylpyridinium, ondansetron  **OR** ondansetron  (ZOFRAN ) IV, phenol  Time spent: 35 minutes  Author: ELVAN SOR. MD [facility] 03/24/2024 12:56 PM  To reach On-call, see care teams to locate the attending and reach out to them via [email or web address]. If 7PM-7AM, please contact night-coverage If you still have difficulty reaching the attending provider, please page the [facility] (Director on Call) for [facility] on amion for assistance.

## 2024-03-24 NOTE — Plan of Care (Signed)

## 2024-03-25 DIAGNOSIS — N3000 Acute cystitis without hematuria: Secondary | ICD-10-CM | POA: Diagnosis not present

## 2024-03-25 LAB — CBC
HCT: 39.2 % (ref 36.0–46.0)
Hemoglobin: 12.5 g/dL (ref 12.0–15.0)
MCH: 29.1 pg (ref 26.0–34.0)
MCHC: 31.9 g/dL (ref 30.0–36.0)
MCV: 91.4 fL (ref 80.0–100.0)
Platelets: 327 10*3/uL (ref 150–400)
RBC: 4.29 MIL/uL (ref 3.87–5.11)
RDW: 13.2 % (ref 11.5–15.5)
WBC: 9.2 10*3/uL (ref 4.0–10.5)
nRBC: 0 % (ref 0.0–0.2)

## 2024-03-25 LAB — BASIC METABOLIC PANEL WITH GFR
Anion gap: 11 (ref 5–15)
BUN: 13 mg/dL (ref 6–20)
CO2: 25 mmol/L (ref 22–32)
Calcium: 9.3 mg/dL (ref 8.9–10.3)
Chloride: 104 mmol/L (ref 98–111)
Creatinine, Ser: 0.53 mg/dL (ref 0.44–1.00)
GFR, Estimated: >60 mL/min (ref >60)
Glucose, Bld: 105 mg/dL — ABNORMAL HIGH (ref 70–99)
Potassium: 4 mmol/L (ref 3.5–5.1)
Sodium: 140 mmol/L (ref 135–145)

## 2024-03-25 LAB — URINE CULTURE

## 2024-03-25 LAB — PHOSPHORUS: Phosphorus: 3.5 mg/dL (ref 2.5–4.6)

## 2024-03-25 LAB — MAGNESIUM: Magnesium: 2.3 mg/dL (ref 1.7–2.4)

## 2024-03-25 MED ORDER — GUAIFENESIN-DM 100-10 MG/5ML PO SYRP
5.0000 mL | ORAL_SOLUTION | ORAL | Status: DC | PRN
  Administered 2024-03-25: 5 mL via ORAL
  Filled 2024-03-25: qty 10

## 2024-03-25 MED ORDER — SULFAMETHOXAZOLE-TRIMETHOPRIM 400-80 MG PO TABS: 0.5000 | ORAL_TABLET | Freq: Every day | ORAL | 3 refills | Status: DC

## 2024-03-25 NOTE — Plan of Care (Incomplete)
  Problem: Education: Goal: Knowledge of General Education information will improve Description: Including pain rating scale, medication(s)/side effects and non-pharmacologic comfort measures 03/25/2024 1358 by Vicci Aureliano DEL, RN Outcome: Adequate for Discharge 03/25/2024 1310 by Vicci Aureliano DEL, RN Outcome: Adequate for Discharge   Problem: Clinical Measurements: Goal: Ability to maintain clinical measurements within normal limits will improve 03/25/2024 1358 by Vicci Aureliano DEL, RN Outcome: Adequate for Discharge 03/25/2024 1310 by Vicci Aureliano DEL, RN Outcome: Adequate for Discharge Goal: Will remain free from infection 03/25/2024 1358 by Vicci Aureliano DEL, RN Outcome: Adequate for Discharge 03/25/2024 1310 by Vicci Aureliano DEL, RN Outcome: Adequate for Discharge Goal: Diagnostic test results will improve 03/25/2024 1358 by Vicci Aureliano DEL, RN Outcome: Adequate for Discharge 03/25/2024 1310 by Vicci Aureliano DEL, RN Outcome: Adequate for Discharge Goal: Respiratory complications will improve 03/25/2024 1358 by Vicci Aureliano DEL, RN Outcome: Adequate for Discharge 03/25/2024 1310 by Vicci Aureliano DEL, RN Outcome: Adequate for Discharge Goal: Cardiovascular complication will be avoided 03/25/2024 1358 by Vicci Aureliano DEL, RN Outcome: Adequate for Discharge 03/25/2024 1310 by Vicci Aureliano DEL, RN Outcome: Adequate for Discharge   Problem: Health Behavior/Discharge Planning: Goal: Ability to manage health-related needs will improve 03/25/2024 1358 by Vicci Aureliano DEL, RN Outcome: Adequate for Discharge 03/25/2024 1310 by Vicci Aureliano DEL, RN Outcome: Adequate for Discharge   Problem: Activity: Goal: Risk for activity intolerance will decrease 03/25/2024 1358 by Vicci Aureliano DEL, RN Outcome: Adequate for Discharge 03/25/2024 1310 by Vicci Aureliano DEL, RN Outcome: Adequate for Discharge   Problem: Nutrition: Goal: Adequate nutrition will be maintained 03/25/2024 1358 by Vicci Aureliano DEL, RN Outcome:  Adequate for Discharge 03/25/2024 1310 by Vicci Aureliano DEL, RN Outcome: Adequate for Discharge   Problem: Coping: Goal: Level of anxiety will decrease 03/25/2024 1358 by Vicci Aureliano DEL, RN Outcome: Adequate for Discharge 03/25/2024 1310 by Vicci Aureliano DEL, RN Outcome: Adequate for Discharge   Problem: Elimination: Goal: Will not experience complications related to bowel motility 03/25/2024 1358 by Vicci Aureliano DEL, RN Outcome: Adequate for Discharge 03/25/2024 1310 by Vicci Aureliano DEL, RN Outcome: Adequate for Discharge Goal: Will not experience complications related to urinary retention 03/25/2024 1358 by Vicci Aureliano DEL, RN Outcome: Adequate for Discharge 03/25/2024 1310 by Vicci Aureliano DEL, RN Outcome: Adequate for Discharge   Problem: Pain Managment: Goal: General experience of comfort will improve and/or be controlled 03/25/2024 1358 by Vicci Aureliano DEL, RN Outcome: Adequate for Discharge 03/25/2024 1310 by Vicci Aureliano DEL, RN Outcome: Adequate for Discharge   Problem: Safety: Goal: Ability to remain free from injury will improve 03/25/2024 1358 by Vicci Aureliano DEL, RN Outcome: Adequate for Discharge 03/25/2024 1310 by Vicci Aureliano DEL, RN Outcome: Adequate for Discharge   Problem: Skin Integrity: Goal: Risk for impaired skin integrity will decrease 03/25/2024 1358 by Vicci Aureliano DEL, RN Outcome: Adequate for Discharge 03/25/2024 1310 by Vicci Aureliano DEL, RN Outcome: Adequate for Discharge   PT D/C to home. AVS reviewed follow up questions answered. IV removed all belongings returned.  BP 134/73 (BP Location: Left Arm)   Pulse 73   Temp 98 F (36.7 C) (Oral)   Resp 15   Ht 5' 4 (1.626 m)   Wt 72.6 kg   SpO2 98%   BMI 27.46 kg/m  Aureliano DEL Vicci 03/25/24

## 2024-03-25 NOTE — Plan of Care (Signed)
  Problem: Education: Goal: Knowledge of General Education information will improve Description: Including pain rating scale, medication(s)/side effects and non-pharmacologic comfort measures Outcome: Progressing   Problem: Health Behavior/Discharge Planning: Goal: Ability to manage health-related needs will improve Outcome: Progressing   Problem: Clinical Measurements: Goal: Respiratory complications will improve Outcome: Progressing   Problem: Nutrition: Goal: Adequate nutrition will be maintained Outcome: Progressing   Problem: Activity: Goal: Risk for activity intolerance will decrease Outcome: Not Progressing

## 2024-03-25 NOTE — TOC Progression Note (Signed)
 Transition of Care [facility]) - Progression Note    Patient Details  Name: [name] MRN: [id number] Date of Birth: 1970/10/10  [facility] CM/SW Contact  Lorraine LILLETTE Fenton, LCSW Phone Number: 03/25/2024, 2:39 PM  Clinical Narrative:    MD advised pt ready for DC and will need transport home via ambulance.  CSW went to speak to pt at bedside to determine if family will be home to receive her.  PT called her mother- together they agreed pt's aid can be contacted to receive her at the home.  The agreed upon time is after 3 PM today.  2:35: Call to [facility] scheduled transport after 3- 4PM is the scheduled time for pick up. CSW sent a [place] to RN advising of pick up time- who agreed to also advise pt. PT discharging home, no other TOC needs.      Barriers to Discharge: Continued Medical Work up  Expected Discharge Plan and Services         Expected Discharge Date: 03/25/24                                     Social Determinants of Health (SDOH) Interventions SDOH Screenings   Food Insecurity: No Food Insecurity (03/23/2024)  Housing: Low Risk  (03/23/2024)  Transportation Needs: No Transportation Needs (03/23/2024)  Utilities: Not At Risk (03/23/2024)  Financial Resource Strain: Low Risk  (01/24/2024)   Received from [facility]  Physical Activity: Inactive (11/03/2022)   Received from [facility]  Social Connections: Moderately Isolated (11/03/2022)   Received from [facility]  Stress: No Stress Concern Present (11/03/2022)   Received from [facility]  Tobacco Use: Low Risk  (03/18/2024)  Health Literacy: Low Risk  (11/04/2023)   Received from [facility]    Readmission Risk Interventions     No data to display

## 2024-03-25 NOTE — Discharge Summary (Signed)
 [facility] Discharge Summary   Patient: [name] FMW:[id number]  PCP: Eino Tinnie BIRCH, MD  Date of admission: 03/23/2024   Date of discharge: 03/25/2024     Discharge Diagnoses:  Principal Problem:   UTI (urinary tract infection)   Admitted From: Home Disposition:  Home with [facility] services  Recommendations for Outpatient Follow-up:  PCP: in 1 wk Follow up LABS/TEST:  None   Follow-up Information     Eino Tinnie BIRCH, MD Follow up in 1 week(s).   Specialty: Internal Medicine Contact information: [address] [place] [address] [phone]                Diet recommendation: Regular diet  Activity: The patient is advised to gradually reintroduce usual activities, as tolerated  Discharge Condition: stable  Code Status: Full code   History of present illness: As per the H and P dictated on admission.  Hospital Course:  [name] is a 53 y.o. female with medical history significant of Multiple sclerosis,complicated by left-sided weakness, neurogenic bladder s/p ileal conduit, diastolic CHF, neuropathic pain, depression and who has history of recurrent MDR UTI. Patient presents to ED BIB  EMS with nausea and concern for UTI. Patient notes no fever or chills, but states she felt more lethargic than usual and this typically occurs when she gets UTI. In addition to nausea and vomiting.   ED Course:  IN ED patient found to have labs within normal limits but + UA.  Patient admitted for further treatment    Labs: T 99.9, hr 93, rr 18, sat 99% bp 132/113 EKG: nsr, LVH, q in inferior leads Wbc 10.3, hgb 13.6, plt 315 UA: + wbc>50, bacteria many  Na 137, K 3.9, cr 0.52,  AST 13, Alkphos56 Lactic 0.8 Tx ctx   Assessment and Plan:  # UTI hx of Neurogenic bladder s/p ilea conduit hx of recurrent UTI with  MDRO  last culture in system noted Klebsiella/proteus , sensitive to CTX  S/p ceftriaxone  given during hospital stay.  Urine culture  grew multiple species, recommended consider to recollect urine.  Currently patient is asymptomatic.  Patient was discharged on prophylactic antibiotics, Bactrim  400-80 half tablet p.o. 3 times daily.  Patient was recommended to follow with PCP in 1 week.    # Multiple Sclerosis, Paraplegic and Left upper extremity weakness, Neurogenic bladder and Neurogenic bowel  She is wheel chair bound. resumed home regimen # CHF pef: well compensated # Hypokalemia, mild, potassium repleted.  Resolved     Body mass index is 27.46 kg/m.  Nutrition Interventions:  On the day of the discharge the patient's vitals were stable, and no other acute medical condition were reported by patient. the patient was felt safe to be discharge at Home with Home health.  Consultants: None Procedures: None  Discharge Exam: General: Appear in no distress, no Rash; Oral Mucosa Clear, moist. Cardiovascular: S1 and S2 Present, no Murmur, Respiratory: normal respiratory effort, Bilateral Air entry present and no Crackles, no wheezes Abdomen: Bowel Sound present, Soft and no tenderness, no hernia Extremities: no Pedal edema, no calf tenderness Neurology: alert and oriented to time, place, and person affect appropriate.  Filed Weights   03/23/24 1338  Weight: 72.6 kg   Vitals:   03/25/24 0410 03/25/24 0822  BP: 125/71 134/73  Pulse: 78 73  Resp: 15 15  Temp: 98 F (36.7 C) 98 F (36.7 C)  SpO2: 99% 98%    DISCHARGE MEDICATION: Allergies as of 03/25/2024  Reactions   Latex Swelling, Other (See Comments)   Reaction:  Facial swelling    Vancomycin  Itching        Medication List     TAKE these medications    acetaminophen  500 MG tablet Commonly known as: TYLENOL  Take 500 mg by mouth every 6 (six) hours as needed.   amantadine  100 MG capsule Commonly known as: SYMMETREL  Take 200 mg by mouth 2 (two) times daily.   Aquacel Ag Foam 6X6 Pads Apply 1 each topically daily in the afternoon.    baclofen  20 MG tablet Commonly known as: LIORESAL  Take 20 mg by mouth 4 (four) times daily.   Dulcolax 5 MG EC tablet Generic drug: bisacodyl  Take 1 tablet (5 mg total) by mouth daily as needed for moderate constipation.   DULoxetine  60 MG capsule Commonly known as: CYMBALTA  Take 60 mg by mouth daily.   Foam Dressing Circular Border Pads Apply 1 each topically every 3 (three) days.   gabapentin  100 MG capsule Commonly known as: NEURONTIN  Take 200 mg by mouth in the morning, at noon, and at bedtime.   gabapentin  800 MG tablet Commonly known as: NEURONTIN  Take 800 mg by mouth at bedtime.   lactulose  10 GM/15ML solution Commonly known as: CHRONULAC  Take 10 g by mouth daily.   ondansetron  4 MG disintegrating tablet Commonly known as: ZOFRAN -ODT Take 1 tablet (4 mg total) by mouth every 8 (eight) hours as needed.   pantoprazole  40 MG tablet Commonly known as: PROTONIX  Take 1 tablet by mouth daily.   senna 8.6 MG tablet Commonly known as: SENOKOT Take 2 tablets by mouth 2 (two) times daily.   sulfamethoxazole -trimethoprim  400-80 MG tablet Commonly known as: Bactrim  Take 0.5 tablets by mouth daily.       Allergies  Allergen Reactions   Latex Swelling and Other (See Comments)    Reaction:  Facial swelling    Vancomycin  Itching   Discharge Instructions     Call MD for:  difficulty breathing, headache or visual disturbances   Complete by: As directed    Call MD for:  extreme fatigue   Complete by: As directed    Call MD for:  persistant dizziness or light-headedness   Complete by: As directed    Call MD for:  persistant nausea and vomiting   Complete by: As directed    Call MD for:  severe uncontrolled pain   Complete by: As directed    Call MD for:  temperature >100.4   Complete by: As directed    Diet general   Complete by: As directed    Discharge instructions   Complete by: As directed    Follow-up with PCP in 1 week   Increase activity slowly    Complete by: As directed        The results of significant diagnostics from this hospitalization (including imaging, microbiology, ancillary and laboratory) are listed below for reference.    Significant Diagnostic Studies: CT Renal Stone Study Result Date: 03/18/2024 CLINICAL DATA:  Abdomen/flank pain. EXAM: CT ABDOMEN AND PELVIS WITHOUT CONTRAST TECHNIQUE: Multidetector CT imaging of the abdomen and pelvis was performed following the standard protocol without IV contrast. RADIATION DOSE REDUCTION: This exam was performed according to the departmental dose-optimization program which includes automated exposure control, adjustment of the mA and/or kV according to patient size and/or use of iterative reconstruction technique. COMPARISON:  04/25/2023. FINDINGS: Lower chest: Bibasilar atelectasis. Heart size normal. No pericardial effusion. No pleural effusion. Distal esophagus is grossly unremarkable.  Hepatobiliary: Liver is grossly unremarkable. Gallstones. No biliary ductal dilatation. Pancreas: Negative. Spleen: Negative. Adrenals/Urinary Tract: Adrenal glands are unremarkable. Scarring in the kidneys. Small low-attenuation lesions in the kidneys. No specific follow-up necessary. Ureters are decompressed. Cystectomy with a right lower quadrant ileal conduit. Stomach/Bowel: Stomach and majority of the small bowel are unremarkable. Ileal conduit creation in the right lower quadrant with a parastomal hernia containing unobstructed small bowel. Colon and rectum are mildly distended with gas and stool. Vascular/Lymphatic: Atherosclerotic calcification of the aorta. No pathologically enlarged lymph nodes. Reproductive: Hysterectomy.  No adnexal mass. Other: No free fluid.  Mesenteries and peritoneum are unremarkable. Musculoskeletal: Sarcopenia. Osteopenia. Healed left sacral decubitus ulcer. Degenerative changes in the spine. L5-S1 posterior lumbar interbody fusion. IMPRESSION: 1. Possible mild constipation  and ileus with fecal impaction. 2. Cystectomy with a right lower quadrant ileal conduit. 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 3. Cholelithiasis. 4.  Aortic atherosclerosis (ICD10-I70.0). Electronically Signed   By: Newell Eke M.D.   On: 03/18/2024 14:20    Microbiology: Recent Results (from the past 240 hours)  Urine Culture     Status: Abnormal   Collection Time: 03/18/24 11:30 AM   Specimen: Urine, Random  Result Value Ref Range Status   Specimen Description   Final    URINE, RANDOM Performed at [facility], [address] [place], [place] [address]    Special Requests   Final    NONE Performed at [facility], [address] [place], [place] [address]    Culture MULTIPLE SPECIES PRESENT, SUGGEST RECOLLECTION (A)  Final   Report Status 03/19/2024 FINAL  Final  Blood culture (routine x 2)     Status: None (Preliminary result)   Collection Time: 03/23/24  2:18 PM   Specimen: BLOOD  Result Value Ref Range Status   Specimen Description BLOOD BLOOD RIGHT FOREARM  Final   Special Requests   Final    BOTTLES DRAWN AEROBIC AND ANAEROBIC Blood Culture results may not be optimal due to an inadequate volume of blood received in culture bottles   Culture   Final    NO GROWTH 2 DAYS Performed at [facility], [address] [place], [place] [address]    Report Status PENDING  Incomplete  Urine Culture     Status: Abnormal   Collection Time: 03/23/24  2:18 PM   Specimen: Urine, Random  Result Value Ref Range Status   Specimen Description   Final    URINE, RANDOM Performed at [facility], [address], [place], [place] [address]    Special Requests   Final    NONE Reflexed from [id number] Performed at [facility], [address] [place], [place] [address]    Culture MULTIPLE SPECIES PRESENT, SUGGEST RECOLLECTION (A)  Final   Report Status 03/25/2024 FINAL  Final  Blood culture (routine x 2)      Status: None (Preliminary result)   Collection Time: 03/23/24  4:52 PM   Specimen: BLOOD  Result Value Ref Range Status   Specimen Description BLOOD BLOOD RIGHT HAND  Final   Special Requests   Final    BOTTLES DRAWN AEROBIC AND ANAEROBIC Blood Culture results may not be optimal due to an inadequate volume of blood received in culture bottles   Culture   Final    NO GROWTH 2 DAYS Performed at [facility], [address], [place], [place] [address]    Report Status PENDING  Incomplete  Labs: CBC: Recent Labs  Lab 03/23/24 1418 03/24/24 0523 03/25/24 0433  WBC 10.3 10.4 9.2  HGB 13.6 12.7 12.5  HCT 42.6 38.5 39.2  MCV 91.0 90.6 91.4  PLT 315 317 327   Basic Metabolic Panel: Recent Labs  Lab 03/23/24 1418 03/24/24 0523 03/25/24 0433  NA 137 140 140  K 3.9 3.4* 4.0  CL 102 105 104  CO2 25 25 25   GLUCOSE 91 92 105*  BUN 10 11 13   CREATININE 0.52 0.52 0.53  CALCIUM 9.5 9.4 9.3  MG  --   --  2.3  PHOS  --   --  3.5   Liver Function Tests: Recent Labs  Lab 03/23/24 1418 03/24/24 0523  AST 13* 13*  ALT 16 13  ALKPHOS 56 50  BILITOT 0.9 0.7  PROT 8.2* 7.6  ALBUMIN 4.2 3.8   No results for input(s): LIPASE, AMYLASE in the last 168 hours. No results for input(s): AMMONIA in the last 168 hours. Cardiac Enzymes: No results for input(s): CKTOTAL, CKMB, CKMBINDEX, TROPONINI in the last 168 hours. BNP (last 3 results) No results for input(s): BNP in the last 8760 hours. CBG: No results for input(s): GLUCAP in the last 168 hours.  Time spent: 35 minutes  Signed:  Elvan Sor  [facility] 03/25/2024 1:30 PM

## 2024-03-25 NOTE — Plan of Care (Signed)
  Problem: Education: Goal: Knowledge of General Education information will improve Description: Including pain rating scale, medication(s)/side effects and non-pharmacologic comfort measures Outcome: Adequate for Discharge   Problem: Health Behavior/Discharge Planning: Goal: Ability to manage health-related needs will improve Outcome: Adequate for Discharge   Problem: Clinical Measurements: Goal: Ability to maintain clinical measurements within normal limits will improve Outcome: Adequate for Discharge Goal: Will remain free from infection Outcome: Adequate for Discharge Goal: Diagnostic test results will improve Outcome: Adequate for Discharge Goal: Respiratory complications will improve Outcome: Adequate for Discharge Goal: Cardiovascular complication will be avoided Outcome: Adequate for Discharge   Problem: Activity: Goal: Risk for activity intolerance will decrease Outcome: Adequate for Discharge   Problem: Nutrition: Goal: Adequate nutrition will be maintained Outcome: Adequate for Discharge   Problem: Coping: Goal: Level of anxiety will decrease Outcome: Adequate for Discharge   Problem: Elimination: Goal: Will not experience complications related to bowel motility Outcome: Adequate for Discharge Goal: Will not experience complications related to urinary retention Outcome: Adequate for Discharge   Problem: Pain Managment: Goal: General experience of comfort will improve and/or be controlled Outcome: Adequate for Discharge   Problem: Skin Integrity: Goal: Risk for impaired skin integrity will decrease Outcome: Adequate for Discharge   Problem: Safety: Goal: Ability to remain free from injury will improve Outcome: Adequate for Discharge   Problem: Pain Managment: Goal: General experience of comfort will improve and/or be controlled Outcome: Adequate for Discharge

## 2024-03-25 NOTE — Care Management Important Message (Signed)
 Important Message  Patient Details  Name: [name] MRN: [id number] Date of Birth: [birth year]   Important Message Given:  Yes - Medicare IM     [name] 03/25/2024, 11:56 AM

## 2024-03-27 DIAGNOSIS — Z09 Encounter for follow-up examination after completed treatment for conditions other than malignant neoplasm: Principal | ICD-10-CM

## 2024-03-28 LAB — CULTURE, BLOOD (ROUTINE X 2)
Culture: NO GROWTH
Culture: NO GROWTH

## 2024-04-01 DIAGNOSIS — Z09 Encounter for follow-up examination after completed treatment for conditions other than malignant neoplasm: Principal | ICD-10-CM

## 2024-04-05 DIAGNOSIS — Z9889 Other specified postprocedural states: Principal | ICD-10-CM

## 2024-04-05 DIAGNOSIS — R1310 Dysphagia, unspecified: Principal | ICD-10-CM

## 2024-04-05 DIAGNOSIS — I10 Essential (primary) hypertension: Principal | ICD-10-CM

## 2024-04-05 DIAGNOSIS — E785 Hyperlipidemia, unspecified: Principal | ICD-10-CM

## 2024-04-05 DIAGNOSIS — L89144 Pressure ulcer of left lower back, stage 4: Principal | ICD-10-CM

## 2024-04-05 DIAGNOSIS — K5909 Other constipation: Principal | ICD-10-CM

## 2024-04-05 DIAGNOSIS — G8220 Paraplegia, unspecified: Principal | ICD-10-CM

## 2024-04-05 DIAGNOSIS — Z09 Encounter for follow-up examination after completed treatment for conditions other than malignant neoplasm: Principal | ICD-10-CM

## 2024-04-05 DIAGNOSIS — N319 Neuromuscular dysfunction of bladder, unspecified: Principal | ICD-10-CM

## 2024-04-05 DIAGNOSIS — K592 Neurogenic bowel, not elsewhere classified: Principal | ICD-10-CM

## 2024-04-05 DIAGNOSIS — G35 Multiple sclerosis: Principal | ICD-10-CM

## 2024-04-05 DIAGNOSIS — R600 Localized edema: Principal | ICD-10-CM

## 2024-04-05 DIAGNOSIS — M792 Neuralgia and neuritis, unspecified: Principal | ICD-10-CM

## 2024-04-05 DIAGNOSIS — Z8639 Personal history of other endocrine, nutritional and metabolic disease: Principal | ICD-10-CM

## 2024-04-05 MED ORDER — DULOXETINE 60 MG CAPSULE,DELAYED RELEASE
ORAL_CAPSULE | Freq: Every day | ORAL | 0 refills | 30.00000 days | Status: CP
Start: 2024-04-05 — End: 2024-05-05

## 2024-04-10 DIAGNOSIS — G35 Multiple sclerosis: Principal | ICD-10-CM

## 2024-04-10 MED ORDER — GABAPENTIN 100 MG CAPSULE
ORAL_CAPSULE | ORAL | 3 refills | 0.00000 days | Status: CP
Start: 2024-04-10 — End: ?

## 2024-04-10 MED ORDER — BACLOFEN 20 MG TABLET
ORAL_TABLET | Freq: Four times a day (QID) | ORAL | 3 refills | 60.00000 days | Status: CP
Start: 2024-04-10 — End: ?

## 2024-04-10 MED ORDER — GABAPENTIN 800 MG TABLET
ORAL_TABLET | Freq: Every evening | ORAL | 3 refills | 90.00000 days | Status: CP
Start: 2024-04-10 — End: ?

## 2024-04-10 MED ORDER — AMANTADINE HCL 100 MG CAPSULE
ORAL_CAPSULE | Freq: Two times a day (BID) | ORAL | 3 refills | 90.00000 days | Status: CP
Start: 2024-04-10 — End: ?

## 2024-04-16 DIAGNOSIS — R252 Cramp and spasm: Principal | ICD-10-CM

## 2024-04-16 DIAGNOSIS — G35 Multiple sclerosis: Principal | ICD-10-CM

## 2024-04-21 ENCOUNTER — Inpatient Hospital Stay
Admission: EM | Admit: 2024-04-21 | Discharge: 2024-04-25 | DRG: 698 | Disposition: A | Discharge status: Home / Self Care | Attending: Internal Medicine | Admitting: Internal Medicine

## 2024-04-21 ENCOUNTER — Inpatient Hospital Stay

## 2024-04-21 ENCOUNTER — Other Ambulatory Visit: Payer: Self-pay

## 2024-04-21 ENCOUNTER — Emergency Department

## 2024-04-21 DIAGNOSIS — I110 Hypertensive heart disease with heart failure: Secondary | ICD-10-CM | POA: Diagnosis present

## 2024-04-21 DIAGNOSIS — N390 Urinary tract infection, site not specified: Secondary | ICD-10-CM | POA: Diagnosis present

## 2024-04-21 DIAGNOSIS — G8194 Hemiplegia, unspecified affecting left nondominant side: Secondary | ICD-10-CM | POA: Diagnosis present

## 2024-04-21 DIAGNOSIS — M62838 Other muscle spasm: Secondary | ICD-10-CM | POA: Diagnosis present

## 2024-04-21 DIAGNOSIS — Z1152 Encounter for screening for COVID-19: Secondary | ICD-10-CM

## 2024-04-21 DIAGNOSIS — Z87440 Personal history of urinary (tract) infections: Secondary | ICD-10-CM | POA: Diagnosis not present

## 2024-04-21 DIAGNOSIS — G8220 Paraplegia, unspecified: Secondary | ICD-10-CM | POA: Diagnosis present

## 2024-04-21 DIAGNOSIS — Z7401 Bed confinement status: Secondary | ICD-10-CM

## 2024-04-21 DIAGNOSIS — G9341 Metabolic encephalopathy: Secondary | ICD-10-CM | POA: Diagnosis present

## 2024-04-21 DIAGNOSIS — Z79899 Other long term (current) drug therapy: Secondary | ICD-10-CM

## 2024-04-21 DIAGNOSIS — I5032 Chronic diastolic (congestive) heart failure: Secondary | ICD-10-CM | POA: Diagnosis present

## 2024-04-21 DIAGNOSIS — B965 Pseudomonas (aeruginosa) (mallei) (pseudomallei) as the cause of diseases classified elsewhere: Secondary | ICD-10-CM | POA: Diagnosis present

## 2024-04-21 DIAGNOSIS — Z993 Dependence on wheelchair: Secondary | ICD-10-CM | POA: Diagnosis not present

## 2024-04-21 DIAGNOSIS — G35 Multiple sclerosis: Secondary | ICD-10-CM | POA: Diagnosis present

## 2024-04-21 DIAGNOSIS — R4182 Altered mental status, unspecified: Secondary | ICD-10-CM | POA: Diagnosis present

## 2024-04-21 DIAGNOSIS — N000 Acute nephritic syndrome with minor glomerular abnormality: Secondary | ICD-10-CM | POA: Diagnosis not present

## 2024-04-21 DIAGNOSIS — Z8249 Family history of ischemic heart disease and other diseases of the circulatory system: Secondary | ICD-10-CM | POA: Diagnosis not present

## 2024-04-21 DIAGNOSIS — N319 Neuromuscular dysfunction of bladder, unspecified: Secondary | ICD-10-CM | POA: Diagnosis present

## 2024-04-21 DIAGNOSIS — N3000 Acute cystitis without hematuria: Secondary | ICD-10-CM

## 2024-04-21 DIAGNOSIS — T83598A Infection and inflammatory reaction due to other prosthetic device, implant and graft in urinary system, initial encounter: Principal | ICD-10-CM | POA: Diagnosis present

## 2024-04-21 DIAGNOSIS — A419 Sepsis, unspecified organism: Secondary | ICD-10-CM | POA: Diagnosis present

## 2024-04-21 DIAGNOSIS — R319 Hematuria, unspecified: Secondary | ICD-10-CM | POA: Diagnosis present

## 2024-04-21 DIAGNOSIS — N3001 Acute cystitis with hematuria: Secondary | ICD-10-CM | POA: Diagnosis not present

## 2024-04-21 LAB — COMPREHENSIVE METABOLIC PANEL WITH GFR
ALT: 23 U/L (ref 0–44)
AST: 19 U/L (ref 15–41)
Albumin: 3.9 g/dL (ref 3.5–5.0)
Alkaline Phosphatase: 75 U/L (ref 38–126)
Anion gap: 13 (ref 5–15)
BUN: 13 mg/dL (ref 6–20)
CO2: 23 mmol/L (ref 22–32)
Calcium: 9.9 mg/dL (ref 8.9–10.3)
Chloride: 101 mmol/L (ref 98–111)
Creatinine, Ser: 0.55 mg/dL (ref 0.44–1.00)
GFR, Estimated: >60 mL/min
Glucose, Bld: 108 mg/dL — ABNORMAL HIGH (ref 70–99)
Potassium: 3.7 mmol/L (ref 3.5–5.1)
Sodium: 137 mmol/L (ref 135–145)
Total Bilirubin: 0.8 mg/dL (ref 0.0–1.2)
Total Protein: 9.5 g/dL — ABNORMAL HIGH (ref 6.5–8.1)

## 2024-04-21 LAB — CBC WITH DIFFERENTIAL/PLATELET
Abs Immature Granulocytes: 0.05 K/uL (ref 0.00–0.07)
Basophils Absolute: 0.1 K/uL (ref 0.0–0.1)
Basophils Relative: 0 %
Eosinophils Absolute: 0.4 K/uL (ref 0.0–0.5)
Eosinophils Relative: 3 %
HCT: 39.7 % (ref 36.0–46.0)
Hemoglobin: 13.4 g/dL (ref 12.0–15.0)
Immature Granulocytes: 0 %
Lymphocytes Relative: 18 %
Lymphs Abs: 2.2 K/uL (ref 0.7–4.0)
MCH: 30 pg (ref 26.0–34.0)
MCHC: 33.8 g/dL (ref 30.0–36.0)
MCV: 89 fL (ref 80.0–100.0)
Monocytes Absolute: 2.1 K/uL — ABNORMAL HIGH (ref 0.1–1.0)
Monocytes Relative: 17 %
Neutro Abs: 7.6 K/uL (ref 1.7–7.7)
Neutrophils Relative %: 62 %
Platelets: 366 K/uL (ref 150–400)
RBC: 4.46 MIL/uL (ref 3.87–5.11)
RDW: 14.9 % (ref 11.5–15.5)
WBC: 12.4 K/uL — ABNORMAL HIGH (ref 4.0–10.5)
nRBC: 0 % (ref 0.0–0.2)

## 2024-04-21 LAB — RESP PANEL BY RT-PCR (RSV, FLU A&B, COVID)  RVPGX2
Influenza A by PCR: NEGATIVE
Influenza B by PCR: NEGATIVE
Resp Syncytial Virus by PCR: NEGATIVE
SARS Coronavirus 2 by RT PCR: NEGATIVE

## 2024-04-21 LAB — URINALYSIS, W/ REFLEX TO CULTURE (INFECTION SUSPECTED)
Bilirubin Urine: NEGATIVE
Glucose, UA: NEGATIVE mg/dL
Ketones, ur: NEGATIVE mg/dL
Nitrite: POSITIVE — AB
Protein, ur: 100 mg/dL — AB
RBC / HPF: >50 RBC/hpf (ref 0–5)
Specific Gravity, Urine: 1.012 (ref 1.005–1.030)
Squamous Epithelial / HPF: 0 /HPF (ref 0–5)
WBC, UA: >50 WBC/hpf (ref 0–5)
pH: 5 (ref 5.0–8.0)

## 2024-04-21 LAB — LACTIC ACID, PLASMA: Lactic Acid, Venous: 1 mmol/L (ref 0.5–1.9)

## 2024-04-21 LAB — PROTIME-INR
INR: 1.1 (ref 0.8–1.2)
Prothrombin Time: 14.7 s (ref 11.4–15.2)

## 2024-04-21 MED ORDER — GABAPENTIN 400 MG PO CAPS
800.0000 mg | ORAL_CAPSULE | Freq: Every day | ORAL | Status: DC
  Administered 2024-04-21 – 2024-04-24 (×4): 800 mg via ORAL
  Filled 2024-04-21 (×4): qty 2

## 2024-04-21 MED ORDER — LACTULOSE 10 GM/15ML PO SOLN
10.0000 g | Freq: Every day | ORAL | Status: DC
  Administered 2024-04-21 – 2024-04-22 (×2): 10 g via ORAL
  Filled 2024-04-21 (×2): qty 30

## 2024-04-21 MED ORDER — ENOXAPARIN SODIUM 40 MG/0.4ML IJ SOSY
40.0000 mg | PREFILLED_SYRINGE | INTRAMUSCULAR | Status: DC
  Administered 2024-04-21 – 2024-04-24 (×4): 40 mg via SUBCUTANEOUS
  Filled 2024-04-21 (×4): qty 0.4

## 2024-04-21 MED ORDER — ACETAMINOPHEN 500 MG PO TABS
500.0000 mg | ORAL_TABLET | Freq: Four times a day (QID) | ORAL | Status: DC | PRN
  Administered 2024-04-22 – 2024-04-23 (×5): 500 mg via ORAL
  Filled 2024-04-21 (×5): qty 1

## 2024-04-21 MED ORDER — FOAM DRESSING CIRCULAR BORDER EX PADS: 1.0000 | MEDICATED_PAD | CUTANEOUS | Status: DC

## 2024-04-21 MED ORDER — LACTATED RINGERS IV SOLN: INTRAVENOUS | Status: DC

## 2024-04-21 MED ORDER — SODIUM CHLORIDE 0.9 % IV SOLN
1.0000 g | INTRAVENOUS | Status: DC
  Administered 2024-04-22: 1 g via INTRAVENOUS
  Filled 2024-04-21 (×2): qty 10

## 2024-04-21 MED ORDER — GADOBUTROL 1 MMOL/ML IV SOLN
9.0000 mL | Freq: Once | INTRAVENOUS | Status: AC | PRN
  Administered 2024-04-21: 9 mL via INTRAVENOUS

## 2024-04-21 MED ORDER — GABAPENTIN 100 MG PO CAPS
200.0000 mg | ORAL_CAPSULE | Freq: Two times a day (BID) | ORAL | Status: DC
  Administered 2024-04-21 – 2024-04-25 (×8): 200 mg via ORAL
  Filled 2024-04-21 (×8): qty 2

## 2024-04-21 MED ORDER — ONDANSETRON 4 MG PO TBDP: 4.0000 mg | ORAL_TABLET | Freq: Three times a day (TID) | ORAL | Status: DC | PRN

## 2024-04-21 MED ORDER — SODIUM CHLORIDE 0.9 % IV SOLN
1.0000 g | INTRAVENOUS | Status: AC
  Administered 2024-04-21: 1 g via INTRAVENOUS
  Filled 2024-04-21: qty 10

## 2024-04-21 MED ORDER — ACETAMINOPHEN 10 MG/ML IV SOLN
1000.0000 mg | INTRAVENOUS | Status: AC
  Administered 2024-04-21: 1000 mg via INTRAVENOUS
  Filled 2024-04-21: qty 100

## 2024-04-21 MED ORDER — AMANTADINE HCL 100 MG PO CAPS
200.0000 mg | ORAL_CAPSULE | Freq: Two times a day (BID) | ORAL | Status: DC
  Administered 2024-04-21 – 2024-04-25 (×8): 200 mg via ORAL
  Filled 2024-04-21 (×8): qty 2

## 2024-04-21 MED ORDER — PANTOPRAZOLE SODIUM 40 MG PO TBEC
40.0000 mg | DELAYED_RELEASE_TABLET | Freq: Every day | ORAL | Status: DC
  Administered 2024-04-22 – 2024-04-25 (×4): 40 mg via ORAL
  Filled 2024-04-21 (×4): qty 1

## 2024-04-21 MED ORDER — AQUACEL AG FOAM 6"X6" EX PADS: 1.0000 | MEDICATED_PAD | Freq: Every day | CUTANEOUS | Status: DC

## 2024-04-21 MED ORDER — SENNA 8.6 MG PO TABS
2.0000 | ORAL_TABLET | Freq: Two times a day (BID) | ORAL | Status: DC
  Administered 2024-04-21 – 2024-04-25 (×7): 17.2 mg via ORAL
  Filled 2024-04-21 (×7): qty 2

## 2024-04-21 MED ORDER — BISACODYL 5 MG PO TBEC: 5.0000 mg | DELAYED_RELEASE_TABLET | Freq: Every day | ORAL | Status: DC | PRN

## 2024-04-21 MED ORDER — BACLOFEN 10 MG PO TABS
20.0000 mg | ORAL_TABLET | Freq: Four times a day (QID) | ORAL | Status: DC
Start: 2024-04-21 — End: 2024-04-25
  Administered 2024-04-21 – 2024-04-25 (×15): 20 mg via ORAL
  Filled 2024-04-21 (×15): qty 2

## 2024-04-21 NOTE — ED Notes (Signed)
 No wounds on back. Rectal temp 100.8. Stage 1 pressure injury seen to R great toe.

## 2024-04-21 NOTE — Consult Note (Signed)
 CODE SEPSIS - PHARMACY COMMUNICATION  **Broad Spectrum Antibiotics should be administered within 1 hour of Sepsis diagnosis**  Time Code Sepsis Called/Page Received: 1009  Antibiotics Ordered: ceftriaxone   Time of 1st antibiotic administration: 1015  Additional action taken by pharmacy: none  If necessary, Name of Provider/Nurse Contacted: none   Kirt Chew Rodriguez-Guzman PharmD, BCPS 04/21/2024 10:42 AM

## 2024-04-21 NOTE — Progress Notes (Deleted)
 When writer rounded on patient, patient c/o of right arm weakness. When patient arrived to unit patient was able to move her right arm and hand. On assessment patient right side appears weaker than her baseline when she was transferred to unit. Vitals obtained. Provider made aware of changes. Code stroke called. Patient taken down to CT. Neurologist recommended MRI of head and cervical spine. Patient in room comfortable awaiting transport to MRI.

## 2024-04-21 NOTE — Progress Notes (Addendum)
 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, presented with 3 days of AMS described as mild confusion   Last clearly seen using the RUE at 3 PM, but last known well is well out of the window given several days of progressive symptoms -- therefore not a candidate for TNK  Able to follow simple commands, delayed responses to questions (does not answer age, states month is August)  Face symmetric, EOMI by camera, orienting to stimuli bilaterally   Exam not consistent with LVO   More likely to have MS flare (even with primary progressive MS at times flares can be seen) than to have a stroke, hold off on stroke workup for now  MRI brain and MRI C-spine w and w/o contrast ordered  Routine EEG ordered  Full neurological consultation to follow tomorrow  04/10/2024 neurology note reviewed    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 She was previously managed with ocrevus but has since had DMT discontinued given high risk for urosepsis with frequent UTI in the setting of indwelling foley catheter. Since last seen patient and family report a gradual decline in memory with interval worsening of LUE spasticity that causes pain and limited function. Notably, patient denies any flare like episodes. It seems reasonable to continue of DMT as disease does not seem to be active, which is to say that there is no clinical evidence of ongoing inflammation. Rather it seems that this patient is suffering from progression of disease, which can only be treated with Ocrevus. With that being said, risk benefit remains in favor of continuing off DMT given that restarting B cell depleting therapy and in particular Ocrevus will likely effect further infection, possibly with severe to fatal consequences.   - Patient will remain off of DMT. ^For now, hold on yearly  MRI because the results will not change the treatment plan.  **Vitamin D : -Continue Vitamin D3 4,000 units daily, over the counter .  **Neuropathic symptoms: -Increase Gabapenting to 200 mg in the morning, 400 mg at noon, 400 mg at 4pm, 800 mg at night.  **Fatigue: - Conitnue Ammantadine 200 mg twice a day  **Muscle spasms: - Increase Baclofen  to 30 mg QID - Message PM&R regarding possible botox injections at left elbow.   **Follow up 6 months.  Plan summary:  - no labs or imaging given that patient is electing to hold off on DMT - symptomatic care with  Increase Baclofen  to 30 mg QID Increase Gabapenting to 200 mg in the morning, 400 mg at noon, 400 mg at 4pm, 800 mg at night. Conitnue Ammantadine 200 mg twice a day   Neurological Examination:   General: Alert and oriented to person, place, time and situation.  Recent and remote memory intact for the most part. She has to rely on niece for recall of meds. Attention span and concentration normal.  Language and spontaneous speech was mostly normal. There were some word finding difficulties throughout exam Fund of knowledge normal. Following lateralizing commands across midline   Cranial Nerves:   II, III- Pupils are equal and reactive to light b/l (direct and consensual reactions). Visual Acuity: deferred Glasses:yes. No visual field defect. Fundoscopy: clear optic disc margins, no optic disc pallor.  III, IV, VI- extra ocular movements are intact, No ptosis, no diplopia, no nystagmus. V- sensation of the face intact b/l.  VII-  face symmetrical, no facial droop, normal facial movements with smile/grimace VIII- Hearing grossly intact.  IX and X- symmetric palate contraction. XI- Full shoulder shrug bilaterally; no wasting, normal tone and strength of sternocleidomastoid muscles bilaterally. XII- No tongue atrophy, no tongue fasciculations; tongue protrudes midline, full range of movements of the tongue.  Neck flexion  normal.  Motor Exam:   Decreased bulk. Increased tone (spastic) in the LUE at the elbow  Muscle strength:  Muscles UEs LEs  R L R L  Deltoids 4-/5 4-/5 Hip flexors 0/5 0/5  Biceps 4-/5 4-/5 Hip extensors 0/5 0/5  Triceps 4-/5 4-/5 Knee flexors 0/5 0/5  Hand grip 4-/5 4-/5 Knee extensors 0/5 0/5  Wrist flexors 4-/5 4-/5 Foot dorsal flexors 0/5 0/5  Wrist extensors 4-/5 4-/5 Foot plantar flexors 0/5 0/5  Finger flexors 4-/5 4-/5  Finger extensors 4-/5 4-/5   McArdle's sign: deferred  Reflexes R L  Biceps +2 +2  Brachioradialis +2 +2  Triceps +2 +2  Patella +0 +0  Achilles +0 +0   Plantar response: mute bilaterally  Sensory UEs LEs  R L R L  Light touch performed by patient WNL WNL WNL WNL    Cerebellar/Coordination: deferred  Gait: UTA, wheelchair bound.

## 2024-04-21 NOTE — Significant Event (Signed)
 Rapid Response Event Note   Reason for Call : called code stroke for pt with increasing right side weakness in arm.   Initial Focused Assessment: laying in bed, eyes open, following commands that she's able to (hx of MS)... see flowsheets for VS      Interventions: Dr Laurita to bedside, Stat head CT... Dr Jerrie neuro evaluating on tele- neuro cam. Not a TNK candidate, will get MRI.   Plan of Care: as above, RN [place] to call for further assistance.    Event Summary: as above  MD Notified: SHAUNNA Laurita [id number] Call Time:1814 Arrival Time:1816 End Upfz:[id number]  Nan Maya A, RN

## 2024-04-21 NOTE — ED Triage Notes (Addendum)
 to home [facility] for AMS since 3 days, getting worse, has baseline mild AMS, hx MS and bedbound  has urostomy to RLQ, urine is very cloudy  hx UTI  EMS VS: 160/80, 98% RA, 80HR, CBG 94, 100.9 axillary temp  No EMS IV, pt usually has to get U/S IV

## 2024-04-21 NOTE — Progress Notes (Addendum)
   04/21/24 1815  Spiritual Encounters  Type of Visit Attempt (pt unavailable)  Care provided to: Pt not available  Conversation partners present during encounter Nurse  Referral source Code page  Reason for visit Code  OnCall Visit Yes   Chaplain went to patient's room to respond to a Code Stroke.  Chaplain found the patient being cared for by staff and then was being moved to CT.  No family present.    Rev. Rana M. Nicholaus, M.Div. Chaplain Resident [facility]

## 2024-04-21 NOTE — Progress Notes (Signed)
 Nurse reported that the patient appeared to have new onset of right hand and right arm weakness which is new compared to this afternoon.  I went to see the patient at bedside, no facial droop send no tongue deviation.  Checked her muscle strength, right arm and right hand 3/5.  Discussed with on-call neurology.  Neurology recommended that given that patient has a poor baseline functionality with disabled left side from MS, makes her a poor candidate for TNK treatment.  Stat CT ordered and neurology ordered recommended brain MRI and cervical spine MRI with and without contrast.

## 2024-04-21 NOTE — Plan of Care (Signed)
   Problem: Education: Goal: Knowledge of General Education information will improve Description: Including pain rating scale, medication(s)/side effects and non-pharmacologic comfort measures Outcome: Progressing   Problem: Clinical Measurements: Goal: Ability to maintain clinical measurements within normal limits will improve Outcome: Progressing Goal: Respiratory complications will improve Outcome: Progressing Goal: Cardiovascular complication will be avoided Outcome: Progressing   Problem: Activity: Goal: Risk for activity intolerance will decrease Outcome: Progressing   Problem: Nutrition: Goal: Adequate nutrition will be maintained Outcome: Progressing

## 2024-04-21 NOTE — H&P (Addendum)
 History and Physical    [name] FMW:[id number] DOB: September 24, 1971 DOA: 04/21/2024  PCP: Eino Tinnie BIRCH, MD (Confirm with patient/family/NH records and if not entered, this has to be entered at [facility] point of entry) Patient coming from: Home  I have personally briefly reviewed patient's old medical records in [facility]  Chief Complaint: Fever, confusion  HPI: [name] is a 53 y.o. female with medical history significant of multiple sclerosis, complicated by left-sided weakness neurological bladder status post ileal conduit, HTN chronic HFpEF, neuropathic pain, anxiety suppression, history of recurrent MDR UTI brought in by family member for evaluation of confusion and fever.  Patient started to have mild confusion for last 3 days, family also noticed cloudy urine and patient complained about episode of subjective fever and chills.  Denied any abdominal pain no chest pain no nausea vomiting or diarrhea. ED Course: Temperature 100.8, pulse rate 72 blood pressure 160/57 O2 saturation 98% on room air.  Chest x-rays were negative for acute findings UA showed 3+ WBCs 3+ RBCs.-*Blood work showed WBC 12.  Patient started on ceftriaxone .  Review of Systems: As per HPI otherwise 14 point review of systems negative.    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 She has never used smokeless tobacco. She reports that she does not currently use alcohol. She reports that she does not  use drugs.  Allergies  Allergen Reactions   Latex Swelling and Other (See Comments)    Reaction:  Facial swelling    Vancomycin  Itching    Family History  Problem Relation Age of Onset   Osteoarthritis Mother    Hypertension Mother    Hyperlipidemia Mother    Thyroid disease Mother    Liver cancer Father    Asthma Brother     Prior to Admission medications   Medication Sig Start Date End Date Taking? Authorizing Provider  acetaminophen  (TYLENOL ) 500 MG tablet Take 500 mg by mouth every 6 (six) hours as needed.    [provider]  amantadine  (SYMMETREL ) 100 MG capsule Take 200 mg by mouth 2 (two) times daily.    [provider]  baclofen  (LIORESAL ) 20 MG tablet Take 20 mg by mouth 4 (four) times daily. 08/31/23   [provider]  bisacodyl  (DULCOLAX) 5 MG EC tablet Take 1 tablet (5 mg total) by mouth daily as needed for moderate constipation. 03/18/24 03/18/25  Willo Dunnings, MD  gabapentin  (NEURONTIN ) 100 MG capsule Take 200 mg by mouth in the morning, at noon, and at bedtime. 06/17/21   [provider]  gabapentin  (NEURONTIN ) 800 MG tablet Take 800 mg by mouth at bedtime. 07/08/21   [provider]  lactulose  (CHRONULAC ) 10 GM/15ML solution Take 10 g by mouth daily. 01/25/24   [provider]  ondansetron  (ZOFRAN -ODT) 4 MG disintegrating tablet Take 1 tablet (4 mg total) by mouth every 8 (eight) hours as needed. 03/18/24   Claudene Rover, MD  pantoprazole  (PROTONIX ) 40 MG  tablet Take 1 tablet by mouth daily. 10/02/23   [provider]  senna (SENOKOT) 8.6 MG tablet Take 2 tablets by mouth 2 (two) times daily. 06/24/22   [provider]  Silver (AQUACEL AG FOAM) [phone] PADS Apply 1 each topically daily in the afternoon. 03/16/23   Wouk, Devaughn Sayres, MD  sulfamethoxazole -trimethoprim  (BACTRIM ) 400-80 MG tablet Take 0.5 tablets by mouth daily. 03/25/24 03/25/25  Von Bellis, MD  Wound Dressings (FOAM DRESSING CIRCULAR BORDER)  PADS Apply 1 each topically every 3 (three) days. 03/16/23   Kandis Devaughn Sayres, MD    Physical Exam: Vitals:   04/21/24 0949 04/21/24 0959 04/21/24 1100 04/21/24 1115  BP: (!) 152/73  (!) 165/57   Pulse: 82   72  Resp: 20   18  Temp: 100.3 F (37.9 C) (!) 100.8 F (38.2 C)    TempSrc: Oral Rectal    SpO2: 100%   99%  Weight:      Height:        Constitutional: NAD, calm, comfortable Vitals:   04/21/24 0949 04/21/24 0959 04/21/24 1100 04/21/24 1115  BP: (!) 152/73  (!) 165/57   Pulse: 82   72  Resp: 20   18  Temp: 100.3 F (37.9 C) (!) 100.8 F (38.2 C)    TempSrc: Oral Rectal    SpO2: 100%   99%  Weight:      Height:       Eyes: PERRL, lids and conjunctivae normal ENMT: Mucous membranes are moist. Posterior pharynx clear of any exudate or lesions.Normal dentition.  Neck: normal, supple, no masses, no thyromegaly Respiratory: clear to auscultation bilaterally, no wheezing, no crackles. Normal respiratory effort. No accessory muscle use.  Cardiovascular: Regular rate and rhythm, no murmurs / rubs / gallops. No extremity edema. 2+ pedal pulses. No carotid bruits.  Abdomen: no tenderness, no masses palpated. No hepatosplenomegaly. Bowel sounds positive.  Musculoskeletal: no clubbing / cyanosis. No joint deformity upper and lower extremities. Good ROM, no contractures. Normal muscle tone.  Skin: no rashes, lesions, ulcers. No induration Neurologic: CN 2-12 grossly intact. Sensation intact, DTR normal. Strength 5/5 in all 4.  Psychiatric: Awake, oriented to person and place, confused to time    Labs on Admission: I have personally reviewed following labs and imaging studies  CBC: Recent Labs  Lab 04/21/24 0942  WBC 12.4*  NEUTROABS 7.6  HGB 13.4  HCT 39.7  MCV 89.0  PLT 366   Basic Metabolic Panel: Recent Labs  Lab 04/21/24 0942  NA 137  K 3.7  CL 101  CO2 23  GLUCOSE 108*  BUN 13  CREATININE 0.55  CALCIUM 9.9   GFR: Estimated Creatinine Clearance:  86.2 mL/min (by C-G formula based on SCr of 0.55 mg/dL). Liver Function Tests: Recent Labs  Lab 04/21/24 0942  AST 19  ALT 23  ALKPHOS 75  BILITOT 0.8  PROT 9.5*  ALBUMIN 3.9   No results for input(s): LIPASE, AMYLASE in the last 168 hours. No results for input(s): AMMONIA in the last 168 hours. Coagulation Profile: Recent Labs  Lab 04/21/24 0942  INR 1.1   Cardiac Enzymes: No results for input(s): CKTOTAL, CKMB, CKMBINDEX, TROPONINI in the last 168 hours. BNP (last 3 results) No results for input(s): PROBNP in the last 8760 hours. HbA1C: No results for input(s): HGBA1C in the last 72 hours. CBG: No results for input(s): GLUCAP in the last 168 hours. Lipid Profile: No results for input(s): CHOL, HDL, LDLCALC, TRIG, CHOLHDL, LDLDIRECT in the  last 72 hours. Thyroid Function Tests: No results for input(s): TSH, T4TOTAL, FREET4, T3FREE, THYROIDAB in the last 72 hours. Anemia Panel: No results for input(s): VITAMINB12, FOLATE, FERRITIN, TIBC, IRON, RETICCTPCT in the last 72 hours. Urine analysis:    Component Value Date/Time   COLORURINE YELLOW (A) 04/21/2024 0942   APPEARANCEUR TURBID (A) 04/21/2024 0942   APPEARANCEUR Cloudy 10/05/2014 2100   LABSPEC 1.012 04/21/2024 0942   LABSPEC 1.009 10/05/2014 2100   PHURINE 5.0 04/21/2024 0942   GLUCOSEU NEGATIVE 04/21/2024 0942   GLUCOSEU Negative 10/05/2014 2100   HGBUR SMALL (A) 04/21/2024 0942   BILIRUBINUR NEGATIVE 04/21/2024 0942   BILIRUBINUR Negative 10/05/2014 2100   KETONESUR NEGATIVE 04/21/2024 0942   PROTEINUR 100 (A) 04/21/2024 0942   NITRITE POSITIVE (A) 04/21/2024 0942   LEUKOCYTESUR MODERATE (A) 04/21/2024 0942   LEUKOCYTESUR 3+ 10/05/2014 2100    Radiological Exams on Admission: DG Chest Port 1 View if patient is in a treatment room. Result Date: 04/21/2024 CLINICAL DATA:  Suspected sepsis. EXAM: PORTABLE CHEST 1 VIEW COMPARISON:  Chest radiograph dated  05/02/2023. FINDINGS: Linear right lung base atelectasis/scarring. No focal consolidation, pleural effusion or pneumothorax. The cardiac silhouette is within limits. No acute osseous pathology. IMPRESSION: No active disease. Electronically Signed   By: Vanetta Chou M.D.   On: 04/21/2024 10:43    EKG: Independently reviewed.  Sinus arrhythmia no acute ST changes (computer reported as A-fib however there are discernible P waves before each QRS)  Assessment/Plan Principal Problem:   UTI (urinary tract infection) Active Problems:   Acute metabolic encephalopathy  (please populate well all problems here in Problem List. (For example, if patient is on BP meds at home and you resume or decide to hold them, it is a problem that needs to be her. Same for CAD, COPD, HLD and so on)  Sepsis with acute organ damage UTI with hematuria - Sepsis as evidenced by fever, leukocytosis, source of infection likely recurrent UTI. - Given that patient appeared to be euvolemic and lactic acid level normal and blood pressure within normal limits, no IV bolus given - Continue ceftriaxone , review of patient most recent urine culture showed mixed flora in June and MDR Klebsiella in March.  Last episode of UTI in June-July was successfully treated with ceftriaxone  followed by p.o. Bactrim . - Given that this is third episode of UTI this year, consider outpatient suppressive antibiotic treatment, defer to PCP.  Multiple sclerosis Paraplegic Chronic left arm extremity weakness Neurological bladder Neurogenic bowel - Chronic wheelchair-bound  HTN Chronic HFpEF -Euvolemic, blood pressure stable  Total time spent on patient care 55 minutes.  DVT prophylaxis: Lovenox  Code Status: Full code Family Communication: None at bedside Disposition Plan: Patient sick with sepsis and high risk of MDR UTI, requiring IV antibiotics, expect more than 2 midnight hospital stay Consults called: None Admission status: Telemetry  admission   Cort ONEIDA Mana MD [facility] Pager [phone]  04/21/2024, 12:03 PM

## 2024-04-21 NOTE — Progress Notes (Signed)
 When writer rounded on patient, patient c/o of right arm weakness. When patient arrived to unit patient was able to move her right arm and hand. On assessment patient right side appears weaker than her baseline when she was transferred to unit. Vitals obtained. Provider made aware of changes. Code stroke called. Patient taken down to CT. Neurologist recommended MRI of head and cervical spine. Patient in room comfortable awaiting transport to MRI.

## 2024-04-21 NOTE — ED Provider Notes (Signed)
 [facility] Provider Note   Event Date/Time   First MD Initiated Contact with Patient 04/21/24 [phone]     (approximate)  History   Altered Mental Status  HPI  [name] is a 53 y.o. female  Multiple sclerosis,complicated by left-sided weakness, neurogenic bladder s/p ileal conduit, diastolic CHF, neuropathic pain, depression and who has history of recurrent MDR UTI      Patient reports that she is not feeling well, she is not exactly sure what the problem is but she has been having fever feels achiness she feels achiness around her neck  She also has a urinary catheter.  She has been having fever body ache.  She also reports she just does not quite feel herself.  She has chronic weakness and debility  Physical Exam   Triage Vital Signs: ED Triage Vitals [04/21/24 0940]  Encounter Vitals Group     BP      Girls Systolic BP Percentile      Girls Diastolic BP Percentile      Boys Systolic BP Percentile      Boys Diastolic BP Percentile      Pulse      Resp      Temp      Temp src      SpO2      Weight 193 lb 9 oz (87.8 kg)     Height 5' 4 (1.626 m)     Head Circumference      Peak Flow      Pain Score      Pain Loc      Pain Education      Exclude from Growth Chart     Most recent vital signs: Vitals:   04/21/24 1100 04/21/24 1115  BP: (!) 165/57   Pulse:  72  Resp:  18  Temp:    SpO2:  99%     General: Awake, no distress.  She is very pleasant, alert, oriented to self but seems slightly slowed and cognition.  No Photophobia No meningismus.  Patient reports some soreness to palpation of the neck primarily on the right paraspinal spinous region, but reports improvement with repositioning pillow. CV:  Good peripheral perfusion.  Normal tones and rate Resp:  Normal effort.  Clear lungs normal work of breathing Abd:  No distention.  Soft nontender nondistended.  Urostomy, catheter draining very cloudy, straw-colored urine with  sediment Other:  Lower extremities, small sore over the great toe of the right foot and second toe but no necrotic lesion or obvious purulent drainage   ED Results / Procedures / Treatments   Labs (all labs ordered are listed, but only abnormal results are displayed) Labs Reviewed  COMPREHENSIVE METABOLIC PANEL WITH GFR - Abnormal; Notable for the following components:      Result Value   Glucose, Bld 108 (*)    Total Protein 9.5 (*)    All other components within normal limits  CBC WITH DIFFERENTIAL/PLATELET - Abnormal; Notable for the following components:   WBC 12.4 (*)    Monocytes Absolute 2.1 (*)    All other components within normal limits  URINALYSIS, W/ REFLEX TO CULTURE (INFECTION SUSPECTED) - Abnormal; Notable for the following components:   Color, Urine YELLOW (*)    APPearance TURBID (*)    Hgb urine dipstick SMALL (*)    Protein, ur 100 (*)    Nitrite POSITIVE (*)    Leukocytes,Ua MODERATE (*)    Bacteria, UA MANY (*)  All other components within normal limits  RESP PANEL BY RT-PCR (RSV, FLU A&B, COVID)  [id number]  CULTURE, BLOOD (ROUTINE X 2)  CULTURE, BLOOD (ROUTINE X 2)  URINE CULTURE  LACTIC ACID, PLASMA  PROTIME-INR   Urine culture sent.  Urinalysis concerning for potential infection.    RADIOLOGY  Chest x-ray interpreted by me is negative for acute finding.      PROCEDURES:  Critical Care performed: No  Procedures   MEDICATIONS ORDERED IN ED: Medications  lactated ringers  infusion ( Intravenous New Bag/Given 04/21/24 1017)  acetaminophen  (TYLENOL ) tablet 500 mg (has no administration in time range)  bisacodyl  (DULCOLAX) EC tablet 5 mg (has no administration in time range)  lactulose  (CHRONULAC ) 10 GM/15ML solution 10 g (has no administration in time range)  ondansetron  (ZOFRAN -ODT) disintegrating tablet 4 mg (has no administration in time range)  pantoprazole  (PROTONIX ) EC tablet 40 mg (has no administration in time range)  senna  (SENOKOT) tablet 17.2 mg (has no administration in time range)  amantadine  (SYMMETREL ) capsule 200 mg (has no administration in time range)  baclofen  (LIORESAL ) tablet 20 mg (has no administration in time range)  gabapentin  (NEURONTIN ) capsule 200 mg (has no administration in time range)  gabapentin  (NEURONTIN ) tablet 800 mg (has no administration in time range)  Aquacel Ag Foam PADS 1 each (has no administration in time range)  Foam Dressing Circular Border PADS 1 each (has no administration in time range)  enoxaparin  (LOVENOX ) injection 40 mg (has no administration in time range)  cefTRIAXone  (ROCEPHIN ) 1 g in sodium chloride  0.9 % 100 mL IVPB (0 g Intravenous Stopped 04/21/24 1119)  acetaminophen  (OFIRMEV ) IV 1,000 mg (0 mg Intravenous Stopped 04/21/24 1149)     IMPRESSION / MDM / ASSESSMENT AND PLAN / ED COURSE  I reviewed the triage vital signs and the nursing notes.                              Differential diagnosis includes, but is not limited to, infection in particular given her findings and clinical history of urinary infection, sepsis, other cause such as pneumonia, bacteremia, etc. are considered.  Patient's presentation is most consistent with acute complicated illness / injury requiring diagnostic workup.     ----------------------------------------- 12:03 PM on 04/21/2024 ----------------------------------------- Workup to this point appears to point towards likely cause being recurrent urinary infection.  I discussed with her pharmacist and reviewed previous cultures it appears that initiation of Rocephin  is most appropriate which has been started at this time.  Discussed and consulted with Dr. Laurita of the hospitalist service who will be providing admission.  Patient hemodynamics are reassuring febrile with elevated white count indicative of sepsis though no evidence of severe sepsis or septic shock at this time with exception to being a slight alteration in mental status  which appears to be improving.  Patient to be admitted for treatment of urinary tract infection     FINAL CLINICAL IMPRESSION(S) / ED DIAGNOSES   Final diagnoses:  Altered mental status, unspecified altered mental status type  Sepsis, due to unspecified organism, unspecified whether acute organ dysfunction present [facility])  Urinary tract infection, acute     Rx / DC Orders   ED Discharge Orders     None        Note:  This document was prepared using Dragon voice recognition software and may include unintentional dictation errors.   Dicky Anes, MD 04/21/24 [phone]

## 2024-04-22 ENCOUNTER — Inpatient Hospital Stay

## 2024-04-22 DIAGNOSIS — R4182 Altered mental status, unspecified: Secondary | ICD-10-CM

## 2024-04-22 DIAGNOSIS — N000 Acute nephritic syndrome with minor glomerular abnormality: Secondary | ICD-10-CM

## 2024-04-22 DIAGNOSIS — N3000 Acute cystitis without hematuria: Secondary | ICD-10-CM | POA: Diagnosis not present

## 2024-04-22 LAB — CBC
HCT: 37.8 % (ref 36.0–46.0)
Hemoglobin: 12.5 g/dL (ref 12.0–15.0)
MCH: 29.5 pg (ref 26.0–34.0)
MCHC: 33.1 g/dL (ref 30.0–36.0)
MCV: 89.2 fL (ref 80.0–100.0)
Platelets: 332 K/uL (ref 150–400)
RBC: 4.24 MIL/uL (ref 3.87–5.11)
RDW: 15 % (ref 11.5–15.5)
WBC: 13.6 K/uL — ABNORMAL HIGH (ref 4.0–10.5)
nRBC: 0 % (ref 0.0–0.2)

## 2024-04-22 MED ORDER — SMOG ENEMA
960.0000 mL | Freq: Once | RECTAL | Status: AC
  Administered 2024-04-22: 960 mL via RECTAL
  Filled 2024-04-22: qty 960

## 2024-04-22 MED ORDER — LACTULOSE 10 GM/15ML PO SOLN
10.0000 g | Freq: Two times a day (BID) | ORAL | Status: DC
  Administered 2024-04-22 – 2024-04-25 (×6): 10 g via ORAL
  Filled 2024-04-22 (×6): qty 30

## 2024-04-22 MED ORDER — LACTATED RINGERS IV SOLN: INTRAVENOUS | Status: DC

## 2024-04-22 NOTE — Progress Notes (Signed)
 PROGRESS NOTE    [name]  FMW:[id number] DOB: [birth year] DOA: 04/21/2024 PCP: Eino Tinnie BIRCH, MD    Brief Narrative:  53 y.o. female with medical history significant of multiple sclerosis, complicated by left-sided weakness neurological bladder status post ileal conduit, HTN chronic HFpEF, neuropathic pain, anxiety suppression, history of recurrent MDR UTI brought in by family member for evaluation of confusion and fever.   Patient started to have mild confusion for last 3 days, family also noticed cloudy urine and patient complained about episode of subjective fever and chills.  Denied any abdominal pain no chest pain no nausea vomiting or diarrhea.   Assessment & Plan:   Principal Problem:   UTI (urinary tract infection) Active Problems:   Acute metabolic encephalopathy  Sepsis with acute organ damage UTI with hematuria Acute metabolic encephalopathy secondary to above - Sepsis as evidenced by fever, leukocytosis, source of infection likely recurrent UTI.  Vital signs stable.  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  MRI and was pursued of cervical spine and brain.  No contrast-enhancing lesions to suggest MS flare.  No indication for formal neurology consultation.  Case discussed with Dr. Jerrie  Neurological bladder Neurogenic bowel - Chronic wheelchair-bound   HTN Chronic HFpEF -Euvolemic, blood pressure stable     DVT prophylaxis: SQ Lovenox  Code Status: Full Family Communication: Brother at bedside 7/28 Disposition Plan: Status is: Inpatient Remains inpatient appropriate because: UTI and associated delirium   Level of care: Telemetry Medical  Consultants:  None  Procedures:   None  Antimicrobials: Ceftriaxone    Subjective: Seen and examined.  Resting in bed.  Flattened affect.  Dors is abdominal pain.  Objective: Vitals:   04/21/24 1800 04/21/24 2021 04/22/24 0241 04/22/24 0754  BP: (!) 184/68 (!) 147/89 (!) 152/68 (!) 149/72  Pulse: 65 90 91 88  Resp:  18 18 17   Temp:  98.6 F (37 C) 98.8 F (37.1 C) 97.9 F (36.6 C)  TempSrc:  Oral Oral   SpO2:  100%  99%  Weight:      Height:        Intake/Output Summary (Last 24 hours) at 04/22/2024 1227 Last data filed at 04/22/2024 0500 Gross per 24 hour  Intake 615.08 ml  Output 950 ml  Net -334.92 ml   Filed Weights   04/21/24 0940  Weight: 87.8 kg    Examination:  General exam: Appears uncomfortable Respiratory system: Bibasilar crackles.  Normal work of breathing.  Room air Cardiovascular system: 1 S2, RRR, no murmurs, no pedal edema Gastrointestinal system: soft, mild TTP, normal bowel sounds Central nervous system: Alert.  Oriented.  Chronic deficits Extremities: 0 X5 bilateral lower extremities Skin: No rashes, lesions or ulcers Psychiatry: Judgement and insight appear impaired. Mood & affect flattened.     Data Reviewed: I have personally reviewed following labs and imaging studies  CBC: Recent Labs  Lab 04/21/24 0942 04/22/24 0331  WBC 12.4* 13.6*  NEUTROABS 7.6  --   HGB 13.4 12.5  HCT 39.7 37.8  MCV 89.0 89.2  PLT 366 332   Basic Metabolic Panel: Recent Labs  Lab 04/21/24 0942  NA 137  K 3.7  CL 101  CO2 23  GLUCOSE 108*  BUN 13  CREATININE 0.55  CALCIUM 9.9   GFR: Estimated Creatinine Clearance:  86.2 mL/min (by C-G formula based on SCr of 0.55 mg/dL). Liver Function Tests: Recent Labs  Lab 04/21/24 0942  AST 19  ALT 23  ALKPHOS 75  BILITOT 0.8  PROT 9.5*  ALBUMIN 3.9   No results for input(s): LIPASE, AMYLASE in the last 168 hours. No results for input(s): AMMONIA in the last 168 hours. Coagulation Profile: Recent Labs  Lab 04/21/24 0942   INR 1.1   Cardiac Enzymes: No results for input(s): CKTOTAL, CKMB, CKMBINDEX, TROPONINI in the last 168 hours. BNP (last 3 results) No results for input(s): PROBNP in the last 8760 hours. HbA1C: No results for input(s): HGBA1C in the last 72 hours. CBG: No results for input(s): GLUCAP in the last 168 hours. Lipid Profile: No results for input(s): CHOL, HDL, LDLCALC, TRIG, CHOLHDL, LDLDIRECT in the last 72 hours. Thyroid Function Tests: No results for input(s): TSH, T4TOTAL, FREET4, T3FREE, THYROIDAB in the last 72 hours. Anemia Panel: No results for input(s): VITAMINB12, FOLATE, FERRITIN, TIBC, IRON, RETICCTPCT in the last 72 hours. Sepsis Labs: Recent Labs  Lab 04/21/24 9057  LATICACIDVEN 1.0    Recent Results (from the past 240 hours)  Culture, blood (Routine x 2)     Status: None (Preliminary result)   Collection Time: 04/21/24  9:42 AM   Specimen: BLOOD  Result Value Ref Range Status   Specimen Description BLOOD BLOOD LEFT HAND  Final   Special Requests   Final    BOTTLES DRAWN AEROBIC AND ANAEROBIC Blood Culture adequate volume   Culture   Final    NO GROWTH < 24 HOURS Performed at [facility], [address] [place], [place] [address]    Report Status PENDING  Incomplete  Culture, blood (Routine x 2)     Status: None (Preliminary result)   Collection Time: 04/21/24  9:47 AM   Specimen: BLOOD  Result Value Ref Range Status   Specimen Description BLOOD BLOOD RIGHT HAND  Final   Special Requests   Final    BOTTLES DRAWN AEROBIC AND ANAEROBIC Blood Culture adequate volume   Culture   Final    NO GROWTH < 24 HOURS Performed at [facility], [address] [place], [place] [address]    Report Status PENDING  Incomplete  Resp panel by RT-PCR (RSV, Flu A&B, Covid) Anterior Nasal Swab     Status: None   Collection Time: 04/21/24 10:00 AM   Specimen: Anterior Nasal Swab  Result Value Ref Range  Status   SARS Coronavirus 2 by RT PCR NEGATIVE NEGATIVE Final    Comment: (NOTE) SARS-CoV-2 target nucleic acids are NOT DETECTED.  The SARS-CoV-2 RNA is generally detectable in upper respiratory specimens during the acute phase of infection. The lowest concentration of SARS-CoV-2 viral copies this assay can detect is 138 copies/mL. A negative result does not preclude SARS-Cov-2 infection and should not be used as the sole basis for treatment or other patient management decisions. A negative result may occur with  improper specimen collection/handling, submission of specimen other than nasopharyngeal swab, presence of viral mutation(s) within the areas targeted by this assay, and inadequate number of viral copies(<138 copies/mL). A negative result must be combined with clinical observations, patient history, and epidemiological information. The expected result is Negative.  Fact Sheet for Patients:  BloggerCourse.com  Fact Sheet for Healthcare Providers:  SeriousBroker.it  This test is no t yet approved or cleared by the [place]  FDA and  has been authorized for detection and/or diagnosis of SARS-CoV-2 by  FDA under an Emergency Use Authorization (EUA). This EUA will remain  in effect (meaning this test can be used) for the duration of the COVID-19 declaration under Section 564(b)(1) of the Act, 21 U.S.C.section 360bbb-3(b)(1), unless the authorization is terminated  or revoked sooner.       Influenza A by PCR NEGATIVE NEGATIVE Final   Influenza B by PCR NEGATIVE NEGATIVE Final    Comment: (NOTE) The Xpert Xpress SARS-CoV-2/FLU/RSV plus assay is intended as an aid in the diagnosis of influenza from Nasopharyngeal swab specimens and should not be used as a sole basis for treatment. Nasal washings and aspirates are unacceptable for Xpert Xpress SARS-CoV-2/FLU/RSV testing.  Fact Sheet for  Patients: BloggerCourse.com  Fact Sheet for Healthcare Providers: SeriousBroker.it  This test is not yet approved or cleared by the [place]  FDA and has been authorized for detection and/or diagnosis of SARS-CoV-2 by FDA under an Emergency Use Authorization (EUA). This EUA will remain in effect (meaning this test can be used) for the duration of the COVID-19 declaration under Section 564(b)(1) of the Act, 21 U.S.C. section 360bbb-3(b)(1), unless the authorization is terminated or revoked.     Resp Syncytial Virus by PCR NEGATIVE NEGATIVE Final    Comment: (NOTE) Fact Sheet for Patients: BloggerCourse.com  Fact Sheet for Healthcare Providers: SeriousBroker.it  This test is not yet approved or cleared by the [place]  FDA and has been authorized for detection and/or diagnosis of SARS-CoV-2 by FDA under an Emergency Use Authorization (EUA). This EUA will remain in effect (meaning this test can be used) for the duration of the COVID-19 declaration under Section 564(b)(1) of the Act, 21 U.S.C. section 360bbb-3(b)(1), unless the authorization is terminated or revoked.  Performed at [facility], [address] [place], [place] [address]          Radiology Studies: MR BRAIN W WO CONTRAST Addendum Date: 04/21/2024 ADDENDUM REPORT: 04/21/2024 22:10 ADDENDUM: On further review, approximately 6 mm left frontal convexity meningioma is unchanged. No mass effect. Electronically Signed   By: Gilmore GORMAN Molt M.D.   On: 04/21/2024 22:10   Result Date: 04/21/2024 CLINICAL DATA:  Demyelinating disease new right upper extremity weakness EXAM: MRI HEAD WITHOUT AND WITH CONTRAST TECHNIQUE: Multiplanar, multiecho pulse sequences of the brain and surrounding structures were obtained without and with intravenous contrast. CONTRAST:  9mL GADAVIST  GADOBUTROL  1 MMOL/ML IV SOLN  COMPARISON:  MRI head December 08, 2023. FINDINGS: Brain: Stable predominately periventricular T2/FLAIR hyperintensities the white matter. Similar subtle foci in the brainstem. No new white matter lesions or an abnormal enhancement. No evidence of acute infarct, acute hemorrhage, mass lesion or midline shift. No hydrocephalus. Vascular: Major arterial flow voids are maintained. Skull and upper cervical spine: Normal marrow signal. Sinuses/Orbits: Negative. IMPRESSION: Stable white matter disease, compatible with reported multiple sclerosis. No new lesions or abnormal enhancement. Electronically Signed: By: Gilmore GORMAN Molt M.D. On: 04/21/2024 22:01   MR CERVICAL SPINE W WO CONTRAST Result Date: 04/21/2024 CLINICAL DATA:  Demyelinating disease new RUE weakness EXAM: MRI CERVICAL SPINE WITHOUT AND WITH CONTRAST TECHNIQUE: Multiplanar and multiecho pulse sequences of the cervical spine, to include the craniocervical junction and cervicothoracic junction, were obtained without and with intravenous contrast. CONTRAST:  9mL GADAVIST  GADOBUTROL  1 MMOL/ML IV SOLN COMPARISON:  MRI cervical spine April 17, 24. FINDINGS: Alignment: No substantial sagittal subluxation. Vertebrae: No fracture, evidence of discitis, or bone lesion. Cord: No convincing cord signal abnormality. No abnormal enhancement. Posterior Fossa, vertebral arteries, paraspinal tissues: Negative. Disc levels:  C2-C3: Posterior disc osteophyte complex without significant stenosis. C3-C4: Posterior disc osteophyte complex and bilateral facet and uncovertebral hypertrophy. Similar mild right and moderate left foraminal stenosis. Mild canal stenosis. C4-C5: Posterior disc osteophyte complex with bilateral facet uncovertebral hypertrophy. Mild bilateral foraminal stenosis. No significant canal stenosis. C5-C6: Facet arthropathy without significant stenosis. Small disc bulge. C6-C7: Small posterior disc osteophyte complex without significant stenosis. C7-T1: No  significant disc protrusion, foraminal stenosis, or canal stenosis. IMPRESSION: 1. No convincing cord signal abnormality or abnormal enhancement. 2. Similar multilevel degenerative change, greatest at C3-C4 where there is moderate left and mild right foraminal stenosis and mild canal stenosis. Electronically Signed   By: Gilmore GORMAN Molt M.D.   On: 04/21/2024 22:09   CT HEAD CODE STROKE WO CONTRAST` Result Date: 04/21/2024 CLINICAL DATA:  Code stroke. Neuro deficit, acute, stroke suspected. Confusion and fever. History of multiple sclerosis. EXAM: CT HEAD WITHOUT CONTRAST TECHNIQUE: Contiguous axial images were obtained from the base of the skull through the vertex without intravenous contrast. RADIATION DOSE REDUCTION: This exam was performed according to the departmental dose-optimization program which includes automated exposure control, adjustment of the mA and/or kV according to patient size and/or use of iterative reconstruction technique. COMPARISON:  Head CT and MRI 12/08/2023 FINDINGS: Brain: There is no evidence of an acute infarct, intracranial hemorrhage, mass, midline shift, or extra-axial fluid collection. Cerebral volume is normal. The ventricles are normal in size. Known chronic demyelinating disease involving the cerebral white matter on MRI is not was shown by CT. Vascular: No hyperdense vessel. Skull: No fracture or suspicious lesion. Sinuses/Orbits: Visualized paranasal sinuses and mastoid air cells are clear. Unremarkable orbits. Other: None. ASPECTS (Alberta Stroke Program Early CT Score) - Ganglionic level infarction (caudate, lentiform nuclei, internal capsule, insula, M1-M3 cortex): 7 - Supraganglionic infarction (M4-M6 cortex): 3 Total score (0-10 with 10 being normal): 10 These results were communicated to Dr. Cort Mana at 6:37 pm on 04/21/2024 by text page via the [facility] messaging system. IMPRESSION: No evidence of acute intracranial abnormality. ASPECTS of 10. Electronically Signed    By: Dasie Hamburg M.D.   On: 04/21/2024 18:37   DG Chest Port 1 View if patient is in a treatment room. Result Date: 04/21/2024 CLINICAL DATA:  Suspected sepsis. EXAM: PORTABLE CHEST 1 VIEW COMPARISON:  Chest radiograph dated 05/02/2023. FINDINGS: Linear right lung base atelectasis/scarring. No focal consolidation, pleural effusion or pneumothorax. The cardiac silhouette is within limits. No acute osseous pathology. IMPRESSION: No active disease. Electronically Signed   By: Vanetta Chou M.D.   On: 04/21/2024 10:43        Scheduled Meds:  amantadine   200 mg Oral BID   baclofen   20 mg Oral QID   enoxaparin  (LOVENOX ) injection  40 mg Subcutaneous Q24H   gabapentin   200 mg Oral BID   gabapentin   800 mg Oral QHS   lactulose   10 g Oral Daily   pantoprazole   40 mg Oral Daily   senna  2 tablet Oral BID   Continuous Infusions:  cefTRIAXone  (ROCEPHIN )  IV 1 g (04/22/24 0901)   lactated ringers  75 mL/hr at 04/22/24 0902     LOS: 1 day      Calvin KATHEE Robson, MD [facility]   If 7PM-7AM, please contact night-coverage  04/22/2024, 12:27 PM

## 2024-04-22 NOTE — Progress Notes (Signed)
 Eeg done

## 2024-04-22 NOTE — Plan of Care (Signed)
 Discussed with Dr. Calvin, given negative MRI imaging, he is comfortable managing the patient medically at this time and will hold off on full neurological consultation   Lola Jernigan MD-PhD [facility] [phone]

## 2024-04-22 NOTE — Procedures (Addendum)
 Patient Name: [name]  MRN: [id number]  Epilepsy Attending: Pastor Falling  Referring Physician/Provider:  Date: 04/22/2024 Duration: 30 minutes   Patient history: 53 year old with altered mental status. EEG to evaluate for seizure  Level of alertness:  lethargic   AEDs during EEG study: Gabapentin   Technical aspects: This EEG study was done with scalp electrodes positioned according to the 10-20 International system of electrode placement. Electrical activity was reviewed with band pass filter of 1-70Hz , sensitivity of 7 uV/mm, display speed of 63mm/sec with a 60Hz  notched filter applied as appropriate. EEG data were recorded continuously and digitally stored.  Video monitoring was available and reviewed as appropriate.  Description: EEG showed continuous generalized slowing with low voltage and increase myogenic artifact.  Hyperventilation and photic stimulation were not performed.     ABNORMALITY - Continuous slow, generalized   IMPRESSION: This study is suggestive of mild diffuse encephalopathy, nonspecific etiology but likely related to sedation, toxic-metabolic etiology. No seizures or epileptiform discharges were seen throughout the recording.   Pastor Falling MD Neurology

## 2024-04-23 DIAGNOSIS — N3000 Acute cystitis without hematuria: Secondary | ICD-10-CM | POA: Diagnosis not present

## 2024-04-23 LAB — CBC WITH DIFFERENTIAL/PLATELET
Abs Immature Granulocytes: 0.06 K/uL (ref 0.00–0.07)
Basophils Absolute: 0 K/uL (ref 0.0–0.1)
Basophils Relative: 0 %
Eosinophils Absolute: 0.3 K/uL (ref 0.0–0.5)
Eosinophils Relative: 2 %
HCT: 33.8 % — ABNORMAL LOW (ref 36.0–46.0)
Hemoglobin: 11.4 g/dL — ABNORMAL LOW (ref 12.0–15.0)
Immature Granulocytes: 1 %
Lymphocytes Relative: 17 %
Lymphs Abs: 2 K/uL (ref 0.7–4.0)
MCH: 29.8 pg (ref 26.0–34.0)
MCHC: 33.7 g/dL (ref 30.0–36.0)
MCV: 88.3 fL (ref 80.0–100.0)
Monocytes Absolute: 2.1 K/uL — ABNORMAL HIGH (ref 0.1–1.0)
Monocytes Relative: 18 %
Neutro Abs: 7.5 K/uL (ref 1.7–7.7)
Neutrophils Relative %: 62 %
Platelets: 302 K/uL (ref 150–400)
RBC: 3.83 MIL/uL — ABNORMAL LOW (ref 3.87–5.11)
RDW: 14.9 % (ref 11.5–15.5)
WBC: 12 K/uL — ABNORMAL HIGH (ref 4.0–10.5)
nRBC: 0 % (ref 0.0–0.2)

## 2024-04-23 LAB — BASIC METABOLIC PANEL WITH GFR
Anion gap: 11 (ref 5–15)
BUN: 10 mg/dL (ref 6–20)
CO2: 23 mmol/L (ref 22–32)
Calcium: 8.9 mg/dL (ref 8.9–10.3)
Chloride: 102 mmol/L (ref 98–111)
Creatinine, Ser: 0.53 mg/dL (ref 0.44–1.00)
GFR, Estimated: >60 mL/min (ref >60)
Glucose, Bld: 116 mg/dL — ABNORMAL HIGH (ref 70–99)
Potassium: 3.3 mmol/L — ABNORMAL LOW (ref 3.5–5.1)
Sodium: 136 mmol/L (ref 135–145)

## 2024-04-23 MED ORDER — SODIUM CHLORIDE 0.9 % IV SOLN
1.0000 g | Freq: Three times a day (TID) | INTRAVENOUS | Status: DC
  Administered 2024-04-23 – 2024-04-24 (×3): 1 g via INTRAVENOUS
  Filled 2024-04-23 (×4): qty 1

## 2024-04-23 NOTE — Plan of Care (Signed)

## 2024-04-23 NOTE — Care Management Important Message (Signed)
 Important Message  Patient Details  Name: [name] MRN: [id number] Date of Birth: 10/06/1970   Important Message Given:  Yes - Medicare IM     [name] 04/23/2024, 11:29 AM

## 2024-04-23 NOTE — Progress Notes (Signed)
 PROGRESS NOTE    [name]  FMW:[id number] DOB: 1971/01/08 DOA: 04/21/2024 PCP: Eino Tinnie BIRCH, MD    Brief Narrative:  53 y.o. female with medical history significant of multiple sclerosis, complicated by left-sided weakness neurological bladder status post ileal conduit, HTN chronic HFpEF, neuropathic pain, anxiety suppression, history of recurrent MDR UTI brought in by family member for evaluation of confusion and fever.   Patient started to have mild confusion for last 3 days, family also noticed cloudy urine and patient complained about episode of subjective fever and chills.  Denied any abdominal pain no chest pain no nausea vomiting or diarrhea.   Assessment & Plan:   Principal Problem:   UTI (urinary tract infection) Active Problems:   Acute metabolic encephalopathy  Sepsis with acute organ damage UTI with hematuria Acute metabolic encephalopathy secondary to above - Sepsis as evidenced by fever, leukocytosis, source of infection likely recurrent UTI.  Vital signs stable.  Patient does have a history of MDR Klebsiella.  Mental status improving.  Urine culture positive for Pseudomonas.  Sensitivities pending. Plan: DC Rocephin  Start empiric ceftazidime  Continue to follow cultures for pathogen identification and sensitivities    Multiple sclerosis Paraplegic Chronic left arm extremity weakness On arrival to the floor RN noted some apparent right-sided weakness.  MRI and was pursued of cervical spine and brain.  No contrast-enhancing lesions to suggest acute MS flare.  For formal neurology consultation for now  Neurological bladder Neurogenic bowel - Chronic wheelchair-bound   HTN Chronic HFpEF -Euvolemic, blood pressure stable     DVT prophylaxis: SQ Lovenox  Code Status: Full Family Communication: Brother at bedside 7/28 Disposition Plan: Status is: Inpatient Remains inpatient appropriate because: UTI and associated delirium   Level of care:  Telemetry Medical  Consultants:  None  Procedures:  None  Antimicrobials: Ceftriaxone    Subjective: Seen and examined.  Much more awake and alert this morning.  Objective: Vitals:   04/22/24 1432 04/22/24 2050 04/23/24 0344 04/23/24 0731  BP: (!) 150/72 (!) 157/75 (!) 157/79 (!) 141/65  Pulse: 89 97 91   Resp: 20 16 16 16   Temp: 97.9 F (36.6 C) 98.6 F (37 C) 98.8 F (37.1 C) 98.8 F (37.1 C)  TempSrc: Oral Oral    SpO2: 98%  98% 98%  Weight:      Height:        Intake/Output Summary (Last 24 hours) at 04/23/2024 1028 Last data filed at 04/23/2024 0926 Gross per 24 hour  Intake 1114.65 ml  Output 1350 ml  Net -235.35 ml   Filed Weights   04/21/24 0940  Weight: 87.8 kg    Examination:  General exam: NAD Respiratory system: Bibasilar crackles.  Normal work of breathing.  Room air Cardiovascular system: 1 S2, RRR, no murmurs, no pedal edema Gastrointestinal system: soft, mild TTP, normal bowel sounds Central nervous system: Alert.  Oriented x 3.  Chronic deficits Extremities: 0 X5 bilateral lower extremities, decreased bilateral upper extremities Skin: No rashes, lesions or ulcers Psychiatry: Judgement and insight appear impaired. Mood & affect flattened.     Data Reviewed: I have personally reviewed following labs and imaging studies  CBC: Recent Labs  Lab 04/21/24 0942 04/22/24 0331 04/23/24 0832  WBC 12.4* 13.6* 12.0*  NEUTROABS 7.6  --  7.5  HGB 13.4 12.5 11.4*  HCT 39.7 37.8 33.8*  MCV 89.0 89.2 88.3  PLT 366 332 302   Basic Metabolic Panel: Recent Labs  Lab 04/21/24 0942 04/23/24 0832  NA 137 136  K 3.7 3.3*  CL 101 102  CO2 23 23  GLUCOSE 108* 116*  BUN 13 10  CREATININE 0.55 0.53  CALCIUM 9.9 8.9   GFR: Estimated Creatinine Clearance: 86.2 mL/min (by C-G formula based on SCr of 0.53 mg/dL). Liver Function Tests: Recent Labs  Lab 04/21/24 0942  AST 19  ALT 23  ALKPHOS 75  BILITOT 0.8  PROT 9.5*  ALBUMIN 3.9   No  results for input(s): LIPASE, AMYLASE in the last 168 hours. No results for input(s): AMMONIA in the last 168 hours. Coagulation Profile: Recent Labs  Lab 04/21/24 0942  INR 1.1   Cardiac Enzymes: No results for input(s): CKTOTAL, CKMB, CKMBINDEX, TROPONINI in the last 168 hours. BNP (last 3 results) No results for input(s): PROBNP in the last 8760 hours. HbA1C: No results for input(s): HGBA1C in the last 72 hours. CBG: No results for input(s): GLUCAP in the last 168 hours. Lipid Profile: No results for input(s): CHOL, HDL, LDLCALC, TRIG, CHOLHDL, LDLDIRECT in the last 72 hours. Thyroid Function Tests: No results for input(s): TSH, T4TOTAL, FREET4, T3FREE, THYROIDAB in the last 72 hours. Anemia Panel: No results for input(s): VITAMINB12, FOLATE, FERRITIN, TIBC, IRON, RETICCTPCT in the last 72 hours. Sepsis Labs: Recent Labs  Lab 04/21/24 9057  LATICACIDVEN 1.0    Recent Results (from the past 240 hours)  Culture, blood (Routine x 2)     Status: None (Preliminary result)   Collection Time: 04/21/24  9:42 AM   Specimen: BLOOD  Result Value Ref Range Status   Specimen Description BLOOD BLOOD LEFT HAND  Final   Special Requests   Final    BOTTLES DRAWN AEROBIC AND ANAEROBIC Blood Culture adequate volume   Culture   Final    NO GROWTH 2 DAYS Performed at [facility], [address] [place], [place] [address]    Report Status PENDING  Incomplete  Urine Culture     Status: Abnormal (Preliminary result)   Collection Time: 04/21/24  9:42 AM   Specimen: Urine, Random  Result Value Ref Range Status   Specimen Description   Final    URINE, RANDOM Performed at [facility], [address], [place], [place] [address]    Special Requests   Final    NONE Reflexed from [id number] Performed at [facility], [address] [place], [place] [address]    Culture (A)  Final    >=100,000 COLONIES/mL  PSEUDOMONAS AERUGINOSA CULTURE REINCUBATED FOR BETTER GROWTH SUSCEPTIBILITIES TO FOLLOW Performed at [facility], [address] [place], [place] [address]    Report Status PENDING  Incomplete  Culture, blood (Routine x 2)     Status: None (Preliminary result)   Collection Time: 04/21/24  9:47 AM   Specimen: BLOOD  Result Value Ref Range Status   Specimen Description BLOOD BLOOD RIGHT HAND  Final   Special Requests   Final    BOTTLES DRAWN AEROBIC AND ANAEROBIC Blood Culture adequate volume   Culture   Final    NO GROWTH 2 DAYS Performed at [facility], [address] [place], [place] [address]    Report Status PENDING  Incomplete  Resp panel by RT-PCR (RSV, Flu A&B, Covid) Anterior Nasal Swab     Status: None   Collection Time: 04/21/24 10:00 AM   Specimen: Anterior Nasal Swab  Result Value Ref Range Status   SARS Coronavirus 2 by RT PCR NEGATIVE NEGATIVE Final    Comment: (NOTE) SARS-CoV-2 target nucleic  acids are NOT DETECTED.  The SARS-CoV-2 RNA is generally detectable in upper respiratory specimens during the acute phase of infection. The lowest concentration of SARS-CoV-2 viral copies this assay can detect is 138 copies/mL. A negative result does not preclude SARS-Cov-2 infection and should not be used as the sole basis for treatment or other patient management decisions. A negative result may occur with  improper specimen collection/handling, submission of specimen other than nasopharyngeal swab, presence of viral mutation(s) within the areas targeted by this assay, and inadequate number of viral copies(<138 copies/mL). A negative result must be combined with clinical observations, patient history, and epidemiological information. The expected result is Negative.  Fact Sheet for Patients:  BloggerCourse.com  Fact Sheet for Healthcare Providers:  SeriousBroker.it  This test is no t yet approved or  cleared by the [place]  FDA and  has been authorized for detection and/or diagnosis of SARS-CoV-2 by FDA under an Emergency Use Authorization (EUA). This EUA will remain  in effect (meaning this test can be used) for the duration of the COVID-19 declaration under Section 564(b)(1) of the Act, 21 U.S.C.section 360bbb-3(b)(1), unless the authorization is terminated  or revoked sooner.       Influenza A by PCR NEGATIVE NEGATIVE Final   Influenza B by PCR NEGATIVE NEGATIVE Final    Comment: (NOTE) The Xpert Xpress SARS-CoV-2/FLU/RSV plus assay is intended as an aid in the diagnosis of influenza from Nasopharyngeal swab specimens and should not be used as a sole basis for treatment. Nasal washings and aspirates are unacceptable for Xpert Xpress SARS-CoV-2/FLU/RSV testing.  Fact Sheet for Patients: BloggerCourse.com  Fact Sheet for Healthcare Providers: SeriousBroker.it  This test is not yet approved or cleared by the [place]  FDA and has been authorized for detection and/or diagnosis of SARS-CoV-2 by FDA under an Emergency Use Authorization (EUA). This EUA will remain in effect (meaning this test can be used) for the duration of the COVID-19 declaration under Section 564(b)(1) of the Act, 21 U.S.C. section 360bbb-3(b)(1), unless the authorization is terminated or revoked.     Resp Syncytial Virus by PCR NEGATIVE NEGATIVE Final    Comment: (NOTE) Fact Sheet for Patients: BloggerCourse.com  Fact Sheet for Healthcare Providers: SeriousBroker.it  This test is not yet approved or cleared by the [place]  FDA and has been authorized for detection and/or diagnosis of SARS-CoV-2 by FDA under an Emergency Use Authorization (EUA). This EUA will remain in effect (meaning this test can be used) for the duration of the COVID-19 declaration under Section 564(b)(1) of the Act, 21  U.S.C. section 360bbb-3(b)(1), unless the authorization is terminated or revoked.  Performed at [facility], [address] [place], [place] [address]          Radiology Studies: CT ABDOMEN PELVIS WO CONTRAST Result Date: 04/22/2024 CLINICAL DATA:  Confusion and fever EXAM: CT ABDOMEN AND PELVIS WITHOUT CONTRAST TECHNIQUE: Multidetector CT imaging of the abdomen and pelvis was performed following the standard protocol without IV contrast. RADIATION DOSE REDUCTION: This exam was performed according to the departmental dose-optimization program which includes automated exposure control, adjustment of the mA and/or kV according to patient size and/or use of iterative reconstruction technique. COMPARISON:  CT abdomen and pelvis dated 03/18/2024 FINDINGS: Lower chest: Bilateral lower lobe subsegmental atelectasis. No pleural effusion or pneumothorax demonstrated. Partially imaged heart size is normal. Hepatobiliary: No focal hepatic lesions. No intra or extrahepatic biliary ductal dilation. Cholelithiasis. Pancreas: No focal lesions or main ductal dilation. Spleen: Normal in size without  focal abnormality. Adrenals/Urinary Tract: No adrenal nodules. Duplex left kidney. No suspicious renal masses by noncontrast technique. Multifocal bilateral renal cortical scarring. No hydronephrosis or calculi. Right lower quadrant ileal conduit. 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 Stomach/Bowel: Normal appearance of the stomach. No evidence of bowel wall thickening, distention, or inflammatory changes. Dilated rectum contains large volume stool. Large volume stool throughout the remainder of the colon. Vascular/Lymphatic: Aortic atherosclerosis. 13 mm left para-aortic lymph node (2:30), previously 8 mm. Reproductive: No adnexal masses. Other: No free fluid, fluid collection, or free air. Musculoskeletal: No acute or abnormal lytic or blastic osseous lesions. Postsurgical  changes of L5-S1 fusion. Hardware appears intact. IMPRESSION: 1. Dilated rectum contains large volume stool, which may reflect fecal impaction. Large volume stool throughout the remainder of the colon. 2. Right lower quadrant ileal conduit with similar parastomal hernia containing loops of nondilated small bowel. 3. Cholelithiasis. 4. Interval increase in size of a 13 mm left para-aortic lymph node, previously 8 mm, likely reactive. 5.  Aortic Atherosclerosis (ICD10-I70.0). Electronically Signed   By: Limin  Xu M.D.   On: 04/22/2024 14:56   EEG adult Result Date: 04/22/2024 Gregg Lek, MD     04/22/2024  2:06 PM Patient Name: ADIBA FARGNOLI MRN: [id number] Epilepsy Attending: Lek Gregg Referring Physician/Provider: Date: 04/22/2024 Duration: 30 minutes Patient history: 53 year old with altered mental status. EEG to evaluate for seizure Level of alertness:  lethargic AEDs during EEG study: Gabapentin  Technical aspects: This EEG study was done with scalp electrodes positioned according to the 10-20 International system of electrode placement. Electrical activity was reviewed with band pass filter of 1-70Hz , sensitivity of 7 uV/mm, display speed of 38mm/sec with a 60Hz  notched filter applied as appropriate. EEG data were recorded continuously and digitally stored.  Video monitoring was available and reviewed as appropriate. Description: EEG showed continuous generalized slowing with low voltage and increase myogenic artifact.  Hyperventilation and photic stimulation were not performed.   ABNORMALITY - Continuous slow, generalized IMPRESSION: This study is suggestive of mild diffuse encephalopathy, nonspecific etiology but likely related to sedation, toxic-metabolic etiology. No seizures or epileptiform discharges were seen throughout the recording. Lek Gregg MD Neurology    MR BRAIN W WO CONTRAST Addendum Date: 04/21/2024 ADDENDUM REPORT: 04/21/2024 22:10 ADDENDUM: On further review, approximately 6 mm  left frontal convexity meningioma is unchanged. No mass effect. Electronically Signed   By: [place] GORMAN Molt M.D.   On: 04/21/2024 22:10   Result Date: 04/21/2024 CLINICAL DATA:  Demyelinating disease new right upper extremity weakness EXAM: MRI HEAD WITHOUT AND WITH CONTRAST TECHNIQUE: Multiplanar, multiecho pulse sequences of the brain and surrounding structures were obtained without and with intravenous contrast. CONTRAST:  9mL GADAVIST  GADOBUTROL  1 MMOL/ML IV SOLN COMPARISON:  MRI head December 08, 2023. FINDINGS: Brain: Stable predominately periventricular T2/FLAIR hyperintensities the white matter. Similar subtle foci in the brainstem. No new white matter lesions or an abnormal enhancement. No evidence of acute infarct, acute hemorrhage, mass lesion or midline shift. No hydrocephalus. Vascular: Major arterial flow voids are maintained. Skull and upper cervical spine: Normal marrow signal. Sinuses/Orbits: Negative. IMPRESSION: Stable white matter disease, compatible with reported multiple sclerosis. No new lesions or abnormal enhancement. Electronically Signed: By: [place] GORMAN Molt M.D. On: 04/21/2024 22:01   MR CERVICAL SPINE W WO CONTRAST Result Date: 04/21/2024 CLINICAL DATA:  Demyelinating disease new RUE weakness EXAM: MRI CERVICAL SPINE WITHOUT AND WITH CONTRAST TECHNIQUE: Multiplanar and multiecho pulse sequences of the cervical spine, to include the  craniocervical junction and cervicothoracic junction, were obtained without and with intravenous contrast. CONTRAST:  9mL GADAVIST  GADOBUTROL  1 MMOL/ML IV SOLN COMPARISON:  MRI cervical spine April 17, 24. FINDINGS: Alignment: No substantial sagittal subluxation. Vertebrae: No fracture, evidence of discitis, or bone lesion. Cord: No convincing cord signal abnormality. No abnormal enhancement. Posterior Fossa, vertebral arteries, paraspinal tissues: Negative. Disc levels: C2-C3: Posterior disc osteophyte complex without significant stenosis. C3-C4:  Posterior disc osteophyte complex and bilateral facet and uncovertebral hypertrophy. Similar mild right and moderate left foraminal stenosis. Mild canal stenosis. C4-C5: Posterior disc osteophyte complex with bilateral facet uncovertebral hypertrophy. Mild bilateral foraminal stenosis. No significant canal stenosis. C5-C6: Facet arthropathy without significant stenosis. Small disc bulge. C6-C7: Small posterior disc osteophyte complex without significant stenosis. C7-T1: No significant disc protrusion, foraminal stenosis, or canal stenosis. IMPRESSION: 1. No convincing cord signal abnormality or abnormal enhancement. 2. Similar multilevel degenerative change, greatest at C3-C4 where there is moderate left and mild right foraminal stenosis and mild canal stenosis. Electronically Signed   By: [place] GORMAN Molt M.D.   On: 04/21/2024 22:09   CT HEAD CODE STROKE WO CONTRAST` Result Date: 04/21/2024 CLINICAL DATA:  Code stroke. Neuro deficit, acute, stroke suspected. Confusion and fever. History of multiple sclerosis. EXAM: CT HEAD WITHOUT CONTRAST TECHNIQUE: Contiguous axial images were obtained from the base of the skull through the vertex without intravenous contrast. RADIATION DOSE REDUCTION: This exam was performed according to the departmental dose-optimization program which includes automated exposure control, adjustment of the mA and/or kV according to patient size and/or use of iterative reconstruction technique. COMPARISON:  Head CT and MRI 12/08/2023 FINDINGS: Brain: There is no evidence of an acute infarct, intracranial hemorrhage, mass, midline shift, or extra-axial fluid collection. Cerebral volume is normal. The ventricles are normal in size. Known chronic demyelinating disease involving the cerebral white matter on MRI is not was shown by CT. Vascular: No hyperdense vessel. Skull: No fracture or suspicious lesion. Sinuses/Orbits: Visualized paranasal sinuses and mastoid air cells are clear. Unremarkable  orbits. Other: None. ASPECTS ([place] Stroke Program Early CT Score) - Ganglionic level infarction (caudate, lentiform nuclei, internal capsule, insula, M1-M3 cortex): 7 - Supraganglionic infarction (M4-M6 cortex): 3 Total score (0-10 with 10 being normal): 10 These results were communicated to Dr. Cort Mana at 6:37 pm on 04/21/2024 by text page via the [facility] messaging system. IMPRESSION: No evidence of acute intracranial abnormality. ASPECTS of 10. Electronically Signed   By: Dasie Hamburg M.D.   On: 04/21/2024 18:37        Scheduled Meds:  amantadine   200 mg Oral BID   baclofen   20 mg Oral QID   enoxaparin  (LOVENOX ) injection  40 mg Subcutaneous Q24H   gabapentin   200 mg Oral BID   gabapentin   800 mg Oral QHS   lactulose   10 g Oral BID   pantoprazole   40 mg Oral Daily   senna  2 tablet Oral BID   Continuous Infusions:  cefTAZidime  (FORTAZ )  IV     lactated ringers  75 mL/hr at 04/22/24 0902     LOS: 2 days      Calvin KATHEE Robson, MD [facility]   If 7PM-7AM, please contact night-coverage  04/23/2024, 10:28 AM

## 2024-04-24 DIAGNOSIS — N3001 Acute cystitis with hematuria: Secondary | ICD-10-CM | POA: Diagnosis not present

## 2024-04-24 LAB — GLUCOSE, CAPILLARY: Glucose-Capillary: 98 mg/dL (ref 70–99)

## 2024-04-24 LAB — URINE CULTURE: Culture: >=100000 — AB

## 2024-04-24 MED ORDER — HYDROCODONE-ACETAMINOPHEN 5-325 MG PO TABS
1.0000 | ORAL_TABLET | ORAL | Status: DC | PRN
  Administered 2024-04-24 – 2024-04-25 (×3): 1 via ORAL
  Filled 2024-04-24 (×3): qty 1

## 2024-04-24 MED ORDER — KETOROLAC TROMETHAMINE 30 MG/ML IJ SOLN
30.0000 mg | Freq: Four times a day (QID) | INTRAMUSCULAR | Status: DC | PRN
  Administered 2024-04-25: 30 mg via INTRAVENOUS
  Filled 2024-04-24: qty 1

## 2024-04-24 MED ORDER — GENTAMICIN SULFATE 40 MG/ML IJ SOLN
360.0000 mg | Freq: Once | INTRAVENOUS | Status: AC
  Administered 2024-04-24: 360 mg via INTRAVENOUS
  Filled 2024-04-24: qty 9

## 2024-04-24 NOTE — Progress Notes (Signed)
 PROGRESS NOTE    [name]  FMW:[id number] DOB: 06/15/1971 DOA: 04/21/2024 PCP: Eino Tinnie BIRCH, MD   Brief Narrative:   53 y.o. female with medical history significant of multiple sclerosis, complicated by left-sided weakness neurological bladder status post ileal conduit, HTN chronic HFpEF, neuropathic pain, anxiety suppression, history of recurrent MDR UTI brought in by family member for evaluation of confusion and fever. Being treated for sepsis secondary to UTI, urine culture grew MRSA and pseudomonas.  Assessment & Plan:  Principal Problem:   UTI (urinary tract infection) Active Problems:   Acute metabolic encephalopathy   Sepsis with acute organ damage: resolving now UTI with hematuria, being treated Acute metabolic encephalopathy secondary to above, resolved now - Sepsis as evidenced by fever, leukocytosis, source of infection likely recurrent UTI.  Vital signs stable.  Patient does have a history of MDR Klebsiella.  Mental status is at baseline today.  Urine culture positive for Pseudomonas and MRSA.  Dced IV Ceftazidime  on 7/30. Plan to give a dose of IV gentamicin  today.    Multiple sclerosis, chronic Paraplegic Chronic left arm extremity weakness On arrival to the floor RN noted some apparent right-sided weakness.  MRI and was pursued of cervical spine and brain.  No contrast-enhancing lesions to suggest acute MS flare.    Neurological bladder Neurogenic bowel - Chronic wheelchair-bound   Hypertension: Chronic HFpEF -Euvolemic, blood pressure stable   Disposition: Home. Lives with her mother and has aides.   DVT prophylaxis: enoxaparin  (LOVENOX ) injection 40 mg Start: 04/21/24 1800     Code Status: Full Code Family Communication:   Status is: Inpatient Remains inpatient appropriate because: UTI   Subjective:  She needed help with breakfast. She feels weak. We spoke about the management of UTI and discharge plan.  Examination:  General exam:  Appears to be in mild distress Respiratory system: Clear to auscultation. Respiratory effort normal. Cardiovascular system: S1 & S2 heard, RRR. No JVD, murmurs, rubs, gallops or clicks. No pedal edema. Gastrointestinal system: Abdomen is nondistended, soft and nontender. No organomegaly or masses felt. Normal bowel sounds heard. Central nervous system: Alert and oriented. No focal neurological deficits. Extremities:  Power 0/5 LE, 3/5 RUE, 1/5 LUE Skin: No rashes, lesions or ulcers Psychiatry: Judgement and insight appear normal. Mood & affect appropriate.       Diet Orders (From admission, onward)     Start     Ordered   04/21/24 1354  DIET DYS 3 Room service appropriate? Yes; Fluid consistency: Thin  Diet effective now       Question Answer Comment  Room service appropriate? Yes   Fluid consistency: Thin      04/21/24 1353            Objective: Vitals:   04/23/24 1426 04/23/24 2054 04/24/24 0346 04/24/24 0708  BP: (!) 162/87 (!) 147/73 (!) 172/78 (!) 154/75  Pulse: 87 85 82 86  Resp: 15 16 19 16   Temp: 97.9 F (36.6 C) 99.4 F (37.4 C) 98.1 F (36.7 C) 99 F (37.2 C)  TempSrc: Oral     SpO2: 98% 98% 100%   Weight:      Height:        Intake/Output Summary (Last 24 hours) at 04/24/2024 1151 Last data filed at 04/24/2024 0900 Gross per 24 hour  Intake 2280 ml  Output 1550 ml  Net 730 ml   Filed Weights   04/21/24 0940  Weight: 87.8 kg    Scheduled Meds:  amantadine   200 mg Oral BID   baclofen   20 mg Oral QID   enoxaparin  (LOVENOX ) injection  40 mg Subcutaneous Q24H   gabapentin   200 mg Oral BID   gabapentin   800 mg Oral QHS   lactulose   10 g Oral BID   pantoprazole   40 mg Oral Daily   senna  2 tablet Oral BID   Continuous Infusions:  gentamicin  360 mg (04/24/24 1130)   lactated ringers  75 mL/hr at 04/24/24 1127    Nutritional status     Body mass index is 33.23 kg/m.  Data Reviewed:   CBC: Recent Labs  Lab 04/21/24 0942 04/22/24 0331  04/23/24 0832  WBC 12.4* 13.6* 12.0*  NEUTROABS 7.6  --  7.5  HGB 13.4 12.5 11.4*  HCT 39.7 37.8 33.8*  MCV 89.0 89.2 88.3  PLT 366 332 302   Basic Metabolic Panel: Recent Labs  Lab 04/21/24 0942 04/23/24 0832  NA 137 136  K 3.7 3.3*  CL 101 102  CO2 23 23  GLUCOSE 108* 116*  BUN 13 10  CREATININE 0.55 0.53  CALCIUM 9.9 8.9   GFR: Estimated Creatinine Clearance: 86.2 mL/min (by C-G formula based on SCr of 0.53 mg/dL). Liver Function Tests: Recent Labs  Lab 04/21/24 0942  AST 19  ALT 23  ALKPHOS 75  BILITOT 0.8  PROT 9.5*  ALBUMIN 3.9   No results for input(s): LIPASE, AMYLASE in the last 168 hours. No results for input(s): AMMONIA in the last 168 hours. Coagulation Profile: Recent Labs  Lab 04/21/24 0942  INR 1.1   Cardiac Enzymes: No results for input(s): CKTOTAL, CKMB, CKMBINDEX, TROPONINI in the last 168 hours. BNP (last 3 results) No results for input(s): PROBNP in the last 8760 hours. HbA1C: No results for input(s): HGBA1C in the last 72 hours. CBG: Recent Labs  Lab 04/24/24 0710  GLUCAP 98   Lipid Profile: No results for input(s): CHOL, HDL, LDLCALC, TRIG, CHOLHDL, LDLDIRECT in the last 72 hours. Thyroid Function Tests: No results for input(s): TSH, T4TOTAL, FREET4, T3FREE, THYROIDAB in the last 72 hours. Anemia Panel: No results for input(s): VITAMINB12, FOLATE, FERRITIN, TIBC, IRON, RETICCTPCT in the last 72 hours. Sepsis Labs: Recent Labs  Lab 04/21/24 9057  LATICACIDVEN 1.0    Recent Results (from the past 240 hours)  Culture, blood (Routine x 2)     Status: None (Preliminary result)   Collection Time: 04/21/24  9:42 AM   Specimen: BLOOD  Result Value Ref Range Status   Specimen Description BLOOD BLOOD LEFT HAND  Final   Special Requests   Final    BOTTLES DRAWN AEROBIC AND ANAEROBIC Blood Culture adequate volume   Culture   Final    NO GROWTH 3 DAYS Performed at [facility], [address], [place], [place] [address]    Report Status PENDING  Incomplete  Urine Culture     Status: Abnormal   Collection Time: 04/21/24  9:42 AM   Specimen: Urine, Random  Result Value Ref Range Status   Specimen Description   Final    URINE, RANDOM Performed at [facility], [address], [place], [place] [address]    Special Requests   Final    NONE Reflexed from [phone] Performed at [facility], [address] [place], [place] [address]    Culture (A)  Final    >=100,000 COLONIES/mL PSEUDOMONAS AERUGINOSA 30,000 COLONIES/mL STAPHYLOCOCCUS AUREUS METHICILLIN RESISTANT STAPHYLOCOCCUS AUREUS    Report Status 04/24/2024 FINAL  Final  Organism ID, Bacteria PSEUDOMONAS AERUGINOSA (A)  Final   Organism ID, Bacteria STAPHYLOCOCCUS AUREUS (A)  Final      Susceptibility   Pseudomonas aeruginosa - MIC*    CEFTAZIDIME  16 INTERMEDIATE Intermediate     CIPROFLOXACIN  1 INTERMEDIATE Intermediate     GENTAMICIN  2 SENSITIVE Sensitive     IMIPENEM 2 SENSITIVE Sensitive     * >=100,000 COLONIES/mL PSEUDOMONAS AERUGINOSA   Staphylococcus aureus - MIC*    CIPROFLOXACIN  >=8 RESISTANT Resistant     GENTAMICIN  <=0.5 SENSITIVE Sensitive     NITROFURANTOIN <=16 SENSITIVE Sensitive     OXACILLIN >=4 RESISTANT Resistant     TETRACYCLINE >=16 RESISTANT Resistant     VANCOMYCIN  <=0.5 SENSITIVE Sensitive     TRIMETH /SULFA  >=320 RESISTANT Resistant     RIFAMPIN <=0.5 SENSITIVE Sensitive     Inducible Clindamycin NEGATIVE Sensitive     LINEZOLID 2 SENSITIVE Sensitive     * 30,000 COLONIES/mL STAPHYLOCOCCUS AUREUS  Culture, blood (Routine x 2)     Status: None (Preliminary result)   Collection Time: 04/21/24  9:47 AM   Specimen: BLOOD  Result Value Ref Range Status   Specimen Description BLOOD BLOOD RIGHT HAND  Final   Special Requests   Final    BOTTLES DRAWN AEROBIC AND ANAEROBIC Blood Culture adequate volume   Culture   Final    NO GROWTH 3  DAYS Performed at [facility], [address], [place], [place] [address]    Report Status PENDING  Incomplete  Resp panel by RT-PCR (RSV, Flu A&B, Covid) Anterior Nasal Swab     Status: None   Collection Time: 04/21/24 10:00 AM   Specimen: Anterior Nasal Swab  Result Value Ref Range Status   SARS Coronavirus 2 by RT PCR NEGATIVE NEGATIVE Final    Comment: (NOTE) SARS-CoV-2 target nucleic acids are NOT DETECTED.  The SARS-CoV-2 RNA is generally detectable in upper respiratory specimens during the acute phase of infection. The lowest concentration of SARS-CoV-2 viral copies this assay can detect is 138 copies/mL. A negative result does not preclude SARS-Cov-2 infection and should not be used as the sole basis for treatment or other patient management decisions. A negative result may occur with  improper specimen collection/handling, submission of specimen other than nasopharyngeal swab, presence of viral mutation(s) within the areas targeted by this assay, and inadequate number of viral copies(<138 copies/mL). A negative result must be combined with clinical observations, patient history, and epidemiological information. The expected result is Negative.  Fact Sheet for Patients:  BloggerCourse.com  Fact Sheet for Healthcare Providers:  SeriousBroker.it  This test is no t yet approved or cleared by the [place]  FDA and  has been authorized for detection and/or diagnosis of SARS-CoV-2 by FDA under an Emergency Use Authorization (EUA). This EUA will remain  in effect (meaning this test can be used) for the duration of the COVID-19 declaration under Section 564(b)(1) of the Act, 21 U.S.C.section 360bbb-3(b)(1), unless the authorization is terminated  or revoked sooner.       Influenza A by PCR NEGATIVE NEGATIVE Final   Influenza B by PCR NEGATIVE NEGATIVE Final    Comment: (NOTE) The Xpert Xpress  SARS-CoV-2/FLU/RSV plus assay is intended as an aid in the diagnosis of influenza from Nasopharyngeal swab specimens and should not be used as a sole basis for treatment. Nasal washings and aspirates are unacceptable for Xpert Xpress SARS-CoV-2/FLU/RSV testing.  Fact Sheet for Patients: BloggerCourse.com  Fact Sheet for Healthcare Providers: SeriousBroker.it  This  test is not yet approved or cleared by the [place]  FDA and has been authorized for detection and/or diagnosis of SARS-CoV-2 by FDA under an Emergency Use Authorization (EUA). This EUA will remain in effect (meaning this test can be used) for the duration of the COVID-19 declaration under Section 564(b)(1) of the Act, 21 U.S.C. section 360bbb-3(b)(1), unless the authorization is terminated or revoked.     Resp Syncytial Virus by PCR NEGATIVE NEGATIVE Final    Comment: (NOTE) Fact Sheet for Patients: BloggerCourse.com  Fact Sheet for Healthcare Providers: SeriousBroker.it  This test is not yet approved or cleared by the [place]  FDA and has been authorized for detection and/or diagnosis of SARS-CoV-2 by FDA under an Emergency Use Authorization (EUA). This EUA will remain in effect (meaning this test can be used) for the duration of the COVID-19 declaration under Section 564(b)(1) of the Act, 21 U.S.C. section 360bbb-3(b)(1), unless the authorization is terminated or revoked.  Performed at [facility], [address] [place], [place] [address]          Radiology Studies: EEG adult Result Date: 04/22/2024 Gregg Lek, MD     04/22/2024  2:06 PM Patient Name: [name] MRN: [id number] Epilepsy Attending: Lek Gregg Referring Physician/Provider: Date: 04/22/2024 Duration: 30 minutes Patient history: 53 year old with altered mental status. EEG to evaluate for seizure Level of alertness:   lethargic AEDs during EEG study: Gabapentin  Technical aspects: This EEG study was done with scalp electrodes positioned according to the 10-20 International system of electrode placement. Electrical activity was reviewed with band pass filter of 1-70Hz , sensitivity of 7 uV/mm, display speed of 8mm/sec with a 60Hz  notched filter applied as appropriate. EEG data were recorded continuously and digitally stored.  Video monitoring was available and reviewed as appropriate. Description: EEG showed continuous generalized slowing with low voltage and increase myogenic artifact.  Hyperventilation and photic stimulation were not performed.   ABNORMALITY - Continuous slow, generalized IMPRESSION: This study is suggestive of mild diffuse encephalopathy, nonspecific etiology but likely related to sedation, toxic-metabolic etiology. No seizures or epileptiform discharges were seen throughout the recording. Lek Gregg MD Neurology        LOS: 3 days   Time spent= 44 mins    Deliliah Room, MD [facility]  If 7PM-7AM, please contact night-coverage  04/24/2024, 11:51 AM

## 2024-04-24 NOTE — Plan of Care (Signed)

## 2024-04-24 NOTE — Plan of Care (Signed)

## 2024-04-25 DIAGNOSIS — N3001 Acute cystitis with hematuria: Secondary | ICD-10-CM | POA: Diagnosis not present

## 2024-04-25 DIAGNOSIS — G9341 Metabolic encephalopathy: Secondary | ICD-10-CM | POA: Diagnosis not present

## 2024-04-25 LAB — BASIC METABOLIC PANEL WITH GFR
Anion gap: 7 (ref 5–15)
BUN: 9 mg/dL (ref 6–20)
CO2: 26 mmol/L (ref 22–32)
Calcium: 8.7 mg/dL — ABNORMAL LOW (ref 8.9–10.3)
Chloride: 107 mmol/L (ref 98–111)
Creatinine, Ser: 0.42 mg/dL — ABNORMAL LOW (ref 0.44–1.00)
GFR, Estimated: >60 mL/min (ref >60)
Glucose, Bld: 90 mg/dL (ref 70–99)
Potassium: 3.4 mmol/L — ABNORMAL LOW (ref 3.5–5.1)
Sodium: 140 mmol/L (ref 135–145)

## 2024-04-25 LAB — CBC
HCT: 33.9 % — ABNORMAL LOW (ref 36.0–46.0)
Hemoglobin: 11.2 g/dL — ABNORMAL LOW (ref 12.0–15.0)
MCH: 29.6 pg (ref 26.0–34.0)
MCHC: 33 g/dL (ref 30.0–36.0)
MCV: 89.4 fL (ref 80.0–100.0)
Platelets: 321 K/uL (ref 150–400)
RBC: 3.79 MIL/uL — ABNORMAL LOW (ref 3.87–5.11)
RDW: 14.6 % (ref 11.5–15.5)
WBC: 10.7 K/uL — ABNORMAL HIGH (ref 4.0–10.5)
nRBC: 0 % (ref 0.0–0.2)

## 2024-04-25 MED ORDER — POTASSIUM CHLORIDE 20 MEQ PO PACK
40.0000 meq | PACK | Freq: Once | ORAL | Status: AC
  Administered 2024-04-25: 40 meq via ORAL
  Filled 2024-04-25: qty 2

## 2024-04-25 MED ORDER — HYDROCODONE-ACETAMINOPHEN 5-325 MG PO TABS
1.0000 | ORAL_TABLET | Freq: Once | ORAL | Status: AC
  Administered 2024-04-25: 1 via ORAL
  Filled 2024-04-25: qty 1

## 2024-04-25 NOTE — Discharge Summary (Signed)
 Physician Discharge Summary   Patient: [name] MRN: [id number] DOB: 20-Aug-1971  Admit date:     04/21/2024  Discharge date: 04/25/24  Discharge Physician: [name]   PCP: Samantha Tinnie BIRCH, MD   Recommendations at discharge:  Please obtain CBC and BMP on follow-up Follow-up with primary care provider Follow-up with neurology  Discharge Diagnoses: Principal Problem:   UTI (urinary tract infection) Active Problems:   Acute metabolic encephalopathy   Hospital Course: 53 y.o. female with medical history significant of multiple sclerosis, complicated by left-sided weakness neurological bladder status post ileal conduit, HTN chronic HFpEF, neuropathic pain, anxiety suppression, history of recurrent MDR UTI brought in by family member for evaluation of confusion and fever. Being treated for sepsis secondary to UTI, urine culture grew MRSA and pseudomonas.   Patient received ceftriaxone  followed by ceftazidime  and 1 dose of IV gentamicin .  Patient will remain high risk for recurrent UTI with history of neurogenic bladder, Foley care instructions were provided.  Patient is at baseline bedbound due to progressive MS.  Patient did had an MRI of cervical spine and brain which was negative for any contrast-enhancing lesion to suggest acute MS flare.  Patient to have nonspecific aches and pain for which she take gabapentin  and will continue at home.  She will need a close follow-up with her outpatient neurologist for further assistance.  Patient currently at baseline and will continue her home medications.  She need to follow-up with her providers closely for further assistance.   Consultants: None Procedures performed: None Disposition: Home Diet recommendation:  Discharge Diet Orders (From admission, onward)     Start     Ordered   04/25/24 0000  Diet - low sodium heart healthy        04/25/24 1054           Regular diet DISCHARGE MEDICATION: Allergies as of 04/25/2024        Reactions   Latex Swelling, Other (See Comments)   Reaction:  Facial swelling    Vancomycin  Itching        Medication List     STOP taking these medications    sulfamethoxazole -trimethoprim  400-80 MG tablet Commonly known as: Bactrim        TAKE these medications    acetaminophen  500 MG tablet Commonly known as: TYLENOL  Take 500 mg by mouth every 6 (six) hours as needed.   amantadine  100 MG capsule Commonly known as: SYMMETREL  Take 200 mg by mouth 2 (two) times daily.   Aquacel Ag Foam 6X6 Pads Apply 1 each topically daily in the afternoon.   baclofen  20 MG tablet Commonly known as: LIORESAL  Take 20 mg by mouth 4 (four) times daily.   Dulcolax 5 MG EC tablet Generic drug: bisacodyl  Take 1 tablet (5 mg total) by mouth daily as needed for moderate constipation.   DULoxetine  60 MG capsule Commonly known as: CYMBALTA  Take 60 mg by mouth daily.   Foam Dressing Circular Border Pads Apply 1 each topically every 3 (three) days.   gabapentin  100 MG capsule Commonly known as: NEURONTIN  Take 200 mg by mouth in the morning, at noon, and at bedtime.   gabapentin  800 MG tablet Commonly known as: NEURONTIN  Take 800 mg by mouth at bedtime.   lactulose  10 GM/15ML solution Commonly known as: CHRONULAC  Take 10 g by mouth daily.   ondansetron  4 MG disintegrating tablet Commonly known as: ZOFRAN -ODT Take 1 tablet (4 mg total) by mouth every 8 (eight) hours as needed.   pantoprazole   40 MG tablet Commonly known as: PROTONIX  Take 1 tablet by mouth daily.   senna 8.6 MG tablet Commonly known as: SENOKOT Take 2 tablets by mouth 2 (two) times daily.        Follow-up Information     Samantha Tinnie BIRCH, MD. Schedule an appointment as soon as possible for a visit in 1 week(s).   Specialty: Internal Medicine Contact information: [address] [place] [address] [phone]                Discharge Exam: [name]   04/21/24 0940   Weight: 87.8 kg   General.  Frail lady, in no acute distress. Pulmonary.  Lungs clear bilaterally, normal respiratory effort. CV.  Regular rate and rhythm, no JVD, rub or murmur. Abdomen.  Soft, nontender, nondistended, BS positive. CNS.  Alert and oriented .  No new focal neurologic deficit. Extremities.  No edema, pulses intact and symmetrical. Psychiatry.  Judgment and insight appears normal.   Condition at discharge: stable  The results of significant diagnostics from this hospitalization (including imaging, microbiology, ancillary and laboratory) are listed below for reference.   Imaging Studies: CT ABDOMEN PELVIS WO CONTRAST Result Date: 04/22/2024 CLINICAL DATA:  Confusion and fever EXAM: CT ABDOMEN AND PELVIS WITHOUT CONTRAST TECHNIQUE: Multidetector CT imaging of the abdomen and pelvis was performed following the standard protocol without IV contrast. RADIATION DOSE REDUCTION: This exam was performed according to the departmental dose-optimization program which includes automated exposure control, adjustment of the mA and/or kV according to patient size and/or use of iterative reconstruction technique. COMPARISON:  CT abdomen and pelvis dated 03/18/2024 FINDINGS: Lower chest: Bilateral lower lobe subsegmental atelectasis. No pleural effusion or pneumothorax demonstrated. Partially imaged heart size is normal. Hepatobiliary: No focal hepatic lesions. No intra or extrahepatic biliary ductal dilation. Cholelithiasis. Pancreas: No focal lesions or main ductal dilation. Spleen: Normal in size without focal abnormality. Adrenals/Urinary Tract: No adrenal nodules. Duplex left kidney. No suspicious renal masses by noncontrast technique. Multifocal bilateral renal cortical scarring. No hydronephrosis or calculi. Right lower quadrant ileal conduit. 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 Stomach/Bowel: Normal appearance of the stomach. No evidence of bowel wall  thickening, distention, or inflammatory changes. Dilated rectum contains large volume stool. Large volume stool throughout the remainder of the colon. Vascular/Lymphatic: Aortic atherosclerosis. 13 mm left para-aortic lymph node (2:30), previously 8 mm. Reproductive: No adnexal masses. Other: No free fluid, fluid collection, or free air. Musculoskeletal: No acute or abnormal lytic or blastic osseous lesions. Postsurgical changes of L5-S1 fusion. Hardware appears intact. IMPRESSION: 1. Dilated rectum contains large volume stool, which may reflect fecal impaction. Large volume stool throughout the remainder of the colon. 2. Right lower quadrant ileal conduit with similar parastomal hernia containing loops of nondilated small bowel. 3. Cholelithiasis. 4. Interval increase in size of a 13 mm left para-aortic lymph node, previously 8 mm, likely reactive. 5.  Aortic Atherosclerosis (ICD10-I70.0). Electronically Signed   By: Limin  Xu M.D.   On: 04/22/2024 14:56   EEG adult Result Date: 04/22/2024 Gregg Lek, MD     04/22/2024  2:06 PM Patient Name: KENDELL GAMMON MRN: [id number] Epilepsy Attending: Lek Gregg Referring Physician/Provider: Date: 04/22/2024 Duration: 30 minutes Patient history: 53 year old with altered mental status. EEG to evaluate for seizure Level of alertness:  lethargic AEDs during EEG study: Gabapentin  Technical aspects: This EEG study was done with scalp electrodes positioned according to the 10-20 International system of electrode placement. Electrical activity  was reviewed with band pass filter of 1-70Hz , sensitivity of 7 uV/mm, display speed of 83mm/sec with a 60Hz  notched filter applied as appropriate. EEG data were recorded continuously and digitally stored.  Video monitoring was available and reviewed as appropriate. Description: EEG showed continuous generalized slowing with low voltage and increase myogenic artifact.  Hyperventilation and photic stimulation were not performed.    ABNORMALITY - Continuous slow, generalized IMPRESSION: This study is suggestive of mild diffuse encephalopathy, nonspecific etiology but likely related to sedation, toxic-metabolic etiology. No seizures or epileptiform discharges were seen throughout the recording. Pastor Falling MD Neurology    MR BRAIN W WO CONTRAST Addendum Date: 04/21/2024 ADDENDUM REPORT: 04/21/2024 22:10 ADDENDUM: On further review, approximately 6 mm left frontal convexity meningioma is unchanged. No mass effect. Electronically Signed   By: Gilmore GORMAN Molt M.D.   On: 04/21/2024 22:10   Result Date: 04/21/2024 CLINICAL DATA:  Demyelinating disease new right upper extremity weakness EXAM: MRI HEAD WITHOUT AND WITH CONTRAST TECHNIQUE: Multiplanar, multiecho pulse sequences of the brain and surrounding structures were obtained without and with intravenous contrast. CONTRAST:  9mL GADAVIST  GADOBUTROL  1 MMOL/ML IV SOLN COMPARISON:  MRI head December 08, 2023. FINDINGS: Brain: Stable predominately periventricular T2/FLAIR hyperintensities the white matter. Similar subtle foci in the brainstem. No new white matter lesions or an abnormal enhancement. No evidence of acute infarct, acute hemorrhage, mass lesion or midline shift. No hydrocephalus. Vascular: Major arterial flow voids are maintained. Skull and upper cervical spine: Normal marrow signal. Sinuses/Orbits: Negative. IMPRESSION: Stable white matter disease, compatible with reported multiple sclerosis. No new lesions or abnormal enhancement. Electronically Signed: By: Gilmore GORMAN Molt M.D. On: 04/21/2024 22:01   MR CERVICAL SPINE W WO CONTRAST Result Date: 04/21/2024 CLINICAL DATA:  Demyelinating disease new RUE weakness EXAM: MRI CERVICAL SPINE WITHOUT AND WITH CONTRAST TECHNIQUE: Multiplanar and multiecho pulse sequences of the cervical spine, to include the craniocervical junction and cervicothoracic junction, were obtained without and with intravenous contrast. CONTRAST:  9mL GADAVIST   GADOBUTROL  1 MMOL/ML IV SOLN COMPARISON:  MRI cervical spine April 17, 24. FINDINGS: Alignment: No substantial sagittal subluxation. Vertebrae: No fracture, evidence of discitis, or bone lesion. Cord: No convincing cord signal abnormality. No abnormal enhancement. Posterior Fossa, vertebral arteries, paraspinal tissues: Negative. Disc levels: C2-C3: Posterior disc osteophyte complex without significant stenosis. C3-C4: Posterior disc osteophyte complex and bilateral facet and uncovertebral hypertrophy. Similar mild right and moderate left foraminal stenosis. Mild canal stenosis. C4-C5: Posterior disc osteophyte complex with bilateral facet uncovertebral hypertrophy. Mild bilateral foraminal stenosis. No significant canal stenosis. C5-C6: Facet arthropathy without significant stenosis. Small disc bulge. C6-C7: Small posterior disc osteophyte complex without significant stenosis. C7-T1: No significant disc protrusion, foraminal stenosis, or canal stenosis. IMPRESSION: 1. No convincing cord signal abnormality or abnormal enhancement. 2. Similar multilevel degenerative change, greatest at C3-C4 where there is moderate left and mild right foraminal stenosis and mild canal stenosis. Electronically Signed   By: Gilmore GORMAN Molt M.D.   On: 04/21/2024 22:09   CT HEAD CODE STROKE WO CONTRAST` Result Date: 04/21/2024 CLINICAL DATA:  Code stroke. Neuro deficit, acute, stroke suspected. Confusion and fever. History of multiple sclerosis. EXAM: CT HEAD WITHOUT CONTRAST TECHNIQUE: Contiguous axial images were obtained from the base of the skull through the vertex without intravenous contrast. RADIATION DOSE REDUCTION: This exam was performed according to the departmental dose-optimization program which includes automated exposure control, adjustment of the mA and/or kV according to patient size and/or use of iterative reconstruction technique. COMPARISON:  Head CT and  MRI 12/08/2023 FINDINGS: Brain: There is no evidence of an  acute infarct, intracranial hemorrhage, mass, midline shift, or extra-axial fluid collection. Cerebral volume is normal. The ventricles are normal in size. Known chronic demyelinating disease involving the cerebral white matter on MRI is not was shown by CT. Vascular: No hyperdense vessel. Skull: No fracture or suspicious lesion. Sinuses/Orbits: Visualized paranasal sinuses and mastoid air cells are clear. Unremarkable orbits. Other: None. ASPECTS (Alberta Stroke Program Early CT Score) - Ganglionic level infarction (caudate, lentiform nuclei, internal capsule, insula, M1-M3 cortex): 7 - Supraganglionic infarction (M4-M6 cortex): 3 Total score (0-10 with 10 being normal): 10 These results were communicated to Dr. Cort Mana at 6:37 pm on 04/21/2024 by text page via the [facility] messaging system. IMPRESSION: No evidence of acute intracranial abnormality. ASPECTS of 10. Electronically Signed   By: Dasie Hamburg M.D.   On: 04/21/2024 18:37   DG Chest Port 1 View if patient is in a treatment room. Result Date: 04/21/2024 CLINICAL DATA:  Suspected sepsis. EXAM: PORTABLE CHEST 1 VIEW COMPARISON:  Chest radiograph dated 05/02/2023. FINDINGS: Linear right lung base atelectasis/scarring. No focal consolidation, pleural effusion or pneumothorax. The cardiac silhouette is within limits. No acute osseous pathology. IMPRESSION: No active disease. Electronically Signed   By: Vanetta Chou M.D.   On: 04/21/2024 10:43    Microbiology: Results for orders placed or performed during the hospital encounter of 04/21/24  Culture, blood (Routine x 2)     Status: None (Preliminary result)   Collection Time: 04/21/24  9:42 AM   Specimen: BLOOD  Result Value Ref Range Status   Specimen Description BLOOD BLOOD LEFT HAND  Final   Special Requests   Final    BOTTLES DRAWN AEROBIC AND ANAEROBIC Blood Culture adequate volume   Culture   Final    NO GROWTH 4 DAYS Performed at [facility], [address] [place],  [place] [address]    Report Status PENDING  Incomplete  Urine Culture     Status: Abnormal   Collection Time: 04/21/24  9:42 AM   Specimen: Urine, Random  Result Value Ref Range Status   Specimen Description   Final    URINE, RANDOM Performed at [facility], [address] [place], [place] [address]    Special Requests   Final    NONE Reflexed from [id number] Performed at [facility], [address] [place], [place] [address]    Culture (A)  Final    >=100,000 COLONIES/mL PSEUDOMONAS AERUGINOSA 30,000 COLONIES/mL STAPHYLOCOCCUS AUREUS METHICILLIN RESISTANT STAPHYLOCOCCUS AUREUS    Report Status 04/24/2024 FINAL  Final   Organism ID, Bacteria PSEUDOMONAS AERUGINOSA (A)  Final   Organism ID, Bacteria STAPHYLOCOCCUS AUREUS (A)  Final      Susceptibility   Pseudomonas aeruginosa - MIC*    CEFTAZIDIME  16 INTERMEDIATE Intermediate     CIPROFLOXACIN  1 INTERMEDIATE Intermediate     GENTAMICIN  2 SENSITIVE Sensitive     IMIPENEM 2 SENSITIVE Sensitive     * >=100,000 COLONIES/mL PSEUDOMONAS AERUGINOSA   Staphylococcus aureus - MIC*    CIPROFLOXACIN  >=8 RESISTANT Resistant     GENTAMICIN  <=0.5 SENSITIVE Sensitive     NITROFURANTOIN <=16 SENSITIVE Sensitive     OXACILLIN >=4 RESISTANT Resistant     TETRACYCLINE >=16 RESISTANT Resistant     VANCOMYCIN  <=0.5 SENSITIVE Sensitive     TRIMETH /SULFA  >=320 RESISTANT Resistant     RIFAMPIN <=0.5 SENSITIVE Sensitive     Inducible Clindamycin NEGATIVE Sensitive  LINEZOLID 2 SENSITIVE Sensitive     * 30,000 COLONIES/mL STAPHYLOCOCCUS AUREUS  Culture, blood (Routine x 2)     Status: None (Preliminary result)   Collection Time: 04/21/24  9:47 AM   Specimen: BLOOD  Result Value Ref Range Status   Specimen Description BLOOD BLOOD RIGHT HAND  Final   Special Requests   Final    BOTTLES DRAWN AEROBIC AND ANAEROBIC Blood Culture adequate volume   Culture   Final    NO GROWTH 4 DAYS Performed at [facility], [address], [place], [place] [address]    Report Status PENDING  Incomplete  Resp panel by RT-PCR (RSV, Flu A&B, Covid) Anterior Nasal Swab     Status: None   Collection Time: 04/21/24 10:00 AM   Specimen: Anterior Nasal Swab  Result Value Ref Range Status   SARS Coronavirus 2 by RT PCR NEGATIVE NEGATIVE Final    Comment: (NOTE) SARS-CoV-2 target nucleic acids are NOT DETECTED.  The SARS-CoV-2 RNA is generally detectable in upper respiratory specimens during the acute phase of infection. The lowest concentration of SARS-CoV-2 viral copies this assay can detect is 138 copies/mL. A negative result does not preclude SARS-Cov-2 infection and should not be used as the sole basis for treatment or other patient management decisions. A negative result may occur with  improper specimen collection/handling, submission of specimen other than nasopharyngeal swab, presence of viral mutation(s) within the areas targeted by this assay, and inadequate number of viral copies(<138 copies/mL). A negative result must be combined with clinical observations, patient history, and epidemiological information. The expected result is Negative.  Fact Sheet for Patients:  BloggerCourse.com  Fact Sheet for Healthcare Providers:  SeriousBroker.it  This test is no t yet approved or cleared by the [place]  FDA and  has been authorized for detection and/or diagnosis of SARS-CoV-2 by FDA under an Emergency Use Authorization (EUA). This EUA will remain  in effect (meaning this test can be used) for the duration of the COVID-19 declaration under Section 564(b)(1) of the Act, 21 U.S.C.section 360bbb-3(b)(1), unless the authorization is terminated  or revoked sooner.       Influenza A by PCR NEGATIVE NEGATIVE Final   Influenza B by PCR NEGATIVE NEGATIVE Final    Comment: (NOTE) The Xpert Xpress SARS-CoV-2/FLU/RSV plus assay is intended as an aid in the diagnosis of  influenza from Nasopharyngeal swab specimens and should not be used as a sole basis for treatment. Nasal washings and aspirates are unacceptable for Xpert Xpress SARS-CoV-2/FLU/RSV testing.  Fact Sheet for Patients: BloggerCourse.com  Fact Sheet for Healthcare Providers: SeriousBroker.it  This test is not yet approved or cleared by the [place]  FDA and has been authorized for detection and/or diagnosis of SARS-CoV-2 by FDA under an Emergency Use Authorization (EUA). This EUA will remain in effect (meaning this test can be used) for the duration of the COVID-19 declaration under Section 564(b)(1) of the Act, 21 U.S.C. section 360bbb-3(b)(1), unless the authorization is terminated or revoked.     Resp Syncytial Virus by PCR NEGATIVE NEGATIVE Final    Comment: (NOTE) Fact Sheet for Patients: BloggerCourse.com  Fact Sheet for Healthcare Providers: SeriousBroker.it  This test is not yet approved or cleared by the [place]  FDA and has been authorized for detection and/or diagnosis of SARS-CoV-2 by FDA under an Emergency Use Authorization (EUA). This EUA will remain in effect (meaning this test can be used) for the duration of the COVID-19 declaration under Section 564(b)(1) of the  Act, 21 U.S.C. section 360bbb-3(b)(1), unless the authorization is terminated or revoked.  Performed at [facility], [address] [place], [place] [address]     Labs: CBC: Recent Labs  Lab 04/21/24 [id number] 04/22/24 0331 04/23/24 0832 04/25/24 0342  WBC 12.4* 13.6* 12.0* 10.7*  NEUTROABS 7.6  --  7.5  --   HGB 13.4 12.5 11.4* 11.2*  HCT 39.7 37.8 33.8* 33.9*  MCV 89.0 89.2 88.3 89.4  PLT 366 332 302 321   Basic Metabolic Panel: Recent Labs  Lab 04/21/24 0942 04/23/24 0832 04/25/24 0342  NA 137 136 140  K 3.7 3.3* 3.4*  CL 101 102 107  CO2 23 23 26   GLUCOSE 108*  116* 90  BUN 13 10 9   CREATININE 0.55 0.53 0.42*  CALCIUM 9.9 8.9 8.7*   Liver Function Tests: Recent Labs  Lab 04/21/24 0942  AST 19  ALT 23  ALKPHOS 75  BILITOT 0.8  PROT 9.5*  ALBUMIN 3.9   CBG: Recent Labs  Lab 04/24/24 0710  GLUCAP 98    Discharge time spent: greater than 30 minutes.  This record has been created using Conservation officer, historic buildings. Errors have been sought and corrected,but may not always be located. Such creation errors do not reflect on the standard of care.   Signed: Amaryllis Dare, MD [facility] 04/25/2024

## 2024-04-25 NOTE — Plan of Care (Signed)

## 2024-04-25 NOTE — TOC Initial Note (Signed)
 Transition of Care [facility]) - Initial/Assessment Note    Patient Details  Name: [name] MRN: [id number] Date of Birth: [birth year]  Transition of Care [facility]) CM/SW Contact:    Asberry CHRISTELLA Jaksch, RN Phone Number: 04/25/2024, 12:01 PM  Clinical Narrative:                 Admitted for: UTI Admitted from: Home  PCP: Tinnie Kaufman. MD Current home health/prior home health/DME: Patient states that she has a power wheelchair, hospital bed, grab bars toilet/shower. She has a home health aid 6 days/week.   Spoke with patient and family at bedside. Notified of plan to discharge home. Patient verified that her mother would be home and that she has required DME and a home health aid 6 days/week. Medical necessity packet in chart. EMS transport arranged with [facility]. Nurse notified of transport.   Expected Discharge Plan: Home/Self Care Barriers to Discharge: No Barriers Identified   Patient Goals and CMS Choice Patient states their goals for this hospitalization and ongoing recovery are:: going home          Expected Discharge Plan and Services         Expected Discharge Date: 04/25/24                                    Prior Living Arrangements/Services   Lives with:: Parents Patient language and need for interpreter reviewed:: Yes Do you feel safe going back to the place where you live?: Yes        Care giver support system in place?: Yes (comment) Current home services: Homehealth aide, DME Criminal Activity/Legal Involvement Pertinent to Current Situation/Hospitalization: No - Comment as needed  Activities of Daily Living      Permission Sought/Granted Permission sought to share information with : Family Supports                Emotional Assessment Appearance:: Appears stated age     Orientation: : Oriented to Self, Oriented to Place, Oriented to  Time, Oriented to Situation      Admission diagnosis:  UTI (urinary tract infection)  [N39.0] Urinary tract infection, acute [N39.0] Altered mental status, unspecified altered mental status type [R41.82] Sepsis, due to unspecified organism, unspecified whether acute organ dysfunction present [facility]) [A41.9] Patient Active Problem List   Diagnosis Date Noted   UTI (urinary tract infection) 03/23/2024   Acute focal neurological deficit 12/08/2023   Gluteal abscess 04/27/2023   History of ileal conduit 04/25/2023   Left buttock abscess 04/24/2023   COVID-19 04/24/2023   Decubitus ulcer of sacral region, stage 3 (HCC) 03/16/2023   Cellulitis 03/15/2023   Acute metabolic encephalopathy 01/11/2023   Chronic diastolic CHF (congestive heart failure) (HCC) 01/11/2023   Obesity (BMI 30-39.9) 01/11/2023   Depression 01/11/2023   Constipation 01/11/2023   Primary hypertension 02/07/2022   Pressure injury of skin 12/05/2021   Nausea and vomiting 12/04/2021   Community acquired pneumonia 12/03/2021   Difficult intravenous access 12/03/2021   Weakness of upper extremity 09/03/2021   Sepsis (HCC) 09/02/2021   Paraplegia (HCC) 02/15/2018   Altered mental status 04/10/2017   Lower urinary tract infectious disease 08/09/2015   Multiple sclerosis, primary progressive (HCC) 08/09/2015   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 [place] [address] Phone: [phone] Fax: [phone]  [facility] [address], [place] - [address] [place] [address] Phone: [phone] Fax:  [phone]     Social Drivers of Health (SDOH) Social History: SDOH Screenings   Food Insecurity: No Food Insecurity (04/21/2024)  Housing: Unknown (04/21/2024)  Transportation Needs: No Transportation Needs (04/21/2024)  Utilities: Not At Risk (04/21/2024)  Financial Resource Strain: Low Risk  (01/24/2024)   Received from [facility]  Physical Activity: Inactive (11/03/2022)   Received from [facility]  Social Connections: Moderately Isolated (11/03/2022)   Received [facility]  Stress: No Stress Concern Present (11/03/2022)   Received from [facility]  Tobacco Use: Low Risk  (04/21/2024)  Health Literacy: Low Risk  (04/05/2024)   Received from [facility]   SDOH Interventions:     Readmission Risk Interventions     No data to display

## 2024-04-26 ENCOUNTER — Other Ambulatory Visit: Payer: Self-pay

## 2024-04-26 ENCOUNTER — Observation Stay
Admission: EM | Admit: 2024-04-26 | Discharge: 2024-04-27 | Disposition: A | Discharge status: Home / Self Care | Attending: Hospitalist | Admitting: Hospitalist

## 2024-04-26 ENCOUNTER — Emergency Department

## 2024-04-26 DIAGNOSIS — Z09 Encounter for follow-up examination after completed treatment for conditions other than malignant neoplasm: Principal | ICD-10-CM

## 2024-04-26 DIAGNOSIS — R55 Syncope and collapse: Secondary | ICD-10-CM | POA: Insufficient documentation

## 2024-04-26 DIAGNOSIS — Z8669 Personal history of other diseases of the nervous system and sense organs: Secondary | ICD-10-CM | POA: Insufficient documentation

## 2024-04-26 DIAGNOSIS — G35 Multiple sclerosis: Secondary | ICD-10-CM | POA: Diagnosis not present

## 2024-04-26 DIAGNOSIS — I110 Hypertensive heart disease with heart failure: Secondary | ICD-10-CM | POA: Diagnosis not present

## 2024-04-26 DIAGNOSIS — R079 Chest pain, unspecified: Secondary | ICD-10-CM | POA: Diagnosis not present

## 2024-04-26 DIAGNOSIS — R531 Weakness: Secondary | ICD-10-CM | POA: Diagnosis present

## 2024-04-26 DIAGNOSIS — Z79899 Other long term (current) drug therapy: Secondary | ICD-10-CM | POA: Diagnosis not present

## 2024-04-26 DIAGNOSIS — R52 Pain, unspecified: Secondary | ICD-10-CM | POA: Insufficient documentation

## 2024-04-26 DIAGNOSIS — N319 Neuromuscular dysfunction of bladder, unspecified: Secondary | ICD-10-CM | POA: Diagnosis not present

## 2024-04-26 DIAGNOSIS — K219 Gastro-esophageal reflux disease without esophagitis: Secondary | ICD-10-CM | POA: Insufficient documentation

## 2024-04-26 DIAGNOSIS — I5032 Chronic diastolic (congestive) heart failure: Secondary | ICD-10-CM | POA: Diagnosis not present

## 2024-04-26 DIAGNOSIS — N390 Urinary tract infection, site not specified: Principal | ICD-10-CM | POA: Insufficient documentation

## 2024-04-26 DIAGNOSIS — Z993 Dependence on wheelchair: Secondary | ICD-10-CM | POA: Insufficient documentation

## 2024-04-26 DIAGNOSIS — Z91040 Latex allergy status: Secondary | ICD-10-CM | POA: Diagnosis not present

## 2024-04-26 DIAGNOSIS — R5381 Other malaise: Secondary | ICD-10-CM | POA: Diagnosis not present

## 2024-04-26 DIAGNOSIS — Z936 Other artificial openings of urinary tract status: Secondary | ICD-10-CM | POA: Insufficient documentation

## 2024-04-26 DIAGNOSIS — R6889 Other general symptoms and signs: Secondary | ICD-10-CM | POA: Diagnosis present

## 2024-04-26 LAB — CBC WITH DIFFERENTIAL/PLATELET
Abs Immature Granulocytes: 0.04 K/uL (ref 0.00–0.07)
Basophils Absolute: 0.1 K/uL (ref 0.0–0.1)
Basophils Relative: 1 %
Eosinophils Absolute: 0.6 K/uL — ABNORMAL HIGH (ref 0.0–0.5)
Eosinophils Relative: 5 %
HCT: 36.9 % (ref 36.0–46.0)
Hemoglobin: 12 g/dL (ref 12.0–15.0)
Immature Granulocytes: 0 %
Lymphocytes Relative: 23 %
Lymphs Abs: 2.5 K/uL (ref 0.7–4.0)
MCH: 29.3 pg (ref 26.0–34.0)
MCHC: 32.5 g/dL (ref 30.0–36.0)
MCV: 90.2 fL (ref 80.0–100.0)
Monocytes Absolute: 1.6 K/uL — ABNORMAL HIGH (ref 0.1–1.0)
Monocytes Relative: 14 %
Neutro Abs: 6.1 K/uL (ref 1.7–7.7)
Neutrophils Relative %: 57 %
Platelets: 318 K/uL (ref 150–400)
RBC: 4.09 MIL/uL (ref 3.87–5.11)
RDW: 14.7 % (ref 11.5–15.5)
WBC: 10.9 K/uL — ABNORMAL HIGH (ref 4.0–10.5)
nRBC: 0 % (ref 0.0–0.2)

## 2024-04-26 LAB — URINALYSIS, W/ REFLEX TO CULTURE (INFECTION SUSPECTED)
Bilirubin Urine: NEGATIVE
Glucose, UA: NEGATIVE mg/dL
Ketones, ur: 5 mg/dL — AB
Nitrite: NEGATIVE
Protein, ur: NEGATIVE mg/dL
Specific Gravity, Urine: 1.004 — ABNORMAL LOW (ref 1.005–1.030)
WBC, UA: >50 WBC/hpf (ref 0–5)
pH: 7 (ref 5.0–8.0)

## 2024-04-26 LAB — CULTURE, BLOOD (ROUTINE X 2)
Culture: NO GROWTH
Culture: NO GROWTH
Special Requests: ADEQUATE
Special Requests: ADEQUATE

## 2024-04-26 LAB — COMPREHENSIVE METABOLIC PANEL WITH GFR
ALT: 23 U/L (ref 0–44)
AST: 24 U/L (ref 15–41)
Albumin: 2.8 g/dL — ABNORMAL LOW (ref 3.5–5.0)
Alkaline Phosphatase: 73 U/L (ref 38–126)
Anion gap: 12 (ref 5–15)
BUN: 9 mg/dL (ref 6–20)
CO2: 23 mmol/L (ref 22–32)
Calcium: 8.7 mg/dL — ABNORMAL LOW (ref 8.9–10.3)
Chloride: 102 mmol/L (ref 98–111)
Creatinine, Ser: 0.57 mg/dL (ref 0.44–1.00)
GFR, Estimated: >60 mL/min (ref >60)
Glucose, Bld: 83 mg/dL (ref 70–99)
Potassium: 3.3 mmol/L — ABNORMAL LOW (ref 3.5–5.1)
Sodium: 137 mmol/L (ref 135–145)
Total Bilirubin: 0.9 mg/dL (ref 0.0–1.2)
Total Protein: 7 g/dL (ref 6.5–8.1)

## 2024-04-26 LAB — LACTIC ACID, PLASMA: Lactic Acid, Venous: 0.6 mmol/L (ref 0.5–1.9)

## 2024-04-26 LAB — TROPONIN I (HIGH SENSITIVITY)
Troponin I (High Sensitivity): 4 ng/L (ref <18)
Troponin I (High Sensitivity): 5 ng/L (ref <18)
Troponin I (High Sensitivity): 4 ng/L (ref <18)
Troponin I (High Sensitivity): 5 ng/L (ref <18)

## 2024-04-26 LAB — PROCALCITONIN: Procalcitonin: <0.1 ng/mL

## 2024-04-26 LAB — C-REACTIVE PROTEIN: CRP: 12.6 mg/dL — ABNORMAL HIGH (ref <1.0)

## 2024-04-26 LAB — SEDIMENTATION RATE: Sed Rate: 51 mm/h — ABNORMAL HIGH (ref 0–30)

## 2024-04-26 MED ORDER — GENTAMICIN SULFATE 40 MG/ML IJ SOLN
5.0000 mg/kg | Freq: Once | INTRAVENOUS | Status: AC
  Administered 2024-04-26: 400 mg via INTRAVENOUS
  Filled 2024-04-26: qty 10

## 2024-04-26 MED ORDER — ONDANSETRON HCL 4 MG/2ML IJ SOLN: 4.0000 mg | Freq: Four times a day (QID) | INTRAMUSCULAR | Status: DC | PRN

## 2024-04-26 MED ORDER — ACETAMINOPHEN 650 MG RE SUPP: 650.0000 mg | Freq: Four times a day (QID) | RECTAL | Status: DC | PRN

## 2024-04-26 MED ORDER — BACLOFEN 10 MG PO TABS
20.0000 mg | ORAL_TABLET | Freq: Four times a day (QID) | ORAL | Status: DC
  Administered 2024-04-26 – 2024-04-27 (×2): 20 mg via ORAL
  Filled 2024-04-26 (×3): qty 2

## 2024-04-26 MED ORDER — ONDANSETRON HCL 4 MG PO TABS
4.0000 mg | ORAL_TABLET | Freq: Four times a day (QID) | ORAL | Status: DC | PRN
Start: 2024-04-26 — End: 2024-04-27

## 2024-04-26 MED ORDER — ALBUTEROL SULFATE (2.5 MG/3ML) 0.083% IN NEBU: 2.5000 mg | INHALATION_SOLUTION | RESPIRATORY_TRACT | Status: DC | PRN

## 2024-04-26 MED ORDER — HEPARIN SODIUM (PORCINE) 5000 UNIT/ML IJ SOLN
5000.0000 [IU] | Freq: Three times a day (TID) | INTRAMUSCULAR | Status: DC
  Administered 2024-04-26 – 2024-04-27 (×2): 5000 [IU] via SUBCUTANEOUS
  Filled 2024-04-26 (×3): qty 1

## 2024-04-26 MED ORDER — LACTATED RINGERS IV BOLUS
1000.0000 mL | Freq: Once | INTRAVENOUS | Status: AC
  Administered 2024-04-26: 1000 mL via INTRAVENOUS

## 2024-04-26 MED ORDER — GABAPENTIN 100 MG PO CAPS
200.0000 mg | ORAL_CAPSULE | Freq: Three times a day (TID) | ORAL | Status: DC
  Administered 2024-04-26 – 2024-04-27 (×3): 200 mg via ORAL
  Filled 2024-04-26 (×3): qty 2

## 2024-04-26 MED ORDER — SODIUM CHLORIDE 0.9 % IV SOLN: INTRAVENOUS | Status: DC

## 2024-04-26 MED ORDER — POTASSIUM CHLORIDE 10 MEQ/100ML IV SOLN
10.0000 meq | INTRAVENOUS | Status: AC
  Administered 2024-04-26 (×3): 10 meq via INTRAVENOUS
  Filled 2024-04-26 (×3): qty 100

## 2024-04-26 MED ORDER — ADULT MULTIVITAMIN W/MINERALS CH
1.0000 | ORAL_TABLET | Freq: Every day | ORAL | Status: DC
  Administered 2024-04-26 – 2024-04-27 (×2): 1 via ORAL
  Filled 2024-04-26 (×2): qty 1

## 2024-04-26 MED ORDER — ACETAMINOPHEN 325 MG PO TABS
650.0000 mg | ORAL_TABLET | Freq: Four times a day (QID) | ORAL | Status: DC | PRN
  Administered 2024-04-27: 650 mg via ORAL
  Filled 2024-04-26: qty 2

## 2024-04-26 NOTE — H&P (Signed)
 History and Physical    [name] FMW:[id number] DOB: 02-Sep-1971 DOA: 04/26/2024  PCP: Eino Tinnie BIRCH, MD  Patient coming from: home  I have personally briefly reviewed patient's old medical records in [facility]  Chief Complaint: not feeling well  HPI: [name] is a 53 y.o. female with medical history significant of HTN chronic HFpEF, neuropathic pain, anxiety multiple sclerosis, complicated by left-sided weakness and neurogenic bladder status post ileal conduit, in addition to  history of recurrent MDR UTI . Patient with interim history of recent admission 7/27-7/31 at which time she was treated for UTI and acute metabolic encephalopath,.cultures grew MRSA and Pseudomonas.  Patient was treated with ceftriaxone  followed by ceftazidime  and 1 dose of IV gentamicin .During that hospitalization patient had an MRI of cervical spine and brain which was negative for any contrast-enhancing lesion to suggest acute MS flare. Patient returned to baseline and was discharged home. Patient now returns 1 days s/p discharge with complaint of not feeling well.  She notes no fever/chills/ n/v/d/ sob/chest pain or cough. IN the field patient has stable vitals , but en route has a syncopal episode.  Patient notes she feels tired but otherwise no  complaint. She states she does not remember the syncopal episode.  She notes some mild nausea but is able to eat part of her dinner. She noted no fever/chills/ chest pain/ HA /abdominal pain.    ED Course:  Vitals 99.1 HR 101 BP 142/83hr 62 sat 100%  CE 5 Wbc L 10.9, hgb 12.0, plt 318  Na 137, K 3.3, Cl 102, cr 0.57, AST 24, Alphos 73 EKG:NSR@87  Cxr:NAD UA: wbc >50 , LE -large  rb 11-20 LR1L LR 1L  Review of Systems: As per HPI otherwise 10 point review of systems negative.   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   Edema leg 09/05/2014   History of construction of external  stoma of urinary system 04/29/2015   MS (multiple sclerosis) (HCC) 2005   Multiple sclerosis, 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   Neurogenic bowel 08/12/2012   Neuropathic pain 05/21/2015    Past Surgical History:  Procedure Laterality Date   ABDOMINAL HYSTERECTOMY  2001   BACK SURGERY  2004   REVISION UROSTOMY CUTANEOUS  06/2012     reports that she has never smoked. She has never used smokeless tobacco. She reports that she does not currently use alcohol. She reports that she does not use drugs.  Allergies  Allergen Reactions   Latex Swelling and Other (See Comments)    Reaction:  Facial swelling    Vancomycin  Itching    Family History  Problem Relation Age of Onset   Osteoarthritis Mother    Hypertension Mother    Hyperlipidemia Mother    Thyroid disease Mother    Liver cancer Father    Asthma Brother     Prior to Admission medications   Medication Sig Start Date End Date Taking? Authorizing Provider  acetaminophen  (TYLENOL ) 500 MG tablet Take 500 mg by mouth every 6 (six) hours as needed.   Yes [provider]  amantadine  (SYMMETREL ) 100 MG capsule Take 200 mg by mouth 2 (two) times daily.   Yes [provider]  baclofen  (LIORESAL ) 20 MG tablet Take 20 mg by mouth 4 (four) times daily. 08/31/23  Yes [provider]  bisacodyl  (DULCOLAX) 5 MG EC tablet Take 1 tablet (5 mg total) by mouth daily as needed  for moderate constipation. 03/18/24 03/18/25 Yes Willo Dunnings, MD  DULoxetine  (CYMBALTA ) 60 MG capsule Take 60 mg by mouth daily. 04/05/24 05/05/24 Yes [provider]  gabapentin  (NEURONTIN ) 100 MG capsule Take 200 mg by mouth in the morning, at noon, and at bedtime. 06/17/21  Yes [provider]  gabapentin  (NEURONTIN ) 800 MG tablet Take 800 mg by mouth at bedtime. 07/08/21  Yes [provider]  lactulose  (CHRONULAC ) 10 GM/15ML solution Take 10 g by mouth daily. 01/25/24  Yes [provider]  ondansetron   (ZOFRAN -ODT) 4 MG disintegrating tablet Take 1 tablet (4 mg total) by mouth every 8 (eight) hours as needed. 03/18/24  Yes Claudene Rover, MD  pantoprazole  (PROTONIX ) 40 MG tablet Take 1 tablet by mouth daily. 10/02/23  Yes [provider]  senna (SENOKOT) 8.6 MG tablet Take 2 tablets by mouth 2 (two) times daily. 06/24/22  Yes [provider]  Silver (AQUACEL AG FOAM) [phone] PADS Apply 1 each topically daily in the afternoon. 03/16/23   Wouk, Devaughn Sayres, MD  Wound Dressings (FOAM DRESSING CIRCULAR BORDER) PADS Apply 1 each topically every 3 (three) days. 03/16/23   Kandis Devaughn Sayres, MD    Physical Exam: Vitals:   04/26/24 1215 04/26/24 1230 04/26/24 1245 04/26/24 1339  BP:  (!) 142/83    Pulse: (!) 101  65   Resp: 16 14 20    Temp:    98.4 F (36.9 C)  TempSrc:    Oral  SpO2: 100%  100%   Weight:      Height:        Constitutional: NAD, calm, comfortable Vitals:   04/26/24 1215 04/26/24 1230 04/26/24 1245 04/26/24 1339  BP:  (!) 142/83    Pulse: (!) 101  65   Resp: 16 14 20    Temp:    98.4 F (36.9 C)  TempSrc:    Oral  SpO2: 100%  100%   Weight:      Height:       Eyes: PERRL, lids and conjunctivae normal ENMT: Mucous membranes are moist. Posterior pharynx clear of any exudate or lesions.Normal dentition.  Neck: normal, supple, no masses, no thyromegaly Respiratory: clear to auscultation bilaterally, no wheezing, no crackles. Normal respiratory effort. No accessory muscle use.  Cardiovascular: Regular rate and rhythm, no murmurs / rubs / gallops. Dependent edema.warm ext. Abdomen: no tenderness, no masses palpated. No hepatosplenomegaly. Bowel sounds positive.  Musculoskeletal: no clubbing / cyanosis. No joint deformity upper and lower extremities. Good ROM, no contractures. Normal muscle tone.  Skin: no rashes, lesions, ulcers. No induration Neurologic: CN  grossly intact. Sensation intact, Strength 3-4/5 in all 4.  Psychiatric: Normal judgment and insight.  Alert and oriented x 3. Normal mood.    Labs on Admission: I have personally reviewed following labs and imaging studies  CBC: Recent Labs  Lab 04/21/24 0942 04/22/24 0331 04/23/24 0832 04/25/24 0342 04/26/24 1114  WBC 12.4* 13.6* 12.0* 10.7* 10.9*  NEUTROABS 7.6  --  7.5  --  6.1  HGB 13.4 12.5 11.4* 11.2* 12.0  HCT 39.7 37.8 33.8* 33.9* 36.9  MCV 89.0 89.2 88.3 89.4 90.2  PLT 366 332 302 321 318   Basic Metabolic Panel: Recent Labs  Lab 04/21/24 0942 04/23/24 0832 04/25/24 0342 04/26/24 1114  NA 137 136 140 137  K 3.7 3.3* 3.4* 3.3*  CL 101 102 107 102  CO2 23 23 26 23   GLUCOSE 108* 116* 90 83  BUN 13 10 9 9   CREATININE 0.55  0.53 0.42* 0.57  CALCIUM 9.9 8.9 8.7* 8.7*   GFR: Estimated Creatinine Clearance: 82.2 mL/min (by C-G formula based on SCr of 0.57 mg/dL). Liver Function Tests: Recent Labs  Lab 04/21/24 0942 04/26/24 1114  AST 19 24  ALT 23 23  ALKPHOS 75 73  BILITOT 0.8 0.9  PROT 9.5* 7.0  ALBUMIN 3.9 2.8*   No results for input(s): LIPASE, AMYLASE in the last 168 hours. No results for input(s): AMMONIA in the last 168 hours. Coagulation Profile: Recent Labs  Lab 04/21/24 0942  INR 1.1   Cardiac Enzymes: No results for input(s): CKTOTAL, CKMB, CKMBINDEX, TROPONINI in the last 168 hours. BNP (last 3 results) No results for input(s): PROBNP in the last 8760 hours. HbA1C: No results for input(s): HGBA1C in the last 72 hours. CBG: Recent Labs  Lab 04/24/24 0710  GLUCAP 98   Lipid Profile: No results for input(s): CHOL, HDL, LDLCALC, TRIG, CHOLHDL, LDLDIRECT in the last 72 hours. Thyroid Function Tests: No results for input(s): TSH, T4TOTAL, FREET4, T3FREE, THYROIDAB in the last 72 hours. Anemia Panel: No results for input(s): VITAMINB12, FOLATE, FERRITIN, TIBC, IRON, RETICCTPCT in the last 72 hours. Urine analysis:    Component Value Date/Time   COLORURINE YELLOW (A) 04/26/2024 1247    APPEARANCEUR HAZY (A) 04/26/2024 1247   APPEARANCEUR Cloudy 10/05/2014 2100   LABSPEC 1.004 (L) 04/26/2024 1247   LABSPEC 1.009 10/05/2014 2100   PHURINE 7.0 04/26/2024 1247   GLUCOSEU NEGATIVE 04/26/2024 1247   GLUCOSEU Negative 10/05/2014 2100   HGBUR SMALL (A) 04/26/2024 1247   BILIRUBINUR NEGATIVE 04/26/2024 1247   BILIRUBINUR Negative 10/05/2014 2100   KETONESUR 5 (A) 04/26/2024 1247   PROTEINUR NEGATIVE 04/26/2024 1247   NITRITE NEGATIVE 04/26/2024 1247   LEUKOCYTESUR LARGE (A) 04/26/2024 1247   LEUKOCYTESUR 3+ 10/05/2014 2100    Radiological Exams on Admission: DG Chest 1 View Result Date: 04/26/2024 CLINICAL DATA:  Weakness. EXAM: CHEST  1 VIEW COMPARISON:  04/21/2024. FINDINGS: Low lung volume. Linear area of atelectasis/scarring noted overlying the right lower lung zone, similar to the prior study. Bilateral lung fields are otherwise clear. No acute consolidation or lung collapse. Bilateral costophrenic angles are clear. Normal cardio-mediastinal silhouette. No acute osseous abnormalities. The soft tissues are within normal limits. IMPRESSION: No active disease. Electronically Signed   By: Ree Molt M.D.   On: 04/26/2024 10:47    EKG: Independently reviewed.   Assessment/Plan  Partially treated UTI vs colonization  - patient with discharge 24 hours ago s/p tx for UTI  - pt return with feeling unwell , UA unchanged but this has been the case in the past  difficult to differentiate between colonization and partially treated infection  -s/p gentamicin  in ED base on culture from last admission that noted MRSA as well as psuedomonas  -Discussed case with on call ID , who recommended holding abx from now as wbc has not changed, patient w/o fever.  -will order inflammatory markers to assess baseline   Episode of Syncope  -per noted patient has episode of syncope on en route  - no further episodes  -? Due to orthostasis poor po intake  - will observe on tele  -start  ivfs  -to be complete update echo  -cycle ce  - no new infectious source found cxr negative , RVP negative  -hold baclofen  for now resume in am if no further episodes of syncope  Hx of Multiple Sclerosis - recent MRI noted no signs of flare  -Chronic left  arm extremity weakness -Neurogenic bladder and bowel - Chronically wheelchair-bound   Hypertension  -stable  -resume home regimen   CHFpef -no exacerbation  -not on GDMT -well compensated  GERD -ppi   DVT prophylaxis: heparin  Code Status: full/ as discussed per patient wishes in event of cardiac arrest  Family Communication: none at bedside Disposition Plan: full/ as discussed per patient wishes in event of cardiac arrest  Consults called:  ID , Dr Fayette  Admission status:  med tele   Camila DELENA Ned MD [facility]   If 7PM-7AM, please contact night-coverage [email or web address] Password [facility]  04/26/2024, 2:31 PM

## 2024-04-26 NOTE — ED Provider Notes (Signed)
 [name] Provider Note    Event Date/Time   First MD Initiated Contact with Patient 04/26/24 1024     (approximate)   History   Weakness   HPI  [name] is a 53 y.o. female with history of multiple sclerosis complicated by left-sided weakness, is bedbound, neurogenic bladder post ileal conduit, CHF, neuropathic pain, anxiety, history of MDR UTI, presenting with generalized weakness and bodyaches.  States that she did not feel well, took two 650 mg Tylenol  tabs.  Patient denies any abdominal pain, no nausea vomiting or diarrhea, no recent trauma or falls.  No chest pain or shortness of breath.  Per independent history from EMS, she was afebrile for them, blood glucose was 74, ANO x 4.  Did have a syncopal episode in the truck on the way to the emergency department.  No seizure-like activity.   On independent chart review she was discharged yesterday after being admitted for sepsis secondary to UTI, cultures grew MRSA and Pseudomonas.  During her admission she did have an MRI of her cervical spine and brain which was negative for any contrast-enhancing lesion to suggest acute MS flare.  Was complaining about nonspecific aches and pains during the hospitalization for which she takes gabapentin .  She also had an EEG done during her hospitalization that did not show seizure.   Physical Exam   Triage Vital Signs: ED Triage Vitals  Encounter Vitals Group     BP      Girls Systolic BP Percentile      Girls Diastolic BP Percentile      Boys Systolic BP Percentile      Boys Diastolic BP Percentile      Pulse      Resp      Temp      Temp src      SpO2      Weight      Height      Head Circumference      Peak Flow      Pain Score      Pain Loc      Pain Education      Exclude from Growth Chart     Most recent vital signs: Vitals:   04/26/24 1245 04/26/24 1339  BP:    Pulse: 65   Resp: 20   Temp:  98.4 F (36.9 C)  SpO2: 100%       General: Awake, no distress.  Answering questions appropriately CV:  Good peripheral perfusion.  Resp:  Normal effort.  No tachypnea or respiratory distress Abd:  No distention.  Soft nontender Other:  Has an ileal conduit with cloudy urine, dry mucous membranes.  She has padding on her heels and her sacrum, there is no open wounds.   ED Results / Procedures / Treatments   Labs (all labs ordered are listed, but only abnormal results are displayed) Labs Reviewed  COMPREHENSIVE METABOLIC PANEL WITH GFR - Abnormal; Notable for the following components:      Result Value   Potassium 3.3 (*)    Calcium 8.7 (*)    Albumin 2.8 (*)    All other components within normal limits  CBC WITH DIFFERENTIAL/PLATELET - Abnormal; Notable for the following components:   WBC 10.9 (*)    Monocytes Absolute 1.6 (*)    Eosinophils Absolute 0.6 (*)    All other components within normal limits  URINALYSIS, W/ REFLEX TO CULTURE (INFECTION SUSPECTED) - Abnormal; Notable for the  following components:   Color, Urine YELLOW (*)    APPearance HAZY (*)    Specific Gravity, Urine 1.004 (*)    Hgb urine dipstick SMALL (*)    Ketones, ur 5 (*)    Leukocytes,Ua LARGE (*)    Bacteria, UA RARE (*)    All other components within normal limits  URINE CULTURE  LACTIC ACID, PLASMA  TROPONIN I (HIGH SENSITIVITY)  TROPONIN I (HIGH SENSITIVITY)     EKG  EKG shows, sinus rhythm, rate 87, normal QS, normal QTc, no obvious ischemic ST elevation, T wave flattening in 3, aVF, T wave flattening is new compared to prior   RADIOLOGY On my independent interpretation, chest x-ray without obvious consolidation   PROCEDURES:  Critical Care performed: No  Procedures   MEDICATIONS ORDERED IN ED: Medications  gentamicin  (GARAMYCIN ) 400 mg in dextrose  5 % 100 mL IVPB (has no administration in time range)  lactated ringers  bolus 1,000 mL (0 mLs Intravenous Stopped 04/26/24 1321)     IMPRESSION / MDM /  ASSESSMENT AND PLAN / ED COURSE  I reviewed the triage vital signs and the nursing notes.                              Differential diagnosis includes, but is not limited to, infection such as UTI or pneumonia, arrhythmia, electrolyte derangements, dehydration, atypical ACS.  Will get labs, EKG, troponin, chest x-ray, UA.  Will give some IV fluids here.  Patient's presentation is most consistent with acute presentation with potential threat to life or bodily function.  Independent interpretation of labs and imaging below.  UA is consistent with UTI, given her body aches, weakness, UA findings, she will need to be admitted for further management and IV antibiotics.  On prior urine cultures, he grew Pseudomonas and staph, susceptible to gentamicin , will order IV gentamicin  here.  Consulted hospitalist was agreeable with the plan for admission and will evaluate the patient.  She is admitted.  The patient is on the cardiac monitor to evaluate for evidence of arrhythmia and/or significant heart rate changes.   Clinical Course as of 04/26/24 1355  Fri Apr 26, 2024  1057 DG Chest 1 View No active disease.  [TT]  1329 Independent review of labs, mild leukocytosis, electrolytes not severely deranged, LFTs are normal, troponin and lactate are not elevated. [TT]  1335 Urinalysis, w/ Reflex to Culture (Infection Suspected) -Urine, Catheterized(!) UA is consistent with UTI. [TT]    Clinical Course User Index [TT] Waymond Lorelle Cummins, MD     FINAL CLINICAL IMPRESSION(S) / ED DIAGNOSES   Final diagnoses:  Weakness  Body aches  Urinary tract infection without hematuria, site unspecified  Syncope, unspecified syncope type     Rx / DC Orders   ED Discharge Orders     None        Note:  This document was prepared using Dragon voice recognition software and may include unintentional dictation errors.    Waymond Lorelle Cummins, MD 04/26/24 [id number]

## 2024-04-26 NOTE — ED Notes (Signed)
 Lab came and drew labs for Pt.

## 2024-04-26 NOTE — Consult Note (Signed)
 ED Pharmacy Antibiotic Sign Off An antibiotic consult was received from an ED provider for gentamicin  per pharmacy dosing for UTI. A chart review was completed to assess appropriateness.   The following one time order(s) were placed:  --Gentamicin  400 (5 mg/kg) mg IV  Further antibiotic and/or antibiotic pharmacy consults should be ordered by the admitting provider if indicated.   Thank you for allowing pharmacy to be a part of this patient's care.   Marolyn KATHEE Mare, [facility] 04/26/24 1:52 PM

## 2024-04-26 NOTE — ED Notes (Signed)
 Pt requested home bed sheets be taken off her and sent home with family. This RN and NT removed personal sheets and placed in a personal belongings bag.

## 2024-04-26 NOTE — ED Notes (Signed)
 Lab called to come draw inpatient labs

## 2024-04-26 NOTE — ED Triage Notes (Signed)
 Arrives via EMS from home d/t not feeling wheel d/c for UTI d/c yesterday w/o Abx C/o doesn't feel well upon waking  Pt took 1200mg  Tylenol   Pt cared for at home by her niece  Vitals; 158/73, HR 86, 97% RA, 99.5 oral temp, CBG 74 Pt A/Ox4 w/EMS EMS interventions: none EMS assessment: Syncopal episode, 800 mL out of foley, urostomy

## 2024-04-27 ENCOUNTER — Observation Stay (HOSPITAL_BASED_OUTPATIENT_CLINIC_OR_DEPARTMENT_OTHER)
Admit: 2024-04-27 | Discharge: 2024-04-27 | Disposition: A | Discharge status: Home / Self Care | Attending: Internal Medicine

## 2024-04-27 DIAGNOSIS — R55 Syncope and collapse: Secondary | ICD-10-CM | POA: Diagnosis not present

## 2024-04-27 DIAGNOSIS — R6889 Other general symptoms and signs: Secondary | ICD-10-CM | POA: Diagnosis present

## 2024-04-27 DIAGNOSIS — R079 Chest pain, unspecified: Secondary | ICD-10-CM | POA: Diagnosis not present

## 2024-04-27 LAB — CBC
HCT: 32.5 % — ABNORMAL LOW (ref 36.0–46.0)
Hemoglobin: 10.7 g/dL — ABNORMAL LOW (ref 12.0–15.0)
MCH: 29.6 pg (ref 26.0–34.0)
MCHC: 32.9 g/dL (ref 30.0–36.0)
MCV: 89.8 fL (ref 80.0–100.0)
Platelets: 315 K/uL (ref 150–400)
RBC: 3.62 MIL/uL — ABNORMAL LOW (ref 3.87–5.11)
RDW: 14.6 % (ref 11.5–15.5)
WBC: 10 K/uL (ref 4.0–10.5)
nRBC: 0 % (ref 0.0–0.2)

## 2024-04-27 LAB — ECHOCARDIOGRAM COMPLETE
AR max vel: 2.28 cm2
AV Peak grad: 12 mmHg
Ao pk vel: 1.73 m/s
Area-P 1/2: 5.27 cm2
Height: 64 in
S' Lateral: 2 cm
Weight: 2817.6 [oz_av]

## 2024-04-27 LAB — COMPREHENSIVE METABOLIC PANEL WITH GFR
ALT: 22 U/L (ref 0–44)
AST: 23 U/L (ref 15–41)
Albumin: 2.9 g/dL — ABNORMAL LOW (ref 3.5–5.0)
Alkaline Phosphatase: 68 U/L (ref 38–126)
Anion gap: 11 (ref 5–15)
BUN: 9 mg/dL (ref 6–20)
CO2: 24 mmol/L (ref 22–32)
Calcium: 8.7 mg/dL — ABNORMAL LOW (ref 8.9–10.3)
Chloride: 105 mmol/L (ref 98–111)
Creatinine, Ser: 0.52 mg/dL (ref 0.44–1.00)
GFR, Estimated: >60 mL/min (ref >60)
Glucose, Bld: 96 mg/dL (ref 70–99)
Potassium: 3.5 mmol/L (ref 3.5–5.1)
Sodium: 140 mmol/L (ref 135–145)
Total Bilirubin: 0.6 mg/dL (ref 0.0–1.2)
Total Protein: 7 g/dL (ref 6.5–8.1)

## 2024-04-27 LAB — TROPONIN I (HIGH SENSITIVITY)
Troponin I (High Sensitivity): 5 ng/L (ref <18)
Troponin I (High Sensitivity): 5 ng/L (ref <18)

## 2024-04-27 LAB — RESPIRATORY PANEL BY PCR

## 2024-04-27 MED ORDER — MORPHINE SULFATE (PF) 2 MG/ML IV SOLN: 2.0000 mg | INTRAVENOUS | Status: DC | PRN

## 2024-04-27 MED ORDER — MORPHINE SULFATE (PF) 2 MG/ML IV SOLN
2.0000 mg | INTRAVENOUS | Status: AC | PRN
  Administered 2024-04-27: 2 mg via INTRAVENOUS
  Filled 2024-04-27: qty 1

## 2024-04-27 NOTE — Care Management CC44 (Signed)
 Condition Code 44 Documentation Completed  Patient Details  Name: [name] MRN: [id number] Date of Birth: August 17, 1971   Condition Code 44 given:    Patient signature on Condition Code 44 notice:    Documentation of 2 MD's agreement:    Code 44 added to claim:       [name], LCSW 04/27/2024, 12:35 PM

## 2024-04-27 NOTE — Discharge Summary (Signed)
 Physician Discharge Summary   [name]  female DOB: Sep 26, 1971  FMW:[id number]  PCP: Eino Tinnie BIRCH, MD  Admit date: 04/26/2024 Discharge date: 04/27/2024  Admitted From: home Disposition:  home CODE STATUS: Full code   Hospital Course:  For full details, please see H&P, progress notes, consult notes and ancillary notes.  Briefly,  [name] is a 53 y.o. female with medical history significant of HTN chronic HFpEF, neuropathic pain, anxiety multiple sclerosis, complicated by left-sided weakness and neurogenic bladder status post ileal conduit, in addition to history of recurrent MDR UTI, who presented with complaint of not feeling well.  Patient with interim history of recent admission 7/27-7/31 at which time she was treated for UTI and acute metabolic encephalopath.  Cultures grew MRSA and Pseudomonas.  Patient was treated with ceftriaxone  followed by ceftazidime  and 1 dose of IV gentamicin .  During that hospitalization patient had an MRI of cervical spine and brain which was negative for any contrast-enhancing lesion to suggest acute MS flare. Patient returned to baseline and was discharged home.  Patient returned 1 days after discharge with complaint of not feeling well, not other specific complaint.  Per EMS, pt en route had a syncopal episode.  She stated she does not remember the syncopal episode.    Urinary Colonization  - patient with discharge 24 hours ago s/p tx for UTI  - UA unchanged but this has been the case in the past, difficult to differentiate between colonization and partially treated infection  -s/p gentamicin  in ED -admitter discussed case with on call ID, who recommended holding abx from now as wbc has not changed, patient w/o fever.   Questionable Episode of Syncope  - no further episodes  --tele and Echo unremarkable. - no new infectious source found.  cxr negative, RVP negative   Hx of Multiple Sclerosis - Pt reported feeling unwell  which pt attributed to her MS.  Recent MRI on 04/21/24 noted no signs of flare.  -Chronic left arm extremity weakness.  Neurogenic bladder and bowel.  Chronically wheelchair-bound. --inpatient neuro consult offered, but pt preferred to follow up with her primary neuro as outpatient.   Hypertension  --no antihypertensives on home med list.   CHFpef -no exacerbation  -not on GDMT   GERD -ppi   Discharge Diagnoses:  Principal Problem:   UTI (urinary tract infection) Active Problems:   Feeling unwell   30 Day Unplanned Readmission Risk Score    Flowsheet Row ED to Hosp-Admission (Current) from 04/26/2024 in [facility]  30 Day Unplanned Readmission Risk Score (%) 20.25 Filed at 04/27/2024 1200    This score is the patient's risk of an unplanned readmission within 30 days of being discharged (0 -100%). The score is based on dignosis, age, lab data, medications, orders, and past utilization.   Low:  0-14.9   Medium: 15-21.9   High: 22-29.9   Extreme: 30 and above         Discharge Instructions:  Allergies as of 04/27/2024       Reactions   Latex Swelling, Other (See Comments)   Reaction:  Facial swelling    Vancomycin  Itching        Medication List     TAKE these medications    acetaminophen  500 MG tablet Commonly known as: TYLENOL  Take 500 mg by mouth every 6 (six) hours as needed.   amantadine  100 MG capsule Commonly known as: SYMMETREL  Take 200 mg by mouth 2 (two) times  daily.   Aquacel Ag Foam 6X6 Pads Apply 1 each topically daily in the afternoon.   baclofen  20 MG tablet Commonly known as: LIORESAL  Take 20 mg by mouth 4 (four) times daily.   Dulcolax 5 MG EC tablet Generic drug: bisacodyl  Take 1 tablet (5 mg total) by mouth daily as needed for moderate constipation.   DULoxetine  60 MG capsule Commonly known as: CYMBALTA  Take 60 mg by mouth daily.   Foam Dressing Circular Border Pads Apply 1 each topically every 3 (three)  days.   gabapentin  100 MG capsule Commonly known as: NEURONTIN  Take 200 mg by mouth in the morning, at noon, and at bedtime.   gabapentin  800 MG tablet Commonly known as: NEURONTIN  Take 800 mg by mouth at bedtime.   lactulose  10 GM/15ML solution Commonly known as: CHRONULAC  Take 10 g by mouth daily.   ondansetron  4 MG disintegrating tablet Commonly known as: ZOFRAN -ODT Take 1 tablet (4 mg total) by mouth every 8 (eight) hours as needed.   pantoprazole  40 MG tablet Commonly known as: PROTONIX  Take 1 tablet by mouth daily.   senna 8.6 MG tablet Commonly known as: SENOKOT Take 2 tablets by mouth 2 (two) times daily.         Follow-up Information     Eino Tinnie BIRCH, MD Follow up in 1 week(s).   Specialty: Internal Medicine Contact information: [address] [place] [address] [phone]                 Allergies  Allergen Reactions   Latex Swelling and Other (See Comments)    Reaction:  Facial swelling    Vancomycin  Itching     The results of significant diagnostics from this hospitalization (including imaging, microbiology, ancillary and laboratory) are listed below for reference.   Consultations:   Procedures/Studies: DG Chest 1 View Result Date: 04/26/2024 CLINICAL DATA:  Weakness. EXAM: CHEST  1 VIEW COMPARISON:  04/21/2024. FINDINGS: Low lung volume. Linear area of atelectasis/scarring noted overlying the right lower lung zone, similar to the prior study. Bilateral lung fields are otherwise clear. No acute consolidation or lung collapse. Bilateral costophrenic angles are clear. Normal cardio-mediastinal silhouette. No acute osseous abnormalities. The soft tissues are within normal limits. IMPRESSION: No active disease. Electronically Signed   By: Ree Molt M.D.   On: 04/26/2024 10:47   CT ABDOMEN PELVIS WO CONTRAST Result Date: 04/22/2024 CLINICAL DATA:  Confusion and fever EXAM: CT ABDOMEN AND PELVIS WITHOUT CONTRAST TECHNIQUE:  Multidetector CT imaging of the abdomen and pelvis was performed following the standard protocol without IV contrast. RADIATION DOSE REDUCTION: This exam was performed according to the departmental dose-optimization program which includes automated exposure control, adjustment of the mA and/or kV according to patient size and/or use of iterative reconstruction technique. COMPARISON:  CT abdomen and pelvis dated 03/18/2024 FINDINGS: Lower chest: Bilateral lower lobe subsegmental atelectasis. No pleural effusion or pneumothorax demonstrated. Partially imaged heart size is normal. Hepatobiliary: No focal hepatic lesions. No intra or extrahepatic biliary ductal dilation. Cholelithiasis. Pancreas: No focal lesions or main ductal dilation. Spleen: Normal in size without focal abnormality. Adrenals/Urinary Tract: No adrenal nodules. Duplex left kidney. No suspicious renal masses by noncontrast technique. Multifocal bilateral renal cortical scarring. No hydronephrosis or calculi. Right lower quadrant ileal conduit. 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 Stomach/Bowel: Normal appearance of the stomach. No evidence of bowel wall thickening, distention, or inflammatory changes. Dilated rectum contains large volume stool. Large volume stool throughout the remainder  of the colon. Vascular/Lymphatic: Aortic atherosclerosis. 13 mm left para-aortic lymph node (2:30), previously 8 mm. Reproductive: No adnexal masses. Other: No free fluid, fluid collection, or free air. Musculoskeletal: No acute or abnormal lytic or blastic osseous lesions. Postsurgical changes of L5-S1 fusion. Hardware appears intact. IMPRESSION: 1. Dilated rectum contains large volume stool, which may reflect fecal impaction. Large volume stool throughout the remainder of the colon. 2. Right lower quadrant ileal conduit with similar parastomal hernia containing loops of nondilated small bowel. 3. Cholelithiasis. 4. Interval increase  in size of a 13 mm left para-aortic lymph node, previously 8 mm, likely reactive. 5.  Aortic Atherosclerosis (ICD10-I70.0). Electronically Signed   By: Limin  Xu M.D.   On: 04/22/2024 14:56   EEG adult Result Date: 04/22/2024 Gregg Lek, MD     04/22/2024  2:06 PM Patient Name: MEOSHA CASTANON MRN: [id number] Epilepsy Attending: Lek Gregg Referring Physician/Provider: Date: 04/22/2024 Duration: 30 minutes Patient history: 53 year old with altered mental status. EEG to evaluate for seizure Level of alertness:  lethargic AEDs during EEG study: Gabapentin  Technical aspects: This EEG study was done with scalp electrodes positioned according to the 10-20 International system of electrode placement. Electrical activity was reviewed with band pass filter of 1-70Hz , sensitivity of 7 uV/mm, display speed of 30mm/sec with a 60Hz  notched filter applied as appropriate. EEG data were recorded continuously and digitally stored.  Video monitoring was available and reviewed as appropriate. Description: EEG showed continuous generalized slowing with low voltage and increase myogenic artifact.  Hyperventilation and photic stimulation were not performed.   ABNORMALITY - Continuous slow, generalized IMPRESSION: This study is suggestive of mild diffuse encephalopathy, nonspecific etiology but likely related to sedation, toxic-metabolic etiology. No seizures or epileptiform discharges were seen throughout the recording. Lek Gregg MD Neurology    MR BRAIN W WO CONTRAST Addendum Date: 04/21/2024 ADDENDUM REPORT: 04/21/2024 22:10 ADDENDUM: On further review, approximately 6 mm left frontal convexity meningioma is unchanged. No mass effect. Electronically Signed   By: Gilmore GORMAN Molt M.D.   On: 04/21/2024 22:10   Result Date: 04/21/2024 CLINICAL DATA:  Demyelinating disease new right upper extremity weakness EXAM: MRI HEAD WITHOUT AND WITH CONTRAST TECHNIQUE: Multiplanar, multiecho pulse sequences of the brain and  surrounding structures were obtained without and with intravenous contrast. CONTRAST:  9mL GADAVIST  GADOBUTROL  1 MMOL/ML IV SOLN COMPARISON:  MRI head December 08, 2023. FINDINGS: Brain: Stable predominately periventricular T2/FLAIR hyperintensities the white matter. Similar subtle foci in the brainstem. No new white matter lesions or an abnormal enhancement. No evidence of acute infarct, acute hemorrhage, mass lesion or midline shift. No hydrocephalus. Vascular: Major arterial flow voids are maintained. Skull and upper cervical spine: Normal marrow signal. Sinuses/Orbits: Negative. IMPRESSION: Stable white matter disease, compatible with reported multiple sclerosis. No new lesions or abnormal enhancement. Electronically Signed: By: Gilmore GORMAN Molt M.D. On: 04/21/2024 22:01   MR CERVICAL SPINE W WO CONTRAST Result Date: 04/21/2024 CLINICAL DATA:  Demyelinating disease new RUE weakness EXAM: MRI CERVICAL SPINE WITHOUT AND WITH CONTRAST TECHNIQUE: Multiplanar and multiecho pulse sequences of the cervical spine, to include the craniocervical junction and cervicothoracic junction, were obtained without and with intravenous contrast. CONTRAST:  9mL GADAVIST  GADOBUTROL  1 MMOL/ML IV SOLN COMPARISON:  MRI cervical spine April 17, 24. FINDINGS: Alignment: No substantial sagittal subluxation. Vertebrae: No fracture, evidence of discitis, or bone lesion. Cord: No convincing cord signal abnormality. No abnormal enhancement. Posterior Fossa, vertebral arteries, paraspinal tissues: Negative. Disc levels: C2-C3: Posterior disc osteophyte complex without  significant stenosis. C3-C4: Posterior disc osteophyte complex and bilateral facet and uncovertebral hypertrophy. Similar mild right and moderate left foraminal stenosis. Mild canal stenosis. C4-C5: Posterior disc osteophyte complex with bilateral facet uncovertebral hypertrophy. Mild bilateral foraminal stenosis. No significant canal stenosis. C5-C6: Facet arthropathy without  significant stenosis. Small disc bulge. C6-C7: Small posterior disc osteophyte complex without significant stenosis. C7-T1: No significant disc protrusion, foraminal stenosis, or canal stenosis. IMPRESSION: 1. No convincing cord signal abnormality or abnormal enhancement. 2. Similar multilevel degenerative change, greatest at C3-C4 where there is moderate left and mild right foraminal stenosis and mild canal stenosis. Electronically Signed   By: Gilmore GORMAN Molt M.D.   On: 04/21/2024 22:09   CT HEAD CODE STROKE WO CONTRAST` Result Date: 04/21/2024 CLINICAL DATA:  Code stroke. Neuro deficit, acute, stroke suspected. Confusion and fever. History of multiple sclerosis. EXAM: CT HEAD WITHOUT CONTRAST TECHNIQUE: Contiguous axial images were obtained from the base of the skull through the vertex without intravenous contrast. RADIATION DOSE REDUCTION: This exam was performed according to the departmental dose-optimization program which includes automated exposure control, adjustment of the mA and/or kV according to patient size and/or use of iterative reconstruction technique. COMPARISON:  Head CT and MRI 12/08/2023 FINDINGS: Brain: There is no evidence of an acute infarct, intracranial hemorrhage, mass, midline shift, or extra-axial fluid collection. Cerebral volume is normal. The ventricles are normal in size. Known chronic demyelinating disease involving the cerebral white matter on MRI is not was shown by CT. Vascular: No hyperdense vessel. Skull: No fracture or suspicious lesion. Sinuses/Orbits: Visualized paranasal sinuses and mastoid air cells are clear. Unremarkable orbits. Other: None. ASPECTS (Alberta Stroke Program Early CT Score) - Ganglionic level infarction (caudate, lentiform nuclei, internal capsule, insula, M1-M3 cortex): 7 - Supraganglionic infarction (M4-M6 cortex): 3 Total score (0-10 with 10 being normal): 10 These results were communicated to Dr. Cort Mana at 6:37 pm on 04/21/2024 by text page via  the [facility] messaging system. IMPRESSION: No evidence of acute intracranial abnormality. ASPECTS of 10. Electronically Signed   By: Dasie Hamburg M.D.   On: 04/21/2024 18:37   DG Chest Port 1 View if patient is in a treatment room. Result Date: 04/21/2024 CLINICAL DATA:  Suspected sepsis. EXAM: PORTABLE CHEST 1 VIEW COMPARISON:  Chest radiograph dated 05/02/2023. FINDINGS: Linear right lung base atelectasis/scarring. No focal consolidation, pleural effusion or pneumothorax. The cardiac silhouette is within limits. No acute osseous pathology. IMPRESSION: No active disease. Electronically Signed   By: Vanetta Chou M.D.   On: 04/21/2024 10:43      Labs: BNP (last 3 results) No results for input(s): BNP in the last 8760 hours. Basic Metabolic Panel: Recent Labs  Lab 04/21/24 0942 04/23/24 0832 04/25/24 0342 04/26/24 1114 04/27/24 0521  NA 137 136 140 137 140  K 3.7 3.3* 3.4* 3.3* 3.5  CL 101 102 107 102 105  CO2 23 23 26 23 24   GLUCOSE 108* 116* 90 83 96  BUN 13 10 9 9 9   CREATININE 0.55 0.53 0.42* 0.57 0.52  CALCIUM 9.9 8.9 8.7* 8.7* 8.7*   Liver Function Tests: Recent Labs  Lab 04/21/24 0942 04/26/24 1114 04/27/24 0521  AST 19 24 23   ALT 23 23 22   ALKPHOS 75 73 68  BILITOT 0.8 0.9 0.6  PROT 9.5* 7.0 7.0  ALBUMIN 3.9 2.8* 2.9*   No results for input(s): LIPASE, AMYLASE in the last 168 hours. No results for input(s): AMMONIA in the last 168 hours. CBC: Recent Labs  Lab 04/21/24  9057 04/22/24 0331 04/23/24 0832 04/25/24 0342 04/26/24 1114 04/27/24 0521  WBC 12.4* 13.6* 12.0* 10.7* 10.9* 10.0  NEUTROABS 7.6  --  7.5  --  6.1  --   HGB 13.4 12.5 11.4* 11.2* 12.0 10.7*  HCT 39.7 37.8 33.8* 33.9* 36.9 32.5*  MCV 89.0 89.2 88.3 89.4 90.2 89.8  PLT 366 332 302 321 318 315   Cardiac Enzymes: No results for input(s): CKTOTAL, CKMB, CKMBINDEX, TROPONINI in the last 168 hours. BNP: Invalid input(s): POCBNP CBG: Recent Labs  Lab 04/24/24 0710   GLUCAP 98   D-Dimer No results for input(s): DDIMER in the last 72 hours. Hgb A1c No results for input(s): HGBA1C in the last 72 hours. Lipid Profile No results for input(s): CHOL, HDL, LDLCALC, TRIG, CHOLHDL, LDLDIRECT in the last 72 hours. Thyroid function studies No results for input(s): TSH, T4TOTAL, T3FREE, THYROIDAB in the last 72 hours.  Invalid input(s): FREET3 Anemia work up No results for input(s): VITAMINB12, FOLATE, FERRITIN, TIBC, IRON, RETICCTPCT in the last 72 hours. Urinalysis    Component Value Date/Time   COLORURINE YELLOW (A) 04/26/2024 1247   APPEARANCEUR HAZY (A) 04/26/2024 1247   APPEARANCEUR Cloudy 10/05/2014 2100   LABSPEC 1.004 (L) 04/26/2024 1247   LABSPEC 1.009 10/05/2014 2100   PHURINE 7.0 04/26/2024 1247   GLUCOSEU NEGATIVE 04/26/2024 1247   GLUCOSEU Negative 10/05/2014 2100   HGBUR SMALL (A) 04/26/2024 1247   BILIRUBINUR NEGATIVE 04/26/2024 1247   BILIRUBINUR Negative 10/05/2014 2100   KETONESUR 5 (A) 04/26/2024 1247   PROTEINUR NEGATIVE 04/26/2024 1247   NITRITE NEGATIVE 04/26/2024 1247   LEUKOCYTESUR LARGE (A) 04/26/2024 1247   LEUKOCYTESUR 3+ 10/05/2014 2100   Sepsis Labs Recent Labs  Lab 04/23/24 0832 04/25/24 0342 04/26/24 1114 04/27/24 0521  WBC 12.0* 10.7* 10.9* 10.0   Microbiology Recent Results (from the past 240 hours)  Culture, blood (Routine x 2)     Status: None   Collection Time: 04/21/24  9:42 AM   Specimen: BLOOD  Result Value Ref Range Status   Specimen Description BLOOD BLOOD LEFT HAND  Final   Special Requests   Final    BOTTLES DRAWN AEROBIC AND ANAEROBIC Blood Culture adequate volume   Culture   Final    NO GROWTH 5 DAYS Performed at [facility], [address] [place], [place] [address]    Report Status 04/26/2024 FINAL  Final  Urine Culture     Status: Abnormal   Collection Time: 04/21/24  9:42 AM   Specimen: Urine, Random  Result Value Ref Range Status    Specimen Description   Final    URINE, RANDOM Performed at [facility], [address] [place], [place] [address]    Special Requests   Final    NONE Reflexed from [id number] Performed at [facility], [address] [place], [place] [address]    Culture (A)  Final    >=100,000 COLONIES/mL PSEUDOMONAS AERUGINOSA 30,000 COLONIES/mL STAPHYLOCOCCUS AUREUS METHICILLIN RESISTANT STAPHYLOCOCCUS AUREUS    Report Status 04/24/2024 FINAL  Final   Organism ID, Bacteria PSEUDOMONAS AERUGINOSA (A)  Final   Organism ID, Bacteria STAPHYLOCOCCUS AUREUS (A)  Final      Susceptibility   Pseudomonas aeruginosa - MIC*    CEFTAZIDIME  16 INTERMEDIATE Intermediate     CIPROFLOXACIN  1 INTERMEDIATE Intermediate     GENTAMICIN  2 SENSITIVE Sensitive     IMIPENEM 2 SENSITIVE Sensitive     * >=100,000 COLONIES/mL PSEUDOMONAS AERUGINOSA   Staphylococcus aureus - MIC*  CIPROFLOXACIN  >=8 RESISTANT Resistant     GENTAMICIN  <=0.5 SENSITIVE Sensitive     NITROFURANTOIN <=16 SENSITIVE Sensitive     OXACILLIN >=4 RESISTANT Resistant     TETRACYCLINE >=16 RESISTANT Resistant     VANCOMYCIN  <=0.5 SENSITIVE Sensitive     TRIMETH /SULFA  >=320 RESISTANT Resistant     RIFAMPIN <=0.5 SENSITIVE Sensitive     Inducible Clindamycin NEGATIVE Sensitive     LINEZOLID 2 SENSITIVE Sensitive     * 30,000 COLONIES/mL STAPHYLOCOCCUS AUREUS  Culture, blood (Routine x 2)     Status: None   Collection Time: 04/21/24  9:47 AM   Specimen: BLOOD  Result Value Ref Range Status   Specimen Description BLOOD BLOOD RIGHT HAND  Final   Special Requests   Final    BOTTLES DRAWN AEROBIC AND ANAEROBIC Blood Culture adequate volume   Culture   Final    NO GROWTH 5 DAYS Performed at [facility], [address] [place], [place] [address]    Report Status 04/26/2024 FINAL  Final  Resp panel by RT-PCR (RSV, Flu A&B, Covid) Anterior Nasal Swab     Status: None   Collection Time: 04/21/24 10:00 AM    Specimen: Anterior Nasal Swab  Result Value Ref Range Status   SARS Coronavirus 2 by RT PCR NEGATIVE NEGATIVE Final    Comment: (NOTE) SARS-CoV-2 target nucleic acids are NOT DETECTED.  The SARS-CoV-2 RNA is generally detectable in upper respiratory specimens during the acute phase of infection. The lowest concentration of SARS-CoV-2 viral copies this assay can detect is 138 copies/mL. A negative result does not preclude SARS-Cov-2 infection and should not be used as the sole basis for treatment or other patient management decisions. A negative result may occur with  improper specimen collection/handling, submission of specimen other than nasopharyngeal swab, presence of viral mutation(s) within the areas targeted by this assay, and inadequate number of viral copies(<138 copies/mL). A negative result must be combined with clinical observations, patient history, and epidemiological information. The expected result is Negative.  Fact Sheet for Patients:  BloggerCourse.com  Fact Sheet for Healthcare Providers:  SeriousBroker.it  This test is no t yet approved or cleared by the [place]  FDA and  has been authorized for detection and/or diagnosis of SARS-CoV-2 by FDA under an Emergency Use Authorization (EUA). This EUA will remain  in effect (meaning this test can be used) for the duration of the COVID-19 declaration under Section 564(b)(1) of the Act, 21 U.S.C.section 360bbb-3(b)(1), unless the authorization is terminated  or revoked sooner.       Influenza A by PCR NEGATIVE NEGATIVE Final   Influenza B by PCR NEGATIVE NEGATIVE Final    Comment: (NOTE) The Xpert Xpress SARS-CoV-2/FLU/RSV plus assay is intended as an aid in the diagnosis of influenza from Nasopharyngeal swab specimens and should not be used as a sole basis for treatment. Nasal washings and aspirates are unacceptable for Xpert Xpress  SARS-CoV-2/FLU/RSV testing.  Fact Sheet for Patients: BloggerCourse.com  Fact Sheet for Healthcare Providers: SeriousBroker.it  This test is not yet approved or cleared by the [place]  FDA and has been authorized for detection and/or diagnosis of SARS-CoV-2 by FDA under an Emergency Use Authorization (EUA). This EUA will remain in effect (meaning this test can be used) for the duration of the COVID-19 declaration under Section 564(b)(1) of the Act, 21 U.S.C. section 360bbb-3(b)(1), unless the authorization is terminated or revoked.     Resp Syncytial Virus by PCR NEGATIVE NEGATIVE Final  Comment: (NOTE) Fact Sheet for Patients: BloggerCourse.com  Fact Sheet for Healthcare Providers: SeriousBroker.it  This test is not yet approved or cleared by the [place]  FDA and has been authorized for detection and/or diagnosis of SARS-CoV-2 by FDA under an Emergency Use Authorization (EUA). This EUA will remain in effect (meaning this test can be used) for the duration of the COVID-19 declaration under Section 564(b)(1) of the Act, 21 U.S.C. section 360bbb-3(b)(1), unless the authorization is terminated or revoked.  Performed at [facility], [address] [place], [place] [address]   Respiratory (~20 pathogens) panel by PCR     Status: None   Collection Time: 04/26/24  9:37 PM   Specimen: Nasopharyngeal Swab; Respiratory  Result Value Ref Range Status   Adenovirus NOT DETECTED NOT DETECTED Final   Coronavirus 229E NOT DETECTED NOT DETECTED Final    Comment: (NOTE) The Coronavirus on the Respiratory Panel, DOES NOT test for the novel  Coronavirus (2019 nCoV)    Coronavirus HKU1 NOT DETECTED NOT DETECTED Final   Coronavirus NL63 NOT DETECTED NOT DETECTED Final   Coronavirus OC43 NOT DETECTED NOT DETECTED Final   Metapneumovirus NOT DETECTED NOT DETECTED Final    Rhinovirus / Enterovirus NOT DETECTED NOT DETECTED Final   Influenza A NOT DETECTED NOT DETECTED Final   Influenza B NOT DETECTED NOT DETECTED Final   Parainfluenza Virus 1 NOT DETECTED NOT DETECTED Final   Parainfluenza Virus 2 NOT DETECTED NOT DETECTED Final   Parainfluenza Virus 3 NOT DETECTED NOT DETECTED Final   Parainfluenza Virus 4 NOT DETECTED NOT DETECTED Final   Respiratory Syncytial Virus NOT DETECTED NOT DETECTED Final   Bordetella pertussis NOT DETECTED NOT DETECTED Final   Bordetella Parapertussis NOT DETECTED NOT DETECTED Final   Chlamydophila pneumoniae NOT DETECTED NOT DETECTED Final   Mycoplasma pneumoniae NOT DETECTED NOT DETECTED Final    Comment: Performed at [facility], [address], [place], [place] [address]     Total time spend on discharging this patient, including the last patient exam, discussing the hospital stay, instructions for ongoing care as it relates to all pertinent caregivers, as well as preparing the medical discharge records, prescriptions, and/or referrals as applicable, is 35 minutes.    Ellouise Haber, MD  [facility] [facility]ists 04/27/2024, 12:48 PM

## 2024-04-27 NOTE — Care Management Obs Status (Signed)
 MEDICARE OBSERVATION STATUS NOTIFICATION   Patient Details  Name: [name] MRN: [id number] Date of Birth: 1971-04-01   Medicare Observation Status Notification Given:       [place] L Jozy Mcphearson, LCSW 04/27/2024, 12:35 PM

## 2024-04-27 NOTE — Progress Notes (Signed)
  Echocardiogram 2D Echocardiogram has been performed.  Thedora GORMAN Louder 04/27/2024, 2:00 PM

## 2024-04-27 NOTE — Plan of Care (Signed)
  Problem: Education: Goal: Knowledge of General Education information will improve Description: Including pain rating scale, medication(s)/side effects and non-pharmacologic comfort measures Outcome: Progressing   Problem: Health Behavior/Discharge Planning: Goal: Ability to manage health-related needs will improve Outcome: Progressing   Problem: Clinical Measurements: Goal: Ability to maintain clinical measurements within normal limits will improve Outcome: Progressing Goal: Will remain free from infection Outcome: Progressing Goal: Diagnostic test results will improve Outcome: Progressing Goal: Cardiovascular complication will be avoided Outcome: Progressing   Problem: Elimination: Goal: Will not experience complications related to bowel motility Outcome: Progressing

## 2024-04-27 NOTE — Progress Notes (Signed)
       CROSS COVER NOTE  NAME: [name] MRN: [id number] DOB : [birth year]    Concern as stated by nurse / staff   Ada Christopher [phone] female admitted for UTI, patient complain of left sided throbbing chest pain 9/10 on pain scale and left arm pain. vitals normal, troponin result was 5 taken last night. EKG was done looks normal you can check it on result.      Pertinent findings on chart review:   Patient Assessment   Assessment and  Interventions   Assessment:  Chest pain  Plan: Troponin ordered Morphine  prn pain X

## 2024-04-28 LAB — URINE CULTURE: Culture: >=100000 — AB

## 2024-04-29 LAB — URINE CULTURE: Culture: 80000 — AB

## 2024-04-30 NOTE — Telephone Encounter (Signed)
 Copied from CRM #[id number]. Topic: Access To Clinicians - Order Question >> Apr 30, 2024  1:42 PM Bria J wrote: The PAC has received an incoming call regarding a request for a verbal order ROUTE TO PROVIDER, NOT NURSING POOL  Type of order requested (PT, OT, Speech, Home Health Nurse): Nursing  Length of service:one week Frequency of service:4 weeks Other instructions or special requests from caller: would like to add PT as well Return call to:  Name: Jewel Phone number: [phone]  Jewel states it is not a secured line to lvm Agency number: [phone]

## 2024-05-02 ENCOUNTER — Other Ambulatory Visit: Payer: Self-pay

## 2024-05-02 ENCOUNTER — Emergency Department

## 2024-05-02 ENCOUNTER — Inpatient Hospital Stay
Admission: EM | Admit: 2024-05-02 | Discharge: 2024-05-10 | DRG: 698 | Disposition: A | Discharge status: Home / Self Care | Attending: Student in an Organized Health Care Education/Training Program | Admitting: Student in an Organized Health Care Education/Training Program

## 2024-05-02 DIAGNOSIS — Z7189 Other specified counseling: Secondary | ICD-10-CM | POA: Diagnosis not present

## 2024-05-02 DIAGNOSIS — G9349 Other encephalopathy: Secondary | ICD-10-CM | POA: Diagnosis not present

## 2024-05-02 DIAGNOSIS — G8220 Paraplegia, unspecified: Secondary | ICD-10-CM | POA: Diagnosis present

## 2024-05-02 DIAGNOSIS — F419 Anxiety disorder, unspecified: Secondary | ICD-10-CM | POA: Diagnosis present

## 2024-05-02 DIAGNOSIS — F32A Depression, unspecified: Secondary | ICD-10-CM | POA: Diagnosis present

## 2024-05-02 DIAGNOSIS — N390 Urinary tract infection, site not specified: Secondary | ICD-10-CM | POA: Diagnosis present

## 2024-05-02 DIAGNOSIS — E860 Dehydration: Secondary | ICD-10-CM | POA: Diagnosis present

## 2024-05-02 DIAGNOSIS — Z515 Encounter for palliative care: Secondary | ICD-10-CM | POA: Diagnosis not present

## 2024-05-02 DIAGNOSIS — N319 Neuromuscular dysfunction of bladder, unspecified: Secondary | ICD-10-CM | POA: Diagnosis present

## 2024-05-02 DIAGNOSIS — T83510A Infection and inflammatory reaction due to cystostomy catheter, initial encounter: Secondary | ICD-10-CM | POA: Diagnosis present

## 2024-05-02 DIAGNOSIS — Z91040 Latex allergy status: Secondary | ICD-10-CM | POA: Diagnosis not present

## 2024-05-02 DIAGNOSIS — Z83438 Family history of other disorder of lipoprotein metabolism and other lipidemia: Secondary | ICD-10-CM | POA: Diagnosis not present

## 2024-05-02 DIAGNOSIS — Z881 Allergy status to other antibiotic agents status: Secondary | ICD-10-CM | POA: Diagnosis not present

## 2024-05-02 DIAGNOSIS — I110 Hypertensive heart disease with heart failure: Secondary | ICD-10-CM | POA: Diagnosis present

## 2024-05-02 DIAGNOSIS — Z993 Dependence on wheelchair: Secondary | ICD-10-CM | POA: Diagnosis not present

## 2024-05-02 DIAGNOSIS — Z6827 Body mass index (BMI) 27.0-27.9, adult: Secondary | ICD-10-CM

## 2024-05-02 DIAGNOSIS — Z8349 Family history of other endocrine, nutritional and metabolic diseases: Secondary | ICD-10-CM | POA: Diagnosis not present

## 2024-05-02 DIAGNOSIS — R4182 Altered mental status, unspecified: Principal | ICD-10-CM

## 2024-05-02 DIAGNOSIS — G9341 Metabolic encephalopathy: Secondary | ICD-10-CM | POA: Diagnosis present

## 2024-05-02 DIAGNOSIS — Z8249 Family history of ischemic heart disease and other diseases of the circulatory system: Secondary | ICD-10-CM

## 2024-05-02 DIAGNOSIS — E663 Overweight: Secondary | ICD-10-CM | POA: Diagnosis present

## 2024-05-02 DIAGNOSIS — Y846 Urinary catheterization as the cause of abnormal reaction of the patient, or of later complication, without mention of misadventure at the time of the procedure: Secondary | ICD-10-CM | POA: Diagnosis present

## 2024-05-02 DIAGNOSIS — K5900 Constipation, unspecified: Secondary | ICD-10-CM | POA: Diagnosis present

## 2024-05-02 DIAGNOSIS — G928 Other toxic encephalopathy: Secondary | ICD-10-CM | POA: Diagnosis not present

## 2024-05-02 DIAGNOSIS — E876 Hypokalemia: Secondary | ICD-10-CM | POA: Diagnosis present

## 2024-05-02 DIAGNOSIS — Z7401 Bed confinement status: Secondary | ICD-10-CM | POA: Diagnosis not present

## 2024-05-02 DIAGNOSIS — I5032 Chronic diastolic (congestive) heart failure: Secondary | ICD-10-CM | POA: Diagnosis present

## 2024-05-02 DIAGNOSIS — G35 Multiple sclerosis: Secondary | ICD-10-CM | POA: Diagnosis present

## 2024-05-02 DIAGNOSIS — I10 Essential (primary) hypertension: Secondary | ICD-10-CM | POA: Diagnosis not present

## 2024-05-02 DIAGNOSIS — G9340 Encephalopathy, unspecified: Secondary | ICD-10-CM | POA: Diagnosis present

## 2024-05-02 DIAGNOSIS — R569 Unspecified convulsions: Secondary | ICD-10-CM

## 2024-05-02 DIAGNOSIS — Z90710 Acquired absence of both cervix and uterus: Secondary | ICD-10-CM

## 2024-05-02 DIAGNOSIS — R000 Tachycardia, unspecified: Secondary | ICD-10-CM | POA: Diagnosis present

## 2024-05-02 DIAGNOSIS — M436 Torticollis: Secondary | ICD-10-CM | POA: Diagnosis not present

## 2024-05-02 DIAGNOSIS — J9811 Atelectasis: Secondary | ICD-10-CM | POA: Diagnosis present

## 2024-05-02 DIAGNOSIS — Z1624 Resistance to multiple antibiotics: Secondary | ICD-10-CM | POA: Diagnosis present

## 2024-05-02 DIAGNOSIS — Z825 Family history of asthma and other chronic lower respiratory diseases: Secondary | ICD-10-CM

## 2024-05-02 DIAGNOSIS — Z800 Family history of malignant neoplasm of digestive organs: Secondary | ICD-10-CM

## 2024-05-02 DIAGNOSIS — R638 Other symptoms and signs concerning food and fluid intake: Secondary | ICD-10-CM | POA: Diagnosis not present

## 2024-05-02 DIAGNOSIS — B952 Enterococcus as the cause of diseases classified elsewhere: Secondary | ICD-10-CM | POA: Diagnosis present

## 2024-05-02 DIAGNOSIS — Z79899 Other long term (current) drug therapy: Secondary | ICD-10-CM

## 2024-05-02 DIAGNOSIS — R5381 Other malaise: Secondary | ICD-10-CM | POA: Diagnosis not present

## 2024-05-02 LAB — CBC WITH DIFFERENTIAL/PLATELET
Abs Immature Granulocytes: 0.11 K/uL — ABNORMAL HIGH (ref 0.00–0.07)
Basophils Absolute: 0.1 K/uL (ref 0.0–0.1)
Basophils Relative: 0 %
Eosinophils Absolute: 1.2 K/uL — ABNORMAL HIGH (ref 0.0–0.5)
Eosinophils Relative: 8 %
HCT: 37.8 % (ref 36.0–46.0)
Hemoglobin: 12.3 g/dL (ref 12.0–15.0)
Immature Granulocytes: 1 %
Lymphocytes Relative: 16 %
Lymphs Abs: 2.4 K/uL (ref 0.7–4.0)
MCH: 29.4 pg (ref 26.0–34.0)
MCHC: 32.5 g/dL (ref 30.0–36.0)
MCV: 90.2 fL (ref 80.0–100.0)
Monocytes Absolute: 1.8 K/uL — ABNORMAL HIGH (ref 0.1–1.0)
Monocytes Relative: 12 %
Neutro Abs: 9.7 K/uL — ABNORMAL HIGH (ref 1.7–7.7)
Neutrophils Relative %: 63 %
Platelets: 510 K/uL — ABNORMAL HIGH (ref 150–400)
RBC: 4.19 MIL/uL (ref 3.87–5.11)
RDW: 15 % (ref 11.5–15.5)
WBC: 15.2 K/uL — ABNORMAL HIGH (ref 4.0–10.5)
nRBC: 0 % (ref 0.0–0.2)

## 2024-05-02 LAB — URINALYSIS, COMPLETE (UACMP) WITH MICROSCOPIC
Bilirubin Urine: NEGATIVE
Glucose, UA: NEGATIVE mg/dL
Hgb urine dipstick: NEGATIVE
Ketones, ur: NEGATIVE mg/dL
Nitrite: NEGATIVE
Protein, ur: NEGATIVE mg/dL
Specific Gravity, Urine: 1.005 (ref 1.005–1.030)
Squamous Epithelial / HPF: 0 /HPF (ref 0–5)
WBC, UA: >50 WBC/hpf (ref 0–5)
pH: 7 (ref 5.0–8.0)

## 2024-05-02 LAB — URINE DRUG SCREEN, QUALITATIVE (ARMC ONLY)
Amphetamines, Ur Screen: NOT DETECTED
Barbiturates, Ur Screen: NOT DETECTED
Benzodiazepine, Ur Scrn: NOT DETECTED
Cannabinoid 50 Ng, Ur ~~LOC~~: NOT DETECTED
Cocaine Metabolite,Ur ~~LOC~~: NOT DETECTED
MDMA (Ecstasy)Ur Screen: NOT DETECTED
Methadone Scn, Ur: NOT DETECTED
Opiate, Ur Screen: NOT DETECTED
Phencyclidine (PCP) Ur S: NOT DETECTED
Tricyclic, Ur Screen: NOT DETECTED

## 2024-05-02 LAB — BLOOD GAS, VENOUS
Acid-Base Excess: 4 mmol/L — ABNORMAL HIGH (ref 0.0–2.0)
Bicarbonate: 29.2 mmol/L — ABNORMAL HIGH (ref 20.0–28.0)
O2 Saturation: 99 %
Patient temperature: 37
pCO2, Ven: 45 mmHg (ref 44–60)
pH, Ven: 7.42 (ref 7.25–7.43)
pO2, Ven: 97 mmHg — ABNORMAL HIGH (ref 32–45)

## 2024-05-02 LAB — COMPREHENSIVE METABOLIC PANEL WITH GFR
ALT: 21 U/L (ref 0–44)
AST: 15 U/L (ref 15–41)
Albumin: 3.2 g/dL — ABNORMAL LOW (ref 3.5–5.0)
Alkaline Phosphatase: 79 U/L (ref 38–126)
Anion gap: 11 (ref 5–15)
BUN: 20 mg/dL (ref 6–20)
CO2: 26 mmol/L (ref 22–32)
Calcium: 9.2 mg/dL (ref 8.9–10.3)
Chloride: 102 mmol/L (ref 98–111)
Creatinine, Ser: 0.98 mg/dL (ref 0.44–1.00)
GFR, Estimated: >60 mL/min (ref >60)
Glucose, Bld: 94 mg/dL (ref 70–99)
Potassium: 4 mmol/L (ref 3.5–5.1)
Sodium: 139 mmol/L (ref 135–145)
Total Bilirubin: 0.9 mg/dL (ref 0.0–1.2)
Total Protein: 8.1 g/dL (ref 6.5–8.1)

## 2024-05-02 LAB — ETHANOL: Alcohol, Ethyl (B): <15 mg/dL (ref <15)

## 2024-05-02 LAB — AMMONIA: Ammonia: <13 umol/L (ref 9–35)

## 2024-05-02 MED ORDER — NALOXONE HCL 2 MG/2ML IJ SOSY
1.0000 mg | PREFILLED_SYRINGE | Freq: Once | INTRAMUSCULAR | Status: AC
  Administered 2024-05-02: 1 mg via INTRAVENOUS
  Filled 2024-05-02: qty 2

## 2024-05-02 MED ORDER — GABAPENTIN 100 MG PO CAPS
200.0000 mg | ORAL_CAPSULE | Freq: Three times a day (TID) | ORAL | Status: DC
  Administered 2024-05-02: 200 mg via ORAL

## 2024-05-02 MED ORDER — PANTOPRAZOLE SODIUM 40 MG PO TBEC
40.0000 mg | DELAYED_RELEASE_TABLET | Freq: Every day | ORAL | Status: DC
  Administered 2024-05-04 – 2024-05-10 (×10): 40 mg via ORAL
  Filled 2024-05-02 (×8): qty 1

## 2024-05-02 MED ORDER — ONDANSETRON HCL 4 MG/2ML IJ SOLN: 4.0000 mg | Freq: Four times a day (QID) | INTRAMUSCULAR | Status: DC | PRN

## 2024-05-02 MED ORDER — BACLOFEN 10 MG PO TABS
20.0000 mg | ORAL_TABLET | Freq: Four times a day (QID) | ORAL | Status: DC
  Administered 2024-05-02 – 2024-05-08 (×22): 20 mg via ORAL
  Filled 2024-05-02 (×18): qty 2

## 2024-05-02 MED ORDER — ENOXAPARIN SODIUM 40 MG/0.4ML IJ SOSY
40.0000 mg | PREFILLED_SYRINGE | INTRAMUSCULAR | Status: DC
  Administered 2024-05-02 – 2024-05-09 (×11): 40 mg via SUBCUTANEOUS
  Filled 2024-05-02 (×8): qty 0.4

## 2024-05-02 MED ORDER — ACETAMINOPHEN 650 MG RE SUPP: 650.0000 mg | Freq: Four times a day (QID) | RECTAL | Status: DC | PRN

## 2024-05-02 MED ORDER — DULOXETINE HCL 30 MG PO CPEP
30.0000 mg | ORAL_CAPSULE | Freq: Every day | ORAL | Status: DC
  Administered 2024-05-04 – 2024-05-10 (×10): 30 mg via ORAL
  Filled 2024-05-02 (×8): qty 1

## 2024-05-02 MED ORDER — DULOXETINE HCL 60 MG PO CPEP: 60.0000 mg | ORAL_CAPSULE | Freq: Every day | ORAL | Status: DC

## 2024-05-02 MED ORDER — BISACODYL 5 MG PO TBEC: 5.0000 mg | DELAYED_RELEASE_TABLET | Freq: Every day | ORAL | Status: DC | PRN

## 2024-05-02 MED ORDER — GABAPENTIN 100 MG PO CAPS
200.0000 mg | ORAL_CAPSULE | Freq: Every morning | ORAL | Status: DC
  Administered 2024-05-04 – 2024-05-10 (×10): 200 mg via ORAL
  Filled 2024-05-02 (×8): qty 2

## 2024-05-02 MED ORDER — AQUACEL AG FOAM 6"X6" EX PADS
1.0000 | MEDICATED_PAD | Freq: Every day | CUTANEOUS | Status: DC
  Administered 2024-05-02: 1 via CUTANEOUS

## 2024-05-02 MED ORDER — IOHEXOL 350 MG/ML SOLN
75.0000 mL | Freq: Once | INTRAVENOUS | Status: AC | PRN
  Administered 2024-05-02: 75 mL via INTRAVENOUS

## 2024-05-02 MED ORDER — FOAM DRESSING CIRCULAR BORDER EX PADS
1.0000 | MEDICATED_PAD | CUTANEOUS | Status: DC
  Administered 2024-05-02: 1 via CUTANEOUS

## 2024-05-02 MED ORDER — HYDRALAZINE HCL 20 MG/ML IJ SOLN
5.0000 mg | Freq: Four times a day (QID) | INTRAMUSCULAR | Status: DC | PRN
  Administered 2024-05-02 – 2024-05-07 (×11): 5 mg via INTRAVENOUS
  Filled 2024-05-02 (×9): qty 1

## 2024-05-02 MED ORDER — ONDANSETRON HCL 4 MG PO TABS: 4.0000 mg | ORAL_TABLET | Freq: Four times a day (QID) | ORAL | Status: DC | PRN

## 2024-05-02 MED ORDER — GABAPENTIN 300 MG PO CAPS
800.0000 mg | ORAL_CAPSULE | Freq: Every day | ORAL | Status: DC
  Administered 2024-05-02 – 2024-05-03 (×2): 800 mg via ORAL
  Filled 2024-05-02 (×2): qty 2

## 2024-05-02 MED ORDER — LACTULOSE 10 GM/15ML PO SOLN: 10.0000 g | Freq: Every day | ORAL | Status: DC | PRN

## 2024-05-02 MED ORDER — GENTAMICIN SULFATE 40 MG/ML IJ SOLN
5.0000 mg/kg | Freq: Once | INTRAVENOUS | Status: AC
  Administered 2024-05-02: 360 mg via INTRAVENOUS
  Filled 2024-05-02: qty 9

## 2024-05-02 MED ORDER — GABAPENTIN 300 MG PO CAPS
400.0000 mg | ORAL_CAPSULE | Freq: Two times a day (BID) | ORAL | Status: DC
  Administered 2024-05-04 – 2024-05-10 (×13): 400 mg via ORAL
  Filled 2024-05-02 (×12): qty 1

## 2024-05-02 MED ORDER — SENNA 8.6 MG PO TABS
2.0000 | ORAL_TABLET | Freq: Two times a day (BID) | ORAL | Status: DC
  Administered 2024-05-02 – 2024-05-10 (×21): 17.2 mg via ORAL
  Filled 2024-05-02 (×16): qty 2

## 2024-05-02 MED ORDER — ACETAMINOPHEN 325 MG PO TABS: 650.0000 mg | ORAL_TABLET | Freq: Four times a day (QID) | ORAL | Status: DC | PRN

## 2024-05-02 NOTE — Progress Notes (Signed)
 Eeg done

## 2024-05-02 NOTE — Procedures (Signed)
 Patient Name: CACHE DECOURSEY  MRN: [id number]  Epilepsy Attending: Arlin MALVA Krebs  Referring Physician/Provider: Laurita Cort DASEN, MD  Date: 05/02/2024 Duration: 32.37 mins  Patient history: 53yo f with ams. EEG to evaluate for seizure  Level of alertness: Awake  AEDs during EEG study: None  Technical aspects: This EEG study was done with scalp electrodes positioned according to the 10-20 International system of electrode placement. Electrical activity was reviewed with band pass filter of 1-70Hz , sensitivity of 7 uV/mm, display speed of 70mm/sec with a 60Hz  notched filter applied as appropriate. EEG data were recorded continuously and digitally stored.  Video monitoring was available and reviewed as appropriate.  Description: EEG showed continuous generalized 3 to 6 Hz theta-delta slowing. Hyperventilation and photic stimulation were not performed.     Of note, study was technically difficult due to significant myogenic artifact.   ABNORMALITY - Continuous slow, generalized  IMPRESSION: This technically difficult study is suggestive of moderate diffuse encephalopathy. No seizures or epileptiform discharges were seen throughout the recording.  Ho Parisi O Keneisha Heckart

## 2024-05-02 NOTE — Progress Notes (Signed)
 VAST consult received to obtain IV access. Upon arrival at bedside, phlebotomy present. Assessed patient's right arm utilizing ultrasound after nothing noted with tourniquet. Patient's vessels are extremely small and very deep (greater than 1.5cms); no IV catheters to reach this depth, including midline needles. Pt's left arm is restricted as she is unable to move it; generalized edema also noted. VAST RN able to obtain a 22G 2.5 inch IV in right anterior forearm in ONLY appropriate vessel. Phlebotomy able to obtain labs from a small vessel in patient's finger.  If patient will continue to need labs and IV access, a central line or PICC is recommended. Unit RN and MD updated.

## 2024-05-02 NOTE — ED Triage Notes (Signed)
 Pt to ED via [facility] from home. C/C AMS. Per family, LKW at 2230 yesterday. Per EMS pt unresponsive except to very painful stimuli. No known opiod use. Pt maintaining airway. Pt's baseline A+Ox4. Pt has hx of MS.  EMS vitals HR 77 BP 172/76 SPO2 100 RA CBG 95

## 2024-05-02 NOTE — ED Notes (Signed)
IV team and phlebotomy at bedside. 

## 2024-05-02 NOTE — ED Notes (Signed)
 IV team consult placed due to unable to obtain access. Pt still minimally responsive intermittently. Pt able to open eyes to speech at times but still unresponsive. Pt protecting airway and breathing spontaneously.

## 2024-05-02 NOTE — H&P (Addendum)
 History and Physical    [name] FMW:[id number] DOB: [birth year] DOA: 05/02/2024  PCP: Samantha Tinnie BIRCH, MD (Confirm with patient/family/NH records and if not entered, this has to be entered at [facility] point of entry) Patient coming from: Home  I have personally briefly reviewed patient's old medical records in [facility]  Chief Complaint: AMS  HPI: [name] is a 53 y.o. female with medical history significant of multiple sclerosis with chronic left-sided weakness neurogenic bladder status post ileal conduit chronic indwelling catheter, recurrent MDR UTIs, HTN, HFpEF, anxiety/depression, brought in by family member for worsening of mentation changes.  Sister at bedside reported that since yesterday patient has become more sleepy and when she woke up she has episodes of confusion as well.  This morning, family noticed that the patient is hard to arouse and decided to bring her to the hospital.  ED Course: Afebrile, nontachycardic blood pressure 190/90 O2 saturation 100% on room air.  CTA head and neck negative for acute intracranial findings or LVO.  Blood work showed WBC 15 hemoglobin 12, platelet 510, sodium 139 potassium 4.0 bicarb 26 BUN 20 creatinine 0.9, VBG 7.42/45/97, ammonia less than 13  Patient was given gentamicin  x 1 in the ED.  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 She has  never used smokeless tobacco. She reports that she does not currently use alcohol. She reports that she does not use drugs.  Allergies  Allergen Reactions   Latex Swelling and Other (See Comments)    Reaction:  Facial swelling    Vancomycin  Itching    Family History  Problem Relation Age of Onset   Osteoarthritis Mother    Hypertension Mother    Hyperlipidemia Mother    Thyroid disease Mother    Liver cancer Father    Asthma Brother      Prior to Admission medications   Medication Sig Start Date End Date Taking? Authorizing Provider  acetaminophen  (TYLENOL ) 500 MG tablet Take 500 mg by mouth every 6 (six) hours as needed.    [provider]  amantadine  (SYMMETREL ) 100 MG capsule Take 200 mg by mouth 2 (two) times daily.    [provider]  baclofen  (LIORESAL ) 20 MG tablet Take 20 mg by mouth 4 (four) times daily. 08/31/23   [provider]  bisacodyl  (DULCOLAX) 5 MG EC tablet Take 1 tablet (5 mg total) by mouth daily as needed for moderate constipation. 03/18/24 03/18/25  Willo Dunnings, MD  DULoxetine  (CYMBALTA ) 60 MG capsule Take 60 mg by mouth daily. 04/05/24 05/05/24  [provider]  gabapentin  (NEURONTIN ) 100 MG capsule Take 200 mg by mouth in the morning, at noon, and at bedtime. 06/17/21   [provider]  gabapentin  (NEURONTIN ) 800 MG tablet Take 800 mg by mouth at bedtime. 07/08/21   [provider]  lactulose  (CHRONULAC ) 10 GM/15ML solution Take 10 g  by mouth daily. 01/25/24   [provider]  ondansetron  (ZOFRAN -ODT) 4 MG disintegrating tablet Take 1 tablet (4 mg total) by mouth every 8 (eight) hours as needed. 03/18/24   Claudene Rover, MD  pantoprazole  (PROTONIX ) 40 MG tablet Take 1 tablet by mouth daily. 10/02/23   [provider]  senna (SENOKOT) 8.6 MG tablet Take 2 tablets by mouth 2 (two) times daily. 06/24/22   [provider]  Silver  (AQUACEL AG FOAM) 6X6 PADS Apply 1 each topically daily in  the afternoon. 03/16/23   Wouk, Devaughn Sayres, MD  Wound Dressings (FOAM DRESSING CIRCULAR BORDER) PADS Apply 1 each topically every 3 (three) days. 03/16/23   Kandis Devaughn Sayres, MD    Physical Exam: Vitals:   05/02/24 1230 05/02/24 1330 05/02/24 1446 05/02/24 1502  BP: (!) 179/89 (!) 185/91  (!) 188/97  Pulse: 74 71 78 76  Resp: (!) 9 12 11 12   Temp:      TempSrc:      SpO2: 100% 100% 100% 100%  Weight:      Height:        Constitutional: NAD, calm, comfortable Vitals:   05/02/24 1230 05/02/24 1330 05/02/24 1446 05/02/24 1502  BP: (!) 179/89 (!) 185/91  (!) 188/97  Pulse: 74 71 78 76  Resp: (!) 9 12 11 12   Temp:      TempSrc:      SpO2: 100% 100% 100% 100%  Weight:      Height:       Eyes: PERRL, lids and conjunctivae normal ENMT: Mucous membranes are moist. Posterior pharynx clear of any exudate or lesions.Normal dentition.  Neck: normal, supple, no masses, no thyromegaly Respiratory: clear to auscultation bilaterally, no wheezing, no crackles. Normal respiratory effort. No accessory muscle use.  Cardiovascular: Regular rate and rhythm, no murmurs / rubs / gallops. No extremity edema. 2+ pedal pulses. No carotid bruits.  Abdomen: no tenderness, no masses palpated. No hepatosplenomegaly. Bowel sounds positive.  Musculoskeletal: no clubbing / cyanosis. No joint deformity upper and lower extremities. Good ROM, no contractures. Normal muscle tone.  Skin: no rashes, lesions, ulcers. No induration Neurologic: No facial droops, chronic left-sided weakness Psychiatric: Somnolent, arousable to voice, confused  Yes  Labs on Admission: I have personally reviewed following labs and imaging studies  CBC: Recent Labs  Lab 04/26/24 1114 04/27/24 0521 05/02/24 1206  WBC 10.9* 10.0 15.2*  NEUTROABS 6.1  --  9.7*  HGB 12.0 10.7* 12.3  HCT 36.9 32.5* 37.8  MCV 90.2 89.8 90.2  PLT 318 315 510*   Basic Metabolic Panel: Recent Labs  Lab 04/26/24 1114 04/27/24 0521  05/02/24 1206  NA 137 140 139  K 3.3* 3.5 4.0  CL 102 105 102  CO2 23 24 26   GLUCOSE 83 96 94  BUN 9 9 20   CREATININE 0.57 0.52 0.98  CALCIUM 8.7* 8.7* 9.2   GFR: Estimated Creatinine Clearance: 63.9 mL/min (by C-G formula based on SCr of 0.98 mg/dL). Liver Function Tests: Recent Labs  Lab 04/26/24 1114 04/27/24 0521 05/02/24 1206  AST 24 23 15   ALT 23 22 21   ALKPHOS 73 68 79  BILITOT 0.9 0.6 0.9  PROT 7.0 7.0 8.1  ALBUMIN 2.8* 2.9* 3.2*   No results for input(s): LIPASE, AMYLASE in the last 168 hours. Recent Labs  Lab 05/02/24 1206  AMMONIA <13   Coagulation Profile: No results for input(s): INR, PROTIME in the last 168 hours. Cardiac Enzymes: No results for input(s): CKTOTAL, CKMB, CKMBINDEX,  TROPONINI in the last 168 hours. BNP (last 3 results) No results for input(s): PROBNP in the last 8760 hours. HbA1C: No results for input(s): HGBA1C in the last 72 hours. CBG: No results for input(s): GLUCAP in the last 168 hours. Lipid Profile: No results for input(s): CHOL, HDL, LDLCALC, TRIG, CHOLHDL, LDLDIRECT in the last 72 hours. Thyroid Function Tests: No results for input(s): TSH, T4TOTAL, FREET4, T3FREE, THYROIDAB in the last 72 hours. Anemia Panel: No results for input(s): VITAMINB12, FOLATE, FERRITIN, TIBC, IRON, RETICCTPCT in the last 72 hours. Urine analysis:    Component Value Date/Time   COLORURINE YELLOW (A) 05/02/2024 1210   APPEARANCEUR HAZY (A) 05/02/2024 1210   APPEARANCEUR Cloudy 10/05/2014 2100   LABSPEC 1.005 05/02/2024 1210   LABSPEC 1.009 10/05/2014 2100   PHURINE 7.0 05/02/2024 1210   GLUCOSEU NEGATIVE 05/02/2024 1210   GLUCOSEU Negative 10/05/2014 2100   HGBUR NEGATIVE 05/02/2024 1210   BILIRUBINUR NEGATIVE 05/02/2024 1210   BILIRUBINUR Negative 10/05/2014 2100   KETONESUR NEGATIVE 05/02/2024 1210   PROTEINUR NEGATIVE 05/02/2024 1210   NITRITE NEGATIVE 05/02/2024 1210    LEUKOCYTESUR LARGE (A) 05/02/2024 1210   LEUKOCYTESUR 3+ 10/05/2014 2100    Radiological Exams on Admission: CT ANGIO HEAD NECK W WO CM Result Date: 05/02/2024 CLINICAL DATA:  53 year old female is unresponsive. Last known well 2230 hours. Known multiple sclerosis. EXAM: CT ANGIOGRAPHY HEAD AND NECK WITH AND WITHOUT CONTRAST TECHNIQUE: Multidetector CT imaging of the head and neck was performed using the standard protocol during bolus administration of intravenous contrast. Multiplanar CT image reconstructions and MIPs were obtained to evaluate the vascular anatomy. Carotid stenosis measurements (when applicable) are obtained utilizing NASCET criteria, using the distal internal carotid diameter as the denominator. RADIATION DOSE REDUCTION: This exam was performed according to the departmental dose-optimization program which includes automated exposure control, adjustment of the mA and/or kV according to patient size and/or use of iterative reconstruction technique. CONTRAST:  75mL OMNIPAQUE  IOHEXOL  350 MG/ML SOLN COMPARISON:  Brain and cervical spine MRI, head CT 04/21/2024, and earlier. FINDINGS: CT HEAD Brain: Stable cerebral volume. No midline shift, ventriculomegaly, mass effect, evidence of mass lesion, intracranial hemorrhage or evidence of cortically based acute infarction. Gray-white matter differentiation is within normal limits throughout the brain. Calvarium and skull base: Intact, negative. Paranasal sinuses: Visualized paranasal sinuses and mastoids are stable and well aerated. Orbits: Visualized orbits and scalp soft tissues are within normal limits. CTA NECK Skeleton: Negative. Upper chest: Platelike atelectasis partially visible in the superior segment of the right lower lobe, otherwise visible bilateral lungs, central airways are patent. Negative visible superior mediastinum. Other neck: Small volume intravenous gas right IJ is likely IV access related. Nonvascular neck soft tissue spaces  appear negative. Aortic arch: Normal 3 vessel arch. Right carotid system: Patent and normal. Left carotid system: Patent and normal. Vertebral arteries: Proximal right subclavian artery and right vertebral artery origin are normal. Right vertebral artery appears patent and within normal limits to the skull base. Proximal left subclavian artery and left vertebral artery origin are normal. Non dominant left vertebral artery, concordant with cervical spine MRI flow voids on 01/11/2023. Left vertebral artery remains diminutive but patent to the skull base. CTA HEAD Posterior circulation: Non dominant left vertebral artery functionally terminates in PICA. Dominant right V4 segment primarily supplies the basilar. Normal right PICA origin. Diminutive basilar artery is patent without stenosis. Patent SCA and PCA origins with both posterior communicating arteries present. Bilateral PCA branches are within normal limits. Anterior circulation: Patent  although diminutive bilateral ICA siphons, similar to brain MRI flow voids 01/11/2023. And also previous exam 04/22/2010. No discrete ICA siphon plaque or stenosis. Normal posterior communicating artery origins. Patent carotid termini, MCA and ACA origins. Diminutive or absent anterior communicating artery. Bilateral ACA branches are within normal limits. MCA M1 segments and bifurcations appear patent without stenosis. Bilateral MCA branches are within normal limits. Venous sinuses: Early contrast timing, superior sagittal sinus appears patent. Anatomic variants: Dominant right vertebral artery which primarily supplies the basilar. Review of the MIP images confirms the above findings IMPRESSION: 1. CTA Head and Neck is negative for atherosclerosis or large vessel occlusion. Chronically diminutive appearance of the intracranial circulation. 2. Stable and negative CT appearance of the brain. 3. Atelectasis partially visible in the right lower lobe superior segment Electronically  Signed   By: VEAR Hurst M.D.   On: 05/02/2024 14:48   DG Chest Portable 1 View Result Date: 05/02/2024 CLINICAL DATA:  Found unresponsive EXAM: PORTABLE CHEST 1 VIEW COMPARISON:  04/26/2024 FINDINGS: Cardiac shadow is stable. Lungs are well aerated bilaterally. Minimal right basilar atelectasis/scarring is again seen and stable. No new focal abnormality is noted. IMPRESSION: No active disease. Electronically Signed   By: Oneil Devonshire M.D.   On: 05/02/2024 12:09    EKG: Independently reviewed.  Sinus rhythm, no acute ST changes.  Assessment/Plan Principal Problem:   Encephalopathy Active Problems:   Acute metabolic encephalopathy   UTI (urinary tract infection)  (please populate well all problems here in Problem List. (For example, if patient is on BP meds at home and you resume or decide to hold them, it is a problem that needs to be her. Same for CAD, COPD, HLD and so on)  Acute metabolic encephalopathy, GCS=9 - Went through patient's record along with infection disease attending and pharmacist, patient was hospitalized 2 times before today in late July and early August for similar presentation.  The fact that urine culture grew different bacteria on 2 occasions implying colonization rather real UTI.  As per recommendation from ID and pharmacist, we will hold off antibiotics and monitor clinically, send repeat CBC. - Other DDx, other common causes for encephalopathy or looked into ammonium level normal VBG showed no pH or CO2 usual.  Will check EEG. Will also cut down CNS medication potentially cause her symptoms, cut down gabapentin  dosage to 200mg  TID, Cymbalta  to 200 mg daily  Leukocytosis - As above.  No other significant SIRS signs  Question of syncope - Less likely given patient's presentation - Echocardiogram was done recently, will not repeat - Continue telemonitoring-rule out significant arrhythmia  History of multiple sclerosis Chronic neurogenic bladder status post ileal  conduit Chronic left-sided paresis - No significant new findings on neurological exam as well as CTA head and neck today  HTN, uncontrolled - Resume home BP meds when able to take p.o. - Add as needed hydralazine   Total time spent on patient care 75 minutes  DVT prophylaxis: Lovenox  Code Status: Full code Family Communication: Sister at bedside Disposition Plan: Patient is sick with likely MDR UTI encephalopathy requiring IV antibiotics per, expect more than 2 midnight hospital stay Consults called: Infectious disease Admission status: Telemetry admission   Cort ONEIDA Mana MD [facility] Pager [phone]  05/02/2024, 3:45 PM

## 2024-05-02 NOTE — ED Notes (Signed)
 Lab called to send phlebotomy at this time. RN spoke with Namesha.

## 2024-05-02 NOTE — ED Notes (Signed)
MD aware unable to obtain IV access

## 2024-05-02 NOTE — Progress Notes (Incomplete Revision)
 VAST consult received to obtain IV access. Upon arrival at bedside, phlebotomy present. Assessed patient's right arm utilizing ultrasound after nothing noted with tourniquet. Patient's vessels are extremely small and very deep (greater than 1.5cms); no IV catheters to reach this depth, including midline needles. Pt's left arm is restricted as she is unable to move it; generalized edema also noted. VAST RN able to obtain a 22G 2.5 inch IV in right anterior forearm in ONLY appropriate vessel. Phlebotomy able to obtain labs from a small vessel in patient's finger.  If patient will continue to need labs and IV access, a central line or PICC is recommended. Unit RN and MD updated.

## 2024-05-02 NOTE — ED Notes (Signed)
 EEG in progress at this time.

## 2024-05-02 NOTE — ED Provider Notes (Signed)
 [facility] Provider Note    Event Date/Time   First MD Initiated Contact with Patient 05/02/24 1059     (approximate)   History   Altered Mental Status  Pt to ED via [facility] from home. C/C AMS. Per family, LKW at 2230 yesterday. Per EMS pt unresponsive except to very painful stimuli. No known opiod use. Pt maintaining airway. Pt's baseline A+Ox4. Pt has hx of MS.  EMS vitals HR 77 BP 172/76 SPO2 100 RA CBG 95    HPI [name] is a 53 y.o. female PMH multiple sclerosis, cleared by left-sided weakness and neurologic bladder status post ileal conduit, CHF, decubitus ulcer, recurrent MDR UTI presents for evaluation of altered mental status - Patient is nonresponsive at time of eval.  Satting 100% on room air.  Glucose normal per EMS.  Afebrile. 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  Notes that she is conversive at baseline and normally has function of her right upper extremity.  Per chart review, appears patient was recently admitted for UTI and a syncopal episode.  Patient was also admitted 7/27-7/31/2025 acute metabolic encephalopathy in the setting of UTI.       Physical Exam   Triage Vital Signs: BP (!) 185/91   Pulse 78   Temp (!) 97.4 F (36.3 C) (Axillary)   Resp 11   Ht 5' 4 (1.626 m)   Wt 72.1 kg   SpO2 100%   BMI 27.29 kg/m    Most recent vital signs: Vitals:   05/02/24 1330 05/02/24 1446  BP: (!) 185/91   Pulse: 71 78  Resp: 12 11  Temp:    SpO2: 100% 100%     General: Awake, no distress.  HEENT: Normocephalic, atraumatic, PERRL CV:  Good peripheral perfusion. RRR, RP 2+ Resp:  Normal effort. CTAB Abd:  No distention. Nontender to deep palpation throughout.  Urostomy in place. Neuro:  Obtunded, protecting airway, not interactive with neurologic exam, not responding to noxious stimuli   ED Results / Procedures / Treatments   Labs (all labs  ordered are listed, but only abnormal results are displayed) Labs Reviewed  COMPREHENSIVE METABOLIC PANEL WITH GFR - Abnormal; Notable for the following components:      Result Value   Albumin 3.2 (*)    All other components within normal limits  CBC WITH DIFFERENTIAL/PLATELET - Abnormal; Notable for the following components:   WBC 15.2 (*)    Platelets 510 (*)    Neutro Abs 9.7 (*)    Monocytes Absolute 1.8 (*)    Eosinophils Absolute 1.2 (*)    Abs Immature Granulocytes 0.11 (*)    All other components within normal limits  URINALYSIS, COMPLETE (UACMP) WITH MICROSCOPIC - Abnormal; Notable for the following components:   Color, Urine YELLOW (*)    APPearance HAZY (*)    Leukocytes,Ua LARGE (*)    Bacteria, UA FEW (*)    All other components within normal limits  BLOOD GAS, VENOUS - Abnormal; Notable for the following components:   pO2, Ven 97 (*)    Bicarbonate 29.2 (*)    Acid-Base Excess 4.0 (*)    All other components within normal limits  URINE DRUG SCREEN, QUALITATIVE ([facility] ONLY)  ETHANOL  AMMONIA     EKG  Ecg = nsr, rate 73, no ste/std, no repol abnl, normal axis, normal intervals. No e/o ischemia nor arrhythmia on my  read.    RADIOLOGY Radiology interpreted by myself and radiology report reviewed.  No acute pathology identified.    PROCEDURES:  Critical Care performed: No  .Ultrasound ED Peripheral IV (Provider)  Date/Time: 05/02/2024 1:02 PM  Performed by: Clarine Ozell LABOR, MD Authorized by: Clarine Ozell LABOR, MD   Procedure details:    Indications: multiple failed IV attempts     Skin Prep: isopropyl alcohol     Location: L upper arm.   Angiocath:  20 G   Bedside Ultrasound Guided: Yes     Images: not archived     Patient tolerated procedure without complications: Yes     Dressing applied: Yes      MEDICATIONS ORDERED IN ED: Medications  naloxone  (NARCAN ) injection 1 mg (1 mg Intravenous Given 05/02/24 1216)  gentamicin  (GARAMYCIN ) 360 mg in  dextrose  5 % 50 mL IVPB (0 mg Intravenous Stopped 05/02/24 1446)  iohexol  (OMNIPAQUE ) 350 MG/ML injection 75 mL (75 mLs Intravenous Contrast Given 05/02/24 1405)     IMPRESSION / MDM / ASSESSMENT AND PLAN / ED COURSE  I reviewed the triage vital signs and the nursing notes.                              DDX/MDM/AP: Differential diagnosis includes, but is not limited to, metabolic or infectious encephalopathy, consider possibility of stroke, MS flare, intracranial hemorrhage.  Doubt toxic encephalopathy though consider possibility of opioid overdose.  Plan: - Cardiac monitor - Labs  -chest x-ray - CTA head and neck - Trial of Narcan  - Close monitoring, no indication for airway intervention at this time, is protecting airway without difficulty and satting 100% on room air  Patient's presentation is most consistent with acute presentation with potential threat to life or bodily function.  The patient is on the cardiac monitor to evaluate for evidence of arrhythmia and/or significant heart rate changes.  ED course below.  Workup notable for evidence of recurrent UTI, treated with gentamicin  based on prior urine cultures.  Does have leukocytosis today suggestive of genuine infection.  CTA head and neck unremarkable.  Subsequent sacral exam unremarkable with no concerning findings.  Mentation is improving here in the emergency department, continues to protect her airway without difficulty.  Overall suspect likely infectious encephalopathy secondary to UTI.  Admitted to hospitalist service.  Clinical Course as of 05/02/24 1456  Thu May 02, 2024  1112 Patient appears to be improving, now opening eyes spontaneously and tracking.  Not vocalizing. [MM]  1220 VBG with no significant hypercarbia to explain AMS [MM]  1220 CXR: IMPRESSION: No active disease.   [MM]  1248 CBC with leukocytosis to 15 and left shift [MM]  1301 delayed access including with ID team attempts.  I personally placed  20-gauge ultrasound-guided IV to left upper arm.  Flushes without difficulty, confirmed no extravasation on ultrasound.  Of note, patient is now conversive with me. [MM]  1315 Ammonia wnl UA c/f infxn, will treat Etoh wnl UDS neg  CMP reviewed, unremarkable [MM]  1322 Prior urine cultures, has grown MRSA, Pseudomonas, coccus.  Appears gentamicin  would be appropriate.  Did receive this for prior UTI however was held after discussion with ID because patient had no leukocytosis or fever.  Patient does have leukocytosis today and I suspect may be experiencing infectious encephalopathy secondary to UTI.  Will treat with gentamicin .  Dosing discussed with pharmacy. [MM]  1436 CT head and neck reviewed, no obvious  pathology on my interpretation, radiology report pending  Hospitalist consult order placed [MM]  1452 CTA head/ Neck: IMPRESSION: 1. CTA Head and Neck is negative for atherosclerosis or large vessel occlusion. Chronically diminutive appearance of the intracranial circulation. 2. Stable and negative CT appearance of the brain. 3. Atelectasis partially visible in the right lower lobe superior segment   [MM]    Clinical Course User Index [MM] Clarine Ozell LABOR, MD     FINAL CLINICAL IMPRESSION(S) / ED DIAGNOSES   Final diagnoses:  Altered mental status, unspecified altered mental status type  Urinary tract infection associated with cystostomy catheter, initial encounter (HCC)     Rx / DC Orders   ED Discharge Orders     None        Note:  This document was prepared using Dragon voice recognition software and may include unintentional dictation errors.   Clarine Ozell LABOR, MD 05/02/24 [id number]

## 2024-05-03 ENCOUNTER — Other Ambulatory Visit: Payer: Self-pay

## 2024-05-03 DIAGNOSIS — M436 Torticollis: Secondary | ICD-10-CM

## 2024-05-03 DIAGNOSIS — G8220 Paraplegia, unspecified: Secondary | ICD-10-CM

## 2024-05-03 DIAGNOSIS — G9341 Metabolic encephalopathy: Secondary | ICD-10-CM | POA: Diagnosis not present

## 2024-05-03 DIAGNOSIS — N390 Urinary tract infection, site not specified: Secondary | ICD-10-CM

## 2024-05-03 DIAGNOSIS — I5032 Chronic diastolic (congestive) heart failure: Secondary | ICD-10-CM | POA: Diagnosis not present

## 2024-05-03 DIAGNOSIS — G9349 Other encephalopathy: Secondary | ICD-10-CM

## 2024-05-03 DIAGNOSIS — G35 Multiple sclerosis: Secondary | ICD-10-CM

## 2024-05-03 DIAGNOSIS — R4182 Altered mental status, unspecified: Secondary | ICD-10-CM | POA: Diagnosis not present

## 2024-05-03 DIAGNOSIS — I10 Essential (primary) hypertension: Secondary | ICD-10-CM

## 2024-05-03 DIAGNOSIS — G928 Other toxic encephalopathy: Secondary | ICD-10-CM | POA: Diagnosis not present

## 2024-05-03 LAB — CBC
HCT: 37.2 % (ref 36.0–46.0)
Hemoglobin: 12 g/dL (ref 12.0–15.0)
MCH: 29.3 pg (ref 26.0–34.0)
MCHC: 32.3 g/dL (ref 30.0–36.0)
MCV: 91 fL (ref 80.0–100.0)
Platelets: 553 K/uL — ABNORMAL HIGH (ref 150–400)
RBC: 4.09 MIL/uL (ref 3.87–5.11)
RDW: 14.8 % (ref 11.5–15.5)
WBC: 17.5 K/uL — ABNORMAL HIGH (ref 4.0–10.5)
nRBC: 0 % (ref 0.0–0.2)

## 2024-05-03 LAB — BASIC METABOLIC PANEL WITH GFR
Anion gap: 13 (ref 5–15)
BUN: 20 mg/dL (ref 6–20)
CO2: 25 mmol/L (ref 22–32)
Calcium: 9.4 mg/dL (ref 8.9–10.3)
Chloride: 103 mmol/L (ref 98–111)
Creatinine, Ser: 1.01 mg/dL — ABNORMAL HIGH (ref 0.44–1.00)
GFR, Estimated: >60 mL/min (ref >60)
Glucose, Bld: 96 mg/dL (ref 70–99)
Potassium: 4.4 mmol/L (ref 3.5–5.1)
Sodium: 141 mmol/L (ref 135–145)

## 2024-05-03 MED ORDER — SODIUM CHLORIDE 0.9 % IV SOLN
3.0000 g | Freq: Four times a day (QID) | INTRAVENOUS | Status: DC
  Filled 2024-05-03 (×2): qty 8

## 2024-05-03 MED ORDER — LACTATED RINGERS IV SOLN: INTRAVENOUS | Status: DC

## 2024-05-03 NOTE — Hospital Course (Signed)
 [name] is a 53 y.o. female with medical history significant of multiple sclerosis with chronic left-sided weakness neurogenic bladder status post ileal conduit chronic indwelling catheter, recurrent MDR UTIs, HTN, HFpEF, anxiety/depression, brought in by family member for worsening of mentation changes.

## 2024-05-03 NOTE — Plan of Care (Signed)

## 2024-05-03 NOTE — Consult Note (Signed)
 NEUROLOGY CONSULT NOTE   Date of service: May 03, 2024 Patient Name: [name] MRN:  [id number] DOB:  [birth year] Chief Complaint: AMS Requesting Provider: Laurita Pillion, MD  History of Present Illness  [name] is a 53 y.o. female with a PMHx of primary progressive MS, neurogenic bowel, neurogenic bladder s/p ileal conduit, CHF, decubitus ulcer, recurrent multidrug resistant UTI and neuropathic pain who presented to the hospital from home yesterday morning with a chief complaint of AMS. She was last seen normal by family on Wednesday night. EMS stated that she was unresponsive except to very painful stimuli. She was able to maintain her airway. At her baseline she is alert and oriented x 4. 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 She was recently admitted at the end of July for acute metabolic encephalopathy in the setting of a UTI.   Vitals on arrival to the ED yesterday: HR 77, BP 172/76, SPO2 100 RA, CBG 95, Temp 97.4.    ROS  Unable to obtain due to AMS.  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   Neurogenic bowel 08/12/2012   Neuropathic pain 05/21/2015    Past Surgical History:  Procedure Laterality Date   ABDOMINAL HYSTERECTOMY  2001   BACK SURGERY  2004   REVISION UROSTOMY CUTANEOUS  06/2012    Family History: Family History  Problem Relation Age of Onset   Osteoarthritis Mother    Hypertension Mother    Hyperlipidemia Mother    Thyroid disease Mother    Liver cancer Father    Asthma Brother     Social History  reports that she has never smoked. She has never used smokeless tobacco.  She reports that she does not currently use alcohol. She reports that she does not use drugs.  Allergies  Allergen Reactions   Latex Swelling and Other (See Comments)    Reaction:  Facial swelling    Vancomycin  Itching    Medications   Current Facility-Administered Medications:    acetaminophen  (TYLENOL ) tablet 650 mg, 650 mg, Oral, Q6H PRN **OR** acetaminophen  (TYLENOL ) suppository 650 mg, 650 mg, Rectal, Q6H PRN, Laurita Cort DASEN, MD   baclofen  (LIORESAL ) tablet 20 mg, 20 mg, Oral, QID, Zhang, Ping T, MD, 20 mg at 05/02/24 1904   bisacodyl  (DULCOLAX) EC tablet 5 mg, 5 mg, Oral, Daily PRN, Zhang, Ping T, MD   DULoxetine  (CYMBALTA ) DR capsule 30 mg, 30 mg, Oral, Daily, Zhang, Ping T, MD   enoxaparin  (LOVENOX ) injection 40 mg, 40 mg, Subcutaneous, Q24H, Zhang, Ping T, MD, 40 mg at 05/02/24 2217   gabapentin  (NEURONTIN ) capsule 200 mg, 200 mg, Oral, q AM **AND** gabapentin  (NEURONTIN ) capsule 400 mg, 400 mg, Oral, BID **AND** gabapentin  (NEURONTIN ) capsule 800 mg, 800 mg, Oral, QHS, Zhang, Ping T, MD, 800 mg at 05/02/24 2313   hydrALAZINE  (APRESOLINE ) injection 5 mg, 5 mg, Intravenous, Q6H PRN, Laurita Cort T, MD, 5 mg at 05/03/24 0645   lactated ringers  infusion, , Intravenous, Continuous, Laurita Pillion, MD, Last Rate: 75 mL/hr at 05/03/24 1320, New Bag at 05/03/24 1320   lactulose  (CHRONULAC ) 10 GM/15ML solution 10  g, 10 g, Oral, Daily PRN, Laurita Manor T, MD   ondansetron  (ZOFRAN ) tablet 4 mg, 4 mg, Oral, Q6H PRN **OR** ondansetron  (ZOFRAN ) injection 4 mg, 4 mg, Intravenous, Q6H PRN, Laurita Manor DASEN, MD   pantoprazole  (PROTONIX ) EC tablet 40 mg, 40 mg, Oral, Daily, Zhang, Manor DASEN, MD   senna [facility]) tablet 17.2 mg, 2 tablet, Oral, BID, Laurita Manor T, MD, 17.2 mg at May 20, 2024 2313  Vitals   Vitals:   05-20-24 2230 05/03/24 0230 05/03/24 0630 05/03/24 1030  BP: (!) 128/112 (!) 173/88 (!) 182/100 (!) 171/77  Pulse: 78 93 91 87  Resp: 18 20 18 18   Temp: 98 F (36.7 C) 98.9 F (37.2 C) 97.9 F  (36.6 C) 98 F (36.7 C)  TempSrc:  Oral Oral Axillary  SpO2:   100% 95%  Weight:      Height:        Body mass index is 27.29 kg/m.   Physical Exam   Constitutional: Carollynn middle aged female lying in bed in NAD.  Eyes: No scleral injection.  HENT: No OP obstruction. Mild resistance to lateral rotation of the head. There is nuchal rigidity with attempts to flex the neck, in the context of diffuse limb rigidity.  Head: Normocephalic.  Respiratory: Effort normal, non-labored breathing.    Neurologic Examination   Mental Status:  Awake and alert. After examiner holds up 5 fingers and asks her to count, she is unable to, but will repeat 5 with dysarthria when the examiner gives her the number to repeat. No coherent speech output other than the above. Otherwise will murmur Mmmm-mmm in response to all questions. Will make eye contact. Will follow commands to look to the left, look to the right, stick out her tongue and squeeze examiner's hand. Not able to follow more than one step commands. Cranial Nerves: II: PERRL. III,IV, VI: No ptosis. EOMI with no nystagmus. Mild exotropia.  V: Reacts to touch bilaterally VII: Grimaces symmetrically when murmuring VIII: Hearing intact to voice IX,X: Gag reflex deferred XI: Increased tone of neck musculature as noted above.  XII: Midline tongue extension Motor: RUE: with 2/5 grip. Severely increased tone proximally and distally with arm extended and slightly internally rotated at side, and increased finger flexor tone.  LUE: with 1/5 grip. Severely increased tone proximally and distally with arm extended and slightly internally rotated at side, and increased finger flexor tone that is worse than on the right. Flexion contractures of digits; unable to extend digits, requiring examiner to manually extend fingers slightly prior to grip testing.  Other than for grip, she is unable to move upper extremities volitionally.  RLE: Increased  extensor tone with rigidity. Cannot move proximally or distally to command. Weak dorsiflexion of foot to plantar stimulation.  LLE: Increased extensor tone with rigidity. Cannot move proximally or distally to command. Weak dorsiflexion of foot to plantar stimulation.  Sensory: Grips to palmar stimulation and dorsiflexes feet to plantar stimulation. Pinch stimuli deferred.  Deep Tendon Reflexes: Trace biceps and brachioradialis bilaterally. Brisk, trace low amplitude patellar reflexes. Right toe upgoing, left toe mute. Sustained, low amplitude clonus at ankles upon dorsiflexion by examiner.  Cerebellar/Gait: Unable to assess   Labs/Imaging/Neurodiagnostic studies   CBC:  Recent Labs  Lab 20-May-2024 1206 05/03/24 0328  WBC 15.2* 17.5*  NEUTROABS 9.7*  --   HGB 12.3 12.0  HCT 37.8 37.2  MCV 90.2 91.0  PLT 510* 553*   Basic Metabolic Panel:  Lab Results  Component Value Date  NA 141 05/03/2024   K 4.4 05/03/2024   CO2 25 05/03/2024   GLUCOSE 96 05/03/2024   BUN 20 05/03/2024   CREATININE 1.01 (H) 05/03/2024   CALCIUM 9.4 05/03/2024   GFRNONAA >60 05/03/2024   GFRAA >60 07/26/2018   Lipid Panel:  Lab Results  Component Value Date   LDLCALC 90 04/14/2017   HgbA1c:  Lab Results  Component Value Date   HGBA1C 5.0 04/25/2023   Urine Drug Screen:     Component Value Date/Time   LABOPIA NONE DETECTED 05/02/2024 1210   COCAINSCRNUR NONE DETECTED 05/02/2024 1210   LABBENZ NONE DETECTED 05/02/2024 1210   AMPHETMU NONE DETECTED 05/02/2024 1210   THCU NONE DETECTED 05/02/2024 1210   LABBARB NONE DETECTED 05/02/2024 1210    Alcohol Level     Component Value Date/Time   [facility] <15 05/02/2024 1206   INR  Lab Results  Component Value Date   INR 1.1 04/21/2024   APTT  Lab Results  Component Value Date   APTT 31 12/08/2023     ASSESSMENT  53 y.o. female with a PMHx of primary progressive MS, neurogenic bowel, neurogenic bladder s/p ileal conduit, CHF, decubitus ulcer,  recurrent multidrug resistant UTI and neuropathic pain who presented to the hospital from home yesterday morning with a chief complaint of AMS. She was last seen normal by family on Wednesday night. EMS stated that she was unresponsive except to very painful stimuli. She was able to maintain her airway. At her baseline she is alert and oriented x 4. 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 She was recently admitted at the end of July for acute metabolic encephalopathy in the setting of a UTI.  - Exam reveals limb rigidity x 4 as well as neck rigidity to flexion when attempting to move passively, but less resistance to head rotation. The patient is mute except for being able to repeat a number. EOMI. Can follow some simple one-step commands.  - Exam findings best localize as diffuse severe UMN weakness and spasticity, in addition to diffuse bihemispheric dysfunction as can be seen with a toxic/metabolic or infectious encephalopathy.  - CTA of head and neck: Negative for atherosclerosis or large vessel occlusion. Chronically diminutive appearance of the intracranial circulation. Stable and negative CT appearance of the brain. Atelectasis partially visible in the right lower lobe superior segment - CXR with no active disease - Labs:  - BMP normal except for mildly elevated Cr of 1.01.  - WBC elevated at 17.5. - ESR elevated at 51 - Elevated platelets of 553.  - Blood cultures x 2 pending. - Recent urine culture on 8/1 with 80,000 cfu.mL of enterococcus faecalis - Impression:  - Suspect AMS secondary to recurrent UTI. MS exacerbation does not usually present with AMS. However, patients with severe MS and decreased neurological reserve often can experience significant decline in mentation from relatively minor insults such as mild infections and dehydration. Of note, patients with primary progressive MS can  experience hypothalamic dysfunction, which may limit their ability to mount a fever response to infection.  - Baclofen  dose not high enough to produce AMS and she has relatively preserved renal function - Her neck stiffness is noted in conjunction with diffuse muscle rigidity and she is afebrile. Meningitis is felt to be low on the DDx and LP risks most likely outweigh benefits, unless she worsens clinically relative to  the current exam.    RECOMMENDATIONS  - Metabolic versus infectious encephalopathy work up.  - Frequent neuro checks - Continue Baclofen . Will need NGT for administration. Can experience withdrawal symptoms if Baclofen  is suddenly withdrawn.  - IVF ______________________________________________________________________    Bonney SHARK, Carols Clemence, MD [facility]

## 2024-05-03 NOTE — Progress Notes (Signed)
  Progress Note   Patient: [name] FMW:[id number] DOB: 1971-07-10 DOA: 05/02/2024     1 DOS: the patient was seen and examined on 05/03/2024   Brief hospital course: [name] is a 53 y.o. female with medical history significant of multiple sclerosis with chronic left-sided weakness neurogenic bladder status post ileal conduit chronic indwelling catheter, recurrent MDR UTIs, HTN, HFpEF, anxiety/depression, brought in by family member for worsening of mentation changes.    Principal Problem:   Encephalopathy Active Problems:   Acute metabolic encephalopathy   Multiple sclerosis, primary progressive (HCC)   Paraplegia (HCC)   Chronic diastolic CHF (congestive heart failure) (HCC)   Primary hypertension   UTI (urinary tract infection)   Assessment and Plan: Acute metabolic encephalopathy. Progressive multiple sclerosis, possible flareup. Paraplegia with bedbound status. Neurogenic bladder status post ileal conduit with chronic Enterococcus colonization Discussed with patient and mother and patient's sister, patient at baseline is bedbound, but she cannot maintain a conversation.  Mental status much worsened for the last 2 or 3 days.  Currently, patient is not talking, she refused to take her medicines and refused to eat. EEG did not show any seizure activity. Will consult neurology for consideration of a flareup of MS. Patient has ileal conduit, bacterial colonization is very common.  However, given patient leukocytosis, potential high-dose of steroid use.    Discussed with Dr. Searcy, will rehydrate first.  If mental status does not improve with rehydration, may consider Unasyn.   Essential hypertension. Blood pressure is running high, will start blood pressure medicine once patient able to take.  Will start as needed blood pressure medicine for now.        Subjective:  Normal BP patient opening eyes, but does not answer questions.  Refused taking her medicine or  eat.  Physical Exam: Vitals:   05/02/24 2230 05/03/24 0230 05/03/24 0630 05/03/24 1030  BP: (!) 128/112 (!) 173/88 (!) 182/100 (!) 171/77  Pulse: 78 93 91 87  Resp: 18 20 18 18   Temp: 98 F (36.7 C) 98.9 F (37.2 C) 97.9 F (36.6 C) 98 F (36.7 C)  TempSrc:  Oral Oral Axillary  SpO2:   100% 95%  Weight:      Height:       General exam: Appears calm and comfortable  Respiratory system: Clear to auscultation. Respiratory effort normal. Cardiovascular system: S1 & S2 heard, RRR. No JVD, murmurs, rubs, gallops or clicks. No pedal edema. Gastrointestinal system: Abdomen is nondistended, soft and nontender. No organomegaly or masses felt. Normal bowel sounds heard. Central nervous system: Alert and does not talk, able to utter a few words. Extremities: Symmetric 5 x 5 power. Skin: No rashes, lesions or ulcers Psychiatry: Flat affect   Data Reviewed:  Due to prior MRI results, Ct head results, reviewed most recent echocardiogram sounds, reviewed all lab results.  Family Communication: Discussed with patient mother and sister.  Disposition: Status is: Inpatient Severity of disease, IV treatment, inpatient procedure     Time spent: 60 minutes  Author: Murvin Mana, MD 05/03/2024 12:13 PM  For on call review [email or web address].

## 2024-05-04 DIAGNOSIS — I10 Essential (primary) hypertension: Secondary | ICD-10-CM | POA: Diagnosis not present

## 2024-05-04 DIAGNOSIS — I5032 Chronic diastolic (congestive) heart failure: Secondary | ICD-10-CM | POA: Diagnosis not present

## 2024-05-04 DIAGNOSIS — G35 Multiple sclerosis: Secondary | ICD-10-CM | POA: Diagnosis not present

## 2024-05-04 DIAGNOSIS — G9341 Metabolic encephalopathy: Secondary | ICD-10-CM | POA: Diagnosis not present

## 2024-05-04 LAB — PHOSPHORUS: Phosphorus: 3.9 mg/dL (ref 2.5–4.6)

## 2024-05-04 LAB — BASIC METABOLIC PANEL WITH GFR
Anion gap: 12 (ref 5–15)
BUN: 19 mg/dL (ref 6–20)
CO2: 24 mmol/L (ref 22–32)
Calcium: 9.4 mg/dL (ref 8.9–10.3)
Chloride: 105 mmol/L (ref 98–111)
Creatinine, Ser: 1.1 mg/dL — ABNORMAL HIGH (ref 0.44–1.00)
GFR, Estimated: 60 mL/min — ABNORMAL LOW (ref >60)
Glucose, Bld: 92 mg/dL (ref 70–99)
Potassium: 4.5 mmol/L (ref 3.5–5.1)
Sodium: 141 mmol/L (ref 135–145)

## 2024-05-04 LAB — CBC
HCT: 36.5 % (ref 36.0–46.0)
Hemoglobin: 11.8 g/dL — ABNORMAL LOW (ref 12.0–15.0)
MCH: 29.4 pg (ref 26.0–34.0)
MCHC: 32.3 g/dL (ref 30.0–36.0)
MCV: 91 fL (ref 80.0–100.0)
Platelets: 536 K/uL — ABNORMAL HIGH (ref 150–400)
RBC: 4.01 MIL/uL (ref 3.87–5.11)
RDW: 15.1 % (ref 11.5–15.5)
WBC: 15.8 K/uL — ABNORMAL HIGH (ref 4.0–10.5)
nRBC: 0 % (ref 0.0–0.2)

## 2024-05-04 LAB — MAGNESIUM: Magnesium: 2.3 mg/dL (ref 1.7–2.4)

## 2024-05-04 LAB — PROCALCITONIN: Procalcitonin: <0.1 ng/mL

## 2024-05-04 MED ORDER — OXYCODONE-ACETAMINOPHEN 5-325 MG PO TABS
1.0000 | ORAL_TABLET | Freq: Four times a day (QID) | ORAL | Status: DC | PRN
  Administered 2024-05-04: 1 via ORAL
  Filled 2024-05-04: qty 1

## 2024-05-04 MED ORDER — FLEET ENEMA RE ENEM
1.0000 | ENEMA | Freq: Once | RECTAL | Status: AC
  Administered 2024-05-04: 1 via RECTAL

## 2024-05-04 MED ORDER — POLYETHYLENE GLYCOL 3350 17 G PO PACK
17.0000 g | PACK | Freq: Every day | ORAL | Status: DC
  Administered 2024-05-05 – 2024-05-08 (×7): 17 g via ORAL
  Filled 2024-05-04 (×5): qty 1

## 2024-05-04 MED ORDER — LACTULOSE 10 GM/15ML PO SOLN
20.0000 g | Freq: Once | ORAL | Status: AC
  Administered 2024-05-04: 20 g via ORAL
  Filled 2024-05-04: qty 30

## 2024-05-04 MED ORDER — SODIUM CHLORIDE 0.9 % IV SOLN
3.0000 g | Freq: Four times a day (QID) | INTRAVENOUS | Status: DC
  Administered 2024-05-04 – 2024-05-10 (×35): 3 g via INTRAVENOUS
  Filled 2024-05-04 (×26): qty 8

## 2024-05-04 MED ORDER — DEXTROSE IN LACTATED RINGERS 5 % IV SOLN: INTRAVENOUS | Status: DC

## 2024-05-04 NOTE — Plan of Care (Signed)

## 2024-05-04 NOTE — Progress Notes (Addendum)
  Progress Note   Patient: [name] FMW:[id number] DOB: July 13, 1971 DOA: 05/02/2024     2 DOS: the patient was seen and examined on 05/04/2024   Brief hospital course: [name] is a 53 y.o. female with medical history significant of multiple sclerosis with chronic left-sided weakness neurogenic bladder status post ileal conduit chronic indwelling catheter, recurrent MDR UTIs, HTN, HFpEF, anxiety/depression, brought in by family member for worsening of mentation changes.    Principal Problem:   Encephalopathy Active Problems:   Acute metabolic encephalopathy   Multiple sclerosis, primary progressive (HCC)   Paraplegia (HCC)   Chronic diastolic CHF (congestive heart failure) (HCC)   Primary hypertension   UTI (urinary tract infection)   Assessment and Plan: Acute metabolic encephalopathy. Progressive multiple sclerosis, possible flareup. Paraplegia with bedbound status. Neurogenic bladder status post ileal conduit with chronic Enterococcus colonization Discussed with patient and mother and patient's sister, patient at baseline is bedbound, but she cannot maintain a conversation.  Mental status much worsened for the last 2 or 3 days.  Currently, patient is not talking, she refused to take her medicines and refused to eat. EEG did not show any seizure activity. Patient has ileal conduit, bacterial colonization is very common.   Discussed with Dr. Searcy, will rehydrate first.  Patient mental status seems to be better this morning, she was complaining of pain, she was given lower dose oxycodone .  She became more sleepy.  This appeared to be an effect of pain medicine.  I we will hold off additional pain medicine. For now, I will continue rehydration.  Recheck a procalcitonin level less than 0.1, the probability of UTI is low.  I will change IV fluids to D5 with lactated ringer  to prevent hypoglycemia.  Patient mental status still is not improving, will start abx with unasyn .      Essential hypertension. Blood pressure was running high this morning when she was complaining of pain.  Pain appears to be muscular.  Chronic diastolic congestive heart failure. Patient is volume depleted, no exacerbation.         Subjective:  Patient mental status appears to be better this morning, but she was complaining of pain, give her low-dose Percocet, she is sleepy now.  Physical Exam: Vitals:   05/04/24 0410 05/04/24 0727 05/04/24 0736 05/04/24 1134  BP: (!) 170/91  (!) 180/77 116/62  Pulse: 79 77 78 90  Resp: 18 18    Temp: 97.9 F (36.6 C) 97.6 F (36.4 C)    TempSrc: Oral     SpO2: 98% 100%    Weight:      Height:       General exam: Appears calm and comfortable  Respiratory system: Clear to auscultation. Respiratory effort normal. Cardiovascular system: S1 & S2 heard, RRR. No JVD, murmurs, rubs, gallops or clicks. No pedal edema. Gastrointestinal system: Abdomen is nondistended, soft and nontender. No organomegaly or masses felt. Normal bowel sounds heard. Central nervous system: Alert this morning, drowsy now.  And oriented x1.  Paraplegia  extremities: Symmetric 5 x 5 power. Skin: No rashes, lesions or ulcers Psychiatry: Flat affect   Data Reviewed:  Lab results reviewed.  Family Communication: Sister updated at the bedside.  Disposition: Status is: Inpatient Remains inpatient appropriate because: Severity of disease, altered mental status.     Time spent: 50 minutes  Author: Murvin Mana, MD 05/04/2024 12:47 PM  For on call review [email or web address].

## 2024-05-05 DIAGNOSIS — G35 Multiple sclerosis: Secondary | ICD-10-CM | POA: Diagnosis not present

## 2024-05-05 DIAGNOSIS — E876 Hypokalemia: Secondary | ICD-10-CM

## 2024-05-05 DIAGNOSIS — G9341 Metabolic encephalopathy: Secondary | ICD-10-CM | POA: Diagnosis not present

## 2024-05-05 LAB — BASIC METABOLIC PANEL WITH GFR
Anion gap: 8 (ref 5–15)
BUN: 15 mg/dL (ref 6–20)
CO2: 25 mmol/L (ref 22–32)
Calcium: 8.6 mg/dL — ABNORMAL LOW (ref 8.9–10.3)
Chloride: 107 mmol/L (ref 98–111)
Creatinine, Ser: 0.99 mg/dL (ref 0.44–1.00)
GFR, Estimated: >60 mL/min (ref >60)
Glucose, Bld: 93 mg/dL (ref 70–99)
Potassium: 3.2 mmol/L — ABNORMAL LOW (ref 3.5–5.1)
Sodium: 140 mmol/L (ref 135–145)

## 2024-05-05 LAB — CBC
HCT: 32.7 % — ABNORMAL LOW (ref 36.0–46.0)
Hemoglobin: 10.7 g/dL — ABNORMAL LOW (ref 12.0–15.0)
MCH: 29.9 pg (ref 26.0–34.0)
MCHC: 32.7 g/dL (ref 30.0–36.0)
MCV: 91.3 fL (ref 80.0–100.0)
Platelets: 475 K/uL — ABNORMAL HIGH (ref 150–400)
RBC: 3.58 MIL/uL — ABNORMAL LOW (ref 3.87–5.11)
RDW: 15.3 % (ref 11.5–15.5)
WBC: 11.6 K/uL — ABNORMAL HIGH (ref 4.0–10.5)
nRBC: 0 % (ref 0.0–0.2)

## 2024-05-05 LAB — MAGNESIUM: Magnesium: 2.2 mg/dL (ref 1.7–2.4)

## 2024-05-05 MED ORDER — KCL IN DEXTROSE-NACL 20-5-0.45 MEQ/L-%-% IV SOLN
INTRAVENOUS | Status: DC
  Filled 2024-05-05 (×3): qty 1000

## 2024-05-05 MED ORDER — POTASSIUM CHLORIDE 10 MEQ/100ML IV SOLN
10.0000 meq | INTRAVENOUS | Status: AC
  Administered 2024-05-05 (×2): 10 meq via INTRAVENOUS
  Filled 2024-05-05: qty 100

## 2024-05-05 NOTE — Progress Notes (Addendum)
  Progress Note   Patient: [name] FMW:[id number] DOB: [birth year] DOA: 05/02/2024     3 DOS: the patient was seen and examined on 05/05/2024   Brief hospital course: HELENA SARDO is a 53 y.o. female with medical history significant of multiple sclerosis with chronic left-sided weakness neurogenic bladder status post ileal conduit chronic indwelling catheter, recurrent MDR UTIs, HTN, HFpEF, anxiety/depression, brought in by family member for worsening of mentation changes.    Principal Problem:   Encephalopathy Active Problems:   Acute metabolic encephalopathy   Multiple sclerosis, primary progressive (HCC)   Paraplegia (HCC)   Chronic diastolic CHF (congestive heart failure) (HCC)   Primary hypertension   UTI (urinary tract infection)   Hypokalemia   Assessment and Plan:  Acute metabolic encephalopathy. Progressive multiple sclerosis Paraplegia with bedbound status. Neurogenic bladder status post ileal conduit with chronic Enterococcus colonization Discussed with patient and mother and patient's sister, patient at baseline is bedbound, but she cannot maintain a conversation.  Mental status much worsened for the last 2 or 3 days.  Currently, patient is not talking, she refused to take her medicines and refused to eat. EEG did not show any seizure activity. Patient has ileal conduit, bacterial colonization is very common.   Discussed with Dr. Searcy, will rehydrate first.  Mental status did not improve, as a result, she was placed on antibiotics with Unasyn  8/9.  Patient seems to be doing better today, she was able to eat a few bites, she seemed to be talking better. Continue IV fluids and antibiotics for now.  Hypokalemia. Repleted, recheck levels tomorrow.     Essential hypertension. Continue as needed hydralazine .   Chronic diastolic congestive heart failure. Patient is volume depleted, no exacerbation.     Subjective:  Patient is starting to wake up today,  she is talking more, she started have a few bites.  Physical Exam: Vitals:   05/04/24 2055 05/05/24 0513 05/05/24 0657 05/05/24 0840  BP: (!) 151/66 (!) 197/85 131/63 (!) 164/73  Pulse: 84 85 91 93  Resp: 18 16  16   Temp: (!) 97.5 F (36.4 C) 97.8 F (36.6 C)  97.6 F (36.4 C)  TempSrc:  Oral    SpO2: 99% 97%  99%  Weight:      Height:       General exam: Appears calm and comfortable  Respiratory system: Clear to auscultation. Respiratory effort normal. Cardiovascular system: S1 & S2 heard, RRR. No JVD, murmurs, rubs, gallops or clicks. No pedal edema. Gastrointestinal system: Abdomen is nondistended, soft and nontender. No organomegaly or masses felt. Normal bowel sounds heard. Central nervous system: Alert and confused.  Paraplegia Extremities: Symmetric 5 x 5 power. Skin: No rashes, lesions or ulcers Psychiatry: Flat affect  Data Reviewed:  Lab results reviewed.  Family Communication: Sister updated at the bedside  Disposition: Status is: Inpatient Remains inpatient appropriate because: Severity of disease, IV treatment, altered mental status     Time spent: 35 minutes  Author: Murvin Mana, MD 05/05/2024 11:10 AM  For on call review [email or web address].

## 2024-05-05 NOTE — Care Management Important Message (Signed)
 Important Message  Patient Details  Name: [name] MRN: [id number] Date of Birth: 1970/10/12   Important Message Given:  Yes - Medicare IM     Xayvier Vallez W, CMA 05/05/2024, 10:42 AM

## 2024-05-05 NOTE — Plan of Care (Signed)
  Problem: Health Behavior/Discharge Planning: Goal: Ability to manage health-related needs will improve Outcome: Progressing   Problem: Clinical Measurements: Goal: Ability to maintain clinical measurements within normal limits will improve Outcome: Progressing Goal: Will remain free from infection Outcome: Progressing Goal: Respiratory complications will improve Outcome: Progressing Goal: Cardiovascular complication will be avoided Outcome: Progressing   

## 2024-05-05 NOTE — Plan of Care (Signed)

## 2024-05-06 ENCOUNTER — Inpatient Hospital Stay

## 2024-05-06 DIAGNOSIS — G9341 Metabolic encephalopathy: Secondary | ICD-10-CM | POA: Diagnosis not present

## 2024-05-06 DIAGNOSIS — G35 Multiple sclerosis: Secondary | ICD-10-CM | POA: Diagnosis not present

## 2024-05-06 DIAGNOSIS — I5032 Chronic diastolic (congestive) heart failure: Secondary | ICD-10-CM | POA: Diagnosis not present

## 2024-05-06 LAB — MAGNESIUM: Magnesium: 1.9 mg/dL (ref 1.7–2.4)

## 2024-05-06 LAB — BASIC METABOLIC PANEL WITH GFR
Anion gap: 9 (ref 5–15)
BUN: 12 mg/dL (ref 6–20)
CO2: 23 mmol/L (ref 22–32)
Calcium: 8.7 mg/dL — ABNORMAL LOW (ref 8.9–10.3)
Chloride: 108 mmol/L (ref 98–111)
Creatinine, Ser: 1.04 mg/dL — ABNORMAL HIGH (ref 0.44–1.00)
GFR, Estimated: >60 mL/min (ref >60)
Glucose, Bld: 107 mg/dL — ABNORMAL HIGH (ref 70–99)
Potassium: 3.9 mmol/L (ref 3.5–5.1)
Sodium: 140 mmol/L (ref 135–145)

## 2024-05-06 MED ORDER — ENSURE PLUS HIGH PROTEIN PO LIQD
237.0000 mL | Freq: Two times a day (BID) | ORAL | Status: DC
  Administered 2024-05-06 – 2024-05-08 (×8): 237 mL via ORAL

## 2024-05-06 MED ORDER — LACTATED RINGERS IV SOLN: INTRAVENOUS | Status: DC

## 2024-05-06 MED ORDER — GADOBUTROL 1 MMOL/ML IV SOLN
7.0000 mL | Freq: Once | INTRAVENOUS | Status: AC | PRN
  Administered 2024-05-06 (×2): 7 mL via INTRAVENOUS

## 2024-05-06 NOTE — Plan of Care (Signed)

## 2024-05-06 NOTE — Progress Notes (Addendum)
  Progress Note   Patient: [name] FMW:[id number] DOB: 1971-04-27 DOA: 05/02/2024     4 DOS: the patient was seen and examined on 05/06/2024   Brief hospital course: [name] is a 53 y.o. female with medical history significant of multiple sclerosis with chronic left-sided weakness neurogenic bladder status post ileal conduit chronic indwelling catheter, recurrent MDR UTIs, HTN, HFpEF, anxiety/depression, brought in by family member for worsening of mentation changes.    Principal Problem:   Encephalopathy Active Problems:   Acute metabolic encephalopathy   Multiple sclerosis, primary progressive (HCC)   Paraplegia (HCC)   Chronic diastolic CHF (congestive heart failure) (HCC)   Primary hypertension   UTI (urinary tract infection)   Hypokalemia   Assessment and Plan: Acute metabolic encephalopathy. Progressive multiple sclerosis Paraplegia with bedbound status. Neurogenic bladder status post ileal conduit with chronic Enterococcus colonization Discussed with patient and mother and patient's sister, patient at baseline is bedbound, but she cannot maintain a conversation.  Mental status much worsened for the last 2 or 3 days.  Currently, patient is not talking, she refused to take her medicines and refused to eat. EEG did not show any seizure activity. Patient has ileal conduit, bacterial colonization is very common.   Discussed with Dr. Searcy, will rehydrate first.  Mental status did not improve, as a result, she was placed on antibiotics with Unasyn  8/9.  Patient seems to be doing better, but still not at the baseline.  I will continue antibiotics and IV fluids.  Addendum.  Patient became very sleepy again in the afternoon, she has been treated with 2 days of IV fluids and antibiotics, ask neurology to reevaluate.  Consider possibility of MRI.   Hypokalemia. Acute kidney injury secondary to dehydration. Potassium has normalized.  Renal function not at baseline.   Continue fluids.   Essential hypertension. Continue as needed hydralazine .   Chronic diastolic congestive heart failure. Patient is volume depleted, no exacerbation.       Subjective:  Patient still has significant confusion, but more awake, able to eat small meals.  Physical Exam: Vitals:   05/05/24 1951 05/06/24 0413 05/06/24 0540 05/06/24 0723  BP: 134/62 (!) 187/93 (!) 122/54 (!) 169/80  Pulse: 74 87 80 91  Resp:    20  Temp: (!) 96.9 F (36.1 C) 98.4 F (36.9 C)  97.8 F (36.6 C)  TempSrc:  Axillary    SpO2: 98% 97%  98%  Weight:      Height:       General exam: Appears calm and comfortable  Respiratory system: Clear to auscultation. Respiratory effort normal. Cardiovascular system: S1 & S2 heard, RRR. No JVD, murmurs, rubs, gallops or clicks. No pedal edema. Gastrointestinal system: Abdomen is nondistended, soft and nontender. No organomegaly or masses felt. Normal bowel sounds heard. Central nervous system: Alert and oriented x1.  Left-sided weakness Extremities: Symmetric 5 x 5 power. Skin: No rashes, lesions or ulcers Psychiatry: Flat affect   Data Reviewed:  Results reviewed.  Family Communication: Brother updated at the bedside.  Disposition: Status is: Inpatient Remains inpatient appropriate because: Severity of disease, IV treatment, altered mental status     Time spent: 35 minutes  Author: Murvin Mana, MD 05/06/2024 11:30 AM  For on call review [email or web address].

## 2024-05-06 NOTE — TOC Progression Note (Signed)
 Transition of Care [facility]) - Progression Note    Patient Details  Name: [name] MRN: [id number] Date of Birth: July 05, 1971  Transition of [facility]) CM/SW Contact  Dalia GORMAN Fuse, RN Phone Number: 05/06/2024, 2:03 PM  Clinical Narrative:    TOC met with the patient and her mother and other family in the room. The patient was not awake. The patient is from home with her mother who is also disable. She has caregivers in the home for 6.5 hours each day. Her mother is primarily responsible for her care at night. The patient is not ambulatory and relies on caregivers for all ADLS. She has DME: hospital bed, hoyer lift, and an electric chair. She is typically transported via EMS. The patient's mother has concerns about her being discharged too soon and wants to ensure the medical team is aware the patient is not at her baseline.  Ultimately the patient's mom would like more assistance in the home. The caregiver is paid for by [employer], TOC asked if she can request additional hours for her daughter and she advised she was recently increased from 3 to 6.5 hours. Private pay for additional support is not an option. Per the chart review the patient was active with [facility] in 2025. TOC will continue to follow.                                                                                                                                                   Expected Discharge Plan and Services                                               Social Drivers of Health (SDOH) Interventions SDOH Screenings   Food Insecurity: Patient Unable To Answer (05/02/2024)  Housing: Patient Unable To Answer (05/02/2024)  Transportation Needs: Patient Unable To Answer (05/02/2024)  Utilities: Patient Unable To Answer (05/02/2024)  Financial Resource Strain: Low Risk  (01/24/2024)   Received from [facility]  Physical Activity: Inactive (11/03/2022)   Received from [facility]  Social  Connections: Moderately Isolated (11/03/2022)   Received from [facility]  Stress: No Stress Concern Present (11/03/2022)   Received from [facility]  Tobacco Use: Low Risk  (05/02/2024)  Health Literacy: Low Risk  (04/05/2024)   Received from [facility]    Readmission Risk Interventions     No data to display

## 2024-05-06 NOTE — Plan of Care (Signed)

## 2024-05-07 ENCOUNTER — Inpatient Hospital Stay

## 2024-05-07 DIAGNOSIS — G9341 Metabolic encephalopathy: Secondary | ICD-10-CM | POA: Diagnosis not present

## 2024-05-07 DIAGNOSIS — I10 Essential (primary) hypertension: Secondary | ICD-10-CM | POA: Diagnosis not present

## 2024-05-07 DIAGNOSIS — G35 Multiple sclerosis: Secondary | ICD-10-CM | POA: Diagnosis not present

## 2024-05-07 LAB — BASIC METABOLIC PANEL WITH GFR
Anion gap: 14 (ref 5–15)
BUN: 11 mg/dL (ref 6–20)
CO2: 24 mmol/L (ref 22–32)
Calcium: 8.9 mg/dL (ref 8.9–10.3)
Chloride: 105 mmol/L (ref 98–111)
Creatinine, Ser: 0.93 mg/dL (ref 0.44–1.00)
GFR, Estimated: >60 mL/min (ref >60)
Glucose, Bld: 98 mg/dL (ref 70–99)
Potassium: 3.9 mmol/L (ref 3.5–5.1)
Sodium: 143 mmol/L (ref 135–145)

## 2024-05-07 LAB — CBC
HCT: 35.8 % — ABNORMAL LOW (ref 36.0–46.0)
Hemoglobin: 11.2 g/dL — ABNORMAL LOW (ref 12.0–15.0)
MCH: 28.8 pg (ref 26.0–34.0)
MCHC: 31.3 g/dL (ref 30.0–36.0)
MCV: 92 fL (ref 80.0–100.0)
Platelets: 539 K/uL — ABNORMAL HIGH (ref 150–400)
RBC: 3.89 MIL/uL (ref 3.87–5.11)
RDW: 15.8 % — ABNORMAL HIGH (ref 11.5–15.5)
WBC: 12.5 K/uL — ABNORMAL HIGH (ref 4.0–10.5)
nRBC: 0 % (ref 0.0–0.2)

## 2024-05-07 LAB — PHOSPHORUS: Phosphorus: 3.5 mg/dL (ref 2.5–4.6)

## 2024-05-07 LAB — MAGNESIUM: Magnesium: 1.9 mg/dL (ref 1.7–2.4)

## 2024-05-07 LAB — BRAIN NATRIURETIC PEPTIDE: B Natriuretic Peptide: 55.6 pg/mL (ref 0.0–100.0)

## 2024-05-07 MED ORDER — LACTULOSE 10 GM/15ML PO SOLN
20.0000 g | Freq: Two times a day (BID) | ORAL | Status: DC
  Administered 2024-05-07 – 2024-05-08 (×4): 20 g via ORAL
  Filled 2024-05-07 (×3): qty 30

## 2024-05-07 MED ORDER — METOPROLOL TARTRATE 25 MG PO TABS
25.0000 mg | ORAL_TABLET | Freq: Two times a day (BID) | ORAL | Status: DC
  Administered 2024-05-07 (×4): 25 mg via ORAL
  Filled 2024-05-07 (×2): qty 1

## 2024-05-07 MED ORDER — IPRATROPIUM-ALBUTEROL 0.5-2.5 (3) MG/3ML IN SOLN: 3.0000 mL | Freq: Four times a day (QID) | RESPIRATORY_TRACT | Status: DC | PRN

## 2024-05-07 MED ORDER — LACTATED RINGERS IV SOLN: INTRAVENOUS | Status: AC

## 2024-05-07 MED ORDER — FLEET ENEMA RE ENEM
1.0000 | ENEMA | Freq: Once | RECTAL | Status: AC
  Administered 2024-05-07 (×2): 1 via RECTAL

## 2024-05-07 NOTE — Telephone Encounter (Signed)
 I returned a call to the pt sister and she states that the pt is currently admitted to [facility] with altered mental status and the staff there can not seem to give a reason to the family as to what is causing the problem.  She states that the family has requested a transfer but the current admitting team declined to consider transfer.  I rec to the pt that they cont to ask what the next steps for their sister is.  Any rec for the family?

## 2024-05-07 NOTE — Progress Notes (Addendum)
 Progress Note   Patient: [name] FMW:[id number] DOB: November 22, 1970 DOA: 05/02/2024     5 DOS: the patient was seen and examined on 05/07/2024   Brief hospital course: [name] is a 53 y.o. female with medical history significant of multiple sclerosis with chronic left-sided weakness neurogenic bladder status post ileal conduit chronic indwelling catheter, recurrent MDR UTIs, HTN, HFpEF, anxiety/depression, brought in by family member for worsening of mentation changes.    Principal Problem:   Encephalopathy Active Problems:   Acute metabolic encephalopathy   Multiple sclerosis, primary progressive (HCC)   Paraplegia (HCC)   Chronic diastolic CHF (congestive heart failure) (HCC)   Primary hypertension   UTI (urinary tract infection)   Hypokalemia   Assessment and Plan: Acute metabolic encephalopathy. Progressive multiple sclerosis Paraplegia with bedbound status. Neurogenic bladder status post ileal conduit with chronic Enterococcus colonization Discussed with patient and mother and patient's sister, patient at baseline is bedbound, but she cannot maintain a conversation.  Mental status much worsened for the last 2 or 3 days.  Currently, patient is not talking, she refused to take her medicines and refused to eat. EEG did not show any seizure activity. Patient has ileal conduit, bacterial colonization is very common.   Discussed with Dr. Searcy, will rehydrate first.  Mental status did not improve, as a result, she was placed on antibiotics with Unasyn  8/9.  Due to persistence of symptoms, MRI of the brain with contrast was obtained, 8/11, did not show worsening MS. She is continued on fluids and Unasyn .  Her condition finally improving, she is able to eat small amount of food, she is less confused.  But she still spit out water food and medications repeatedly.  I will continue current antibiotics, continue iv fluids.    Hypokalemia. Acute kidney injury secondary to  dehydration. Potassium has normalized.  Renal function not at baseline.  Continue fluids.   Essential hypertension. Blood pressure running high with some tachycardia.  Added metoprolol .   Chronic diastolic congestive heart failure. Patient is volume depleted, no exacerbation. Since she started eating, reduce the rate of fluids to 50 mL/h.  Overweight BMI 27.29.  Constipation. Last known bowel movement was 7/30, patient has received stool softener, still has no bowel movement.  Does not have abdominal pain or nausea vomiting.  Will give lactulose  and enema     Subjective:  Patient is more awake, able to talk better.  Physical Exam: Vitals:   05/07/24 0018 05/07/24 0320 05/07/24 0431 05/07/24 1016  BP: (!) 159/144 (!) 167/94 (!) 154/66 (!) 175/69  Pulse: (!) 109 (!) 106 (!) 109 (!) 106  Resp: 18  18 19   Temp: 97.9 F (36.6 C)  98.3 F (36.8 C) 98.2 F (36.8 C)  TempSrc:   Oral Oral  SpO2: 99%  98% 99%  Weight:      Height:       General exam: Appears calm and comfortable  Respiratory system: Clear to auscultation. Respiratory effort normal. Cardiovascular system: S1 & S2 heard, RRR. No JVD, murmurs, rubs, gallops or clicks. No pedal edema. Gastrointestinal system: Abdomen is nondistended, soft and nontender. No organomegaly or masses felt. Normal bowel sounds heard. Central nervous system: Alert and oriented x2.  Left-sided weakness. Extremities: Symmetric 5 x 5 power. Skin: No rashes, lesions or ulcers Psychiatry:  Mood & affect appropriate.    Data Reviewed:  Lab results reviewed.  Family Communication: Mother and mother updated at bedside separately.  Disposition: Status is: Inpatient Remains inpatient  appropriate because: Severity of disease, IV treatment, altered mental status.     Time spent: 50 minutes  Author: Murvin Mana, MD 05/07/2024 11:12 AM  For on call review [email or web address].

## 2024-05-07 NOTE — Plan of Care (Signed)
   Problem: Health Behavior/Discharge Planning: Goal: Ability to manage health-related needs will improve Outcome: Progressing   Problem: Clinical Measurements: Goal: Ability to maintain clinical measurements within normal limits will improve Outcome: Progressing Goal: Will remain free from infection Outcome: Progressing Goal: Diagnostic test results will improve Outcome: Progressing Goal: Respiratory complications will improve Outcome: Progressing

## 2024-05-08 DIAGNOSIS — R638 Other symptoms and signs concerning food and fluid intake: Secondary | ICD-10-CM

## 2024-05-08 DIAGNOSIS — Z515 Encounter for palliative care: Secondary | ICD-10-CM

## 2024-05-08 DIAGNOSIS — Z7189 Other specified counseling: Secondary | ICD-10-CM

## 2024-05-08 DIAGNOSIS — G9340 Encephalopathy, unspecified: Secondary | ICD-10-CM | POA: Diagnosis not present

## 2024-05-08 LAB — MAGNESIUM: Magnesium: 2.1 mg/dL (ref 1.7–2.4)

## 2024-05-08 LAB — CULTURE, BLOOD (ROUTINE X 2)
Culture: NO GROWTH
Culture: NO GROWTH
Special Requests: ADEQUATE

## 2024-05-08 LAB — BASIC METABOLIC PANEL WITH GFR
Anion gap: 12 (ref 5–15)
BUN: 13 mg/dL (ref 6–20)
CO2: 24 mmol/L (ref 22–32)
Calcium: 8.7 mg/dL — ABNORMAL LOW (ref 8.9–10.3)
Chloride: 110 mmol/L (ref 98–111)
Creatinine, Ser: 0.94 mg/dL (ref 0.44–1.00)
GFR, Estimated: >60 mL/min (ref >60)
Glucose, Bld: 84 mg/dL (ref 70–99)
Potassium: 3.3 mmol/L — ABNORMAL LOW (ref 3.5–5.1)
Sodium: 146 mmol/L — ABNORMAL HIGH (ref 135–145)

## 2024-05-08 LAB — CBC
HCT: 34.4 % — ABNORMAL LOW (ref 36.0–46.0)
Hemoglobin: 10.9 g/dL — ABNORMAL LOW (ref 12.0–15.0)
MCH: 28.9 pg (ref 26.0–34.0)
MCHC: 31.7 g/dL (ref 30.0–36.0)
MCV: 91.2 fL (ref 80.0–100.0)
Platelets: 491 K/uL — ABNORMAL HIGH (ref 150–400)
RBC: 3.77 MIL/uL — ABNORMAL LOW (ref 3.87–5.11)
RDW: 15.7 % — ABNORMAL HIGH (ref 11.5–15.5)
WBC: 13.2 K/uL — ABNORMAL HIGH (ref 4.0–10.5)
nRBC: 0 % (ref 0.0–0.2)

## 2024-05-08 MED ORDER — POTASSIUM CHLORIDE 20 MEQ PO PACK
40.0000 meq | PACK | Freq: Once | ORAL | Status: AC
  Administered 2024-05-08 (×2): 40 meq via ORAL
  Filled 2024-05-08: qty 2

## 2024-05-08 MED ORDER — HYDRALAZINE HCL 20 MG/ML IJ SOLN: 5.0000 mg | Freq: Four times a day (QID) | INTRAMUSCULAR | Status: DC | PRN

## 2024-05-08 MED ORDER — METOPROLOL TARTRATE 25 MG PO TABS
12.5000 mg | ORAL_TABLET | Freq: Two times a day (BID) | ORAL | Status: DC
  Administered 2024-05-08 – 2024-05-10 (×7): 12.5 mg via ORAL
  Filled 2024-05-08 (×5): qty 1

## 2024-05-08 MED ORDER — IRBESARTAN 150 MG PO TABS
150.0000 mg | ORAL_TABLET | Freq: Every day | ORAL | Status: DC
  Administered 2024-05-08 (×2): 150 mg via ORAL
  Filled 2024-05-08: qty 1

## 2024-05-08 MED ORDER — IRBESARTAN 150 MG PO TABS: 75.0000 mg | ORAL_TABLET | Freq: Every day | ORAL | Status: DC

## 2024-05-08 MED ORDER — POTASSIUM CHLORIDE CRYS ER 20 MEQ PO TBCR: 40.0000 meq | EXTENDED_RELEASE_TABLET | Freq: Once | ORAL | Status: DC

## 2024-05-08 NOTE — Progress Notes (Addendum)
 Progress Note Patient: [name] FMW:[id number] DOB: 1971-09-20 DOA: 05/02/2024     6 DOS: the patient was seen and examined on 05/08/2024   Brief hospital course: VICTORINE MCNEE is a 53 y.o. female with medical history significant of multiple sclerosis with chronic left-sided weakness, neurogenic bladder status post ileal conduit, chronic indwelling catheter, recurrent MDR UTIs, HTN, HFpEF, anxiety/depression, brought in by family member for worsening of mentation changes.   Assessment and Plan:  Acute metabolic encephalopathy. Progressive multiple sclerosis Paraplegia with bedbound status. Neurogenic bladder status post ileal conduit with chronic Enterococcus colonization patient at baseline is bedbound. EEG did not show any seizure activity. Empirically treating with unasyn  for UTI per guidance by ID as she has not had significant improvement in her mental status with supportive care, brain MRI negative 8/11 for acute findings or worsening of MS. She appears to have some mild cognitive improvement today per reports of her presentation. Will continue treatment.  - palliative consulted.   Poor PO intake- SLP was consulted. Mental status appears to be biggest hindrance to PO intake. Continue IV fluid supplement   Hypokalemia- mild. K+ 3.3 today. Replete and monitor AKI ruled out. Cr normalized    Tachycardia- sinus rhythm. Was started on metoprolol  by prior attending and HR has normalized in 60-80s.  - will continue metoprolol   Essential hypertension. Significantly elevated to 190s systolics - start irbesartan  and titrate up as needed.  UPDATE: BP responded well and has asymptomatic low pressure this afternoon. Will decrease irbesartan  dose for tomorrow and dc the scheduled baclofen    Chronic diastolic congestive heart failure. Patient is volume depleted, no exacerbation.  Overweight BMI 27.29.  Constipation- had a BM yesterday. Continue bowel regimen PRN     Subjective:   Patient reports no pain. Repetitively talking about meeting first lady michelle obama   Physical Exam: Vitals:   05/07/24 0431 05/07/24 1016 05/07/24 2000 05/08/24 0411  BP: (!) 154/66 (!) 175/69 (!) 167/87 (!) 191/86  Pulse: (!) 109 (!) 106 82 69  Resp: 18 19 16 16   Temp: 98.3 F (36.8 C) 98.2 F (36.8 C) 97.8 F (36.6 C) 98.1 F (36.7 C)  TempSrc: Oral Oral    SpO2: 98% 99% 99% 100%  Weight:      Height:       General exam: Appears calm and comfortable  Respiratory system: Clear to auscultation. Respiratory effort normal. Cardiovascular system: S1 & S2 heard, RRR. No JVD, murmurs, rubs, gallops or clicks. No pedal edema. Gastrointestinal system: Abdomen is nondistended, soft and nontender. No organomegaly or masses felt. Normal bowel sounds heard. Central nervous system: Alert and oriented to herself, was not able to answer further questions appropriately.  Left-sided weakness. Skin: No rashes, lesions or ulcers Psychiatry:  repetitive speech and unable to appropriately answer questions. Is able to follow some simple commands   Data Reviewed:    Latest Ref Rng & Units 05/08/2024    4:33 AM 05/07/2024    7:01 AM 05/05/2024    3:45 AM  CBC  WBC 4.0 - 10.5 K/uL 13.2  12.5  11.6   Hemoglobin 12.0 - 15.0 g/dL 89.0  88.7  89.2   Hematocrit 36.0 - 46.0 % 34.4  35.8  32.7   Platelets 150 - 400 K/uL 491  539  475        Latest Ref Rng & Units 05/08/2024    4:33 AM 05/07/2024    7:01 AM 05/06/2024    5:19 AM  BMP  Glucose 70 - 99 mg/dL 84  98  892   BUN 6 - 20 mg/dL 13  11  12    Creatinine 0.44 - 1.00 mg/dL 9.05  9.06  8.95   Sodium 135 - 145 mmol/L 146  143  140   Potassium 3.5 - 5.1 mmol/L 3.3  3.9  3.9   Chloride 98 - 111 mmol/L 110  105  108   CO2 22 - 32 mmol/L 24  24  23    Calcium 8.9 - 10.3 mg/dL 8.7  8.9  8.7     Family Communication: none at bedside  Disposition: Status is: Inpatient Remains inpatient appropriate because: Severity of disease, IV treatment,  altered mental status.     Time spent: 50 minutes  Author: Marien LITTIE Piety, MD 05/08/2024 7:26 AM  For on call review [email or web address].

## 2024-05-08 NOTE — Consult Note (Addendum)
 Consultation Note Date: 05/08/2024   Patient Name: [name]  DOB: 1971/05/17  MRN: [id number]  Age / Sex: 53 y.o., female  PCP: Eino Tinnie BIRCH, MD Referring Physician: Lenon Marien LITTIE, MD  Reason for Consultation: Establishing goals of care  HPI/Patient Profile: 53 y.o. female  with past medical history of multiple sclerosis with chronic left-sided weakness and neurogenic bladder s/p ileal conduit chronic indwelling catheter, recurrent MDR UTIs, HTN, HFpEF, anxiety/depression admitted on 05/02/2024 with worsening mentation. Some small improvements noted after rehydration and beginning antibiotics in the setting of chronic Enterococcus colonization.   Clinical Assessment and Goals of Care: Consult received and chart review completed. I met today with [name] along with her cousin, [name], at bedside. [name] shares that they are more like sisters and family has been very close growing up. [name] shares that [name] lives with her mother who is her main caregiver along with assistance from aides. [name] shares that [name] has had decline with frequent UTIs and recurrent hospitalizations. She shares that [name] has had decline requiring increased care as well as cognitive decline. She reports that it has seemed more than the MS causing her decline.   [name] struggles to focus but is chanting constantly calling for God and saying God is knocking on the door and God give me living water. [name] shares that she has not seen [name] like this before but she has been like this all day today. [name] asks if [name] is trying to transition to end of life - she shares that she believes and the pastor believes this to be the case. I shared that this is concerning for me to hear her like this as well but it is also my first time meeting [name]. I asked [name] about the best way to speak with [name]'s  mother and she shares that her mother plans to be here tomorrow and it would be good to speak in person. [name] also shares that it would be helpful to have other family there for support and that [name] is close with all her siblings.   I called and discussed with mother, [name]. I explained to [name] that I was hoping we could get together to have some important discussions on how to best care for [name]. I expressed that I have some concerns about her health and condition and where we go from here. I encouraged that it would be good to meet together along with other family as well. [name] shares she plans to be present tomorrow at bedside the majority of the day likely beginning at 9:30 am. She asks me to call [name] to coordinate time for other family to be present.   I called and discussed with [name] my hopes for family meeting tomorrow. I share that I have some concerns and difficult decisions that we need to discuss. I expressed some of my concerns and hopes we can discuss how to best care for [name] and decisions that may come up now or in the future. Family will plan to meet  with me tomorrow 8/14 at 1200 noon.   All questions/concerns addressed. Emotional support provided.   Primary Decision Maker NEXT OF KIN mother [name]    SUMMARY OF RECOMMENDATIONS   - Family meeting tomorrow 8/14 1200 noon  Code Status/Advance Care Planning: Full code   Symptom Management:  Per attending  Prognosis:  Overall prognosis concerning.   Discharge Planning: To Be Determined      Primary Diagnoses: Present on Admission:  Acute metabolic encephalopathy  UTI (urinary tract infection)  Encephalopathy  Chronic diastolic CHF (congestive heart failure) (HCC)  Multiple sclerosis, primary progressive (HCC)  Paraplegia (HCC)  Primary hypertension   I have reviewed the medical record, interviewed the patient and family, and examined the patient. The following aspects are pertinent.  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   Edema leg 09/05/2014   History of construction of external stoma of urinary system 04/29/2015   MS (multiple sclerosis) (HCC) 2005   Multiple sclerosis, 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   Neurogenic bowel 08/12/2012   Neuropathic pain 05/21/2015   Social History   Socioeconomic History   Marital status: Single    Spouse name: Not on file   Number of children: Not on file   Years of education: Not on file   Highest education level: Not on file  Occupational History   Not on file  Tobacco Use   Smoking status: Never   Smokeless tobacco: Never  Substance and Sexual Activity   Alcohol use: Not Currently    Comment: socially   Drug use: No   Sexual activity: Not Currently  Other Topics Concern   Not on file  Social History Narrative   Not on file   Social Drivers of Health   Financial Resource Strain: Low Risk  (01/24/2024)   Received from [facility]   Overall Financial Resource Strain (CARDIA)    Difficulty of Paying Living Expenses: Not very hard  Food Insecurity: Patient Unable To Answer (05/02/2024)   Hunger Vital Sign    Worried About Running Out of Food in the Last Year: Patient unable to answer    Ran Out of Food in the Last Year: Patient unable to answer  Transportation Needs: Patient Unable To Answer (05/02/2024)   PRAPARE - Transportation    Lack of Transportation (Medical): Patient unable to answer    Lack of Transportation (Non-Medical): Patient unable to answer  Physical Activity: Inactive (11/03/2022)   Received from [facility]   Exercise Vital Sign    On average, how many days per week do you engage in moderate to strenuous exercise (like a brisk walk)?: 2 days    On average, how many minutes do you engage in exercise at this level?: 0 min  Stress: No Stress Concern Present (11/03/2022)   Received from [facility] - Occupational Stress Questionnaire    Feeling of Stress : Not at all  Social Connections: Moderately Isolated (11/03/2022)   Received from [facility]   Social Connection and Isolation Panel    In a typical week, how many times do you talk on the phone with family, friends, or neighbors?: Three times a week    How often do you get together with friends or relatives?: More than three times a week    How often do you attend church or religious services?: More than 4 times per year  Do you belong to any clubs or organizations such as church groups, unions, fraternal or athletic groups, or school groups?: No    How often do you attend meetings of the clubs or organizations you belong to?: Never    Are you married, widowed, divorced, separated, never married, or living with a partner?: Never married   Family History  Problem Relation Age of Onset   Osteoarthritis Mother    Hypertension Mother    Hyperlipidemia Mother    Thyroid disease Mother    Liver cancer Father    Asthma Brother    Scheduled Meds:  baclofen   20 mg Oral QID   DULoxetine   30 mg Oral Daily   enoxaparin  (LOVENOX ) injection  40 mg Subcutaneous Q24H   feeding supplement  237 mL Oral BID BM   gabapentin   200 mg Oral q AM   And   gabapentin   400 mg Oral BID   irbesartan   150 mg Oral Daily   lactulose   20 g Oral BID   metoprolol  tartrate  12.5 mg Oral BID   pantoprazole   40 mg Oral Daily   polyethylene glycol  17 g Oral Daily   potassium chloride   40 mEq Oral Once   senna  2 tablet Oral BID   Continuous Infusions:  ampicillin -sulbactam (UNASYN ) IV 3 g (05/08/24 0804)   lactated ringers  50 mL/hr at 05/08/24 0801   PRN Meds:.acetaminophen  **OR** acetaminophen , bisacodyl , hydrALAZINE , ipratropium-albuterol , ondansetron  **OR** ondansetron  (ZOFRAN ) IV Allergies  Allergen Reactions   Latex Swelling and Other (See Comments)    Reaction:  Facial swelling    Vancomycin  Itching   Review of Systems  Unable to  perform ROS: Acuity of condition    Physical Exam Vitals and nursing note reviewed.  Constitutional:      Appearance: She is ill-appearing.  Cardiovascular:     Rate and Rhythm: Normal rate.  Pulmonary:     Effort: No tachypnea, accessory muscle usage or respiratory distress.  Abdominal:     Palpations: Abdomen is soft.  Neurological:     Mental Status: She is confused.     Comments: Cannot focus; family member at bedside reported a moment where she recognized her and asked about family member's daughter as well  Chanting to herself     Vital Signs: BP (!) 166/82 (BP Location: Right Arm)   Pulse 73   Temp 98.2 F (36.8 C) (Oral)   Resp 18   Ht 5' 4 (1.626 m)   Wt 72.1 kg   SpO2 100%   BMI 27.29 kg/m  Pain Scale: 0-10   Pain Score: 0-No pain   SpO2: SpO2: 100 % O2 Device:SpO2: 100 % O2 Flow Rate: .O2 Flow Rate (L/min): 2 L/min  IO: Intake/output summary:  Intake/Output Summary (Last 24 hours) at 05/08/2024 9161 Last data filed at 05/08/2024 0805 Gross per 24 hour  Intake 1393.99 ml  Output 1600 ml  Net -206.01 ml    LBM: Last BM Date : 04/24/24 Baseline Weight: Weight: 72.1 kg Most recent weight: Weight: 72.1 kg     Palliative Assessment/Data:    Time Total: 75 min  Greater than 50%  of this time was spent counseling and coordinating care related to the above assessment and plan.  Signed by: Bernarda Kitty, NP Palliative Medicine Team Pager # [phone] (M-F 8a-5p) Team Phone # [phone] (Nights/Weekends)

## 2024-05-08 NOTE — Telephone Encounter (Signed)
 I called and spoke with pt sister.

## 2024-05-08 NOTE — Telephone Encounter (Signed)
 Received message from pt.'s sister, Zandra Lajeunesse. She is wanting to talk to one of her neurology doctors here for clarification as to what is going on with her sister in [facility].  Her sister was admitted to [place] with altered mental status, had a MRI which stated that the patients MRI  is stable and ruled out a stroke. Currently, the patient is being treated for a urinary tract infection. She would like to talk to the neurologist to inquire if it may be her MS.   Informed pt.'s sister that if she would like the patient transferred or discuss the care her sister is receiving at [place], could also ask a patient service representative at [facility] to give her a call.

## 2024-05-08 NOTE — Evaluation (Signed)
 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 female admitted w/ Acute metabolic encephalopathy and UTI (urinary tract infection) with medical history significant of multiple sclerosis(MS) with chronic left-sided weakness, Paraplegia with bedbound status, neurogenic bladder status post ileal conduit chronic indwelling catheter, recurrent MDR UTIs, HTN, HFpEF, anxiety/depression, brought in by family member for worsening of mentation changes.     Sister at bedside reported that since yesterday patient has become more sleepy and when she woke up she has episodes of confusion as well.  This morning, family noticed that the patient is hard to arouse and decided to bring her to the hospital.  Unsure of pt's Baseline Cognitive functioning. MRI on 8/11: Grossly stable appearance of T2/FLAIR hyperintensity involving  the supratentorial cerebral white matter, consistent with history of  demyelinating disease. No abnormal enhancement to suggest active  demyelination on this motion degraded exam.  2. No other acute intracranial abnormality..  Repeat CXRs: Stable chronic bibasilar scarring. No acute cardiopulmonary process. No  active disease..    Per chart review, appears patient was recently admitted 04/26/2024 for UTI and a syncopal episode.  Patient was also admitted 7/27-7/31/2025 acute metabolic encephalopathy in the setting of UTI.   OF NOTE: this is pt's 8th admit to the ED/hospital in ~5 months.    Assessment / Plan / Recommendation  Clinical Impression   Pt seen for BSE this morning. Pt awake, min low volume speech. Pt perseverated on 2-3 different sentences repeating them over and over, including I want my pastor to come give me water; I want michelle obama to feed me. Pt was able to be redirected to po tasks given verbal/tactile/visual cues by SLP and NSG. No family present.  Per previous BSE assessments in chart, pt exhibited Impulsive oral intake behaviors at times: She reports increased epidodes of choking while eating but said her family attributes it to her eating too fast and taking in large bites at a time..  Per MD note, Mental status much worsened for the last 2 or 3 days. Currently, patient is not talking, she refused to take her medicines and refused to eat..  This morning, she is talking and accepting of some po's.  On RA, afebrile.    Pt appears to present w/ functional pharyngeal phase swallowing w/ suspected impact on the Oral phase of swallowing from Cognitive decline in setting of her Chronic illness/comorbidities. No pharyngeal phase dysphagia noted, No neuromuscular deficits noted to impact the oropharyngeal swallow. Pt consumed po trials w/ No overt, clinical s/s of aspiration during po trials.  Pt appears to have risk for aspiration in setting of MS and other comorbidities including Cognitive decline at this time, but this risk is reduced  when following general aspiration precautions and given feeding support using a slightly modified diet of easy to chew foods to best support the oral phase. Pt does have challenging factors that could impact her oropharyngeal swallowing to include:  deconditioning/weakness, need for feeding support at meals, declined Cognitive engagement and follow through; baseline MS; bedbound status; frequent hospitalizations/illness per chart. These factors can increase risk for dysphagia as well as decreased oral intake overall.   During po trials, pt consumed all consistencies w/ no overt coughing, decline in vocal quality, or change in respiratory presentation during/post trials. O2 sats 98% when checked. Oral phase appeared grossly [facility] w/ bolus management, mastication, and control of bolus propulsion for A-P transfer for swallowing. HOWEVER, when pt's Cognitive awareness decreased during po tasks and/or she did not want the bolus, she Expectorated them- this occurred ~10x w/ both foods and liquids. When she did accept and swallow the po trials from her breakfast meal and Ensure, oral clearing was achieved w/ all trial consistencies -- moistened, soft foods given.  OM Exam was cursory but appeared [facility] w/ no unilateral weakness noted. Speech Clear, just mumbled w/ low volume at times. Pt required feeding support and positioning Upright for po intake.   Recommend a more mech soft consistency diet w/ well-Cut/Chopped meats, moistened foods; Thin liquids -- carefully monitor straw use, and pt should help to Hold Cup when drinking. Recommend general aspiration precautions, reduced distractions at meals. Small bites/sips Slowly. Pills CRUSHED vs WHOLE in Puree for safer, easier swallowing -- pt tolerated this well w/ NSG this session. Pills in Puree is encouraged now and for D/C to the NSG.  Education given on Pills in Puree; food consistencies and easy to eat options; general aspiration precautions to pt and NSG. Recommend f/u at her next venue of care for ongoing education as needed. MD/NSG to reconsult if any new needs arise during admit.  Per consultation w/ medical Team, suspect that pt's Cognitive confusion/presentation could calm as the medical issues calm and/or  when pt returns to her known setting. NSG updated, agreed. MD updated. Recommend Dietician f/u for support. Palliative Care consult placed. Precautions posted in room, chart.  SLP Visit Diagnosis: Dysphagia, oral phase (R13.11) (declined Cognitive engagement and follow through; baseline MS; bedbound status; frequent hospitalizations/illness per chart)    Aspiration Risk  Mild aspiration risk;Risk for inadequate nutrition/hydration (reduced following general precautions)    Diet Recommendation   Thin;Dysphagia 3 (mechanical soft) (for ease of cut/chopped meats and moistened foods) = a more mech soft consistency diet w/ well-Cut/Chopped meats, moistened foods; Thin liquids -- carefully monitor straw use, and pt should help to Hold Cup when drinking. Recommend general aspiration precautions, reduced distractions at meals. Small bites/sips Slowly.   Medication Administration: Crushed with puree (vs whole if able)    Other  Recommendations Recommended Consults:  (Palliative Care consult for GOC; Dietician f/u) Oral Care Recommendations: Oral care BID;Oral care before and after PO;Staff/trained caregiver to provide oral care     Assistance Recommended at Discharge  FULL at meals  Functional Status Assessment Patient has had a recent decline in their functional status and demonstrates the ability to make significant improvements in function in a reasonable and predictable amount of time.  Frequency and Duration  (n/a)   (n/a)       Prognosis Prognosis for improved oropharyngeal function: Fair Barriers to Reach Goals: Cognitive deficits;Language deficits;Time post onset;Severity of deficits Barriers/Prognosis Comment: declined Cognitive engagement and follow through; baseline MS; bedbound status; frequent  hospitalizations/illness per chart      Swallow Study   General Date of Onset: 05/02/24 HPI: Pt is a 53 y.o. female admitted w/ Acute metabolic encephalopathy and UTI (urinary tract infection)  with medical history significant of multiple sclerosis(MS) with chronic left-sided weakness, Paraplegia with bedbound status, neurogenic bladder status post ileal conduit chronic indwelling catheter, recurrent MDR UTIs, HTN, HFpEF, anxiety/depression, brought in by family member for worsening of mentation changes.     Sister at bedside reported that since yesterday patient has become more sleepy and when she woke up she has episodes of confusion as well.  This morning, family noticed that the patient is hard to arouse and decided to bring her to the hospital.  Unsure of pt's Baseline Cognitive functioning. MRI on 8/11: Grossly stable appearance of T2/FLAIR hyperintensity involving  the supratentorial cerebral white matter, consistent with history of  demyelinating disease. No abnormal enhancement to suggest active  demyelination on this motion degraded exam.  2. No other acute intracranial abnormality..  Repeat CXRs: Stable chronic bibasilar scarring. No acute cardiopulmonary process. No active disease..    Per chart review, appears patient was recently admitted 04/26/2024 for UTI and a syncopal episode.  Patient was also admitted 7/27-7/31/2025 acute metabolic encephalopathy in the setting of UTI.   OF NOTE: this is pt's 8th admit to the ED/hospital in ~5 months. Type of Study: Bedside Swallow Evaluation Previous Swallow Assessment: 11/2021 and 11/2023- both w/ recommendation for Regular diet, thins w/ general aspiration precautions. Diet Prior to this Study: Dysphagia 3 (mechanical soft);Thin liquids (Level 0) (currently ordered) Temperature Spikes Noted: No (wbc 13.2) Respiratory Status: Room air History of Recent Intubation: No Behavior/Cognition: Alert;Cooperative;Pleasant mood;Confused;Requires cueing;Distractible;Doesn't follow directions (not consistent) Oral Cavity Assessment: Within Functional Limits Oral Care Completed by SLP: Recent completion by staff Oral Cavity - Dentition: Adequate natural  dentition Vision: Functional for self-feeding (appeared when she held cup w/ SLP) Self-Feeding Abilities: Able to feed self;Needs assist;Needs set up;Total assist;Able to feed self with adaptive devices (has used built-up handle utensils per chart) Patient Positioning: Upright in bed (Full support) Baseline Vocal Quality: Normal;Low vocal intensity (min) Volitional Cough: Cognitively unable to elicit Volitional Swallow: Unable to elicit    Oral/Motor/Sensory Function Overall Oral Motor/Sensory Function: Within functional limits (no unilateral weakness during oral intake nor expectoration)   Ice Chips Ice chips: Not tested   Thin Liquid Thin Liquid: Within functional limits Presentation: Self Fed;Straw (held cup w/ pt; ~10 ozs total) Other Comments: water, juice(OJ x2), Ensure    Nectar Thick Nectar Thick Liquid: Not tested   Honey Thick Honey Thick Liquid: Not tested   Puree Puree: Within functional limits (and Impaired intermittently d/t Cognitive decline) Presentation: Spoon (fed; 10+ trials including w/ Crushed Meds by NSG) Other Comments: pt expectorated boluses 4x - Cognitive   Solid     Solid: Impaired (continued impact from Cognitive decline) Presentation: Spoon (fed; 5 trials) Oral Phase Impairments: Poor awareness of bolus Oral Phase Functional Implications:  (expectoration ob boluses x3/4) Pharyngeal Phase Impairments:  (none)        Comer Portugal, MS, CCC-SLP Speech Language Pathologist Rehab Services; [facility] [place] [phone] (ascom) Akin Yi 05/08/2024,3:55 PM

## 2024-05-08 NOTE — Evaluation (Signed)
 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 female admitted w/ Acute metabolic encephalopathy and UTI (urinary tract infection) with medical history significant of multiple sclerosis(MS) with chronic left-sided weakness, Paraplegia with bedbound status, neurogenic bladder status post ileal conduit chronic indwelling catheter, recurrent MDR UTIs, HTN, HFpEF, anxiety/depression, brought in by family member for worsening of mentation changes.     Sister at bedside reported that since yesterday patient has become more sleepy and when she woke up she has episodes of confusion as well.  This morning, family noticed that the patient is hard to arouse and decided to bring her to the hospital.  Unsure of pt's Baseline Cognitive functioning. MRI on 8/11: Grossly stable appearance of T2/FLAIR hyperintensity involving  the supratentorial cerebral white matter, consistent with history of  demyelinating disease. No abnormal enhancement to suggest active  demyelination on this motion degraded exam.  2. No other acute intracranial abnormality..  Repeat CXRs: Stable chronic bibasilar scarring. No acute cardiopulmonary process. No  active disease..    Per chart review, appears patient was recently admitted 04/26/2024 for UTI and a syncopal episode.  Patient was also admitted 7/27-7/31/2025 acute metabolic encephalopathy in the setting of UTI.   OF NOTE: this is pt's 8th admit to the ED/hospital in ~5 months.    Assessment / Plan / Recommendation  Clinical Impression   Pt seen for BSE this morning. Pt awake, min low volume speech. Pt perseverated on 2-3 different sentences repeating them over and over, including I want my pastor to come give me water; I want [name] to feed me. Pt was able to be redirected to po tasks given verbal/tactile/visual cues by SLP and NSG. No family present.  Per previous BSE assessments in chart, pt exhibited Impulsive oral intake behaviors at times: She reports increased epidodes of choking while eating but said her family attributes it to her eating too fast and taking in large bites at a time..  Per MD note, Mental status much worsened for the last 2 or 3 days. Currently, patient is not talking, she refused to take her medicines and refused to eat..  This morning, she is talking and accepting of some po's.  On RA, afebrile.    Pt appears to present w/ functional pharyngeal phase swallowing w/ suspected impact on the Oral phase of swallowing from Cognitive decline in setting of her Chronic illness/comorbidities. No pharyngeal phase dysphagia noted, No neuromuscular deficits noted to impact the oropharyngeal swallow. Pt consumed po trials w/ No overt, clinical s/s of aspiration during po trials.  Pt appears to have risk for aspiration in setting of MS and other comorbidities including Cognitive decline at this time, but this risk is reduced  when following general aspiration precautions and given feeding support using a slightly modified diet of easy to chew foods to best support the oral phase. Pt does have challenging factors that could impact her oropharyngeal swallowing to include:  deconditioning/weakness, need for feeding support at meals, declined Cognitive engagement and follow through; baseline MS; bedbound status; frequent hospitalizations/illness per chart. These factors can increase risk for dysphagia as well as decreased oral intake overall.   During po trials, pt consumed all consistencies w/ no overt coughing, decline in vocal quality, or change in respiratory presentation during/post trials. O2 sats 98% when checked. Oral phase appeared grossly [facility] w/ bolus management, mastication, and control of bolus propulsion for A-P transfer for swallowing. HOWEVER, when pt's Cognitive awareness decreased during po tasks and/or she did not want the bolus, she Expectorated them- this occurred ~10x w/ both foods and liquids. When she did accept and swallow the po trials from her breakfast meal and Ensure, oral clearing was achieved w/ all trial consistencies -- moistened, soft foods given.  OM Exam was cursory but appeared [facility] w/ no unilateral weakness noted. Speech Clear, just mumbled w/ low volume at times. Pt required feeding support and positioning Upright for po intake.   Recommend a more mech soft consistency diet w/ well-Cut/Chopped meats, moistened foods; Thin liquids -- carefully monitor straw use, and pt should help to Hold Cup when drinking. Recommend general aspiration precautions, reduced distractions at meals. Small bites/sips Slowly. Pills CRUSHED vs WHOLE in Puree for safer, easier swallowing -- pt tolerated this well w/ NSG this session. Pills in Puree is encouraged now and for D/C to the NSG.  Education given on Pills in Puree; food consistencies and easy to eat options; general aspiration precautions to pt and NSG. Recommend f/u at her next venue of care for ongoing education as needed. MD/NSG to reconsult if any new needs arise during admit.  Per consultation w/ medical Team, suspect that pt's Cognitive confusion/presentation could calm as the medical issues calm and/or  when pt returns to her known setting. NSG updated, agreed. MD updated. Recommend Dietician f/u for support. Palliative Care consult placed. Precautions posted in room, chart.  SLP Visit Diagnosis: Dysphagia, oral phase (R13.11) (declined Cognitive engagement and follow through; baseline MS; bedbound status; frequent hospitalizations/illness per chart)    Aspiration Risk  Mild aspiration risk;Risk for inadequate nutrition/hydration (reduced following general precautions)    Diet Recommendation   Thin;Dysphagia 3 (mechanical soft) (for ease of cut/chopped meats and moistened foods) = a more mech soft consistency diet w/ well-Cut/Chopped meats, moistened foods; Thin liquids -- carefully monitor straw use, and pt should help to Hold Cup when drinking. Recommend general aspiration precautions, reduced distractions at meals. Small bites/sips Slowly.   Medication Administration: Crushed with puree (vs whole if able)    Other  Recommendations Recommended Consults:  (Palliative Care consult for GOC; Dietician f/u) Oral Care Recommendations: Oral care BID;Oral care before and after PO;Staff/trained caregiver to provide oral care     Assistance Recommended at Discharge  FULL at meals  Functional Status Assessment Patient has had a recent decline in their functional status and demonstrates the ability to make significant improvements in function in a reasonable and predictable amount of time.  Frequency and Duration  (n/a)   (n/a)       Prognosis Prognosis for improved oropharyngeal function: Fair Barriers to Reach Goals: Cognitive deficits;Language deficits;Time post onset;Severity of deficits Barriers/Prognosis Comment: declined Cognitive engagement and follow through; baseline MS; bedbound status; frequent  hospitalizations/illness per chart      Swallow Study   General Date of Onset: 05/02/24 HPI: Pt is a 53 y.o. female admitted w/ Acute metabolic encephalopathy and UTI (urinary tract infection)  with medical history significant of multiple sclerosis(MS) with chronic left-sided weakness, Paraplegia with bedbound status, neurogenic bladder status post ileal conduit chronic indwelling catheter, recurrent MDR UTIs, HTN, HFpEF, anxiety/depression, brought in by family member for worsening of mentation changes.     Sister at bedside reported that since yesterday patient has become more sleepy and when she woke up she has episodes of confusion as well.  This morning, family noticed that the patient is hard to arouse and decided to bring her to the hospital.  Unsure of pt's Baseline Cognitive functioning. MRI on 8/11: Grossly stable appearance of T2/FLAIR hyperintensity involving  the supratentorial cerebral white matter, consistent with history of  demyelinating disease. No abnormal enhancement to suggest active  demyelination on this motion degraded exam.  2. No other acute intracranial abnormality..  Repeat CXRs: Stable chronic bibasilar scarring. No acute cardiopulmonary process. No active disease..    Per chart review, appears patient was recently admitted 04/26/2024 for UTI and a syncopal episode.  Patient was also admitted 7/27-7/31/2025 acute metabolic encephalopathy in the setting of UTI.   OF NOTE: this is pt's 8th admit to the ED/hospital in ~5 months. Type of Study: Bedside Swallow Evaluation Previous Swallow Assessment: 11/2021 and 11/2023- both w/ recommendation for Regular diet, thins w/ general aspiration precautions. Diet Prior to this Study: Dysphagia 3 (mechanical soft);Thin liquids (Level 0) (currently ordered) Temperature Spikes Noted: No (wbc 13.2) Respiratory Status: Room air History of Recent Intubation: No Behavior/Cognition: Alert;Cooperative;Pleasant mood;Confused;Requires cueing;Distractible;Doesn't follow directions (not consistent) Oral Cavity Assessment: Within Functional Limits Oral Care Completed by SLP: Recent completion by staff Oral Cavity - Dentition: Adequate natural  dentition Vision: Functional for self-feeding (appeared when she held cup w/ SLP) Self-Feeding Abilities: Able to feed self;Needs assist;Needs set up;Total assist;Able to feed self with adaptive devices (has used built-up handle utensils per chart) Patient Positioning: Upright in bed (Full support) Baseline Vocal Quality: Normal;Low vocal intensity (min) Volitional Cough: Cognitively unable to elicit Volitional Swallow: Unable to elicit    Oral/Motor/Sensory Function Overall Oral Motor/Sensory Function: Within functional limits (no unilateral weakness during oral intake nor expectoration)   Ice Chips Ice chips: Not tested   Thin Liquid Thin Liquid: Within functional limits Presentation: Self Fed;Straw (held cup w/ pt; ~10 ozs total) Other Comments: water, juice(OJ x2), Ensure    Nectar Thick Nectar Thick Liquid: Not tested   Honey Thick Honey Thick Liquid: Not tested   Puree Puree: Within functional limits (and Impaired intermittently d/t Cognitive decline) Presentation: Spoon (fed; 10+ trials including w/ Crushed Meds by NSG) Other Comments: pt expectorated boluses 4x - Cognitive   Solid     Solid: Impaired (continued impact from Cognitive decline) Presentation: Spoon (fed; 5 trials) Oral Phase Impairments: Poor awareness of bolus Oral Phase Functional Implications:  (expectoration ob boluses x3/4) Pharyngeal Phase Impairments:  (none)        Comer Portugal, MS, CCC-SLP Speech Language Pathologist Rehab Services; [facility] [place] [phone] (ascom) Mayana Irigoyen 05/08/2024,3:55 PM

## 2024-05-09 DIAGNOSIS — R5381 Other malaise: Secondary | ICD-10-CM | POA: Diagnosis not present

## 2024-05-09 DIAGNOSIS — G9340 Encephalopathy, unspecified: Secondary | ICD-10-CM | POA: Diagnosis not present

## 2024-05-09 DIAGNOSIS — Z7189 Other specified counseling: Secondary | ICD-10-CM | POA: Diagnosis not present

## 2024-05-09 DIAGNOSIS — Z515 Encounter for palliative care: Secondary | ICD-10-CM | POA: Diagnosis not present

## 2024-05-09 DIAGNOSIS — G35 Multiple sclerosis: Secondary | ICD-10-CM | POA: Diagnosis not present

## 2024-05-09 LAB — BASIC METABOLIC PANEL WITH GFR
Anion gap: 15 (ref 5–15)
BUN: 16 mg/dL (ref 6–20)
CO2: 25 mmol/L (ref 22–32)
Calcium: 8.9 mg/dL (ref 8.9–10.3)
Chloride: 106 mmol/L (ref 98–111)
Creatinine, Ser: 1.14 mg/dL — ABNORMAL HIGH (ref 0.44–1.00)
GFR, Estimated: 57 mL/min — ABNORMAL LOW (ref >60)
Glucose, Bld: 89 mg/dL (ref 70–99)
Potassium: 3.9 mmol/L (ref 3.5–5.1)
Sodium: 146 mmol/L — ABNORMAL HIGH (ref 135–145)

## 2024-05-09 LAB — CBC
HCT: 37.6 % (ref 36.0–46.0)
Hemoglobin: 11.8 g/dL — ABNORMAL LOW (ref 12.0–15.0)
MCH: 28.8 pg (ref 26.0–34.0)
MCHC: 31.4 g/dL (ref 30.0–36.0)
MCV: 91.7 fL (ref 80.0–100.0)
Platelets: 433 K/uL — ABNORMAL HIGH (ref 150–400)
RBC: 4.1 MIL/uL (ref 3.87–5.11)
RDW: 15.9 % — ABNORMAL HIGH (ref 11.5–15.5)
WBC: 13.2 K/uL — ABNORMAL HIGH (ref 4.0–10.5)
nRBC: 0 % (ref 0.0–0.2)

## 2024-05-09 MED ORDER — ENSURE PLUS HIGH PROTEIN PO LIQD
237.0000 mL | Freq: Three times a day (TID) | ORAL | Status: DC
  Administered 2024-05-09 (×3): 237 mL via ORAL

## 2024-05-09 MED ORDER — IRBESARTAN 150 MG PO TABS
75.0000 mg | ORAL_TABLET | Freq: Every day | ORAL | Status: DC
  Administered 2024-05-09 – 2024-05-10 (×2): 75 mg via ORAL
  Filled 2024-05-09 (×2): qty 1

## 2024-05-09 NOTE — Progress Notes (Signed)
   05/09/24 1400  Spiritual Encounters  Type of Visit Initial  Care provided to: Pt and family  Referral source Other (comment) (Spiritual Consent)  Reason for visit Routine spiritual support  OnCall Visit No  Spiritual Framework  Presenting Themes Meaning/purpose/sources of inspiration  Interventions  Spiritual Care Interventions Made Established relationship of care and support;Compassionate presence;Prayer  Intervention Outcomes  Outcomes Connection to spiritual care   Chaplain provided compassionate presence to patient and family. Chaplain allowed space for patient just to share. Chaplain prayed with patient.

## 2024-05-09 NOTE — Progress Notes (Addendum)
 Palliative:  HPI: 53 y.o. female  with past medical history of multiple sclerosis with chronic left-sided weakness and neurogenic bladder s/p ileal conduit chronic indwelling catheter, recurrent MDR UTIs, HTN, HFpEF, anxiety/depression admitted on 05/02/2024 with worsening mentation. Some small improvements noted after rehydration and beginning antibiotics in the setting of chronic Enterococcus colonization.   I met today at [name]'s bedside and no family currently present. [name] is more alert and interactive today. More appropriate responses. She does accept some solid food and able to chew without spitting it out. She tells me I don't like sausage but was accepting of a few bites of pancake. She did drink some Ensure well.  Update: I returned to meet with family. They had multiple family members present ~25 people including mother, siblings, cousins, Education officer, environmental. [name] is very loved. We met outside room. I reviewed with family current acute illness and progression - she fortunately appears improved since yesterday. We review baseline and [name] is bed/wheelchair bound and unable to assist with transfers. Family report that she was able to feed herself. She was mostly continent and aware of BM but not always. Some family do report some mild cognitive changes over past months. We did review that we will know more about her needs after this acute illness over the next few days.   We spent time discussing expectations moving forward and high risk for further infection and complications. We discussed risks of recurrent UTI as well as pneumonia, skin breakdown with knowledge that the body does not tolerate decreased functional status well. We discussed further planning ahead for what may be coming in the future. We discussed in detail code status and the risks vs benefits. We discussed also feeding tube and the risks vs benefits. Family are understanding and accepting of conversation and acknowledge the importance  of having these discussions even though they are not pleasant to think about. One brother shares that he is hopeful that [name] can ultimately participate and have a say in these conversations. We discussed that we can monitor to see if there is a window to discuss while she is here but if not we will connect them with outpatient palliative. [name] and family are very spiritual and find peace through their faith - this will help guide their decisions. I did share that we do not have to make these decisions today but if these interventions are something they feel [name] would not desire we should put in place DNR sooner than later to ensure we do not find ourselves in a situation she would not want. I provided them with MOST form to review and guide conversation as well. Family was appreciative of conversation.   All questions/concerns addressed. Emotional support provided. Updated Dr. Lenon.   Exam: Alert and more interactive. Still with some confusion but knows family and greets me. Smiling and good spirits. No distress. Breathing regular, unlabored. Abd soft. Generalized weakness.   Plan: - Full code but ongoing family discussions - Continue treatment - Ongoing goals of care while inpatient - Refer to outpatient palliative care  90 min  Bernarda Kitty, NP Palliative Medicine Team Pager [phone] (Please see [email or web address] for schedule) Team Phone [phone]

## 2024-05-09 NOTE — Progress Notes (Signed)
 Pt refusing vitals and blood work

## 2024-05-09 NOTE — Plan of Care (Signed)
  Problem: Clinical Measurements: Goal: Ability to maintain clinical measurements within normal limits will improve Outcome: Progressing Goal: Will remain free from infection Outcome: Progressing Goal: Diagnostic test results will improve Outcome: Progressing Goal: Respiratory complications will improve Outcome: Progressing   Problem: Nutrition: Goal: Adequate nutrition will be maintained Outcome: Progressing   Problem: Pain Managment: Goal: General experience of comfort will improve and/or be controlled Outcome: Progressing   Problem: Safety: Goal: Ability to remain free from injury will improve Outcome: Progressing   Problem: Skin Integrity: Goal: Risk for impaired skin integrity will decrease Outcome: Progressing

## 2024-05-09 NOTE — Progress Notes (Signed)
 Progress Note Patient: [name] FMW:[id number] DOB: 1970-11-27 DOA: 05/02/2024     7 DOS: the patient was seen and examined on 05/09/2024   Brief hospital course: [name] is a 53 y.o. female with medical history significant of multiple sclerosis with chronic left-sided weakness, neurogenic bladder status post ileal conduit, chronic indwelling catheter, recurrent MDR UTIs, HTN, HFpEF, anxiety/depression, brought in by family member for worsening of mentation changes.   Assessment and Plan:  Acute metabolic encephalopathy. Progressive multiple sclerosis Paraplegia with bedbound status. Neurogenic bladder status post ileal conduit with chronic Enterococcus colonization patient at baseline is bedbound. EEG did not show any seizure activity. Empirically treating with unasyn  for UTI per guidance by ID as she has not had significant improvement in her mental status with supportive care, brain MRI negative 8/11 for acute findings or worsening of MS. She appears to have some mild cognitive improvement today per reports of her presentation. Will continue treatment.  - palliative consulted.   Poor PO intake- SLP was consulted. Mental status appears to be biggest hindrance to PO intake. Continue IV fluid supplement until PO intake improves.    Hypokalemia- mild. K+ 3.9 today. Replete and monitor AKI ruled out. Cr normalized    Tachycardia- sinus rhythm. Was started on metoprolol  by prior attending and HR has normalized in 60-80s.  - will continue metoprolol   Essential hypertension. Significantly elevated to 190s systolics and wildly inconsistent. Low pressures after starting irbesartan  yesterday. - start lower dose of irbesartan  and titrate up as needed.    Chronic diastolic congestive heart failure. Patient is volume depleted, no exacerbation.  Overweight BMI 27.29.  Constipation- Continue bowel regimen PRN     Subjective:  Patient reports no complaints. Talks repetitively  about seeing God.   Physical Exam: Vitals:   05/08/24 1826 05/08/24 1945 05/08/24 2346 05/09/24 0345  BP: 94/68 (!) 99/49 105/74   Pulse: 90 88 76   Resp: 18 18 20 18   Temp: 98 F (36.7 C) 98.6 F (37 C) 98.9 F (37.2 C)   TempSrc: Axillary Oral Axillary   SpO2: 100% 100% 100%   Weight:      Height:       General exam: Appears calm and comfortable  Respiratory system: Clear to auscultation. Respiratory effort normal. Central nervous system: Alert and oriented to herself, was not able to answer further questions appropriately.  Left-sided weakness. Skin: No rashes, lesions or ulcers Psychiatry:  repetitive speech and unable to appropriately answer questions. Is able to follow some simple commands  Data Reviewed:    Latest Ref Rng & Units 05/08/2024    4:33 AM 05/07/2024    7:01 AM 05/05/2024    3:45 AM  CBC  WBC 4.0 - 10.5 K/uL 13.2  12.5  11.6   Hemoglobin 12.0 - 15.0 g/dL 89.0  88.7  89.2   Hematocrit 36.0 - 46.0 % 34.4  35.8  32.7   Platelets 150 - 400 K/uL 491  539  475        Latest Ref Rng & Units 05/08/2024    4:33 AM 05/07/2024    7:01 AM 05/06/2024    5:19 AM  BMP  Glucose 70 - 99 mg/dL 84  98  892   BUN 6 - 20 mg/dL 13  11  12    Creatinine 0.44 - 1.00 mg/dL 9.05  9.06  8.95   Sodium 135 - 145 mmol/L 146  143  140   Potassium 3.5 - 5.1 mmol/L 3.3  3.9  3.9   Chloride 98 - 111 mmol/L 110  105  108   CO2 22 - 32 mmol/L 24  24  23    Calcium 8.9 - 10.3 mg/dL 8.7  8.9  8.7     Family Communication: none at bedside  Disposition: Status is: Inpatient Remains inpatient appropriate because: Severity of disease, IV treatment, altered mental status.     Time spent: 50 minutes  Author: Marien LITTIE Piety, MD 05/09/2024 7:21 AM  For on call review [email or web address].

## 2024-05-09 NOTE — TOC Progression Note (Signed)
 Transition of Care [facility]) - Progression Note    Patient Details  Name: [name] MRN: [id number] Date of Birth: 23-Mar-1971  [facility] CM/SW Contact  Dalia GORMAN Fuse, RN Phone Number: 05/09/2024, 8:52 AM  Clinical Narrative:     Patient remains inpatient appropriate. She has poor po intake, SLP consulted. Plan for home with caregivers when medically clear.                     Expected Discharge Plan and Services                                               Social Drivers of Health (SDOH) Interventions SDOH Screenings   Food Insecurity: Patient Unable To Answer (05/02/2024)  Housing: Patient Unable To Answer (05/02/2024)  Transportation Needs: Patient Unable To Answer (05/02/2024)  Utilities: Patient Unable To Answer (05/02/2024)  Financial Resource Strain: Low Risk  (01/24/2024)   Received from [facility]  Physical Activity: Inactive (11/03/2022)   Received from [facility]  Social Connections: Moderately Isolated (11/03/2022)   Received from [facility]  Stress: No Stress Concern Present (11/03/2022)   Received from [facility]  Tobacco Use: Low Risk  (05/02/2024)  Health Literacy: Low Risk  (04/05/2024)   Received from [facility]    Readmission Risk Interventions     No data to display

## 2024-05-09 NOTE — Plan of Care (Signed)
  Problem: Clinical Measurements: Goal: Ability to maintain clinical measurements within normal limits will improve Outcome: Progressing Goal: Will remain free from infection Outcome: Progressing Goal: Diagnostic test results will improve Outcome: Progressing Goal: Respiratory complications will improve Outcome: Progressing Goal: Cardiovascular complication will be avoided Outcome: Progressing   Problem: Coping: Goal: Level of anxiety will decrease Outcome: Progressing   Problem: Elimination: Goal: Will not experience complications related to bowel motility Outcome: Progressing Goal: Will not experience complications related to urinary retention Outcome: Progressing   Problem: Pain Managment: Goal: General experience of comfort will improve and/or be controlled Outcome: Progressing   Problem: Safety: Goal: Ability to remain free from injury will improve Outcome: Progressing   Problem: Skin Integrity: Goal: Risk for impaired skin integrity will decrease Outcome: Progressing   Problem: Education: Goal: Knowledge of General Education information will improve Description: Including pain rating scale, medication(s)/side effects and non-pharmacologic comfort measures Outcome: Not Progressing   Problem: Health Behavior/Discharge Planning: Goal: Ability to manage health-related needs will improve Outcome: Not Progressing   Problem: Activity: Goal: Risk for activity intolerance will decrease Outcome: Not Progressing   Problem: Nutrition: Goal: Adequate nutrition will be maintained Outcome: Not Progressing

## 2024-05-10 DIAGNOSIS — T83510A Infection and inflammatory reaction due to cystostomy catheter, initial encounter: Secondary | ICD-10-CM

## 2024-05-10 DIAGNOSIS — N390 Urinary tract infection, site not specified: Secondary | ICD-10-CM | POA: Diagnosis not present

## 2024-05-10 DIAGNOSIS — Z7189 Other specified counseling: Secondary | ICD-10-CM | POA: Diagnosis not present

## 2024-05-10 DIAGNOSIS — G35 Multiple sclerosis: Secondary | ICD-10-CM | POA: Diagnosis not present

## 2024-05-10 DIAGNOSIS — R4182 Altered mental status, unspecified: Secondary | ICD-10-CM | POA: Diagnosis not present

## 2024-05-10 DIAGNOSIS — Z515 Encounter for palliative care: Secondary | ICD-10-CM | POA: Diagnosis not present

## 2024-05-10 MED ORDER — DULOXETINE HCL 30 MG PO CPEP: 30.0000 mg | ORAL_CAPSULE | Freq: Every day | ORAL | 0 refills | Status: AC

## 2024-05-10 MED ORDER — METOPROLOL TARTRATE 25 MG PO TABS: 12.5000 mg | ORAL_TABLET | Freq: Two times a day (BID) | ORAL | 0 refills | Status: AC

## 2024-05-10 MED ORDER — IRBESARTAN 75 MG PO TABS: 75.0000 mg | ORAL_TABLET | Freq: Every day | ORAL | 0 refills | Status: AC

## 2024-05-10 NOTE — Plan of Care (Signed)

## 2024-05-10 NOTE — Progress Notes (Signed)
 Palliative:  HPI: 53 y.o. female  with past medical history of multiple sclerosis with chronic left-sided weakness and neurogenic bladder s/p ileal conduit chronic indwelling catheter, recurrent MDR UTIs, HTN, HFpEF, anxiety/depression admitted on 05/02/2024 with worsening mentation. Some small improvements noted after rehydration and beginning antibiotics in the setting of chronic Enterococcus colonization.   I met today at [name]'s bedside along with sister, Dorothe. [name] is awake and more herself today. She is appropriate in conversation. She is tolerating po intake although not eating a lot. She drank entire Ensure after lunch when brought to her. She is in good spirits.   Dorothe and I spoke. Dorothe shares plans for discharge today and confusion. She reports that family are all anxious and want reassurance that [name]'s infection has been fully treated. I shared that [name] has had full antibiotic treatment as recommended by evidence based guidelines for her particular infection/bacteria. I explained the limited benefits of repeat urine sample given antibiotic treatment already received. We also discussed the significant improvement in [name]'s mentation and overall condition is further sign of adequate treatment. I acknowledged family hesitancy with frequent hospitalizations and recurrent admissions just 1 day to a few days after d/c. Family understand that she will likely continue to get UTI but want to ensure that she is being treated appropriately when she does have infection. After time discussing they agree with proceeding with d/c today.   I spoke further with Dorothe as she left room. She shares that family is very aware of what is coming and they have seen [name]'s decline. It is very difficult for them to picture a time when [name] is not here with them. Dorothe knows she is not ready for this. We acknowledged that this is not something we can ever truly be ready for but all we can do is make sure  that we are keeping [name] as happy as possible and supporting her through what is next. We also discussed continuing conversations to ensure we have answers and plans for the situations we discussed yesterday. Dorothe expresses how difficult this all is on her family. Emotional support provided.   All questions/concerns addressed to the best of my ability. Emotional support provided.   Exam: Awake, alert, oriented. No distress. Breathing regular, unlabored. Abd soft. Generalized weakness. Good spirits.   Plan: - Home today - Referral to outpatient palliative care to continue GOC conversation and advance care planning  60 min  Bernarda Kitty, NP Palliative Medicine Team Pager [phone] (Please see [email or web address] for schedule) Team Phone [phone]

## 2024-05-10 NOTE — Discharge Instructions (Signed)
 Please follow up with your primary doctor in 1-2 weeks to recheck your blood pressure on your new blood pressure medications. They may adjust your dose if needed and can send in your refills

## 2024-05-10 NOTE — TOC Transition Note (Signed)
 Transition of Care [facility]) - Discharge Note   Patient Details  Name: [name] MRN: [id number] Date of Birth: [birth year]  [facility] CM/SW Contact:  Alfonso Rummer, LCSW Phone Number: 05/10/2024, 2:38 PM   Clinical Narrative:   Pt is medically ready for discharge to home with mother and caregivers. [facility] will transport. The family is in agreement with discharge plan no other TOC needs identified.     Final next level of care: Home/Self Care Barriers to Discharge: Barriers Resolved   Patient Goals and CMS Choice Patient states their goals for this hospitalization and ongoing recovery are:: Going home          Discharge Placement               Pt is discharged home with family and caregivers.         Discharge Plan and Services Additional resources added to the After Visit Summary for                                       Social Drivers of Health (SDOH) Interventions SDOH Screenings   Food Insecurity: Patient Unable To Answer (05/02/2024)  Housing: Patient Unable To Answer (05/02/2024)  Transportation Needs: Patient Unable To Answer (05/02/2024)  Utilities: Patient Unable To Answer (05/02/2024)  Financial Resource Strain: Low Risk  (01/24/2024)   Received from [facility]  Physical Activity: Inactive (11/03/2022)   Received from [facility]  Social Connections: Moderately Isolated (11/03/2022)   Received from [facility]  Stress: No Stress Concern Present (11/03/2022)   Received from [facility]  Tobacco Use: Low Risk  (05/02/2024)  Health Literacy: Low Risk  (04/05/2024)   Received from [facility]     Readmission Risk Interventions     No data to display

## 2024-05-10 NOTE — Progress Notes (Signed)
 [facility] Outpatient Palliative Referral Note  Received request from Bernarda Kitty NP with [facility] PMT team for outpatient palliative consult after discharge from [facility].  Patient with plans to discharge today via [facility].  Patient awaits transport back home.  Notified Alfonso Dowse, TOC of request.  Thank you for allowing participation in this patient's care.  Saddie HILARIO Na, RN Nurse Liaison [phone]

## 2024-05-10 NOTE — Discharge Summary (Signed)
 Physician Discharge Summary  Patient: [name] FMW:[id number] DOB: June 28, 1971   Code Status: Full Code Admit date: 05/02/2024 Discharge date: 05/10/2024 Disposition: Home, No home health services recommended PCP: Eino Tinnie BIRCH, MD  Recommendations for Outpatient Follow-up:  Follow up with PCP within 1-2 weeks Regarding general hospital follow up and preventative care Recommend BMP, CBC Follow up with BP, HR. Started on metoprolol  and irbesartan  during admission and may need to be titrated up.   Discharge Diagnoses:  Principal Problem:   Encephalopathy Active Problems:   Acute metabolic encephalopathy   Multiple sclerosis, primary progressive (HCC)   Paraplegia (HCC)   Chronic diastolic CHF (congestive heart failure) (HCC)   Primary hypertension   UTI (urinary tract infection)   Hypokalemia  Brief Hospital Course Summary: [name] is a 53 y.o. female with medical history significant of multiple sclerosis with chronic left-sided weakness, neurogenic bladder status post ileal conduit, chronic indwelling catheter, recurrent MDR UTIs, HTN, HFpEF, anxiety/depression, brought in by family member for worsening of mentation changes.   Acute metabolic encephalopathy. Progressive multiple sclerosis Paraplegia with bedbound status. Neurogenic bladder status post ileal conduit with chronic Enterococcus colonization patient at baseline is bedbound. EEG did not show any seizure activity. Empirically treating with unasyn  for UTI per guidance by ID as she has not had significant improvement in her mental status with supportive care, brain MRI negative 8/11 for acute findings or worsening of MS. She appears to have some mild cognitive improvement today per reports of her presentation. Will continue treatment.  - palliative consulted.    Poor PO intake- SLP was consulted. Mental status appears to be biggest hindrance to PO intake. Continue IV fluid supplement until PO intake  improves.    Hypokalemia- mild. K+ 3.9 today. Replete and monitor AKI ruled out. Cr normalized    Tachycardia- sinus rhythm. Was started on metoprolol  by prior attending and HR has normalized in 60-80s.  - will continue metoprolol    Essential hypertension. Significantly elevated to 190s systolics and wildly inconsistent. Low pressures after starting irbesartan  yesterday. - start lower dose of irbesartan  and titrate up as needed.    Chronic diastolic congestive heart failure. Patient is volume depleted, no exacerbation.  All other chronic conditions were treated with home medications.    Discharge Condition: {DISCHARGE CONDITION:19696}, improved Recommended discharge diet: {Discharge Ipzu:[id number]}  Consultations: ***  Procedures/Studies: ***   Allergies as of 05/10/2024       Reactions   Latex Swelling, Other (See Comments)   Reaction:  Facial swelling    Vancomycin  Itching        Medication List     STOP taking these medications    baclofen  20 MG tablet Commonly known as: LIORESAL        TAKE these medications    acetaminophen  500 MG tablet Commonly known as: TYLENOL  Take 500 mg by mouth every 6 (six) hours as needed.   amantadine  100 MG capsule Commonly known as: SYMMETREL  Take 200 mg by mouth 2 (two) times daily.   Aquacel Ag Foam 6X6 Pads Apply 1 each topically daily in the afternoon.   Dulcolax 5 MG EC tablet Generic drug: bisacodyl  Take 1 tablet (5 mg total) by mouth daily as needed for moderate constipation.   DULoxetine  30 MG capsule Commonly known as: CYMBALTA  Take 1 capsule (30 mg total) by mouth daily. Start taking on: May 11, 2024 What changed:  medication strength how much to take   Foam Dressing Circular Border Pads Apply 1  each topically every 3 (three) days.   gabapentin  100 MG capsule Commonly known as: NEURONTIN  Take by mouth in the morning, at noon, and at bedtime. Take 200 mg by mouth in the morning (8 am), 400 mg at  12 pm (noon), and 400 mg at 4 pm.   gabapentin  800 MG tablet Commonly known as: NEURONTIN  Take 800 mg by mouth at bedtime.   irbesartan  75 MG tablet Commonly known as: AVAPRO  Take 1 tablet (75 mg total) by mouth daily. Start taking on: May 11, 2024   lactulose  10 GM/15ML solution Commonly known as: CHRONULAC  Take 10 g by mouth daily.   metoprolol  tartrate 25 MG tablet Commonly known as: LOPRESSOR  Take 0.5 tablets (12.5 mg total) by mouth 2 (two) times daily.   ondansetron  4 MG disintegrating tablet Commonly known as: ZOFRAN -ODT Take 1 tablet (4 mg total) by mouth every 8 (eight) hours as needed.   pantoprazole  40 MG tablet Commonly known as: PROTONIX  Take 1 tablet by mouth daily.   senna 8.6 MG tablet Commonly known as: SENOKOT Take 2 tablets by mouth 2 (two) times daily.        Follow-up Information     Eino Tinnie BIRCH, MD Follow up.   Specialty: Internal Medicine Why: Hospital follow up Contact information: [address] [place] [address] [phone]                 Subjective   Pt reports ***  All questions and concerns were addressed at time of discharge.  Objective  Blood pressure (!) 161/77, pulse 83, temperature 98.5 F (36.9 C), resp. rate 16, height 5' 4 (1.626 m), weight 72.1 kg, SpO2 100%.   General: Pt is alert, awake, not in acute distress Cardiovascular: RRR, S1/S2 +, no rubs, no gallops Respiratory: CTA bilaterally, no wheezing, no rhonchi Abdominal: Soft, NT, ND, bowel sounds + Extremities: no edema, no cyanosis  The results of significant diagnostics from this hospitalization (including imaging, microbiology, ancillary and laboratory) are listed below for reference.   Imaging studies: DG Chest Port 1 View Result Date: 05/07/2024 CLINICAL DATA:  Shortness of breath. EXAM: PORTABLE CHEST 1 VIEW COMPARISON:  Radiographs 05/02/2024 and 04/26/2024. Chest CTA 12/03/2021. FINDINGS: 1320 hours. The heart size and  mediastinal contours are stable. Stable low lung volumes with stable chronic linear scarring at both lung bases. There is no confluent airspace disease, pleural effusion or pneumothorax. The bones appear unremarkable. IMPRESSION: Stable chronic bibasilar scarring. No acute cardiopulmonary process. Electronically Signed   By: Elsie Perone M.D.   On: 05/07/2024 14:25   MR BRAIN W WO CONTRAST Result Date: 05/06/2024 CLINICAL DATA:  Initial evaluation for multiple sclerosis. EXAM: MRI HEAD WITHOUT AND WITH CONTRAST TECHNIQUE: Multiplanar, multiecho pulse sequences of the brain and surrounding structures were obtained without and with intravenous contrast. CONTRAST:  7mL GADAVIST  GADOBUTROL  1 MMOL/ML IV SOLN COMPARISON:  Comparison made with CT from 05/02/2024 and MRI from 04/21/2024 FINDINGS: Brain: Examination severely degraded by motion artifact. Cerebral volume within normal limits. Previously identified predominant periventricular T2/FLAIR hyperintensity involving the cerebral white matter, grossly stable on this motion degraded exam. Subtle involvement of the brainstem and cerebellum. Overall, appearance is grossly stable from prior, and consistent with history of demyelinating disease. No abnormal enhancement to suggest active demyelination on this motion degraded exam. No evidence for acute or subacute infarct. No areas of chronic cortical infarction. No acute or chronic intracranial blood products. Previously identified 6 mm left frontal convexity meningioma again noted, grossly  stable. No other mass lesion, midline shift or mass effect. No hydrocephalus or extra-axial fluid collection. Pituitary gland suprasellar region within normal limits. No other abnormal enhancement. Vascular: Major intracranial vascular flow voids are grossly maintained at the skull base. Skull and upper cervical spine: Cranial junction within normal limits. Bone marrow signal intensity grossly normal. No scalp soft tissue  abnormality. Sinuses/Orbits: Globes orbital soft tissues demonstrate no acute finding. Paranasal sinuses are largely clear. No significant mastoid effusion. Other: None. IMPRESSION: 1. Motion degraded exam. 2. Grossly stable appearance of T2/FLAIR hyperintensity involving the supratentorial cerebral white matter, consistent with history of demyelinating disease. No abnormal enhancement to suggest active demyelination on this motion degraded exam. 3. No other acute intracranial abnormality. Electronically Signed   By: Morene Hoard M.D.   On: 05/06/2024 18:39   EEG adult Result Date: 05/02/2024 Shelton Arlin KIDD, MD     05/02/2024  5:31 PM Patient Name: SUZANN LAZARO MRN: [id number] Epilepsy Attending: Arlin KIDD Shelton Referring Physician/Provider: Laurita Cort DASEN, MD Date: 05/02/2024 Duration: 32.37 mins Patient history: 53yo f with ams. EEG to evaluate for seizure Level of alertness: Awake AEDs during EEG study: None Technical aspects: This EEG study was done with scalp electrodes positioned according to the 10-20 International system of electrode placement. Electrical activity was reviewed with band pass filter of 1-70Hz , sensitivity of 7 uV/mm, display speed of 69mm/sec with a 60Hz  notched filter applied as appropriate. EEG data were recorded continuously and digitally stored.  Video monitoring was available and reviewed as appropriate. Description: EEG showed continuous generalized 3 to 6 Hz theta-delta slowing. Hyperventilation and photic stimulation were not performed.   Of note, study was technically difficult due to significant myogenic artifact. ABNORMALITY - Continuous slow, generalized IMPRESSION: This technically difficult study is suggestive of moderate diffuse encephalopathy. No seizures or epileptiform discharges were seen throughout the recording. Priyanka O Yadav   CT ANGIO HEAD NECK W WO CM Result Date: 05/02/2024 CLINICAL DATA:  53 year old female is unresponsive. Last known well 2230  hours. Known multiple sclerosis. EXAM: CT ANGIOGRAPHY HEAD AND NECK WITH AND WITHOUT CONTRAST TECHNIQUE: Multidetector CT imaging of the head and neck was performed using the standard protocol during bolus administration of intravenous contrast. Multiplanar CT image reconstructions and MIPs were obtained to evaluate the vascular anatomy. Carotid stenosis measurements (when applicable) are obtained utilizing NASCET criteria, using the distal internal carotid diameter as the denominator. RADIATION DOSE REDUCTION: This exam was performed according to the departmental dose-optimization program which includes automated exposure control, adjustment of the mA and/or kV according to patient size and/or use of iterative reconstruction technique. CONTRAST:  75mL OMNIPAQUE  IOHEXOL  350 MG/ML SOLN COMPARISON:  Brain and cervical spine MRI, head CT 04/21/2024, and earlier. FINDINGS: CT HEAD Brain: Stable cerebral volume. No midline shift, ventriculomegaly, mass effect, evidence of mass lesion, intracranial hemorrhage or evidence of cortically based acute infarction. Gray-white matter differentiation is within normal limits throughout the brain. Calvarium and skull base: Intact, negative. Paranasal sinuses: Visualized paranasal sinuses and mastoids are stable and well aerated. Orbits: Visualized orbits and scalp soft tissues are within normal limits. CTA NECK Skeleton: Negative. Upper chest: Platelike atelectasis partially visible in the superior segment of the right lower lobe, otherwise visible bilateral lungs, central airways are patent. Negative visible superior mediastinum. Other neck: Small volume intravenous gas right IJ is likely IV access related. Nonvascular neck soft tissue spaces appear negative. Aortic arch: Normal 3 vessel arch. Right carotid system: Patent and normal. Left carotid system: Patent  and normal. Vertebral arteries: Proximal right subclavian artery and right vertebral artery origin are normal. Right  vertebral artery appears patent and within normal limits to the skull base. Proximal left subclavian artery and left vertebral artery origin are normal. Non dominant left vertebral artery, concordant with cervical spine MRI flow voids on 01/11/2023. Left vertebral artery remains diminutive but patent to the skull base. CTA HEAD Posterior circulation: Non dominant left vertebral artery functionally terminates in PICA. Dominant right V4 segment primarily supplies the basilar. Normal right PICA origin. Diminutive basilar artery is patent without stenosis. Patent SCA and PCA origins with both posterior communicating arteries present. Bilateral PCA branches are within normal limits. Anterior circulation: Patent although diminutive bilateral ICA siphons, similar to brain MRI flow voids 01/11/2023. And also previous exam 04/22/2010. No discrete ICA siphon plaque or stenosis. Normal posterior communicating artery origins. Patent carotid termini, MCA and ACA origins. Diminutive or absent anterior communicating artery. Bilateral ACA branches are within normal limits. MCA M1 segments and bifurcations appear patent without stenosis. Bilateral MCA branches are within normal limits. Venous sinuses: Early contrast timing, superior sagittal sinus appears patent. Anatomic variants: Dominant right vertebral artery which primarily supplies the basilar. Review of the MIP images confirms the above findings IMPRESSION: 1. CTA Head and Neck is negative for atherosclerosis or large vessel occlusion. Chronically diminutive appearance of the intracranial circulation. 2. Stable and negative CT appearance of the brain. 3. Atelectasis partially visible in the right lower lobe superior segment Electronically Signed   By: VEAR Hurst M.D.   On: 05/02/2024 14:48   DG Chest Portable 1 View Result Date: 05/02/2024 CLINICAL DATA:  Found unresponsive EXAM: PORTABLE CHEST 1 VIEW COMPARISON:  04/26/2024 FINDINGS: Cardiac shadow is stable. Lungs are well  aerated bilaterally. Minimal right basilar atelectasis/scarring is again seen and stable. No new focal abnormality is noted. IMPRESSION: No active disease. Electronically Signed   By: Oneil Devonshire M.D.   On: 05/02/2024 12:09   ECHOCARDIOGRAM COMPLETE Result Date: 04/27/2024    ECHOCARDIOGRAM REPORT   Patient Name:   FRANCILE WOOLFORD Date of Exam: 04/27/2024 Medical Rec #:  [id number]        Height:       64.0 in Accession #:    [id number]       Weight:       176.1 lb Date of Birth:  07-03-1971         BSA:          1.853 m Patient Age:    53 years         BP:           115/104 mmHg Patient Gender: F                HR:           96 bpm. Exam Location:  [facility] Procedure: 2D Echo, Cardiac Doppler and Color Doppler (Both Spectral and Color            Flow Doppler were utilized during procedure). Indications:     Syncope R55  History:         Patient has prior history of Echocardiogram examinations, most                  recent 09/03/2021.  Sonographer:     Thedora Louder RDCS, FASE Referring Phys:  [id number] CAMILA A THOMAS Diagnosing Phys: Redell Cave MD IMPRESSIONS  1. Left ventricular ejection fraction, by estimation, is 60 to 65%. The left  ventricle has normal function. The left ventricle has no regional wall motion abnormalities. There is mild left ventricular hypertrophy. Left ventricular diastolic parameters are consistent with Grade I diastolic dysfunction (impaired relaxation).  2. Right ventricular systolic function is normal. The right ventricular size is not well visualized.  3. The mitral valve is normal in structure. No evidence of mitral valve regurgitation.  4. Tricuspid valve regurgitation is mild to moderate.  5. The aortic valve is tricuspid. Aortic valve regurgitation is not visualized. FINDINGS  Left Ventricle: Left ventricular ejection fraction, by estimation, is 60 to 65%. The left ventricle has normal function. The left ventricle has no regional wall motion abnormalities. The left ventricular  internal cavity size was normal in size. There is  mild left ventricular hypertrophy. Left ventricular diastolic parameters are consistent with Grade I diastolic dysfunction (impaired relaxation). Right Ventricle: The right ventricular size is not well visualized. No increase in right ventricular wall thickness. Right ventricular systolic function is normal. Left Atrium: Left atrial size was normal in size. Right Atrium: Right atrial size was normal in size. Pericardium: There is no evidence of pericardial effusion. Mitral Valve: The mitral valve is normal in structure. No evidence of mitral valve regurgitation. Tricuspid Valve: The tricuspid valve is normal in structure. Tricuspid valve regurgitation is mild to moderate. Aortic Valve: The aortic valve is tricuspid. Aortic valve regurgitation is not visualized. Aortic valve peak gradient measures 12.0 mmHg. Pulmonic Valve: The pulmonic valve was grossly normal. Pulmonic valve regurgitation is not visualized. Aorta: The aortic root is normal in size and structure. Venous: The inferior vena cava was not well visualized. IAS/Shunts: No atrial level shunt detected by color flow Doppler.  LEFT VENTRICLE PLAX 2D LVIDd:         2.90 cm   Diastology LVIDs:         2.00 cm   LV e' medial:    10.20 cm/s LV PW:         1.10 cm   LV E/e' medial:  9.0 LV IVS:        1.40 cm   LV e' lateral:   8.16 cm/s LVOT diam:     1.90 cm   LV E/e' lateral: 11.3 LV SV:         67 LV SV Index:   36 LVOT Area:     2.84 cm  RIGHT VENTRICLE RV Basal diam:  2.70 cm RV S prime:     11.10 cm/s LEFT ATRIUM             Index        RIGHT ATRIUM          Index LA diam:        3.00 cm 1.62 cm/m   RA Area:     5.33 cm LA Vol (A2C):   17.2 ml 9.28 ml/m   RA Volume:   8.63 ml  4.66 ml/m LA Vol (A4C):   30.2 ml 16.29 ml/m LA Biplane Vol: 23.5 ml 12.68 ml/m  AORTIC VALVE                 PULMONIC VALVE AV Area (Vmax): 2.28 cm     PV Vmax:        1.03 m/s AV Vmax:        173.00 cm/s  PV Peak grad:    4.2 mmHg AV Peak Grad:   12.0 mmHg    RVOT Peak grad: 3 mmHg LVOT Vmax:  139.00 cm/s LVOT Vmean:     90.000 cm/s LVOT VTI:       0.236 m  AORTA Ao Root diam: 2.70 cm MITRAL VALVE                TRICUSPID VALVE MV Area (PHT): 5.27 cm     TR Peak grad:   34.8 mmHg MV Decel Time: 144 msec     TR Vmax:        295.00 cm/s MV E velocity: 92.20 cm/s MV A velocity: 123.00 cm/s  SHUNTS MV E/A ratio:  0.75         Systemic VTI:  0.24 m                             Systemic Diam: 1.90 cm Redell Cave MD Electronically signed by Redell Cave MD Signature Date/Time: 04/27/2024/3:52:59 PM    Final    DG Chest 1 View Result Date: 04/26/2024 CLINICAL DATA:  Weakness. EXAM: CHEST  1 VIEW COMPARISON:  04/21/2024. FINDINGS: Low lung volume. Linear area of atelectasis/scarring noted overlying the right lower lung zone, similar to the prior study. Bilateral lung fields are otherwise clear. No acute consolidation or lung collapse. Bilateral costophrenic angles are clear. Normal cardio-mediastinal silhouette. No acute osseous abnormalities. The soft tissues are within normal limits. IMPRESSION: No active disease. Electronically Signed   By: Ree Molt M.D.   On: 04/26/2024 10:47   CT ABDOMEN PELVIS WO CONTRAST Result Date: 04/22/2024 CLINICAL DATA:  Confusion and fever EXAM: CT ABDOMEN AND PELVIS WITHOUT CONTRAST TECHNIQUE: Multidetector CT imaging of the abdomen and pelvis was performed following the standard protocol without IV contrast. RADIATION DOSE REDUCTION: This exam was performed according to the departmental dose-optimization program which includes automated exposure control, adjustment of the mA and/or kV according to patient size and/or use of iterative reconstruction technique. COMPARISON:  CT abdomen and pelvis dated 03/18/2024 FINDINGS: Lower chest: Bilateral lower lobe subsegmental atelectasis. No pleural effusion or pneumothorax demonstrated. Partially imaged heart size is normal. Hepatobiliary: No  focal hepatic lesions. No intra or extrahepatic biliary ductal dilation. Cholelithiasis. Pancreas: No focal lesions or main ductal dilation. Spleen: Normal in size without focal abnormality. Adrenals/Urinary Tract: No adrenal nodules. Duplex left kidney. No suspicious renal masses by noncontrast technique. Multifocal bilateral renal cortical scarring. No hydronephrosis or calculi. Right lower quadrant ileal conduit. 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 Stomach/Bowel: Normal appearance of the stomach. No evidence of bowel wall thickening, distention, or inflammatory changes. Dilated rectum contains large volume stool. Large volume stool throughout the remainder of the colon. Vascular/Lymphatic: Aortic atherosclerosis. 13 mm left para-aortic lymph node (2:30), previously 8 mm. Reproductive: No adnexal masses. Other: No free fluid, fluid collection, or free air. Musculoskeletal: No acute or abnormal lytic or blastic osseous lesions. Postsurgical changes of L5-S1 fusion. Hardware appears intact. IMPRESSION: 1. Dilated rectum contains large volume stool, which may reflect fecal impaction. Large volume stool throughout the remainder of the colon. 2. Right lower quadrant ileal conduit with similar parastomal hernia containing loops of nondilated small bowel. 3. Cholelithiasis. 4. Interval increase in size of a 13 mm left para-aortic lymph node, previously 8 mm, likely reactive. 5.  Aortic Atherosclerosis (ICD10-I70.0). Electronically Signed   By: Limin  Xu M.D.   On: 04/22/2024 14:56   EEG adult Result Date: 04/22/2024 Gregg Lek, MD     04/22/2024  2:06 PM Patient Name: CZARINA GINGRAS MRN: [id number] Epilepsy  Attending: Pastor Falling Referring Physician/Provider: Date: 04/22/2024 Duration: 30 minutes Patient history: 53 year old with altered mental status. EEG to evaluate for seizure Level of alertness:  lethargic AEDs during EEG study: Gabapentin  Technical aspects: This EEG study  was done with scalp electrodes positioned according to the 10-20 International system of electrode placement. Electrical activity was reviewed with band pass filter of 1-70Hz , sensitivity of 7 uV/mm, display speed of 25mm/sec with a 60Hz  notched filter applied as appropriate. EEG data were recorded continuously and digitally stored.  Video monitoring was available and reviewed as appropriate. Description: EEG showed continuous generalized slowing with low voltage and increase myogenic artifact.  Hyperventilation and photic stimulation were not performed.   ABNORMALITY - Continuous slow, generalized IMPRESSION: This study is suggestive of mild diffuse encephalopathy, nonspecific etiology but likely related to sedation, toxic-metabolic etiology. No seizures or epileptiform discharges were seen throughout the recording. Pastor Falling MD Neurology    MR BRAIN W WO CONTRAST Addendum Date: 04/21/2024 ADDENDUM REPORT: 04/21/2024 22:10 ADDENDUM: On further review, approximately 6 mm left frontal convexity meningioma is unchanged. No mass effect. Electronically Signed   By: Gilmore GORMAN Molt M.D.   On: 04/21/2024 22:10   Result Date: 04/21/2024 CLINICAL DATA:  Demyelinating disease new right upper extremity weakness EXAM: MRI HEAD WITHOUT AND WITH CONTRAST TECHNIQUE: Multiplanar, multiecho pulse sequences of the brain and surrounding structures were obtained without and with intravenous contrast. CONTRAST:  9mL GADAVIST  GADOBUTROL  1 MMOL/ML IV SOLN COMPARISON:  MRI head December 08, 2023. FINDINGS: Brain: Stable predominately periventricular T2/FLAIR hyperintensities the white matter. Similar subtle foci in the brainstem. No new white matter lesions or an abnormal enhancement. No evidence of acute infarct, acute hemorrhage, mass lesion or midline shift. No hydrocephalus. Vascular: Major arterial flow voids are maintained. Skull and upper cervical spine: Normal marrow signal. Sinuses/Orbits: Negative. IMPRESSION: Stable  white matter disease, compatible with reported multiple sclerosis. No new lesions or abnormal enhancement. Electronically Signed: By: Gilmore GORMAN Molt M.D. On: 04/21/2024 22:01   MR CERVICAL SPINE W WO CONTRAST Result Date: 04/21/2024 CLINICAL DATA:  Demyelinating disease new RUE weakness EXAM: MRI CERVICAL SPINE WITHOUT AND WITH CONTRAST TECHNIQUE: Multiplanar and multiecho pulse sequences of the cervical spine, to include the craniocervical junction and cervicothoracic junction, were obtained without and with intravenous contrast. CONTRAST:  9mL GADAVIST  GADOBUTROL  1 MMOL/ML IV SOLN COMPARISON:  MRI cervical spine April 17, 24. FINDINGS: Alignment: No substantial sagittal subluxation. Vertebrae: No fracture, evidence of discitis, or bone lesion. Cord: No convincing cord signal abnormality. No abnormal enhancement. Posterior Fossa, vertebral arteries, paraspinal tissues: Negative. Disc levels: C2-C3: Posterior disc osteophyte complex without significant stenosis. C3-C4: Posterior disc osteophyte complex and bilateral facet and uncovertebral hypertrophy. Similar mild right and moderate left foraminal stenosis. Mild canal stenosis. C4-C5: Posterior disc osteophyte complex with bilateral facet uncovertebral hypertrophy. Mild bilateral foraminal stenosis. No significant canal stenosis. C5-C6: Facet arthropathy without significant stenosis. Small disc bulge. C6-C7: Small posterior disc osteophyte complex without significant stenosis. C7-T1: No significant disc protrusion, foraminal stenosis, or canal stenosis. IMPRESSION: 1. No convincing cord signal abnormality or abnormal enhancement. 2. Similar multilevel degenerative change, greatest at C3-C4 where there is moderate left and mild right foraminal stenosis and mild canal stenosis. Electronically Signed   By: Gilmore GORMAN Molt M.D.   On: 04/21/2024 22:09   CT HEAD CODE STROKE WO CONTRAST` Result Date: 04/21/2024 CLINICAL DATA:  Code stroke. Neuro deficit, acute,  stroke suspected. Confusion and fever. History of multiple sclerosis. EXAM: CT HEAD WITHOUT CONTRAST  TECHNIQUE: Contiguous axial images were obtained from the base of the skull through the vertex without intravenous contrast. RADIATION DOSE REDUCTION: This exam was performed according to the departmental dose-optimization program which includes automated exposure control, adjustment of the mA and/or kV according to patient size and/or use of iterative reconstruction technique. COMPARISON:  Head CT and MRI 12/08/2023 FINDINGS: Brain: There is no evidence of an acute infarct, intracranial hemorrhage, mass, midline shift, or extra-axial fluid collection. Cerebral volume is normal. The ventricles are normal in size. Known chronic demyelinating disease involving the cerebral white matter on MRI is not was shown by CT. Vascular: No hyperdense vessel. Skull: No fracture or suspicious lesion. Sinuses/Orbits: Visualized paranasal sinuses and mastoid air cells are clear. Unremarkable orbits. Other: None. ASPECTS (Alberta Stroke Program Early CT Score) - Ganglionic level infarction (caudate, lentiform nuclei, internal capsule, insula, M1-M3 cortex): 7 - Supraganglionic infarction (M4-M6 cortex): 3 Total score (0-10 with 10 being normal): 10 These results were communicated to Dr. Cort Mana at 6:37 pm on 04/21/2024 by text page via the [facility] messaging system. IMPRESSION: No evidence of acute intracranial abnormality. ASPECTS of 10. Electronically Signed   By: Dasie Hamburg M.D.   On: 04/21/2024 18:37   DG Chest Port 1 View if patient is in a treatment room. Result Date: 04/21/2024 CLINICAL DATA:  Suspected sepsis. EXAM: PORTABLE CHEST 1 VIEW COMPARISON:  Chest radiograph dated 05/02/2023. FINDINGS: Linear right lung base atelectasis/scarring. No focal consolidation, pleural effusion or pneumothorax. The cardiac silhouette is within limits. No acute osseous pathology. IMPRESSION: No active disease. Electronically Signed   By:  Vanetta Chou M.D.   On: 04/21/2024 10:43    Labs: Basic Metabolic Panel: Recent Labs  Lab 05/04/24 0340 05/05/24 0345 05/06/24 0519 05/07/24 0701 05/08/24 0433 05/09/24 0824  NA 141 140 140 143 146* 146*  K 4.5 3.2* 3.9 3.9 3.3* 3.9  CL 105 107 108 105 110 106  CO2 24 25 23 24 24 25   GLUCOSE 92 93 107* 98 84 89  BUN 19 15 12 11 13 16   CREATININE 1.10* 0.99 1.04* 0.93 0.94 1.14*  CALCIUM 9.4 8.6* 8.7* 8.9 8.7* 8.9  MG 2.3 2.2 1.9 1.9 2.1  --   PHOS 3.9  --   --  3.5  --   --    CBC: Recent Labs  Lab 05/04/24 0340 05/05/24 0345 05/07/24 0701 05/08/24 0433 05/09/24 0824  WBC 15.8* 11.6* 12.5* 13.2* 13.2*  HGB 11.8* 10.7* 11.2* 10.9* 11.8*  HCT 36.5 32.7* 35.8* 34.4* 37.6  MCV 91.0 91.3 92.0 91.2 91.7  PLT 536* 475* 539* 491* 433*   Microbiology: Results for orders placed or performed during the hospital encounter of 05/02/24  Culture, blood (Routine X 2) w Reflex to ID Panel     Status: None   Collection Time: 05/03/24  1:04 PM   Specimen: BLOOD  Result Value Ref Range Status   Specimen Description BLOOD BLOOD RIGHT HAND  Final   Special Requests   Final    BOTTLES DRAWN AEROBIC AND ANAEROBIC Blood Culture results may not be optimal due to an inadequate volume of blood received in culture bottles   Culture   Final    NO GROWTH 5 DAYS Performed at [facility], [address], [place], [place] [address]    Report Status 05/08/2024 FINAL  Final  Culture, blood (Routine X 2) w Reflex to ID Panel     Status: None   Collection Time: 05/03/24  1:04 PM  Specimen: BLOOD  Result Value Ref Range Status   Specimen Description BLOOD BLOOD LEFT HAND  Final   Special Requests   Final    BOTTLES DRAWN AEROBIC AND ANAEROBIC Blood Culture adequate volume   Culture   Final    NO GROWTH 5 DAYS Performed at [facility], [address], [place], [place] [address]    Report Status 05/08/2024 FINAL  Final    Time coordinating discharge: Over 30  minutes  Marien LITTIE Piety, MD  [facility] 05/10/2024, 1:32 PM

## 2024-05-10 NOTE — TOC Transition Note (Signed)
 Transition of Care [facility]) - Discharge Note   Patient Details  Name: [name] MRN: [id number] Date of Birth: 09-Apr-1971  Transition of Care [facility]) CM/SW Contact:  Dalia GORMAN Fuse, RN Phone Number: 05/10/2024, 1:50 PM   Clinical Narrative:     Patient is medically ready for discharge to home with her mother and caregivers. [facility] will transport. The patient's family is in agreement with the discharge plan.    Barriers to Discharge: Barriers Resolved   Patient Goals and CMS Choice            Discharge Placement                       Discharge Plan and Services Additional resources added to the After Visit Summary for                                       Social Drivers of Health (SDOH) Interventions SDOH Screenings   Food Insecurity: Patient Unable To Answer (05/02/2024)  Housing: Patient Unable To Answer (05/02/2024)  Transportation Needs: Patient Unable To Answer (05/02/2024)  Utilities: Patient Unable To Answer (05/02/2024)  Financial Resource Strain: Low Risk  (01/24/2024)   Received from [facility]  Physical Activity: Inactive (11/03/2022)   Received from [facility]  Social Connections: Moderately Isolated (11/03/2022)   Received from [facility]  Stress: No Stress Concern Present (11/03/2022)   Received from [facility]  Tobacco Use: Low Risk  (05/02/2024)  Health Literacy: Low Risk  (04/05/2024)   Received from [facility]     Readmission Risk Interventions     No data to display

## 2024-05-10 NOTE — Plan of Care (Signed)
 Patient seems to be improving both physically and cognitively.   Problem: Education: Goal: Knowledge of General Education information will improve Description: Including pain rating scale, medication(s)/side effects and non-pharmacologic comfort measures Outcome: Progressing   Problem: Health Behavior/Discharge Planning: Goal: Ability to manage health-related needs will improve Outcome: Progressing   Problem: Clinical Measurements: Goal: Ability to maintain clinical measurements within normal limits will improve Outcome: Progressing Goal: Will remain free from infection Outcome: Progressing Goal: Diagnostic test results will improve Outcome: Progressing Goal: Respiratory complications will improve Outcome: Progressing Goal: Cardiovascular complication will be avoided Outcome: Progressing   Problem: Activity: Goal: Risk for activity intolerance will decrease Outcome: Progressing   Problem: Nutrition: Goal: Adequate nutrition will be maintained Outcome: Progressing   Problem: Coping: Goal: Level of anxiety will decrease Outcome: Progressing   Problem: Elimination: Goal: Will not experience complications related to bowel motility Outcome: Progressing Goal: Will not experience complications related to urinary retention Outcome: Progressing   Problem: Pain Managment: Goal: General experience of comfort will improve and/or be controlled Outcome: Progressing   Problem: Safety: Goal: Ability to remain free from injury will improve Outcome: Progressing   Problem: Skin Integrity: Goal: Risk for impaired skin integrity will decrease Outcome: Progressing

## 2024-05-11 ENCOUNTER — Other Ambulatory Visit: Payer: Self-pay | Admitting: Student in an Organized Health Care Education/Training Program

## 2024-05-13 DIAGNOSIS — Z09 Encounter for follow-up examination after completed treatment for conditions other than malignant neoplasm: Principal | ICD-10-CM

## 2024-05-16 ENCOUNTER — Ambulatory Visit: Admit: 2024-05-16 | Payer: Medicare (Managed Care)

## 2024-05-16 ENCOUNTER — Inpatient Hospital Stay
Admit: 2024-05-16 | Discharge: 2024-05-25 | Disposition: A | Discharge status: Home Health | Payer: Medicare (Managed Care)

## 2024-05-16 ENCOUNTER — Ambulatory Visit: Admit: 2024-05-16 | Discharge: 2024-05-25 | Payer: Medicare (Managed Care)

## 2024-05-24 MED ORDER — AMANTADINE HCL 100 MG CAPSULE
ORAL_CAPSULE | ORAL | 0 refills | 6.00000 days
Start: 2024-05-24 — End: 2024-05-31

## 2024-05-24 MED ORDER — CIPROFLOXACIN 500 MG TABLET
ORAL_TABLET | Freq: Two times a day (BID) | ORAL | 0 refills | 3.00000 days
Start: 2024-05-24 — End: 2024-05-27

## 2024-05-25 MED ORDER — METOPROLOL SUCCINATE ER 25 MG TABLET,EXTENDED RELEASE 24 HR
ORAL_TABLET | Freq: Every day | ORAL | 0 refills | 90.00000 days | Status: CP
Start: 2024-05-25 — End: 2024-08-23

## 2024-05-25 MED ORDER — PREGABALIN 25 MG CAPSULE
ORAL_CAPSULE | Freq: Two times a day (BID) | ORAL | 0 refills | 30.00000 days | Status: CP
Start: 2024-05-25 — End: 2024-05-25

## 2024-05-25 MED ORDER — SULFAMETHOXAZOLE 800 MG-TRIMETHOPRIM 160 MG TABLET
ORAL_TABLET | Freq: Two times a day (BID) | ORAL | 0 refills | 2.00000 days | Status: CP
Start: 2024-05-25 — End: 2024-05-25

## 2024-05-25 MED ORDER — CIPROFLOXACIN 500 MG TABLET
ORAL_TABLET | Freq: Two times a day (BID) | ORAL | 0 refills | 2.00000 days | Status: CP
Start: 2024-05-25 — End: 2024-05-27

## 2024-05-28 DIAGNOSIS — Z09 Encounter for follow-up examination after completed treatment for conditions other than malignant neoplasm: Principal | ICD-10-CM

## 2024-05-29 MED ORDER — AMANTADINE HCL 100 MG CAPSULE
ORAL_CAPSULE | ORAL | 11 refills | 30.00000 days | Status: CP
Start: 2024-05-29 — End: 2024-05-25

## 2024-06-04 DIAGNOSIS — G35 Multiple sclerosis: Principal | ICD-10-CM

## 2024-06-04 DIAGNOSIS — M24522 Contracture, left elbow: Principal | ICD-10-CM

## 2024-06-11 NOTE — Progress Notes (Signed)
 Internal Medicine Clinic Visit  Reason for visit: Follow-up  A/P: [name] is a 53 yo F with a PMHx of multiple sclerosis with paraplegia and neurogenic bladder s/p ileal conduit, neuropathic pain, depression and multiple complicated UTIs (Klebsiella, Pseudomonas, Proteus) who presents to the clinic today for follow-up of her chronic issues.      1. Multiple sclerosis    (CMS-HCC)   2. 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 Neurogenic bladder   4. Other artificial openings of urinary tract status    (CMS-HCC)   5. Neurogenic bowel   6. Recurrent UTI   7. Primary hypertension   8. Benign paroxysmal positional vertigo due to bilateral vestibular disorder   9. 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 Follows with [facility] Neurology, last visit 04/10/2024. She was previously managed with Ocrevus but has since had DMT discontinued given high risk for urosepsis with frequent UTI. No clinical evidence of ongoing inflammation at July visit with Neurology. Treatment recommendation at this time included Gabapentin  200 mg in the morning, 400 mg at noon, 400 mg at 4 pm and 800 mg at night, Amantadine  200 mg BID and Baclofen  30 mg QID. These medications have since been discontinued/weaned during a recent hospitalization to [facility] for AMS presumably 2/2 neurotoxicity from these medications iso of AKI 2/2 urosepsis. Saw [facility] PM&R 9/9 for worsening of chronic spasticity and pain in LUE with increased left arm tightness. Noted to be developing a mild L elbow contracture, for which they recommended cushioning of her elbow, referral to P&O for L elbow extension brace and consideration of botulinum toxin injections in the future. During hospitalizations, patient and  family report gradual worsening of cognitive function but patient has had repeated cerebral imaging during prior hospitalizations with no evidence of visible progression of demyelinating disease. Per patient and her family, her mentation has dramatically improved since her most recent admission to [facility] in August after discontinuation/weaning of multiple agents. Her sister [name] is closely involved in the patient's care and agrees with discontinuation of the Amantadine  at this time. During her recent admission to [facility] she was experiencing hallucinations which her sister notes to have occurred with prior Amantadine  use. Patient and her sister are onboard with continuing Pregabalin and Duloxetine  at this time but are interested in further reducing her pharmacy regimen with discontinuation of duloxetine  when able. She is also interested in getting enrolled in CCM program and is requesting a new wheel chair, as her current one is having variable electronic function and she is unable to afford her out of pocket for a new one. I have initiated CCM enrollment and reached out to our care managers regarding how we can help patient get a new wheel chair.  - Upcoming Appointments  - [facility] Neurology 07/02/2024  - [facility] 08/02/2024 - Continue current medications: - Pregabalin 25 mg BID (refill rx'd) - Duloxetine  30 mg every day (refill rx'd) - Vitamin D  supplementation - Gabapentin  and Baclofen  discontinued at most recent hospitalization for AMS iso of AKI 2/2 presumed urosepsis. Her Amantadine  was weaned and we will stop this today.  - Continue scheduled Senna and Miralax  PRN - Patient is aware of the services offered, that only one practitioner can furnish CCM, that there may be cost-sharing, and  that they may withdraw at any time.     Patient Consent Status  During today's visit:  Who provided consent for Chronic Care Management? Patient Patient consented by verbal consent    Multiple  Sclerosis/Transverse Myelitis                    Take your medications exactly as directed. Call your healthcare provider if you are having any problems with your medicine. Use a cane, walker, or scooter if your healthcare provider suggests it Keep doing your normal activities as much as you can Consider joining a support group for people with MS Depression can be common.  Please let your provider know if you have trouble sleeping or are feeling sad all the time. Eat a balanced and healthy diet If able to, perform physical activity 3 to 5 times per week for at least 15 minutes each time See ID for evaluation for recurrent UTIs We are working to get your wheelchair       Chronic Pain                    Take your medications exactly as directed Go to your counseling sessions and follow-up appointments Get adequate amounts of sleep each night Perform physical activity 3 to 5 times per week for at least 15 minutes each time Try heat, cold packs, and massage Avoid alcohol, prescription sedative medications, and illegal drugs  Pace yourself and do not overdo do it.  It is important to break large tasks into smaller ones so you don't worsen your chronic pain. If you think you are depressed, talk to your healthcare provider about treatment. If you are taking pain medicine, reduce your risk of constipation by drinking plenty of water and eating fruits and vegetables, using stool softeners when indicated by your healthcare provider.    If the patient meets clinical pharmacist/clinical pharmacist practitioner referral criteria, I authorize the [facility] clinical pharmacist/clinical pharmacist practitioner to adjust the medication regimen to assist the patient to safely reach their care plan goals. I understand the pharmacist will provide progress documentation to me via Epic and engage me if the patient's needs exceed the scope of the care plan.  Current Active List of Medical Conditions    Problem List           Diagnosed    . 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 Neurogenic bowel     . Recurrent MDR UTIs     . Obesity (Chronic)     . Edema of both legs     . History of urostomy     . Nerve pain     . Paralysis of both lower limbs     . Multiple sclerosis     . High cholesterol or triglycerides     . Chronic constipation     . High blood pressure     . Bilateral leg pain     . Fungal nail infection     . History of hyperlipidemia     . Health maintenance examination     . Skin lesion of right leg     . Left ear pain     . Problems with swallowing     . Decubitus ulcer of ischial area, left, stage III     . Abscess of buttock     . Acute osteomyelitis of left pelvic region     .  History of hysterectomy     . Syncope     . Pressure ulcer of left lower back, stage 4 (CMS-HCC)     . ALTERED MENTAL STATUS     . 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 Other artificial openings of urinary tract status       #Neurogenic Bladder s/p Ileal Conduit  #Recurrent UTIs Previously followed with [facility] s/p ileal loop urinary diversion. Recurrent UTIs has been a chronic issue for her. Most recently admitted for 9 days to [facility] for AMS. Presumably 2/2 amantadine  and gabapentin  neurotoxicity iso of AKI 2/2 urosepsis (Cx positive for Pseudomonas and Morganella). Discharged on 2 additional days of antibiotics TMP/SMX and Ciprofloxacin . No evidence of pain around ileostomy site on today's visit. Considered additing methenamine but less evidence for its efficacy iso of neurogenic bladder.  - Referral to [facility] for recommendations regarding frequent UTIs.   #Hypertension Started on Lopressor  and irbesartan  during 8/7 admission for hypertension and tachycardia. Subsequently has had variable blood pressures on subsequent health  care encounters as well as at home per family. At latest 8/21 admission, her irbesartan  was paused and she was transitioned from Lopressor  to Toprol  XL.  - Continue Toprol  XL (refill rx'd) - BMP today to monitor renal functino  #BPPV Patient endorses vertigo-like spinning sensation when she is placed on her side while her aid is washing her. States that this has been occurring for a while and that it is becoming more frequent. She often closes her eyes to help the dizziness go away, but never feels nauseous.  - Referral to Vestibular PT  #GERD - Continue Protonix  40 mg every day  [facility] - Due for colonoscopy since 2024 (deferred) - Administered flu vaccine today, patient defers COVID-19 or Shingles  Return in about 6 weeks (around 07/24/2024) for In-person.  Staffed with Dr. Estefana Mose, seen and discussed  __________________________________________________________  HPI:  History of Present Illness [name] is a 53 year old female with multiple sclerosis and recurrent urinary tract infections who presents for medication management and follow-up after recent hospitalizations. She is accompanied via phone by her sister, [name], who is involved with her medication management.  She is currently on a medication regimen that includes vitamin D , Protonix , pregabalin, duloxetine , and metoprolol  succinate 12.5 mg once daily. She also takes Senna at night and Miralax  as needed. Gabapentin  has been discontinued and replaced with pregabalin. Amantadine  is being tapered off and is currently taken every other day.  She has a history of multiple sclerosis and has been experiencing pain and muscle spasms. She no longer experiences the pain and spasms as she used to, stating, 'I don't have the pains like I used to.' She might feel pain when exposed to cold or heat, but it is not as frequent as before. She is currently taking pregabalin and duloxetine  for pain management, along with Tylenol  as  needed.  She has been experiencing recurrent urinary tract infections, which have previously led to kidney issues. She denies any current pain at her ostomy site. During her last hospitalization, her kidneys were affected due to a UTI, which also impacted the clearance of her pain medications, leading to confusion. She confirms that she was 'very confused' during that time.   She has a history of high blood pressure and is currently taking metoprolol  succinate at a dose of 12.5 mg daily. Her blood pressure was previously low when she  was on multiple blood pressure medications, but it is now stable at 125/62 mmHg with only metoprolol .  She reports difficulty lifting her left arm, which is related to her multiple sclerosis. Her left side has been affected since her diagnosis, and her left arm tends to get tired and is difficult to lift, especially when she is in bed or sitting. She also experiences dizziness when her caregiver turns her over, which she manages by closing her eyes but now is becoming more frequent. No nausea with the dizziness.  Since last seen in clinic: 05/16/2024: Admitted for 9 days to [facility] for AMS. Presumably 2/2 amantadine  and gabapentin  neurotoxicity iso of AKI 2/2 urosepsis (Cx positive for Pseudomonas and Morganella). Discharged on 2 additional days of antibiotics TMP/SMX and Ciprofloxacin . 05/02/2024: Admitted for 8 days to [facility] for AMS. Treated with Unasyn  while admitted and had recovery to her baseline mental status. Palliative care was consulted and she was referred to follow up with hospice outpatient.  04/26/2024: Admitted for one day to [facility] for questionable episode of syncope. Cardiology work up unremarkable, dischargedhome 04/21/2024: Admitted 4 days to [facility] for AMS 2/2 urosepsis. Urine Cx grew MRSA and pseudomonas 03/23/2024: Admitted to [facility] for UTI treated with  Ceftriaxone . Discharged on prophylactic Bactrim .  03/18/2024: Presented to [facility] for acute midline low back pain and underwent manual disimpaction and administration of bisacodyl  suppository with improvement in symptoms.  02/29/2024: Seen in ED by [facility] for worsening mental status and cognitive functioning. MRI with no evidence of acute MS flare or new lesions. Discharged home. __________________________________________________________  Medications:  Reviewed in EPIC __________________________________________________________  Physical Exam:  Vital Signs: Vitals:   06/12/24 1041  BP: 125/62  BP Site: R Arm  BP Position: Sitting  BP Cuff Size: Medium  Pulse: 81  Resp: 18  Temp: 36.7 C (98 F)  TempSrc: Temporal  SpO2: 99%  Height: 162.6 cm (5' 4.02)      Gen: Well appearing, NAD CV: RRR, no murmurs Pulm: CTA bilaterally, no crackles or wheezes Abd: Soft, NTND, normal BS. Patent ileal conduit noted in right lower quadrant, bag filled with normal appearing urine. No suprapubic tenderness Ext: No edema, left upper extremity contracture.   Medication adherence and barriers to the treatment plan have been addressed. Opportunities to optimize healthy behaviors have been discussed. Patient / caregiver voiced understanding.  Quintin Jama Bristol, MD [facility] Internal Medicine, PGY-1

## 2024-06-12 DIAGNOSIS — N390 Urinary tract infection, site not specified: Principal | ICD-10-CM

## 2024-06-12 DIAGNOSIS — K219 Gastro-esophageal reflux disease without esophagitis: Principal | ICD-10-CM

## 2024-06-12 DIAGNOSIS — N319 Neuromuscular dysfunction of bladder, unspecified: Principal | ICD-10-CM

## 2024-06-12 DIAGNOSIS — G8194 Hemiplegia, unspecified affecting left nondominant side: Principal | ICD-10-CM

## 2024-06-12 DIAGNOSIS — I10 Essential (primary) hypertension: Principal | ICD-10-CM

## 2024-06-12 DIAGNOSIS — K592 Neurogenic bowel, not elsewhere classified: Principal | ICD-10-CM

## 2024-06-12 DIAGNOSIS — H8113 Benign paroxysmal vertigo, bilateral: Principal | ICD-10-CM

## 2024-06-12 DIAGNOSIS — G35 Multiple sclerosis: Principal | ICD-10-CM

## 2024-06-12 DIAGNOSIS — Z936 Other artificial openings of urinary tract status: Principal | ICD-10-CM

## 2024-06-12 MED ORDER — PREGABALIN 25 MG CAPSULE
ORAL_CAPSULE | Freq: Two times a day (BID) | ORAL | 0 refills | 30.00000 days | Status: CP
Start: 2024-06-12 — End: 2024-07-12

## 2024-06-12 MED ORDER — PANTOPRAZOLE 40 MG TABLET,DELAYED RELEASE
ORAL_TABLET | Freq: Every day | ORAL | 3 refills | 90.00000 days | Status: CP
Start: 2024-06-12 — End: ?

## 2024-06-12 MED ORDER — METOPROLOL SUCCINATE ER 25 MG TABLET,EXTENDED RELEASE 24 HR
ORAL_TABLET | Freq: Every day | ORAL | 0 refills | 90.00000 days
Start: 2024-06-12 — End: 2024-09-10

## 2024-06-12 MED ORDER — DULOXETINE 30 MG CAPSULE,DELAYED RELEASE
ORAL_CAPSULE | Freq: Every day | ORAL | 1 refills | 100.00000 days | Status: CP
Start: 2024-06-12 — End: ?

## 2024-06-14 NOTE — Progress Notes (Signed)
 I saw and evaluated the patient, participating in the key portions of the service.?? I reviewed the resident???s note.?? I agree with the resident???s findings and plan. Jamie Kato, MD

## 2024-06-24 ENCOUNTER — Encounter
Admission: EM | Disposition: A | Discharge status: Home / Self Care | Payer: Self-pay | Source: Home / Self Care | Attending: Emergency Medicine

## 2024-06-24 ENCOUNTER — Emergency Department
Admission: EM | Admit: 2024-06-24 | Discharge: 2024-06-25 | Disposition: A | Discharge status: Home / Self Care | Attending: Emergency Medicine | Admitting: Emergency Medicine

## 2024-06-24 ENCOUNTER — Emergency Department

## 2024-06-24 DIAGNOSIS — J988 Other specified respiratory disorders: Secondary | ICD-10-CM | POA: Insufficient documentation

## 2024-06-24 DIAGNOSIS — I5020 Unspecified systolic (congestive) heart failure: Secondary | ICD-10-CM | POA: Diagnosis not present

## 2024-06-24 DIAGNOSIS — G35 Multiple sclerosis: Secondary | ICD-10-CM | POA: Diagnosis not present

## 2024-06-24 DIAGNOSIS — W44B9XA Other plastic object entering into or through a natural orifice, initial encounter: Secondary | ICD-10-CM | POA: Insufficient documentation

## 2024-06-24 DIAGNOSIS — I110 Hypertensive heart disease with heart failure: Secondary | ICD-10-CM | POA: Diagnosis not present

## 2024-06-24 DIAGNOSIS — W449XXA Unspecified foreign body entering into or through a natural orifice, initial encounter: Secondary | ICD-10-CM | POA: Diagnosis not present

## 2024-06-24 DIAGNOSIS — T18108A Unspecified foreign body in esophagus causing other injury, initial encounter: Secondary | ICD-10-CM | POA: Insufficient documentation

## 2024-06-24 DIAGNOSIS — N319 Neuromuscular dysfunction of bladder, unspecified: Secondary | ICD-10-CM | POA: Diagnosis not present

## 2024-06-24 DIAGNOSIS — Z91040 Latex allergy status: Secondary | ICD-10-CM | POA: Insufficient documentation

## 2024-06-24 DIAGNOSIS — I5032 Chronic diastolic (congestive) heart failure: Secondary | ICD-10-CM | POA: Diagnosis not present

## 2024-06-24 HISTORY — PX: ESOPHAGOGASTRODUODENOSCOPY: SHX5428

## 2024-06-24 SURGERY — EGD (ESOPHAGOGASTRODUODENOSCOPY)
Anesthesia: General

## 2024-06-24 MED ORDER — MORPHINE SULFATE (PF) 4 MG/ML IV SOLN
4.0000 mg | Freq: Once | INTRAVENOUS | Status: AC
  Administered 2024-06-24: 4 mg via INTRAVENOUS
  Filled 2024-06-24: qty 1

## 2024-06-24 NOTE — Consult Note (Signed)
 Consultation  Referring Provider:     ED Admit date: 06/24/2024 Consult date: 06/24/2024         Reason for Consultation: Foreign Body              HPI:   [name] is a 53 y.o. lady with history of MS, neurogenic bladder, and ileal conduit here with concern for foreign body ingestion. States she was taking her pills at 8 pm and then she swallowed part of her pill container. Noted immediate odynophagia and changing in voice quality. No blood thinners. No neck surgeries. She had an EGD about one year ago for dysphagia that was normal.  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   Edema leg 09/05/2014   History of construction of external stoma of urinary system 04/29/2015   MS (multiple sclerosis) 2005   Multiple sclerosis, primary progressive 01/09/2013   Neurogenic bowel 08/12/2012   Neuropathic pain 05/21/2015    Past Surgical History:  Procedure Laterality Date   ABDOMINAL HYSTERECTOMY  2001   BACK SURGERY  2004   REVISION UROSTOMY CUTANEOUS  06/2012    Family History  Problem Relation Age of Onset   Osteoarthritis Mother    Hypertension Mother    Hyperlipidemia Mother    Thyroid disease Mother    Liver cancer Father    Asthma Brother     Social History   Tobacco Use   Smoking status: Never   Smokeless tobacco: Never  Substance Use Topics   Alcohol use: Not Currently    Comment: socially   Drug use: No    Prior to Admission medications   Medication Sig Start Date End Date Taking? Authorizing Provider  acetaminophen  (TYLENOL ) 500 MG tablet Take 500 mg by mouth every 6 (six) hours as needed.    [provider]  amantadine  (SYMMETREL ) 100 MG capsule Take 200 mg by mouth 2 (two) times daily.    [provider]  bisacodyl  (DULCOLAX) 5 MG EC tablet Take 1 tablet (5 mg total) by mouth daily as needed for moderate constipation. 03/18/24 03/18/25  Willo Dunnings, MD  DULoxetine   (CYMBALTA ) 30 MG capsule Take 1 capsule (30 mg total) by mouth daily. 05/11/24 06/10/24  Lenon Marien LITTIE, MD  gabapentin  (NEURONTIN ) 100 MG capsule Take by mouth in the morning, at noon, and at bedtime. Take 200 mg by mouth in the morning (8 am), 400 mg at 12 pm (noon), and 400 mg at 4 pm. 06/17/21   [provider]  gabapentin  (NEURONTIN ) 800 MG tablet Take 800 mg by mouth at bedtime. 07/08/21   [provider]  irbesartan  (AVAPRO ) 75 MG tablet Take 1 tablet (75 mg total) by mouth daily. 05/11/24 06/10/24  Lenon Marien LITTIE, MD  lactulose  (CHRONULAC ) 10 GM/15ML solution Take 10 g by mouth daily. 01/25/24   [provider]  metoprolol  tartrate (LOPRESSOR ) 25 MG tablet Take 0.5 tablets (12.5 mg total) by mouth 2 (two) times daily. 05/10/24 06/09/24  Lenon Marien LITTIE, MD  ondansetron  (ZOFRAN -ODT) 4 MG disintegrating tablet Take 1 tablet (4 mg total) by mouth every 8 (eight) hours as needed. 03/18/24   Claudene Rover, MD  pantoprazole  (PROTONIX ) 40 MG tablet Take 1 tablet by mouth daily. 10/02/23   [provider]  senna (SENOKOT) 8.6 MG tablet Take 2 tablets by mouth 2 (two) times daily. 06/24/22   [provider]  Silver  (AQUACEL AG FOAM) 6X6 PADS Apply 1 each  topically daily in the afternoon. 03/16/23   Wouk, Devaughn Sayres, MD  Wound Dressings (FOAM DRESSING CIRCULAR BORDER) PADS Apply 1 each topically every 3 (three) days. 03/16/23   Wouk, Devaughn Sayres, MD    No current facility-administered medications for this encounter.   Current Outpatient Medications  Medication Sig Dispense Refill   acetaminophen  (TYLENOL ) 500 MG tablet Take 500 mg by mouth every 6 (six) hours as needed.     amantadine  (SYMMETREL ) 100 MG capsule Take 200 mg by mouth 2 (two) times daily.  5   bisacodyl  (DULCOLAX) 5 MG EC tablet Take 1 tablet (5 mg total) by mouth daily as needed for moderate constipation. 30 tablet 0   DULoxetine  (CYMBALTA ) 30 MG capsule Take 1 capsule (30 mg total) by  mouth daily. 30 capsule 0   gabapentin  (NEURONTIN ) 100 MG capsule Take by mouth in the morning, at noon, and at bedtime. Take 200 mg by mouth in the morning (8 am), 400 mg at 12 pm (noon), and 400 mg at 4 pm.     gabapentin  (NEURONTIN ) 800 MG tablet Take 800 mg by mouth at bedtime.     irbesartan  (AVAPRO ) 75 MG tablet Take 1 tablet (75 mg total) by mouth daily. 30 tablet 0   lactulose  (CHRONULAC ) 10 GM/15ML solution Take 10 g by mouth daily.     metoprolol  tartrate (LOPRESSOR ) 25 MG tablet Take 0.5 tablets (12.5 mg total) by mouth 2 (two) times daily. 15 tablet 0   ondansetron  (ZOFRAN -ODT) 4 MG disintegrating tablet Take 1 tablet (4 mg total) by mouth every 8 (eight) hours as needed. 20 tablet 0   pantoprazole  (PROTONIX ) 40 MG tablet Take 1 tablet by mouth daily.     senna (SENOKOT) 8.6 MG tablet Take 2 tablets by mouth 2 (two) times daily.     Silver  (AQUACEL AG FOAM) 6X6 PADS Apply 1 each topically daily in the afternoon. 30 each 1   Wound Dressings (FOAM DRESSING CIRCULAR BORDER) PADS Apply 1 each topically every 3 (three) days. 30 each 1    Allergies as of 06/24/2024 - Review Complete 06/24/2024  Allergen Reaction Noted   Latex Swelling and Other (See Comments) 07/19/2015   Vancomycin  Itching 07/17/2023     Review of Systems:    All systems reviewed and negative except where noted in HPI.  Review of Systems  Constitutional:  Negative for chills and fever.  Respiratory:  Positive for shortness of breath.   Cardiovascular:  Positive for chest pain.  Gastrointestinal:  Positive for nausea.  Genitourinary:  Positive for dysuria.  Musculoskeletal:  Positive for joint pain.  Skin:  Negative for rash.  Neurological:  Negative for focal weakness.  Psychiatric/Behavioral:  Negative for substance abuse.   All other systems reviewed and are negative.     Physical Exam:  Vital signs in last 24 hours: Temp:  [97.7 F (36.5 C)] 97.7 F (36.5 C) (09/29 2104) Pulse Rate:  [87-110] 110  (09/29 2201) Resp:  [18-19] 19 (09/29 2201) BP: (145)/(102) 145/102 (09/29 2104) SpO2:  [99 %] 99 % (09/29 2201)   General:   In mild distress Head:  Normocephalic and atraumatic. Eyes:   No icterus.   Conjunctiva pink. Ears:  Normal auditory acuity. Mouth: Mucosa pink moist, no lesions. Neck:  Supple; no masses felt Lungs: No respiratory distress Abdomen:   Flat, soft, nondistended, nontender Rectal:  Not performed.  Msk:  No clubbing or cyanosis Neurologic:  Alert and  oriented x4;  No focal deficits Skin:  Warm, dry, pink without significant lesions or rashes. Psych:  Alert and cooperative. Normal affect.  LAB RESULTS: No results for input(s): WBC, HGB, HCT, PLT in the last 72 hours. BMET No results for input(s): NA, K, CL, CO2, GLUCOSE, BUN, CREATININE, CALCIUM in the last 72 hours. LFT No results for input(s): PROT, ALBUMIN, AST, ALT, ALKPHOS, BILITOT, BILIDIR, IBILI in the last 72 hours. PT/INR No results for input(s): LABPROT, INR in the last 72 hours.  STUDIES: DG Neck Soft Tissue Result Date: 06/24/2024 CLINICAL DATA:  Foreign body sensation in the throat. The patient accidentally swallowed a piece of her pill container. EXAM: NECK SOFT TISSUES - 1+ VIEW COMPARISON:  Cervical spine MRI 04/21/2024 FINDINGS: AP Lat two views. The AP view is suboptimally position, with the patient rotated to the right. There is no evidence of retropharyngeal soft tissue swelling or epiglottic enlargement. The cervical airway is unremarkable and no radio-opaque foreign body identified. IMPRESSION: 1. No radiopaque foreign body identified. 2. Suboptimal positioning on the AP view. Electronically Signed   By: Francis Quam M.D.   On: 06/24/2024 22:23   DG Chest Port 1 View Result Date: 06/24/2024 EXAM: 1 VIEW(S) XRAY OF THE CHEST 06/24/2024 10:08:33 PM COMPARISON: 05/07/2024 CLINICAL HISTORY: Possible airway obstruction. Pt tipped her pill box back and  possibly swallowed the top square piece of the pill container. About 1cm x1cm in size. Pt states she feels like it is stuck in her esophagus. FINDINGS: LUNGS AND PLEURA: Low lung volumes. Linear opacity at right lung base stable from previous, consistent with scar or atelectasis. No radiopaque foreign body. No pulmonary edema. No pleural effusion. No pneumothorax. HEART AND MEDIASTINUM: Overlapping cardiac leads. Unchanged heart size and mediastinal contours. BONES AND SOFT TISSUES: No acute osseous abnormality. IMPRESSION: 1. No acute abnormalities. No radiopaque foreign body. Electronically signed by: Andrea Gasman MD 06/24/2024 10:22 PM EDT RP Workstation: [id number]       Impression / Plan:   53 y.o. lady with history of MS, neurogenic bladder, and ileal conduit here with concern for foreign body ingestion. Will plan for EGD tonight  - NPO - EGD tonight - further recs after procedure  Frederic Schick MD, MPH [facility]

## 2024-06-24 NOTE — ED Triage Notes (Signed)
 Pt arrived to ED via [facility] for possible airway obstruction. Pt tipped her pill box back and possibly swallowed the top square piece of the pill container. About 1cm x1cm in size. Airway is intact and pt is speaking in full sentences. Pt states she feels like it is stuck in her esophagus. 96% RA. Pt has multiple sclerosis at baseline. Pt A&Ox4. Lung sounds clear

## 2024-06-24 NOTE — ED Provider Notes (Signed)
 [facility] Provider Note    Event Date/Time   First MD Initiated Contact with Patient 06/24/24 2056     (approximate)   History   Chief Complaint Airway Obstruction (/)   HPI  [name] is a 53 y.o. female with past medical history of multiple sclerosis, neurogenic bladder, hypertension, and HFpEF who presents to the ED for airway obstruction.  Per EMS, does prior to arrival patient was attempting to swallow her medications when she accidentally swallowed the top of her pill container.  EMS describes a square piece of plastic about the size of the tip of a thumb.  Since then, patient describes pain around her sternal notch, especially when attempting to swallow.  She denies any difficulty breathing, but communicated with EMS primarily through writing.  She denies intentionally swallowing the pill top.     Physical Exam   Triage Vital Signs: ED Triage Vitals  Encounter Vitals Group     BP      Girls Systolic BP Percentile      Girls Diastolic BP Percentile      Boys Systolic BP Percentile      Boys Diastolic BP Percentile      Pulse      Resp      Temp      Temp src      SpO2      Weight      Height      Head Circumference      Peak Flow      Pain Score      Pain Loc      Pain Education      Exclude from Growth Chart     Most recent vital signs: Vitals:   06/24/24 2104 06/24/24 2201  BP: (!) 145/102   Pulse: 87 (!) 110  Resp: 18 19  Temp: 97.7 F (36.5 C)   SpO2: 99% 99%    Constitutional: Alert and oriented. Eyes: Conjunctivae are normal. Head: Atraumatic. Nose: No congestion/rhinnorhea. Mouth/Throat: Mucous membranes are moist.  Posterior oropharynx clear, patient with difficulty tolerating oral secretions.  No stridor noted. Cardiovascular: Normal rate, regular rhythm. Grossly normal heart sounds.  2+ radial pulses bilaterally. Respiratory: Normal respiratory effort.  No retractions. Lungs CTAB. Gastrointestinal: Soft  and nontender. No distention. Musculoskeletal: No lower extremity tenderness nor edema.  Neurologic:  Normal speech and language. No gross focal neurologic deficits are appreciated.    ED Results / Procedures / Treatments   Labs (all labs ordered are listed, but only abnormal results are displayed) Labs Reviewed - No data to display   EKG  ED ECG REPORT I, Carlin Palin, the attending physician, personally viewed and interpreted this ECG.   Date: 06/24/2024  EKG Time: 21:05  Rate: 86  Rhythm: normal sinus rhythm  Axis: LAD  Intervals:none  ST&T Change: None  RADIOLOGY Chest x-ray and neck x-ray reviewed and interpreted by me with no obvious foreign body.  PROCEDURES:  Critical Care performed: No  Procedures   MEDICATIONS ORDERED IN ED: Medications  morphine  (PF) 4 MG/ML injection 4 mg (4 mg Intravenous Given 06/24/24 2215)     IMPRESSION / MDM / ASSESSMENT AND PLAN / ED COURSE  I reviewed the triage vital signs and the nursing notes.                              53 y.o. female with past medical history  of multiple sclerosis, neurogenic bladder, hypertension, and HFpEF who presents to the ED after accidentally swallowing the top of her plastic pill container, now reports pain when swallowing and some difficulty speaking.  Patient's presentation is most consistent with acute presentation with potential threat to life or bodily function.  Differential diagnosis includes, but is not limited to, airway obstruction, esophageal foreign body, foreign body in the stomach.  Patient nontoxic-appearing and in no acute distress, vital signs are unremarkable.  She is not in any respiratory distress, now speaking normally after initially having difficulty with EMS.  There is no stridor noted and I have low suspicion that the foreign body is located in her airway.  We will obtain x-rays of her neck and chest to assess positioning of potential foreign body.  She does currently  have difficulty with oral secretions and I suspect it may be located in her esophagus.  X-ray imaging is unremarkable, patient continues to complain of significant pain when attempting to swallow.  Pain improved following IV morphine  and case discussed with Dr. Maryruth of GI, who will plan on endoscopy.      FINAL CLINICAL IMPRESSION(S) / ED DIAGNOSES   Final diagnoses:  Foreign body in esophagus, initial encounter     Rx / DC Orders   ED Discharge Orders     None        Note:  This document was prepared using Dragon voice recognition software and may include unintentional dictation errors.   Willo Dunnings, MD 06/24/24 [phone]

## 2024-06-24 NOTE — ED Notes (Signed)
 Airway is clear. No foreign body seen in pt's airway. Pt A&Ox4. Answering full sentences

## 2024-06-25 ENCOUNTER — Emergency Department: Admitting: Anesthesiology

## 2024-06-25 ENCOUNTER — Encounter: Payer: Self-pay | Admitting: Gastroenterology

## 2024-06-25 ENCOUNTER — Encounter: Admitting: Anesthesiology

## 2024-06-25 MED ORDER — SUGAMMADEX SODIUM 200 MG/2ML IV SOLN
INTRAVENOUS | Status: DC | PRN
  Administered 2024-06-25: 200 mg via INTRAVENOUS

## 2024-06-25 MED ORDER — ROCURONIUM BROMIDE 100 MG/10ML IV SOLN
INTRAVENOUS | Status: DC | PRN
  Administered 2024-06-25: 40 mg via INTRAVENOUS

## 2024-06-25 MED ORDER — SUCCINYLCHOLINE CHLORIDE 200 MG/10ML IV SOSY
PREFILLED_SYRINGE | INTRAVENOUS | Status: AC
  Filled 2024-06-25: qty 10

## 2024-06-25 MED ORDER — DEXAMETHASONE SODIUM PHOSPHATE 10 MG/ML IJ SOLN
INTRAMUSCULAR | Status: DC | PRN
  Administered 2024-06-25: 10 mg via INTRAVENOUS

## 2024-06-25 MED ORDER — LABETALOL HCL 5 MG/ML IV SOLN
10.0000 mg | Freq: Once | INTRAVENOUS | Status: AC
  Administered 2024-06-25: 10 mg via INTRAVENOUS
  Filled 2024-06-25: qty 4

## 2024-06-25 MED ORDER — LABETALOL HCL 5 MG/ML IV SOLN
INTRAVENOUS | Status: AC
  Filled 2024-06-25: qty 4

## 2024-06-25 MED ORDER — PROPOFOL 1000 MG/100ML IV EMUL
INTRAVENOUS | Status: AC
  Filled 2024-06-25: qty 100

## 2024-06-25 MED ORDER — GLYCOPYRROLATE 0.2 MG/ML IJ SOLN
INTRAMUSCULAR | Status: DC | PRN
  Administered 2024-06-25: .2 mg via INTRAVENOUS

## 2024-06-25 MED ORDER — SODIUM CHLORIDE 0.9 % IV SOLN
INTRAVENOUS | Status: DC
Start: 2024-06-25 — End: 2024-06-25

## 2024-06-25 MED ORDER — ALBUTEROL SULFATE HFA 108 (90 BASE) MCG/ACT IN AERS
INHALATION_SPRAY | RESPIRATORY_TRACT | Status: DC | PRN
  Administered 2024-06-25: 2 via RESPIRATORY_TRACT

## 2024-06-25 MED ORDER — PROPOFOL 10 MG/ML IV BOLUS
INTRAVENOUS | Status: DC | PRN
Start: 2024-06-25 — End: 2024-06-25
  Administered 2024-06-25: 110 mg via INTRAVENOUS

## 2024-06-25 MED ORDER — PROPOFOL 500 MG/50ML IV EMUL
INTRAVENOUS | Status: DC | PRN
  Administered 2024-06-25: 150 ug/kg/min via INTRAVENOUS

## 2024-06-25 MED ORDER — PHENYLEPHRINE HCL (PRESSORS) 10 MG/ML IV SOLN
INTRAVENOUS | Status: DC | PRN
  Administered 2024-06-25 (×2): 80 ug via INTRAVENOUS

## 2024-06-25 NOTE — Discharge Instructions (Signed)
 The pill container piece was removed from your upper esophagus.  You may have some sore throat for the next few days, which will heal on its own. Avoid spicy, acidic, or crunchy foods for the next week to ensure a smooth recovery.

## 2024-06-25 NOTE — ED Notes (Signed)
 Talked with endo and pt will go for procedure and then come back down to ED for us  to set up transport with [facility]

## 2024-06-25 NOTE — ED Notes (Signed)
 Pt is back from endoscopy. PT will await transport with [facility]. Vitals are WNL. Pt A&Ox4. Breathing WNL. Airway is intact

## 2024-06-25 NOTE — Anesthesia Postprocedure Evaluation (Signed)
 Anesthesia Post Note  Patient: [name] Ada  Procedure(s) Performed: EGD (ESOPHAGOGASTRODUODENOSCOPY)  Patient location during evaluation: PACU Anesthesia Type: General Level of consciousness: awake and alert, oriented and patient cooperative Pain management: pain level controlled Vital Signs Assessment: post-procedure vital signs reviewed and stable Respiratory status: spontaneous breathing, nonlabored ventilation and respiratory function stable Cardiovascular status: blood pressure returned to baseline and stable Postop Assessment: adequate PO intake Anesthetic complications: no   No notable events documented.   Last Vitals:  Vitals:   06/25/24 0230 06/25/24 0309  BP: 112/70 136/82  Pulse: 91 88  Resp:  16  Temp:  36.8 C  SpO2:  96%    Last Pain:  Vitals:   06/25/24 0309  TempSrc: Oral  PainSc: 0-No pain                 Aslan Montagna

## 2024-06-25 NOTE — Anesthesia Procedure Notes (Signed)
 Procedure Name: Intubation Date/Time: 06/25/2024 1:12 AM  Performed by: Leontine Katz, CRNAPre-anesthesia Checklist: Patient identified, Patient being monitored, Timeout performed, Emergency Drugs available and Suction available Patient Re-evaluated:Patient Re-evaluated prior to induction Oxygen Delivery Method: Circle system utilized Preoxygenation: Pre-oxygenation with 100% oxygen Induction Type: IV induction Laryngoscope Size: 3 and McGrath Grade View: Grade I Tube type: Oral Tube size: 7.0 mm Number of attempts: 1 Airway Equipment and Method: Stylet and Video-laryngoscopy Placement Confirmation: ETT inserted through vocal cords under direct vision, positive ETCO2 and breath sounds checked- equal and bilateral Secured at: 21 cm Tube secured with: Tape Dental Injury: Teeth and Oropharynx as per pre-operative assessment

## 2024-06-25 NOTE — Op Note (Signed)
 [facility] Patient Name: [name] Procedure Date: 06/25/2024 12:06 AM MRN: [id number] Account #: [id number] Date of Birth: 1970-12-02 Admit Type: Inpatient Age: 53 Room: [facility] ENDO ROOM 4 Gender: Female Note Status: Finalized Instrument Name: Barnie GI Scope [id number] Procedure:             Upper GI endoscopy Indications:           Foreign body in the esophagus Providers:             Ole Schick MD, MD Medicines:             Monitored Anesthesia Care Complications:         No immediate complications. Procedure:             Pre-Anesthesia Assessment:                        - Prior to the procedure, a History and Physical was                         performed, and patient medications and allergies were                         reviewed. The patient is competent. The risks and                         benefits of the procedure and the sedation options and                         risks were discussed with the patient. All questions                         were answered and informed consent was obtained.                         Patient identification and proposed procedure were                         verified by the physician, the nurse, the                         anesthesiologist, the anesthetist and the technician                         in the endoscopy suite. Mental Status Examination:                         alert and oriented. Airway Examination: normal                         oropharyngeal airway and neck mobility. Respiratory                         Examination: clear to auscultation. CV Examination:                         normal. Prophylactic Antibiotics: The patient does not                         require prophylactic antibiotics. Prior  Anticoagulants: The patient has taken no anticoagulant                         or antiplatelet agents. ASA Grade Assessment: III - A                         patient with severe  systemic disease. After reviewing                         the risks and benefits, the patient was deemed in                         satisfactory condition to undergo the procedure. The                         anesthesia plan was to use general anesthesia.                         Immediately prior to administration of medications,                         the patient was re-assessed for adequacy to receive                         sedatives. The heart rate, respiratory rate, oxygen                         saturations, blood pressure, adequacy of pulmonary                         ventilation, and response to care were monitored                         throughout the procedure. The physical status of the                         patient was re-assessed after the procedure.                        After obtaining informed consent, the endoscope was                         passed under direct vision. Throughout the procedure,                         the patient's blood pressure, pulse, and oxygen                         saturations were monitored continuously. The Endoscope                         was introduced through the mouth, and advanced to the                         second part of duodenum. The upper GI endoscopy was                         accomplished without difficulty. The patient  tolerated                         the procedure well. Findings:      While patient was intubated, the anesthesia team noted the foreign body       in the back of the throat. Using forceps, the anesthesia team removed       the foreign body. The EGD was performed for completeness sake.      The examined esophagus was normal.      The entire examined stomach was normal.      The examined duodenum was normal. Impression:            - Normal esophagus.                        - Normal stomach.                        - Normal examined duodenum.                        - No specimens collected. Recommendation:         - Discharge patient to home.                        - Resume previous diet.                        - Continue present medications. Procedure Code(s):     --- Professional ---                        [phone], Esophagogastroduodenoscopy, flexible,                         transoral; diagnostic, including collection of                         specimen(s) by brushing or washing, when performed                         (separate procedure) Diagnosis Code(s):     --- Professional ---                        T18.108A, Unspecified foreign body in esophagus                         causing other injury, initial encounter CPT copyright 2022 American Medical Association. All rights reserved. The codes documented in this report are preliminary and upon coder review may  be revised to meet current compliance requirements. Ole Schick MD, MD 06/25/2024 1:19:10 AM Number of Addenda: 0 Note Initiated On: 06/25/2024 12:06 AM Estimated Blood Loss:  Estimated blood loss: none.      [facility]

## 2024-06-25 NOTE — Anesthesia Preprocedure Evaluation (Addendum)
 Anesthesia Evaluation  Patient identified by MRN, date of birth, ID band Patient awake    Reviewed: Allergy & Precautions, NPO status , Patient's Chart, lab work & pertinent test results  History of Anesthesia Complications Negative for: history of anesthetic complications  Airway Mallampati: II   Neck ROM: Full    Dental  (+) Missing   Pulmonary neg pulmonary ROS   Pulmonary exam normal breath sounds clear to auscultation       Cardiovascular hypertension, +CHF (preserved EF)  Normal cardiovascular exam Rhythm:Regular Rate:Normal  ECG 06/24/24: Sinus rhythm Abnormal R-wave progression, early transition   Neuro/Psych Chronic pain  Neuromuscular disease (multiple sclerosis; wheelchair bound)    GI/Hepatic negative GI ROS,,,  Endo/Other  negative endocrine ROS    Renal/GU negative Renal ROS Bladder dysfunction (neurogenic bladder)      Musculoskeletal   Abdominal   Peds  Hematology negative hematology ROS (+)   Anesthesia Other Findings   Reproductive/Obstetrics                              Anesthesia Physical Anesthesia Plan  ASA: 3 and emergent  Anesthesia Plan: General   Post-op Pain Management:    Induction: Intravenous and Rapid sequence  PONV Risk Score and Plan: 3 and Ondansetron , Dexamethasone  and Treatment may vary due to age or medical condition  Airway Management Planned: Oral ETT  Additional Equipment:   Intra-op Plan:   Post-operative Plan: Extubation in OR  Informed Consent: I have reviewed the patients History and Physical, chart, labs and discussed the procedure including the risks, benefits and alternatives for the proposed anesthesia with the patient or authorized representative who has indicated his/her understanding and acceptance.     Dental advisory given  Plan Discussed with: CRNA  Anesthesia Plan Comments: (Anesthetic considerations for  multiple sclerosis: closely monitor body temperature to avoid increase above baseline, avoid succinylcholine, pt may have altered sensitivity to NDMRs.   Patient consented for risks of anesthesia including but not limited to:  - adverse reactions to medications - damage to eyes, teeth, lips or other oral mucosa - nerve damage due to positioning  - sore throat or hoarseness - damage to heart, brain, nerves, lungs, other parts of body or loss of life  Informed patient about role of CRNA in peri- and intra-operative care.  Patient voiced understanding.)         Anesthesia Quick Evaluation

## 2024-06-25 NOTE — ED Notes (Signed)
 Called Dora, patients mother to turn on the lights for when transport arrives

## 2024-06-25 NOTE — OR Nursing (Signed)
 Notified Dr Shellie re: hypertension continuing to climb post op. HR steady in the 120's. She ordered 10mg  Labetolol  and I gave it IV. HR came down to 95, BP 90/57

## 2024-06-25 NOTE — Transfer of Care (Signed)
 Immediate Anesthesia Transfer of Care Note  Patient: [name] Ada  Procedure(s) Performed: EGD (ESOPHAGOGASTRODUODENOSCOPY)  Patient Location: Endoscopy Unit  Anesthesia Type:General  Level of Consciousness: awake, alert , and oriented  Airway & Oxygen Therapy: Patient Spontanous Breathing  Post-op Assessment: Report given to RN and Post -op Vital signs reviewed and stable  Post vital signs: Reviewed  Last Vitals:  Vitals Value Taken Time  BP 154/78   Temp    Pulse 112   Resp 15   SpO2 100     Last Pain:         Complications: No notable events documented.

## 2024-06-26 DIAGNOSIS — G35 Multiple sclerosis: Principal | ICD-10-CM

## 2024-06-26 DIAGNOSIS — I10 Essential (primary) hypertension: Principal | ICD-10-CM

## 2024-06-26 MED ORDER — PREGABALIN 25 MG CAPSULE
ORAL_CAPSULE | Freq: Two times a day (BID) | ORAL | 0 refills | 90.00000 days | Status: CP
Start: 2024-06-26 — End: 2024-09-24

## 2024-06-26 MED ORDER — DULOXETINE 30 MG CAPSULE,DELAYED RELEASE
ORAL_CAPSULE | Freq: Every day | ORAL | 1 refills | 100.00000 days | Status: CP
Start: 2024-06-26 — End: ?

## 2024-06-26 MED ORDER — DULOXETINE 60 MG CAPSULE,DELAYED RELEASE
ORAL_CAPSULE | Freq: Every day | ORAL | 0 refills | 30.00000 days
Start: 2024-06-26 — End: ?

## 2024-06-26 MED ORDER — METOPROLOL SUCCINATE ER 25 MG TABLET,EXTENDED RELEASE 24 HR
ORAL_TABLET | Freq: Every day | ORAL | 0 refills | 90.00000 days | Status: CP
Start: 2024-06-26 — End: 2024-09-24

## 2024-06-29 ENCOUNTER — Inpatient Hospital Stay: Admit: 2024-06-29 | Discharge: 2024-06-30 | Payer: Medicare (Managed Care)

## 2024-06-29 DIAGNOSIS — G35D Multiple sclerosis: Principal | ICD-10-CM

## 2024-07-01 DIAGNOSIS — M62838 Other muscle spasm: Principal | ICD-10-CM

## 2024-07-01 DIAGNOSIS — G35B0 Primary progressive multiple sclerosis, unspecified: Principal | ICD-10-CM

## 2024-07-01 MED ORDER — CYCLOBENZAPRINE 5 MG TABLET
ORAL_TABLET | ORAL | 1 refills | 0.00000 days | Status: CP
Start: 2024-07-01 — End: ?

## 2024-07-25 ENCOUNTER — Encounter
Admit: 2024-07-25 | Discharge: 2024-07-25 | Payer: Medicare (Managed Care) | Attending: Internal Medicine | Primary: Internal Medicine

## 2024-07-25 DIAGNOSIS — Z0000 Encounter for general adult medical examination without abnormal findings: Principal | ICD-10-CM

## 2024-07-25 DIAGNOSIS — G35D Multiple sclerosis: Principal | ICD-10-CM

## 2024-07-25 DIAGNOSIS — G894 Chronic pain syndrome: Principal | ICD-10-CM

## 2024-07-25 DIAGNOSIS — Z9889 Other specified postprocedural states: Principal | ICD-10-CM

## 2024-07-25 DIAGNOSIS — H8110 Benign paroxysmal vertigo, unspecified ear: Principal | ICD-10-CM

## 2024-07-25 DIAGNOSIS — N319 Neuromuscular dysfunction of bladder, unspecified: Principal | ICD-10-CM

## 2024-07-25 DIAGNOSIS — Z1211 Encounter for screening for malignant neoplasm of colon: Principal | ICD-10-CM

## 2024-07-25 DIAGNOSIS — K219 Gastro-esophageal reflux disease without esophagitis: Principal | ICD-10-CM

## 2024-07-25 DIAGNOSIS — Z87898 Personal history of other specified conditions: Principal | ICD-10-CM

## 2024-07-25 DIAGNOSIS — R051 Acute cough: Principal | ICD-10-CM

## 2024-07-25 DIAGNOSIS — I10 Essential (primary) hypertension: Principal | ICD-10-CM

## 2024-07-25 DIAGNOSIS — Z789 Other specified health status: Principal | ICD-10-CM

## 2024-07-25 DIAGNOSIS — M792 Neuralgia and neuritis, unspecified: Principal | ICD-10-CM

## 2024-07-25 MED ORDER — METOPROLOL TARTRATE 50 MG TABLET
ORAL_TABLET | Freq: Two times a day (BID) | ORAL | 3 refills | 30.00000 days | Status: CP
Start: 2024-07-25 — End: 2024-11-22

## 2024-07-25 MED ORDER — FLUTICASONE PROPIONATE 50 MCG/ACTUATION NASAL SPRAY,SUSPENSION
Freq: Every day | NASAL | 1 refills | 120.00000 days | Status: CP
Start: 2024-07-25 — End: 2025-07-25

## 2024-07-29 DIAGNOSIS — G35D Multiple sclerosis: Principal | ICD-10-CM

## 2024-07-29 DIAGNOSIS — M792 Neuralgia and neuritis, unspecified: Principal | ICD-10-CM

## 2024-07-29 MED ORDER — PREGABALIN 100 MG CAPSULE
ORAL_CAPSULE | Freq: Two times a day (BID) | ORAL | 5 refills | 30.00000 days | Status: CP
Start: 2024-07-29 — End: 2025-01-25

## 2024-08-02 ENCOUNTER — Ambulatory Visit: Admit: 2024-08-02 | Discharge: 2024-08-03 | Payer: Medicare (Managed Care)

## 2024-08-14 ENCOUNTER — Ambulatory Visit: Admit: 2024-08-14 | Discharge: 2024-08-15 | Payer: Medicare (Managed Care)

## 2024-08-14 DIAGNOSIS — G894 Chronic pain syndrome: Principal | ICD-10-CM

## 2024-08-14 DIAGNOSIS — G35D Multiple sclerosis: Principal | ICD-10-CM

## 2024-08-21 DIAGNOSIS — G894 Chronic pain syndrome: Principal | ICD-10-CM

## 2024-08-21 DIAGNOSIS — G35D Multiple sclerosis: Principal | ICD-10-CM

## 2024-09-01 DIAGNOSIS — I10 Essential (primary) hypertension: Principal | ICD-10-CM

## 2024-09-01 MED ORDER — METOPROLOL SUCCINATE ER 25 MG TABLET,EXTENDED RELEASE 24 HR
ORAL_TABLET | Freq: Every day | ORAL | 0 refills | 0.00000 days
Start: 2024-09-01 — End: ?

## 2024-09-02 DIAGNOSIS — I10 Essential (primary) hypertension: Principal | ICD-10-CM

## 2024-09-02 MED ORDER — METOPROLOL SUCCINATE ER 25 MG TABLET,EXTENDED RELEASE 24 HR
ORAL_TABLET | Freq: Every day | ORAL | 0 refills | 90.00000 days
Start: 2024-09-02 — End: ?

## 2024-09-02 MED ORDER — METOPROLOL TARTRATE 50 MG TABLET
ORAL_TABLET | Freq: Two times a day (BID) | ORAL | 3 refills | 30.00000 days | Status: CP
Start: 2024-09-02 — End: 2024-12-31

## 2024-09-05 DIAGNOSIS — G894 Chronic pain syndrome: Principal | ICD-10-CM

## 2024-09-05 DIAGNOSIS — G35D Multiple sclerosis: Principal | ICD-10-CM

## 2024-09-06 ENCOUNTER — Ambulatory Visit: Admit: 2024-09-06 | Discharge: 2024-09-07 | Payer: Medicare (Managed Care)

## 2024-09-13 ENCOUNTER — Ambulatory Visit: Admit: 2024-09-13 | Discharge: 2024-10-12 | Payer: Medicare (Managed Care)

## 2024-09-13 ENCOUNTER — Ambulatory Visit: Admit: 2024-09-13 | Payer: Medicare (Managed Care)

## 2024-09-16 DIAGNOSIS — G35D Multiple sclerosis: Principal | ICD-10-CM

## 2024-09-18 DIAGNOSIS — G35D Multiple sclerosis: Principal | ICD-10-CM

## 2024-09-18 DIAGNOSIS — G894 Chronic pain syndrome: Principal | ICD-10-CM

## 2024-10-07 DIAGNOSIS — I10 Essential (primary) hypertension: Principal | ICD-10-CM

## 2024-10-07 MED ORDER — METOPROLOL TARTRATE 50 MG TABLET
ORAL_TABLET | Freq: Two times a day (BID) | ORAL | 1 refills | 0.00000 days
Start: 2024-10-07 — End: ?

## 2024-10-08 MED ORDER — METOPROLOL TARTRATE 50 MG TABLET
ORAL_TABLET | Freq: Two times a day (BID) | ORAL | 1 refills | 30.00000 days
Start: 2024-10-08 — End: ?

## 2024-10-09 DIAGNOSIS — G35D Multiple sclerosis: Principal | ICD-10-CM

## 2024-10-09 DIAGNOSIS — G894 Chronic pain syndrome: Principal | ICD-10-CM

## 2024-10-10 DIAGNOSIS — G35B0 Primary progressive multiple sclerosis, unspecified: Principal | ICD-10-CM

## 2024-10-10 DIAGNOSIS — R5382 Chronic fatigue, unspecified: Principal | ICD-10-CM

## 2024-10-10 MED ORDER — MODAFINIL 100 MG TABLET
ORAL_TABLET | Freq: Every day | ORAL | 2 refills | 30.00000 days | Status: CP
Start: 2024-10-10 — End: 2025-01-08

## 2024-10-11 DIAGNOSIS — Z9181 History of falling: Principal | ICD-10-CM

## 2024-10-11 DIAGNOSIS — G35D Multiple sclerosis: Principal | ICD-10-CM

## 2024-10-11 DIAGNOSIS — E66811 Obesity (BMI 30.0-34.9): Principal | ICD-10-CM

## 2024-10-11 DIAGNOSIS — I10 Essential (primary) hypertension: Principal | ICD-10-CM

## 2024-10-11 DIAGNOSIS — R29898 Other symptoms and signs involving the musculoskeletal system: Principal | ICD-10-CM

## 2024-10-11 DIAGNOSIS — G894 Chronic pain syndrome: Principal | ICD-10-CM

## 2024-10-28 DIAGNOSIS — G35D Multiple sclerosis: Secondary | ICD-10-CM

## 2024-10-28 DIAGNOSIS — G894 Chronic pain syndrome: Principal | ICD-10-CM

## 2024-10-31 DIAGNOSIS — I10 Essential (primary) hypertension: Secondary | ICD-10-CM

## 2024-10-31 DIAGNOSIS — L89144 Pressure ulcer of left lower back, stage 4: Secondary | ICD-10-CM

## 2024-10-31 DIAGNOSIS — M792 Neuralgia and neuritis, unspecified: Secondary | ICD-10-CM

## 2024-10-31 DIAGNOSIS — K219 Gastro-esophageal reflux disease without esophagitis: Secondary | ICD-10-CM

## 2024-10-31 DIAGNOSIS — G8220 Paraplegia, unspecified: Secondary | ICD-10-CM

## 2024-10-31 DIAGNOSIS — Z0000 Encounter for general adult medical examination without abnormal findings: Secondary | ICD-10-CM

## 2024-10-31 DIAGNOSIS — K5909 Other constipation: Secondary | ICD-10-CM

## 2024-10-31 DIAGNOSIS — I5032 Chronic diastolic (congestive) heart failure: Secondary | ICD-10-CM

## 2024-10-31 DIAGNOSIS — M24522 Contracture, left elbow: Secondary | ICD-10-CM

## 2024-10-31 DIAGNOSIS — G8194 Hemiplegia, unspecified affecting left nondominant side: Secondary | ICD-10-CM

## 2024-10-31 DIAGNOSIS — H8110 Benign paroxysmal vertigo, unspecified ear: Secondary | ICD-10-CM

## 2024-10-31 DIAGNOSIS — N319 Neuromuscular dysfunction of bladder, unspecified: Secondary | ICD-10-CM

## 2024-10-31 DIAGNOSIS — Z936 Other artificial openings of urinary tract status: Principal | ICD-10-CM

## 2024-10-31 DIAGNOSIS — L89323 Pressure ulcer of left buttock, stage 3: Secondary | ICD-10-CM

## 2024-10-31 DIAGNOSIS — G35D Multiple sclerosis: Secondary | ICD-10-CM

## 2024-10-31 MED ORDER — POLYETHYLENE GLYCOL 3350 17 GRAM ORAL POWDER PACKET
PACK | ORAL | 3 refills | 90.00000 days
Start: 2024-10-31 — End: ?
